# Patient Record
Sex: Male | Born: 1965
Health system: Southern US, Community
[De-identification: ages and names within clinical notes are randomized; demographics above are authoritative.]

## PROBLEM LIST (undated history)

## (undated) ENCOUNTER — Ambulatory Visit (HOSPITAL_COMMUNITY): Disposition: A | Discharge status: Home / Self Care | Payer: BC Managed Care – PPO

## (undated) DIAGNOSIS — I2510 Atherosclerotic heart disease of native coronary artery without angina pectoris: Secondary | ICD-10-CM

## (undated) DIAGNOSIS — F329 Major depressive disorder, single episode, unspecified: Secondary | ICD-10-CM

## (undated) DIAGNOSIS — F419 Anxiety disorder, unspecified: Secondary | ICD-10-CM

## (undated) DIAGNOSIS — F32A Depression, unspecified: Secondary | ICD-10-CM

## (undated) DIAGNOSIS — M1990 Unspecified osteoarthritis, unspecified site: Secondary | ICD-10-CM

## (undated) DIAGNOSIS — G44009 Cluster headache syndrome, unspecified, not intractable: Secondary | ICD-10-CM

## (undated) DIAGNOSIS — I2692 Saddle embolus of pulmonary artery without acute cor pulmonale: Secondary | ICD-10-CM

## (undated) DIAGNOSIS — I82409 Acute embolism and thrombosis of unspecified deep veins of unspecified lower extremity: Secondary | ICD-10-CM

## (undated) DIAGNOSIS — I10 Essential (primary) hypertension: Secondary | ICD-10-CM

## (undated) DIAGNOSIS — Z87442 Personal history of urinary calculi: Secondary | ICD-10-CM

## (undated) DIAGNOSIS — G4730 Sleep apnea, unspecified: Secondary | ICD-10-CM

## (undated) DIAGNOSIS — N200 Calculus of kidney: Secondary | ICD-10-CM

## (undated) DIAGNOSIS — R454 Irritability and anger: Secondary | ICD-10-CM

## (undated) DIAGNOSIS — M12819 Other specific arthropathies, not elsewhere classified, unspecified shoulder: Secondary | ICD-10-CM

## (undated) HISTORY — DX: Anxiety disorder, unspecified: F41.9

## (undated) HISTORY — DX: Saddle embolus of pulmonary artery without acute cor pulmonale: I26.92

## (undated) HISTORY — DX: Irritability and anger: R45.4

## (undated) HISTORY — DX: Calculus of kidney: N20.0

## (undated) HISTORY — DX: Cluster headache syndrome, unspecified, not intractable: G44.009

## (undated) HISTORY — PX: WISDOM TOOTH EXTRACTION: SHX21

## (undated) HISTORY — DX: Unspecified osteoarthritis, unspecified site: M19.90

## (undated) HISTORY — DX: Major depressive disorder, single episode, unspecified: F32.9

## (undated) HISTORY — DX: Essential (primary) hypertension: I10

## (undated) HISTORY — DX: Acute embolism and thrombosis of unspecified deep veins of unspecified lower extremity: I82.409

## (undated) HISTORY — PX: COLONOSCOPY: SHX174

## (undated) HISTORY — DX: Depression, unspecified: F32.A

---

## 1998-04-10 ENCOUNTER — Emergency Department (HOSPITAL_COMMUNITY): Admission: EM | Admit: 1998-04-10 | Discharge: 1998-04-10 | Payer: Self-pay | Admitting: Emergency Medicine

## 1999-11-24 ENCOUNTER — Emergency Department (HOSPITAL_COMMUNITY): Admission: EM | Admit: 1999-11-24 | Discharge: 1999-11-24 | Payer: Self-pay | Admitting: Emergency Medicine

## 2000-10-01 ENCOUNTER — Emergency Department (HOSPITAL_COMMUNITY): Admission: EM | Admit: 2000-10-01 | Discharge: 2000-10-01 | Payer: Self-pay | Admitting: Emergency Medicine

## 2000-10-01 ENCOUNTER — Encounter: Payer: Self-pay | Admitting: Emergency Medicine

## 2000-10-11 ENCOUNTER — Encounter: Payer: Self-pay | Admitting: Emergency Medicine

## 2000-10-11 ENCOUNTER — Emergency Department (HOSPITAL_COMMUNITY): Admission: EM | Admit: 2000-10-11 | Discharge: 2000-10-11 | Payer: Self-pay | Admitting: Emergency Medicine

## 2002-03-07 ENCOUNTER — Encounter: Payer: Self-pay | Admitting: Emergency Medicine

## 2002-03-07 ENCOUNTER — Observation Stay (HOSPITAL_COMMUNITY): Admission: EM | Admit: 2002-03-07 | Discharge: 2002-03-08 | Payer: Self-pay | Admitting: Emergency Medicine

## 2002-04-18 ENCOUNTER — Emergency Department (HOSPITAL_COMMUNITY): Admission: EM | Admit: 2002-04-18 | Discharge: 2002-04-18 | Payer: Self-pay | Admitting: Emergency Medicine

## 2002-08-01 ENCOUNTER — Emergency Department (HOSPITAL_COMMUNITY): Admission: EM | Admit: 2002-08-01 | Discharge: 2002-08-01 | Payer: Self-pay | Admitting: Emergency Medicine

## 2002-08-01 ENCOUNTER — Encounter: Payer: Self-pay | Admitting: *Deleted

## 2002-10-17 ENCOUNTER — Encounter: Payer: Self-pay | Admitting: Emergency Medicine

## 2002-10-17 ENCOUNTER — Emergency Department (HOSPITAL_COMMUNITY): Admission: EM | Admit: 2002-10-17 | Discharge: 2002-10-17 | Payer: Self-pay | Admitting: Emergency Medicine

## 2002-10-29 ENCOUNTER — Encounter: Payer: Self-pay | Admitting: Emergency Medicine

## 2002-10-29 ENCOUNTER — Ambulatory Visit (HOSPITAL_COMMUNITY): Admission: RE | Admit: 2002-10-29 | Discharge: 2002-10-29 | Payer: Self-pay | Admitting: Emergency Medicine

## 2004-09-08 ENCOUNTER — Emergency Department (HOSPITAL_COMMUNITY): Admission: EM | Admit: 2004-09-08 | Discharge: 2004-09-08 | Payer: Self-pay | Admitting: Emergency Medicine

## 2005-03-10 ENCOUNTER — Emergency Department (HOSPITAL_COMMUNITY): Admission: EM | Admit: 2005-03-10 | Discharge: 2005-03-10 | Payer: Self-pay | Admitting: Emergency Medicine

## 2005-04-26 ENCOUNTER — Emergency Department (HOSPITAL_COMMUNITY): Admission: EM | Admit: 2005-04-26 | Discharge: 2005-04-26 | Payer: Self-pay | Admitting: Emergency Medicine

## 2005-06-02 DIAGNOSIS — M12819 Other specific arthropathies, not elsewhere classified, unspecified shoulder: Secondary | ICD-10-CM

## 2005-06-02 HISTORY — PX: CARDIAC CATHETERIZATION: SHX172

## 2005-06-02 HISTORY — PX: ROTATOR CUFF REPAIR: SHX139

## 2005-06-02 HISTORY — DX: Other specific arthropathies, not elsewhere classified, unspecified shoulder: M12.819

## 2005-06-02 HISTORY — PX: UPPER GASTROINTESTINAL ENDOSCOPY: SHX188

## 2005-09-26 ENCOUNTER — Emergency Department (HOSPITAL_COMMUNITY): Admission: EM | Admit: 2005-09-26 | Discharge: 2005-09-26 | Payer: Self-pay | Admitting: Emergency Medicine

## 2005-09-30 ENCOUNTER — Emergency Department (HOSPITAL_COMMUNITY): Admission: EM | Admit: 2005-09-30 | Discharge: 2005-09-30 | Payer: Self-pay | Admitting: Emergency Medicine

## 2005-10-18 ENCOUNTER — Emergency Department (HOSPITAL_COMMUNITY): Admission: EM | Admit: 2005-10-18 | Discharge: 2005-10-19 | Payer: Self-pay | Admitting: Emergency Medicine

## 2005-11-04 ENCOUNTER — Ambulatory Visit: Payer: Self-pay | Admitting: Internal Medicine

## 2005-11-18 ENCOUNTER — Ambulatory Visit: Payer: Self-pay

## 2005-12-12 ENCOUNTER — Ambulatory Visit: Payer: Self-pay | Admitting: Internal Medicine

## 2005-12-25 ENCOUNTER — Observation Stay (HOSPITAL_COMMUNITY): Admission: EM | Admit: 2005-12-25 | Discharge: 2005-12-26 | Payer: Self-pay | Admitting: Emergency Medicine

## 2005-12-26 ENCOUNTER — Ambulatory Visit: Payer: Self-pay | Admitting: Cardiology

## 2005-12-31 ENCOUNTER — Ambulatory Visit: Payer: Self-pay | Admitting: Internal Medicine

## 2006-01-12 ENCOUNTER — Emergency Department (HOSPITAL_COMMUNITY): Admission: EM | Admit: 2006-01-12 | Discharge: 2006-01-12 | Payer: Self-pay | Admitting: Emergency Medicine

## 2006-01-14 ENCOUNTER — Ambulatory Visit: Payer: Self-pay | Admitting: Internal Medicine

## 2006-01-21 ENCOUNTER — Ambulatory Visit: Payer: Self-pay | Admitting: Gastroenterology

## 2006-01-26 ENCOUNTER — Ambulatory Visit: Payer: Self-pay | Admitting: Gastroenterology

## 2006-01-28 ENCOUNTER — Ambulatory Visit: Payer: Self-pay | Admitting: Cardiology

## 2006-01-29 ENCOUNTER — Ambulatory Visit: Payer: Self-pay | Admitting: Cardiology

## 2006-01-30 ENCOUNTER — Ambulatory Visit: Payer: Self-pay | Admitting: Internal Medicine

## 2006-01-30 ENCOUNTER — Inpatient Hospital Stay (HOSPITAL_BASED_OUTPATIENT_CLINIC_OR_DEPARTMENT_OTHER): Admission: RE | Admit: 2006-01-30 | Discharge: 2006-01-30 | Payer: Self-pay | Admitting: Internal Medicine

## 2006-02-03 ENCOUNTER — Ambulatory Visit: Payer: Self-pay | Admitting: Internal Medicine

## 2006-02-18 ENCOUNTER — Ambulatory Visit: Payer: Self-pay | Admitting: Cardiology

## 2006-02-27 ENCOUNTER — Emergency Department (HOSPITAL_COMMUNITY): Admission: EM | Admit: 2006-02-27 | Discharge: 2006-02-27 | Payer: Self-pay | Admitting: *Deleted

## 2006-03-03 ENCOUNTER — Ambulatory Visit: Payer: Self-pay | Admitting: Internal Medicine

## 2006-03-10 ENCOUNTER — Ambulatory Visit: Payer: Self-pay | Admitting: Gastroenterology

## 2006-03-16 ENCOUNTER — Ambulatory Visit: Payer: Self-pay | Admitting: Cardiology

## 2006-04-06 ENCOUNTER — Ambulatory Visit (HOSPITAL_COMMUNITY): Admission: RE | Admit: 2006-04-06 | Discharge: 2006-04-06 | Payer: Self-pay | Admitting: Gastroenterology

## 2006-06-28 ENCOUNTER — Emergency Department (HOSPITAL_COMMUNITY): Admission: EM | Admit: 2006-06-28 | Discharge: 2006-06-29 | Payer: Self-pay | Admitting: Emergency Medicine

## 2006-08-24 ENCOUNTER — Ambulatory Visit: Payer: Self-pay | Admitting: Internal Medicine

## 2007-01-12 ENCOUNTER — Ambulatory Visit: Payer: Self-pay | Admitting: Internal Medicine

## 2007-01-12 DIAGNOSIS — R079 Chest pain, unspecified: Secondary | ICD-10-CM | POA: Insufficient documentation

## 2007-01-21 ENCOUNTER — Encounter (INDEPENDENT_AMBULATORY_CARE_PROVIDER_SITE_OTHER): Payer: Self-pay | Admitting: *Deleted

## 2007-01-21 LAB — CONVERTED CEMR LAB
Chloride: 107 meq/L (ref 96–112)
Creatinine, Ser: 0.9 mg/dL (ref 0.4–1.5)
Potassium: 4.3 meq/L (ref 3.5–5.1)

## 2007-01-28 ENCOUNTER — Ambulatory Visit: Payer: Self-pay | Admitting: Internal Medicine

## 2007-01-28 DIAGNOSIS — N529 Male erectile dysfunction, unspecified: Secondary | ICD-10-CM | POA: Insufficient documentation

## 2007-01-28 DIAGNOSIS — F911 Conduct disorder, childhood-onset type: Secondary | ICD-10-CM | POA: Insufficient documentation

## 2007-02-01 ENCOUNTER — Encounter: Payer: Self-pay | Admitting: Internal Medicine

## 2007-02-01 LAB — CONVERTED CEMR LAB: Testosterone: 244.38 ng/dL — ABNORMAL LOW (ref 350.00–890)

## 2007-02-02 ENCOUNTER — Telehealth (INDEPENDENT_AMBULATORY_CARE_PROVIDER_SITE_OTHER): Payer: Self-pay | Admitting: *Deleted

## 2007-02-02 ENCOUNTER — Encounter (INDEPENDENT_AMBULATORY_CARE_PROVIDER_SITE_OTHER): Payer: Self-pay | Admitting: *Deleted

## 2007-02-03 ENCOUNTER — Ambulatory Visit: Payer: Self-pay | Admitting: Internal Medicine

## 2007-02-17 ENCOUNTER — Ambulatory Visit: Payer: Self-pay

## 2007-02-17 ENCOUNTER — Encounter: Payer: Self-pay | Admitting: Internal Medicine

## 2007-02-17 ENCOUNTER — Encounter: Payer: Self-pay | Admitting: Cardiology

## 2007-02-22 ENCOUNTER — Encounter: Payer: Self-pay | Admitting: Internal Medicine

## 2007-03-09 ENCOUNTER — Ambulatory Visit: Payer: Self-pay | Admitting: Internal Medicine

## 2007-03-09 DIAGNOSIS — D1779 Benign lipomatous neoplasm of other sites: Secondary | ICD-10-CM | POA: Insufficient documentation

## 2007-03-31 ENCOUNTER — Ambulatory Visit: Payer: Self-pay | Admitting: Cardiology

## 2007-07-16 ENCOUNTER — Ambulatory Visit: Payer: Self-pay | Admitting: Internal Medicine

## 2007-07-16 DIAGNOSIS — M545 Low back pain, unspecified: Secondary | ICD-10-CM | POA: Insufficient documentation

## 2007-07-29 ENCOUNTER — Telehealth: Payer: Self-pay | Admitting: Internal Medicine

## 2007-07-29 ENCOUNTER — Encounter: Payer: Self-pay | Admitting: Internal Medicine

## 2007-07-30 ENCOUNTER — Telehealth (INDEPENDENT_AMBULATORY_CARE_PROVIDER_SITE_OTHER): Payer: Self-pay | Admitting: *Deleted

## 2007-08-03 ENCOUNTER — Telehealth (INDEPENDENT_AMBULATORY_CARE_PROVIDER_SITE_OTHER): Payer: Self-pay | Admitting: *Deleted

## 2007-08-03 ENCOUNTER — Encounter (INDEPENDENT_AMBULATORY_CARE_PROVIDER_SITE_OTHER): Payer: Self-pay | Admitting: *Deleted

## 2007-08-05 ENCOUNTER — Encounter (INDEPENDENT_AMBULATORY_CARE_PROVIDER_SITE_OTHER): Payer: Self-pay | Admitting: *Deleted

## 2007-09-09 ENCOUNTER — Emergency Department (HOSPITAL_COMMUNITY): Admission: EM | Admit: 2007-09-09 | Discharge: 2007-09-09 | Payer: Self-pay | Admitting: Emergency Medicine

## 2007-09-19 ENCOUNTER — Emergency Department (HOSPITAL_COMMUNITY): Admission: EM | Admit: 2007-09-19 | Discharge: 2007-09-20 | Payer: Self-pay | Admitting: Emergency Medicine

## 2007-09-20 ENCOUNTER — Encounter: Payer: Self-pay | Admitting: Internal Medicine

## 2007-11-24 ENCOUNTER — Telehealth (INDEPENDENT_AMBULATORY_CARE_PROVIDER_SITE_OTHER): Payer: Self-pay | Admitting: *Deleted

## 2008-02-04 ENCOUNTER — Telehealth (INDEPENDENT_AMBULATORY_CARE_PROVIDER_SITE_OTHER): Payer: Self-pay | Admitting: *Deleted

## 2008-02-08 ENCOUNTER — Telehealth (INDEPENDENT_AMBULATORY_CARE_PROVIDER_SITE_OTHER): Payer: Self-pay | Admitting: *Deleted

## 2008-02-21 ENCOUNTER — Emergency Department (HOSPITAL_COMMUNITY): Admission: EM | Admit: 2008-02-21 | Discharge: 2008-02-21 | Payer: Self-pay | Admitting: Emergency Medicine

## 2008-06-29 ENCOUNTER — Telehealth (INDEPENDENT_AMBULATORY_CARE_PROVIDER_SITE_OTHER): Payer: Self-pay | Admitting: *Deleted

## 2008-07-06 ENCOUNTER — Ambulatory Visit: Payer: Self-pay | Admitting: Internal Medicine

## 2008-07-06 DIAGNOSIS — I10 Essential (primary) hypertension: Secondary | ICD-10-CM | POA: Insufficient documentation

## 2008-07-06 DIAGNOSIS — E782 Mixed hyperlipidemia: Secondary | ICD-10-CM | POA: Insufficient documentation

## 2008-07-11 ENCOUNTER — Telehealth (INDEPENDENT_AMBULATORY_CARE_PROVIDER_SITE_OTHER): Payer: Self-pay | Admitting: *Deleted

## 2008-07-14 ENCOUNTER — Telehealth (INDEPENDENT_AMBULATORY_CARE_PROVIDER_SITE_OTHER): Payer: Self-pay | Admitting: *Deleted

## 2008-07-17 ENCOUNTER — Telehealth (INDEPENDENT_AMBULATORY_CARE_PROVIDER_SITE_OTHER): Payer: Self-pay | Admitting: *Deleted

## 2008-07-26 ENCOUNTER — Telehealth (INDEPENDENT_AMBULATORY_CARE_PROVIDER_SITE_OTHER): Payer: Self-pay | Admitting: *Deleted

## 2008-10-12 ENCOUNTER — Telehealth (INDEPENDENT_AMBULATORY_CARE_PROVIDER_SITE_OTHER): Payer: Self-pay | Admitting: *Deleted

## 2008-11-03 ENCOUNTER — Ambulatory Visit: Payer: Self-pay | Admitting: Internal Medicine

## 2008-11-03 LAB — CONVERTED CEMR LAB
BUN: 16 mg/dL (ref 6–23)
Hgb A1c MFr Bld: 6.1 % (ref 4.6–6.5)

## 2008-11-06 ENCOUNTER — Encounter: Payer: Self-pay | Admitting: Internal Medicine

## 2008-11-10 ENCOUNTER — Ambulatory Visit: Payer: Self-pay | Admitting: Internal Medicine

## 2008-11-10 DIAGNOSIS — R739 Hyperglycemia, unspecified: Secondary | ICD-10-CM | POA: Insufficient documentation

## 2008-11-10 LAB — CONVERTED CEMR LAB
Cholesterol, target level: 200 mg/dL
LDL Goal: 120 mg/dL

## 2008-12-10 ENCOUNTER — Emergency Department (HOSPITAL_COMMUNITY): Admission: EM | Admit: 2008-12-10 | Discharge: 2008-12-10 | Payer: Self-pay | Admitting: Emergency Medicine

## 2009-01-19 ENCOUNTER — Encounter (INDEPENDENT_AMBULATORY_CARE_PROVIDER_SITE_OTHER): Payer: Self-pay | Admitting: *Deleted

## 2009-01-19 ENCOUNTER — Ambulatory Visit: Payer: Self-pay | Admitting: Internal Medicine

## 2009-01-19 DIAGNOSIS — T887XXA Unspecified adverse effect of drug or medicament, initial encounter: Secondary | ICD-10-CM | POA: Insufficient documentation

## 2009-01-23 ENCOUNTER — Telehealth: Payer: Self-pay | Admitting: Internal Medicine

## 2009-01-23 LAB — CONVERTED CEMR LAB
AST: 20 units/L (ref 0–37)
Cholesterol: 126 mg/dL (ref 0–200)
Hgb A1c MFr Bld: 6.1 % (ref 4.6–6.5)
LDL Cholesterol: 84 mg/dL (ref 0–99)
VLDL: 7.8 mg/dL (ref 0.0–40.0)

## 2009-01-24 ENCOUNTER — Encounter (INDEPENDENT_AMBULATORY_CARE_PROVIDER_SITE_OTHER): Payer: Self-pay | Admitting: *Deleted

## 2009-01-29 ENCOUNTER — Telehealth (INDEPENDENT_AMBULATORY_CARE_PROVIDER_SITE_OTHER): Payer: Self-pay | Admitting: *Deleted

## 2009-01-30 ENCOUNTER — Telehealth (INDEPENDENT_AMBULATORY_CARE_PROVIDER_SITE_OTHER): Payer: Self-pay | Admitting: *Deleted

## 2009-06-21 ENCOUNTER — Ambulatory Visit: Payer: Self-pay | Admitting: Internal Medicine

## 2009-06-21 LAB — CONVERTED CEMR LAB
Bilirubin Urine: NEGATIVE
Blood in Urine, dipstick: NEGATIVE
Glucose, Urine, Semiquant: NEGATIVE
Ketones, urine, test strip: NEGATIVE
Nitrite: NEGATIVE
Protein, U semiquant: NEGATIVE
Specific Gravity, Urine: 1.02
Urobilinogen, UA: NEGATIVE
WBC Urine, dipstick: NEGATIVE
pH: 5

## 2009-06-22 LAB — CONVERTED CEMR LAB
ALT: 43 units/L (ref 0–53)
AST: 23 units/L (ref 0–37)
Alkaline Phosphatase: 44 units/L (ref 39–117)
BUN: 10 mg/dL (ref 6–23)
Cholesterol: 139 mg/dL (ref 0–200)
HDL: 37.4 mg/dL — ABNORMAL LOW (ref >39.00)
LDL Cholesterol: 90 mg/dL (ref 0–99)
Total Bilirubin: 1 mg/dL (ref 0.3–1.2)
Total CHOL/HDL Ratio: 4
Triglycerides: 58 mg/dL (ref 0.0–149.0)
VLDL: 11.6 mg/dL (ref 0.0–40.0)

## 2009-07-09 ENCOUNTER — Emergency Department (HOSPITAL_COMMUNITY): Admission: EM | Admit: 2009-07-09 | Discharge: 2009-07-09 | Payer: Self-pay | Admitting: Emergency Medicine

## 2009-11-15 ENCOUNTER — Emergency Department (HOSPITAL_COMMUNITY): Admission: EM | Admit: 2009-11-15 | Discharge: 2009-11-15 | Payer: Self-pay | Admitting: Family Medicine

## 2009-11-19 ENCOUNTER — Emergency Department (HOSPITAL_COMMUNITY): Admission: EM | Admit: 2009-11-19 | Discharge: 2009-11-19 | Payer: Self-pay | Admitting: Emergency Medicine

## 2010-01-07 ENCOUNTER — Telehealth (INDEPENDENT_AMBULATORY_CARE_PROVIDER_SITE_OTHER): Payer: Self-pay | Admitting: *Deleted

## 2010-01-24 ENCOUNTER — Emergency Department (HOSPITAL_COMMUNITY): Admission: EM | Admit: 2010-01-24 | Discharge: 2010-01-24 | Payer: Self-pay | Admitting: Emergency Medicine

## 2010-06-04 ENCOUNTER — Ambulatory Visit: Admit: 2010-06-04 | Payer: Self-pay | Admitting: Internal Medicine

## 2010-06-23 ENCOUNTER — Encounter: Payer: Self-pay | Admitting: Cardiology

## 2010-07-02 NOTE — Assessment & Plan Note (Signed)
Summary: prostate exam   Vital Signs:  Patient profile:   45 year old male Weight:      234.8 pounds Temp:     97.8 degrees F oral Pulse rate:   64 / minute Resp:     15 per minute BP sitting:   114 / 80  (left arm) Cuff size:   large  Vitals Entered By: Shonna Chock (June 21, 2009 11:21 AM) CC: Patient would like a prostate exam, Hypertension Management Comments REVIEWED MED LIST, PATIENT AGREED DOSE AND INSTRUCTION CORRECT    CC:  Patient would like a prostate exam and Hypertension Management.  History of Present Illness: He thought the prostate should be examined regularly. He has no PMH of GU  disease. No FH of prostate CA. Some ED ; no decreased libido. BP not checked.  Hypertension History:      He complains of chest pain and peripheral edema, but denies headache, palpitations, dyspnea with exertion, orthopnea, PND, visual symptoms, neurologic problems, syncope, and side effects from treatment.  He notes no problems with any antihypertensive medication side effects.  Chest pain is unchanged ; it has been evaluated extensively including cath.        Positive major cardiovascular risk factors include hyperlipidemia and hypertension.  Negative major cardiovascular risk factors include male age less than 37 years old, no history of diabetes, negative family history for ischemic heart disease, and non-tobacco-user status.        Further assessment for target organ damage reveals no history of ASHD, stroke/TIA, or peripheral vascular disease.     Allergies (verified): No Known Drug Allergies  Review of Systems General:  Denies chills, fever, sweats, and weight loss. Eyes:  Denies blurring, double vision, and vision loss-both eyes. ENT:  [place] epistaxis. CV:  Complains of swelling of feet; denies leg cramps with exertion. GU:  Complains of erectile dysfunction and nocturia; denies decreased libido, discharge, dysuria, genital sores, hematuria, urinary frequency, and urinary  hesitancy; nOCTURIA 2-3x/ NIGHT. MS:  Complains of joint pain; denies joint redness and joint swelling; Knee pain; no Rx. S/P steroid njections. Derm:  Denies poor wound healing. Neuro:  Denies numbness and tingling. Endo:  Denies excessive hunger, excessive thirst, and excessive urination.  Physical Exam  General:  well-nourished,in no acute distress; alert,appropriate and cooperative throughout examination Neck:  No deformities, masses, or tenderness noted. Lungs:  Normal respiratory effort, chest expands symmetrically. Lungs are clear to auscultation, no crackles or wheezes. Heart:  Normal rate and regular rhythm. S1 and S2 normal without gallop, murmur, click, rub . S4 Abdomen:  Bowel sounds positive,abdomen soft and non-tender without masses, organomegaly or hernias noted. Rectal:  No external abnormalities noted. Normal sphincter tone. No rectal masses or tenderness. Genitalia:  Testes bilaterally descended without nodularity, tenderness or masses. No scrotal masses or lesions. No penis lesions or urethral discharge.L varicocele.   Prostate:  Prostate gland firm and smooth, no enlargement, nodularity, tenderness, mass, asymmetry or induration. Pulses:  R and L carotid,radial,dorsalis pedis and posterior tibial pulses are full and equal bilaterally Extremities:  No clubbing, cyanosis, edema. Pes planus Neurologic:  alert & oriented X3 and DTRs symmetrical and normal.   Skin:  Intact without suspicious lesions or rashes Psych:  flat affect and subdued.     Impression & Recommendations:  Problem # 1:  HYPERTENSION (ICD-401.9)  His updated medication list for this problem includes:    Verapamil Hcl Cr 180 Mg Cp24 (Verapamil hcl) .[name]... 1 bid    Lisinopril  20 Mg Tabs (Lisinopril) .[name] KitchenMarland KitchenMarland KitchenMarland Kitchen 1bid    Hydrochlorothiazide 12.5 Mg Caps (Hydrochlorothiazide) .[name]... 1 once daily  Orders: Venipuncture (16109) TLB-Creatinine, Blood (82565-CREA) TLB-Potassium (K+) (84132-K) TLB-BUN (Urea  Nitrogen) (84520-BUN)  Problem # 2:  HYPERLIPIDEMIA (ICD-272.4)  His updated medication list for this problem includes:    Pravastatin Sodium 20 Mg Tabs (Pravastatin sodium) .[name]... 1 at bedtime  Orders: Venipuncture (60454) TLB-Lipid Panel (80061-LIPID) TLB-Hepatic/Liver Function Pnl (80076-HEPATIC)  Problem # 3:  FASTING HYPERGLYCEMIA (ICD-790.29)  Orders: Venipuncture (09811) TLB-A1C / Hgb A1C (Glycohemoglobin) (83036-A1C)  Problem # 4:  ERECTILE DYSFUNCTION, ORGANIC (ICD-607.84)  His updated medication list for this problem includes:    Levitra 20 Mg Tabs (Vardenafil hcl)  Orders: UA Dipstick W/ Micro (manual) (91478) Venipuncture (29562) TLB-TSH (Thyroid Stimulating Hormone) (84443-TSH) TLB-A1C / Hgb A1C (Glycohemoglobin) (83036-A1C) TLB-Testosterone, Total (84403-TESTO)  Complete Medication List: 1)  Verapamil Hcl Cr 180 Mg Cp24 (Verapamil hcl) .[name].. 1 bid 2)  Lisinopril 20 Mg Tabs (Lisinopril) .[name].. 1bid 3)  Levitra 20 Mg Tabs (Vardenafil hcl) 4)  Hydrochlorothiazide 12.5 Mg Caps (Hydrochlorothiazide) .[name].. 1 once daily 5)  Pravastatin Sodium 20 Mg Tabs (Pravastatin sodium) .[name].. 1 at bedtime  Hypertension Assessment/Plan:      The patient's hypertensive risk group is category B: At least one risk factor (excluding diabetes) with no target organ damage.  His calculated 10 year risk of coronary heart disease is 4 %.  Today's blood pressure is 114/80.    Patient Instructions: 1)  Check your Blood Pressure regularly. If it is above:  135/85 ON AVERAGE you should make an appointment.  Laboratory Results   Urine Tests   Date/Time Reported: June 21, 2009 11:17 AM   Routine Urinalysis   Color: yellow Appearance: Clear Glucose: negative   (Normal Range: Negative) Bilirubin: negative   (Normal Range: Negative) Ketone: negative   (Normal Range: Negative) Spec. Gravity: 1.020   (Normal Range: 1.003-1.035) Blood: negative   (Normal Range: Negative) pH: 5.0   (Normal  Range: 5.0-8.0) Protein: negative   (Normal Range: Negative) Urobilinogen: negative   (Normal Range: 0-1) Nitrite: negative   (Normal Range: Negative) Leukocyte Esterace: negative   (Normal Range: Negative)    Comments: Floydene Flock  June 21, 2009 11:17 AM

## 2010-07-02 NOTE — Progress Notes (Signed)
Summary: refill  Phone Note Refill Request Call back at [phone] Message from:  Patient  Refills Requested: Medication #1:  VERAPAMIL HCL CR 180 MG  CP24 1 bid  Medication #2:  LISINOPRIL 20 MG  TABS 1bid patient using new pharmacy - [facility] [place]  Initial call taken by: Okey Regal [place],  January 07, 2010 3:33 PM    Prescriptions: LISINOPRIL 20 MG  TABS (LISINOPRIL) 1bid  #180 x 2   Entered by:   Shonna Chock CMA   Authorized by:   Marga Melnick MD   Signed by:   Shonna Chock CMA on 01/07/2010   Method used:   Electronically to        [facility] [phone]* (retail)       [address]       [place], [place]  [address]       Ph: [phone]       Fax: [phone]   RxID:   [id number] VERAPAMIL HCL CR 180 MG  CP24 (VERAPAMIL HCL) 1 bid  #180 x 2   Entered by:   Shonna Chock CMA   Authorized by:   Marga Melnick MD   Signed by:   Shonna Chock CMA on 01/07/2010   Method used:   Electronically to        [facility] [phone]* (retail)       [address]       [place], [place]  [address]       Ph: [phone]       Fax: [phone]   RxID:   [id number]

## 2010-08-18 LAB — HEMOCCULT GUIAC POC 1CARD (OFFICE): Fecal Occult Bld: NEGATIVE

## 2010-09-08 LAB — COMPREHENSIVE METABOLIC PANEL
ALT: 34 U/L (ref 0–53)
AST: 22 U/L (ref 0–37)
Albumin: 3.7 g/dL (ref 3.5–5.2)
BUN: 17 mg/dL (ref 6–23)
CO2: 25 mEq/L (ref 19–32)
Calcium: 9.2 mg/dL (ref 8.4–10.5)
Chloride: 103 mEq/L (ref 96–112)
Creatinine, Ser: 0.93 mg/dL (ref 0.4–1.5)
GFR calc Af Amer: >60 mL/min (ref >60)
GFR calc non Af Amer: >60 mL/min (ref >60)
Glucose, Bld: 115 mg/dL — ABNORMAL HIGH (ref 70–99)
Sodium: 137 mEq/L (ref 135–145)
Total Bilirubin: 0.5 mg/dL (ref 0.3–1.2)

## 2010-09-08 LAB — DIFFERENTIAL
Basophils Absolute: 0.1 10*3/uL (ref 0.0–0.1)
Basophils Relative: 1 % (ref 0–1)
Eosinophils Relative: 3 % (ref 0–5)
Neutrophils Relative %: 66 % (ref 43–77)

## 2010-09-08 LAB — LIPASE, BLOOD: Lipase: 34 U/L (ref 11–59)

## 2010-09-08 LAB — CBC
Hemoglobin: 14.5 g/dL (ref 13.0–17.0)
RDW: 13.1 % (ref 11.5–15.5)

## 2010-09-08 LAB — RAPID URINE DRUG SCREEN, HOSP PERFORMED
Opiates: NOT DETECTED
Tetrahydrocannabinol: NOT DETECTED

## 2010-10-15 NOTE — Assessment & Plan Note (Signed)
[facility]                                 ON-CALL NOTE   NAME:[name], [name]                   MRN:          [id number]  DATE:01/29/2007                            DOB:          06/11/1965    [phone]   Patient of Dr.  Frederik Pear.   Patient calling because Dr. Alwyn Ren wrote him a prescription for Levitra  but did not put his name on the prescription, the pharmacy will not fill  it.  Call Dr. Alwyn Ren on Tuesday.     Jeffrey A. Tawanna Cooler, MD  Electronically Signed    JAT/MedQ  DD: 01/30/2007  DT: 01/31/2007  Job #: [id number]

## 2010-10-15 NOTE — Assessment & Plan Note (Signed)
[facility]                            CARDIOLOGY OFFICE NOTE   NAME:[name], [name]                   MRN:          [id number]  DATE:02/03/2007                            DOB:          1965/12/08    This is a 45 year old African-American male patient of Dr. Jens Som who  has a history of atypical chest pain.  He underwent cardiac  catheterization by Dr. Gala Romney on January 30, 2006, which revealed no  obstructive coronary artery disease; however, there was TIMI II flow on  a PDA, consistent with possible microvascular disease.  He was asked to  continue on his verapamil.  His ejection fraction was 47% with mild  global hypokinesis that was felt related to alcohol use in the past.  He  was placed on lisinopril for this.   The patient presents with a 2-3 week history of recurrent, sharp,  shooting left-sided chest pain that goes up into his neck and is  proceeded by a tightening.  These episodes last off and on for 10  minutes and abate and return for a total of 30 minutes.  He denies any  associated shortness of breath, palpitations, dizziness, or presyncope.  They are not induced by exercise.  He usually has them while sitting  down, and yesterday he had one while he was driving.  He did not pull  over and stop the car, it was not severe enough, and proceeded home.  He  currently is on disability because of left shoulder surgery, and is out  for another month.  He denies any alcohol use or abuse and is a  nonsmoker.  He has no history of cholesterol problems.  He says his  brother has some heart problems and was asked about getting a  defibrillator but denied it.   CURRENT MEDICATIONS:  1. Verapamil 180 mg b.i.d.  2. Hydrocodone 7.5 p.r.n.  3. Buspirone/HCL 7.5 mg b.i.d.  4. Lisinopril 20 mg b.i.d.   PHYSICAL EXAMINATION:  This is a pleasant 45 year old African-American  male who is overweight.  Blood pressure is 148/80, pulse 87, weight  241.  NECK:  Without JVD, HJR, bruit, or thyroid enlargement.  LUNGS:  Clear, anterior, posterior, and lateral.  HEART:  Regular rate and rhythm at 87 beats per minute.  Normal S1 and  S2.  Positive S4 gallop.  No murmur, rub, bruit, thrill, or heave noted.  ABDOMEN:  Soft without organomegaly, masses, lesions, or abnormal  tenderness.  EXTREMITIES:  Without clubbing, cyanosis or edema.  There are good  distal pulses.   EKG:  Normal sinus rhythm.  No acute change.   IMPRESSION:  1. Ongoing atypical chest pain.  2. Hypertension.  3. Question of microvascular disease with TIMI II flow on posterior      descending artery on cardiac catheterization in August, 2007,      otherwise nonobstructive coronary artery disease.  4. Mild left ventricular dysfunction, felt related to alcohol use in      the past. Ejection fraction 47%.  5. History of cluster headaches.  6. History of mildly elevated liver functions with SGPT of  52.  7. Recent left shoulder surgery.   PLAN:  Dr. Ludwig Clarks note had recommended follow-up 2D echo within a  year, which is due now.  Because of his recurrent symptoms, I will also  order a stress Myoview.  He will see Dr. Jens Som back after these tests  are performed within the next month.      Jacolyn Reedy, PA-C  Electronically Signed      Bevelyn Buckles. Bensimhon, MD  Electronically Signed   ML/MedQ  DD: 02/03/2007  DT: 02/03/2007  Job #: [id number]   cc:   Titus Dubin. Alwyn Ren, MD,FACP,FCCP

## 2010-10-15 NOTE — Assessment & Plan Note (Signed)
[facility]                            CARDIOLOGY OFFICE NOTE   NAME:Loh, JUANJOSE MOJICA                   MRN:          [id number]  DATE:03/31/2007                            DOB:          Aug 29, 1965    Mr. Smelser is a 45 year old gentleman who I have seen in the past for  atypical chest pain.  At catheterization in August 2007 revealed no  obstructive coronary disease.  There was TIMI 2 flow in the PDA with  questioned possible microvascular disease.  His ejection fraction was  47%.  He was placed on Verapamil and an ACE inhibitor for his blood  pressure and for possible microvascular disease.  He has had persistent  atypical chest pain.  He does have GI evaluation that apparently has  been unremarkable.  He recently saw Wende Bushy in the clinic and was  complaining of chest pain.  Myoview was ordered which was performed On  February 17, 2007.  He did have chest pain with exercise but there were  no ST changes and his perfusion was normal.  He also had a followup  echocardiogram on February 17, 2007 that showed normal LV function.  There was mild mitral regurgitation.  There was also mild tricuspid  regurgitation.  Since that time he continues to have chest pain.  It is  diffuse across his chest.  There is also a sharp sensation that shoots  up to my shoulder on the left.  There is no associated shortness of  breath or nausea or vomiting.  Pain is not exertional nor is pleuritic.  Note he also complains of some cramping in his sides when he turns in  certain ways.   MEDICATIONS:  1. Verapamil 180 mg p.o. b.i.d.  2. Hydrocodone as needed.  3. Lisinopril 20 mg p.o. b.i.d.   PHYSICAL EXAMINATION:  Shows a blood pressure 138/90.  His pulse is 70.  HEENT:  Normal.  NECK:  Supple.  CHEST:  Clear.  CARDIOVASCULAR:  Reveals a regular rate and rhythm.  There are no  murmurs, rubs or gallops.  ABDOMINAL:  Shows no tenderness.  EXTREMITIES:   Show no edema.   His electrocardiogram shows a sinus rhythm at a rate of 70.  There were  no ST changes noted.   DIAGNOSES:  1. Continued atypical chest pain -- His previous catheterization      showed no coronary disease and his recent Myoview and      echocardiogram were normal.  I do not think this is cardiovascular      in etiology.  He will continue on his present medications and I      have asked him to follow up with Dr. Alwyn Ren for workup of non-      cardiac chest pain.  2. Hypertension -- His diastolic blood pressure is minimally elevated.      This will need to be tracked and his medications can be adjusted by      Dr. Alwyn Ren as needed.  3. History of cluster headaches.  4. History of mildly elevated liver function with SGPT of 52 -- Will  leave this to Dr. Alwyn Ren.   We will see him back on an as needed basis.     Madolyn Frieze Jens Som, MD, [facility]  Electronically Signed    BSC/MedQ  DD: 03/31/2007  DT: 03/31/2007  Job #: [id number]   cc:   Titus Dubin. Alwyn Ren, MD,FACP,FCCP

## 2010-10-18 NOTE — Discharge Summary (Signed)
NAME:  [name], [name]                      ACCOUNT NO.:  [id number]   MEDICAL RECORD NO.:  [id number]                   PATIENT TYPE:  INP   LOCATION:  4731                                 FACILITY:  [facility]   PHYSICIAN:  Madolyn Frieze. Jens Som, M.D. [facility]         DATE OF BIRTH:  01/24/1966   DATE OF ADMISSION:  03/07/2002  DATE OF DISCHARGE:  03/08/2002                           DISCHARGE SUMMARY - REFERRING   PROCEDURE:  None.   HOSPITAL COURSE:  The patient is a 45 year old male with no known history of  coronary artery disease.  He describes a five year history of chest pain  that starts on the left side and is described as a sharp feeling and as a  tightness.  It gets up to a 6/10 and radiates to his left arm.  It may or  may not be associated with shortness of breath but no nausea, vomiting, or  diaphoresis is noted.  He was having pain as a 3/10 in the emergency room.  His initial laboratories were negative and his EKG was within normal limits.  He was admitted to rule out MI and for further evaluation.   The next day he was evaluated by Dr. Jens Som who felt that it was most  likely not ischemic.  His EKG rhythm remained normal.  He was ruled out with  three sets of cardiac enzymes and then considered stable for discharge with  an outpatient stress test and echocardiogram arranged.   Chest x-ray:  The heart size is normal and there are no active findings  noted in the lung fields.   LABORATORIES:  ph7.42, pCO2 37.5, bicarbonate 24, no O2.  Hemoglobin 18,  hematocrit 52.  Sodium 138, potassium 4.5, chloride 107, BUN 15, glucose 94.  CK-MB and troponin I negative for MI x3.  Lipid profile pending at the time  of dictation.   CONDITION ON DISCHARGE:  Stable.   DISCHARGE DIAGNOSES:  1. Chest pain, resolved.  Follow-up echocardiogram and stress Cardiolite in     the office March 08, 2002.  2. Unknown lipid status, pending at the time of dictation.  3. History of a leg  fracture.  4. History of borderline hypertension.  5. History of a broken right hand.    DISCHARGE INSTRUCTIONS:  His activity level is to be as tolerated.  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  He is to follow up with Dr. Jens Som on October 23  at 2 p.m.  He is to get an exercise Cardiolite today at 12:30 and a 2-D  echocardiogram today at 3:30.   DISCHARGE MEDICATIONS:  Baby aspirin 81 mg q.d.      Lavella Hammock, P.A. [facility]                  Madolyn Frieze. Jens Som, M.D. [facility]    RG/MEDQ  D:  03/08/2002  T:  03/09/2002  Job:  [id number]   cc:   Madolyn Frieze.  Jens Som, M.D. [facility]

## 2010-10-18 NOTE — Assessment & Plan Note (Signed)
[facility]                          [place] OFFICE NOTE   NAME:[name], [name]                   MRN:          [id number]  DATE:12/12/2005                            DOB:          May 31, 1966    [name] was seen in follow up December 12, 2005, for chest pain.  It  lasts for hours to days and is on a scale of 7 to 10.  It occurred at work.  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  He states  that he will lift up to 10,000 tubes per day.  The pain is not aggravated by  posthion change such as squatting.  It is in the center of his chest with  radiation to the left shoulder.  He describes it as as if a wall was  closing in and has occasional numbness of the left hand.  He denies nausea  or sweating.  He was seen in the emergency room  this year for the chest  pain.  He also had a nuclear stress test on June 19.  He had dizziness  during and after the exercise.  He had chest tightness while the imaging was  being completed.  There was up-sloping ST depression of 1-2 mm in the  inferior leads and V4 through V6.  This was all within the first minute.  This study is felt to be similar to that completed March 08, 2002.   An EKG on July 13 revealed nonspecific ST wave changes.  There was no pain  to palpation over the chest.  An S4 versus a click was suggested.  Homan's  sign was negative.  Skin was warm and dry.   His CPK was 248; MB was not reported as requested.  Troponin level was 0.04.  His LDL was 118 with an HDL of 37.  Hemoglobin A1c was 6.   Cardiology consultation will be requested.  With his multiple risk factors  and lack of diagnosis, a catheterization may be necessary.  He will be seen  following the cardiology consultation to address the lipid and glucose  risks.                                   Titus Dubin. Alwyn Ren, MD, [facility]   WFH/MedQ  DD:  12/18/2005  DT:  12/18/2005  Job #:  [id number]

## 2010-10-18 NOTE — Letter (Signed)
January 19, 2006      RE:  [name], [name]  MRN:  [id number]  /  DOB:  [birth year]   To Whom It May Concern:   [name] has been seen on multiple occasions for chest pain;  etiology is unclear.  He has requested an FMLA form be completed.  I  recommend to Mr. Kunzler to give him a medical leave of absence for two  weeks to allow a detailed evaluation by both cardiology and gastroenterology  subspecialists.  This would be the most appropriate avenue to pursue as the  etiology of his recurrent chest pain is unclear.   If a diagnosis is made which is appropriate for FMLA application, then the  form would be completed.    Sincerely,      Titus Dubin. Alwyn Ren, MD, FACP, [facility]   WFH/MedQ  DD:  01/19/2006  DT:  01/19/2006  Job #:  [id number]

## 2010-10-18 NOTE — Cardiovascular Report (Signed)
[name], [name]            ACCOUNT NO.:  [id number]   MEDICAL RECORD NO.:  [id number]          PATIENT TYPE:  OIB   LOCATION:  1961                         FACILITY:  [facility]   PHYSICIAN:  Bevelyn Buckles. Bensimhon, MDDATE OF BIRTH:  Oct 01, 1965   DATE OF PROCEDURE:  01/30/2006  DATE OF DISCHARGE:  01/30/2006                              CARDIAC CATHETERIZATION   PRIMARY CARE PHYSICIAN:  Dr. Marga Melnick at [facility].   CARDIOLOGIST:  Dr. Olga Millers.   PATIENT IDENTIFICATION:  [name] is a 45 year old male with a history  of obesity, borderline diabetes, hypertension and previous alcohol abuse  which he quit 3 months ago.  He has had multiple evaluations for chest pain.  He had a functional study in June which was reportedly negative with a  normal LV function.  With his ongoing symptoms, he is referred for  diagnostic angiography in the outpatient catheterization lab.   PROCEDURES PERFORMED:  1. Selective coronary angiography.  2. Left heart catheterization.  3. Left ventriculogram.  4. Abdominal aortogram to evaluate renal arteries in the setting of severe      hypertension.   DESCRIPTION OF PROCEDURE:  The risks and benefits of the catheterization  were explained; consent was signed and placed on the chart.  A 4-French  arterial sheath was placed in the right femoral artery using a modified  Seldinger technique.  Standard catheters including JL-4, 3-RC and angled  pigtail were used for procedure.  All catheterization exchanges were made  over wire; there are no apparent complications.  Central aortic pressure was  126/88 with a mean of 106.  LV pressure is 121/8 with an EDP of 16.  There  is no aortic stenosis.   Left main was long with angiographically normal.   LAD was a long vessel wrapping the apex.  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  There is no apparent  stenosis.   Left circumflex is a very large vessel, it gave off a large ramus branch and  a very large branching OM-1, there was a small OM-2, it was angiographically  normal.   Right coronary artery was a large vessel, it gave off a large branching PDA  and a moderate-sized posterolateral.  There was TIMI II flow through the PDA  but no evidence of stenosis.  The RCA was angiographically normal.   Left ventriculogram done in the RAO position showed an EF of 47% with global  mild hypokinesis.   Abdominal aortogram showed patent renal arteries bilaterally with no  abdominal aortic iliac disease.   ASSESSMENT:  1. Normal coronary arteries with distal left anterior descending      intramyocardial segment with no evidence obstruction.  2. TIMI II flow in the posterior descending artery suggestive of      microvascular disease.  3. Mild left ventricular dysfunction possibly related to poorly controlled      hypertension or his previous history of alcohol.   PLAN/DISCUSSION:  1. Continue verapamil for his microvascular disease.  Given his previous  severe headaches with nitroglycerin would not add Imdur at this time.  2. Start low-dose ACE inhibitor, lisinopril 10 mg a day for his mild LV      dysfunction.  3. He will obviously need titration of his medications to obtain tight      blood pressure control.  4. Given the initiation of an ACE inhibitor would have him check a BMET      next week with Dr. Alwyn Ren make sure his electrolytes and renal function      are stable.      Bevelyn Buckles. Bensimhon, MD  Electronically Signed     DRB/MEDQ  D:  01/30/2006  T:  01/30/2006  Job:  [id number]   cc:   Titus Dubin. Alwyn Ren, MD,FACP,FCCP  Madolyn Frieze. Jens Som, MD, [facility]

## 2010-10-18 NOTE — Assessment & Plan Note (Signed)
[facility]                           GASTROENTEROLOGY OFFICE NOTE   NAME:PHILLIPSAniel, [name]                     MRN:          [id number]  DATE:01/21/2006                            DOB:          01-Feb-1966    REFERRING PHYSICIAN:  Dr. Alwyn Ren   His primary cardiologist is Dr. Jens Som.   REASON FOR REFERRAL:  Dr. Alwyn Ren asked me to evaluate [name] regarding  chest pain, question esophageal origin.   HISTORY OF PRESENT ILLNESS:  [name] is a very pleasant 45 year old man  who has had 15 years of intermittent chest discomfort.  He describes the  pain as sometimes lightning sharp, sometimes a pressure phenomenon, usually  in his left chest.  It can radiate to the right chest at times.  It  also  can radiate down his left arm causing numbness.  He sometimes has mild  shortness of breath at the same time but usually does not have nausea.  He  has not been able to figure out any specific trigger such as laying down,  exercise, or eating.  He has no real classic GERD symptoms such as pyrosis  and has no dysphagia.  He has been worked up for cardiac causes including  stress test, EKGs, and had been seen by a cardiologist.  That workup was  essentially negative.  Yesterday he began taking a proton pump inhibitor for  the first time.  He started on Nexium 40 mg once daily.  He noticed really  no change yesterday in his symptoms.  The pain was originally happening once  every 6-12 months, but for the past year or so, it has been happening on  nearly a daily basis.   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   PAST MEDICAL HISTORY:  Hypertension, intermittent cluster headaches.   CURRENT MEDICATIONS:  Verapamil, hydrocodone, bupropion, Nexium.   ALLERGIES:  No known drug allergies.   SOCIAL HISTORY:  Works as a Location manager.  Nonsmoker, nondrinker.  Single with six  children.  Lives with a fiancee.   FAMILY HISTORY:  Mother with melanoma.  Brother with liver disease.  Brother  and aunt with diabetes.   PHYSICAL EXAMINATION:  5 foot, 6 inches.  239 pounds.  Blood pressure:  124/92.  Pulse:  64.  CONSTITUTIONAL:  Heavy but muscular-appearing young man.  NEUROLOGIC:  Alert and oriented x3.  EYES:  Extraocular movements intact.  MOUTH:  Oropharynx moist.  No lesions.  NECK:  Supple.  No adenopathy.  LUNGS:  Clear to auscultation bilaterally.  HEART:  Regular rate and rhythm.  ABDOMEN:  Soft, nontender, nondistended.  Normal bowel sounds.  EXTREMITIES:  No lower extremity edema.  SKIN:  No rashes or lesions.   ASSESSMENT/PLAN:  A 45 year old man with intermittent chest pains.   Cardiac workup has been essentially negative.  These pains may indeed be  related to GERD.  He just started on a PPI yesterday and I would not expect  to notice any change in his symptoms for at least several days to a  few  weeks even.  I think it is reasonable to keep him on his Nexium once a day.  I have reiterated the importance of taking it 20 or 30 minutes before a good  meal.  I will also arrange for him to have an EGD at his soonest  convenience.  I see no reason for any further blood test or imaging studies  prior to them.                                   Rachael Fee, MD   DPJ/MedQ  DD:  01/21/2006  DT:  01/21/2006  Job #:  [id number]

## 2010-10-18 NOTE — Assessment & Plan Note (Signed)
[facility]                              CARDIOLOGY OFFICE NOTE   NAME:[name] [name] YARDLEY                   MRN:          [id number]  DATE:01/28/2006                            DOB:          1965-08-07    HISTORY OF PRESENT ILLNESS:  [name] is a 45 year old gentleman that I  recently saw at [facility] secondary to atypical chest pain. He  ruled out for myocardial infarction with serial enzymes. It was noted that  he had a previous nuclear study in June of this year that showed normal LV  size and function. It was felt that the perfusion was probably normal,  although an minor degree of inferior ischemia could not be excluded. The  patient continues to have chest pain and is seeing a gastroenterologist as  well. The pain is in the left upper chest area and radiates to the shoulder.  It is non-exertional, nor is it pleuritic. It can increase with lifting his  arm over his head. It is not related to food. Dr. Alwyn [name] has seen the  patient and thinks that further evaluation from a cardiac standpoint is  warranted. Note, the patient states that he has been having this pain  intermittently for 15 years.   MEDICATIONS:  Include verapamil 180 mg p.o. b.i.d., hydrocodone as needed,  __________ 7.5 mg p.o. b.i.d., and Nexium.   PHYSICAL EXAMINATION:  VITAL SIGNS:  Today shows a blood pressure of 120/80  and his pulse is 83. He weighs 236 pounds.  NECK:  Supple.  CHEST:  Clear.  CARDIOVASCULAR:  Regular rate and rhythm.  EXTREMITIES:  No edema.   LABORATORY DATA:  His electrocardiogram shows a sinus rhythm at a rate of  83. There are non-specific ST changes.   DIAGNOSES:  1. Continuing atypical chest pain.  2. History of mildly elevated liver functions with SGPT of 52.  3. History of hypertension.  4. History of cluster headaches.   PLAN:  [name] continues to complain of chest pain of uncertain  etiology. It is atypical for  ischemia. Note, his previous nuclear study  showed a question of minimal ischemia in the inferior wall, although this  was felt to be a low risk study. Given the continuing nature of his  symptoms, we will schedule him to have a cardiac CT. I think his pain could  be musculoskeletal. Note that these symptoms have been intermittent for  approximately 15 years. We will see him back in approximately 4 weeks to  review his cardiac CT. I think if this is unremarkable, then no further  cardiac workup is warranted and I will ask him to followup with Dr. Alwyn [name].  He also needs to followup with Dr. Alwyn [name] for his minimally elevated liver  functions in the past.                              Madolyn [name]. [name] Som, MD, [facility]    BSC/MedQ  DD:  01/28/2006  DT:  01/28/2006  Job #:  [id number]   cc:  [name] [name]. [name] Ren, MD, FACP, [facility]

## 2010-10-18 NOTE — Assessment & Plan Note (Signed)
[facility]                           GASTROENTEROLOGY OFFICE NOTE   NAME:Rahn, DORSEY AUTHEMENT                   MRN:          [id number]  DATE:03/10/2006                            DOB:          1965-08-22    PRIMARY CARDIOLOGIST:  Dr. Olga Millers.   PRIMARY CARE PHYSICIAN:  Dr. Marga Melnick.   PROBLEM LIST:  1. Atypical chest pain.  Cardiology workup doubts cardiac source.  EGD      performed by myself August 2007 showed no esophagized or Barrett's      changes.  He has had no change in his symptoms on daily PPI therapy      Nexium.   INTERVAL HISTORY:  I last saw Mr. Foti at the time of his EGD  approximately 6-7 week ago.  There was no sign of GERD damage to his  esophagus.  I kept him on Nexium that he has been taking very regularly 20-  30 minutes prior to his breakfast meal.  He has had no change in his  symptoms at all.  Nexium has not helped.  He continues to have pain in his  left chest that seems to radiate to his right shoulder.   CURRENT MEDICATIONS:  Verapamil, Nexium, Buspirone, Lisinopril,  multivitamin, Neurontin.   PHYSICAL EXAMINATION:  Weight 231 pounds which is down 8 pounds since his  last visit, blood pressure 122/80, pulse 88.  CONSTITUTIONAL:  Generally well appearing although somewhat obese.  CHEST:  No tender spots.  LUNGS:  Clear to auscultation bilaterally.  ABDOMEN:  Soft, nontender, nondistended, normal bowel sounds.   ASSESSMENT AND PLAN:  A 45 year old man with continued atypical chest pain,  unclear source.   He has no acid damage to his esophagus and his atypical chest pains have not  responded to PPI medicines.  This argues against GERD as a source of his  discomforts.  To be sure, I will arrange for him to have a 48-hour pH  testing.  This will be done off PPI medicines.  We will also get esophageal  manometry.  Perhaps this is esophageal spasms.   He will get an esophageal manometry first.   They will be able to identify  his GE junction manometrically and we use that information for place of the  Bravo probe so that he will not need a repeat EGD with sedation.            ______________________________  Rachael Fee, MD      DPJ/MedQ  DD:  03/10/2006  DT:  03/12/2006  Job #:  [id number]   cc:   Titus Dubin. Alwyn Ren, MD,FACP,FCCP

## 2010-10-18 NOTE — H&P (Signed)
[name], [name]            ACCOUNT NO.:  [id number]   MEDICAL RECORD NO.:  [id number]          PATIENT TYPE:  EMS   LOCATION:  [facility]                         FACILITY:  [facility]   PHYSICIAN:  [name], M.D. LHCDATE OF BIRTH:  1966/03/05   DATE OF ADMISSION:  12/25/2005  DATE OF DISCHARGE:                                HISTORY & PHYSICAL   HISTORY OF PRESENT ILLNESS:  [name] is a 45 year old male with a past  medical history of hypertension and cluster headaches we are asked to  evaluate for chest pain.  He apparently has an approximately 8-10 year  history of chest pain.  He had a stress nuclear study in October 2003.  His  ejection fraction at that time was 61% with normal wall motion and there was  no scar or ischemia.  He has continued to have intermittent chest pain.  It  is in different locations on his chest but predominantly in the left upper  chest area.  It radiates to his left shoulder and left upper extremity.  He  does complain of shortness of breath with the pain but there is no nausea,  vomiting, diaphoresis.  The patient is not exertional nor is it pleuritic  and is not related to food.  It can increase with certain movements.  It has  been continuous over the past three days and he presented to the emergency  room.  We were asked to further evaluation.  Note, the patient did have a  stress Myoview in June of this year at our office, but I do not have those  results available.  He states that it was borderline.   ALLERGIES:  No known drug allergies.   CURRENT MEDICATIONS:  Verapamil 180 mg tablets, 2 p.o. daily, Hydrocodone as  needed.   SOCIAL HISTORY:  He denies any tobacco or drug use.  He does have a history  of alcohol abuse but states he has not consumed alcohol in the past one  month.   FAMILY HISTORY:  Negative for coronary artery disease in his mother or  father.  His brother has an unknown type of disease that has caused  cardiomyopathy  and strokes.   PAST MEDICAL HISTORY:  Significant for hypertension but there is no diabetes  mellitus or hyperlipidemia.  He has a history of cluster headaches.  There  is no other medical history noted.  He has had prior wisdom teeth removed  but no other surgery is noted.   REVIEW OF SYMPTOMS:  He has had a headache today because of nitroglycerin he  received in the emergency room .  There are no fevers, chills, or productive  cough.  There is no hemoptysis.  There is no dysphagia, odynophagia, melena,  or hematochezia.  There is no dysuria or hematuria.  There is no rash or  seizure activity.  There is no orthopnea, PND, or pedal edema.  The  remaining systems are negative.   PHYSICAL EXAMINATION:  VITAL SIGNS:  Blood pressure 148/84 and his pulse is 75, he is afebrile.  GENERAL:  Well developed, well nourished in no acute  distress.  SKIN:  Warm and dry.  There is no peripheral clubbing.  HEENT:  Unremarkable with normal eyelids.  NECK:  Supple with a normal upstroke bilaterally and I cannot appreciate  bruits.  There is no jugular venous distention and no thyromegaly is noted.  CHEST:  Clear to auscultation with normal expansion.  CARDIOVASCULAR:  Regular rate and rhythm with a normal S1 and S2.  There are  no murmurs, gallops, or rubs noted.  ABDOMEN:  Nontender, positive bowel sounds, no hepatosplenomegaly, no masses  appreciated.  There is no abdominal bruit.  Note, he does have some  tenderness over the left chest area.  He has 2+ femoral pulses bilaterally  and no bruits.  EXTREMITIES:  No edema, I can palpate no cords.  He has 2+ dorsalis pedes  pulses bilaterally.  NEUROLOGICAL:  Grossly intact.   LABORATORY DATA:  Sodium 142, potassium 3.7.  Hemoglobin and hematocrit are  16 and 47 respectively.  His creatinine is 0.9.  His initial cardiac markers  are negative.  His electrocardiogram shows a normal sinus rhythm at a rate  of 86.  There are nonspecific ST changes.    DIAGNOSIS:  1.  Atypical chest pain.  2.  Hypertension.  3.  History of cluster headaches.   PLAN:  [name] has been admitted with atypical chest pain.  His  symptoms are extremely atypical and they have been almost continuous for the  past three days.  His electrocardiogram is unremarkable other than  nonspecific ST changes.  Based on his description, I wonder whether he may  have musculoskeletal pain.  We will admit and rule out myocardial infarction  with serial enzymes.  He has had a Myoview on June 17 of this year per Dr.  Frederik [name] notes, but there is no mention of the results.  We will obtain  those and if his enzymes are negative and the Myoview is normal, I do not  think we need to pursue further cardiac evaluation at this point.  He will  continue on his Verapamil for his hypertension and he will follow up with  his primary care physicians for this issue and other risk factor  modifications.  We will add aspirin while he is here.  We will also schedule  him to have an outpatient echocardiogram.           ______________________________  [name], M.D. [facility]     BC/MEDQ  D:  12/25/2005  T:  12/25/2005  Job:  [id number]

## 2010-10-18 NOTE — Assessment & Plan Note (Signed)
[facility]                              CARDIOLOGY OFFICE NOTE   NAME:[name], [name] CERAMI                   MRN:          [id number]  DATE:02/18/2006                            DOB:          1965/11/23    [name] is a 45 year old gentleman who continues to have atypical chest  pain.  Please refer to my previous notes for details.  He did undergo  cardiac catheterization by Dr. Gala [name] on January 30, 2006.  There was no  obstructive coronary disease.  However, Dr. Gala [name] felt that there was  TIMI II flow in the PDA consistent with possible microvascular disease and  asked that he continue his Verapamil.  He also noted that his ejection  fraction was 47% with mild global hypokinesis.  He placed him on Lisinopril  for his mild LV dysfunction and felt that it may be related to alcohol use  in the past.  Since that time, he continues to have chest pain.  It is in  the left chest area and has been continuous for the past three days.  He  denies any dyspnea, palpitations, or syncope.   His medications at present include Verapamil 180 mg p.o. b.i.d.,  hydrocodone, Nexium 40 mg p.o. daily, Lisinopril 10 mg daily, and multi-  vitamin.   PHYSICAL EXAMINATION:  VITAL SIGNS:  Blood pressure 122/78, pulse 78.  NECK:  Supple with no bruits.  CHEST:  Clear.  CARDIOVASCULAR:  Regular rate and rhythm.  EXTREMITIES:  No edema.   DIAGNOSIS:  1. Continuing atypical chest pain.  2. Hypertension.  3. Question microvascular disease.  4. History of cluster headaches.  5. History of mildly elevated liver functions with SGPT of 52.   PLAN:  [name] continues to have chest pain that does not sound  cardiac.  I do not think we need to pursue further cardiac evaluation.  He  has a mild cardiomyopathy possibly related to his history of alcohol abuse.  We will check a ferritin and TSH for completeness.  We will also check a  BMET given the recent initiation  of his Lisinopril.  He will continue on the  Verapamil for possible microvascular dysfunction.  He will follow up with  Dr.  Alwyn [name] for further workup of his  chest pain.  He will see [place] back in one year and we will, most likely,  repeat his echocardiogram at that time.                              [name] [name] Som, MD, [facility]    BSC/MedQ  DD:  02/18/2006  DT:  02/19/2006  Job #:  [id number]   cc:   [name]. [name] Ren, MD,FACP,FCCP

## 2010-10-18 NOTE — Assessment & Plan Note (Signed)
[facility]                          [place] OFFICE NOTE   NAME:[name], [name] WEATHERALL                   MRN:          [id number]  DATE:01/14/2006                            DOB:          08/13/1965    [name] was seen January 14, 2006, for followup of chest pain.  He  was seen in urgent care for chest pain and transported by ambulance to [facility].  Cardiac enzymes there were negative.  His EKG showed nonspecific ST-T  wave changes which were a chronic finding.  This has been evaluated with a  nuclear stress test on June 19 which showed essentially no change from a  study of March 08, 2002.   He continues to have paroxysmal chest pain with some elevation of CPK, but  no MB elevations.  Additionally, his aldolase and LDH are normal mitigating  against a primary muscular disorder.   He has no associated headaches, blushing, or diarrhea with the chest pain.  Nitroglycerin caused a headache which was almost as bad as the chest pain.   He does have a hemoglobin A1c which has been 6.0% and 6.1% indicating  borderline diabetes.  He is restricting hyperglycemic carbs and high-  fructose corn syrup in packaged foods and drinks.  I will initiate metformin  1000 mg, one half daily, and request he have a hemoglobin A1c rechecked in  three months.   The frequency of chest pain has caused him to miss work and this actually is  a threat to his employment.  It is essential that we clarify this.  He has  been evaluated by [facility] while hospitalized from July 26-27 by  Dr. Olga Millers.  At this point, cardiology does not feel catheterization  is necessary, but in view of the impact on his life and work, this may be  necessary.  At the time of discharge, he was on Protonix 40 mg daily.  This  was listed in the discharge summary of July 27, but he states he was not  given a prescription for this.  He will be started on Nexium 40  mg.   I will ask for a GI consultation to rule out an esophageal etiology of the  chest pain.   A copy of this will be sent to Dr. Olga Millers as well to facilitate  continuity of care.  A copy of this will be sent to Mr. Slabach at his home  address, [address] [place], [place]  [address].  He will be  asked to carry this and share this information with any physician he sees.   This will be dictated a priority in priority format to allow completion of  the evaluation in a timely fashion.                                  Titus Dubin. Alwyn Ren, MD, FACP, [facility]   [facility]/MedQ  DD:  01/14/2006  DT:  01/14/2006  Job #:  [id number]
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
## 2010-10-18 NOTE — Discharge Summary (Signed)
NAMEBHAVESH, VAZQUEZ            ACCOUNT NO.:  [id number]   MEDICAL RECORD NO.:  [id number]          PATIENT TYPE:  INP   LOCATION:  3731                         FACILITY:  [facility]   PHYSICIAN:  Olga Millers, M.D. LHCDATE OF BIRTH:  [birth year]   DATE OF ADMISSION:  12/25/2005  DATE OF DISCHARGE:  12/26/2005                                 DISCHARGE SUMMARY   PROCEDURES:  None.   PRIMARY DIAGNOSES:  Chest pain, status post stress test November 18, 2005 with  normal left ventricular size and function with somewhat variable  diaphragmatic attenuation.  Ejection fraction 61%.   SECONDARY DIAGNOSES:  1.  Cluster headaches.  2.  Hypertension.   FAMILY HISTORY:  Family history of CVA and coronary artery disease in a  brother.   ALLERGIES:  NO KNOWN DRUG ALLERGIES.   TIME OF DISCHARGE:  31 minutes.   HOSPITAL COURSE:  Mr. Deguia is a 45 year old male with no previous  history of coronary artery disease.  He was evaluated by Dr. Alwyn Ren for  chest pain and a stress test was performed in June.  It was a low-risk  study.  He had an outpatient appointment scheduled with cardiology but his  symptoms returned and he came to the emergency room where he was admitted  for further evaluation and treatment.   Mr. Blank describes the chest pain as sharp and it radiates to his back  and to his left arm.  He also has tightness at times.  He has been having  the symptoms for several years.  He was evaluated in 2003 for these symptoms  as well.  They reach a 10/10.  They occur at rest and with exertion and are  associated with shortness of breath.  He also has nausea and vomiting  yesterday but no diarrhea.  There is no diaphoresis.  Over the last two days  prior to admission the symptoms have been off and on all day long.  Nitroglycerin sublingual gave him one of his cluster headaches and some  blurred vision so this was discontinued.  He was admitted for further  evaluation and treatment.   On December 26, 2005 his symptoms had improved.  His SGPT was mildly elevated at  52.  Other CMET values were within normal limits, and a hemoglobin A1c was  borderline normal at 6.1.  The CK were slightly elevated at 244 and 284 but  MBs and troponins were all normal.   Because his stress test about a month ago was without ischemia and his EF  was normal, Dr. Jens Som felt that Mr. Miceli could be discharged home  with the addition of a proton pump inhibitor to his medication regimen and  an outpatient office visit arranged in follow up.   DISCHARGE INSTRUCTIONS:  1.  His activity level is to be increased gradually.  2.  He is to follow up with Dr. Jens Som on January 28, 2006 at 9:45.  3.  He is to call Dr. Alwyn Ren for an appointment as well.  He is encouraged      to continue the healthy lifestyle changes he has made  including stopping      alcohol and the fact that he does not drink.  Compliance with his      medications is encouraged.   DISCHARGE MEDICATIONS:  1.  Verapamil 180 mg b.i.d.  2.  Coated aspirin 81 mg daily.  3.  Protonix 40 mg daily.  4.  Hydrocodone prior to admission.      Theodore Demark, P.A. [facility]    ______________________________  Olga Millers, M.D. [facility]    RB/MEDQ  D:  12/26/2005  T:  12/26/2005  Job:  [id number]   cc:   Titus Dubin. Alwyn Ren, M.D. [facility]  [phone] [place]  [place] [address]

## 2011-02-10 ENCOUNTER — Other Ambulatory Visit: Payer: Self-pay | Admitting: Internal Medicine

## 2011-02-10 NOTE — Telephone Encounter (Signed)
Patient needs to schedule a CPX  

## 2011-03-07 ENCOUNTER — Other Ambulatory Visit: Payer: Self-pay | Admitting: Internal Medicine

## 2011-03-07 NOTE — Telephone Encounter (Signed)
Patient due for annual exam.

## 2011-03-10 ENCOUNTER — Telehealth: Payer: Self-pay

## 2011-03-10 NOTE — Telephone Encounter (Signed)
1.) Lisinopril makes patient feel drowsy, patient only taking once daily x 1 week. B/P running high 180/97, 175/80  2.) Patient taking ibuprofen 800 mg (RX'ed by Dr.[name]'s), patient states since starting this med patient with arm pain (alternating from left to right) , tingling pain.   3.) Patient also mentioned he is having Chest Pain   With all symptoms given I recommended ER or Urgent Care right away, patient mentioned he has to go to his daughters honor society program at 6. I advise patient that would be against my advice. Patient stated he will go as soon as he leaves her program .

## 2011-04-28 ENCOUNTER — Telehealth: Payer: Self-pay | Admitting: Internal Medicine

## 2011-04-28 NOTE — Telephone Encounter (Signed)
Left message to call office

## 2011-04-28 NOTE — Telephone Encounter (Signed)
Has a problem with med - didn't give any more info

## 2011-05-05 NOTE — Telephone Encounter (Signed)
Left message to call office

## 2011-05-07 NOTE — Telephone Encounter (Signed)
Left message to call office

## 2011-05-13 ENCOUNTER — Ambulatory Visit (INDEPENDENT_AMBULATORY_CARE_PROVIDER_SITE_OTHER): Payer: BC Managed Care – PPO | Admitting: Internal Medicine

## 2011-05-13 ENCOUNTER — Encounter: Payer: Self-pay | Admitting: Internal Medicine

## 2011-05-13 DIAGNOSIS — E785 Hyperlipidemia, unspecified: Secondary | ICD-10-CM

## 2011-05-13 DIAGNOSIS — I10 Essential (primary) hypertension: Secondary | ICD-10-CM

## 2011-05-13 DIAGNOSIS — R7309 Other abnormal glucose: Secondary | ICD-10-CM

## 2011-05-13 DIAGNOSIS — R6882 Decreased libido: Secondary | ICD-10-CM

## 2011-05-13 MED ORDER — VERAPAMIL HCL 360 MG PO CP24: 360.0000 mg | ORAL_CAPSULE | Freq: Every day | ORAL | Status: DC

## 2011-05-13 MED ORDER — SILDENAFIL CITRATE 100 MG PO TABS: 100.0000 mg | ORAL_TABLET | ORAL | Status: DC | PRN

## 2011-05-13 NOTE — Progress Notes (Signed)
Subjective:    Patient ID: [name], male    DOB: [birth year], 45 y.o.   MRN: [id number]  HPI HYPERTENSION; Disease Monitoring: Blood pressure range-180/92 @ [employer] 1.5 weeks ago  Chest pain, palpitations- ongoing chest pain; S/P extensive cardiology evaluation including cath       Dyspnea- intermittently even @ rest Medications: Compliance- ACE-I decreased to 1 daily due to drowsiness Lightheadedness,Syncope- rarely ; he was with elevated BP    Edema- no  FASTING HYPERGLYCEMIA, PMH of : Disease Monitoring: Blood Sugar ranges-no monitor  Polyuria/phagia/dipsia- polyuria       Visual problems- some blurring  HYPERLIPIDEMIA: Disease Monitoring:Lipids9/27/12 (work) : LDL 111, HDL 47,TG 88. See symptoms for Hypertension Medications: Compliance- not on statin currently FH : brother, 68 ,  has CAD & has had CVAs            Review of Systems Abd pain, bowel changes- no;Muscle aches- no but LBP & knee pain; some ED with decreased libido . Some muscle weakness.Testosterone 249.79 in 01/11.       Objective:   Physical Exam Gen.: Healthy and well-nourished in appearance. Alert, appropriate and cooperative throughout exam. Head: Normocephalic without obvious abnormalities;  Head shaven; beard & moustache Eyes: No corneal or conjunctival inflammation noted.  Mouth: Oral mucosa and oropharynx reveal no lesions or exudates. Teeth in good repair. Neck: No deformities, masses, or tenderness noted. Range of motion &. Thyroid normal. Lungs: Normal respiratory effort; chest expands symmetrically. Lungs are clear to auscultation without rales, wheezes, or increased work of breathing. Heart: Normal rate and rhythm. Normal S1 and S2. No gallop, click, or rub. No murmur. Abdomen: Bowel sounds normal; abdomen soft and nontender. No masses, organomegaly or hernias noted.                                                                              Musculoskeletal/extremities:  No  clubbing, cyanosis, edema, or deformity noted. Tone & strength  normal.Joints normal. Nail health  good. Vascular: Carotid, radial artery, dorsalis pedis and  posterior tibial pulses are full and equal. No bruits present. Neurologic: Alert and oriented x3. Deep tendon reflexes symmetrical and normal.          Skin: Intact without suspicious lesions or rashes. Lymph: No cervical, axillary lymphadenopathy present. Psych: Mood and affect are normal. Normally interactive                                                                                         Assessment & Plan:  #1 dyslipidemia; his LDL is at goal of less than 120. Ideally be less than 95.  #2 hypertension, controlled despite decrease in the ace inhibitor to once daily.  #3 past medical history hyperglycemia with A1c as high as 6.1%. Update indicated  #4 decreased libido and erectile dysfunction. Past history of low screening Testosterone  level. Trial of Viagra 100 mg 1/2-1 daily as needed. Fasting free testosterone to assess a low libido.

## 2011-05-13 NOTE — Patient Instructions (Addendum)
Please  schedule fasting Labs : BUN,creat,K+,FREE Testosterone,TSH,A1c. PLEASE BRING THESE INSTRUCTIONS TO FOLLOW UP  LAB APPOINTMENT.This will guarantee correct labs are drawn, eliminating need for repeat blood sampling ( needle sticks ! ). Diagnoses /Codes: 401.9, 272.4,790.29,799.81. Blood Pressure Goal  Ideally is an AVERAGE < 135/85. This AVERAGE should be calculated from @ least 5-7 BP readings taken @ different times of day on different days of week. You should not respond to isolated BP readings , but rather the AVERAGE for that week

## 2011-05-14 ENCOUNTER — Other Ambulatory Visit (INDEPENDENT_AMBULATORY_CARE_PROVIDER_SITE_OTHER): Payer: BC Managed Care – PPO

## 2011-05-14 DIAGNOSIS — R7309 Other abnormal glucose: Secondary | ICD-10-CM

## 2011-05-14 DIAGNOSIS — E785 Hyperlipidemia, unspecified: Secondary | ICD-10-CM

## 2011-05-14 DIAGNOSIS — I10 Essential (primary) hypertension: Secondary | ICD-10-CM

## 2011-05-14 DIAGNOSIS — R6882 Decreased libido: Secondary | ICD-10-CM

## 2011-05-14 LAB — TSH: TSH: 0.96 u[IU]/mL (ref 0.35–5.50)

## 2011-05-14 LAB — BUN: BUN: 16 mg/dL (ref 6–23)

## 2011-05-14 LAB — CREATININE, SERUM: Creatinine, Ser: 1 mg/dL (ref 0.4–1.5)

## 2011-06-18 ENCOUNTER — Encounter: Payer: Self-pay | Admitting: Internal Medicine

## 2011-06-18 ENCOUNTER — Ambulatory Visit (INDEPENDENT_AMBULATORY_CARE_PROVIDER_SITE_OTHER): Payer: BC Managed Care – PPO | Admitting: Internal Medicine

## 2011-06-18 VITALS — BP 126/82 | HR 81 | Temp 97.9°F | Resp 12 | Ht 67.75 in | Wt 249.2 lb

## 2011-06-18 DIAGNOSIS — N529 Male erectile dysfunction, unspecified: Secondary | ICD-10-CM

## 2011-06-18 DIAGNOSIS — Z23 Encounter for immunization: Secondary | ICD-10-CM

## 2011-06-18 DIAGNOSIS — Z0000 Encounter for general adult medical examination without abnormal findings: Secondary | ICD-10-CM

## 2011-06-18 DIAGNOSIS — M25569 Pain in unspecified knee: Secondary | ICD-10-CM

## 2011-06-18 DIAGNOSIS — I4510 Unspecified right bundle-branch block: Secondary | ICD-10-CM | POA: Insufficient documentation

## 2011-06-18 DIAGNOSIS — R9431 Abnormal electrocardiogram [ECG] [EKG]: Secondary | ICD-10-CM

## 2011-06-18 DIAGNOSIS — M25561 Pain in right knee: Secondary | ICD-10-CM

## 2011-06-18 MED ORDER — TRAMADOL HCL 50 MG PO TABS: 50.0000 mg | ORAL_TABLET | Freq: Four times a day (QID) | ORAL | Status: AC | PRN

## 2011-06-18 MED ORDER — TADALAFIL 20 MG PO TABS: ORAL_TABLET | ORAL | Status: DC

## 2011-06-18 NOTE — Assessment & Plan Note (Signed)
06/18/2011 EKG shows borderline incomplete right bundle branch block. The previously noted diffuse nonspecific ST-T changes have resolved. He has been evaluated on multiple occasions for chest pain. Catheterization 2007 revealed left ventricular dysfunction but normal coronary arteries. The dramatic improvement EKG raises the question of possible coronary artery spasm as etiology of the previously noted EKG changes & symptoms. Cardiology reassessment would be appropriate.

## 2011-06-18 NOTE — Progress Notes (Signed)
Subjective:    Patient ID: [name], male    DOB: Jun 04, 1965, 46 y.o.   MRN: [id number]  HPI [name] is here for a physical;acute issues include knee pain.      Review of Systems Extremity pain Location:both Onset:8 years but progressive Trigger/injury:no Pain quality:variable Pain severity:up to > 10 Duration:weeks  Radiation:no Exacerbating factors:no factors Treatment/response:NSAIDS w/o benefit Review of systems: Constitutional: no fever, chills, sweats, change in weight  Musculoskeletal:no  muscle cramps or pain; occasional  joint stiffness &  swelling Skin:occasional red color change of knees Neuro: occasional leg weakness ;no  incontinence (stool/urine);no   numbness and tingling Heme:no lymphadenopathy; abnormal bruising or bleeding   He describes erectile dysfunction with decreased libido. Viagra has been of beneficial. He denies generalized weakness. Testosterone level was normal at 394.23 on 05/28/11. Free testosterone level was also normal. TSH and A1c were also normal       Objective:   Physical Exam Gen.: Healthy and well-nourished in appearance. Alert, appropriate and cooperative throughout exam. Head: Normocephalic without obvious abnormalities; moustache & beard; head shaven  Eyes: No corneal or conjunctival inflammation noted. Pupils equal round reactive to light and accommodation. Fundal exam is benign without hemorrhages, exudate, papilledema. Extraocular motion intact. Vision grossly normal. Ears: External  ear exam reveals no significant lesions or deformities. Canals clear .TMs normal. Hearing is grossly normal bilaterally. Nose: External nasal exam reveals no deformity or inflammation. Nasal mucosa are pink and moist. No lesions or exudates noted.   Mouth: Oral mucosa and oropharynx reveal no lesions or exudates. Teeth in good repair. Neck: No deformities, masses, or tenderness noted. Range of motion & Thyroid normal. Lungs: Normal  respiratory effort; chest expands symmetrically. Lungs are clear to auscultation without rales, wheezes, or increased work of breathing. Heart: Normal rate and rhythm. Normal S1 and S2. No gallop, click, or rub. S 4 w/o murmur. Abdomen: Bowel sounds normal; abdomen soft and nontender. No masses, organomegaly or hernias noted. Genitalia/DRE: Varices are present on the left. Prostate is normal without asymmetry, enlargement, nodularity, or induration.                                                                                   Musculoskeletal/extremities: No deformity or scoliosis noted of  the thoracic or lumbar spine. No clubbing, cyanosis, edema, or deformity noted. Range of motion  normal; knees reveal crepitus bilaterally. No effusions present .Tone & strength  normal.Joints normal. Nail health  good. Vascular: Carotid, radial artery, dorsalis pedis and  posterior tibial pulses are full and equal. No bruits present. Neurologic: Alert and oriented x3. Deep tendon reflexes symmetrical and normal.          Skin: Intact without suspicious lesions or rashes. Lymph: No cervical, axillary, or inguinal lymphadenopathy present. Psych: Mood and affect are normal. Normally interactive  Assessment & Plan:  #1 comprehensive physical exam; no acute findings  #2 knee pain; degenerative joint disease suggested. Medications will be prescribed; orthopedic consult if no better.  #3 erectile dysfunction and decreased libido.  Cialis was offered as a trial. # 4 see Problem List with Assessments & Recommendations Plan: see Orders

## 2011-06-18 NOTE — Patient Instructions (Signed)
Preventive Health Care: Exercise at least 30-45 minutes a day,  3-4 days a week.  Eat a low-fat diet with lots of fruits and vegetables, up to 7-9 servings per day. Avoid obesity; your goal is waist measurement < 40 inches.Consume less than 40 grams of sugar per day from foods & drinks with High Fructose Corn Sugar as # 1,2,3 or # 4 on label. Use an anti-inflammatory cream such as Aspercreme or Zostrix cream twice a day to knees as needed. In lieu of this warm moist compresses or  hot water bottle can be used. Do not apply ice to the knees.

## 2011-06-20 ENCOUNTER — Other Ambulatory Visit: Payer: Self-pay

## 2011-06-20 DIAGNOSIS — I10 Essential (primary) hypertension: Secondary | ICD-10-CM

## 2011-06-20 MED ORDER — VERAPAMIL HCL 360 MG PO CP24: 360.0000 mg | ORAL_CAPSULE | Freq: Every day | ORAL | Status: DC

## 2011-06-20 NOTE — Telephone Encounter (Signed)
RX sent

## 2011-07-07 ENCOUNTER — Ambulatory Visit (INDEPENDENT_AMBULATORY_CARE_PROVIDER_SITE_OTHER): Payer: BC Managed Care – PPO | Admitting: Cardiovascular Disease

## 2011-07-07 ENCOUNTER — Encounter: Payer: Self-pay | Admitting: Cardiovascular Disease

## 2011-07-07 DIAGNOSIS — I4510 Unspecified right bundle-branch block: Secondary | ICD-10-CM

## 2011-07-07 DIAGNOSIS — R079 Chest pain, unspecified: Secondary | ICD-10-CM

## 2011-07-07 NOTE — Patient Instructions (Signed)
Your physician wants you to follow-up in: 1 year or sooner if need, You will receive a reminder letter in the mail two months in advance. If you don't receive a letter, please call our office to schedule the follow-up appointment.

## 2011-07-07 NOTE — Progress Notes (Signed)
[name] Date of Birth  [birth year] [facility]     [place] [facility]  [address]    [address]   [address] [place], [place]  [address]    [place], [place]  [address] [phone]  Fax  [phone]  [phone]  Fax [phone]  Problems: 1. Chest pain 2. Incomplete right bundle branch block 3. Hypertension .   History of Present Illness:  [name] is a 46 yo with a long hx of chest pain.  This pain occurs sporadically. It is not associated with eating, drinking, change of position, or exercise. The pains last for anywhere from 30 minutes to one hour.  He describes it as a tightness, pressure type sensation. It is it is occasionally sharp.  It is not worsened by exertion.  He's had a stress test in the past which was normal.  He had a heart catheterization approximately 5 or 6 years ago which was unremarkable.  He has chronic knee pain which limits his exercise. He's been trying to exercise and work on a good diet over the past month or so.   Current Outpatient Prescriptions on File Prior to Visit  Medication Sig Dispense Refill  . lisinopril (PRINIVIL,ZESTRIL) 20 MG tablet        . verapamil (VERELAN PM) 360 MG 24 hr capsule Take 1 capsule (360 mg total) by mouth at bedtime.  90 capsule  3    No Known Allergies  Past Medical History  Diagnosis Date  . Cluster headache   . HTN (hypertension)     Past Surgical History  Procedure Date  . Wisdom tooth extraction   . Cardiac catheterization 2007    Left ventricular dysfunction; normal coronaries  . Upper gastrointestinal endoscopy 2007    History  Smoking status  . Never Smoker   Smokeless tobacco  . Not on file    History  Alcohol Use No    Family History  Problem Relation Age of Onset  . Cancer Mother     Mesothelioma   . Diabetes Brother   . Hypertension Brother   . Stroke Brother   . Diabetes Maternal Aunt   . Diabetes Maternal Grandmother   . Heart disease Maternal  Grandmother   . Hypertension Maternal Grandmother   . Stroke Maternal Grandmother   . Heart disease Maternal Grandfather   . Migraines Brother     Reviw of Systems:  Reviewed in the HPI.  All other systems are negative.  Physical Exam: Blood pressure 147/95, pulse 73, height 5\' 7"  (1.702 m), weight 245 lb (111.131 kg). General: Well developed, well nourished, in no acute distress.  Head: Normocephalic, atraumatic, sclera non-icteric, mucus membranes are moist,   Neck: Supple. Negative for carotid bruits. JVD not elevated.  Lungs: Clear bilaterally to auscultation without wheezes, rales, or rhonchi. Breathing is unlabored.  Heart: RRR with S1 S2. No murmurs, rubs, or gallops appreciated.  Abdomen: Soft, non-tender, non-distended with normoactive bowel sounds. No hepatomegaly. No rebound/guarding. No obvious abdominal masses.  Msk:  Strength and tone appear normal for age.  Extremities: No clubbing or cyanosis. No edema.  Distal pedal pulses are 2+ and equal bilaterally.  Neuro: Alert and oriented X 3. Moves all extremities spontaneously.  Psych:  Responds to questions appropriately with a normal affect.  ECG: Normal sinus rhythm. He has an incomplete right bundle branch block. EKG is not significantly changed from previous EKG that he brought from 12/12/2005.  Assessment / Plan:

## 2011-07-07 NOTE — Assessment & Plan Note (Signed)
This is benign and does not  need any specific treatment. We'll continue to get EKGs on a yearly basis.

## 2011-07-07 NOTE — Assessment & Plan Note (Signed)
[name] has a history of chest pain in the past. He had a cardiac catheterization in 2007 which was normal. He did have mildly depressed left ventricular systolic function which was thought to be due to alcoholism.  At this point [name] is doing fairly well and does not need a stress test. His symptoms do not sound anginal in quality. I've asked him to call if he has any recurrent episodes of chest pain. I'll see him again in one year for office visit an EKG.

## 2011-07-18 ENCOUNTER — Other Ambulatory Visit: Payer: Self-pay | Admitting: Internal Medicine

## 2011-07-21 NOTE — Telephone Encounter (Signed)
?   If ok to fill x 1 year, last OV 06/18/2011

## 2011-07-21 NOTE — Telephone Encounter (Signed)
OK # 6

## 2011-07-21 NOTE — Telephone Encounter (Signed)
#  6 R X5.CAUTION: Viagra cannot be taken with nitroglycerin as there could be severe cardiovascular adverse side effects.

## 2011-07-21 NOTE — Telephone Encounter (Signed)
Please advise if ok to fill x 1 year

## 2011-08-27 ENCOUNTER — Other Ambulatory Visit: Payer: Self-pay | Admitting: Internal Medicine

## 2011-09-05 ENCOUNTER — Emergency Department (HOSPITAL_COMMUNITY): Payer: BC Managed Care – PPO

## 2011-09-05 ENCOUNTER — Emergency Department (HOSPITAL_COMMUNITY)
Admission: EM | Admit: 2011-09-05 | Discharge: 2011-09-05 | Disposition: A | Discharge status: Home / Self Care | Payer: BC Managed Care – PPO | Attending: Emergency Medicine | Admitting: Emergency Medicine

## 2011-09-05 DIAGNOSIS — Z79899 Other long term (current) drug therapy: Secondary | ICD-10-CM | POA: Insufficient documentation

## 2011-09-05 DIAGNOSIS — M25569 Pain in unspecified knee: Secondary | ICD-10-CM | POA: Insufficient documentation

## 2011-09-05 DIAGNOSIS — I10 Essential (primary) hypertension: Secondary | ICD-10-CM | POA: Insufficient documentation

## 2011-09-05 DIAGNOSIS — M25562 Pain in left knee: Secondary | ICD-10-CM

## 2011-09-05 DIAGNOSIS — L539 Erythematous condition, unspecified: Secondary | ICD-10-CM | POA: Insufficient documentation

## 2011-09-05 LAB — CBC
HCT: 43.1 % (ref 39.0–52.0)
Hemoglobin: 15 g/dL (ref 13.0–17.0)
MCHC: 34.8 g/dL (ref 30.0–36.0)
RBC: 5.03 MIL/uL (ref 4.22–5.81)

## 2011-09-05 LAB — DIFFERENTIAL
Basophils Absolute: 0 10*3/uL (ref 0.0–0.1)
Basophils Relative: 0 % (ref 0–1)
Eosinophils Absolute: 0.4 10*3/uL (ref 0.0–0.7)
Monocytes Absolute: 0.9 10*3/uL (ref 0.1–1.0)
Neutro Abs: 5.9 10*3/uL (ref 1.7–7.7)
Neutrophils Relative %: 52 % (ref 43–77)

## 2011-09-05 MED ORDER — HYDROCODONE-ACETAMINOPHEN 5-325 MG PO TABS
2.0000 | ORAL_TABLET | Freq: Once | ORAL | Status: AC
  Administered 2011-09-05: 2 via ORAL
  Filled 2011-09-05: qty 2

## 2011-09-05 MED ORDER — IBUPROFEN 600 MG PO TABS: 600.0000 mg | ORAL_TABLET | Freq: Four times a day (QID) | ORAL | Status: AC | PRN

## 2011-09-05 MED ORDER — OXYCODONE-ACETAMINOPHEN 5-325 MG PO TABS: 1.0000 | ORAL_TABLET | Freq: Four times a day (QID) | ORAL | Status: AC | PRN

## 2011-09-05 MED ORDER — IBUPROFEN 200 MG PO TABS
600.0000 mg | ORAL_TABLET | Freq: Once | ORAL | Status: AC
  Administered 2011-09-05: 600 mg via ORAL
  Filled 2011-09-05: qty 3

## 2011-09-05 NOTE — ED Notes (Signed)
Bilateral knee pain hx of arthritis, pt. Reports in the last 3 days pain has become worse.  Rt. Greater than lt.  No swelling or deformity noted.  Denies any injuries

## 2011-09-05 NOTE — ED Provider Notes (Signed)
History     CSN: [id number]  Arrival date & time 09/05/11  1746   First MD Initiated Contact with Patient 09/05/11 2049      Chief Complaint  Patient presents with  . Knee Pain    (Consider location/radiation/quality/duration/timing/severity/associated sxs/prior treatment) HPI Comments: Patient with history of bilateral arthritis in both knees comes in today with increasing pain, erythema, and warmth to the touch of his left knee. He reports that he has put icy hot on the knee, but it did not help.  He denies any fever/chills.  He reports that he is able to move the knee and is able to ambulate, but has increased pain when doing so.  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  He reports that he has had increased pain similar to this in the past.   The history is provided by the patient.    Past Medical History  Diagnosis Date  . Cluster headache   . HTN (hypertension)     Past Surgical History  Procedure Date  . Wisdom tooth extraction   . Cardiac catheterization 2007    Left ventricular dysfunction; normal coronaries  . Upper gastrointestinal endoscopy 2007    Family History  Problem Relation Age of Onset  . Cancer Mother     Mesothelioma   . Diabetes Brother   . Hypertension Brother   . Stroke Brother   . Diabetes Maternal Aunt   . Diabetes Maternal Grandmother   . Heart disease Maternal Grandmother   . Hypertension Maternal Grandmother   . Stroke Maternal Grandmother   . Heart disease Maternal Grandfather   . Migraines Brother     History  Substance Use Topics  . Smoking status: Never Smoker   . Smokeless tobacco: Not on file  . Alcohol Use: No      Review of Systems  Constitutional: Negative for fever and chills.  Musculoskeletal: Negative for joint swelling and gait problem.  Skin: Positive for color change.  Neurological: Negative for numbness.    Allergies  Review of patient's  allergies indicates no known allergies.  Home Medications   Current Outpatient Rx  Name Route Sig Dispense Refill  . LISINOPRIL 20 MG PO TABS Oral Take 20 mg by mouth at bedtime.     [name] SILDENAFIL CITRATE 100 MG PO TABS Oral Take 100 mg by mouth as needed. 1 by mouth as needed for ED, Viagra cannot be taken with nitroglycerin as there could be severe cardiovascular adverse side effects.    [name] VERAPAMIL HCL 360 MG PO CP24 Oral Take 360 mg by mouth at bedtime.      BP 144/91  Pulse 78  Temp(Src) 98 F (36.7 C) (Oral)  Resp 18  Ht 5\' 6"  (1.676 m)  Wt 249 lb (112.946 kg)  BMI 40.19 kg/m2  SpO2 100%  Physical Exam  Nursing note and vitals reviewed. Constitutional: He appears well-developed and well-nourished.  HENT:  Head: Normocephalic and atraumatic.  Cardiovascular: Normal rate, regular rhythm and normal heart sounds.   Pulses:      Dorsalis pedis pulses are 2+ on the left side.  Pulmonary/Chest: Effort normal and breath sounds normal. No respiratory distress.  Musculoskeletal:       Left knee: He exhibits erythema. He exhibits normal range of motion, no swelling, no effusion and no deformity.       Pain with ROM of left knee  Neurological: He is alert. No sensory deficit.  Skin: Skin is warm and dry. There is erythema.       Erythema of the skin overlying left patella.  No warmth to the touch.    ED Course  Procedures (including critical care time)  Labs Reviewed - No data to display No results found.   1. Knee pain, left      MDM  Patient comes in today with increased pain and redness of left knee. Pain with ROM.  No acute injury or trauma.  Negative xray.   Patient afebrile.  WBC only mildly elevated.  No effusion present.  Discussed with Dr. Radford Pax who also evaluated patient.  Feel that patient most likely has Bursitis.  Patient given Rx for ibuprofen 600mg  qid and instructed to rest his knee.  Per Dr. Corky Sing recommendation,  Patient instructed to follow up in one  day to have knee reevaluated to determine if it has worsened.        Pascal Lux [place], PA-C 09/06/11 [phone]

## 2011-09-05 NOTE — Discharge Instructions (Signed)
Take 600mg  ibuprofen four times a day.  Take percocet as needed for pain.  This medication contains tylenol.  This medication can also make you tired.  Do not drive or operate heavy machinery while on this medication.  Return to the Emergency Department tomorrow to have the knee rechecked.

## 2011-09-05 NOTE — ED Notes (Signed)
Patient given graham crackers & ginger-ale.

## 2011-09-05 NOTE — ED Notes (Signed)
PT back from X-ray  

## 2011-09-05 NOTE — ED Notes (Signed)
Pt with hx of chronic knee pain.  Last had cortisone tx to L knee 2 years prior.  Presently, both knees hurt, though L knee is excruciating.  L knee red and edematous and painful, even to touch.

## 2011-09-07 ENCOUNTER — Encounter (HOSPITAL_COMMUNITY): Payer: Self-pay | Admitting: Emergency Medicine

## 2011-09-07 ENCOUNTER — Emergency Department (HOSPITAL_COMMUNITY)
Admission: EM | Admit: 2011-09-07 | Discharge: 2011-09-07 | Disposition: A | Discharge status: Home / Self Care | Payer: BC Managed Care – PPO | Attending: Emergency Medicine | Admitting: Emergency Medicine

## 2011-09-07 DIAGNOSIS — I10 Essential (primary) hypertension: Secondary | ICD-10-CM | POA: Insufficient documentation

## 2011-09-07 DIAGNOSIS — M25569 Pain in unspecified knee: Secondary | ICD-10-CM | POA: Insufficient documentation

## 2011-09-07 DIAGNOSIS — M25469 Effusion, unspecified knee: Secondary | ICD-10-CM | POA: Insufficient documentation

## 2011-09-07 DIAGNOSIS — M25562 Pain in left knee: Secondary | ICD-10-CM

## 2011-09-07 NOTE — Progress Notes (Signed)
Orthopedic Tech Progress Note Patient Details:  [name] [birth year] [id number]  Other Ortho Devices Type of Ortho Device: Crutches Ortho Device Location: (L) knee Ortho Device Interventions: [name] 09/07/2011, 5:57 PM

## 2011-09-07 NOTE — Discharge Instructions (Signed)
Knee Pain The knee is the complex joint between your thigh and your lower leg. It is made up of bones, tendons, ligaments, and cartilage. The bones that make up the knee are:  The femur in the thigh.   The tibia and fibula in the lower leg.   The patella or kneecap riding in the groove on the lower femur.  CAUSES  Knee pain is a common complaint with many causes. A few of these causes are:  Injury, such as:   A ruptured ligament or tendon injury.   Torn cartilage.   Medical conditions, such as:   Gout   Arthritis   Infections   Overuse, over training or overdoing a physical activity.  Knee pain can be minor or severe. Knee pain can accompany debilitating injury. Minor knee problems often respond well to self-care measures or get well on their own. More serious injuries may need medical intervention or even surgery. SYMPTOMS The knee is complex. Symptoms of knee problems can vary widely. Some of the problems are:  Pain with movement and weight bearing.   Swelling and tenderness.   Buckling of the knee.   Inability to straighten or extend your knee.   Your knee locks and you cannot straighten it.   Warmth and redness with pain and fever.   Deformity or dislocation of the kneecap.  DIAGNOSIS  Determining what is wrong may be very straight forward such as when there is an injury. It can also be challenging because of the complexity of the knee. Tests to make a diagnosis may include:  Your caregiver taking a history and doing a physical exam.   Routine X-rays can be used to rule out other problems. X-rays will not reveal a cartilage tear. Some injuries of the knee can be diagnosed by:   Arthroscopy a surgical technique by which a small video camera is inserted through tiny incisions on the sides of the knee. This procedure is used to examine and repair internal knee joint problems. Tiny instruments can be used during arthroscopy to repair the torn knee cartilage  (meniscus).   Arthrography is a radiology technique. A contrast liquid is directly injected into the knee joint. Internal structures of the knee joint then become visible on X-ray film.   An MRI scan is a non x-ray radiology procedure in which magnetic fields and a computer produce two- or three-dimensional images of the inside of the knee. Cartilage tears are often visible using an MRI scanner. MRI scans have largely replaced arthrography in diagnosing cartilage tears of the knee.   Blood work.   Examination of the fluid that helps to lubricate the knee joint (synovial fluid). This is done by taking a sample out using a needle and a syringe.  TREATMENT The treatment of knee problems depends on the cause. Some of these treatments are:  Depending on the injury, proper casting, splinting, surgery or physical therapy care will be needed.   Give yourself adequate recovery time. Do not overuse your joints. If you begin to get sore during workout routines, back off. Slow down or do fewer repetitions.   For repetitive activities such as cycling or running, maintain your strength and nutrition.   Alternate muscle groups. For example if you are a weight lifter, work the upper body on one day and the lower body the next.   Either tight or weak muscles do not give the proper support for your knee. Tight or weak muscles do not absorb the stress placed   on the knee joint. Keep the muscles surrounding the knee strong.   Take care of mechanical problems.   If you have flat feet, orthotics or special shoes may help. See your caregiver if you need help.   Arch supports, sometimes with wedges on the inner or outer aspect of the heel, can help. These can shift pressure away from the side of the knee most bothered by osteoarthritis.   A brace called an "unloader" brace also may be used to help ease the pressure on the most arthritic side of the knee.   If your caregiver has prescribed crutches, braces,  wraps or ice, use as directed. The acronym for this is PRICE. This means protection, rest, ice, compression and elevation.   Nonsteroidal anti-inflammatory drugs (NSAID's), can help relieve pain. But if taken immediately after an injury, they may actually increase swelling. Take NSAID's with food in your stomach. Stop them if you develop stomach problems. Do not take these if you have a history of ulcers, stomach pain or bleeding from the bowel. Do not take without your caregiver's approval if you have problems with fluid retention, heart failure, or kidney problems.   For ongoing knee problems, physical therapy may be helpful.   Glucosamine and chondroitin are over-the-counter dietary supplements. Both may help relieve the pain of osteoarthritis in the knee. These medicines are different from the usual anti-inflammatory drugs. Glucosamine may decrease the rate of cartilage destruction.   Injections of a corticosteroid drug into your knee joint may help reduce the symptoms of an arthritis flare-up. They may provide pain relief that lasts a few months. You may have to wait a few months between injections. The injections do have a small increased risk of infection, water retention and elevated blood sugar levels.   Hyaluronic acid injected into damaged joints may ease pain and provide lubrication. These injections may work by reducing inflammation. A series of shots may give relief for as long as 6 months.   Topical painkillers. Applying certain ointments to your skin may help relieve the pain and stiffness of osteoarthritis. Ask your pharmacist for suggestions. Many over the-counter products are approved for temporary relief of arthritis pain.   In some countries, doctors often prescribe topical NSAID's for relief of chronic conditions such as arthritis and tendinitis. A review of treatment with NSAID creams found that they worked as well as oral medications but without the serious side effects.    PREVENTION  Maintain a healthy weight. Extra pounds put more strain on your joints.   Get strong, stay limber. Weak muscles are a common cause of knee injuries. Stretching is important. Include flexibility exercises in your workouts.   Be smart about exercise. If you have osteoarthritis, chronic knee pain or recurring injuries, you may need to change the way you exercise. This does not mean you have to stop being active. If your knees ache after jogging or playing basketball, consider switching to swimming, water aerobics or other low-impact activities, at least for a few days a week. Sometimes limiting high-impact activities will provide relief.   Make sure your shoes fit well. Choose footwear that is right for your sport.   Protect your knees. Use the proper gear for knee-sensitive activities. Use kneepads when playing volleyball or laying carpet. Buckle your seat belt every time you drive. Most shattered kneecaps occur in car accidents.   Rest when you are tired.  SEEK MEDICAL CARE IF:  You have knee pain that is continual and does not   seem to be getting better.  SEEK IMMEDIATE MEDICAL CARE IF:  Your knee joint feels hot to the touch and you have a high fever. MAKE SURE YOU:   Understand these instructions.   Will watch your condition.   Will get help right away if you are not doing well or get worse.  Document Released: 03/16/2007 Document Revised: 05/08/2011 Document Reviewed: 03/16/2007 [employer] Patient Information 2012 [employer]. 

## 2011-09-07 NOTE — ED Notes (Signed)
Minimal swelling noted to left knee; patient was seen on 09-05-11 and given a prescription for percocet and ibuprofen for which patient reports has not relieved the pain. Patient is to follow up with Dr. Quintella Reichert later this week.

## 2011-09-07 NOTE — ED Provider Notes (Signed)
History     CSN: [id number]  Arrival date & time 09/07/11  1530   First MD Initiated Contact with Patient 09/07/11 1724      Chief Complaint  Patient presents with  . Knee Pain    (Consider location/radiation/quality/duration/timing/severity/associated sxs/prior treatment) HPI Comments: Patient presents today per instructions from the last emergency physician 2 days ago for a recheck of his left knee.  Patient was seen here with left knee pain and mild swelling.  Patient was diagnosed with bursitis after having laboratory studies and an x-ray obtained.  He was placed on anti-inflammatories and Percocet for pain.  He was given followup with his primary care physician as well.  Patient returns today he notes that the redness is decreasing although he still has pain and difficulty with ambulation and mild swelling to the knee.  Patient is a 46 y.o. male presenting with knee pain. The history is provided by the patient. No language interpreter was used.  Knee Pain This is a new problem. The current episode started more than 2 days ago. The problem occurs constantly. The problem has been gradually improving. Pertinent negatives include no chest pain, no abdominal pain, no headaches and no shortness of breath. The symptoms are aggravated by walking. The symptoms are relieved by medications. He has tried a cold compress and rest for the symptoms.    Past Medical History  Diagnosis Date  . Cluster headache   . HTN (hypertension)     Past Surgical History  Procedure Date  . Wisdom tooth extraction   . Cardiac catheterization 2007    Left ventricular dysfunction; normal coronaries  . Upper gastrointestinal endoscopy 2007    Family History  Problem Relation Age of Onset  . Cancer Mother     Mesothelioma   . Diabetes Brother   . Hypertension Brother   . Stroke Brother   . Diabetes Maternal Aunt   . Diabetes Maternal Grandmother   . Heart disease Maternal Grandmother   . Hypertension  Maternal Grandmother   . Stroke Maternal Grandmother   . Heart disease Maternal Grandfather   . Migraines Brother     History  Substance Use Topics  . Smoking status: Never Smoker   . Smokeless tobacco: Not on file  . Alcohol Use: No      Review of Systems  Constitutional: Negative.  Negative for fever and chills.  HENT: Negative.   Eyes: Negative.  Negative for discharge and redness.  Respiratory: Negative.  Negative for cough and shortness of breath.   Cardiovascular: Negative.  Negative for chest pain.  Gastrointestinal: Negative.  Negative for nausea, vomiting and abdominal pain.  Genitourinary: Negative.  Negative for hematuria.  Musculoskeletal: Positive for joint swelling and arthralgias. Negative for back pain.  Skin: Negative.  Negative for color change and rash.  Neurological: Negative for syncope and headaches.  Hematological: Negative.  Negative for adenopathy.  Psychiatric/Behavioral: Negative.  Negative for confusion.  All other systems reviewed and are negative.    Allergies  Review of patient's allergies indicates no known allergies.  Home Medications   Current Outpatient Rx  Name Route Sig Dispense Refill  . IBUPROFEN 600 MG PO TABS Oral Take 1 tablet (600 mg total) by mouth every 6 (six) hours as needed for pain. 30 tablet 0  . LISINOPRIL 20 MG PO TABS Oral Take 20 mg by mouth at bedtime.     . OXYCODONE-ACETAMINOPHEN 5-325 MG PO TABS Oral Take 1-2 tablets by mouth every 6 (six) hours  as needed for pain. 20 tablet 0  . VERAPAMIL HCL 360 MG PO CP24 Oral Take 360 mg by mouth at bedtime.      BP 143/76  Pulse 73  Temp(Src) 97.7 F (36.5 C) (Oral)  Resp 17  SpO2 97%  Physical Exam  Nursing note and vitals reviewed. Constitutional: He is oriented to person, place, and time. He appears well-developed and well-nourished.  Non-toxic appearance. He does not have a sickly appearance.  HENT:  Head: Normocephalic and atraumatic.  Eyes: Conjunctivae, EOM  and lids are normal. Pupils are equal, round, and reactive to light.  Neck: Trachea normal, normal range of motion and full passive range of motion without pain. Neck supple.  Cardiovascular: Normal rate.   Pulmonary/Chest: Effort normal.  Abdominal: Normal appearance. There is no CVA tenderness.  Musculoskeletal: He exhibits edema and tenderness.       Patient can flex and extend his left knee but does have some pain upon doing so.  He has only minimal erythema present on his knee now.  There is generalized tenderness to palpation of the knee.  Mild swelling is noted as well.  No distal lower leg swelling is noted.  Neurological: He is alert and oriented to person, place, and time. He has normal strength.  Skin: Skin is warm, dry and intact. No rash noted.  Psychiatric: He has a normal mood and affect. His behavior is normal. Judgment and thought content normal.    ED Course  Procedures (including critical care time)  Labs Reviewed - No data to display Dg Knee Complete 4 Views Left  09/05/2011  *RADIOLOGY REPORT*  Clinical Data: Left knee pain for 2 days, no injury  LEFT KNEE - COMPLETE 4+ VIEW  Comparison: None.  Findings: The left knee joint spaces appear relatively normal.  No fracture is seen.  No effusion is noted.  Alignment is normal.  IMPRESSION: Negative.  Original Report Authenticated By: Juline Patch, M.D.     No diagnosis found.    MDM  Patient presented for a recheck of his left knee which appears to be slowly improving.  He does not appear to have a septic joint at this time.  Patient is having difficulty with ambulation so I will give him crutches today and followup with orthopedics.  Also given a work note for the next 2-3 days as he does a physical job as a Location manager.        Nat Christen, MD 09/07/11 [phone]

## 2011-09-07 NOTE — ED Notes (Signed)
C/o L knee pain x 4-5 days.  States he was seen for same on Friday and diagnosed with bursitis.    States he was suppose to come back yesterday for recheck.

## 2011-09-10 NOTE — ED Provider Notes (Signed)
Medical screening examination/treatment/procedure(s) were performed by non-physician practitioner and as supervising physician I was immediately available for consultation/collaboration.    Nelia Shi, MD 09/10/11 2202

## 2011-09-11 ENCOUNTER — Ambulatory Visit (HOSPITAL_COMMUNITY)
Admission: RE | Admit: 2011-09-11 | Discharge: 2011-09-11 | Disposition: A | Discharge status: Home / Self Care | Payer: BC Managed Care – PPO | Source: Ambulatory Visit | Attending: Orthopaedic Surgery | Admitting: Orthopaedic Surgery

## 2011-09-11 DIAGNOSIS — M79609 Pain in unspecified limb: Secondary | ICD-10-CM

## 2011-09-11 DIAGNOSIS — M7989 Other specified soft tissue disorders: Secondary | ICD-10-CM | POA: Insufficient documentation

## 2011-09-11 DIAGNOSIS — M79605 Pain in left leg: Secondary | ICD-10-CM

## 2011-09-11 NOTE — Progress Notes (Signed)
VASCULAR LAB PRELIMINARY  PRELIMINARY  PRELIMINARY  PRELIMINARY  Left lower extremity venous duplex has been completed.    Preliminary report: Left leg is negative for deep and superficial vein thrombosis.  Negative for Baker's cyst on left.   Cell number [phone] called (Dr. Magnus Ivan), left message that patient was negative and if I did not hear from him shortly I was going to let patient go .  Also left message that I was telling pt to either expect call from MD tonight and if not to call office in am and ask for instructions.  Explained to pt that office may need to check with [place] or in EPIC about results.  Pt verbalized understanding.   Vanna Scotland,  RVT 09/11/2011, 5:47 PM

## 2011-10-09 HISTORY — PX: OTHER SURGICAL HISTORY: SHX169

## 2011-11-25 ENCOUNTER — Ambulatory Visit (INDEPENDENT_AMBULATORY_CARE_PROVIDER_SITE_OTHER): Payer: BC Managed Care – PPO | Admitting: Internal Medicine

## 2011-11-25 ENCOUNTER — Encounter: Payer: Self-pay | Admitting: Internal Medicine

## 2011-11-25 ENCOUNTER — Ambulatory Visit (INDEPENDENT_AMBULATORY_CARE_PROVIDER_SITE_OTHER)
Admission: RE | Admit: 2011-11-25 | Discharge: 2011-11-25 | Disposition: A | Discharge status: Home / Self Care | Payer: BC Managed Care – PPO | Source: Ambulatory Visit | Attending: Internal Medicine | Admitting: Internal Medicine

## 2011-11-25 VITALS — BP 136/90 | HR 68 | Temp 97.9°F | Wt 250.0 lb

## 2011-11-25 DIAGNOSIS — R079 Chest pain, unspecified: Secondary | ICD-10-CM

## 2011-11-25 DIAGNOSIS — R0609 Other forms of dyspnea: Secondary | ICD-10-CM

## 2011-11-25 DIAGNOSIS — R791 Abnormal coagulation profile: Secondary | ICD-10-CM

## 2011-11-25 DIAGNOSIS — R0600 Dyspnea, unspecified: Secondary | ICD-10-CM

## 2011-11-25 DIAGNOSIS — R0989 Other specified symptoms and signs involving the circulatory and respiratory systems: Secondary | ICD-10-CM

## 2011-11-25 DIAGNOSIS — R7989 Other specified abnormal findings of blood chemistry: Secondary | ICD-10-CM

## 2011-11-25 DIAGNOSIS — I10 Essential (primary) hypertension: Secondary | ICD-10-CM

## 2011-11-25 DIAGNOSIS — R609 Edema, unspecified: Secondary | ICD-10-CM

## 2011-11-25 DIAGNOSIS — R6883 Chills (without fever): Secondary | ICD-10-CM

## 2011-11-25 LAB — CBC WITH DIFFERENTIAL/PLATELET
Basophils Relative: 0.5 % (ref 0.0–3.0)
Eosinophils Absolute: 0.3 10*3/uL (ref 0.0–0.7)
Hemoglobin: 14.1 g/dL (ref 13.0–17.0)
MCHC: 32.8 g/dL (ref 30.0–36.0)
MCV: 87.9 fl (ref 78.0–100.0)
Monocytes Absolute: 0.8 10*3/uL (ref 0.1–1.0)
Neutro Abs: 5.5 10*3/uL (ref 1.4–7.7)
RBC: 4.87 Mil/uL (ref 4.22–5.81)

## 2011-11-25 LAB — BASIC METABOLIC PANEL
CO2: 25 mEq/L (ref 19–32)
Creatinine, Ser: 0.8 mg/dL (ref 0.4–1.5)
Glucose, Bld: 98 mg/dL (ref 70–99)
Potassium: 4 mEq/L (ref 3.5–5.1)

## 2011-11-25 MED ORDER — LISINOPRIL 20 MG PO TABS: 20.0000 mg | ORAL_TABLET | Freq: Every day | ORAL | Status: DC

## 2011-11-25 NOTE — Progress Notes (Signed)
  Subjective:    Patient ID: [name], male    DOB: 1966-04-25, 46 y.o.   MRN: [id number]  HPI He had knee surgery by Dr. Magnus Ivan 10/09/11 for meniscal tears and bursitis. Prior to the surgery he began having swelling in the left anterior shin area & in L foot but it has been somewhat worse postop. He is in physical therapy; he's been told the swelling will resolve over time and is related to his knee condition .  He  has exertional dyspnea and exertional chest pain; he states that the chest pain is stable .Dr Elease Hashimoto has evaluated his chest pain; his 07/04/11  office visit was reviewed.He had a negative cardiac catheterization 2007.  On 09/11/11 a venous Doppler was done for similar symptoms; it revealed no deep venous thrombosis.  There is no personal or past medical history of clotting or bleeding disorders.    Review of Systems He notes being hot and chilled intermittently.He has intermittent paroxysmal nocturnal dyspnea by history.  His "inflammation" in his left knee he has not responded to nonsteroidals     Objective:   Physical Exam Gen.: Healthy and well-nourished in appearance. Alert, appropriate and cooperative throughout exam.  Eyes: No corneal or conjunctival inflammation noted. No icterus Neck: no NVD Lungs: Normal respiratory effort; chest expands symmetrically. Lungs are clear to auscultation without rales, wheezes, or increased work of breathing. Heart: Normal rate and rhythm. Accentuated S1;normal S2. No gallop, click, or rub. No murmur. Abdomen: Bowel sounds normal; abdomen soft and nontender. No masses, organomegaly or hernias noted.No HJR                                                                               Musculoskeletal/extremities: No clubbing or cyanosis. 1+ L ankle edema. Pain with range of motion of the left knee. Some patellar effusion present . Homans sign negative bilaterally .Tone & strength  normal. Nail health  good. Vascular: Carotid, radial  artery, dorsalis pedis and  posterior tibial pulses are full and equal. No bruits present. Neurologic: Alert and oriented x3.     Skin: Intact without suspicious lesions or rashes.Surgical wounds left knee healing well without signs of infection Lymph: No cervical, axillary lymphadenopathy present. Psych: Mood and affect are normal. Normally interactive                                                                                         Assessment & Plan:  #1 effusion left lower extremity  #2 history of recurrent chest pain, stable by report  #3 dyspnea on exertion & PND  #4 chills  Plan: CBC and differential, BNP,BMET, and d-dimer will be collected. Chest x-ray ordered. I'll him to discuss wearing compression hose on  the left lower extremity.

## 2011-11-25 NOTE — Patient Instructions (Addendum)
Order for x-rays entered into  the computer; these will be performed at [address] across from [facility]. No appointment is necessary. Please try to go on My Chart within the next 24 hours to allow me to release the results directly to you.  

## 2011-11-25 NOTE — Addendum Note (Signed)
Addended byPecola Lawless on: 11/25/2011 04:34 PM   Modules accepted: Orders

## 2011-11-26 ENCOUNTER — Encounter (HOSPITAL_COMMUNITY): Payer: Self-pay | Admitting: *Deleted

## 2011-11-26 ENCOUNTER — Ambulatory Visit (HOSPITAL_COMMUNITY)
Admission: RE | Admit: 2011-11-26 | Discharge: 2011-11-26 | Disposition: A | Discharge status: Home / Self Care | Payer: BC Managed Care – PPO | Source: Ambulatory Visit | Attending: Internal Medicine | Admitting: Internal Medicine

## 2011-11-26 ENCOUNTER — Other Ambulatory Visit: Payer: Self-pay

## 2011-11-26 ENCOUNTER — Emergency Department (HOSPITAL_COMMUNITY): Payer: BC Managed Care – PPO

## 2011-11-26 ENCOUNTER — Inpatient Hospital Stay (HOSPITAL_COMMUNITY)
Admission: EM | Admit: 2011-11-26 | Discharge: 2011-11-28 | DRG: 543 | Disposition: A | Discharge status: Home / Self Care | Payer: BC Managed Care – PPO | Source: Ambulatory Visit | Attending: Internal Medicine | Admitting: Internal Medicine

## 2011-11-26 DIAGNOSIS — R7989 Other specified abnormal findings of blood chemistry: Secondary | ICD-10-CM

## 2011-11-26 DIAGNOSIS — E782 Mixed hyperlipidemia: Secondary | ICD-10-CM | POA: Diagnosis present

## 2011-11-26 DIAGNOSIS — R791 Abnormal coagulation profile: Secondary | ICD-10-CM | POA: Insufficient documentation

## 2011-11-26 DIAGNOSIS — I824Z9 Acute embolism and thrombosis of unspecified deep veins of unspecified distal lower extremity: Principal | ICD-10-CM | POA: Diagnosis present

## 2011-11-26 DIAGNOSIS — R7309 Other abnormal glucose: Secondary | ICD-10-CM

## 2011-11-26 DIAGNOSIS — Z86711 Personal history of pulmonary embolism: Secondary | ICD-10-CM | POA: Diagnosis present

## 2011-11-26 DIAGNOSIS — I2699 Other pulmonary embolism without acute cor pulmonale: Secondary | ICD-10-CM

## 2011-11-26 DIAGNOSIS — I82409 Acute embolism and thrombosis of unspecified deep veins of unspecified lower extremity: Secondary | ICD-10-CM

## 2011-11-26 DIAGNOSIS — R079 Chest pain, unspecified: Secondary | ICD-10-CM

## 2011-11-26 DIAGNOSIS — I4510 Unspecified right bundle-branch block: Secondary | ICD-10-CM

## 2011-11-26 DIAGNOSIS — R0600 Dyspnea, unspecified: Secondary | ICD-10-CM

## 2011-11-26 DIAGNOSIS — I10 Essential (primary) hypertension: Secondary | ICD-10-CM | POA: Diagnosis present

## 2011-11-26 DIAGNOSIS — F911 Conduct disorder, childhood-onset type: Secondary | ICD-10-CM

## 2011-11-26 DIAGNOSIS — N529 Male erectile dysfunction, unspecified: Secondary | ICD-10-CM

## 2011-11-26 DIAGNOSIS — R609 Edema, unspecified: Secondary | ICD-10-CM

## 2011-11-26 DIAGNOSIS — M7989 Other specified soft tissue disorders: Secondary | ICD-10-CM | POA: Insufficient documentation

## 2011-11-26 DIAGNOSIS — I824Y9 Acute embolism and thrombosis of unspecified deep veins of unspecified proximal lower extremity: Secondary | ICD-10-CM | POA: Diagnosis present

## 2011-11-26 DIAGNOSIS — I2692 Saddle embolus of pulmonary artery without acute cor pulmonale: Secondary | ICD-10-CM

## 2011-11-26 DIAGNOSIS — E785 Hyperlipidemia, unspecified: Secondary | ICD-10-CM

## 2011-11-26 DIAGNOSIS — M545 Low back pain, unspecified: Secondary | ICD-10-CM

## 2011-11-26 HISTORY — DX: Other specific arthropathies, not elsewhere classified, unspecified shoulder: M12.819

## 2011-11-26 LAB — CBC WITH DIFFERENTIAL/PLATELET
Basophils Absolute: 0 10*3/uL (ref 0.0–0.1)
Basophils Relative: 0 % (ref 0–1)
Eosinophils Relative: 5 % (ref 0–5)
HCT: 41.1 % (ref 39.0–52.0)
Lymphocytes Relative: 33 % (ref 12–46)
MCH: 28.6 pg (ref 26.0–34.0)
MCHC: 33.6 g/dL (ref 30.0–36.0)
MCV: 85.1 fL (ref 78.0–100.0)
Monocytes Absolute: 0.8 10*3/uL (ref 0.1–1.0)
RDW: 13.1 % (ref 11.5–15.5)

## 2011-11-26 LAB — BASIC METABOLIC PANEL
CO2: 27 mEq/L (ref 19–32)
Calcium: 9.4 mg/dL (ref 8.4–10.5)
Creatinine, Ser: 0.91 mg/dL (ref 0.50–1.35)

## 2011-11-26 MED ORDER — ENOXAPARIN SODIUM 150 MG/ML ~~LOC~~ SOLN: 1.0000 mg/kg | Freq: Once | SUBCUTANEOUS | Status: DC

## 2011-11-26 MED ORDER — MORPHINE SULFATE 4 MG/ML IJ SOLN
4.0000 mg | Freq: Once | INTRAMUSCULAR | Status: AC
  Administered 2011-11-26: 4 mg via INTRAVENOUS
  Filled 2011-11-26: qty 1

## 2011-11-26 MED ORDER — ENOXAPARIN SODIUM 120 MG/0.8ML ~~LOC~~ SOLN
120.0000 mg | Freq: Once | SUBCUTANEOUS | Status: AC
  Administered 2011-11-26: 120 mg via SUBCUTANEOUS
  Filled 2011-11-26: qty 0.8

## 2011-11-26 MED ORDER — ONDANSETRON HCL 4 MG/2ML IJ SOLN
4.0000 mg | Freq: Once | INTRAMUSCULAR | Status: AC
  Administered 2011-11-26: 4 mg via INTRAVENOUS
  Filled 2011-11-26: qty 2

## 2011-11-26 MED ORDER — IOHEXOL 350 MG/ML SOLN
100.0000 mL | Freq: Once | INTRAVENOUS | Status: AC | PRN
  Administered 2011-11-26: 100 mL via INTRAVENOUS

## 2011-11-26 NOTE — ED Notes (Signed)
Pt had knee surgery in may, having left leg swelling, went to pcp and sent for vascular study today, +dvt and sent here for admission.

## 2011-11-26 NOTE — Progress Notes (Signed)
VASCULAR LAB PRELIMINARY  PRELIMINARY  PRELIMINARY  PRELIMINARY  Left lower extremity venous duplex completed.    Preliminary report: Left lower extremity is positive for extensive deep vein thrombus from the distal calf to the femoral vein.  The common femoral vein is partially thrombosed on left.  No DVT noted in the right common femoral vein.  Report called to Dr. Marga Melnick.  He requested we send the patient to the [facility] ED for evaluation and treatment.    [name],   RVT 11/26/2011, 3:34 PM

## 2011-11-26 NOTE — ED Notes (Signed)
Patient presents stating he had knee surgery May 9th and since then has noticed his left leg swelling.  States he has had some chest pain off and on.  When it is there, feels like he has someone stabbing him  States he also ha some SOB at times.

## 2011-11-26 NOTE — ED Provider Notes (Signed)
History     CSN: [id number]  Arrival date & time 11/26/11  1550   First MD Initiated Contact with Patient 11/26/11 1943      Chief Complaint  Patient presents with  . Leg Swelling    (Consider location/radiation/quality/duration/timing/severity/associated sxs/prior treatment) HPI History from patient. 46 year old male who presents with left leg swelling and pain. Patient reports that he had surgery on his left knee by Dr. Magnus Ivan in May to repair a torn meniscus. Patient states that since the surgery he has had persistent edema to the lower extremity. He also complains of intermittent shortness of breath and chest pain which has been present since about a week after the surgery. He saw his primary care physician, Dr. Alwyn Ren, who sent the patient for a Doppler study of the lower extremity today. This was positive for thrombus. Patient was told to present to the emergency department for further evaluation and treatment. He states he has accompanying chest pain, located to the substernal area. It is intermittent in nature with no known aggravating or alleviating factors. He denies any chills, fever, cough, congestion. He has not had any palpitations with this.  Past Medical History  Diagnosis Date  . Cluster headache   . HTN (hypertension)     Past Surgical History  Procedure Date  . Wisdom tooth extraction   . Cardiac catheterization 2007    Left ventricular dysfunction; normal coronaries  . Upper gastrointestinal endoscopy 2007  . Arthroscopic knee surgery 10/09/2011    Dr Magnus Ivan    Family History  Problem Relation Age of Onset  . Cancer Mother     Mesothelioma   . Diabetes Brother   . Hypertension Brother   . Stroke Brother   . Diabetes Maternal Aunt   . Diabetes Maternal Grandmother   . Heart disease Maternal Grandmother   . Hypertension Maternal Grandmother   . Stroke Maternal Grandmother   . Heart disease Maternal Grandfather   . Migraines Brother     History    Substance Use Topics  . Smoking status: Never Smoker   . Smokeless tobacco: Not on file  . Alcohol Use: No      Review of Systems  Constitutional: Negative for fever, chills, activity change and appetite change.  Respiratory: Positive for chest tightness and shortness of breath.   Cardiovascular: Positive for leg swelling. Negative for palpitations.  Gastrointestinal: Negative for nausea, vomiting and abdominal pain.  Musculoskeletal: Negative for myalgias.  Skin: Negative for color change and rash.  All other systems reviewed and are negative.    Allergies  Review of patient's allergies indicates no known allergies.  Home Medications   Current Outpatient Rx  Name Route Sig Dispense Refill  . LISINOPRIL 20 MG PO TABS Oral Take 1 tablet (20 mg total) by mouth at bedtime. 30 tablet 5  . VERAPAMIL HCL 360 MG PO CP24 Oral Take 360 mg by mouth at bedtime.      BP 127/85  Pulse 68  Temp 97.5 F (36.4 C) (Oral)  Resp 18  SpO2 97%  Physical Exam  Nursing note and vitals reviewed. Constitutional: He appears well-developed and well-nourished. No distress.       Vitals stable  HENT:  Head: Normocephalic and atraumatic.  Eyes:       Normal appearance  Neck: Normal range of motion.  Cardiovascular: Normal rate, regular rhythm and normal heart sounds.   Pulmonary/Chest: Effort normal and breath sounds normal. No respiratory distress. He exhibits no tenderness.  Abdominal: Soft.  Bowel sounds are normal. There is no tenderness. There is no rebound and no guarding.  Musculoskeletal: Normal range of motion.       Mild edema and tenderness to palpation to the L calf, DP/PT pulses intact  Neurological: He is alert.  Skin: Skin is warm and dry. He is not diaphoretic.  Psychiatric: He has a normal mood and affect.    ED Course  Procedures (including critical care time)   Labs Reviewed  CBC WITH DIFFERENTIAL  BASIC METABOLIC PANEL   Dg Chest 2 View  11/25/2011  *RADIOLOGY  REPORT*  Clinical Data: Cough, shortness of breath.  CHEST - 2 VIEW  Comparison: 01/24/2010  Findings: Heart and mediastinal contours are within normal limits. No focal opacities or effusions.  No acute bony abnormality.  IMPRESSION: No active cardiopulmonary disease.  Original Report Authenticated By: Cyndie Chime, M.D.   Ct Angio Chest W/cm &/or Wo Cm  11/26/2011  *RADIOLOGY REPORT*  Clinical Data: Left leg swelling.  Knee surgery and May 8.  Left lower extremity DVT.  Shortness of breath and chest pain.  CT ANGIOGRAPHY CHEST  Technique:  Multidetector CT imaging of the chest using the standard protocol during bolus administration of intravenous contrast. Multiplanar reconstructed images including MIPs were obtained and reviewed to evaluate the vascular anatomy.  Contrast: OMNIPAQUE IOHEXOL 350 MG/ML SOLN  Comparison: 03/16/2006  Findings: Technically adequate study with good opacification of the central and segmental pulmonary arteries.  Extensive filling defects demonstrated in the distal main pulmonary artery extending into both lobar pulmonary arteries and into multiple proximal segmental branches consistent with large saddle embolus.  No focal pulmonary consolidation or infarct is noted in the lungs.  Normal heart size.  Normal caliber thoracic aorta.  No significant lymphadenopathy in the chest.  Esophagus is decompressed.  No focal airspace consolidation or interstitial change in the lungs.  No pleural effusion.  No pneumothorax.  IMPRESSION: Large saddle type pulmonary embolus with emboli extending into multiple proximal segmental arteries bilaterally.  Critical Value/emergent results were called by telephone at the time of interpretation on 11/26/2011  at 20-815 hours  to  Dr. Mayford Knife, who verbally acknowledged these results.  Original Report Authenticated By: Marlon Pel, M.D.     1. Saddle embolus of pulmonary artery   2. DVT (deep venous thrombosis)       MDM  Patient who  is status post left knee arthroscopy about 6 weeks ago who presents with leg swelling and pain and positive Doppler study. As he was complaining of shortness of breath and chest pain, we elected to proceed with CT angiography chest. This shows a large saddle type pulmonary embolus without infarction. Discussed with pulm/crit care MD at 2235 - given that patient is hemodynamically stable, has stable sats, he would defer lytics at this time. Recs admission to medicine with echo in AM to eval R heart fn. Discussed with Dr. Toniann Fail with [facility] at 2249 who accepts pt for admit to telemetry.        Grant Fontana, PA-C 11/26/11 2331

## 2011-11-26 NOTE — ED Notes (Signed)
Pt. Given Happy Meal and Sprite. Family members given snacks and cold drinks. No other needs voiced.

## 2011-11-27 ENCOUNTER — Encounter (HOSPITAL_COMMUNITY): Payer: Self-pay | Admitting: *Deleted

## 2011-11-27 DIAGNOSIS — I2699 Other pulmonary embolism without acute cor pulmonale: Secondary | ICD-10-CM

## 2011-11-27 DIAGNOSIS — I10 Essential (primary) hypertension: Secondary | ICD-10-CM

## 2011-11-27 LAB — COMPREHENSIVE METABOLIC PANEL
ALT: 25 U/L (ref 0–53)
AST: 17 U/L (ref 0–37)
Albumin: 3.5 g/dL (ref 3.5–5.2)
Alkaline Phosphatase: 56 U/L (ref 39–117)
Calcium: 9.2 mg/dL (ref 8.4–10.5)
GFR calc Af Amer: >90 mL/min (ref >90)
Potassium: 3.6 mEq/L (ref 3.5–5.1)
Sodium: 139 mEq/L (ref 135–145)
Total Protein: 7.1 g/dL (ref 6.0–8.3)

## 2011-11-27 LAB — CBC
HCT: 40.1 % (ref 39.0–52.0)
Hemoglobin: 13.9 g/dL (ref 13.0–17.0)
MCHC: 34.7 g/dL (ref 30.0–36.0)
MCV: 84.6 fL (ref 78.0–100.0)
RDW: 13 % (ref 11.5–15.5)

## 2011-11-27 LAB — CARDIAC PANEL(CRET KIN+CKTOT+MB+TROPI)
CK, MB: 1.9 ng/mL (ref 0.3–4.0)
Relative Index: 1.4 (ref 0.0–2.5)
Total CK: 136 U/L (ref 7–232)
Troponin I: <0.3 ng/mL (ref <0.30)

## 2011-11-27 MED ORDER — RIVAROXABAN 20 MG PO TABS: 20.0000 mg | ORAL_TABLET | Freq: Every day | ORAL | Status: DC

## 2011-11-27 MED ORDER — ACETAMINOPHEN 325 MG PO TABS: 650.0000 mg | ORAL_TABLET | Freq: Four times a day (QID) | ORAL | Status: DC | PRN

## 2011-11-27 MED ORDER — RIVAROXABAN 15 MG PO TABS
15.0000 mg | ORAL_TABLET | Freq: Two times a day (BID) | ORAL | Status: DC
  Administered 2011-11-27 – 2011-11-28 (×2): 15 mg via ORAL
  Filled 2011-11-27 (×5): qty 1

## 2011-11-27 MED ORDER — VERAPAMIL HCL ER 180 MG PO TBCR
360.0000 mg | EXTENDED_RELEASE_TABLET | Freq: Every day | ORAL | Status: DC
Start: 2011-11-27 — End: 2011-11-28
  Administered 2011-11-27 – 2011-11-28 (×2): 360 mg via ORAL
  Filled 2011-11-27 (×2): qty 2

## 2011-11-27 MED ORDER — ONDANSETRON HCL 4 MG PO TABS: 4.0000 mg | ORAL_TABLET | Freq: Four times a day (QID) | ORAL | Status: DC | PRN

## 2011-11-27 MED ORDER — SODIUM CHLORIDE 0.9 % IV SOLN
INTRAVENOUS | Status: DC
  Administered 2011-11-27: 04:00:00 via INTRAVENOUS

## 2011-11-27 MED ORDER — ONDANSETRON HCL 4 MG/2ML IJ SOLN: 4.0000 mg | Freq: Four times a day (QID) | INTRAMUSCULAR | Status: DC | PRN

## 2011-11-27 MED ORDER — ONDANSETRON HCL 4 MG/2ML IJ SOLN: 4.0000 mg | Freq: Three times a day (TID) | INTRAMUSCULAR | Status: DC | PRN

## 2011-11-27 MED ORDER — SENNA 8.6 MG PO TABS
2.0000 | ORAL_TABLET | Freq: Every day | ORAL | Status: DC
  Administered 2011-11-27: 17.2 mg via ORAL
  Filled 2011-11-27 (×2): qty 2

## 2011-11-27 MED ORDER — OXYCODONE-ACETAMINOPHEN 5-325 MG PO TABS
1.0000 | ORAL_TABLET | Freq: Once | ORAL | Status: AC
  Administered 2011-11-27: 1 via ORAL
  Filled 2011-11-27: qty 1

## 2011-11-27 MED ORDER — OXYCODONE-ACETAMINOPHEN 5-325 MG PO TABS
1.0000 | ORAL_TABLET | ORAL | Status: DC | PRN
  Administered 2011-11-27 (×2): 1 via ORAL
  Filled 2011-11-27 (×2): qty 1

## 2011-11-27 MED ORDER — SODIUM CHLORIDE 0.9 % IV SOLN: INTRAVENOUS | Status: DC

## 2011-11-27 MED ORDER — LISINOPRIL 20 MG PO TABS
20.0000 mg | ORAL_TABLET | Freq: Every day | ORAL | Status: DC
  Administered 2011-11-27 (×2): 20 mg via ORAL
  Filled 2011-11-27 (×3): qty 1

## 2011-11-27 MED ORDER — OXYCODONE-ACETAMINOPHEN 5-325 MG PO TABS: 1.0000 | ORAL_TABLET | ORAL | Status: DC | PRN

## 2011-11-27 MED ORDER — ENOXAPARIN SODIUM 120 MG/0.8ML ~~LOC~~ SOLN
120.0000 mg | Freq: Two times a day (BID) | SUBCUTANEOUS | Status: DC
  Administered 2011-11-27: 120 mg via SUBCUTANEOUS
  Filled 2011-11-27 (×2): qty 0.8

## 2011-11-27 MED ORDER — HYDROMORPHONE HCL PF 1 MG/ML IJ SOLN: 0.5000 mg | INTRAMUSCULAR | Status: DC | PRN

## 2011-11-27 MED ORDER — SODIUM CHLORIDE 0.9 % IJ SOLN
3.0000 mL | Freq: Two times a day (BID) | INTRAMUSCULAR | Status: DC
  Administered 2011-11-27 – 2011-11-28 (×3): 3 mL via INTRAVENOUS

## 2011-11-27 MED ORDER — DOCUSATE SODIUM 100 MG PO CAPS
100.0000 mg | ORAL_CAPSULE | Freq: Two times a day (BID) | ORAL | Status: DC
  Administered 2011-11-27 – 2011-11-28 (×2): 100 mg via ORAL
  Filled 2011-11-27 (×3): qty 1

## 2011-11-27 MED ORDER — ACETAMINOPHEN 650 MG RE SUPP: 650.0000 mg | Freq: Four times a day (QID) | RECTAL | Status: DC | PRN

## 2011-11-27 MED ORDER — HYDROCODONE-ACETAMINOPHEN 5-325 MG PO TABS
1.0000 | ORAL_TABLET | ORAL | Status: DC | PRN
Start: 2011-11-27 — End: 2011-11-28
  Administered 2011-11-27: 2 via ORAL
  Administered 2011-11-27: 1 via ORAL
  Administered 2011-11-28: 2 via ORAL
  Filled 2011-11-27 (×3): qty 2

## 2011-11-27 NOTE — Progress Notes (Signed)
ANTICOAGULATION CONSULT NOTE - Initial Consult  Pharmacy Consult for Lovenox Indication: pulmonary embolus  No Known Allergies  Patient Measurements: Height: 5\' 6"  (167.6 cm) Weight: 253 lb 4.9 oz (114.9 kg) IBW/kg (Calculated) : 63.8   Vital Signs: Temp: 96.8 F (36 C) (06/27 0122) Temp src: Oral (06/27 0122) BP: 132/92 mmHg (06/27 0126) Pulse Rate: 70  (06/27 0122)  Labs:  [facility] 11/26/11 2011 11/25/11 0951  HGB 13.8 14.1  HCT 41.1 42.8  PLT 270 278.0  APTT -- --  LABPROT -- --  INR -- --  HEPARINUNFRC -- --  CREATININE 0.91 0.8  CKTOTAL -- --  CKMB -- --  TROPONINI -- --    Estimated Creatinine Clearance: 120.8 ml/min (by C-G formula based on Cr of 0.91).   Medical History: Past Medical History  Diagnosis Date  . Cluster headache   . HTN (hypertension)     Medications:  Prescriptions prior to admission  Medication Sig Dispense Refill  . lisinopril (PRINIVIL,ZESTRIL) 20 MG tablet Take 1 tablet (20 mg total) by mouth at bedtime.  30 tablet  5  . verapamil (VERELAN PM) 360 MG 24 hr capsule Take 360 mg by mouth at bedtime.        Assessment: 46 yo male with PE for Lovenox.  Lovenox 120 mg SQ given in ED at 11 pm 6/26  Goal of Therapy:  Anti-Xa level 0.6-1.2 units/ml 4hrs after LMWH dose given Monitor platelets by anticoagulation protocol: Yes   Plan:  Lovenox 120 mg SQ q12h F/U plan for anticoagulation  Mariaisabel Bodiford, Gary Fleet 11/27/2011,1:35 AM  1

## 2011-11-27 NOTE — Progress Notes (Signed)
Subjective: Patient short of breath but feeling better. Still having leg pain.  Objective: Vital signs in last 24 hours: Filed Vitals:   11/27/11 0126 11/27/11 0617 11/27/11 1040 11/27/11 1319  BP: 132/92 106/80 130/85 120/82  Pulse:  71  73  Temp:  97.4 F (36.3 C)  97.5 F (36.4 C)  TempSrc:  Oral    Resp:  18  18  Height:      Weight:      SpO2:  95%  95%   Weight change:   Intake/Output Summary (Last 24 hours) at 11/27/11 1449 Last data filed at 11/27/11 1300  Gross per 24 hour  Intake 746.67 ml  Output   2125 ml  Net -1378.33 ml    Physical Exam: General: Awake, Oriented, No acute distress. HEENT: EOMI. Neck: Supple CV: S1 and S2 Lungs: Clear to ascultation bilaterally Abdomen: Soft, Nontender, Nondistended, +bowel sounds. Ext: Good pulses. LLE slight more swollen than right.  Lab Results: Basic Metabolic Panel:  Lab 11/27/11 1610 11/26/11 2011 11/25/11 0951  NA 139 139 139  K 3.6 3.7 4.0  CL 103 102 107  CO2 26 27 25   GLUCOSE 93 88 98  BUN 10 11 14   CREATININE 0.84 0.91 0.8  CALCIUM 9.2 9.4 9.2  MG -- -- --  PHOS -- -- --   Liver Function Tests:  Lab 11/27/11 0143  AST 17  ALT 25  ALKPHOS 56  BILITOT 0.3  PROT 7.1  ALBUMIN 3.5   No results found for this basename: LIPASE:5,AMYLASE:5 in the last 168 hours No results found for this basename: AMMONIA:5 in the last 168 hours CBC:  Lab 11/27/11 0143 11/26/11 2011 11/25/11 0951  WBC 11.0* 10.0 8.9  NEUTROABS -- 5.5 5.5  HGB 13.9 13.8 14.1  HCT 40.1 41.1 42.8  MCV 84.6 85.1 87.9  PLT 270 270 278.0   Cardiac Enzymes:  Lab 11/27/11 0137  CKTOTAL 136  CKMB 1.9  CKMBINDEX --  TROPONINI <0.30   BNP (last 3 results)  Basename 11/25/11 0951  PROBNP 11.0   CBG: No results found for this basename: GLUCAP:5 in the last 168 hours No results found for this basename: HGBA1C:5 in the last 72 hours Other Labs: No components found with this basename: POCBNP:3  Lab 11/25/11 0951  DDIMER  6.53*   No results found for this basename: CHOL:2,HDL:2,LDLCALC:2,TRIG:2,CHOLHDL:2,LDLDIRECT:2 in the last 168 hours No results found for this basename: TSH,T4TOTAL,FREET3,T3FREE,FREET4,THYROIDAB in the last 168 hours No results found for this basename: VITAMINB12:2,FOLATE:2,FERRITIN:2,TIBC:2,IRON:2,RETICCTPCT:2 in the last 168 hours  Micro Results: No results found for this or any previous visit (from the past 240 hour(s)).  Studies/Results: Ct Angio Chest W/cm &/or Wo Cm  11/26/2011  *RADIOLOGY REPORT*  Clinical Data: Left leg swelling.  Knee surgery and May 8.  Left lower extremity DVT.  Shortness of breath and chest pain.  CT ANGIOGRAPHY CHEST  Technique:  Multidetector CT imaging of the chest using the standard protocol during bolus administration of intravenous contrast. Multiplanar reconstructed images including MIPs were obtained and reviewed to evaluate the vascular anatomy.  Contrast: OMNIPAQUE IOHEXOL 350 MG/ML SOLN  Comparison: 03/16/2006  Findings: Technically adequate study with good opacification of the central and segmental pulmonary arteries.  Extensive filling defects demonstrated in the distal main pulmonary artery extending into both lobar pulmonary arteries and into multiple proximal segmental branches consistent with large saddle embolus.  No focal pulmonary consolidation or infarct is noted in the lungs.  Normal heart size.  Normal caliber  thoracic aorta.  No significant lymphadenopathy in the chest.  Esophagus is decompressed.  No focal airspace consolidation or interstitial change in the lungs.  No pleural effusion.  No pneumothorax.  IMPRESSION: Large saddle type pulmonary embolus with emboli extending into multiple proximal segmental arteries bilaterally.  Critical Value/emergent results were called by telephone at the time of interpretation on 11/26/2011  at 20-815 hours  to  Dr. Mayford Knife, who verbally acknowledged these results.  Original Report Authenticated By:  Marlon Pel, M.D.    Medications: I have reviewed the patient's current medications. Scheduled Meds:   . enoxaparin (LOVENOX) injection  120 mg Subcutaneous Once  . lisinopril  20 mg Oral QHS  .  morphine injection  4 mg Intravenous Once  . ondansetron (ZOFRAN) IV  4 mg Intravenous Once  . oxyCODONE-acetaminophen  1 tablet Oral Once  . rivaroxaban  15 mg Oral BID   Followed by  . rivaroxaban  20 mg Oral Q breakfast  . sodium chloride  3 mL Intravenous Q12H  . verapamil  360 mg Oral Daily  . DISCONTD: sodium chloride   Intravenous STAT  . DISCONTD: enoxaparin  1 mg/kg Subcutaneous Once  . DISCONTD: enoxaparin (LOVENOX) injection  120 mg Subcutaneous Q12H   Continuous Infusions:   . DISCONTD: sodium chloride 100 mL/hr at 11/27/11 0420   PRN Meds:.acetaminophen, acetaminophen, iohexol, ondansetron (ZOFRAN) IV, ondansetron, oxyCODONE-acetaminophen, DISCONTD:  HYDROmorphone (DILAUDID) injection, DISCONTD: ondansetron (ZOFRAN) IV, DISCONTD: oxyCODONE-acetaminophen  Assessment/Plan: Large saddle type pulmonary embolus with emboli extending into multiple proximal segmental arteries bilaterally Started the patient on lovenox which was transitioned to Rivaroxaban today. 2D-ECHO pending. Wean off oxygen. No events on telemetry.  DVT from left distal calf to femoral vein, left common femoral vein is partially thrombosed On Rivaroxaban as indicated above.  Hypertension Stable. Continue lisinopril and verapamil.  Recent arthroscopic surgery for left knee meniscal tear Stable. Will request PT evaluation.  Disposition Pending, 2D-ECHO. Consider DC in 1-2 days pending Echo results.   LOS: 1 day  Keyundra Fant A, MD 11/27/2011, 2:49 PM

## 2011-11-27 NOTE — Care Management Note (Unsigned)
    Page 1 of 2   11/28/2011     10:54:32 AM   CARE MANAGEMENT NOTE 11/28/2011  Patient:  RAINN, BULLINGER   Account Number:  [id number]  Date Initiated:  11/27/2011  Documentation initiated by:  SIMMONS,Kadelyn Dimascio  Subjective/Objective Assessment:   ADMITTED WITH PE/ DVT; LIVES AT HOME WITH FIANCE- NICOLE; WAS IPTA BUT USES A CANE "EVERY SO OFTEN"; USES [facility] ON [address] FOR RX.     Action/Plan:   DISCHARGE PLANNING DISCUSSED AT BEDSIDE.   Anticipated DC Date:  11/29/2011   Anticipated DC Plan:  HOME/SELF CARE      DC Planning Services  CM consult      Choice offered to / List presented to:             Status of service:  In process, will continue to follow Medicare Important Message given?   (If response is "NO", the following Medicare IM given date fields will be blank) Date Medicare IM given:   Date Additional Medicare IM given:    Discharge Disposition:    Per UR Regulation:  Reviewed for med. necessity/level of care/duration of stay  If discussed at Long Length of Stay Meetings, dates discussed:    Comments:  11/28/11  0843  Twinkle Sockwell SIMMONS RN, BSN [phone] PER REP AT [facility] XARELTO IS A TIER 2 15MG  HAS QUANTITY LIMIT OF 60 FOR 30 DAYS, $50.00 CO-PAY20MG  HAS QUANTITY LIMIT OF 30 FOR 30 DAYS, ALSO HAS $50.00 CO-PAY  THESE WILL NEED TO BE WRITTEN AS 2 SEPARATE PRESCRIPTIONS DUE TO THE QUANTITY LIMITS  PROVIDED PT WITH XARELTO CO-PAY CARD.  PER PT- NO NEEDS IDENTIFIED ONLY TO RETRIEVE CRUTCHES FROM HIS SISTER AND TO RESUME OP PT    11/27/11 0903  Carter Kaman SIMMONS RN, BSN [phone] NCM WILL FOLLOW.

## 2011-11-27 NOTE — H&P (Signed)
[name] is an 46 y.o. male.  PCP - Dr.William Alwyn Ren.  Chief Complaint: Left lower extremity swelling. HPI: 46 year-old male with history of hypertension has been experiencing swelling of the left lower extremity for last few months. Patient had an arthroscopic surgery of his left knee 2 months ago for meniscal tear. Last few days patient noted that his swelling of his left lower extremity has worsened. He went to his primary care yesterday with concerns for his left lower extremity swelling. His PCP had ordered a Doppler which was positive for DVT and was referred to the ER for further management. Patient had complained of having exertional shortness of breath over the last few days. A CT angiogram of the chest was done which shows pulmonary embolus some. Pulmonary critical care was consulted by the ER physician given the large saddle embolus. At this time critical care fell that patient is hemodynamic stable and can be managed in telemetry. They have requested a 2-D echo. Patient at this time has received Lovenox injection. Patient otherwise denies any chest pain and is not in acute distress.  Past Medical History  Diagnosis Date  . Cluster headache   . HTN (hypertension)     Past Surgical History  Procedure Date  . Wisdom tooth extraction   . Cardiac catheterization 2007    Left ventricular dysfunction; normal coronaries  . Upper gastrointestinal endoscopy 2007  . Arthroscopic knee surgery 10/09/2011    Dr Magnus Ivan    Family History  Problem Relation Age of Onset  . Cancer Mother     Mesothelioma   . Diabetes Brother   . Hypertension Brother   . Stroke Brother   . Diabetes Maternal Aunt   . Diabetes Maternal Grandmother   . Heart disease Maternal Grandmother   . Hypertension Maternal Grandmother   . Stroke Maternal Grandmother   . Heart disease Maternal Grandfather   . Migraines Brother    Social History:  reports that he has never smoked. He does not have any  smokeless tobacco history on file. He reports that he does not drink alcohol or use illicit drugs.  Allergies: No Known Allergies   (Not in a hospital admission)  Results for orders placed during the hospital encounter of 11/26/11 (from the past 48 hour(s))  CBC WITH DIFFERENTIAL     Status: Normal   Collection Time   11/26/11  8:11 PM      Component Value Range Comment   WBC 10.0  4.0 - 10.5 K/uL    RBC 4.83  4.22 - 5.81 MIL/uL    Hemoglobin 13.8  13.0 - 17.0 g/dL    HCT 78.2  95.6 - 21.3 %    MCV 85.1  78.0 - 100.0 fL    MCH 28.6  26.0 - 34.0 pg    MCHC 33.6  30.0 - 36.0 g/dL    RDW 08.6  57.8 - 46.9 %    Platelets 270  150 - 400 K/uL    Neutrophils Relative 54  43 - 77 %    Neutro Abs 5.5  1.7 - 7.7 K/uL    Lymphocytes Relative 33  12 - 46 %    Lymphs Abs 3.3  0.7 - 4.0 K/uL    Monocytes Relative 8  3 - 12 %    Monocytes Absolute 0.8  0.1 - 1.0 K/uL    Eosinophils Relative 5  0 - 5 %    Eosinophils Absolute 0.5  0.0 - 0.7 K/uL  Basophils Relative 0  0 - 1 %    Basophils Absolute 0.0  0.0 - 0.1 K/uL   BASIC METABOLIC PANEL     Status: Normal   Collection Time   11/26/11  8:11 PM      Component Value Range Comment   Sodium 139  135 - 145 mEq/L    Potassium 3.7  3.5 - 5.1 mEq/L    Chloride 102  96 - 112 mEq/L    CO2 27  19 - 32 mEq/L    Glucose, Bld 88  70 - 99 mg/dL    BUN 11  6 - 23 mg/dL    Creatinine, Ser 1.61  0.50 - 1.35 mg/dL    Calcium 9.4  8.4 - 09.6 mg/dL    GFR calc non Af Amer >90  >90 mL/min    GFR calc Af Amer >90  >90 mL/min    Dg Chest 2 View  11/25/2011  *RADIOLOGY REPORT*  Clinical Data: Cough, shortness of breath.  CHEST - 2 VIEW  Comparison: 01/24/2010  Findings: Heart and mediastinal contours are within normal limits. No focal opacities or effusions.  No acute bony abnormality.  IMPRESSION: No active cardiopulmonary disease.  Original Report Authenticated By: Cyndie Chime, M.D.   Ct Angio Chest W/cm &/or Wo Cm  11/26/2011  *RADIOLOGY REPORT*   Clinical Data: Left leg swelling.  Knee surgery and May 8.  Left lower extremity DVT.  Shortness of breath and chest pain.  CT ANGIOGRAPHY CHEST  Technique:  Multidetector CT imaging of the chest using the standard protocol during bolus administration of intravenous contrast. Multiplanar reconstructed images including MIPs were obtained and reviewed to evaluate the vascular anatomy.  Contrast: OMNIPAQUE IOHEXOL 350 MG/ML SOLN  Comparison: 03/16/2006  Findings: Technically adequate study with good opacification of the central and segmental pulmonary arteries.  Extensive filling defects demonstrated in the distal main pulmonary artery extending into both lobar pulmonary arteries and into multiple proximal segmental branches consistent with large saddle embolus.  No focal pulmonary consolidation or infarct is noted in the lungs.  Normal heart size.  Normal caliber thoracic aorta.  No significant lymphadenopathy in the chest.  Esophagus is decompressed.  No focal airspace consolidation or interstitial change in the lungs.  No pleural effusion.  No pneumothorax.  IMPRESSION: Large saddle type pulmonary embolus with emboli extending into multiple proximal segmental arteries bilaterally.  Critical Value/emergent results were called by telephone at the time of interpretation on 11/26/2011  at 20-815 hours  to  Dr. Mayford Knife, who verbally acknowledged these results.  Original Report Authenticated By: Marlon Pel, M.D.    Review of Systems  Constitutional: Negative.   HENT: Negative.   Eyes: Negative.   Respiratory: Positive for shortness of breath.   Cardiovascular: Negative.   Gastrointestinal: Negative.   Genitourinary: Negative.   Musculoskeletal:       Left lower extremity swelling and pain.  Skin: Negative.   Neurological: Negative.   Psychiatric/Behavioral: Negative.     Blood pressure 116/77, pulse 75, temperature 97.5 F (36.4 C), temperature source Oral, resp. rate 25, SpO2  94.00%. Physical Exam  Constitutional: He is oriented to person, place, and time. He appears well-developed and well-nourished. No distress.  HENT:  Head: Normocephalic and atraumatic.  Right Ear: External ear normal.  Left Ear: External ear normal.  Nose: Nose normal.  Mouth/Throat: Oropharynx is clear and moist. No oropharyngeal exudate.  Eyes: Conjunctivae are normal. Pupils are equal, round, and reactive to light.  Right eye exhibits no discharge. Left eye exhibits no discharge.  Neck: Normal range of motion. Neck supple.  Cardiovascular: Normal rate and regular rhythm.   Respiratory: Effort normal and breath sounds normal. No respiratory distress. He has no wheezes. He has no rales.  GI: Soft. Bowel sounds are normal. He exhibits no distension. There is no tenderness. There is no rebound.  Musculoskeletal:       Swelling of the left lower extremity.  Neurological: He is alert and oriented to person, place, and time. No cranial nerve deficit. Coordination normal.  Skin: Skin is warm and dry. He is not diaphoretic.  Psychiatric: His behavior is normal.     Assessment/Plan #1. Pulmonary embolism with DVT of left lower extremity - patient states he has not been moving well after he had the knee problem. Probably this could be the predisposing factor for his DVT and PE. At this time patient is started on full dose Lovenox. May slowly change to a xarelto. At this time patient is hemodynamically stable. Check 2-D echo. #2. Hypertension - continue present medications.  CODE STATUS - full code.  Eduard Clos. 11/27/2011, 12:39 AM

## 2011-11-28 DIAGNOSIS — I10 Essential (primary) hypertension: Secondary | ICD-10-CM

## 2011-11-28 DIAGNOSIS — I369 Nonrheumatic tricuspid valve disorder, unspecified: Secondary | ICD-10-CM

## 2011-11-28 DIAGNOSIS — I2699 Other pulmonary embolism without acute cor pulmonale: Secondary | ICD-10-CM

## 2011-11-28 LAB — CBC
HCT: 40.2 % (ref 39.0–52.0)
Hemoglobin: 13.4 g/dL (ref 13.0–17.0)
MCV: 86.5 fL (ref 78.0–100.0)
Platelets: 277 10*3/uL (ref 150–400)
RBC: 4.65 MIL/uL (ref 4.22–5.81)
WBC: 7.3 10*3/uL (ref 4.0–10.5)

## 2011-11-28 MED ORDER — UNABLE TO FIND: Status: DC

## 2011-11-28 MED ORDER — SENNA 8.6 MG PO TABS: 2.0000 | ORAL_TABLET | Freq: Every day | ORAL | Status: DC

## 2011-11-28 MED ORDER — HYDROCODONE-ACETAMINOPHEN 5-325 MG PO TABS: 1.0000 | ORAL_TABLET | ORAL | Status: DC | PRN

## 2011-11-28 MED ORDER — RIVAROXABAN 15 MG PO TABS: ORAL_TABLET | ORAL | Status: DC

## 2011-11-28 MED ORDER — DSS 100 MG PO CAPS: 100.0000 mg | ORAL_CAPSULE | Freq: Two times a day (BID) | ORAL | Status: DC

## 2011-11-28 NOTE — Discharge Summary (Signed)
Discharge Summary  [name] MR#: [id number]  DOB:1965-08-16  Date of Admission: 11/26/2011 Date of Discharge: 11/28/2011  Patient's PCP: Marga Melnick, MD  Attending Physician:Deundra Bard A  Consults: None  Discharge Diagnoses: Principal Problem:  *PE (pulmonary embolism) Active Problems:  HYPERLIPIDEMIA  HYPERTENSION   Brief Admitting History and Physical On admission "46 year-old male with history of hypertension has been experiencing swelling of the left lower extremity for last few months presented on 11/27/2011."  Discharge Medications Medication List  As of 11/28/2011  3:13 PM   TAKE these medications         DSS 100 MG Caps   Take 100 mg by mouth 2 (two) times daily.      HYDROcodone-acetaminophen 5-325 MG per tablet   Commonly known as: NORCO   Take 1-2 tablets by mouth every 4 (four) hours as needed.      lisinopril 20 MG tablet   Commonly known as: PRINIVIL,ZESTRIL   Take 1 tablet (20 mg total) by mouth at bedtime.      Rivaroxaban 15 MG Tabs tablet   Commonly known as: XARELTO   15 mg twice daily until Fri 12/19/11 then 20 mg daily there after.      senna 8.6 MG Tabs   Commonly known as: SENOKOT   Take 2 tablets (17.2 mg total) by mouth at bedtime.      UNABLE TO FIND   Outpatient physical therapy for DVT.      verapamil 360 MG 24 hr capsule   Commonly known as: VERELAN PM   Take 360 mg by mouth at bedtime.            Hospital Course: Large saddle type pulmonary embolus with emboli extending into multiple proximal segmental arteries bilaterally  Started the patient on lovenox which was transitioned to Rivaroxaban on 11/28/2011. 2D-ECHO reviewed as indicated below. Weaned off oxygen. No events on telemetry.   DVT from left distal calf to femoral vein, left common femoral vein is partially thrombosed  On Rivaroxaban as indicated above.   Hypertension  Stable. Continue lisinopril and verapamil.   Recent arthroscopic surgery for left  knee meniscal tear  Evaluated by physical therapy, recommended continuing outpatient physical therapy.  Day of Discharge BP 123/82  Pulse 83  Temp 97.9 F (36.6 C) (Oral)  Resp 20  Ht 5\' 6"  (1.676 m)  Wt 114.9 kg (253 lb 4.9 oz)  BMI 40.89 kg/m2  SpO2 96%  Results for orders placed during the hospital encounter of 11/26/11 (from the past 48 hour(s))  CBC WITH DIFFERENTIAL     Status: Normal   Collection Time   11/26/11  8:11 PM      Component Value Range Comment   WBC 10.0  4.0 - 10.5 K/uL    RBC 4.83  4.22 - 5.81 MIL/uL    Hemoglobin 13.8  13.0 - 17.0 g/dL    HCT 13.0  86.5 - 78.4 %    MCV 85.1  78.0 - 100.0 fL    MCH 28.6  26.0 - 34.0 pg    MCHC 33.6  30.0 - 36.0 g/dL    RDW 69.6  29.5 - 28.4 %    Platelets 270  150 - 400 K/uL    Neutrophils Relative 54  43 - 77 %    Neutro Abs 5.5  1.7 - 7.7 K/uL    Lymphocytes Relative 33  12 - 46 %    Lymphs Abs 3.3  0.7 - 4.0 K/uL  Monocytes Relative 8  3 - 12 %    Monocytes Absolute 0.8  0.1 - 1.0 K/uL    Eosinophils Relative 5  0 - 5 %    Eosinophils Absolute 0.5  0.0 - 0.7 K/uL    Basophils Relative 0  0 - 1 %    Basophils Absolute 0.0  0.0 - 0.1 K/uL   BASIC METABOLIC PANEL     Status: Normal   Collection Time   11/26/11  8:11 PM      Component Value Range Comment   Sodium 139  135 - 145 mEq/L    Potassium 3.7  3.5 - 5.1 mEq/L    Chloride 102  96 - 112 mEq/L    CO2 27  19 - 32 mEq/L    Glucose, Bld 88  70 - 99 mg/dL    BUN 11  6 - 23 mg/dL    Creatinine, Ser 4.09  0.50 - 1.35 mg/dL    Calcium 9.4  8.4 - 81.1 mg/dL    GFR calc non Af Amer >90  >90 mL/min    GFR calc Af Amer >90  >90 mL/min   CARDIAC PANEL(CRET KIN+CKTOT+MB+TROPI)     Status: Normal   Collection Time   11/27/11  1:37 AM      Component Value Range Comment   Total CK 136  7 - 232 U/L    CK, MB 1.9  0.3 - 4.0 ng/mL    Troponin I <0.30  <0.30 ng/mL    Relative Index 1.4  0.0 - 2.5   COMPREHENSIVE METABOLIC PANEL     Status: Normal   Collection Time    11/27/11  1:43 AM      Component Value Range Comment   Sodium 139  135 - 145 mEq/L    Potassium 3.6  3.5 - 5.1 mEq/L    Chloride 103  96 - 112 mEq/L    CO2 26  19 - 32 mEq/L    Glucose, Bld 93  70 - 99 mg/dL    BUN 10  6 - 23 mg/dL    Creatinine, Ser 9.14  0.50 - 1.35 mg/dL    Calcium 9.2  8.4 - 78.2 mg/dL    Total Protein 7.1  6.0 - 8.3 g/dL    Albumin 3.5  3.5 - 5.2 g/dL    AST 17  0 - 37 U/L    ALT 25  0 - 53 U/L    Alkaline Phosphatase 56  39 - 117 U/L    Total Bilirubin 0.3  0.3 - 1.2 mg/dL    GFR calc non Af Amer >90  >90 mL/min    GFR calc Af Amer >90  >90 mL/min   CBC     Status: Abnormal   Collection Time   11/27/11  1:43 AM      Component Value Range Comment   WBC 11.0 (*) 4.0 - 10.5 K/uL    RBC 4.74  4.22 - 5.81 MIL/uL    Hemoglobin 13.9  13.0 - 17.0 g/dL    HCT 95.6  21.3 - 08.6 %    MCV 84.6  78.0 - 100.0 fL    MCH 29.3  26.0 - 34.0 pg    MCHC 34.7  30.0 - 36.0 g/dL    RDW 57.8  46.9 - 62.9 %    Platelets 270  150 - 400 K/uL   CBC     Status: Normal   Collection Time   11/28/11  5:00  AM      Component Value Range Comment   WBC 7.3  4.0 - 10.5 K/uL    RBC 4.65  4.22 - 5.81 MIL/uL    Hemoglobin 13.4  13.0 - 17.0 g/dL    HCT 16.1  09.6 - 04.5 %    MCV 86.5  78.0 - 100.0 fL    MCH 28.8  26.0 - 34.0 pg    MCHC 33.3  30.0 - 36.0 g/dL    RDW 40.9  81.1 - 91.4 %    Platelets 277  150 - 400 K/uL     Dg Chest 2 View  11/25/2011  *RADIOLOGY REPORT*  Clinical Data: Cough, shortness of breath.  CHEST - 2 VIEW  Comparison: 01/24/2010  Findings: Heart and mediastinal contours are within normal limits. No focal opacities or effusions.  No acute bony abnormality.  IMPRESSION: No active cardiopulmonary disease.  Original Report Authenticated By: Cyndie Chime, M.D.   Ct Angio Chest W/cm &/or Wo Cm  11/26/2011  *RADIOLOGY REPORT*  Clinical Data: Left leg swelling.  Knee surgery and May 8.  Left lower extremity DVT.  Shortness of breath and chest pain.  CT ANGIOGRAPHY CHEST   Technique:  Multidetector CT imaging of the chest using the standard protocol during bolus administration of intravenous contrast. Multiplanar reconstructed images including MIPs were obtained and reviewed to evaluate the vascular anatomy.  Contrast: OMNIPAQUE IOHEXOL 350 MG/ML SOLN  Comparison: 03/16/2006  Findings: Technically adequate study with good opacification of the central and segmental pulmonary arteries.  Extensive filling defects demonstrated in the distal main pulmonary artery extending into both lobar pulmonary arteries and into multiple proximal segmental branches consistent with large saddle embolus.  No focal pulmonary consolidation or infarct is noted in the lungs.  Normal heart size.  Normal caliber thoracic aorta.  No significant lymphadenopathy in the chest.  Esophagus is decompressed.  No focal airspace consolidation or interstitial change in the lungs.  No pleural effusion.  No pneumothorax.  IMPRESSION: Large saddle type pulmonary embolus with emboli extending into multiple proximal segmental arteries bilaterally.  Critical Value/emergent results were called by telephone at the time of interpretation on 11/26/2011  at 20-815 hours  to  Dr. Mayford Knife, who verbally acknowledged these results.  Original Report Authenticated By: Marlon Pel, M.D.   2-D echocardiogram on 11/27/2013\  Left ventricle: The cavity size was normal. Wall thickness was normal. Systolic function was normal. The estimated ejection fraction was in the range of 55% to 60%. Wall motion was normal; there were no regional wall motion abnormalities.   Disposition: Home with outpatient physical therapty  Diet: Heart healthy diet  Activity: Resume as tolerated   Follow-up Appts: Discharge Orders    Future Appointments: Provider: Department: Dept Phone: Center:   12/05/2011 10:00 AM Wanda Plump, MD [facility] [phone] [facility]     Future Orders Please Complete By Expires   Diet - low sodium heart  healthy      Increase activity slowly      Discharge instructions      Comments:   Followup with Marga Melnick, MD (PCP) in 1 week.      TESTS THAT NEED FOLLOW-UP None  Time spent on discharge, talking to the patient, and coordinating care: 35 mins.   Signed: Cristal Ford, MD 11/28/2011, 3:13 PM

## 2011-11-28 NOTE — Evaluation (Signed)
Physical Therapy Evaluation Patient Details Name: [name] CHECK MRN: [id number] DOB: November 26, 1965 Today's Date: 11/28/2011 Time: 7829-5621 PT Time Calculation (min): 33 min  PT Assessment / Plan / Recommendation Clinical Impression  Pt adm with PE and LLE DVT.  Pt with lt knee scope in early May.  Pt weakness in LLE after surgery.  Recommended to pt that he resume use of his crutches instead of cane until lt knee stops buckling.  Pt to resume his OPPT after DC.    PT Assessment  All further PT needs can be met in the next venue of care    Follow Up Recommendations  Outpatient PT (already being seen)    Barriers to Discharge        Equipment Recommendations  None recommended by PT    Recommendations for Other Services     Frequency      Precautions / Restrictions Restrictions Weight Bearing Restrictions: No   Pertinent Vitals/Pain SaO2 97% on RA after amb.  Left O2 off and nursing aware.      Mobility  Bed Mobility Bed Mobility: Supine to Sit;Sitting - Scoot to Edge of Bed Supine to Sit: 7: Independent Sitting - Scoot to [place] of Bed: 7: Independent Transfers Transfers: Sit to Stand;Stand to Sit Sit to Stand: 6: Modified independent (Device/Increase time) Stand to Sit: 6: Modified independent (Device/Increase time) Ambulation/Gait Ambulation/Gait Assistance: 5: Supervision;6: Modified independent (Device/Increase time) Ambulation Distance (Feet): 120 Feet Assistive device: Straight cane;Crutches Ambulation/Gait Assistance Details: Requires supervision with cane due to lt knee buckling.  With crutches Mod I. Gait Pattern: Left flexed knee in stance (lt knee buckling while using the cane.)    Exercises General Exercises - Lower Extremity Quad Sets: Left;10 reps;Supine Long Arc Quad: Left;10 reps;Seated   PT Diagnosis: Difficulty walking  PT Problem List: Decreased strength PT Treatment Interventions:     PT Goals    Visit Information  Last PT Received On:  11/28/11 Assistance Needed: +1    Subjective Data  Subjective: Pt states he gave his crutches to his sister but he can get them back. Patient Stated Goal: Return home   Prior Functioning  Home Living Lives With: Significant other Available Help at Discharge: Family;Available PRN/intermittently Type of Home: House Home Access: Stairs to enter [employer] of Steps: 3 Entrance Stairs-Rails: None Home Layout: One level Home Adaptive Equipment: Straight cane;Crutches Additional Comments: Had loaned crutches to sister but can get them back. Prior Function Level of Independence: Independent with assistive device(s) (used cane for long distances since lt knee surgery in May) Able to Take Stairs?: Yes Driving: Yes Vocation: Full time employment (Out of work since May knee surgery) Communication Communication: No difficulties    Cognition  Overall Cognitive Status: Appears within functional limits for tasks assessed/performed Arousal/Alertness: Awake/alert Orientation Level: Appears intact for tasks assessed Behavior During Session: [facility] for tasks performed    Extremity/Trunk Assessment Right Lower Extremity Assessment RLE ROM/Strength/Tone: Within functional levels Left Lower Extremity Assessment LLE ROM/Strength/Tone: Deficits LLE ROM/Strength/Tone Deficits: hip 4/5, knee 3/5   Balance    End of Session PT - End of Session Activity Tolerance: Patient tolerated treatment well Patient left: in bed;with call bell/phone within reach Nurse Communication: Mobility status  GP     [facility] 11/28/2011, 10:31 AM  [facility] PT [phone]

## 2011-11-28 NOTE — Progress Notes (Signed)
1650 - pt d/c home with instructions, r/x, and f/u appointments recommendation.  Pt and fiance verbalized understanding of instructions, pt home with family, escorted out by RN.  Ninetta Lights RN

## 2011-11-28 NOTE — Progress Notes (Signed)
Subjective: No specific complaint. Feeling better.  Objective: Vital signs in last 24 hours: Filed Vitals:   11/28/11 0943 11/28/11 0950 11/28/11 1047 11/28/11 1400  BP:   119/80 123/82  Pulse:    83  Temp:    97.9 F (36.6 C)  TempSrc:    Oral  Resp:    20  Height:      Weight:      SpO2: 94% 97%  96%   Weight change:   Intake/Output Summary (Last 24 hours) at 11/28/11 1507 Last data filed at 11/28/11 1300  Gross per 24 hour  Intake    843 ml  Output    600 ml  Net    243 ml    Physical Exam: General: Awake, Oriented, No acute distress. HEENT: EOMI. Neck: Supple CV: S1 and S2 Lungs: Clear to ascultation bilaterally Abdomen: Soft, Nontender, Nondistended, +bowel sounds. Ext: Good pulses. LLE slight more swollen than right.  Lab Results: Basic Metabolic Panel:  Lab 11/27/11 2130 11/26/11 2011 11/25/11 0951  NA 139 139 139  K 3.6 3.7 4.0  CL 103 102 107  CO2 26 27 25   GLUCOSE 93 88 98  BUN 10 11 14   CREATININE 0.84 0.91 0.8  CALCIUM 9.2 9.4 9.2  MG -- -- --  PHOS -- -- --   Liver Function Tests:  Lab 11/27/11 0143  AST 17  ALT 25  ALKPHOS 56  BILITOT 0.3  PROT 7.1  ALBUMIN 3.5   No results found for this basename: LIPASE:5,AMYLASE:5 in the last 168 hours No results found for this basename: AMMONIA:5 in the last 168 hours CBC:  Lab 11/28/11 0500 11/27/11 0143 11/26/11 2011 11/25/11 0951  WBC 7.3 11.0* 10.0 8.9  NEUTROABS -- -- 5.5 5.5  HGB 13.4 13.9 13.8 14.1  HCT 40.2 40.1 41.1 42.8  MCV 86.5 84.6 85.1 87.9  PLT 277 270 270 278.0   Cardiac Enzymes:  Lab 11/27/11 0137  CKTOTAL 136  CKMB 1.9  CKMBINDEX --  TROPONINI <0.30   BNP (last 3 results)  Basename 11/25/11 0951  PROBNP 11.0   CBG: No results found for this basename: GLUCAP:5 in the last 168 hours No results found for this basename: HGBA1C:5 in the last 72 hours Other Labs: No components found with this basename: POCBNP:3  Lab 11/25/11 0951  DDIMER 6.53*   No results  found for this basename: CHOL:2,HDL:2,LDLCALC:2,TRIG:2,CHOLHDL:2,LDLDIRECT:2 in the last 168 hours No results found for this basename: TSH,T4TOTAL,FREET3,T3FREE,FREET4,THYROIDAB in the last 168 hours No results found for this basename: VITAMINB12:2,FOLATE:2,FERRITIN:2,TIBC:2,IRON:2,RETICCTPCT:2 in the last 168 hours  Micro Results: No results found for this or any previous visit (from the past 240 hour(s)).  Studies/Results: Ct Angio Chest W/cm &/or Wo Cm  11/26/2011  *RADIOLOGY REPORT*  Clinical Data: Left leg swelling.  Knee surgery and May 8.  Left lower extremity DVT.  Shortness of breath and chest pain.  CT ANGIOGRAPHY CHEST  Technique:  Multidetector CT imaging of the chest using the standard protocol during bolus administration of intravenous contrast. Multiplanar reconstructed images including MIPs were obtained and reviewed to evaluate the vascular anatomy.  Contrast: OMNIPAQUE IOHEXOL 350 MG/ML SOLN  Comparison: 03/16/2006  Findings: Technically adequate study with good opacification of the central and segmental pulmonary arteries.  Extensive filling defects demonstrated in the distal main pulmonary artery extending into both lobar pulmonary arteries and into multiple proximal segmental branches consistent with large saddle embolus.  No focal pulmonary consolidation or infarct is noted in the lungs.  Normal heart size.  Normal caliber thoracic aorta.  No significant lymphadenopathy in the chest.  Esophagus is decompressed.  No focal airspace consolidation or interstitial change in the lungs.  No pleural effusion.  No pneumothorax.  IMPRESSION: Large saddle type pulmonary embolus with emboli extending into multiple proximal segmental arteries bilaterally.  Critical Value/emergent results were called by telephone at the time of interpretation on 11/26/2011  at 20-815 hours  to  Dr. Mayford Knife, who verbally acknowledged these results.  Original Report Authenticated By: Marlon Pel, M.D.     Medications: I have reviewed the patient's current medications. Scheduled Meds:    . docusate sodium  100 mg Oral BID  . lisinopril  20 mg Oral QHS  . rivaroxaban  15 mg Oral BID   Followed by  . rivaroxaban  20 mg Oral Q breakfast  . senna  2 tablet Oral QHS  . sodium chloride  3 mL Intravenous Q12H  . verapamil  360 mg Oral Daily   Continuous Infusions:  PRN Meds:.acetaminophen, acetaminophen, HYDROcodone-acetaminophen, ondansetron (ZOFRAN) IV, ondansetron 2-D echocardiogram on 11/27/2013\ Left ventricle: The cavity size was normal. Wall thickness was normal. Systolic function was normal. The estimated ejection fraction was in the range of 55% to 60%. Wall motion was normal; there were no regional wall motion abnormalities.   Assessment/Plan: Large saddle type pulmonary embolus with emboli extending into multiple proximal segmental arteries bilaterally Started the patient on lovenox which was transitioned to Rivaroxaban on 11/28/2011. 2D-ECHO reviewed as indicated above. Wean off oxygen. No events on telemetry.  DVT from left distal calf to femoral vein, left common femoral vein is partially thrombosed On Rivaroxaban as indicated above.  Hypertension Stable. Continue lisinopril and verapamil.  Recent arthroscopic surgery for left knee meniscal tear Evaluated by physical therapy, recommended continuing outpatient physical therapy.  Disposition Discharge patient today.   LOS: 2 days  Ileanna Gemmill A, MD 11/28/2011, 3:07 PM

## 2011-11-28 NOTE — Progress Notes (Signed)
  Echocardiogram 2D Echocardiogram has been performed.  [name] 11/28/2011, 9:23 AM

## 2011-11-29 NOTE — ED Provider Notes (Signed)
Medical screening examination/treatment/procedure(s) were performed by non-physician practitioner and as supervising physician I was immediately available for consultation/collaboration.   Gwyneth Sprout, MD 11/29/11 2038

## 2011-12-01 ENCOUNTER — Ambulatory Visit (INDEPENDENT_AMBULATORY_CARE_PROVIDER_SITE_OTHER): Payer: BC Managed Care – PPO | Admitting: Internal Medicine

## 2011-12-01 ENCOUNTER — Encounter: Payer: Self-pay | Admitting: Internal Medicine

## 2011-12-01 VITALS — BP 126/72 | HR 88 | Temp 97.6°F | Wt 248.0 lb

## 2011-12-01 DIAGNOSIS — I2699 Other pulmonary embolism without acute cor pulmonale: Secondary | ICD-10-CM

## 2011-12-01 DIAGNOSIS — I82409 Acute embolism and thrombosis of unspecified deep veins of unspecified lower extremity: Secondary | ICD-10-CM

## 2011-12-01 NOTE — Assessment & Plan Note (Signed)
The pathophysiology of DVT & PTE;therapeutic options ; & the  additional follow up studies were all discussed.  Repeat venous Dopplers should be performed prior to stopping the Xarelto @ 6 months. This therapy should be considered in the future or with any immobilizing process such as international travel.

## 2011-12-01 NOTE — Progress Notes (Signed)
  Subjective:    Patient ID: [name], male    DOB: 1966-03-11, 46 y.o.   MRN: [id number]  HPI Status post deep venous thrombosis and saddle embolism in the setting of orthopedic surgery for 2 torn menisci and bursitis.Xarelto therapy initiated; this will be continued for a total of 6 months.  He has no past medical history of clotting disorder; there is no family history of clotting or bleeding disorders.    Review of Systems He continues to have intermittent chest pain which is unchanged from that he has had remotely for years. He also has some intermittent exertional dyspnea. He denies any hemoptysis.     Objective:   Physical Exam   He appears healthy and well-nourished; in no acute distress  Has a regular rhythm with no significant murmurs  Chest is clear with no increased work of breathing. He he has no abnormal breath sounds. Specifically he has no rubs  Homans sign is negative bilaterally  He is wearing support hose on the left leg. There is minimal edema of the upper plastic border  Pulses are intact        Assessment & Plan:

## 2011-12-01 NOTE — Patient Instructions (Addendum)
Share report with all caregivers

## 2011-12-05 ENCOUNTER — Ambulatory Visit: Payer: BC Managed Care – PPO | Admitting: Internal Medicine

## 2011-12-08 ENCOUNTER — Encounter (HOSPITAL_COMMUNITY): Payer: Self-pay | Admitting: Emergency Medicine

## 2011-12-08 ENCOUNTER — Emergency Department (HOSPITAL_COMMUNITY)
Admission: EM | Admit: 2011-12-08 | Discharge: 2011-12-09 | Disposition: A | Discharge status: Home / Self Care | Payer: BC Managed Care – PPO | Attending: Emergency Medicine | Admitting: Emergency Medicine

## 2011-12-08 ENCOUNTER — Emergency Department (HOSPITAL_COMMUNITY): Payer: BC Managed Care – PPO

## 2011-12-08 ENCOUNTER — Telehealth: Payer: Self-pay | Admitting: Internal Medicine

## 2011-12-08 DIAGNOSIS — R0602 Shortness of breath: Secondary | ICD-10-CM | POA: Insufficient documentation

## 2011-12-08 DIAGNOSIS — I10 Essential (primary) hypertension: Secondary | ICD-10-CM | POA: Insufficient documentation

## 2011-12-08 DIAGNOSIS — Z86718 Personal history of other venous thrombosis and embolism: Secondary | ICD-10-CM | POA: Insufficient documentation

## 2011-12-08 DIAGNOSIS — I82409 Acute embolism and thrombosis of unspecified deep veins of unspecified lower extremity: Secondary | ICD-10-CM | POA: Insufficient documentation

## 2011-12-08 DIAGNOSIS — R079 Chest pain, unspecified: Secondary | ICD-10-CM | POA: Insufficient documentation

## 2011-12-08 DIAGNOSIS — I2699 Other pulmonary embolism without acute cor pulmonale: Secondary | ICD-10-CM | POA: Insufficient documentation

## 2011-12-08 LAB — URINALYSIS, ROUTINE W REFLEX MICROSCOPIC
Glucose, UA: NEGATIVE mg/dL
Leukocytes, UA: NEGATIVE
pH: 7.5 (ref 5.0–8.0)

## 2011-12-08 LAB — CBC WITH DIFFERENTIAL/PLATELET
Eosinophils Relative: 6 % — ABNORMAL HIGH (ref 0–5)
HCT: 39.1 % (ref 39.0–52.0)
Hemoglobin: 13.3 g/dL (ref 13.0–17.0)
Lymphocytes Relative: 37 % (ref 12–46)
Lymphs Abs: 3.5 10*3/uL (ref 0.7–4.0)
MCV: 83.7 fL (ref 78.0–100.0)
Platelets: 250 10*3/uL (ref 150–400)
RBC: 4.67 MIL/uL (ref 4.22–5.81)
WBC: 9.4 10*3/uL (ref 4.0–10.5)

## 2011-12-08 LAB — BASIC METABOLIC PANEL
CO2: 22 mEq/L (ref 19–32)
Calcium: 9 mg/dL (ref 8.4–10.5)
Glucose, Bld: 101 mg/dL — ABNORMAL HIGH (ref 70–99)
Potassium: 3.5 mEq/L (ref 3.5–5.1)
Sodium: 138 mEq/L (ref 135–145)

## 2011-12-08 LAB — POCT I-STAT TROPONIN I

## 2011-12-08 MED ORDER — OXYCODONE-ACETAMINOPHEN 5-325 MG PO TABS
2.0000 | ORAL_TABLET | Freq: Once | ORAL | Status: AC
  Administered 2011-12-08: 2 via ORAL
  Filled 2011-12-08: qty 2

## 2011-12-08 MED ORDER — HYDROCODONE-ACETAMINOPHEN 5-500 MG PO TABS: 1.0000 | ORAL_TABLET | Freq: Four times a day (QID) | ORAL | Status: AC | PRN

## 2011-12-08 MED ORDER — ASPIRIN 81 MG PO CHEW: 324.0000 mg | CHEWABLE_TABLET | Freq: Once | ORAL | Status: DC

## 2011-12-08 NOTE — ED Provider Notes (Addendum)
History     CSN: [id number]  Arrival date & time 12/08/11  2101   First MD Initiated Contact with Patient 12/08/11 2117      Chief Complaint  Patient presents with  . Chest Pain    (Consider location/radiation/quality/duration/timing/severity/associated sxs/prior treatment) Patient is a 46 y.o. male presenting with chest pain. The history is provided by the patient. No language interpreter was used.  Chest Pain The chest pain began more than 2 weeks ago. Chest pain occurs constantly. The chest pain is worsening. At its most intense, the pain is at 9/10. The pain is currently at 7/10. The severity of the pain is moderate. The quality of the pain is described as aching and sharp. The pain does not radiate. Chest pain is worsened by deep breathing. Primary symptoms include shortness of breath and nausea. Pertinent negatives for primary symptoms include no fever, no fatigue, no syncope, no cough, no wheezing, no palpitations, no abdominal pain, no vomiting and no dizziness.  Pertinent negatives for associated symptoms include no near-syncope, no numbness and no weakness. He tried nothing for the symptoms. Risk factors include male gender.    46 yo male coming in with increased shortness of breath. States his he has bilateral DVTs and pulmonary embolus. Records show that he does have a saddle pulmonary embolus when he came in on 6/26 with bilateral dvts.   States that since then the shortness of breath has gotten worse. Patient appears to be hyperventilating presently. Respiratory rate is 40. Patient is on a for the PEs. Patient has nothing at home for pain. States that he doesn't know what he can take with the zorelto medication.    Past Medical History  Diagnosis Date  . Cluster headache   . HTN (hypertension)   . Rotator cuff arthropathy 2007    right  . DVT of lower extremity (deep venous thrombosis) 6/26-28/2013    Xarelto   . Saddle pulmonary embolus 6/26-28/2013    Past Surgical  History  Procedure Date  . Wisdom tooth extraction   . Cardiac catheterization 2007    Left ventricular dysfunction; normal coronaries  . Upper gastrointestinal endoscopy 2007  . Arthroscopic knee surgery 10/09/2011    Dr Magnus Ivan  . Rotator cuff repair 2007    right    Family History  Problem Relation Age of Onset  . Cancer Mother     Mesothelioma   . Diabetes Brother   . Hypertension Brother   . Stroke Brother   . Diabetes Maternal Aunt   . Diabetes Maternal Grandmother   . Heart disease Maternal Grandmother   . Hypertension Maternal Grandmother   . Stroke Maternal Grandmother   . Heart disease Maternal Grandfather   . Migraines Brother     History  Substance Use Topics  . Smoking status: Never Smoker   . Smokeless tobacco: Not on file  . Alcohol Use: No      Review of Systems  Constitutional: Negative.  Negative for fever and fatigue.  HENT: Negative.   Eyes: Negative.   Respiratory: Positive for shortness of breath. Negative for cough and wheezing.   Cardiovascular: Positive for chest pain. Negative for palpitations, leg swelling, syncope and near-syncope.  Gastrointestinal: Positive for nausea. Negative for vomiting and abdominal pain.  Neurological: Negative.  Negative for dizziness, weakness, light-headedness, numbness and headaches.  Psychiatric/Behavioral: Negative.   All other systems reviewed and are negative.    Allergies  Review of patient's allergies indicates no known allergies.  Home Medications  Current Outpatient Rx  Name Route Sig Dispense Refill  . LISINOPRIL 20 MG PO TABS Oral Take 20 mg by mouth at bedtime.    [name] RIVAROXABAN 15 MG PO TABS Oral Take 15 mg by mouth 2 (two) times daily.    [name] VERAPAMIL HCL 360 MG PO CP24 Oral Take 360 mg by mouth at bedtime.      BP 123/71  Pulse 78  Temp 97.6 F (36.4 C) (Oral)  Resp 20  SpO2 100%  Physical Exam  Nursing note and vitals reviewed. Constitutional: He is oriented to person, place,  and time. He appears well-developed and well-nourished.  HENT:  Head: Normocephalic.  Eyes: Conjunctivae and EOM are normal. Pupils are equal, round, and reactive to light.  Neck: Normal range of motion. Neck supple.  Cardiovascular: Normal rate.   Pulmonary/Chest: Effort normal.  Abdominal: Soft.  Musculoskeletal: Normal range of motion.  Neurological: He is alert and oriented to person, place, and time.  Skin: Skin is warm and dry.  Psychiatric: He has a normal mood and affect.    ED Course  Procedures (including critical care time)  Labs Reviewed  CBC WITH DIFFERENTIAL - Abnormal; Notable for the following:    Eosinophils Relative 6 (*)     All other components within normal limits  BASIC METABOLIC PANEL - Abnormal; Notable for the following:    Glucose, Bld 101 (*)     GFR calc non Af Amer 83 (*)     All other components within normal limits  POCT I-STAT TROPONIN I  PROTIME-INR  D-DIMER, QUANTITATIVE  URINALYSIS, ROUTINE W REFLEX MICROSCOPIC   Dg Chest 2 View  12/08/2011  *RADIOLOGY REPORT*  Clinical Data: Chest pain, shortness of breath, recent pulmonary emboli  CHEST - 2 VIEW  Comparison: 11/26/2011  Findings: Low volume exam with vascular and interstitial prominence.  Stable heart size.  No focal collapse, consolidation, edema, effusion or pneumothorax.  Trachea midline.  IMPRESSION: Low volume exam with vascular and interstitial prominence.  Original Report Authenticated By: Judie Petit. Ruel Favors, M.D.     No diagnosis found.    MDM  Previously diagnosed with DVTs bilateral and central pulmonary emboli. Patient came in hyperventilating and complaining of pain. Has taken none of his pain medication at home because he is afraid he'll get addicted to it. Encouraged to take his medicine. No pneumonia on x-ray. Labs unremarkable. Better after 2 Percocet in the ER. Patient and family agreed is ready for discharge. Prescription for Vicodin. Followup with Dr. Alwyn Ren tomorrow. Labs  Reviewed  PROTIME-INR - Abnormal; Notable for the following:    Prothrombin Time 19.6 (*)     INR 1.63 (*)     All other components within normal limits  CBC WITH DIFFERENTIAL - Abnormal; Notable for the following:    Eosinophils Relative 6 (*)     All other components within normal limits  BASIC METABOLIC PANEL - Abnormal; Notable for the following:    Glucose, Bld 101 (*)     GFR calc non Af Amer 83 (*)     All other components within normal limits  D-DIMER, QUANTITATIVE - Abnormal; Notable for the following:    D-Dimer, Quant 2.36 (*)     All other components within normal limits  URINALYSIS, ROUTINE W REFLEX MICROSCOPIC  POCT I-STAT TROPONIN I    Date: 01/01/2012  Rate:82  Rhythm: normal sinus rhythm  QRS Axis: normal  Intervals: normal  ST/T Wave abnormalities: normal  Conduction Disutrbances:none  Narrative Interpretation:  Old EKG Reviewed: unchanged        Remi Haggard, NP 12/08/11 2356  Remi Haggard, NP 01/01/12 1407

## 2011-12-08 NOTE — Telephone Encounter (Signed)
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

## 2011-12-08 NOTE — Telephone Encounter (Signed)
Patient informed/SLS  

## 2011-12-08 NOTE — ED Notes (Addendum)
PT. ARRIVED WITH EMS FROM HOME REPORTS GENERALIZED CHEST PAIN ONSET TODAY WITH SOB AND OCCASIONAL DRY COUGH , RECEIVED 4 BABY ASA AND 1 NTG SL PRIOR TO ARRIVAL WITH NO RELIEF , IV AT LEFT HAND 20G. BY EMS.  DENIES NAUSEA OR DIAPHORESIS . PT. ADDED THAT HE HAD A LEFT KNEE SURGERY LAST MAY AND DEVELOPED PULMONARY EMBOLISM AND LEG CLOTS LAST June AND WAS HOSPITALIZED.

## 2011-12-08 NOTE — Telephone Encounter (Signed)
needs to speak with someone regarding his visit a couple of weeks ago. Ph [phone]

## 2011-12-08 NOTE — Telephone Encounter (Signed)
Any  job restrictions would be related to the orthopedic surgery itself or restrictions imposed by physical therapy. The oral blood thinner prevents new clots from forming while the body dissolves the acute clots.

## 2011-12-11 ENCOUNTER — Encounter: Payer: Self-pay | Admitting: Internal Medicine

## 2011-12-11 ENCOUNTER — Ambulatory Visit (INDEPENDENT_AMBULATORY_CARE_PROVIDER_SITE_OTHER): Payer: BC Managed Care – PPO | Admitting: Internal Medicine

## 2011-12-11 ENCOUNTER — Encounter (HOSPITAL_COMMUNITY): Payer: Self-pay | Admitting: *Deleted

## 2011-12-11 ENCOUNTER — Emergency Department (HOSPITAL_COMMUNITY)
Admission: EM | Admit: 2011-12-11 | Discharge: 2011-12-11 | Disposition: A | Discharge status: Home / Self Care | Payer: BC Managed Care – PPO | Attending: Emergency Medicine | Admitting: Emergency Medicine

## 2011-12-11 ENCOUNTER — Emergency Department (HOSPITAL_COMMUNITY): Payer: BC Managed Care – PPO

## 2011-12-11 VITALS — BP 128/80 | HR 71 | Wt 247.2 lb

## 2011-12-11 DIAGNOSIS — Z86718 Personal history of other venous thrombosis and embolism: Secondary | ICD-10-CM | POA: Insufficient documentation

## 2011-12-11 DIAGNOSIS — S62619A Displaced fracture of proximal phalanx of unspecified finger, initial encounter for closed fracture: Secondary | ICD-10-CM

## 2011-12-11 DIAGNOSIS — R0609 Other forms of dyspnea: Secondary | ICD-10-CM

## 2011-12-11 DIAGNOSIS — Z86711 Personal history of pulmonary embolism: Secondary | ICD-10-CM | POA: Insufficient documentation

## 2011-12-11 DIAGNOSIS — R635 Abnormal weight gain: Secondary | ICD-10-CM

## 2011-12-11 DIAGNOSIS — Y9389 Activity, other specified: Secondary | ICD-10-CM | POA: Insufficient documentation

## 2011-12-11 DIAGNOSIS — R079 Chest pain, unspecified: Secondary | ICD-10-CM

## 2011-12-11 DIAGNOSIS — R0989 Other specified symptoms and signs involving the circulatory and respiratory systems: Secondary | ICD-10-CM

## 2011-12-11 DIAGNOSIS — I10 Essential (primary) hypertension: Secondary | ICD-10-CM | POA: Insufficient documentation

## 2011-12-11 DIAGNOSIS — W2209XA Striking against other stationary object, initial encounter: Secondary | ICD-10-CM | POA: Insufficient documentation

## 2011-12-11 DIAGNOSIS — IMO0002 Reserved for concepts with insufficient information to code with codable children: Secondary | ICD-10-CM | POA: Insufficient documentation

## 2011-12-11 DIAGNOSIS — Y998 Other external cause status: Secondary | ICD-10-CM | POA: Insufficient documentation

## 2011-12-11 DIAGNOSIS — R0600 Dyspnea, unspecified: Secondary | ICD-10-CM

## 2011-12-11 MED ORDER — OXYCODONE-ACETAMINOPHEN 5-325 MG PO TABS
2.0000 | ORAL_TABLET | Freq: Once | ORAL | Status: AC
  Administered 2011-12-11: 2 via ORAL
  Filled 2011-12-11: qty 2

## 2011-12-11 MED ORDER — IBUPROFEN 400 MG PO TABS
400.0000 mg | ORAL_TABLET | Freq: Once | ORAL | Status: DC
  Filled 2011-12-11: qty 1

## 2011-12-11 MED ORDER — OXYCODONE-ACETAMINOPHEN 5-325 MG PO TABS: 1.0000 | ORAL_TABLET | ORAL | Status: AC | PRN
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
## 2011-12-11 NOTE — ED Notes (Signed)
Ortho tech notified for arm splint

## 2011-12-11 NOTE — Progress Notes (Signed)
  Subjective:    Patient ID: [name], male    DOB: [birth year], 46 y.o.   MRN: [id number]  HPI He was seen in the emergency room 6/27 and 12/08/11 for chest pain shortness of breath. The EKG, chest x-ray, and labs were reviewed. Cardiac enzymes were negative x2. His d-dimer has dropped from 6.53-2.36. EKG did not show any signs of right ventricular strain.    Review of Systems  He has had no hemoptysis; he has had some nocturnal dyspnea. Edema in the left lower extremity is improving but still present to some degreea     Objective:   Physical Exam He appears healthy and well-nourished; he is in no acute distress  No carotid bruits are present. No NVD  Heart rhythm and rate are normal with no significant murmurs or gallops.S4  Chest is clear with no increased work of breathing  There is no evidence of aortic aneurysm or renal artery bruits  He has no clubbing or edema.   Pedal pulses are intact   No ischemic skin changes are present         Assessment & Plan:  #1 chest pain with negative cardiac enzymes and EKG  #2 dyspnea in the context of recent saddle embolus. D-dimer is improving and O2 sats are good  Plan: I would emphasize a gradually cardiovascular exercise program as the most important intervention. He should be guided by physical therapy the orthopedist as to what musculoskeletal limitations he has

## 2011-12-11 NOTE — ED Notes (Signed)
Pt returned from xray, ABC's intact

## 2011-12-11 NOTE — Patient Instructions (Addendum)
Eat a low-fat diet with lots of fruits and vegetables, up to 7-9 servings per day. Consume less than 40 grams (preferably ZERO) of sugar per day from foods & drinks with High Fructose Corn Syrup (HFCS) sugar as #1,2,3 or # 4 on label.Whole Foods, [employer] & [employer] do not carry products with HFCS. Follow a  low carb nutrition program such as [place] or [employer] Sugar [employer]usters  to prevent Diabetes progression . White carbohydrates (potatoes, rice, bread, and pasta) have a high spike of sugar and a high load of sugar. For example a  baked potato has a cup of sugar and a  french fry  2 teaspoons of sugar. Yams, wild  rice, whole grained bread &  wheat pasta have been much lower spike and load of  sugar. Portions should be the size of a deck of cards or your palm.

## 2011-12-11 NOTE — Progress Notes (Signed)
Orthopedic Tech Progress Note Patient Details:  [name] 08-Oct-1965 [id number]  Ortho Devices Type of Ortho Device: Volar splint Ortho Device/Splint Location: right hand Ortho Device/Splint Interventions: Application   Nikki Dom 12/11/2011, 9:16 PM

## 2011-12-11 NOTE — ED Provider Notes (Signed)
Medical screening examination/treatment/procedure(s) were performed by non-physician practitioner and as supervising physician I was immediately available for consultation/collaboration.  Martha K Linker, MD 12/11/11 0706 

## 2011-12-11 NOTE — ED Notes (Signed)
Patient transported to X-ray 

## 2012-01-01 NOTE — ED Provider Notes (Signed)
Medical screening examination/treatment/procedure(s) were performed by non-physician practitioner and as supervising physician I was immediately available for consultation/collaboration.  Ethelda Chick, MD 01/01/12 1500

## 2012-01-20 ENCOUNTER — Telehealth: Payer: Self-pay | Admitting: Internal Medicine

## 2012-01-20 MED ORDER — RIVAROXABAN 15 MG PO TABS: 15.0000 mg | ORAL_TABLET | Freq: Every day | ORAL | Status: DC

## 2012-01-20 NOTE — Telephone Encounter (Signed)
Dr.Hopper please advise, this medication has always been rx'ed by Dr.Reddy,Srikar

## 2012-01-20 NOTE — Telephone Encounter (Signed)
Refill: Xarelto 20mg  tab. Take one tablet by mouth every day. Qty 45.

## 2012-01-20 NOTE — Telephone Encounter (Signed)
RX sent

## 2012-01-20 NOTE — Telephone Encounter (Signed)
#  30 ; 1 qd . Refill through Dec; OV in early-mid  12/13.

## 2012-01-21 MED ORDER — RIVAROXABAN 20 MG PO TABS: 20.0000 mg | ORAL_TABLET | Freq: Every day | ORAL | Status: DC

## 2012-01-21 NOTE — Telephone Encounter (Signed)
I called the pharmacy and had them void rx for 15 mg and called in 20 mg tab.

## 2012-01-21 NOTE — Addendum Note (Signed)
Addended by: Maurice Small on: 01/21/2012 01:48 PM   Modules accepted: Orders

## 2012-01-21 NOTE — Telephone Encounter (Signed)
It is supposed to be for 20 mg daily until mid December

## 2012-01-21 NOTE — Telephone Encounter (Signed)
Pt called back stating that Rx was sent in for wrong strength. Pt indicated that he is currently on 20 mg not 15 mg.Please advise of strength clarification.

## 2012-01-21 NOTE — Telephone Encounter (Signed)
Dr.Hopper please advise for 15 mg is what was listed on patient's medication list.  Side Note: message printed off for quicker review

## 2012-04-20 ENCOUNTER — Ambulatory Visit (INDEPENDENT_AMBULATORY_CARE_PROVIDER_SITE_OTHER): Payer: BC Managed Care – PPO | Admitting: Internal Medicine

## 2012-04-20 ENCOUNTER — Encounter: Payer: Self-pay | Admitting: Internal Medicine

## 2012-04-20 VITALS — BP 126/88 | HR 70 | Wt 244.8 lb

## 2012-04-20 DIAGNOSIS — E785 Hyperlipidemia, unspecified: Secondary | ICD-10-CM

## 2012-04-20 DIAGNOSIS — I749 Embolism and thrombosis of unspecified artery: Secondary | ICD-10-CM

## 2012-04-20 DIAGNOSIS — I8290 Acute embolism and thrombosis of unspecified vein: Secondary | ICD-10-CM

## 2012-04-20 DIAGNOSIS — N529 Male erectile dysfunction, unspecified: Secondary | ICD-10-CM

## 2012-04-20 MED ORDER — SILDENAFIL CITRATE 100 MG PO TABS: 50.0000 mg | ORAL_TABLET | Freq: Every day | ORAL | Status: DC | PRN
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
## 2012-04-20 NOTE — Patient Instructions (Addendum)
Use warm moist compresses to 2-3 times a day to the left neck. Please perform isometric exercises before going to bed. Sit on side of the bed and raise up on toes to a count of 5. Then put pressure on the heels to a count of 5. Repeat this process 10 times. This will improve blood flow to the calves & help prevent cramps.

## 2012-04-23 NOTE — Addendum Note (Signed)
Addended by: Maurice Small on: 04/23/2012 04:04 PM   Modules accepted: Orders

## 2012-04-30 ENCOUNTER — Encounter (INDEPENDENT_AMBULATORY_CARE_PROVIDER_SITE_OTHER): Payer: BC Managed Care – PPO

## 2012-04-30 DIAGNOSIS — R229 Localized swelling, mass and lump, unspecified: Secondary | ICD-10-CM

## 2012-04-30 DIAGNOSIS — Z86718 Personal history of other venous thrombosis and embolism: Secondary | ICD-10-CM

## 2012-04-30 DIAGNOSIS — I8290 Acute embolism and thrombosis of unspecified vein: Secondary | ICD-10-CM

## 2012-05-11 ENCOUNTER — Telehealth: Payer: Self-pay | Admitting: Internal Medicine

## 2012-05-11 NOTE — Telephone Encounter (Signed)
Left message on VM for patient to return call when available  

## 2012-05-11 NOTE — Telephone Encounter (Signed)
Patient states he thought he had an appointment to follow up with Dr. Alwyn Ren on 12/16. There was no appointment made until I spoke with the patient today and scheduled him. He would like Chrae to call him back.

## 2012-05-12 NOTE — Telephone Encounter (Signed)
Patient returned your call. CB# [phone]

## 2012-05-12 NOTE — Telephone Encounter (Signed)
Depending on exam @ appt; a CT of the neck may be reconsidered. The fact that there is some reversibility is a good sign

## 2012-05-12 NOTE — Telephone Encounter (Signed)
Left message message on voicemail for patient to return call when available

## 2012-05-12 NOTE — Telephone Encounter (Signed)
Spoke with patient, patient states he was told to come in on the 16 th by a automated recording. I reviewed the system and informed patient that I do not see where he had a pending appointment for the 16th, only pending appointment is on the 17th that [place] recently scheduled. Patient aware we will further discuss placing orders for U/S to f/u on clots. Patient is almost at 6 month mark for taking blood thinner. Patient verbalized understanding.   Patient then asked if there is anything further to be done about raised area in neck, he was told it was fine but this area changes in size and is painful at times, Dr.Hopper please advise FYI: pending appointment 05/18/12

## 2012-05-13 NOTE — Telephone Encounter (Signed)
Per patient request I left message on voicemail with Dr.Hopper's response

## 2012-05-18 ENCOUNTER — Encounter: Payer: Self-pay | Admitting: Internal Medicine

## 2012-05-18 ENCOUNTER — Ambulatory Visit (INDEPENDENT_AMBULATORY_CARE_PROVIDER_SITE_OTHER): Payer: BC Managed Care – PPO | Admitting: Internal Medicine

## 2012-05-18 VITALS — BP 118/80 | HR 91 | Wt 243.3 lb

## 2012-05-18 DIAGNOSIS — N529 Male erectile dysfunction, unspecified: Secondary | ICD-10-CM

## 2012-05-18 DIAGNOSIS — I2699 Other pulmonary embolism without acute cor pulmonale: Secondary | ICD-10-CM

## 2012-05-18 NOTE — Progress Notes (Signed)
  Subjective:    Patient ID: [name], male    DOB: 02-Jul-1965, 46 y.o.   MRN: [id number]  HPI Dr Rayburn Ma apparently has diagnosed another meniscal tear as well as a probable popliteal cyst in the left lower extremity on Doppler in October. There's a question of him needing additional surgery.  The original plan was to discontinue  Xarelto at the end of this month as that would've been 6 months since his deep venous thrombosis/saddle embolus.   The vascular study of the neck revealed no clots in this area following the blunt trauma    Review of Systems  He denies chest pain, palpitations, dyspnea, paroxysmal nocturnal dyspnea, or calf pain/swelling. He had does have some discomfort in the left anterior shin area.    Objective:   Physical Exam He appears healthy and well-nourished; he is in no acute distress  No carotid bruits are present.  Heart rhythm and rate are normal with no significant murmurs or gallops.  Chest is clear with no increased work of breathing  There is no evidence of aortic aneurysm or renal artery bruits  He has no clubbing or edema.   Pedal pulses are intact   Homan's negative bilaterally  No ischemic skin changes are present         Assessment & Plan:

## 2012-05-18 NOTE — Assessment & Plan Note (Signed)
If additional orthopedic surgery is required; he should continue on anticoagulation. If no surgery is planned; Doppler could be scheduled at the end of this month and a blood thinner discontinued if no clots are present. He may be at potentially increased risk if he does have a popliteal cyst for recurrent deep venous thrombosis

## 2012-05-18 NOTE — Patient Instructions (Signed)
Review and correct the record as indicated. Please share record with all medical staff seen.  

## 2012-05-18 NOTE — Assessment & Plan Note (Addendum)
Cheaper therapeutically equivalent prescription medication options may be available from  [facility] at [phone] or  [facility] [place] [phone] (toll-free).   I believe testosterone supplementation would be contraindicated in view of the deep venous thrombosis and pulmonary emboli

## 2012-05-19 ENCOUNTER — Telehealth: Payer: Self-pay | Admitting: Internal Medicine

## 2012-05-19 NOTE — Telephone Encounter (Signed)
Hopp please advise  

## 2012-05-19 NOTE — Telephone Encounter (Signed)
Patient states he has blood clots in his lungs as well and would like an appointment for that. It was his understanding that we were sending him to an appointment for his leg and his lungs, but the appointment scheduled is for his leg only. Please advise.

## 2012-05-20 NOTE — Telephone Encounter (Signed)
Discussed with pt

## 2012-05-20 NOTE — Telephone Encounter (Signed)
The clots in the lungs originate in  the legs. The body dissolves the lung clots; as long as there are not new clots in the legs there  will not be recurrence of clots in the lung.

## 2012-05-28 ENCOUNTER — Encounter (INDEPENDENT_AMBULATORY_CARE_PROVIDER_SITE_OTHER): Payer: BC Managed Care – PPO

## 2012-05-28 DIAGNOSIS — I2699 Other pulmonary embolism without acute cor pulmonale: Secondary | ICD-10-CM

## 2012-05-28 DIAGNOSIS — R0609 Other forms of dyspnea: Secondary | ICD-10-CM

## 2012-05-28 DIAGNOSIS — R0989 Other specified symptoms and signs involving the circulatory and respiratory systems: Secondary | ICD-10-CM

## 2012-05-28 DIAGNOSIS — Z86718 Personal history of other venous thrombosis and embolism: Secondary | ICD-10-CM

## 2012-06-01 ENCOUNTER — Telehealth: Payer: Self-pay | Admitting: Internal Medicine

## 2012-06-01 MED ORDER — RIVAROXABAN 20 MG PO TABS: 20.0000 mg | ORAL_TABLET | Freq: Every day | ORAL | Status: DC

## 2012-06-01 NOTE — Telephone Encounter (Signed)
Hopp please advise if patient is to continue Xarelto

## 2012-06-01 NOTE — Telephone Encounter (Signed)
I called patient and he indicated that he had doppler on Friday. I informed patient I will f/u with vascular lab and call him back.  I called the Vascular lab and was told office closed for the day. I will try to call on Thursday (office closed tomorrow-New Years Day).   I called patient with status update, per Dr.Hopper send in rx for #30 of Xarelto, will contact patient with status update once report received.

## 2012-06-01 NOTE — Telephone Encounter (Signed)
Patient called to find out if he should still be taking Xarelto and if so, he will a refill.

## 2012-06-01 NOTE — Telephone Encounter (Signed)
No order has been entered for a repeat doppler.

## 2012-06-01 NOTE — Telephone Encounter (Signed)
Samples until Doppler repeated; schedule Doppler as soon as possible

## 2012-06-03 NOTE — Telephone Encounter (Signed)
Left message on voicemail for patient with status update

## 2012-06-03 NOTE — Telephone Encounter (Signed)
I called the vascular lab to request results, left message on VM for Missy in the vascular lab ( patient info left, along with fax number)

## 2012-06-04 NOTE — Telephone Encounter (Signed)
Spoke with patient, patient verbalized instruction.

## 2012-06-04 NOTE — Telephone Encounter (Signed)
Left message on voicemail for patient to call and confirmed that he received message

## 2012-06-04 NOTE — Telephone Encounter (Signed)
Vascular Lab report was received (sent for scanning), per Dr.Hopper's handwritten note: Fantastic!! STOP Xarelto but keep pills on hand. Patient instructed to call if questions or concerns.  I will call patient again later to verify message received. Copy of report mailed to patient

## 2012-06-15 ENCOUNTER — Telehealth: Payer: Self-pay | Admitting: Internal Medicine

## 2012-06-15 NOTE — Telephone Encounter (Signed)
Patient states he needs PA for Viagra. He states the pharmacy sent [place] this request.

## 2012-06-16 NOTE — Telephone Encounter (Signed)
Called pharmacy a requested PA info be faxed, Awaiting fax info to initiate PA.

## 2012-06-18 ENCOUNTER — Other Ambulatory Visit: Payer: Self-pay

## 2012-06-18 NOTE — Telephone Encounter (Signed)
Manual fax received from [facility] indicating PA needed for Viagra, [phone]. After holding for 19 min, I had to end call. I will try again later to initiate PA

## 2012-06-21 NOTE — Telephone Encounter (Signed)
I called to initiate PA, after 10 min call was disconnected. I called again to initiate PA, after 30 min of holding phone call ended. I will try again later

## 2012-06-22 NOTE — Telephone Encounter (Signed)
Generic form printed off line and filled out, placed on ledge for completion by MD then to be faxed to [phone]

## 2012-06-23 NOTE — Telephone Encounter (Signed)
See other telephone encounter.

## 2012-06-29 NOTE — Telephone Encounter (Signed)
PA approved 06/02/2012-07/02/2013, faxed to [facility], left message on VM informing patient med approved. Sent PA approval for scanning

## 2012-08-04 ENCOUNTER — Other Ambulatory Visit: Payer: Self-pay | Admitting: *Deleted

## 2012-08-04 ENCOUNTER — Telehealth: Payer: Self-pay | Admitting: Internal Medicine

## 2012-08-04 DIAGNOSIS — N529 Male erectile dysfunction, unspecified: Secondary | ICD-10-CM

## 2012-08-04 MED ORDER — SILDENAFIL CITRATE 100 MG PO TABS: 50.0000 mg | ORAL_TABLET | Freq: Every day | ORAL | Status: DC | PRN

## 2012-08-04 NOTE — Telephone Encounter (Signed)
Refill for Viagra called to [facility] pharmacy [facility] with 2 rf.

## 2012-08-04 NOTE — Telephone Encounter (Signed)
Patient states we need to call [phone] to authorize viagra #8.

## 2012-08-10 NOTE — Telephone Encounter (Signed)
Rx last filled for 3 tabs 07-01-11 per insurance no PA need PA already on file. Spoke to pharmacy who states that med is not being denied med is processing for 8 tab at $100 that is with his insurance coverage. Left message to call office to advise Pt.

## 2012-08-11 NOTE — Telephone Encounter (Signed)
Discuss with patient  

## 2012-10-15 ENCOUNTER — Telehealth: Payer: Self-pay | Admitting: Internal Medicine

## 2012-10-15 NOTE — Telephone Encounter (Signed)
Pt advised that he has not gone back to see Dr. Rayburn Ma regarding issues with Left Knee. Pt advised that the right knee is now giving him problems. Please advise.

## 2012-10-15 NOTE — Telephone Encounter (Signed)
Patient called to request pain medication for his knee pain. He states he had knee surgery several years ago and has pain from time to time. Patient is requesting oxycodone or hydrocodone. Pt uses [facility] [place]. CB# [phone]

## 2012-10-15 NOTE — Telephone Encounter (Signed)
Received message, tried to call pt back to get more information. Per Dr. Frederik Pear Last OV note on 05/2012, Dr. Rayburn Ma had diagnosed pt with a torn Meniscus and some other injuries. Need to know if he has been treated for any of these injuries.

## 2012-10-15 NOTE — Telephone Encounter (Signed)
Left a detailed message for the pt informing him of Hopp's advice and the need to consult with Ortho for both knees. Cannot send in a prescription at this time.

## 2012-10-15 NOTE — Telephone Encounter (Signed)
If the pain is severe enough to employ narcotic medication; he should be reevaluated by the orthopedist. If the condition is not addressed it could lead to permanent impairment or injury.

## 2012-12-13 ENCOUNTER — Encounter: Payer: Self-pay | Admitting: Physical Medicine & Rehabilitation

## 2013-01-11 ENCOUNTER — Ambulatory Visit (INDEPENDENT_AMBULATORY_CARE_PROVIDER_SITE_OTHER): Payer: BC Managed Care – PPO | Admitting: Internal Medicine

## 2013-01-11 ENCOUNTER — Encounter: Payer: Self-pay | Admitting: Internal Medicine

## 2013-01-11 VITALS — BP 132/80 | HR 85 | Temp 98.0°F | Wt 248.0 lb

## 2013-01-11 DIAGNOSIS — R600 Localized edema: Secondary | ICD-10-CM

## 2013-01-11 DIAGNOSIS — R609 Edema, unspecified: Secondary | ICD-10-CM

## 2013-01-11 DIAGNOSIS — Z86718 Personal history of other venous thrombosis and embolism: Secondary | ICD-10-CM

## 2013-01-11 LAB — D-DIMER, QUANTITATIVE: D-Dimer, Quant: 0.33 ug/mL-FEU (ref 0.00–0.48)

## 2013-01-11 MED ORDER — RIVAROXABAN 15 MG PO TABS: 15.0000 mg | ORAL_TABLET | Freq: Two times a day (BID) | ORAL | Status: DC

## 2013-01-11 NOTE — Patient Instructions (Addendum)
Activate the  My Chart system; lab  results will be released directly  to you as soon as I review & address these through the computer. Additionally you can use this system to gain direct  access to your records  if  out of town or @ an office of a  physician who is not in  the My Chart network.  This improves continuity of care & places you in control of your medical record.

## 2013-01-11 NOTE — Progress Notes (Signed)
  Subjective:    Patient ID: [name], male    DOB: 1965-08-04, 47 y.o.   MRN: [id number]  HPI  He's had variable severity of painless swelling in the left lower extremity below the knee for the last 2 months. About that time he began to wear a neoprene knee wrap bilaterally because of joint pain.  He's had some sweats without fever or chills.  He has intermittent shortness of breath both at rest or with exertion during the day over past 6 weeks.  Significant history includes 6 month course of Xarelto  June-December 2013 for deep venous thrombosis and pulmonary thromboembolism.    Review of Systems  He denies chest pain or hemoptysis.  He is trying to avoid additional knee surgery on the left. He has been referred to a chronic pain Center.     Objective:   Physical Exam General appearance is one of good health and nourishment w/o distress.  Eyes: No conjunctival inflammation or scleral icterus is present.  Neck: no NVD @ 15 degrees   Heart:  Normal rate and regular rhythm. S1 and S2 normal without gallop, murmur, click, rub or other extra sounds     Lungs:Chest clear to auscultation; no wheezes, rhonchi,rales ,or rubs present.No increased work of breathing.   Abdomen: bowel sounds normal, soft and non-tender without masses, organomegaly or hernias noted.  No guarding or rebound . No hepatojugular reflux  Musculoskeletal: Minimal crepitus left greater than right knee. No patellar effusion present. He  Homans sign negative bilaterally  Vascular: Carotid, radial, and pedal pulses intact without bruits.  Skin:Warm & dry.  Intact without suspicious lesions or rashes  Lymphatic: No lymphadenopathy is noted about the head, neck, axilla.            Assessment & Plan:  #1 edema left lower extremity in the context of prior deep venous thrombosis and pulmonary embolus  #2 knee pain for which he wears a neoprene brace. This may be acting somewhat like a tourniquet  compromising venous return in the left lower extremity.  Plan: D-dimer. Samples of Xarelto will be provided pending that result. If d-dimer is elevated; venous Doppler will be for performed

## 2013-01-14 ENCOUNTER — Encounter: Payer: Self-pay | Admitting: Physical Medicine & Rehabilitation

## 2013-01-14 ENCOUNTER — Encounter
Payer: BC Managed Care – PPO | Attending: Physical Medicine & Rehabilitation | Admitting: Physical Medicine & Rehabilitation

## 2013-01-14 VITALS — BP 130/83 | HR 78 | Resp 14 | Ht 67.0 in | Wt 243.6 lb

## 2013-01-14 DIAGNOSIS — I10 Essential (primary) hypertension: Secondary | ICD-10-CM | POA: Insufficient documentation

## 2013-01-14 DIAGNOSIS — M25561 Pain in right knee: Secondary | ICD-10-CM

## 2013-01-14 DIAGNOSIS — Z86711 Personal history of pulmonary embolism: Secondary | ICD-10-CM | POA: Insufficient documentation

## 2013-01-14 DIAGNOSIS — IMO0002 Reserved for concepts with insufficient information to code with codable children: Secondary | ICD-10-CM

## 2013-01-14 DIAGNOSIS — I809 Phlebitis and thrombophlebitis of unspecified site: Secondary | ICD-10-CM

## 2013-01-14 DIAGNOSIS — Z79899 Other long term (current) drug therapy: Secondary | ICD-10-CM

## 2013-01-14 DIAGNOSIS — M1710 Unilateral primary osteoarthritis, unspecified knee: Secondary | ICD-10-CM | POA: Insufficient documentation

## 2013-01-14 DIAGNOSIS — Z86718 Personal history of other venous thrombosis and embolism: Secondary | ICD-10-CM | POA: Insufficient documentation

## 2013-01-14 DIAGNOSIS — Z5181 Encounter for therapeutic drug level monitoring: Secondary | ICD-10-CM

## 2013-01-14 DIAGNOSIS — M25569 Pain in unspecified knee: Secondary | ICD-10-CM | POA: Insufficient documentation

## 2013-01-14 DIAGNOSIS — M1711 Unilateral primary osteoarthritis, right knee: Secondary | ICD-10-CM | POA: Insufficient documentation

## 2013-01-14 DIAGNOSIS — M1712 Unilateral primary osteoarthritis, left knee: Secondary | ICD-10-CM

## 2013-01-14 MED ORDER — MELOXICAM 15 MG PO TABS: 15.0000 mg | ORAL_TABLET | Freq: Every day | ORAL | Status: DC

## 2013-01-14 NOTE — Progress Notes (Signed)
Subjective:    Patient ID: [name], male    DOB: [birth year], 47 y.o.   MRN: [id number]  HPI  This is a pleasant 47 male here for an initial evaluation referred to me by Dr. Doneen Poisson, who has had chronic knee pain, left more than right for the last 6 years. He has had substantial OA of both knees with meniscal disease specifically and pre-patellar bursitis. In May of last year he had a partial medial menisectomy and bursectomy on the left by Dr. Magnus Ivan. He suffered a LLE DVT and PE after the surgery. He was on Xarelto until January of this year. He has developed increased swelling over the last 2 months. He saw Dr. Alwyn Ren this week and D-Dimer was negative. Dr. Alwyn Ren asked to stop wearing the knee sleeve as it may be increasing the distal edema and asked him to purchase compression hose.   Dr. Magnus Ivan has discussed TKA on the left side but the patient is not anxious to pursue for various reasons. He has had knee injections on several occasions including after the surgery, which only tended to last a couple weeks.   He wears neoprene sleeves over both knees for support. Otherwise for pain control he uses hydrocodone two tabs, twice daily on average which Dr. Magnus Ivan has been prescribing. He doesn't routinely use ice. He has used some topical gels, OTC. He hasn't been on an NSAID because he's been on xarelto.     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  He is on his feet continuously, 10 hours per day usually 5-6 days per week.  He rarely sits, but tries to sit on a stool when he can. His pain limits him from leisure activities at home. He can walk 100-200 fit without having to stop.   Pain Inventory Average Pain 9 Pain Right Now 9 My pain is sharp, burning, stabbing and aching  In the last 24 hours, has pain interfered with the following? General activity 5 Relation with others 5 Enjoyment of life 5 What TIME of  day is your pain at its worst? morning daytime and evening Sleep (in general) Fair  Pain is worse with: walking, bending, standing and some activites Pain improves with: medication Relief from Meds: 5  Mobility walk without assistance ability to climb steps?  yes do you drive?  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  no  Physicians involved in your care Orthopedist blackmon   Family History  Problem Relation Age of Onset  . Cancer Mother     Mesothelioma   . Diabetes Brother   . Hypertension Brother   . Stroke Brother   . Diabetes Maternal Aunt   . Diabetes Maternal Grandmother   . Heart disease Maternal Grandmother   . Hypertension Maternal Grandmother   . Stroke Maternal Grandmother   . Heart disease Maternal Grandfather     > 55  . Migraines Brother   . Deep vein thrombosis Maternal Grandmother    History   Social History  . Marital Status: Single    Spouse Name: N/A    Number of Children: N/A  . Years of Education: N/A   Social History Main Topics  . Smoking status: Never Smoker   . Smokeless tobacco: Never Used  . Alcohol Use: No  .  Drug Use: No  . Sexual Activity: None   Other Topics Concern  . None   Social History Narrative  . None   Past Surgical History  Procedure Laterality Date  . Wisdom tooth extraction    . Cardiac catheterization  2007    Left ventricular dysfunction; normal coronaries  . Upper gastrointestinal endoscopy  2007  . Arthroscopic knee surgery  10/09/2011    Dr Magnus Ivan  . Rotator cuff repair  2007    right   Past Medical History  Diagnosis Date  . Cluster headache   . HTN (hypertension)   . Rotator cuff arthropathy 2007    right  . DVT of lower extremity (deep venous thrombosis) 6/26-28/2013    Xarelto   . Saddle pulmonary embolus 6/26-28/2013   BP 130/83  Pulse 78  Resp 14  Ht 5\' 7"   (1.702 m)  Wt 243 lb 9.6 oz (110.496 kg)  BMI 38.14 kg/m2  SpO2 92%     Review of Systems  Respiratory: Positive for shortness of breath.   All other systems reviewed and are negative.       Objective:   Physical Exam  General: Alert and oriented x 3, No apparent distress. Slightly overweight HEENT: Head is normocephalic, atraumatic, PERRLA, EOMI, sclera anicteric, oral mucosa pink and moist, dentition intact, ext ear canals clear,  Neck: Supple without JVD or lymphadenopathy Heart: Reg rate and rhythm. No murmurs rubs or gallops Chest: CTA bilaterally without wheezes, rales, or rhonchi; no distress Abdomen: Soft, non-tender, non-distended, bowel sounds positive. Extremities: No clubbing, cyanosis, or edema. Pulses are 2+ Skin: Clean and intact without signs of breakdown Neuro: Pt is cognitively appropriate with normal insight, memory, and awareness. Cranial nerves 2-12 are intact. Sensory exam is normal. Reflexes are 2+ in all 4's. Fine motor coordination is intact. No tremors. Motor function is grossly 5/5.  Musculoskeletal: mild medial left knee effusion. Pain with bilateral meniscal maneuvers on the left, lesser so on the right. Mild crepitus on the left. No instability. Walks with antalgia on the left more so than right, moreso than right.  Psych: Pt's affect is appropriate. Pt is cooperative         Assessment & Plan:  1. Bilateral OA of the knees,  Left more than right  -pt s/p arthroscopy May 2013 with medial meniscectomy and bursectomy 2. Hx of LLE DVT and PE last year, possibly with mild thrombophlebitis related to this hx   Plan: 1. Will send pt ot ADvanced orthotics for neoprene leg sleeve to extend from the ankle to over the knee to give both edema control and support to the knee 2. Trial of meloxicam 15mg  qd for OA pain. (he's no longer on xarelto). May help any phlebitic symptoms too. Look out for increased edema 3.Discusse weight control and strenghtening  exercises for quads, hamstrings, gastrocs 4. PRN hydrocodone for breakthrough pain. I can fill this for the patient once we receive a clean UDS 5. Follow up with me in about 6 weeks. 30 minutes of face to face patient care time were spent during this visit. All questions were encouraged and answered.   I would like to thank Dr. Magnus Ivan for allowing me to participate in the care of this pleasant man.

## 2013-01-14 NOTE — Patient Instructions (Signed)
CALL ME WITH ANY PROBLEMS OR QUESTIONS (#[phone]).  HAVE A GOOD DAY  

## 2013-04-07 ENCOUNTER — Other Ambulatory Visit: Payer: Self-pay

## 2013-06-09 ENCOUNTER — Telehealth: Payer: Self-pay

## 2013-06-09 NOTE — Telephone Encounter (Signed)
Left message for call back Non identifiable  Tdap--06/2011

## 2013-06-10 ENCOUNTER — Encounter: Payer: Self-pay | Admitting: Internal Medicine

## 2013-06-10 ENCOUNTER — Ambulatory Visit (INDEPENDENT_AMBULATORY_CARE_PROVIDER_SITE_OTHER): Payer: BC Managed Care – PPO | Admitting: Internal Medicine

## 2013-06-10 VITALS — BP 138/84 | HR 82 | Temp 98.5°F | Resp 12 | Ht 67.75 in | Wt 255.4 lb

## 2013-06-10 DIAGNOSIS — E785 Hyperlipidemia, unspecified: Secondary | ICD-10-CM

## 2013-06-10 DIAGNOSIS — I2699 Other pulmonary embolism without acute cor pulmonale: Secondary | ICD-10-CM

## 2013-06-10 DIAGNOSIS — I809 Phlebitis and thrombophlebitis of unspecified site: Secondary | ICD-10-CM

## 2013-06-10 MED ORDER — SILDENAFIL CITRATE 20 MG PO TABS: ORAL_TABLET | ORAL | Status: DC

## 2013-06-10 NOTE — Patient Instructions (Addendum)
Minimal Blood Pressure Goal= AVERAGE < 140/90;  Ideal is an AVERAGE < 135/85. This AVERAGE should be calculated from @ least 5-7 BP readings taken @ different times of day on different days of week. You should not respond to isolated BP readings , but rather the AVERAGE for that week .Please bring your  blood pressure cuff to office visits to verify that it is reliable.It  can also be checked against the blood pressure device at the pharmacy. Finger or wrist cuffs are not dependable; an arm cuff is.  Plain Mucinex (NOT D) for thick secretions ;force NON dairy fluids .   Nasal cleansing in the shower as discussed with lather of mild shampoo.After 10 seconds wash off lather while  exhaling through nostrils. Make sure that all residual soap is removed to prevent irritation.  Nasacort AQ OTC 1 spray in each nostril twice a day as needed. Use the "crossover" technique into opposite nostril spraying toward opposite ear @ 45 degree angle, not straight up into nostril.  Use a Neti pot daily only  as needed for significant sinus congestion; going from open side to congested side . Plain Allegra (NOT D )  160 daily , Loratidine 10 mg , OR Zyrtec 10 mg @ bedtime  as needed for itchy eyes & sneezing.    Alternatively cheaper therapeutically equivalent option for Viagra is available from  [facility] at [phone].

## 2013-06-10 NOTE — Progress Notes (Signed)
Pre visit review using our clinic review tool, if applicable. No additional management support is needed unless otherwise documented below in the visit note. 

## 2013-06-10 NOTE — Progress Notes (Signed)
Subjective:    Patient ID: [name], male    DOB: May 21, 1966, 48 y.o.   MRN: [id number]  HPI  He is here for a physical;acute issues denied     Review of Systems  A heart healthy diet is followed; exercise encompasses 60-90 minutes 3  times per week as treadmill, bike, & weights without symptoms.  Family history is positive for premature CVA. Advanced cholesterol testing reveals  LDL goal is less than 120 ; ideally < 90 . To date no statin.  Low dose ASA not  taken Specifically denied are  chest pain, palpitations, dyspnea, or claudication.  BP 130/85 on average.  Lab studies were performed his place of employment 03/10/13. His LDL was minimally elevated at 105; TG 75; HDL 43; and glucose 90. All at goal. Snoring described by significant other; no apnea in content of chronic  nasal congestion.      Objective:   Physical Exam  Gen.: Healthy and well-nourished in appearance. Alert, appropriate and cooperative throughout exam.Appears younger than stated age  Head: Normocephalic without obvious abnormalities; head shaven. Beard & moustache Eyes: No corneal or conjunctival inflammation noted. Pupils equal round reactive to light and accommodation. Extraocular motion intact. Fundal exam is benign without hemorrhages, exudate, papilledema.  Vision grossly normal with lenses Ears: External  ear exam reveals no significant lesions or deformities. Canals clear .TMs normal. Hearing is grossly normal bilaterally. Nose: External nasal exam reveals no deformity or inflammation. Nasal mucosa are pink and moist. No lesions or exudates noted.   Mouth: Oral mucosa and oropharynx reveal no lesions or exudates but crowded. Teeth in good repair. Neck: No deformities, masses, or tenderness noted. Range of motion & Thyroid normal. Lungs: Normal respiratory effort; chest expands symmetrically. Lungs are clear to auscultation without rales, wheezes, or increased work of breathing. Heart: Normal rate  and rhythm. Normal S1 and S2. No gallop, click, or rub. S4 w/o murmur. Abdomen: Bowel sounds normal; abdomen soft and nontender. No masses, organomegaly or hernias noted. Genitalia: Genitalia normal except for left varices. Prostate is normal without enlargement, asymmetry, nodularity, or induration                                Musculoskeletal/extremities: No deformity or scoliosis noted of  the thoracic or lumbar spine.  No clubbing, cyanosis, edema, or significant extremity  deformity noted. Range of motion normal .Tone & strength normal. Hand joints normal . Fingernail  health good. Able to lie down & sit up w/o help. Negative SLR bilaterally Vascular: Carotid, radial artery, dorsalis pedis and  posterior tibial pulses are full and equal. No bruits present. Neurologic: Alert and oriented x3. Deep tendon reflexes symmetrical and normal.         Skin: Intact without suspicious lesions or rashes. Lymph: No cervical, axillary, or inguinal lymphadenopathy present. Psych: Mood and affect are normal. Normally interactive                                                                                        Assessment & Plan:  #1 comprehensive physical  exam; no acute findings  Plan: see Orders  & Recommendations

## 2013-06-14 NOTE — Telephone Encounter (Signed)
Unable to reach pre visit.  

## 2013-06-15 ENCOUNTER — Telehealth: Payer: Self-pay | Admitting: *Deleted

## 2013-06-15 NOTE — Telephone Encounter (Signed)
Patient called and wanted to see if dr hopper could call his sildenafil (REVATIO) 20 MG tablet to [facility] drug pharmacy in [place].

## 2013-06-16 NOTE — Telephone Encounter (Signed)
Script was printed during [name] with Dr. Linna Darner on 06/10/13. Called and left message for patient to verify that he received script during his visit. JG//CMA

## 2013-06-17 ENCOUNTER — Other Ambulatory Visit: Payer: Self-pay | Admitting: *Deleted

## 2013-07-12 ENCOUNTER — Ambulatory Visit: Payer: BC Managed Care – PPO | Admitting: Internal Medicine

## 2013-08-12 ENCOUNTER — Encounter: Payer: Self-pay | Admitting: Internal Medicine

## 2013-08-12 ENCOUNTER — Other Ambulatory Visit (INDEPENDENT_AMBULATORY_CARE_PROVIDER_SITE_OTHER): Payer: BC Managed Care – PPO

## 2013-08-12 ENCOUNTER — Ambulatory Visit (INDEPENDENT_AMBULATORY_CARE_PROVIDER_SITE_OTHER): Payer: BC Managed Care – PPO | Admitting: Internal Medicine

## 2013-08-12 VITALS — BP 140/72 | HR 96 | Temp 97.3°F | Resp 15 | Wt 257.8 lb

## 2013-08-12 DIAGNOSIS — Z9189 Other specified personal risk factors, not elsewhere classified: Secondary | ICD-10-CM

## 2013-08-12 DIAGNOSIS — Z87898 Personal history of other specified conditions: Secondary | ICD-10-CM

## 2013-08-12 DIAGNOSIS — R635 Abnormal weight gain: Secondary | ICD-10-CM

## 2013-08-12 DIAGNOSIS — M79609 Pain in unspecified limb: Secondary | ICD-10-CM

## 2013-08-12 DIAGNOSIS — M79662 Pain in left lower leg: Secondary | ICD-10-CM

## 2013-08-12 DIAGNOSIS — Z86711 Personal history of pulmonary embolism: Secondary | ICD-10-CM

## 2013-08-12 LAB — TSH: TSH: 1.32 u[IU]/mL (ref 0.35–5.50)

## 2013-08-12 MED ORDER — VARDENAFIL HCL 20 MG PO TABS: 20.0000 mg | ORAL_TABLET | Freq: Every day | ORAL | Status: DC | PRN

## 2013-08-12 MED ORDER — RIVAROXABAN 15 MG PO TABS: 15.0000 mg | ORAL_TABLET | Freq: Two times a day (BID) | ORAL | Status: DC

## 2013-08-12 NOTE — Progress Notes (Signed)
Pre visit review using our clinic review tool, if applicable. No additional management support is needed unless otherwise documented below in the visit note. 

## 2013-08-12 NOTE — Progress Notes (Signed)
   Subjective:    Patient ID: [name], male    DOB: February 02, 1966, 48 y.o.   MRN: [id number]  HPI He describes swelling and discomfort in the left lower extremity over the last month which is progressive. It is associated with cramps and pain on exertion up to level VIII-9 on 10 scale. He did have arthroscopy on the knee last year. He has not tried ice or heat for the pain.  He's experienced shortness of breath on exertion intermittently. He describes fatigue and occasional night sweats, typically once a week over the last 6 weeks.  He denies chest pain. Hemoptysis is not present  His weight is up 15 pounds in the last 6 months  He had deep venous thrombosis and saddle embolus following orthopedic surgery for 2 torn menisci. He was hospitalized 6/26-28/13 and placed on all oral anticoagulant. This was taken for a total of 6 months through 12/13.  There is no personal or family history of coagulopathy.MGM may have had DVT      Review of Systems  He describes significant snoring and actually wakes himself up. He denies any gasping or paroxysmal nocturnal dyspnea. No apnea reported. He denies daytime somnolence. He's had no motor vehicle accidents related to falling asleep while driving.  He doesn't awaken 2-3 times per night to urinate.     Objective:   Physical Exam General appearance is one of good health and nourishment w/o distress.  Eyes: No conjunctival inflammation or scleral icterus is present.  Thyroid is normal to palpation without enlargement   Heart:  Normal rate and regular rhythm. S1 and S2 normal without gallop, murmur, click, rub or other extra sounds     Lungs:Chest clear to auscultation; no wheezes, rhonchi,rales ,or rubs present.No increased work of breathing.   Abdomen: bowel sounds normal, soft and non-tender without masses, organomegaly or hernias noted.  No guarding or rebound . No tenderness over the flanks to percussion  Musculoskeletal: Able to  lie flat and sit up without help. Negative Homans' bilaterally. Gastrocnemius circumference 42.5 cm bilaterally. Minor crepitus of the knees without associated effusion  Skin:Warm & dry.  Intact without suspicious lesions or rashes   Lymphatic: No lymphadenopathy is noted about the head, neck, axilla  Deep tendon reflexes are equal. Strength and tone are normal             Assessment & Plan:  #1 left lower extremity pain worse with walking. This is in the context of previous deep venous thrombosis/saddle embolus  #2 weight gain  #3 excessive snoring  Plan: D-dimer will be collected. This is elevated then venous Doppler would be pursued.  Thyroid functions will be checked.  Once deep venous thrombosis is ruled out; referral will be made for sleep apnea evaluation.

## 2013-08-12 NOTE — Patient Instructions (Signed)
Your next office appointment will be determined based upon review of your pending labs. Those instructions will be transmitted to you through My Chart . 

## 2013-08-12 NOTE — Addendum Note (Signed)
Addended by: Harl Bowie on: 08/12/2013 04:13 PM   Modules accepted: Orders

## 2013-08-13 ENCOUNTER — Other Ambulatory Visit: Payer: Self-pay | Admitting: Internal Medicine

## 2013-08-13 DIAGNOSIS — G4733 Obstructive sleep apnea (adult) (pediatric): Secondary | ICD-10-CM | POA: Insufficient documentation

## 2013-08-13 DIAGNOSIS — Z87898 Personal history of other specified conditions: Secondary | ICD-10-CM

## 2013-08-13 LAB — D-DIMER, QUANTITATIVE (NOT AT ARMC): D DIMER QUANT: 0.31 ug{FEU}/mL (ref 0.00–0.48)

## 2013-08-17 ENCOUNTER — Telehealth: Payer: Self-pay | Admitting: *Deleted

## 2013-08-17 NOTE — Telephone Encounter (Signed)
LMOM (9:58am) asking the pt to RTC regarding recent lab results.//AB/CMA

## 2013-08-17 NOTE — Telephone Encounter (Signed)
Spoke with the pt and informed him of Dr. Clayborn Heron note below.  Pt understood and agreed.  Pt stated that he has been having cramping feeling in his (R) leg.  It last 3-5 mins and he feels it when he stands or walking.  It don't happen everyday but every other day.  Please advise.//AB/CMA

## 2013-08-17 NOTE — Telephone Encounter (Signed)
Message copied by Harl Bowie on Wed Aug 17, 2013  9:56 AM ------      Message from: Hendricks Limes      Created: Wed Aug 17, 2013  8:50 AM       The clotting studies so far negative. Not all tests have been completed. For protection; please take 2 coated baby aspirin daily(total dose of 162 mg/81 mg each) after breakfast.            Again we would do the Doppler study of the legs if there is continued pain and swelling even though the screening test for clots (d-dimer) was normal. ------

## 2013-08-23 LAB — COAGULATION PANEL
Anticardiolipin IgA: <9 APL U/mL (ref 0–11)
Anticardiolipin IgG: <9 GPL U/mL (ref 0–14)
Anticardiolipin IgM: <9 MPL U/mL (ref 0–12)
HOMOCYSTEINE: 8.4 umol/L (ref 0.0–15.0)
PROTEIN S ACTIVITY: 90 % (ref 60–145)
PTT Lupus Anticoagulant: 30.8 s (ref 0.0–50.0)
Protein C Activity: 131 % (ref 74–151)
Protein S Ag, Free: 78 % (ref 56–124)
THROMBIN TIME: 17.2 s (ref 0.0–20.0)

## 2013-08-24 ENCOUNTER — Other Ambulatory Visit: Payer: Self-pay | Admitting: Internal Medicine

## 2013-08-24 DIAGNOSIS — Z86711 Personal history of pulmonary embolism: Secondary | ICD-10-CM

## 2013-08-24 DIAGNOSIS — D6851 Activated protein C resistance: Secondary | ICD-10-CM

## 2013-08-24 DIAGNOSIS — M79669 Pain in unspecified lower leg: Secondary | ICD-10-CM

## 2013-08-24 NOTE — Telephone Encounter (Signed)
Spoke with the pt and informed him per Dr. Linna Darner he would like for him to go for the doppler study on (B) leg,and he stated that he has already received a call with the appt time.  Also informed him that Dr. Linna Darner would like for him to restart the Xarelto 15mg  bid.  Pt understood and agreed.  He asked if this was bad and I informed him he wants to may sure he does not have a blood clots.  Pt was not happy about going back on the Xarelto but he said he would.  Asked the pt if he still had the samples and he stated he did.//AB/CMA

## 2013-08-25 ENCOUNTER — Ambulatory Visit (HOSPITAL_COMMUNITY): Payer: BC Managed Care – PPO | Attending: Internal Medicine | Admitting: Cardiology

## 2013-08-25 ENCOUNTER — Telehealth: Payer: Self-pay | Admitting: Hematology and Oncology

## 2013-08-25 DIAGNOSIS — M79609 Pain in unspecified limb: Secondary | ICD-10-CM

## 2013-08-25 DIAGNOSIS — D6859 Other primary thrombophilia: Secondary | ICD-10-CM | POA: Insufficient documentation

## 2013-08-25 DIAGNOSIS — M79669 Pain in unspecified lower leg: Secondary | ICD-10-CM

## 2013-08-25 DIAGNOSIS — D6851 Activated protein C resistance: Secondary | ICD-10-CM

## 2013-08-25 DIAGNOSIS — Z86711 Personal history of pulmonary embolism: Secondary | ICD-10-CM

## 2013-08-25 DIAGNOSIS — Z86718 Personal history of other venous thrombosis and embolism: Secondary | ICD-10-CM

## 2013-08-25 NOTE — Telephone Encounter (Signed)
left message for patient to return call to schedule new patient appt.

## 2013-08-25 NOTE — Progress Notes (Signed)
Lower venous duplex bilateral complete 

## 2013-08-26 ENCOUNTER — Telehealth: Payer: Self-pay | Admitting: Hematology and Oncology

## 2013-08-26 NOTE — Telephone Encounter (Signed)
CALLED PATIENT TO [facility] NP APPT WAS NOT ABLE TO LEAVE A MESSAGE.

## 2013-08-31 ENCOUNTER — Telehealth: Payer: Self-pay | Admitting: Hematology and Oncology

## 2013-08-31 NOTE — Telephone Encounter (Signed)
LEFT MESSAGE FOR PATIENT TO RETURN CALL TO SCHEDULE NEW PATIENT APPT.  °

## 2013-09-01 ENCOUNTER — Ambulatory Visit (INDEPENDENT_AMBULATORY_CARE_PROVIDER_SITE_OTHER): Payer: BC Managed Care – PPO | Admitting: Pulmonary Disease

## 2013-09-01 ENCOUNTER — Encounter: Payer: Self-pay | Admitting: Pulmonary Disease

## 2013-09-01 VITALS — BP 140/108 | HR 79 | Temp 97.6°F | Ht 67.0 in | Wt 262.0 lb

## 2013-09-01 DIAGNOSIS — R0609 Other forms of dyspnea: Secondary | ICD-10-CM

## 2013-09-01 DIAGNOSIS — R0683 Snoring: Secondary | ICD-10-CM

## 2013-09-01 DIAGNOSIS — R0989 Other specified symptoms and signs involving the circulatory and respiratory systems: Secondary | ICD-10-CM

## 2013-09-01 NOTE — Assessment & Plan Note (Signed)
The patient has a history of loud snoring, and there are many factors in his history that is suggestive of clinically significant sleep apnea. I have had a long discussion with him about sleep apnea, including its impact to his quality of life and cardiovascular health. I think he would benefit from a sleep study, and the patient is agreeable to this approach. We'll arrange followup once his sleep study results are available.

## 2013-09-01 NOTE — Progress Notes (Signed)
Subjective:    Patient ID: KARANVIR BALDERSTON, male    DOB: [birth year], 48 y.o.   MRN: [id number]  HPI The patient is a 48 year old male who I've been asked to see for possible obstructive sleep apnea. He has been noted to have very loud snoring by his bed partner, but she has not mentioned an abnormal breathing pattern during sleep. The patient has frequent awakenings at night, and is not rested in the mornings upon arising. He describes intermittent sleep pressure during the day, but stays extremely busy at his job. He will fall asleep easily in the evenings watching television or movies, and does note sleep pressure driving longer distances. The patient states that his weight is up 20 pounds over the last one year, and his Epworth score today is 12.   Sleep Questionnaire What time do you typically go to bed?( Between what hours) 11:00-12:00 11:00-12:00 at 1143 on 09/01/13 by Len Blalock, CMA How long does it take you to fall asleep? 30 minutes 30 minutes at 1143 on 09/01/13 by Len Blalock, CMA How many times during the night do you wake up? 3 3 at 1143 on 09/01/13 by Len Blalock, CMA What time do you get out of bed to start your day? 0430 0430 at 1143 on 09/01/13 by Len Blalock, CMA Do you drive or operate heavy machinery in your occupation? Yes Yes at 1143 on 09/01/13 by Len Blalock, CMA How much has your weight changed (up or down) over the past two years? (In pounds) 25 lb (11.34 kg)25 lb (11.34 kg) increase at 1143 on 09/01/13 by Len Blalock, CMA Have you ever had a sleep study before? No No at 1143 on 09/01/13 by Len Blalock, CMA Do you currently use CPAP? No No at 1143 on 09/01/13 by Len Blalock, CMA Do you wear oxygen at any time? No No at 1143 on 09/01/13 by Len Blalock, CMA   Review of Systems  Constitutional: Positive for unexpected weight change. Negative for fever.  HENT: Positive for sinus pressure  and sneezing. Negative for congestion, dental problem, ear pain, nosebleeds, postnasal drip, rhinorrhea, sore throat and trouble swallowing.   Eyes: Negative for redness and itching.  Respiratory: Positive for chest tightness and shortness of breath. Negative for cough and wheezing.   Cardiovascular: Negative for palpitations and leg swelling.  Gastrointestinal: Negative for nausea and vomiting.  Genitourinary: Negative for dysuria.  Musculoskeletal: Negative for joint swelling.  Skin: Negative for rash.  Neurological: Negative for headaches.  Hematological: Does not bruise/bleed easily.  Psychiatric/Behavioral: Negative for dysphoric mood. The patient is not nervous/anxious.        Objective:   Physical Exam Constitutional:  Overweight, no acute distress  HENT:  Nares patent without discharge, mild septal deviation to the left.  Oropharynx without exudate, palate and uvula are thickened and very elongated.   Eyes:  Perrla, eomi, no scleral icterus  Neck:  No JVD, no TMG  Cardiovascular:  Normal rate, regular rhythm, no rubs or gallops.  No murmurs        Intact distal pulses but decreased.  Pulmonary :  Normal breath sounds, no stridor or respiratory distress   No rales, rhonchi, or wheezing  Abdominal:  Soft, nondistended, bowel sounds present.  No tenderness noted.   Musculoskeletal:  No lower extremity edema noted.  Lymph Nodes:  No cervical lymphadenopathy noted  Skin:  No cyanosis noted  Neurologic:  Alert, appropriate, moves all  4 extremities without obvious deficit.         Assessment & Plan:

## 2013-09-01 NOTE — Patient Instructions (Signed)
Will schedule for home sleep testing, and will call you with results. Stay off back while sleeping, and work on weight loss.

## 2013-09-30 DIAGNOSIS — G4730 Sleep apnea, unspecified: Secondary | ICD-10-CM

## 2013-09-30 HISTORY — DX: Sleep apnea, unspecified: G47.30

## 2013-10-05 ENCOUNTER — Telehealth: Payer: Self-pay | Admitting: Pulmonary Disease

## 2013-10-05 NOTE — Telephone Encounter (Signed)
ATC - VM not set up yet - Will try back later - Spoke with [facility] and pt is next in line to be called in for home sleep test.  Pt should received call by end of this week or first of next week.

## 2013-10-05 NOTE — Telephone Encounter (Signed)
Pt called back- he is aware that he should get a call by the end of this week/first of next week to get "[facility]" home sleep study unit. Nothing further needed at this time.

## 2013-10-06 ENCOUNTER — Ambulatory Visit (INDEPENDENT_AMBULATORY_CARE_PROVIDER_SITE_OTHER): Payer: BC Managed Care – PPO | Admitting: Internal Medicine

## 2013-10-06 ENCOUNTER — Other Ambulatory Visit (INDEPENDENT_AMBULATORY_CARE_PROVIDER_SITE_OTHER): Payer: BC Managed Care – PPO

## 2013-10-06 ENCOUNTER — Encounter: Payer: Self-pay | Admitting: Internal Medicine

## 2013-10-06 VITALS — BP 134/84 | HR 92 | Temp 98.0°F | Resp 15 | Wt 263.0 lb

## 2013-10-06 DIAGNOSIS — R079 Chest pain, unspecified: Secondary | ICD-10-CM

## 2013-10-06 DIAGNOSIS — R0600 Dyspnea, unspecified: Secondary | ICD-10-CM

## 2013-10-06 DIAGNOSIS — R0989 Other specified symptoms and signs involving the circulatory and respiratory systems: Secondary | ICD-10-CM

## 2013-10-06 DIAGNOSIS — R0609 Other forms of dyspnea: Secondary | ICD-10-CM

## 2013-10-06 DIAGNOSIS — M25569 Pain in unspecified knee: Secondary | ICD-10-CM

## 2013-10-06 DIAGNOSIS — R635 Abnormal weight gain: Secondary | ICD-10-CM

## 2013-10-06 DIAGNOSIS — M25562 Pain in left knee: Secondary | ICD-10-CM

## 2013-10-06 LAB — TSH: TSH: 1.44 u[IU]/mL (ref 0.35–4.50)

## 2013-10-06 MED ORDER — MELOXICAM 7.5 MG PO TABS: 7.5000 mg | ORAL_TABLET | Freq: Every day | ORAL | Status: DC

## 2013-10-06 MED ORDER — SILDENAFIL CITRATE 20 MG PO TABS: 20.0000 mg | ORAL_TABLET | Freq: Three times a day (TID) | ORAL | Status: DC

## 2013-10-06 NOTE — Progress Notes (Signed)
Subjective:    Patient ID: [name], male    DOB: 1965/09/14, 48 y.o.   MRN: [id number]  HPI   He's had intermittent pain in the left knee and leg since 10/09/11 when he had his surgery. That was complicated by deep venous thrombosis and pulmonary thromboemboli  He now presents with exacerbation of the pain at the left knee and popliteal space associated with cramping and pain. He intermittently notes some discoloration of the skin over the shin on the left  The pain is associated with swelling and stiffness in the knee.  He also has cramping in the gastrocnemius area   Since his last office visit he states he's had chest pain & shortness of breath at rest on 5 occasions. He is described as substernal and lasting up to 30 minutes. It is not exertional. His last episode was yesterday. There is no associated nausea or diaphoresis with the chest pain     Review of Systems  He is concerned that he has gained 20 pounds over the last several months. He also questions the significance of a knot on his left hand  He describes significant congestion the left nose which he feels is causing snoring       Objective:   Physical Exam Positive findings include pain with range of motion of the left knee with suggestion of a slight effusion. There is tenderness to palpation in the left popliteal space.Homans sign clinically is negative. There is a small ganglion of the dorsum of the left hand. There is exfoliative dermatitis of the feet. There is significant associated  food odor. Gen.: Adequately nourished in appearance. Weight excess.Alert, appropriate and cooperative throughout exam. Appears younger than stated age  Head: Normocephalic without obvious abnormalities  Eyes: No corneal or conjunctival inflammation noted. Pupils equal round reactive to light and accommodation.  Nose: External nasal exam reveals no deformity or inflammation. Nasal mucosa are pink and moist. No lesions or  exudates noted.   Mouth: Oral mucosa and oropharynx reveal no lesions or exudates. Teeth in good repair. Neck: No deformities, masses, or tenderness noted.  Thyroid normal.. Lungs: Normal respiratory effort; chest expands symmetrically. Lungs are clear to auscultation without rales, wheezes, or increased work of breathing. Heart: Normal rate and rhythm. Normal S1 and S2. No gallop, click, or rub. No murmur. Abdomen: Bowel sounds normal; abdomen soft and nontender. No masses, organomegaly or hernias noted.                                Musculoskeletal/extremities: No deformity or scoliosis noted of  the thoracic or lumbar spine.  No clubbing, cyanosis, edema, or significant extremity  deformity noted. Tone & strength normal. Hand joints normal Fingernail / toenail health good. Able to lie down & sit up w/o help.  Vascular: Carotid, radial artery, dorsalis pedis and  posterior tibial pulses are full and equal. No bruits present. Neurologic: Alert and oriented x3.  Gait normal   Skin: Intact without suspicious lesions or rashes. Lymph: No cervical, axillary lymphadenopathy present. Psych: Mood and affect are normal. Normally interactive  Assessment & Plan:  #1 Knee pain suggestion of degenerative joint disease associated with popliteal cyst  #2 recurrent nonexertional chest pain and dyspnea in the context of prior history of pulmonary thromboemboli  #3 significant weight gain  See orders and recommendations.

## 2013-10-06 NOTE — Progress Notes (Signed)
Pre visit review using our clinic review tool, if applicable. No additional management support is needed unless otherwise documented below in the visit note. 

## 2013-10-06 NOTE — Patient Instructions (Addendum)
Use an anti-inflammatory cream such as Aspercreme or Zostrix cream twice a day to the affected area as needed. In lieu of this warm moist compresses or  hot water bottle can be used. Do not apply ice . Plain Mucinex (NOT D) for thick secretions ;force NON dairy fluids .   Nasal cleansing in the shower as discussed with lather of mild shampoo.After 10 seconds wash off lather while  exhaling through nostrils. Make sure that all residual soap is removed to prevent irritation.  Flonase OR Nasacort AQ 1 spray in each nostril twice a day as needed. Use the "crossover" technique into opposite nostril spraying toward opposite ear @ 45 degree angle, not straight up into nostril.  Use a Neti pot daily only  as needed for significant sinus congestion; going from open side to congested side . Plain Allegra (NOT D )  160 daily , Loratidine 10 mg , OR Zyrtec 10 mg @ bedtime  as needed for itchy eyes & sneezing.  If knee pain fails to respond to the medication and topical agent; I recommend consultation with Dr. Tamala Julian, sports medicine specialist.

## 2013-10-07 LAB — D-DIMER, QUANTITATIVE: D-Dimer, Quant: 0.52 ug/mL-FEU — ABNORMAL HIGH (ref 0.00–0.48)

## 2013-10-10 NOTE — Telephone Encounter (Signed)
Tried to contact pt to set up home sleep test. VM says # is not set up yet.?? But this is the # pt called our office on.  Left msg @ alt contact # to have pt call the office re; home sleep test. .Alison Stalling

## 2013-10-11 DIAGNOSIS — R0609 Other forms of dyspnea: Secondary | ICD-10-CM

## 2013-10-11 DIAGNOSIS — R0989 Other specified symptoms and signs involving the circulatory and respiratory systems: Secondary | ICD-10-CM

## 2013-10-11 DIAGNOSIS — G4733 Obstructive sleep apnea (adult) (pediatric): Secondary | ICD-10-CM

## 2013-10-18 ENCOUNTER — Encounter: Payer: Self-pay | Admitting: Family Medicine

## 2013-10-18 ENCOUNTER — Ambulatory Visit (INDEPENDENT_AMBULATORY_CARE_PROVIDER_SITE_OTHER): Payer: BC Managed Care – PPO | Admitting: Family Medicine

## 2013-10-18 ENCOUNTER — Encounter: Payer: Self-pay | Admitting: *Deleted

## 2013-10-18 VITALS — BP 138/88 | HR 93 | Ht 66.0 in | Wt 262.0 lb

## 2013-10-18 DIAGNOSIS — M1710 Unilateral primary osteoarthritis, unspecified knee: Secondary | ICD-10-CM

## 2013-10-18 DIAGNOSIS — M25561 Pain in right knee: Secondary | ICD-10-CM

## 2013-10-18 DIAGNOSIS — M25569 Pain in unspecified knee: Secondary | ICD-10-CM

## 2013-10-18 DIAGNOSIS — M25562 Pain in left knee: Principal | ICD-10-CM

## 2013-10-18 NOTE — Progress Notes (Signed)
[name] [address] [place], [place] [address] Phone: [phone] Subjective:    I'm seeing this patient by the request  of:  [name] Cobble, MD   CC: Bilateral knee pain  HWE:[id number] [name] is a 48 y.o. male coming in with complaint of bilateral knee pain. Patient has had this pain for multiple years. Patient does work multiple jobs and is on concrete floors for an extended amount of time. Patient is notice more pain on the inside of both knees. Patient does have a past medical history significant for a meniscectomy of the left knee greater than 2 years ago. Patient states at this to make any improvement and did have a pulmonary embolism after the surgery. Patient has tried some over-the-counter medications without any significant improvement has not taken regularly. Patient has tried changing shoes which has been helpful. Patient states that he does have a chronic dull aching pain at all times in a sharp pain with ambulation with the severity of 8/10. Sometimes does have nighttime awakening.     Past medical history, social, surgical and family history all reviewed in electronic medical record.   Review of Systems: No headache, visual changes, nausea, vomiting, diarrhea, constipation, dizziness, abdominal pain, skin rash, fevers, chills, night sweats, weight loss, swollen lymph nodes, body aches, joint swelling, muscle aches, chest pain, shortness of breath, mood changes.   Objective Blood pressure 138/88, pulse 93, height 5\' 6"  (1.676 m), weight 262 lb (118.842 kg), SpO2 96.00%.  General: No apparent distress alert and oriented x3 mood and affect normal, dressed appropriately.  HEENT: Pupils equal, extraocular movements intact  Respiratory: Patient's speak in full sentences and does not appear short of breath  Cardiovascular: No lower extremity edema, non tender, no erythema  Skin: Warm dry intact with no signs of infection or rash on  extremities or on axial skeleton.  Abdomen: Soft nontender  Neuro: Cranial nerves II through XII are intact, neurovascularly intact in all extremities with 2+ DTRs and 2+ pulses.  Lymph: No lymphadenopathy of posterior or anterior cervical chain or axillae bilaterally.  Gait normal with good balance and coordination.  MSK:  Non tender with full range of motion and good stability and symmetric strength and tone of shoulders, elbows, wrist, hip, and ankles bilaterally.  Knee: Bilateral Normal to inspection with no erythema or effusion or obvious bony abnormalities. Bilateral medial joint line tenderness. ROM full in flexion and extension and lower leg rotation. Ligaments with solid consistent endpoints including ACL, PCL, LCL, MCL. Negative Mcmurray's, Apley's, and Thessalonian tests. painful patellar compression. Patellar glide with moderate crepitus. Patellar and quadriceps tendons unremarkable. Hamstring and quadriceps strength is normal.   MSK US performed of: Bilateral This study was ordered, performed, and interpreted by Charlann Boxer D.O.  Knee: All structures visualized. Patient does have degenerative changes of the medial meniscus on the right side patient has had a meniscectomy on the left side of the medial meniscus. Osteophytic changes seen on the medial and lateral joint lines. Patellar Tendon unremarkable on long and transverse views without effusion. No abnormality of prepatellar bursa. LCL and MCL unremarkable on long and transverse views. No abnormality of origin of medial or lateral head of the gastrocnemius.  IMPRESSION:  Moderate osteoarthritis bilaterally mostly of the medial compartments with surgical changes of the left knee noted.  After informed written and verbal consent, patient was seated on exam table. Right knee was prepped with alcohol swab and utilizing anterolateral approach, patient's  right knee space was injected with 4:1  marcaine 0.5%: Kenalog 40mg /dL.  Patient tolerated the procedure well without immediate complications.  After informed written and verbal consent, patient was seated on exam table. Left knee was prepped with alcohol swab and utilizing anterolateral approach, patient's left knee space was injected with 4:1  marcaine 0.5%: Kenalog 40mg /dL. Patient tolerated the procedure well without immediate complications.    Impression and Recommendations:     This case required medical decision making of moderate complexity.

## 2013-10-18 NOTE — Assessment & Plan Note (Addendum)
Bilateral knee injections today. We discussed icing protocol and over-the-counter medications he could be beneficial. We discussed home exercise program was given a handout. Patient was also given a brace was fitted by me today. Patient will try these interventions and come back and see me in 3 weeks. If patient is not doing any better we will consider imaging such as x-rays and consider formal physical therapy. Patient has done physical therapy previously and does have mild instability. This would make patient a candidate for viscous supplementation we can discuss this at further followup also discussed the importance of weight loss.

## 2013-10-18 NOTE — Patient Instructions (Signed)
Good to meet you We can repeat the injeciton every 3 months.  Ice 20 minutes at end of day would help Wear brace with activity.  Take tylenol 650 mg three times a day is the best evidence based medicine we have for arthritis.  Glucosamine sulfate 750mg  twice a day is a supplement that has been shown to help moderate to severe arthritis. Vitamin D 2000 IU daily Fish oil 2 grams daily.  Tumeric 500mg  twice daily.  Capsaicin topically up to four times a day may also help with pain. Cortisone injections are an option if these interventions do not seem to make a difference or need more relief.  If cortisone injections do not help, there are different types of shots that may help but they take longer to take effect.  We can discuss this at follow up.  It's important that you continue to stay active. Controlling your weight is important.  Consider physical therapy to strengthen muscles around the joint that hurts to take pressure off of the joint itself. Shoe inserts with good arch support may be helpful.  [employer] orthotics at [employer] [facility] could help.  Water aerobics and cycling with low resistance are the best two types of exercise for arthritis. Come back and see me in 3 weeks.

## 2013-10-19 ENCOUNTER — Telehealth: Payer: Self-pay | Admitting: Pulmonary Disease

## 2013-10-19 ENCOUNTER — Other Ambulatory Visit: Payer: Self-pay | Admitting: Internal Medicine

## 2013-10-19 ENCOUNTER — Telehealth: Payer: Self-pay | Admitting: *Deleted

## 2013-10-19 DIAGNOSIS — G4730 Sleep apnea, unspecified: Secondary | ICD-10-CM

## 2013-10-19 DIAGNOSIS — G4710 Hypersomnia, unspecified: Secondary | ICD-10-CM

## 2013-10-19 NOTE — Telephone Encounter (Signed)
OK X1, R X1 

## 2013-10-19 NOTE — Telephone Encounter (Signed)
Possibly but insurance would likely have given me toruble if I would have given both at same time.  We can give him one at follow up if he wants.

## 2013-10-19 NOTE — Telephone Encounter (Signed)
Needs ov to review sleep study 

## 2013-10-19 NOTE — Telephone Encounter (Signed)
Left message on VM for pt to return call to [facility].

## 2013-10-19 NOTE — Telephone Encounter (Signed)
Pt called requesting whether he needs a right knee brace as well.  Pt was seen on 5.19.15.  Please advise

## 2013-10-20 ENCOUNTER — Telehealth: Payer: Self-pay

## 2013-10-20 ENCOUNTER — Telehealth: Payer: Self-pay | Admitting: Family Medicine

## 2013-10-20 ENCOUNTER — Encounter: Payer: Self-pay | Admitting: Pulmonary Disease

## 2013-10-20 NOTE — Telephone Encounter (Signed)
Patient states that the brace he actually received was a RT knee brace Size L.  He says that L fits him a little snug.  He is now wanting to try a LT knee brace size XL.

## 2013-10-20 NOTE — Telephone Encounter (Signed)
Scheduled appt on 6/17 per ptatients request. Nothing further is needed at this time.

## 2013-10-20 NOTE — Telephone Encounter (Signed)
Received a fax from [facility] in [place] asking if we had a recent phone number on the patient. I faxed them back letting them know the phone numbers they have that is what we have. [facility] asked if his chart can be noted to contact them with payment for the Revatio 20 mg that was sent over today.

## 2013-10-20 NOTE — Telephone Encounter (Signed)
[email or web address].Jennifer Castillo, CMA  

## 2013-10-21 NOTE — Telephone Encounter (Signed)
Left msg letting pt know to stop by the office to ft him for another brace.

## 2013-10-23 ENCOUNTER — Encounter (HOSPITAL_COMMUNITY): Payer: Self-pay | Admitting: Emergency Medicine

## 2013-10-23 ENCOUNTER — Emergency Department (HOSPITAL_COMMUNITY): Payer: BC Managed Care – PPO

## 2013-10-23 ENCOUNTER — Emergency Department (HOSPITAL_COMMUNITY)
Admission: EM | Admit: 2013-10-23 | Discharge: 2013-10-23 | Disposition: A | Discharge status: Home / Self Care | Payer: BC Managed Care – PPO | Attending: Emergency Medicine | Admitting: Emergency Medicine

## 2013-10-23 ENCOUNTER — Other Ambulatory Visit: Payer: Self-pay

## 2013-10-23 DIAGNOSIS — Z86718 Personal history of other venous thrombosis and embolism: Secondary | ICD-10-CM | POA: Insufficient documentation

## 2013-10-23 DIAGNOSIS — R0600 Dyspnea, unspecified: Secondary | ICD-10-CM

## 2013-10-23 DIAGNOSIS — IMO0002 Reserved for concepts with insufficient information to code with codable children: Secondary | ICD-10-CM | POA: Insufficient documentation

## 2013-10-23 DIAGNOSIS — R079 Chest pain, unspecified: Secondary | ICD-10-CM | POA: Insufficient documentation

## 2013-10-23 DIAGNOSIS — Z9889 Other specified postprocedural states: Secondary | ICD-10-CM | POA: Insufficient documentation

## 2013-10-23 DIAGNOSIS — Z8739 Personal history of other diseases of the musculoskeletal system and connective tissue: Secondary | ICD-10-CM | POA: Insufficient documentation

## 2013-10-23 DIAGNOSIS — R0989 Other specified symptoms and signs involving the circulatory and respiratory systems: Principal | ICD-10-CM | POA: Insufficient documentation

## 2013-10-23 DIAGNOSIS — Z86711 Personal history of pulmonary embolism: Secondary | ICD-10-CM | POA: Insufficient documentation

## 2013-10-23 DIAGNOSIS — R0609 Other forms of dyspnea: Secondary | ICD-10-CM | POA: Insufficient documentation

## 2013-10-23 DIAGNOSIS — I10 Essential (primary) hypertension: Secondary | ICD-10-CM | POA: Insufficient documentation

## 2013-10-23 LAB — CBC
HCT: 43.1 % (ref 39.0–52.0)
Hemoglobin: 15.2 g/dL (ref 13.0–17.0)
MCH: 29.6 pg (ref 26.0–34.0)
MCHC: 35.3 g/dL (ref 30.0–36.0)
MCV: 83.9 fL (ref 78.0–100.0)
PLATELETS: 258 10*3/uL (ref 150–400)
RBC: 5.14 MIL/uL (ref 4.22–5.81)
RDW: 13.3 % (ref 11.5–15.5)
WBC: 14.8 10*3/uL — AB (ref 4.0–10.5)

## 2013-10-23 LAB — BASIC METABOLIC PANEL
BUN: 19 mg/dL (ref 6–23)
CALCIUM: 9.1 mg/dL (ref 8.4–10.5)
CHLORIDE: 101 meq/L (ref 96–112)
CO2: 18 mEq/L — ABNORMAL LOW (ref 19–32)
Creatinine, Ser: 0.96 mg/dL (ref 0.50–1.35)
GFR calc Af Amer: >90 mL/min (ref >90)
GFR calc non Af Amer: >90 mL/min (ref >90)
Glucose, Bld: 112 mg/dL — ABNORMAL HIGH (ref 70–99)
Potassium: 3.6 mEq/L — ABNORMAL LOW (ref 3.7–5.3)
Sodium: 135 mEq/L — ABNORMAL LOW (ref 137–147)

## 2013-10-23 LAB — PRO B NATRIURETIC PEPTIDE: PRO B NATRI PEPTIDE: 23.8 pg/mL (ref 0–125)

## 2013-10-23 LAB — TROPONIN I: Troponin I: <0.3 ng/mL (ref <0.30)

## 2013-10-23 MED ORDER — MORPHINE SULFATE 4 MG/ML IJ SOLN
6.0000 mg | Freq: Once | INTRAMUSCULAR | Status: AC
  Administered 2013-10-23: 6 mg via INTRAVENOUS
  Filled 2013-10-23: qty 2

## 2013-10-23 MED ORDER — SODIUM CHLORIDE 0.9 % IV BOLUS (SEPSIS)
500.0000 mL | Freq: Once | INTRAVENOUS | Status: AC
  Administered 2013-10-23: 500 mL via INTRAVENOUS

## 2013-10-23 MED ORDER — IOHEXOL 350 MG/ML SOLN
80.0000 mL | Freq: Once | INTRAVENOUS | Status: AC | PRN
  Administered 2013-10-23: 100 mL via INTRAVENOUS

## 2013-10-23 NOTE — ED Provider Notes (Signed)
CSN: [id number]     Arrival date & time 10/23/13  0031 History   First MD Initiated Contact with Patient 10/23/13 0239     Chief Complaint  Patient presents with  . Shortness of Breath     (Consider location/radiation/quality/duration/timing/severity/associated sxs/prior Treatment) HPI Comments: 48 year old male with lipids, high blood pressure, pulmonary embolism, factor V Leiden mutation presents with worsening shortness of breath this morning. Patient said similar in the past with his blood clot history. No fevers chills or cough. Patient is nonspecific bilateral chest discomfort, nonradiating, nonexertional. No history of cardiac problems and no exertional symptoms. No diaphoresis. Patient nonsmoker.  Patient is a 48 y.o. male presenting with shortness of breath. The history is provided by the patient.  Shortness of Breath Associated symptoms: chest pain   Associated symptoms: no abdominal pain, no fever, no headaches, no neck pain, no rash and no vomiting     Past Medical History  Diagnosis Date  . Cluster headache   . HTN (hypertension)   . Rotator cuff arthropathy 2007    right  . DVT of lower extremity (deep venous thrombosis) 6/26-28/2013    Xarelto   . Saddle pulmonary embolus 6/26-28/2013    post orthopedic surgery  . Irritability and anger     PMH of   Past Surgical History  Procedure Laterality Date  . Wisdom tooth extraction    . Cardiac catheterization  2007    Left ventricular dysfunction; normal coronaries  . Upper gastrointestinal endoscopy  2007  . Arthroscopic knee surgery  10/09/2011    Dr Ninfa Linden  . Rotator cuff repair  2007    right   Family History  Problem Relation Age of Onset  . Cancer Mother     Mesothelioma   . Diabetes Brother   . Hypertension Brother   . Stroke Brother     83  . Diabetes Maternal Aunt   . Diabetes Maternal Grandmother   . Heart disease Maternal Grandmother   . Hypertension Maternal Grandmother   . Stroke Maternal  Grandmother     in 34s  . Stroke Maternal Grandfather     > 55  . Migraines Brother   . Deep vein thrombosis Maternal Grandmother    History  Substance Use Topics  . Smoking status: Never Smoker   . Smokeless tobacco: Never Used  . Alcohol Use: No    Review of Systems  Constitutional: Positive for unexpected weight change (mild weight gain recently ). Negative for fever and chills.  HENT: Negative for congestion.   Eyes: Negative for visual disturbance.  Respiratory: Positive for shortness of breath.   Cardiovascular: Positive for chest pain. Negative for leg swelling.  Gastrointestinal: Negative for vomiting and abdominal pain.  Genitourinary: Negative for dysuria and flank pain.  Musculoskeletal: Negative for back pain, neck pain and neck stiffness.  Skin: Negative for rash.  Neurological: Negative for light-headedness and headaches.      Allergies  Review of patient's allergies indicates no known allergies.  Home Medications   Prior to Admission medications   Medication Sig Start Date End Date Taking? Authorizing Provider  fluticasone (FLONASE) 50 MCG/ACT nasal spray Place 2 sprays into both nostrils daily as needed for allergies or rhinitis.   Yes Historical Provider, MD   BP 128/83  Pulse 70  Temp(Src) 98.5 F (36.9 C)  Resp 19  Ht 5\' 7"  (1.702 m)  Wt 263 lb (119.296 kg)  BMI 41.18 kg/m2  SpO2 95% Physical Exam  Nursing note and  vitals reviewed. Constitutional: He is oriented to person, place, and time. He appears well-developed and well-nourished.  HENT:  Head: Normocephalic and atraumatic.  Eyes: Conjunctivae are normal. Right eye exhibits no discharge. Left eye exhibits no discharge.  Neck: Normal range of motion. Neck supple. No tracheal deviation present.  Cardiovascular: Normal rate, regular rhythm and intact distal pulses.   Pulmonary/Chest: Effort normal and breath sounds normal.  Abdominal: Soft. He exhibits no distension. There is no tenderness.  There is no guarding.  Musculoskeletal: He exhibits no edema and no tenderness.  Neurological: He is alert and oriented to person, place, and time.  Skin: Skin is warm. No rash noted.  Psychiatric: He has a normal mood and affect.    ED Course  Procedures (including critical care time) Labs Review Labs Reviewed  CBC - Abnormal; Notable for the following:    WBC 14.8 (*)    All other components within normal limits  BASIC METABOLIC PANEL - Abnormal; Notable for the following:    Sodium 135 (*)    Potassium 3.6 (*)    CO2 18 (*)    Glucose, Bld 112 (*)    All other components within normal limits  PRO B NATRIURETIC PEPTIDE  TROPONIN I    Imaging Review Dg Chest 2 View  10/23/2013   CLINICAL DATA:  Chest pain and heavy breathing.  History of smoking.  EXAM: CHEST  2 VIEW  COMPARISON:  Chest radiograph from 12/08/2011  FINDINGS: The lungs are mildly hypoexpanded. Mild bilateral atelectasis is noted. There is no evidence of pleural effusion or pneumothorax.  The heart is normal in size; the mediastinal contour is within normal limits. No acute osseous abnormalities are seen.  IMPRESSION: Mild bilateral atelectasis noted; lungs otherwise clear.   Electronically Signed   By: Garald Balding M.D.   On: 10/23/2013 02:16   Ct Angio Chest Pe W/cm &/or Wo Cm  10/23/2013   CLINICAL DATA:  Shortness of breath, generalized chest pain.  EXAM: CT ANGIOGRAPHY CHEST WITH CONTRAST  TECHNIQUE: Multidetector CT imaging of the chest was performed using the standard protocol during bolus administration of intravenous contrast. Multiplanar CT image reconstructions and MIPs were obtained to evaluate the vascular anatomy.  CONTRAST:  175mL OMNIPAQUE IOHEXOL 350 MG/ML SOLN  COMPARISON:  DG CHEST 2 VIEW dated 10/23/2013; CT ANGIO CHEST W/CM &/OR WO/CM dated 11/26/2011  FINDINGS: The main pulmonary artery is not enlarged. Interval resolution of saddle pulmonary embolus seen on prior CT. No pulmonary arterial filling  defects to at least the segmental branches on this mild respiratory motion degraded examination.  Heart size is upper limits of normal, pericardium is unremarkable. Two vessel aortic arch is a normal variant, with normal appearance of thoracic aorta. No lymphadenopathy by CT size criteria.  Mild vasculature congestion, no pleural effusions or focal consolidations. Tracheobronchial tree is patent and midline, no pneumothorax.  Included view of the abdomen is unremarkable. Soft tissues are nonsuspicious. Mild degenerative change of the thoracic spine.  Review of the MIP images confirms the above findings.  IMPRESSION: No acute pulmonary embolism on this mildly respiratory motion degraded examination. Resolution of saddle pulmonary embolism seen on prior examination.  Borderline cardiomegaly with mild vasculature congestion.   Electronically Signed   By: Elon Alas   On: 10/23/2013 06:36     EKG Interpretation   Date/Time:  Sunday Oct 23 2013 00:41:11 EDT Ventricular Rate:  94 PR Interval:  128 QRS Duration: 96 QT Interval:  352 QTC Calculation: 440  R Axis:   32 Text Interpretation:  Normal sinus rhythm Incomplete right bundle branch  block Nonspecific T wave abnormality Abnormal ECG Confirmed by Eustacia Urbanek  MD,  Madyx Delfin ([id number]) on 10/23/2013 7:27:12 AM       MDM   Final diagnoses:  Dyspnea   Well-appearing male with normal vital signs and worsening dyspnea. Lungs clear in exam. No clinical concern for pneumonia at this time. With blood clot history and no current blood thinners PE on the differential. CT angina the chest was negative for pulmonary embolism. Patient improved in ED and no chest pain on recheck. Troponin negative and EKG no acute ischemia findings. Close followup outpatient discussed. I personally to walking pulse ox in the room with 97 -100%.  Results and differential diagnosis were discussed with the patient/parent/guardian. Close follow up outpatient was discussed,  comfortable with the plan.   Filed Vitals:   10/23/13 0445 10/23/13 0500 10/23/13 0634 10/23/13 0700  BP: 122/74 104/67 136/83 128/83  Pulse: 78 68 68 70  Temp:      Resp: 33 24 23 19   Height:      Weight:      SpO2: 98% 97% 94% 95%           Mariea Clonts, MD 10/23/13 [id number]

## 2013-10-23 NOTE — ED Notes (Signed)
Repeated walking O2 sat in room and pt maintained sat at 99%. Dr. Reather Converse back in with patient.

## 2013-10-23 NOTE — ED Notes (Signed)
Patient to radiology became lighted headed, increase respiratory rate. Vitals taken, WNLs. Patient encouraged to slow breathing. Patient remains in triage.

## 2013-10-23 NOTE — ED Notes (Signed)
The pt is c/o sob all day and tonight he has has generalized chest pain he is hyperventilating with both eyes closed.  The male withnhim reports that he has been breathing this way all day.   No bp med taken for one year

## 2013-10-23 NOTE — Discharge Instructions (Signed)
Followup closely with your physician for recheck. Return to the ER for worsening shortness of breath or chest pain.  If you were given medicines take as directed.  If you are on coumadin or contraceptives realize their levels and effectiveness is altered by many different medicines.  If you have any reaction (rash, tongues swelling, other) to the medicines stop taking and see a physician.   Please follow up as directed and return to the ER or see a physician for new or worsening symptoms.  Thank you. Filed Vitals:   10/23/13 0445 10/23/13 0500 10/23/13 0634 10/23/13 0700  BP: 122/74 104/67 136/83 128/83  Pulse: 78 68 68 70  Temp:      Resp: 33 24 23 19   Height:      Weight:      SpO2: 98% 97% 94% 95%

## 2013-10-23 NOTE — ED Notes (Signed)
Per MD order, pt ambulated on room air.  Pt O2 saturation noted to drop to 87% on room air and HR increased from 82 to 97.  MD made aware

## 2013-11-08 ENCOUNTER — Ambulatory Visit (INDEPENDENT_AMBULATORY_CARE_PROVIDER_SITE_OTHER): Payer: BC Managed Care – PPO | Admitting: Family Medicine

## 2013-11-08 ENCOUNTER — Encounter: Payer: Self-pay | Admitting: Family Medicine

## 2013-11-08 VITALS — BP 136/84 | HR 99 | Ht 67.0 in | Wt 261.0 lb

## 2013-11-08 DIAGNOSIS — M1710 Unilateral primary osteoarthritis, unspecified knee: Secondary | ICD-10-CM

## 2013-11-08 NOTE — Assessment & Plan Note (Signed)
Patient overall is doing fairly well. Patient has made improvement and was given a brace today for his contralateral knee. We discussed icing protocol and continuing home exercises. On this patient continues to do well we can see him every 3-4 months for further evaluation. And corticosteroid injections. Patient has any worsening pain over the course of the next several weeks and we can also consider Visco supplementation.  Spent greater than 25 minutes with patient face-to-face and had greater than 50% of counseling including as described above in assessment and plan.

## 2013-11-08 NOTE — Progress Notes (Signed)
  [name] [address] [place], [place] [address] Phone: [phone] Subjective:     CC: Bilateral knee pain follow up  GNO:[id number] [name] is a 48 y.o. male coming in with complaint of bilateral knee pain. ` Patient was seen previously and was diagnosed with more moderate arthritis. Patient did have steroid injections as well as home exercise program and knee brace. We discussed icing and was given home exercise. Patient states she is doing approximately 80% better. Patient states that he still has some mild discomfort at the end of the long day but overall he is doing much better. Patient is a do all activities of daily living. Patient is wearing a brace on his right knee and would like to have one for his left knee as well. Patient denies any giving on him at this time and states that he is resting comfortably at night..     Past medical history, social, surgical and family history all reviewed in electronic medical record.   Review of Systems: No headache, visual changes, nausea, vomiting, diarrhea, constipation, dizziness, abdominal pain, skin rash, fevers, chills, night sweats, weight loss, swollen lymph nodes, body aches, joint swelling, muscle aches, chest pain, shortness of breath, mood changes.   Objective Blood pressure 136/84, pulse 99, height 5\' 7"  (1.702 m), weight 261 lb (118.389 kg), SpO2 93.00%.  General: No apparent distress alert and oriented x3 mood and affect normal, dressed appropriately.  HEENT: Pupils equal, extraocular movements intact  Respiratory: Patient's speak in full sentences and does not appear short of breath  Cardiovascular: No lower extremity edema, non tender, no erythema  Skin: Warm dry intact with no signs of infection or rash on extremities or on axial skeleton.  Abdomen: Soft nontender  Neuro: Cranial nerves II through XII are intact, neurovascularly intact in all extremities with 2+ DTRs and 2+ pulses.    Lymph: No lymphadenopathy of posterior or anterior cervical chain or axillae bilaterally.  Gait normal with good balance and coordination.  MSK:  Non tender with full range of motion and good stability and symmetric strength and tone of shoulders, elbows, wrist, hip, and ankles bilaterally.  Knee: Bilateral Normal to inspection with no erythema or effusion or obvious bony abnormalities. Bilateral medial joint line tenderness is significantly less than previous exam.. ROM full in flexion and extension and lower leg rotation. Ligaments with solid consistent endpoints including ACL, PCL, LCL, MCL. Negative Mcmurray's, Apley's, and Thessalonian tests. painful patellar compression. Patellar glide with moderate crepitus. Patellar and quadriceps tendons unremarkable. Hamstring and quadriceps strength is normal.      Impression and Recommendations:     This case required medical decision making of moderate complexity.

## 2013-11-08 NOTE — Patient Instructions (Signed)
Good to see you  Please go get xrays downstairs today.  Ice 20 minutes 2 times a day Wear the braces  Vitamin D 3 2000 IU daily Turmeric 500mg  twice daily Continue with the exercises IF pain worsens can repeat steroid injections every 3-4 months or can do viscous supplementation. [name]

## 2013-11-16 ENCOUNTER — Ambulatory Visit (INDEPENDENT_AMBULATORY_CARE_PROVIDER_SITE_OTHER): Payer: BC Managed Care – PPO | Admitting: Pulmonary Disease

## 2013-11-16 ENCOUNTER — Encounter: Payer: Self-pay | Admitting: Pulmonary Disease

## 2013-11-16 VITALS — BP 130/92 | HR 92 | Temp 97.8°F | Ht 67.0 in | Wt 268.0 lb

## 2013-11-16 DIAGNOSIS — R0683 Snoring: Secondary | ICD-10-CM

## 2013-11-16 DIAGNOSIS — G4733 Obstructive sleep apnea (adult) (pediatric): Secondary | ICD-10-CM

## 2013-11-16 DIAGNOSIS — R0989 Other specified symptoms and signs involving the circulatory and respiratory systems: Secondary | ICD-10-CM

## 2013-11-16 DIAGNOSIS — R0609 Other forms of dyspnea: Secondary | ICD-10-CM

## 2013-11-16 NOTE — Assessment & Plan Note (Signed)
The patient has moderate obstructive sleep apnea by his recent home sleep test, and has significant sleepiness secondary to his sleep disordered breathing and also his poor sleep hygiene. I have reviewed the study with him, and answered all of his questions. Given the severity of his sleep apnea and his significant sleepiness, I have recommended aggressive treatment with a trial of CPAP while he is working on weight loss. The patient is agreeable to this approach.

## 2013-11-16 NOTE — Patient Instructions (Signed)
Will start on cpap with a moderate pressure level.  Please call me if having issues with tolerance. Work on weight loss followup with me again in 8 weeks.

## 2013-11-16 NOTE — Progress Notes (Signed)
   Subjective:    Patient ID: [name], male    DOB: [birth year], 48 y.o.   MRN: [id number]  HPI Patient comes in today for followup of his recent sleep study. He was found to have moderate OSA, with an AHI of 24 events per hour. I have reviewed the study with him in detail, and answered all of his questions.   Review of Systems  Constitutional: Negative for fever and unexpected weight change.  HENT: Negative for congestion, dental problem, ear pain, nosebleeds, postnasal drip, rhinorrhea, sinus pressure, sneezing, sore throat and trouble swallowing.   Eyes: Negative for redness and itching.  Respiratory: Negative for cough, chest tightness, shortness of breath and wheezing.   Cardiovascular: Negative for palpitations and leg swelling.  Gastrointestinal: Negative for nausea and vomiting.  Genitourinary: Negative for dysuria.  Musculoskeletal: Negative for joint swelling.  Skin: Negative for rash.  Neurological: Negative for headaches.  Hematological: Does not bruise/bleed easily.  Psychiatric/Behavioral: Negative for dysphoric mood. The patient is not nervous/anxious.        Objective:   Physical Exam Overweight male in no acute distress Nose without purulence or discharge noted Neck without lymphadenopathy or thyromegaly Lower extremities without edema, no cyanosis Alert and oriented, moves all 4 extremities.       Assessment & Plan:

## 2013-12-20 ENCOUNTER — Ambulatory Visit (HOSPITAL_COMMUNITY)
Admission: RE | Admit: 2013-12-20 | Discharge: 2013-12-20 | Disposition: A | Discharge status: Home / Self Care | Payer: BC Managed Care – PPO | Source: Ambulatory Visit | Attending: Family Medicine | Admitting: Family Medicine

## 2013-12-20 ENCOUNTER — Encounter: Payer: Self-pay | Admitting: Family Medicine

## 2013-12-20 ENCOUNTER — Ambulatory Visit (INDEPENDENT_AMBULATORY_CARE_PROVIDER_SITE_OTHER): Payer: BC Managed Care – PPO | Admitting: Family Medicine

## 2013-12-20 ENCOUNTER — Encounter: Payer: Self-pay | Admitting: Internal Medicine

## 2013-12-20 VITALS — BP 128/84 | HR 85 | Ht 66.0 in | Wt 265.0 lb

## 2013-12-20 DIAGNOSIS — M7989 Other specified soft tissue disorders: Secondary | ICD-10-CM

## 2013-12-20 DIAGNOSIS — Z86711 Personal history of pulmonary embolism: Secondary | ICD-10-CM

## 2013-12-20 DIAGNOSIS — M1710 Unilateral primary osteoarthritis, unspecified knee: Secondary | ICD-10-CM

## 2013-12-20 DIAGNOSIS — R0602 Shortness of breath: Secondary | ICD-10-CM

## 2013-12-20 NOTE — Assessment & Plan Note (Signed)
Patient was given bilateral injections again today. Patient did tolerate the procedure well with fairly good resolution of pain. Patient will continue with the bracing, home exercises, icing, and medications previously prescribed. Patient will come back in 3 weeks. At that time patient had failed all conservative therapy and may be a candidate for viscous supplementation.  Spent greater than 25 minutes with patient face-to-face and had greater than 50% of counseling including as described above in assessment and plan.

## 2013-12-20 NOTE — Assessment & Plan Note (Signed)
Patient does have a personal history. Patient does have some mild increased swelling from previous exam I do not see significant amount. Patient did have a very large cell embolism but this was after orthopedic surgery. Patient has been off blood thinner for quite some time. Patient states though it does feel fairly similar to this time I would like to get an ultrasound as well as labs to rule out any type of a clot formation. Patient does if he feels any worse she will immediately go to the emergency department. Otherwise they'll call with the lab results and make changes as necessary. Patient is attempting to make followup with primary care provider in the near future.

## 2013-12-20 NOTE — Patient Instructions (Signed)
Good to see you Start aspirin 81mg  daily as well.  We will get labs downstairs and rule out clot with ultrasound.  Depending on that I will call you and we will discuss what to do.  Injections today. COnitnue the brace, ice is your friend.  Come back in 3 weeks and if still in pain we can start the other injections.  Synvisc is the name.

## 2013-12-20 NOTE — Progress Notes (Signed)
  [name] [address] [place], [place] [address] Phone: [phone] Subjective:     CC: Bilateral knee pain follow up  [id number] [name] is a 48 y.o. male coming in with complaint of bilateral knee pain. ` Patient was seen previously and was diagnosed with more moderate arthritis. Patient is now 10 weeks out from [place] an injection states that over the course last week the pain is significant significantly worse. Patient has been wearing the braces but is having pain at all times which seems to be constant. Patient is a severity pain is 9/10. Patient is also noticed some lower extremity swelling a little bit more especially of the left leg. Patient does have a past medical history significant for pulmonary embolism is not on any blood at this time. Denies any shortness of breath but has had some chest discomfort. Denies any cough and denies any significant chest pain. Patient was starting to have pain where he does not go up and down up and down stairs secondary to his knee pain.     Past medical history, social, surgical and family history all reviewed in electronic medical record.   Review of Systems: No headache, visual changes, nausea, vomiting, diarrhea, constipation, dizziness, abdominal pain, skin rash, fevers, chills, night sweats, weight loss, swollen lymph nodes, body aches, joint swelling, muscle aches, chest pain, shortness of breath, mood changes.   Objective Blood pressure 128/84, pulse 85, height 5\' 6"  (1.676 m), weight 265 lb (120.203 kg), SpO2 97.00%.  General: No apparent distress alert and oriented x3 mood and affect normal, dressed appropriately.  HEENT: Pupils equal, extraocular movements intact  Respiratory: Patient's speak in full sentences and does not appear short of breath  Cardiovascular: No lower extremity edema, non tender, no erythema  Skin: Warm dry intact with no signs of infection or rash on extremities or on  axial skeleton.  Abdomen: Soft nontender  Neuro: Cranial nerves II through XII are intact, neurovascularly intact in all extremities with 2+ DTRs and 2+ pulses.  Lymph: No lymphadenopathy of posterior or anterior cervical chain or axillae bilaterally.  Gait normal with good balance and coordination.  MSK:  Non tender with full range of motion and good stability and symmetric strength and tone of shoulders, elbows, wrist, hip, and ankles bilaterally.  Knee: Bilateral Normal to inspection with no erythema or effusion or obvious bony abnormalities. Bilateral medial joint line tenderness severe ROM full in flexion and extension and lower leg rotation. Ligaments with solid consistent endpoints including ACL, PCL, LCL, MCL. Negative Mcmurray's, Apley's, and Thessalonian tests. painful patellar compression. Patellar glide with moderate crepitus. Patellar and quadriceps tendons unremarkable. Hamstring and quadriceps strength is normal.   After informed written and verbal consent, patient was seated on exam table. Right knee was prepped with alcohol swab and utilizing anterolateral approach, patient's right knee space was injected with 4:1  marcaine 0.5%: Kenalog 40mg /dL. Patient tolerated the procedure well without immediate complications.  After informed written and verbal consent, patient was seated on exam table. Left knee was prepped with alcohol swab and utilizing anterolateral approach, patient's left knee space was injected with 4:1  marcaine 0.5%: Kenalog 40mg /dL. Patient tolerated the procedure well without immediate complications.    Impression and Recommendations:     This case required medical decision making of moderate complexity.

## 2013-12-20 NOTE — Progress Notes (Signed)
*  PRELIMINARY RESULTS* Vascular Ultrasound Left lower extremity venous duplex has been completed.  Preliminary findings: no evidence of DVT or baker's cyst.  Called results to Dr. Tamala Julian.  Landry Mellow, RDMS, RVT  12/20/2013, 5:56 PM

## 2014-01-10 ENCOUNTER — Ambulatory Visit (INDEPENDENT_AMBULATORY_CARE_PROVIDER_SITE_OTHER)
Admission: RE | Admit: 2014-01-10 | Discharge: 2014-01-10 | Disposition: A | Discharge status: Home / Self Care | Payer: BC Managed Care – PPO | Source: Ambulatory Visit | Attending: Family Medicine | Admitting: Family Medicine

## 2014-01-10 ENCOUNTER — Encounter: Payer: Self-pay | Admitting: Family Medicine

## 2014-01-10 ENCOUNTER — Ambulatory Visit (INDEPENDENT_AMBULATORY_CARE_PROVIDER_SITE_OTHER): Payer: BC Managed Care – PPO | Admitting: Family Medicine

## 2014-01-10 VITALS — BP 134/84 | HR 87 | Ht 67.0 in

## 2014-01-10 DIAGNOSIS — M25561 Pain in right knee: Secondary | ICD-10-CM

## 2014-01-10 DIAGNOSIS — M25562 Pain in left knee: Principal | ICD-10-CM

## 2014-01-10 DIAGNOSIS — M25569 Pain in unspecified knee: Secondary | ICD-10-CM

## 2014-01-10 DIAGNOSIS — M1710 Unilateral primary osteoarthritis, unspecified knee: Secondary | ICD-10-CM

## 2014-01-10 MED ORDER — TRAMADOL HCL 50 MG PO TABS: 50.0000 mg | ORAL_TABLET | Freq: Every evening | ORAL | Status: DC | PRN

## 2014-01-10 NOTE — Assessment & Plan Note (Signed)
Started Synvisc series today. Discussed continuing icing and bracing. We discussed other medication and prescription for tramadol was given. Patient is going to try these interventions and come back again in one week for the second in the series of Synvisc injections.

## 2014-01-10 NOTE — Patient Instructions (Signed)
Good to see you.  Synvisc started Continue the bracing Ice is good Tramadol at night.

## 2014-01-10 NOTE — Progress Notes (Signed)
  [name] [place], [place] [address] Phone: [phone] Subjective:     CC: Bilateral knee pain follow up  [id number] [name] is a 48 y.o. male coming in with complaint of bilateral knee pain. ` Patient was seen previously and was diagnosed with more moderate arthritis. Patient was given steroid injections at last followup. Patient also had a history significant for pulmonary embolism in because of what he felt was swelling and pain we did get Dopplers. These were negative. Patient states regarding his knee pain he continues to have difficulty. Patient states during injection only helped for a limited time. Patient continues to have the pain bilaterally left greater than right. States that the bracing does help overall. Patient does have a past medical history of the left knee having a posterior medial meniscal tear that did have an arthroscopic procedure. Patient states that the severity of pain is causing him to wake up at night and is making it difficult to do his job. Patient cannot be out of his job to to financial constraints.    Past medical history, social, surgical and family history all reviewed in electronic medical record.   Review of Systems: No headache, visual changes, nausea, vomiting, diarrhea, constipation, dizziness, abdominal pain, skin rash, fevers, chills, night sweats, weight loss, swollen lymph nodes, body aches, joint swelling, muscle aches, chest pain, shortness of breath, mood changes.   Objective Blood pressure 134/84, pulse 87, height 5\' 7"  (1.702 m).  General: No apparent distress alert and oriented x3 mood and affect normal, dressed appropriately.  HEENT: Pupils equal, extraocular movements intact  Respiratory: Patient's speak in full sentences and does not appear short of breath  Cardiovascular: No lower extremity edema, non tender, no erythema  Skin: Warm dry intact with no signs of infection or rash  on extremities or on axial skeleton.  Abdomen: Soft nontender  Neuro: Cranial nerves II through XII are intact, neurovascularly intact in all extremities with 2+ DTRs and 2+ pulses.  Lymph: No lymphadenopathy of posterior or anterior cervical chain or axillae bilaterally.  Gait normal with good balance and coordination.  MSK:  Non tender with full range of motion and good stability and symmetric strength and tone of shoulders, elbows, wrist, hip, and ankles bilaterally.  Knee: Bilateral Normal to inspection with no erythema or effusion or obvious bony abnormalities. Bilateral medial joint line tenderness [name] ROM full in flexion and extension and lower leg rotation. Ligaments with solid consistent endpoints including ACL, PCL, LCL, MCL. Mildly positive Mcmurray's, Apley's, and Thessalonian tests. painful patellar compression. Patellar glide with moderate crepitus. Patellar and quadriceps tendons unremarkable. Hamstring and quadriceps strength is normal.   After informed written and verbal consent, patient was seated on exam table. Right knee was prepped with alcohol swab and utilizing anterolateral approach, patient's right knee space was injected with 16 mg/2.5 mL of Synvisc (sodium hyaluronate) in a prefilled syringe was injected easily into the knee through a 22-gauge needle.  After informed written and verbal consent, patient was seated on exam table. Left knee was prepped with alcohol swab and utilizing anterolateral approach, patient's left knee space was injected with 16 mg/2.5 mL of Synvisc (sodium hyaluronate) in a prefilled syringe was injected easily into the knee through a 22-gauge needle.    Impression and Recommendations:     This case required medical decision making of moderate complexity.

## 2014-01-16 ENCOUNTER — Ambulatory Visit: Payer: BC Managed Care – PPO | Admitting: Pulmonary Disease

## 2014-01-17 ENCOUNTER — Encounter: Payer: Self-pay | Admitting: Family Medicine

## 2014-01-17 ENCOUNTER — Ambulatory Visit (INDEPENDENT_AMBULATORY_CARE_PROVIDER_SITE_OTHER): Payer: BC Managed Care – PPO | Admitting: Family Medicine

## 2014-01-17 VITALS — BP 120/72 | HR 81 | Ht 67.0 in | Wt 258.0 lb

## 2014-01-17 DIAGNOSIS — M1710 Unilateral primary osteoarthritis, unspecified knee: Secondary | ICD-10-CM

## 2014-01-17 NOTE — Assessment & Plan Note (Signed)
Synvisc #2 injections were given today. Patient tolerated the procedure very well. Patient will continue with the conservative therapy including home exercises, icing as well as bracing. Patient will come back in one week for her third and final injection in the knees bilaterally.

## 2014-01-17 NOTE — Patient Instructions (Signed)
Good to see you Take either 2 81mg  aspirin daily or a full 325mg  if you want.  Turmeric 500mg  twice daily still will be helpful See you next week.

## 2014-01-17 NOTE — Progress Notes (Signed)
  [name], [name] Subjective:     CC: Bilateral knee pain Synvisc #2 injections  [name] is a 48 y.o. male coming in with complaint of bilateral knee pain. ` Patient does have moderate osteoarthritic changes of the medial compartment of the knees bilaterally. Patient had failed all conservative therapy including corticosteroid injections, icing, home exercises, as well as bracing. Patient was started on Synvisc one week ago. Patient states that he has not made any significant improvement. No new symptoms no.    Past medical history, social, surgical and family history all reviewed in electronic medical record.   Review of Systems: No headache, visual changes, nausea, vomiting, diarrhea, constipation, dizziness, abdominal pain, skin rash, fevers, chills, night sweats, weight loss, swollen lymph nodes, body aches, joint swelling, muscle aches, chest pain, shortness of breath, mood changes.   Objective Blood pressure 120/72, pulse 81, height 5\' 7"  (1.702 m), weight 258 lb (117.028 kg), SpO2 96.00%.  General: No apparent distress alert and oriented x3 mood and affect normal, dressed appropriately.  HEENT: Pupils equal, extraocular movements intact  Respiratory: Patient's speak in full sentences and does not appear short of breath  Cardiovascular: No lower extremity edema, non tender, no erythema  Skin: Warm dry intact with no signs of infection or rash on extremities or on axial skeleton.  Abdomen: Soft nontender  Neuro: Cranial nerves II through XII are intact, neurovascularly intact in all extremities with 2+ DTRs and 2+ pulses.  Lymph: No lymphadenopathy of posterior or anterior cervical chain or axillae bilaterally.  Gait normal with good balance and coordination.  MSK:  Non tender with full range of motion and good stability and symmetric strength and tone of shoulders, elbows,  wrist, hip, and ankles bilaterally.  Knee: Bilateral Normal to inspection with no erythema or effusion or obvious bony abnormalities. Bilateral medial joint line tenderness severe ROM full in flexion and extension and lower leg rotation. Ligaments with solid consistent endpoints including ACL, PCL, LCL, MCL. Mildly positive Mcmurray's, Apley's, and Thessalonian tests. painful patellar compression. Patellar glide with moderate crepitus. Patellar and quadriceps tendons unremarkable. Hamstring and quadriceps strength is normal.   After informed written and verbal consent, patient was seated on exam table. Right knee was prepped with alcohol swab and utilizing anterolateral approach, patient's right knee space was injected with 16 mg/2.5 mL of Synvisc (sodium hyaluronate) in a prefilled syringe was injected easily into the knee through a 22-gauge needle.  After informed written and verbal consent, patient was seated on exam table. Left knee was prepped with alcohol swab and utilizing anterolateral approach, patient's left knee space was injected with 16 mg/2.5 mL of Synvisc (sodium hyaluronate) in a prefilled syringe was injected easily into the knee through a 22-gauge needle.    Impression and Recommendations:     This case required medical decision making of moderate complexity.

## 2014-01-24 ENCOUNTER — Encounter: Payer: Self-pay | Admitting: Family Medicine

## 2014-01-24 ENCOUNTER — Ambulatory Visit (INDEPENDENT_AMBULATORY_CARE_PROVIDER_SITE_OTHER): Payer: BC Managed Care – PPO | Admitting: Family Medicine

## 2014-01-24 VITALS — BP 110/76 | HR 83 | Ht 67.0 in | Wt 258.0 lb

## 2014-01-24 DIAGNOSIS — M1710 Unilateral primary osteoarthritis, unspecified knee: Secondary | ICD-10-CM

## 2014-01-24 NOTE — Assessment & Plan Note (Signed)
Patient has finished his series at this point. Patient will come back in one month. We'll continue with the home exercises, bracing, as well as icing.

## 2014-01-24 NOTE — Progress Notes (Signed)
  [name] [address] [place], [place] [address] Phone: [phone] Subjective:     CC: Bilateral knee pain Synvisc #3 injections  [id number] [name] is a 48 y.o. male coming in with complaint of bilateral knee pain. ` Patient does have moderate osteoarthritic changes of the medial compartment of the knees bilaterally. Patient had failed all conservative therapy including corticosteroid injections, icing, home exercises, as well as bracing. Patient was started on Synvisc one week ago. Patient states after last injection    Past medical history, social, surgical and family history all reviewed in electronic medical record.   Review of Systems: No headache, visual changes, nausea, vomiting, diarrhea, constipation, dizziness, abdominal pain, skin rash, fevers, chills, night sweats, weight loss, swollen lymph nodes, body aches, joint swelling, muscle aches, chest pain, shortness of breath, mood changes.   Objective There were no vitals taken for this visit.  General: No apparent distress alert and oriented x3 mood and affect normal, dressed appropriately.  HEENT: Pupils equal, extraocular movements intact  Respiratory: Patient's speak in full sentences and does not appear short of breath  Cardiovascular: No lower extremity edema, non tender, no erythema  Skin: Warm dry intact with no signs of infection or rash on extremities or on axial skeleton.  Abdomen: Soft nontender  Neuro: Cranial nerves II through XII are intact, neurovascularly intact in all extremities with 2+ DTRs and 2+ pulses.  Lymph: No lymphadenopathy of posterior or anterior cervical chain or axillae bilaterally.  Gait normal with good balance and coordination.  MSK:  Non tender with full range of motion and good stability and symmetric strength and tone of shoulders, elbows, wrist, hip, and ankles bilaterally.  Knee: Bilateral Normal to inspection with no erythema or effusion  or obvious bony abnormalities. Bilateral medial joint line tenderness severe ROM full in flexion and extension and lower leg rotation. Ligaments with solid consistent endpoints including ACL, PCL, LCL, MCL. Mildly positive Mcmurray's, Apley's, and Thessalonian tests. painful patellar compression. Patellar glide with moderate crepitus. Patellar and quadriceps tendons unremarkable. Hamstring and quadriceps strength is normal.   After informed written and verbal consent, patient was seated on exam table. Right knee was prepped with alcohol swab and utilizing anterolateral approach, patient's right knee space was injected with 16 mg/2.5 mL of Synvisc (sodium hyaluronate) in a prefilled syringe was injected easily into the knee through a 22-gauge needle.  After informed written and verbal consent, patient was seated on exam table. Left knee was prepped with alcohol swab and utilizing anterolateral approach, patient's left knee space was injected with 16 mg/2.5 mL of Synvisc (sodium hyaluronate) in a prefilled syringe was injected easily into the knee through a 22-gauge needle.    Impression and Recommendations:     This case required medical decision making of moderate complexity.

## 2014-01-24 NOTE — Patient Instructions (Signed)
Good to see you Continue ice Continue the brace.  See you in 1 month.  Happy Wedding!!  And Honeymoon!

## 2014-02-15 ENCOUNTER — Encounter: Payer: Self-pay | Admitting: Pulmonary Disease

## 2014-02-15 ENCOUNTER — Ambulatory Visit (INDEPENDENT_AMBULATORY_CARE_PROVIDER_SITE_OTHER): Payer: BC Managed Care – PPO | Admitting: Pulmonary Disease

## 2014-02-15 VITALS — BP 110/74 | HR 91 | Temp 97.7°F | Ht 66.0 in | Wt 258.4 lb

## 2014-02-15 DIAGNOSIS — G4733 Obstructive sleep apnea (adult) (pediatric): Secondary | ICD-10-CM

## 2014-02-15 NOTE — Assessment & Plan Note (Signed)
The patient is wearing CPAP compliantly by his download, but needs to increase his total sleep time. He is having some breakthrough apnea because of inadequate pressure, and we will increase his upper range on the automatic setting. He is also having a lot of mask leak, and will arrange for a fitting session at the sleep Center. Finally, I have encouraged him to work aggressively on weight loss.

## 2014-02-15 NOTE — Patient Instructions (Signed)
Continue with cpap Will send an order to [facility] to change your pressure Will setup a mask fitting session at the sleep center. Keep working on weight loss followup with me again in 31mos, but call if you are having issues with your mask

## 2014-02-15 NOTE — Progress Notes (Signed)
   Subjective:    Patient ID: [name], male    DOB: 1965/08/13, 48 y.o.   MRN: [id number]  HPI The patient comes in today for followup of his obstructive sleep apnea.  He was started on CPAP at the last visit, and overall has done fairly well. He he is wearing the device compliantly, but is having some breakthrough apnea related to inadequate pressure and also mask leak. He tells me that nasal pillows were not sealing well, and may have to consider a different mask type. When he is able to wear the CPAP all night, he feels that he is much more rested.   Review of Systems  Constitutional: Negative for fever and unexpected weight change.  HENT: Negative for congestion, dental problem, ear pain, nosebleeds, postnasal drip, rhinorrhea, sinus pressure, sneezing, sore throat and trouble swallowing.   Eyes: Negative for redness and itching.  Respiratory: Negative for cough, chest tightness, shortness of breath and wheezing.   Cardiovascular: Negative for palpitations and leg swelling.  Gastrointestinal: Negative for nausea and vomiting.  Genitourinary: Negative for dysuria.  Musculoskeletal: Negative for joint swelling.  Skin: Negative for rash.  Neurological: Negative for headaches.  Hematological: Does not bruise/bleed easily.  Psychiatric/Behavioral: Negative for dysphoric mood. The patient is not nervous/anxious.        Objective:   Physical Exam Obese male in no acute distress Nose without purulence or discharge noted No skin breakdown or pressure necrosis from the CPAP mask Neck without lymphadenopathy or thyromegaly Lower extremities with no edema, no cyanosis Alert and oriented, moves all 4 extremities.       Assessment & Plan:

## 2014-02-20 ENCOUNTER — Ambulatory Visit (HOSPITAL_BASED_OUTPATIENT_CLINIC_OR_DEPARTMENT_OTHER): Payer: BC Managed Care – PPO | Attending: Pulmonary Disease | Admitting: Radiology

## 2014-02-20 DIAGNOSIS — G4733 Obstructive sleep apnea (adult) (pediatric): Secondary | ICD-10-CM

## 2014-02-21 ENCOUNTER — Ambulatory Visit (INDEPENDENT_AMBULATORY_CARE_PROVIDER_SITE_OTHER): Payer: BC Managed Care – PPO | Admitting: Internal Medicine

## 2014-02-21 ENCOUNTER — Encounter: Payer: Self-pay | Admitting: Internal Medicine

## 2014-02-21 VITALS — BP 130/90 | HR 88 | Temp 97.8°F | Wt 259.5 lb

## 2014-02-21 DIAGNOSIS — R635 Abnormal weight gain: Secondary | ICD-10-CM

## 2014-02-21 DIAGNOSIS — M170 Bilateral primary osteoarthritis of knee: Secondary | ICD-10-CM

## 2014-02-21 DIAGNOSIS — R0789 Other chest pain: Secondary | ICD-10-CM

## 2014-02-21 DIAGNOSIS — M1710 Unilateral primary osteoarthritis, unspecified knee: Secondary | ICD-10-CM

## 2014-02-21 DIAGNOSIS — N529 Male erectile dysfunction, unspecified: Secondary | ICD-10-CM

## 2014-02-21 MED ORDER — SILDENAFIL CITRATE 20 MG PO TABS: ORAL_TABLET | ORAL | Status: DC

## 2014-02-21 NOTE — Progress Notes (Signed)
   Subjective:    Patient ID: [name], male    DOB: 1965/08/13, 48 y.o.   MRN: [id number]  HPI He continues to have atypical chest pain which is nonexertional. He does want to be reevaluated by Cardiologist.  He has degenerative joint disease of the knees and is followed by Dr. Tamala Julian. He's received steroid injections as well as probable collagen injections.The DJD has curbed his exercise.  He states that intermittently he will have the appearance of a knot behind the right knee.  He continues to be an issue both in getting ad keeping erection . Sildenafil had been of benefit in the past.   Review of Systems With decreased CVE ; weight has climbed. TSH therapeutic X 2 in 2015.     Objective:   Physical Exam   Pertinent or positive findings include: Head shaven; he has a mustache and beard. Abdomen is protuberant without organomegaly or tenderness. Pes planus present.  General appearance :adequately nourished; in no distress.  As per CDC Guidelines ,Epic documents severe obesity as being present .  Eyes: No conjunctival inflammation or scleral icterus is present.  Heart:  Normal rate and regular rhythm. S1 and S2 normal without gallop, murmur, click, rub or other extra sounds     Lungs:Chest clear to auscultation; no wheezes, rhonchi,rales ,or rubs present.No increased work of breathing.   Abdomen: bowel sounds normal, soft and non-tender without masses, organomegaly or hernias noted.  No guarding or rebound.  Skin:Warm & dry.  Intact without suspicious lesions or rashes ; no jaundice or tenting  Lymphatic: No lymphadenopathy is noted about the head, neck, axilla.   Strength , tone WNL            Assessment & Plan:  #1 ED  #2 DJD knees #3 atypical chest pain #4 weight gain See orders & AVS

## 2014-02-21 NOTE — Progress Notes (Signed)
Pre visit review using our clinic review tool, if applicable. No additional management support is needed unless otherwise documented below in the visit note. 

## 2014-02-21 NOTE — Patient Instructions (Addendum)
  Alternatively cheaper therapeutically equivalent option for Viagra is available from  [facility] at [id number].  Follow a low carb nutrition program such as  The New Sugar Busters as closely as possible to prevent Diabetes progression & address weight gain.  White carbohydrates (potatoes, rice, bread, and pasta) cause a high spike of the sugar level which stays elevated for a significant period of time (called sugar"load").  For example a  baked potato has a cup of sugar and a  french fry  2 teaspoons of sugar.  More complex carbs such as yams, wild  rice, whole grained bread &  wheat pasta have been much lower spike and persistent load of sugar than the white carbs.

## 2014-02-22 ENCOUNTER — Other Ambulatory Visit: Payer: Self-pay | Admitting: Internal Medicine

## 2014-02-22 ENCOUNTER — Telehealth: Payer: Self-pay

## 2014-02-22 DIAGNOSIS — R7309 Other abnormal glucose: Secondary | ICD-10-CM

## 2014-02-22 NOTE — Telephone Encounter (Signed)
Message copied by Shelly Coss on Wed Feb 22, 2014  8:20 AM ------      Message from: Hendricks Limes      Created: Wed Feb 22, 2014  5:38 AM       I reviewed his chart after his visit; his last sugar was mildly elevated. Please ask him  to come in for  A1c (790.29). Ordered entered       ------

## 2014-02-22 NOTE — Telephone Encounter (Signed)
I left a message on patient's home voicemail to come in for labs and to call our office if he has concerns.

## 2014-02-24 ENCOUNTER — Telehealth: Payer: Self-pay | Admitting: Pulmonary Disease

## 2014-02-24 ENCOUNTER — Encounter: Payer: Self-pay | Admitting: Family Medicine

## 2014-02-24 ENCOUNTER — Ambulatory Visit (INDEPENDENT_AMBULATORY_CARE_PROVIDER_SITE_OTHER): Payer: BC Managed Care – PPO | Admitting: Family Medicine

## 2014-02-24 VITALS — BP 122/82 | HR 83 | Ht 66.0 in | Wt 259.0 lb

## 2014-02-24 DIAGNOSIS — M1710 Unilateral primary osteoarthritis, unspecified knee: Secondary | ICD-10-CM

## 2014-02-24 NOTE — Progress Notes (Signed)
  [name] [place], [place] [address] Phone: [phone] Subjective:     CC: Bilateral knee pain after Synvisc injections  [id number] [name] is a 48 y.o. male coming in with complaint of bilateral knee pain. ` Patient does have underlying moderate osteophytic changes the knees as well as and meniscal tears. Patient states though that after the injection she is approximately 70% better. Patient has been able to start increasing her activity as well as running and has noticed some improvement with him losing weight. Patient still has some mild discomfort with walking or if he twists. Denies any radiation down the leg or any numbness or weakness.  Patient denies any swelling.    Past medical history, social, surgical and family history all reviewed in electronic medical record.   Review of Systems: No headache, visual changes, nausea, vomiting, diarrhea, constipation, dizziness, abdominal pain, skin rash, fevers, chills, night sweats, weight loss, swollen lymph nodes, body aches, joint swelling, muscle aches, chest pain, shortness of breath, mood changes.   Objective Blood pressure 122/82, pulse 83, height 5\' 6"  (1.676 m), weight 259 lb (117.482 kg), SpO2 95.00%.  General: No apparent distress alert and oriented x3 mood and affect normal, dressed appropriately.  HEENT: Pupils equal, extraocular movements intact  Respiratory: Patient's speak in full sentences and does not appear short of breath  Cardiovascular: No lower extremity edema, non tender, no erythema  Skin: Warm dry intact with no signs of infection or rash on extremities or on axial skeleton.  Abdomen: Soft nontender  Neuro: Cranial nerves II through XII are intact, neurovascularly intact in all extremities with 2+ DTRs and 2+ pulses.  Lymph: No lymphadenopathy of posterior or anterior cervical chain or axillae bilaterally.  Gait normal with good balance and coordination.    MSK:  Non tender with full range of motion and good stability and symmetric strength and tone of shoulders, elbows, wrist, hip, and ankles bilaterally.  Knee: Bilateral Normal to inspection with no erythema or effusion or obvious bony abnormalities. Bilateral medial joint line tenderness severe ROM full in flexion and extension and lower leg rotation. Ligaments with solid consistent endpoints including ACL, PCL, LCL, MCL. Negative Mcmurray's, Apley's, and Thessalonian tests. painful patellar compression. Patellar glide with moderate crepitus. Patellar and quadriceps tendons unremarkable. Hamstring and quadriceps strength is normal.    Impression and Recommendations:     This case required medical decision making of moderate complexity.

## 2014-02-24 NOTE — Telephone Encounter (Signed)
Error.[name] ° °

## 2014-02-24 NOTE — Telephone Encounter (Signed)
Called [facility], spoke Daneil Dan was advised they did change pt pressure setting via autoview.  Called pt and made aware. Nothing further needed

## 2014-02-24 NOTE — Assessment & Plan Note (Signed)
Discussed with patient at great length. Discussed an icing protocol as well. Discuss continuing to proceeding. Discuss weight loss can be beneficial. We discussed the patient can follow up on an as-needed basis. We can always restart corticosteroid injections and we can repeat Synvisc injection in 6 months if necessary.

## 2014-02-24 NOTE — Patient Instructions (Signed)
Good to see you Ice is your friend Continue the brace.  See you when you need me.

## 2014-03-10 ENCOUNTER — Other Ambulatory Visit: Payer: BC Managed Care – PPO

## 2014-03-10 ENCOUNTER — Other Ambulatory Visit (INDEPENDENT_AMBULATORY_CARE_PROVIDER_SITE_OTHER): Payer: BC Managed Care – PPO

## 2014-03-10 DIAGNOSIS — R7309 Other abnormal glucose: Secondary | ICD-10-CM

## 2014-03-10 LAB — HEMOGLOBIN A1C: Hgb A1c MFr Bld: 6.1 % (ref 4.6–6.5)

## 2014-03-17 ENCOUNTER — Ambulatory Visit: Payer: BC Managed Care – PPO | Admitting: Family Medicine

## 2014-04-04 ENCOUNTER — Ambulatory Visit: Payer: BC Managed Care – PPO | Admitting: Internal Medicine

## 2014-05-10 ENCOUNTER — Ambulatory Visit: Payer: BC Managed Care – PPO | Admitting: Family Medicine

## 2014-05-10 ENCOUNTER — Ambulatory Visit (INDEPENDENT_AMBULATORY_CARE_PROVIDER_SITE_OTHER): Payer: BC Managed Care – PPO | Admitting: Family Medicine

## 2014-05-10 ENCOUNTER — Encounter: Payer: Self-pay | Admitting: Family Medicine

## 2014-05-10 VITALS — BP 128/86 | HR 85 | Wt 267.0 lb

## 2014-05-10 DIAGNOSIS — M1710 Unilateral primary osteoarthritis, unspecified knee: Secondary | ICD-10-CM

## 2014-05-10 NOTE — Progress Notes (Signed)
  [name] [place], [place] [address] Phone: [phone] Subjective:     CC: Bilateral knee pain after Synvisc injections  [id number] [name] is a 48 y.o. male coming in with complaint of bilateral knee pain. ` Patient does have underlying moderate osteophytic changes the knees as well as and meniscal tears.  Patient was seen previously and did respond very well to Synvisc injections. Patient had also started running and being much more active and was losing weight. Patient was seen in 10 weeks ago. Patient states unfortunately he is having bilateral knee pain. States that it is worse with any type of activity at this time. Patient continues to wear the braces but unfortunately they do not seem to be helping as much. Patient feels that the pain is near as bad as it was his initial visit. Once again patient has had a history of a meniscectomy of the left knee. Patient still included do daily activities but finds it difficult. Patient has had x-rays showing mild osteophytic changes.    Past medical history, social, surgical and family history all reviewed in electronic medical record.   Review of Systems: No headache, visual changes, nausea, vomiting, diarrhea, constipation, dizziness, abdominal pain, skin rash, fevers, chills, night sweats, weight loss, swollen lymph nodes, body aches, joint swelling, muscle aches, chest pain, shortness of breath, mood changes.   Objective Blood pressure 128/86, pulse 85, weight 267 lb (121.11 kg), SpO2 98 %.  General: No apparent distress alert and oriented x3 mood and affect normal, dressed appropriately.  HEENT: Pupils equal, extraocular movements intact  Respiratory: Patient's speak in full sentences and does not appear short of breath  Cardiovascular: No lower extremity edema, non tender, no erythema  Skin: Warm dry intact with no signs of infection or rash on extremities or on axial skeleton.    Abdomen: Soft nontender  Neuro: Cranial nerves II through XII are intact, neurovascularly intact in all extremities with 2+ DTRs and 2+ pulses.  Lymph: No lymphadenopathy of posterior or anterior cervical chain or axillae bilaterally.  Gait normal with good balance and coordination.  MSK:  Non tender with full range of motion and good stability and symmetric strength and tone of shoulders, elbows, wrist, hip, and ankles bilaterally.  Knee: Bilateral Normal to inspection with no erythema or effusion or obvious bony abnormalities. Bilateral medial joint line tenderness severe and wose then previous.  ROM full in flexion and extension and lower leg rotation. Ligaments with solid consistent endpoints including ACL, PCL, LCL, MCL. Negative Mcmurray's, Apley's, and Thessalonian tests. painful patellar compression. Patellar glide with moderate crepitus. Patellar and quadriceps tendons unremarkable. Hamstring and quadriceps strength is normal.   After informed written and verbal consent, patient was seated on exam table. Right knee was prepped with alcohol swab and utilizing anterolateral approach, patient's right knee space was injected with 4:1  marcaine 0.5%: Kenalog 40mg /dL. Patient tolerated the procedure well without immediate complications.  After informed written and verbal consent, patient was seated on exam table. Left knee was prepped with alcohol swab and utilizing anterolateral approach, patient's left knee space was injected with 4:1  marcaine 0.5%: Kenalog 40mg /dL. Patient tolerated the procedure well without immediate complications.   Impression and Recommendations:     This case required medical decision making of moderate complexity.

## 2014-05-10 NOTE — Assessment & Plan Note (Signed)
Patient was given bilateral injections today. Patient had mild improvement immediately. I do think the patient continues to have this difficulty. Patient will continue with conservative therapy. Patient would like to avoid any type of surgery at this point. Due to patient's flares of the knee joint as well as patient's past medical history significant for a deep venous thrombosis to think that further workup may be necessary. Then follow-up we'll talk about possible laboratory workup to rule out any autoimmune diseases or any other metabolic pathology that could be contributing. I would consider an ANA, ESR, thyroid function, parathyroid hormone as well as ruling out such things as rheumatoid arthritis, gout sarcoidosis, lupus, and potentially other autoimmune diseases. Further imaging may be necessary as well.  Spent greater than 25 minutes with patient face-to-face and had greater than 50% of counseling including as described above in assessment and plan.

## 2014-05-10 NOTE — Patient Instructions (Addendum)
Good to see you.  Continue the ice.  Continue the braces when you need it.  Try new medicine. Up to 3 times daily. If you like we can send in prescription Turmeric 500mg  twice daily can help Call me in 2 weeks if not better we will consider MRI. If MRI I want to see you 1-2 days after.

## 2014-06-23 ENCOUNTER — Ambulatory Visit: Payer: Self-pay | Admitting: Family Medicine

## 2014-06-26 ENCOUNTER — Other Ambulatory Visit: Payer: Self-pay | Admitting: Internal Medicine

## 2014-06-26 NOTE — Telephone Encounter (Signed)
OK , R X1

## 2014-06-27 ENCOUNTER — Ambulatory Visit: Payer: Self-pay | Admitting: Family Medicine

## 2014-06-27 ENCOUNTER — Telehealth: Payer: Self-pay | Admitting: Family Medicine

## 2014-06-27 NOTE — Telephone Encounter (Signed)
No showed for knee pain.  Please advise.

## 2014-06-28 NOTE — Telephone Encounter (Signed)
Noted  

## 2014-07-04 ENCOUNTER — Ambulatory Visit: Payer: Self-pay | Admitting: Family Medicine

## 2014-07-17 ENCOUNTER — Encounter: Payer: Self-pay | Admitting: Family Medicine

## 2014-07-17 ENCOUNTER — Ambulatory Visit (INDEPENDENT_AMBULATORY_CARE_PROVIDER_SITE_OTHER): Payer: BLUE CROSS/BLUE SHIELD | Admitting: Family Medicine

## 2014-07-17 ENCOUNTER — Other Ambulatory Visit: Payer: Self-pay | Admitting: *Deleted

## 2014-07-17 VITALS — BP 128/88 | HR 74 | Ht 66.0 in

## 2014-07-17 DIAGNOSIS — M1712 Unilateral primary osteoarthritis, left knee: Secondary | ICD-10-CM

## 2014-07-17 DIAGNOSIS — M25562 Pain in left knee: Secondary | ICD-10-CM

## 2014-07-17 NOTE — Patient Instructions (Addendum)
We need to consider an MRI, this has been a long time. Ice still and continue the braces if they help.  Try the pennsaid twice daily See me again in 10-14 days and we can start synvisc again if needed and we will discuss the MRI.

## 2014-07-17 NOTE — Progress Notes (Signed)
  [name] [place], [place] [address] Phone: [phone] Subjective:     CC: Bilateral knee pain left side worsening  IHK:[id number] [name] is a 49 y.o. male coming in with complaint of bilateral knee pain. ` Patient does have underlying moderate osteophytic changes the knees as well as and meniscal tears.  Patient was seen previously and did respond very well to Synvisc injections. Patient on 2 months ago was having increasing pain again. Patient was given repeat steroid injections. Patient states that his left knee has gotten significantly worse. Patient states from time to time he is unable to even walk on the knee. She feels like the right side is doing better. Patient was wearing the brace but unfortunately the Velcro is no longer working. Patient continues to try to do the icing.    Past medical history, social, surgical and family history all reviewed in electronic medical record.   Review of Systems: No headache, visual changes, nausea, vomiting, diarrhea, constipation, dizziness, abdominal pain, skin rash, fevers, chills, night sweats, weight loss, swollen lymph nodes, body aches, joint swelling, muscle aches, chest pain, shortness of breath, mood changes.   Objective Blood pressure 128/88, pulse 74, height 5\' 6"  (1.676 m), SpO2 98 %.  General: No apparent distress alert and oriented x3 mood and affect normal, dressed appropriately.  HEENT: Pupils equal, extraocular movements intact  Respiratory: Patient's speak in full sentences and does not appear short of breath  Cardiovascular: No lower extremity edema, non tender, no erythema  Skin: Warm dry intact with no signs of infection or rash on extremities or on axial skeleton.  Abdomen: Soft nontender  Neuro: Cranial nerves II through XII are intact, neurovascularly intact in all extremities with 2+ DTRs and 2+ pulses.  Lymph: No lymphadenopathy of posterior or anterior cervical  chain or axillae bilaterally.  Gait normal with good balance and coordination.  MSK:  Non tender with full range of motion and good stability and symmetric strength and tone of shoulders, elbows, wrist, hip, and ankles bilaterally.  Knee: Left Normal to inspection with no erythema or effusion or obvious bony abnormalities. Bilateral medial joint line tenderness severe and wose then previous. Patient is having significantly worse pain on the left knee today. ROM full in flexion and extension and lower leg rotation. Ligaments with solid consistent endpoints including ACL, PCL, LCL, MCL. Positive Mcmurray's, Apley's, and Thessalonian tests left side. painful patellar compression. Patellar glide with moderate crepitus. Patellar and quadriceps tendons unremarkable. Hamstring and quadriceps strength is normal.   MSK US performed of: Left knee This study was ordered, performed, and interpreted by Charlann Boxer D.O.  Knee: All structures visualized. Anterior medial meniscus has what appears to be a chronic tear but significant displacement. Mild hypoechoic changes surrounding it making it possibly an acute on chronic tear. Patellar Tendon unremarkable on long and transverse views without effusion. No abnormality of prepatellar bursa. LCL and MCL unremarkable on long and transverse views. No abnormality of origin of medial or lateral head of the gastrocnemius.  IMPRESSION:  Left medial meniscal degenerative changes pressure more acute tear     Impression and Recommendations:     This case required medical decision making of moderate complexity.

## 2014-07-17 NOTE — Assessment & Plan Note (Signed)
Patient has mild osteophytic changes and seems to be more moderate on ultrasound today. I do think that there is chronic changes of the meniscus as well a could be contribute to his discomfort. Patient has had this pain for quite some time and I do think repeat imaging with an MRI could be very beneficial. We'll see how this does. Patient was given a different brace today. Patient follow-up with me in 10-14 days. If continuing have pain we'll start Synvisc supplementation. Hopefully will have the MRI results and we will go over this to. Patient does have a large enough tear we'll consider possible referral for surgical intervention.

## 2014-07-17 NOTE — Progress Notes (Signed)
Pre visit review using our clinic review tool, if applicable. No additional management support is needed unless otherwise documented below in the visit note. 

## 2014-07-18 ENCOUNTER — Encounter: Payer: Self-pay | Admitting: Internal Medicine

## 2014-07-18 ENCOUNTER — Ambulatory Visit: Payer: Self-pay | Admitting: Family Medicine

## 2014-07-18 ENCOUNTER — Ambulatory Visit (INDEPENDENT_AMBULATORY_CARE_PROVIDER_SITE_OTHER): Payer: BLUE CROSS/BLUE SHIELD | Admitting: Internal Medicine

## 2014-07-18 VITALS — BP 138/90 | HR 83 | Temp 97.9°F | Ht 66.0 in | Wt 264.8 lb

## 2014-07-18 DIAGNOSIS — R739 Hyperglycemia, unspecified: Secondary | ICD-10-CM

## 2014-07-18 DIAGNOSIS — E785 Hyperlipidemia, unspecified: Secondary | ICD-10-CM

## 2014-07-18 DIAGNOSIS — R0789 Other chest pain: Secondary | ICD-10-CM

## 2014-07-18 DIAGNOSIS — Z0000 Encounter for general adult medical examination without abnormal findings: Secondary | ICD-10-CM

## 2014-07-18 DIAGNOSIS — Z0189 Encounter for other specified special examinations: Secondary | ICD-10-CM

## 2014-07-18 MED ORDER — RANITIDINE HCL 150 MG PO TABS: 150.0000 mg | ORAL_TABLET | Freq: Two times a day (BID) | ORAL | Status: DC

## 2014-07-18 NOTE — Progress Notes (Signed)
Pre visit review using our clinic review tool, if applicable. No additional management support is needed unless otherwise documented below in the visit note. 

## 2014-07-18 NOTE — Progress Notes (Signed)
Subjective:    Patient ID: [name], male    DOB: 1965/12/06, 49 y.o.   MRN: [id number]  HPI  He is here for a physical;acute issues described.   He has been monitoring his blood pressure at home; it averages 125-130/80. He does restrict salt. He does not restrict red meat or fried foods.  He has started an exercise program of biking for 30 minutes 3 times a week as well as weights. He does not have exertional chest pain with the exercise. He does have swelling in the left knee.  Has 2 brothers who have had heart attacks at or before the age of 61. Advanced cholesterol testing suggests that his LDL goal is less than 120. Cath negative 2007.  Review of Systems Exertional chest pain, palpitations, tachycardia, exertional dyspnea, paroxysmal nocturnal dyspnea, claudication or edema are absent. He has atypical chest pain as sharp , substernal pain only @ rest. Unexplained weight loss,  melena, rectal bleeding, or persistently small caliber stools are denied. Occasional dyspepsia ;dysphagia described on average  twice every 2 weeks. He has occasional RUQ sharp pain @ rest lasting 3-5 minutes twice a month. ED evaluated by Dr Nesi;pump recommended.     Objective:   Physical Exam Gen.: Adequately nourished in appearance. Alert, appropriate and cooperative throughout exam. As per CDC Guidelines ,Epic documents obesity as being present . Appears younger than stated age  Head: Normocephalic without obvious abnormalities;  Head shaven  Eyes: No corneal or conjunctival inflammation noted. Pupils equal round reactive to light and accommodation. Extraocular motion intact.  Ears: External  ear exam reveals no significant lesions or deformities. Canals clear .TMs normal. Hearing is grossly normal bilaterally. Nose: External nasal exam reveals no deformity or inflammation. Nasal mucosa are pink and moist. No lesions or exudates noted.   Mouth: Oral mucosa and oropharynx reveal no lesions or  exudates. Teeth in good repair. Neck: No deformities, masses, or tenderness noted. Range of motion & Thyroid normal. Lungs: Normal respiratory effort; chest expands symmetrically. Lungs are clear to auscultation without rales, wheezes, or increased work of breathing. Heart: Normal rate and rhythm. Normal S1 and S2. No gallop, click, or rub. S4 w/o murmur. Abdomen: Protuberant.Bowel sounds normal; abdomen soft and nontender. No masses, organomegaly or hernias noted. Genitalia: has seen Dr Janice Norrie, Urologist                            Musculoskeletal/extremities: No deformity or scoliosis noted of  the thoracic or lumbar spine.  No clubbing, cyanosis, edema, or significant extremity  deformity noted.  Range of motion normal . Tone & strength normal. Hand joints normal Fingernail  health good. Crepitus of knees ;valgus knee changessuggested Able to lie down & sit up w/o help.  Negative SLR bilaterally Vascular: Carotid, radial artery, dorsalis pedis and  posterior tibial pulses are full and equal. No bruits present. Neurologic: Alert and oriented x3. Deep tendon reflexes symmetrical and normal.  Gait :slight limp on L Skin: Intact without suspicious lesions or rashes. Lymph: No cervical, axillary lymphadenopathy present. Psych: Mood and affect are normal. Normally interactive  Assessment & Plan:  #1 comprehensive physical exam; no acute findings #2 probable symptomatic GERD #3 atypical chest pain ; EKG :improved T voltage generally vs 2015  Plan: see Orders & AVS  Recommendations

## 2014-07-18 NOTE — Patient Instructions (Signed)
  Your next office appointment will be determined based upon review of your pending labs . Those instructions will be transmitted to you through My Chart Critical values will be called. Followup as needed for any active or acute issue. Please report any significant change in your symptoms.  Reflux of gastric acid may be asymptomatic as this may occur mainly during sleep.The triggers for reflux  include stress; the "aspirin family" ; alcohol; peppermint; and caffeine (coffee, tea, cola, and chocolate). The aspirin family would include aspirin and the nonsteroidal agents such as ibuprofen &  Naproxen. Tylenol would not cause reflux. If having symptoms ; food & drink should be avoided for @ least 2 hours before going to bed.

## 2014-07-29 ENCOUNTER — Ambulatory Visit
Admission: RE | Admit: 2014-07-29 | Discharge: 2014-07-29 | Disposition: A | Discharge status: Home / Self Care | Payer: BLUE CROSS/BLUE SHIELD | Source: Ambulatory Visit | Attending: Family Medicine | Admitting: Family Medicine

## 2014-07-29 DIAGNOSIS — M25562 Pain in left knee: Secondary | ICD-10-CM

## 2014-07-31 ENCOUNTER — Ambulatory Visit (INDEPENDENT_AMBULATORY_CARE_PROVIDER_SITE_OTHER)
Admission: RE | Admit: 2014-07-31 | Discharge: 2014-07-31 | Disposition: A | Discharge status: Home / Self Care | Payer: BLUE CROSS/BLUE SHIELD | Source: Ambulatory Visit | Attending: Internal Medicine | Admitting: Internal Medicine

## 2014-07-31 ENCOUNTER — Ambulatory Visit (INDEPENDENT_AMBULATORY_CARE_PROVIDER_SITE_OTHER): Payer: BLUE CROSS/BLUE SHIELD | Admitting: Internal Medicine

## 2014-07-31 ENCOUNTER — Encounter: Payer: Self-pay | Admitting: Internal Medicine

## 2014-07-31 ENCOUNTER — Ambulatory Visit (INDEPENDENT_AMBULATORY_CARE_PROVIDER_SITE_OTHER): Payer: BLUE CROSS/BLUE SHIELD | Admitting: Family Medicine

## 2014-07-31 ENCOUNTER — Other Ambulatory Visit: Payer: Self-pay | Admitting: Internal Medicine

## 2014-07-31 ENCOUNTER — Other Ambulatory Visit (INDEPENDENT_AMBULATORY_CARE_PROVIDER_SITE_OTHER): Payer: BLUE CROSS/BLUE SHIELD

## 2014-07-31 ENCOUNTER — Encounter: Payer: Self-pay | Admitting: Family Medicine

## 2014-07-31 VITALS — BP 142/96 | HR 82 | Ht 66.0 in | Wt 263.0 lb

## 2014-07-31 VITALS — BP 142/96 | HR 82 | Temp 97.9°F | Ht 66.0 in | Wt 263.0 lb

## 2014-07-31 DIAGNOSIS — Z0189 Encounter for other specified special examinations: Secondary | ICD-10-CM

## 2014-07-31 DIAGNOSIS — S83249A Other tear of medial meniscus, current injury, unspecified knee, initial encounter: Secondary | ICD-10-CM | POA: Insufficient documentation

## 2014-07-31 DIAGNOSIS — S83242D Other tear of medial meniscus, current injury, left knee, subsequent encounter: Secondary | ICD-10-CM

## 2014-07-31 DIAGNOSIS — R0789 Other chest pain: Secondary | ICD-10-CM

## 2014-07-31 DIAGNOSIS — Z0000 Encounter for general adult medical examination without abnormal findings: Secondary | ICD-10-CM

## 2014-07-31 LAB — TSH: TSH: 1 u[IU]/mL (ref 0.35–4.50)

## 2014-07-31 LAB — BASIC METABOLIC PANEL
BUN: 14 mg/dL (ref 6–23)
CHLORIDE: 106 meq/L (ref 96–112)
CO2: 28 meq/L (ref 19–32)
CREATININE: 0.96 mg/dL (ref 0.40–1.50)
Calcium: 9.5 mg/dL (ref 8.4–10.5)
GFR: 107.14 mL/min (ref >60.00)
GLUCOSE: 88 mg/dL (ref 70–99)
Potassium: 4.3 mEq/L (ref 3.5–5.1)
Sodium: 141 mEq/L (ref 135–145)

## 2014-07-31 LAB — CBC WITH DIFFERENTIAL/PLATELET
Basophils Absolute: 0.1 10*3/uL (ref 0.0–0.1)
Basophils Relative: 0.7 % (ref 0.0–3.0)
EOS ABS: 0.3 10*3/uL (ref 0.0–0.7)
Eosinophils Relative: 2.5 % (ref 0.0–5.0)
HCT: 46 % (ref 39.0–52.0)
HEMOGLOBIN: 15.4 g/dL (ref 13.0–17.0)
LYMPHS PCT: 27.1 % (ref 12.0–46.0)
Lymphs Abs: 2.8 10*3/uL (ref 0.7–4.0)
MCHC: 33.5 g/dL (ref 30.0–36.0)
MCV: 86 fl (ref 78.0–100.0)
MONO ABS: 0.8 10*3/uL (ref 0.1–1.0)
MONOS PCT: 8.1 % (ref 3.0–12.0)
NEUTROS ABS: 6.4 10*3/uL (ref 1.4–7.7)
Neutrophils Relative %: 61.6 % (ref 43.0–77.0)
Platelets: 248 10*3/uL (ref 150.0–400.0)
RBC: 5.34 Mil/uL (ref 4.22–5.81)
RDW: 13.8 % (ref 11.5–15.5)
WBC: 10.3 10*3/uL (ref 4.0–10.5)

## 2014-07-31 LAB — HEPATIC FUNCTION PANEL
ALK PHOS: 49 U/L (ref 39–117)
ALT: 26 U/L (ref 0–53)
AST: 17 U/L (ref 0–37)
Albumin: 4.1 g/dL (ref 3.5–5.2)
Bilirubin, Direct: 0.1 mg/dL (ref 0.0–0.3)
TOTAL PROTEIN: 7.8 g/dL (ref 6.0–8.3)
Total Bilirubin: 0.5 mg/dL (ref 0.2–1.2)

## 2014-07-31 LAB — HEMOGLOBIN A1C: Hgb A1c MFr Bld: 6.3 % (ref 4.6–6.5)

## 2014-07-31 NOTE — Progress Notes (Signed)
   Subjective:    Patient ID: [name], male    DOB: [birth year], 49 y.o.   MRN: [id number]  HPI  He awoke 2 weeks ago with pain in the left lateral inferior thoracic area. No associated injury prior to symptoms. He noted redness over this area without ecchymosis, vesicle formation, or pustules.  Also he had an intermittent lower back pain as well which was intermittent & not persistent. He has not taken anything for the pain  Blood pressure at the pharmacy recently was 160/80. He does not monitor his blood pressure at home.  Review of Systems He denies fever, chills, sweats. Epistaxis, hemoptysis, hematuria, melena, or rectal bleeding denied. No unexplained weight loss, significant dyspepsia,dysphagia, or abdominal pain.  There is no abnormal bruising , bleeding, or difficulty stopping bleeding with injury.    Objective:   Physical Exam Pertinent or positive findings include: General appearance :adequately nourished; in no distress.  Eyes: No conjunctival inflammation or scleral icterus is present.  Oral exam:  Lips and gums are healthy appearing.There is no oropharyngeal erythema or exudate noted. Dental hygiene is good.  Heart:  Normal rate and regular rhythm. S1 and S2 normal without gallop, murmur, click, rub or other extra sounds    Lungs:Chest clear to auscultation; no wheezes, rhonchi,rales ,or rubs present.No increased work of breathing.  He exhibits marked tenderness to palpation over the left lateral thoracic rib cage.  Abdomen: bowel sounds normal, soft and non-tender without masses, organomegaly or hernias noted.  No guarding or rebound. No flank tenderness to percussion.  Vascular : all pulses equal ; no bruits present.  Skin:Warm & dry.  Intact without suspicious lesions or rashes ; no tenting or jaundice   Lymphatic: No lymphadenopathy is noted about the head, neck, axilla, or inguinal areas.   Neuro: Strength, tone  normal.    The left knee is in a  soft brace.      Assessment & Plan:  #1 chest wall pain  Plan: See orders and after visit summary

## 2014-07-31 NOTE — Progress Notes (Signed)
Pre visit review using our clinic review tool, if applicable. No additional management support is needed unless otherwise documented below in the visit note. 

## 2014-07-31 NOTE — Patient Instructions (Addendum)
Good to see you Ice is your friend Try new brace We will try the injections again.  Physical therapy will be calling you.  See me again next week.

## 2014-07-31 NOTE — Assessment & Plan Note (Addendum)
Discussed with patient at great length. Because the patient's findings on MRI do think the patient will only be pain-free with surgical repair. Patient unfortunately though did have a deep venous thrombosis and pulmonary embolism after last surgery so he would like to avoid it as much as possible. Patient has elected to try the viscous supplementation to see if he can have some improvement with it. Discuss with him that there is a possibility due to the underlying arthritis that this would be helpful but not overly encouraged by the medical literature. Patient though has failed all other conservative therapy but we will readdress patient's pain with actually repeat formal physical therapy. Patient and will come back and see me again in 1 week for second injection.

## 2014-07-31 NOTE — Progress Notes (Signed)
  [name] [place], [place] [address] Phone: [phone] Subjective:     CC: Bilateral knee pain left side worsening  [id number] [name] is a 49 y.o. male coming in with complaint of bilateral knee pain. ` Patient does have underlying moderate osteophytic changes the knees as well as and meniscal tears.  Patient was seen previously and did respond very well to Synvisc injections.  Patient over the course of time was having worsening pain especially his left knee. Patient did have an MRI because it was given in the locking sensation. Patient's MRI results did show that patient did have a new posterior medial meniscal tear. There is significant degenerative changes in this area as well. Patient is here to discuss different treatment options. Past medical history, social, surgical and family history all reviewed in electronic medical record.   Review of Systems: No headache, visual changes, nausea, vomiting, diarrhea, constipation, dizziness, abdominal pain, skin rash, fevers, chills, night sweats, weight loss, swollen lymph nodes, body aches, joint swelling, muscle aches, chest pain, shortness of breath, mood changes.   Objective Blood pressure 142/96, pulse 82, height 5\' 6"  (1.676 m), weight 263 lb (119.296 kg), SpO2 94 %.  General: No apparent distress alert and oriented x3 mood and affect normal, dressed appropriately.  HEENT: Pupils equal, extraocular movements intact  Respiratory: Patient's speak in full sentences and does not appear short of breath  Cardiovascular: No lower extremity edema, non tender, no erythema  Skin: Warm dry intact with no signs of infection or rash on extremities or on axial skeleton.  Abdomen: Soft nontender  Neuro: Cranial nerves II through XII are intact, neurovascularly intact in all extremities with 2+ DTRs and 2+ pulses.  Lymph: No lymphadenopathy of posterior or anterior cervical chain or axillae  bilaterally.  Gait normal with good balance and coordination.  MSK:  Non tender with full range of motion and good stability and symmetric strength and tone of shoulders, elbows, wrist, hip, and ankles bilaterally.  Knee: Left Normal to inspection with no erythema or effusion or obvious bony abnormalities. Bilateral medial joint line tenderness severe and wose then previous. Patient is having significantly worse pain on the left knee today. ROM full in flexion and extension and lower leg rotation. Ligaments with solid consistent endpoints including ACL, PCL, LCL, MCL. Positive Mcmurray's, Apley's, and Thessalonian tests left side. painful patellar compression. Patellar glide with moderate crepitus. Patellar and quadriceps tendons unremarkable. Hamstring and quadriceps strength is normal.    After informed written and verbal consent, patient was seated on exam table. Left knee was prepped with alcohol swab and utilizing anterolateral approach, patient's left knee space was injected with 16 mg/2.5 mL of Synvisc (sodium hyaluronate) in a prefilled syringe was injected easily into the knee through a 22-gauge needle. Patient tolerated the procedure well without immediate complications.      Impression and Recommendations:     This case required medical decision making of moderate complexity.

## 2014-07-31 NOTE — Patient Instructions (Addendum)
Use an anti-inflammatory cream such as Aspercreme or Zostrix cream twice a day to the affected area as needed. In lieu of this warm moist compresses or  hot water bottle can be used. Do not apply ice .   Your next office appointment will be determined based upon review of your pending labs & x-rays. Those instructions will be transmitted to you through My Chart   Critical values will be called. Followup as needed for any active or acute issue. Please report any significant change in your symptoms.  Minimal Blood Pressure Goal= AVERAGE < 140/90;  Ideal is an AVERAGE < 135/85. This AVERAGE should be calculated from @ least 5-7 BP readings taken @ different times of day on different days of week. You should not respond to isolated BP readings , but rather the AVERAGE for that week .Please bring your  blood pressure cuff to office visits to verify that it is reliable.It  can also be checked against the blood pressure device at the pharmacy. Finger or wrist cuffs are not dependable; an arm cuff is.

## 2014-08-02 LAB — NMR LIPOPROFILE WITH LIPIDS
CHOLESTEROL, TOTAL: 181 mg/dL (ref 100–199)
HDL Particle Number: 28.8 umol/L — ABNORMAL LOW (ref >=30.5)
HDL SIZE: 8.4 nm — AB (ref >=9.2)
HDL-C: 45 mg/dL (ref >39)
LDL CALC: 123 mg/dL — AB (ref 0–99)
LDL Particle Number: 1374 nmol/L — ABNORMAL HIGH (ref <1000)
LDL Size: 21.4 nm (ref >=20.8)
LP-IR SCORE: 42 (ref <=45)
Large HDL-P: 2.2 umol/L — ABNORMAL LOW (ref >=4.8)
Large VLDL-P: 1.3 nmol/L (ref <=2.7)
SMALL LDL PARTICLE NUMBER: 343 nmol/L (ref <=527)
TRIGLYCERIDES: 67 mg/dL (ref 0–149)
VLDL SIZE: 39.9 nm (ref <=46.6)

## 2014-08-07 ENCOUNTER — Ambulatory Visit (INDEPENDENT_AMBULATORY_CARE_PROVIDER_SITE_OTHER): Payer: BLUE CROSS/BLUE SHIELD | Admitting: Family Medicine

## 2014-08-07 ENCOUNTER — Encounter: Payer: Self-pay | Admitting: Family Medicine

## 2014-08-07 VITALS — BP 130/86 | HR 83 | Ht 66.0 in | Wt 263.0 lb

## 2014-08-07 DIAGNOSIS — M1711 Unilateral primary osteoarthritis, right knee: Secondary | ICD-10-CM

## 2014-08-07 DIAGNOSIS — M1712 Unilateral primary osteoarthritis, left knee: Secondary | ICD-10-CM

## 2014-08-07 NOTE — Assessment & Plan Note (Signed)
Patient was given an injection today. Patient tolerated the procedure very well. We will have patient come back for the second of the series of 3 injections.

## 2014-08-07 NOTE — Assessment & Plan Note (Signed)
Patient started the Synvisc injections had 2 out of the 3 injections. Patient come back next week for further treatment.

## 2014-08-07 NOTE — Patient Instructions (Addendum)
Good to see you man as always See  You next week We will keep trucking along. Keep doing what you are doing. Will try to have the other needles next week :)

## 2014-08-07 NOTE — Progress Notes (Signed)
[name] [place], [place] [address] Phone: [phone] Subjective:     CC: Bilateral knee pain  [name] is a 49 y.o. male coming in with complaint of bilateral knee pain. ` Patient does have underlying moderate osteophytic changes the knees as well as and meniscal tears.  Patient was seen previously and did respond very well to Synvisc injections.  Patient over the course of time was having worsening pain especially his left knee. Patient did have an MRI because it was given in the locking sensation. Patient's MRI results did show that patient did have a new posterior medial meniscal tear. There is significant degenerative changes in this area as well.  Patient elected to start Synvisc. Patient did have the first one and unfortunately had a flare for the first 24 hours. Patient states overall though the knee has improved somewhat. Patient is actually having more pain on the right side and would like to start the Synvisc series on this side.  Past medical history, social, surgical and family history all reviewed in electronic medical record.   Review of Systems: No headache, visual changes, nausea, vomiting, diarrhea, constipation, dizziness, abdominal pain, skin rash, fevers, chills, night sweats, weight loss, swollen lymph nodes, body aches, joint swelling, muscle aches, chest pain, shortness of breath, mood changes.   Objective Blood pressure 130/86, pulse 83, height 5\' 6"  (1.676 m), weight 263 lb (119.296 kg), SpO2 94 %.  General: No apparent distress alert and oriented x3 mood and affect normal, dressed appropriately.  HEENT: Pupils equal, extraocular movements intact  Respiratory: Patient's speak in full sentences and does not appear short of breath  Cardiovascular: No lower extremity edema, non tender, no erythema  Skin: Warm dry intact with no signs of infection or rash on extremities or on axial skeleton.    Abdomen: Soft nontender  Neuro: Cranial nerves II through XII are intact, neurovascularly intact in all extremities with 2+ DTRs and 2+ pulses.  Lymph: No lymphadenopathy of posterior or anterior cervical chain or axillae bilaterally.  Gait normal with good balance and coordination.  MSK:  Non tender with full range of motion and good stability and symmetric strength and tone of shoulders, elbows, wrist, hip, and ankles bilaterally.  Knee: Left Normal to inspection with no erythema or effusion or obvious bony abnormalities. Bilateral medial joint line tenderness severe and wose then previous. Patient is having significantly worse pain on the left knee today. ROM full in flexion and extension and lower leg rotation. Ligaments with solid consistent endpoints including ACL, PCL, LCL, MCL. Positive Mcmurray's, Apley's, and Thessalonian tests left side. painful patellar compression. Patellar glide with moderate crepitus. Patellar and quadriceps tendons unremarkable. Hamstring and quadriceps strength is normal.  Right knee has pain on the medial joint line  After informed written and verbal consent, patient was seated on exam table. Left knee was prepped with alcohol swab and utilizing anterolateral approach, patient's left knee space was injected with 16 mg/2.5 mL of Synvisc (sodium hyaluronate) in a prefilled syringe was injected easily into the knee through a 22-gauge needle. Patient tolerated the procedure well without immediate complications.  After informed written and verbal consent, patient was seated on exam table. Right knee was prepped with alcohol swab and utilizing anterolateral approach, patient's right knee space was injected with 16 mg/2.5 mL of Synvisc (sodium hyaluronate) in a prefilled syringe was injected easily into the knee through a 22-gauge needle.. Patient tolerated the procedure well  without immediate complications.    Impression and Recommendations:     This case  required medical decision making of moderate complexity.

## 2014-08-07 NOTE — Progress Notes (Signed)
Pre visit review using our clinic review tool, if applicable. No additional management support is needed unless otherwise documented below in the visit note. 

## 2014-08-14 ENCOUNTER — Encounter: Payer: Self-pay | Admitting: Family Medicine

## 2014-08-14 ENCOUNTER — Ambulatory Visit (INDEPENDENT_AMBULATORY_CARE_PROVIDER_SITE_OTHER): Payer: BLUE CROSS/BLUE SHIELD | Admitting: Family Medicine

## 2014-08-14 VITALS — BP 130/80 | HR 93 | Ht 66.0 in | Wt 263.0 lb

## 2014-08-14 DIAGNOSIS — S83242D Other tear of medial meniscus, current injury, left knee, subsequent encounter: Secondary | ICD-10-CM

## 2014-08-14 NOTE — Progress Notes (Signed)
Pre visit review using our clinic review tool, if applicable. No additional management support is needed unless otherwise documented below in the visit note. 

## 2014-08-14 NOTE — Assessment & Plan Note (Signed)
Discussed with patient at great length. Due to patient's not make any considerable difference we will try another injection today. Patient's no if he does not respond I do think surgical intervention is necessary. Patient would be referred for hopefully an arthroscopic procedure even though patient does have arthritis. Patient is fairly young to have a true total knee replacement. Patient is more willing to discuss surgical intervention at this time and will call when he feels that it is appropriate.

## 2014-08-14 NOTE — Patient Instructions (Signed)
Good to see you  Call me when you want me to send you to Dr. [facility] [address] [phone] Otherwise see me when you need me.

## 2014-08-14 NOTE — Progress Notes (Signed)
  [name], [name] Phone: [phone] Subjective:     CC: Bilateral knee pain  TKW:[id number] [name] is a 49 y.o. male coming in with complaint of bilateral knee pain. ` Patient does have underlying moderate osteophytic changes the knees as well as and meniscal tears.  Patient was seen previously and did respond very well to Synvisc injections.  Patient over the course of time was having worsening pain especially his left knee. Patient did have an MRI because it was given in the locking sensation. Patient's MRI results did show that patient did have a new posterior medial meniscal tear. There is significant degenerative changes in this area as well.  Patient elected to start Synvisc. Patient is here for third injection into his left knee. Patient was given an injection in his right knee. States that the right knee seems to be almost pain-free. Patient knows unfortunate continuing to have significant discomfort on the left knee.  Past medical history, social, surgical and family history all reviewed in electronic medical record.   Review of Systems: No headache, visual changes, nausea, vomiting, diarrhea, constipation, dizziness, abdominal pain, skin rash, fevers, chills, night sweats, weight loss, swollen lymph nodes, body aches, joint swelling, muscle aches, chest pain, shortness of breath, mood changes.   Objective Blood pressure 130/80, pulse 93, height 5\' 6"  (1.676 m), weight 263 lb (119.296 kg), SpO2 97 %.  General: No apparent distress alert and oriented x3 mood and affect normal, dressed appropriately.  HEENT: Pupils equal, extraocular movements intact  Respiratory: Patient's speak in full sentences and does not appear short of breath  Cardiovascular: No lower extremity edema, non tender, no erythema  Skin: Warm dry intact with no signs of infection or rash on extremities or on axial skeleton.  Abdomen: Soft  nontender  Neuro: Cranial nerves II through XII are intact, neurovascularly intact in all extremities with 2+ DTRs and 2+ pulses.  Lymph: No lymphadenopathy of posterior or anterior cervical chain or axillae bilaterally.  Gait normal with good balance and coordination.  MSK:  Non tender with full range of motion and good stability and symmetric strength and tone of shoulders, elbows, wrist, hip, and ankles bilaterally.  Knee: Left Normal to inspection with no erythema or effusion or obvious bony abnormalities. Bilateral medial joint line tenderness severe and wose then previous. Patient is having significantly worse pain on the left knee today. ROM full in flexion and extension and lower leg rotation. Ligaments with solid consistent endpoints including ACL, PCL, LCL, MCL. Positive Mcmurray's, Apley's, and Thessalonian tests left side. painful patellar compression. Patellar glide with moderate crepitus. Patellar and quadriceps tendons unremarkable. Hamstring and quadriceps strength is normal.  Right knee has pain on the medial joint line  After informed written and verbal consent, patient was seated on exam table. Left knee was prepped with alcohol swab and utilizing anterolateral approach, patient's left knee space was injected with 16 mg/2.5 mL of Synvisc (sodium hyaluronate) in a prefilled syringe was injected easily into the knee through a 22-gauge needle. Patient tolerated the procedure well without immediate complications.     Impression and Recommendations:     This case required medical decision making of moderate complexity.

## 2014-08-16 ENCOUNTER — Ambulatory Visit: Payer: BC Managed Care – PPO | Admitting: Pulmonary Disease

## 2014-08-17 ENCOUNTER — Telehealth: Payer: Self-pay | Admitting: Family Medicine

## 2014-08-17 DIAGNOSIS — S83242D Other tear of medial meniscus, current injury, left knee, subsequent encounter: Secondary | ICD-10-CM

## 2014-08-17 NOTE — Telephone Encounter (Signed)
Pt want to speak to the assistant concern about the surgery that he is having, pt was told to call when he make his decision.

## 2014-08-18 NOTE — Telephone Encounter (Signed)
Spoke to pt, he would like to go ahead with the referral to see Dr. Berenice Primas. Referral entered.

## 2014-08-18 NOTE — Telephone Encounter (Signed)
lmovm for pt to return call.  

## 2014-08-21 ENCOUNTER — Ambulatory Visit: Payer: BLUE CROSS/BLUE SHIELD | Admitting: Family Medicine

## 2014-08-29 ENCOUNTER — Other Ambulatory Visit: Payer: Self-pay | Admitting: Internal Medicine

## 2014-08-29 NOTE — Telephone Encounter (Signed)
OK X1;R X 1

## 2014-09-18 ENCOUNTER — Telehealth: Payer: Self-pay | Admitting: General Practice

## 2014-09-18 ENCOUNTER — Other Ambulatory Visit: Payer: Self-pay | Admitting: Orthopedic Surgery

## 2014-09-18 ENCOUNTER — Telehealth: Payer: Self-pay

## 2014-09-18 ENCOUNTER — Encounter (HOSPITAL_BASED_OUTPATIENT_CLINIC_OR_DEPARTMENT_OTHER): Payer: Self-pay | Admitting: *Deleted

## 2014-09-18 NOTE — Telephone Encounter (Signed)
Sounds good unless Ortho plans post op Xarelto

## 2014-09-18 NOTE — Telephone Encounter (Signed)
Received your response on correspondence that was faxed stating perioperative coverage is recommended due to heterozygous factor V luden mutation

## 2014-09-18 NOTE — Telephone Encounter (Signed)
No ; PTE was post Orthopedic procedure with sedentary status post op

## 2014-09-18 NOTE — Telephone Encounter (Signed)
Take Xarelto, 15 mg twice a day post op knee surgery, per Dr. Linna Darner.  Begin therapy 1 day post op.    Patient can pick up samples of Xarelto at the [facility] office.  Follow up with Dr. Linna Darner before coming off of therapy.

## 2014-09-18 NOTE — Telephone Encounter (Signed)
Instructions given to [facility] @ [employer].

## 2014-09-18 NOTE — Telephone Encounter (Addendum)
Per Dr Linna Darner patient to be given Xarelto 15 mg Bid.  I let Villa Herb know these were placed at the front desk. Lot # [id number] Exp 09-17 2 bottles #14 in each

## 2014-09-18 NOTE — Telephone Encounter (Signed)
LMOVM for patient to call Villa Herb, RN @ [phone].

## 2014-09-18 NOTE — Telephone Encounter (Signed)
Phone call from Manuela Schwartz from Dr Dorna Leitz office at [facility]. Patient has a left knee scope coming up and has a history of dvt/pe. Any anticoagulant you would like patient to be on pre or post op? Please advise

## 2014-09-18 NOTE — Telephone Encounter (Signed)
Perioperative anticoagulation indicated because of the past history of saddle pulmonary embolus. I'll see if this can be arranged through Villa Herb ,RN

## 2014-09-19 ENCOUNTER — Encounter (HOSPITAL_BASED_OUTPATIENT_CLINIC_OR_DEPARTMENT_OTHER): Payer: Self-pay | Admitting: *Deleted

## 2014-09-19 NOTE — Progress Notes (Signed)
No htn meds-hx clots dr hopper put him on zaralto post op To bring cpap-and told he has to use it post op

## 2014-09-20 ENCOUNTER — Ambulatory Visit (HOSPITAL_BASED_OUTPATIENT_CLINIC_OR_DEPARTMENT_OTHER): Payer: BLUE CROSS/BLUE SHIELD | Admitting: Anesthesiology

## 2014-09-20 ENCOUNTER — Encounter (HOSPITAL_BASED_OUTPATIENT_CLINIC_OR_DEPARTMENT_OTHER)
Admission: RE | Disposition: A | Discharge status: Home / Self Care | Payer: Self-pay | Source: Ambulatory Visit | Attending: Orthopedic Surgery

## 2014-09-20 ENCOUNTER — Encounter (HOSPITAL_BASED_OUTPATIENT_CLINIC_OR_DEPARTMENT_OTHER): Payer: Self-pay

## 2014-09-20 ENCOUNTER — Ambulatory Visit (HOSPITAL_BASED_OUTPATIENT_CLINIC_OR_DEPARTMENT_OTHER)
Admission: RE | Admit: 2014-09-20 | Discharge: 2014-09-20 | Disposition: A | Discharge status: Home / Self Care | Payer: BLUE CROSS/BLUE SHIELD | Source: Ambulatory Visit | Attending: Orthopedic Surgery | Admitting: Orthopedic Surgery

## 2014-09-20 DIAGNOSIS — M2242 Chondromalacia patellae, left knee: Secondary | ICD-10-CM | POA: Diagnosis not present

## 2014-09-20 DIAGNOSIS — Z6841 Body Mass Index (BMI) 40.0 and over, adult: Secondary | ICD-10-CM | POA: Insufficient documentation

## 2014-09-20 DIAGNOSIS — I739 Peripheral vascular disease, unspecified: Secondary | ICD-10-CM | POA: Diagnosis not present

## 2014-09-20 DIAGNOSIS — Z9861 Coronary angioplasty status: Secondary | ICD-10-CM | POA: Insufficient documentation

## 2014-09-20 DIAGNOSIS — Z86718 Personal history of other venous thrombosis and embolism: Secondary | ICD-10-CM | POA: Insufficient documentation

## 2014-09-20 DIAGNOSIS — I10 Essential (primary) hypertension: Secondary | ICD-10-CM | POA: Diagnosis not present

## 2014-09-20 DIAGNOSIS — G4733 Obstructive sleep apnea (adult) (pediatric): Secondary | ICD-10-CM | POA: Insufficient documentation

## 2014-09-20 DIAGNOSIS — M23232 Derangement of other medial meniscus due to old tear or injury, left knee: Secondary | ICD-10-CM | POA: Diagnosis present

## 2014-09-20 DIAGNOSIS — Z7982 Long term (current) use of aspirin: Secondary | ICD-10-CM | POA: Diagnosis not present

## 2014-09-20 DIAGNOSIS — Z86711 Personal history of pulmonary embolism: Secondary | ICD-10-CM | POA: Diagnosis not present

## 2014-09-20 DIAGNOSIS — M6752 Plica syndrome, left knee: Secondary | ICD-10-CM | POA: Insufficient documentation

## 2014-09-20 HISTORY — DX: Sleep apnea, unspecified: G47.30

## 2014-09-20 HISTORY — PX: KNEE ARTHROSCOPY WITH MEDIAL MENISECTOMY: SHX5651

## 2014-09-20 LAB — POCT HEMOGLOBIN-HEMACUE: Hemoglobin: 14.5 g/dL (ref 13.0–17.0)

## 2014-09-20 SURGERY — ARTHROSCOPY, KNEE, WITH MEDIAL MENISCECTOMY
Anesthesia: General | Site: Knee | Laterality: Left

## 2014-09-20 MED ORDER — CEFAZOLIN SODIUM-DEXTROSE 2-3 GM-% IV SOLR
2.0000 g | INTRAVENOUS | Status: AC
  Administered 2014-09-20: 2 g via INTRAVENOUS

## 2014-09-20 MED ORDER — DEXAMETHASONE SODIUM PHOSPHATE 4 MG/ML IJ SOLN
INTRAMUSCULAR | Status: DC | PRN
  Administered 2014-09-20: 10 mg via INTRAVENOUS

## 2014-09-20 MED ORDER — PROPOFOL 10 MG/ML IV BOLUS
INTRAVENOUS | Status: DC | PRN
  Administered 2014-09-20: 200 mg via INTRAVENOUS

## 2014-09-20 MED ORDER — MIDAZOLAM HCL 2 MG/2ML IJ SOLN
INTRAMUSCULAR | Status: AC
  Filled 2014-09-20: qty 2

## 2014-09-20 MED ORDER — OXYCODONE HCL 5 MG PO TABS
5.0000 mg | ORAL_TABLET | Freq: Once | ORAL | Status: AC | PRN
  Administered 2014-09-20: 5 mg via ORAL

## 2014-09-20 MED ORDER — RIVAROXABAN 10 MG PO TABS: 15.0000 mg | ORAL_TABLET | Freq: Every day | ORAL | Status: DC

## 2014-09-20 MED ORDER — CHLORHEXIDINE GLUCONATE 4 % EX LIQD: 60.0000 mL | Freq: Once | CUTANEOUS | Status: DC

## 2014-09-20 MED ORDER — OXYCODONE-ACETAMINOPHEN 5-325 MG PO TABS: 1.0000 | ORAL_TABLET | Freq: Four times a day (QID) | ORAL | Status: DC | PRN

## 2014-09-20 MED ORDER — OXYCODONE HCL 5 MG PO TABS
ORAL_TABLET | ORAL | Status: AC
  Filled 2014-09-20: qty 1

## 2014-09-20 MED ORDER — BUPIVACAINE HCL (PF) 0.5 % IJ SOLN
INTRAMUSCULAR | Status: DC | PRN
  Administered 2014-09-20: 20 mL

## 2014-09-20 MED ORDER — MIDAZOLAM HCL 2 MG/2ML IJ SOLN
1.0000 mg | INTRAMUSCULAR | Status: DC | PRN
  Administered 2014-09-20: 2 mg via INTRAVENOUS

## 2014-09-20 MED ORDER — HYDROMORPHONE HCL 1 MG/ML IJ SOLN
0.2500 mg | INTRAMUSCULAR | Status: DC | PRN
  Administered 2014-09-20 (×2): 0.5 mg via INTRAVENOUS

## 2014-09-20 MED ORDER — HYDROMORPHONE HCL 1 MG/ML IJ SOLN
INTRAMUSCULAR | Status: AC
Start: 2014-09-20 — End: 2014-09-20
  Filled 2014-09-20: qty 1

## 2014-09-20 MED ORDER — PROMETHAZINE HCL 25 MG/ML IJ SOLN: 6.2500 mg | INTRAMUSCULAR | Status: DC | PRN

## 2014-09-20 MED ORDER — OXYCODONE HCL 5 MG/5ML PO SOLN: 5.0000 mg | Freq: Once | ORAL | Status: AC | PRN

## 2014-09-20 MED ORDER — LIDOCAINE HCL (CARDIAC) 20 MG/ML IV SOLN
INTRAVENOUS | Status: DC | PRN
  Administered 2014-09-20: 60 mg via INTRAVENOUS

## 2014-09-20 MED ORDER — LACTATED RINGERS IV SOLN
INTRAVENOUS | Status: DC
  Administered 2014-09-20 (×2): via INTRAVENOUS

## 2014-09-20 MED ORDER — FENTANYL CITRATE (PF) 100 MCG/2ML IJ SOLN
INTRAMUSCULAR | Status: DC | PRN
  Administered 2014-09-20 (×2): 50 ug via INTRAVENOUS
  Administered 2014-09-20: 100 ug via INTRAVENOUS

## 2014-09-20 MED ORDER — SODIUM CHLORIDE 0.9 % IR SOLN
Status: DC | PRN
  Administered 2014-09-20: 3500 mL

## 2014-09-20 MED ORDER — EPINEPHRINE HCL 1 MG/ML IJ SOLN
INTRAMUSCULAR | Status: AC
  Filled 2014-09-20: qty 1

## 2014-09-20 MED ORDER — BUPIVACAINE HCL (PF) 0.5 % IJ SOLN
INTRAMUSCULAR | Status: AC
  Filled 2014-09-20: qty 90

## 2014-09-20 MED ORDER — FENTANYL CITRATE (PF) 100 MCG/2ML IJ SOLN
INTRAMUSCULAR | Status: AC
  Filled 2014-09-20: qty 4

## 2014-09-20 MED ORDER — EPINEPHRINE HCL 1 MG/ML IJ SOLN
INTRAMUSCULAR | Status: DC | PRN
  Administered 2014-09-20: 1 mg

## 2014-09-20 MED ORDER — BUPIVACAINE-EPINEPHRINE (PF) 0.5% -1:200000 IJ SOLN
INTRAMUSCULAR | Status: AC
  Filled 2014-09-20: qty 90

## 2014-09-20 MED ORDER — ONDANSETRON HCL 4 MG/2ML IJ SOLN
INTRAMUSCULAR | Status: DC | PRN
  Administered 2014-09-20: 4 mg via INTRAVENOUS

## 2014-09-20 MED ORDER — CEFAZOLIN SODIUM-DEXTROSE 2-3 GM-% IV SOLR
INTRAVENOUS | Status: AC
  Filled 2014-09-20: qty 50

## 2014-09-20 SURGICAL SUPPLY — 38 items
BANDAGE ELASTIC 6 VELCRO ST LF (GAUZE/BANDAGES/DRESSINGS) ×3 IMPLANT
BLADE 4.2CUDA (BLADE) IMPLANT
BLADE GREAT WHITE 4.2 (BLADE) ×3 IMPLANT
CUTTER MENISCUS  4.2MM (BLADE)
CUTTER MENISCUS 4.2MM (BLADE) IMPLANT
DRAPE ARTHROSCOPY W/POUCH 114 (DRAPES) ×3 IMPLANT
DRSG EMULSION OIL 3X3 NADH (GAUZE/BANDAGES/DRESSINGS) ×3 IMPLANT
DURAPREP 26ML APPLICATOR (WOUND CARE) ×3 IMPLANT
ELECT MENISCUS 165MM 90D (ELECTRODE) IMPLANT
ELECT REM PT RETURN 9FT ADLT (ELECTROSURGICAL)
ELECTRODE REM PT RTRN 9FT ADLT (ELECTROSURGICAL) IMPLANT
GAUZE SPONGE 4X4 12PLY STRL (GAUZE/BANDAGES/DRESSINGS) ×3 IMPLANT
GLOVE BIO SURGEON STRL SZ 6.5 (GLOVE) ×2 IMPLANT
GLOVE BIO SURGEON STRL SZ7 (GLOVE) ×2 IMPLANT
GLOVE BIOGEL PI IND STRL 8 (GLOVE) ×4 IMPLANT
GLOVE BIOGEL PI INDICATOR 8 (GLOVE) ×2
GLOVE ECLIPSE 7.5 STRL STRAW (GLOVE) ×6 IMPLANT
GOWN STRL REUS W/ TWL LRG LVL3 (GOWN DISPOSABLE) ×2 IMPLANT
GOWN STRL REUS W/ TWL XL LVL3 (GOWN DISPOSABLE) ×2 IMPLANT
GOWN STRL REUS W/TWL LRG LVL3 (GOWN DISPOSABLE) ×3
GOWN STRL REUS W/TWL XL LVL3 (GOWN DISPOSABLE) ×6 IMPLANT
HOLDER KNEE FOAM BLUE (MISCELLANEOUS) ×3 IMPLANT
KNEE WRAP E Z 3 GEL PACK (MISCELLANEOUS) ×3 IMPLANT
MANIFOLD NEPTUNE II (INSTRUMENTS) ×2 IMPLANT
NDL SAFETY ECLIPSE 18X1.5 (NEEDLE) IMPLANT
NEEDLE HYPO 18GX1.5 SHARP (NEEDLE)
PACK ARTHROSCOPY DSU (CUSTOM PROCEDURE TRAY) ×3 IMPLANT
PACK BASIN DAY SURGERY FS (CUSTOM PROCEDURE TRAY) ×3 IMPLANT
PAD CAST 4YDX4 CTTN HI CHSV (CAST SUPPLIES) ×2 IMPLANT
PADDING CAST COTTON 4X4 STRL (CAST SUPPLIES) ×3
PENCIL BUTTON HOLSTER BLD 10FT (ELECTRODE) IMPLANT
SET ARTHROSCOPY TUBING (MISCELLANEOUS) ×3
SET ARTHROSCOPY TUBING LN (MISCELLANEOUS) ×2 IMPLANT
SUT ETHILON 4 0 PS 2 18 (SUTURE) IMPLANT
SYR 5ML LL (SYRINGE) ×3 IMPLANT
TOWEL OR 17X24 6PK STRL BLUE (TOWEL DISPOSABLE) ×3 IMPLANT
TOWEL OR NON WOVEN STRL DISP B (DISPOSABLE) ×3 IMPLANT
WATER STERILE IRR 1000ML POUR (IV SOLUTION) ×3 IMPLANT

## 2014-09-20 NOTE — Discharge Instructions (Signed)
POST-OP KNEE ARTHROSCOPY INSTRUCTIONS  °Dr. John Graves/Jim Bethune PA-C ° °Pain °You will be expected to have a moderate amount of pain in the affected knee for approximately two weeks. However, the first two days will be the most severe pain. A prescription has been provided to take as needed for the pain. The pain can be reduced by applying ice packs to the knee for the first 1-2 weeks post surgery. Also, keeping the leg elevated on pillows will help alleviate the pain. If you develop any acute pain or swelling in your calf muscle, please call the doctor. ° °Activity °It is preferred that you stay at bed rest for approximately 24 hours. However, you may go to the bathroom with help. Weight bearing as tolerated. You may begin the knee exercises the day of surgery. Discontinue crutches as the knee pain resolves. ° °Dressing °Keep the dressing dry. If the ace bandage should wrinkle or roll up, this can be rewrapped to prevent ridges in the bandage. You may remove all dressings in 48 hours,  apply bandaids to each wound. You may shower on the 4th day after surgery but no tub bath. ° °Symptoms to report to your doctor °Extreme pain °Extreme swelling °Temperature above 101 degrees °Change in the feeling, color, or movement of your toes °Redness, heat, or swelling at your incision ° °Exercise °If is preferred that as soon as possible you try to do a straight leg raise without bending the knee and concentrate on bringing the heel of your foot off the bed up to approximately 45 degrees and hold for the count of 10 seconds. Repeat this at least 10 times three or four times per day. Additional exercises are provided below. ° °You are encouraged to bend the knee as tolerated. ° °Follow-Up °Call to schedule a follow-up appointment in 5-7 days. Our office # is [phone]. ° °POST-OP EXERCISES ° °Short Arc Quads ° °1. Lie on back with legs straight. Place towel roll under thigh, just above knee. °2. Tighten thigh muscles to  straighten knee and lift heel off bed. °3. Hold for slow count of five, then lower. °4. Do three sets of ten ° ° ° °Straight Leg Raises ° °1. Lie on back with operative leg straight and other leg bent. °2. Keeping operative leg completely straight, slowly lift operative leg so foot is 5 inches off bed. °3. Hold for slow count of five, then lower. °4. Do three sets of ten. ° ° ° °DO BOTH EXERCISES 2 TIMES A DAY ° °Ankle Pumps ° °Work/move the operative ankle and foot up and down 10 times every hour while awake. ° ° °Post Anesthesia Home Care Instructions ° °Activity: °Get plenty of rest for the remainder of the day. A responsible adult should stay with you for 24 hours following the procedure.  °For the next 24 hours, DO NOT: °-Drive a car °-Operate machinery °-Drink alcoholic beverages °-Take any medication unless instructed by your physician °-Make any legal decisions or sign important papers. ° °Meals: °Start with liquid foods such as gelatin or soup. Progress to regular foods as tolerated. Avoid greasy, spicy, heavy foods. If nausea and/or vomiting occur, drink only clear liquids until the nausea and/or vomiting subsides. Call your physician if vomiting continues. ° °Special Instructions/Symptoms: °Your throat may feel dry or sore from the anesthesia or the breathing tube placed in your throat during surgery. If this causes discomfort, gargle with warm salt water. The discomfort should disappear within 24 hours. ° °If you   had a scopolamine patch placed behind your ear for the management of post- operative nausea and/or vomiting:  1. The medication in the patch is effective for 72 hours, after which it should be removed.  Wrap patch in a tissue and discard in the trash. Wash hands thoroughly with soap and water. 2. You may remove the patch earlier than 72 hours if you experience unpleasant side effects which may include dry mouth, dizziness or visual disturbances. 3. Avoid touching the patch. Wash your hands  with soap and water after contact with the patch.    Regional Anesthesia Blocks  1. Numbness or the inability to move the "blocked" extremity may last from 3-48 hours after placement. The length of time depends on the medication injected and your individual response to the medication. If the numbness is not going away after 48 hours, call your surgeon.  2. The extremity that is blocked will need to be protected until the numbness is gone and the  Strength has returned. Because you cannot feel it, you will need to take extra care to avoid injury. Because it may be weak, you may have difficulty moving it or using it. You may not know what position it is in without looking at it while the block is in effect.  3. For blocks in the legs and feet, returning to weight bearing and walking needs to be done carefully. You will need to wait until the numbness is entirely gone and the strength has returned. You should be able to move your leg and foot normally before you try and bear weight or walk. You will need someone to be with you when you first try to ensure you do not fall and possibly risk injury.  4. Bruising and tenderness at the needle site are common side effects and will resolve in a few days.  5. Persistent numbness or new problems with movement should be communicated to the surgeon or the [place] [phone] [place] [phone]).

## 2014-09-20 NOTE — Anesthesia Preprocedure Evaluation (Addendum)
Anesthesia Evaluation  Patient identified by MRN, date of birth, ID band Patient awake    Reviewed: Allergy & Precautions, NPO status , Patient's Chart, lab work & pertinent test results  Airway Mallampati: III  TM Distance: >3 FB Neck ROM: Full    Dental  (+) Teeth Intact, Dental Advisory Given   Pulmonary sleep apnea and Continuous Positive Airway Pressure Ventilation ,  breath sounds clear to auscultation        Cardiovascular hypertension, + Peripheral Vascular Disease (Hx PE) + dysrhythmias Rhythm:Regular Rate:Normal     Neuro/Psych  Headaches,    GI/Hepatic negative GI ROS, Neg liver ROS,   Endo/Other  Morbid obesity  Renal/GU negative Renal ROS     Musculoskeletal  (+) Arthritis -,   Abdominal   Peds  Hematology Factor V leiden   Anesthesia Other Findings   Reproductive/Obstetrics                            Anesthesia Physical Anesthesia Plan  ASA: III  Anesthesia Plan: General   Post-op Pain Management:    Induction: Intravenous  Airway Management Planned: LMA  Additional Equipment:   Intra-op Plan:   Post-operative Plan: Extubation in OR  Informed Consent: I have reviewed the patients History and Physical, chart, labs and discussed the procedure including the risks, benefits and alternatives for the proposed anesthesia with the patient or authorized representative who has indicated his/her understanding and acceptance.     Plan Discussed with: CRNA  Anesthesia Plan Comments:        Anesthesia Quick Evaluation

## 2014-09-20 NOTE — H&P (Signed)
PREOPERATIVE H&P  Chief Complaint: l knee pain  HPI: [name] is a 49 y.o. male who presents for evaluation of l knee pain. It has been present for greater than 3 months and has been worsening. He has failed conservative measures. Pain is rated as moderate.  Past Medical History  Diagnosis Date  . Cluster headache   . Rotator cuff arthropathy 2007    right  . DVT of lower extremity (deep venous thrombosis) 6/26-28/2013    Xarelto   . Saddle pulmonary embolus 6/26-28/2013    post orthopedic surgery  . Irritability and anger     PMH of  . Sleep apnea 5/15    moderate OSA-has cpap  . HTN (hypertension)     no meds   Past Surgical History  Procedure Laterality Date  . Wisdom tooth extraction    . Cardiac catheterization  2007    Left ventricular dysfunction; normal coronaries  . Upper gastrointestinal endoscopy  2007  . Arthroscopic knee surgery  10/09/2011    Dr Ninfa Linden  . Rotator cuff repair  2007    right   History   Social History  . Marital Status: Single    Spouse Name: N/A  . Number of Children: N/A  . Years of Education: N/A   Social History Main Topics  . Smoking status: Never Smoker   . Smokeless tobacco: Never Used  . Alcohol Use: Yes     Comment: occ  . Drug Use: No  . Sexual Activity: Not on file   Other Topics Concern  . None   Social History Narrative   Family History  Problem Relation Age of Onset  . Cancer Mother     Mesothelioma   . Diabetes Brother   . Hypertension Brother   . Stroke Brother 5  . Diabetes Maternal Aunt   . Diabetes Maternal Grandmother   . Heart disease Maternal Grandmother   . Hypertension Maternal Grandmother   . Stroke Maternal Grandmother     in 77s  . Stroke Maternal Grandfather     > 55  . Migraines Brother   . Deep vein thrombosis Maternal Grandmother   . Heart attack Brother 79  . Heart attack Brother 75   No Known Allergies Prior to Admission medications   Medication Sig Start Date End Date  Taking? Authorizing Provider  aspirin EC 81 MG tablet Take 81 mg by mouth daily.   Yes Historical Provider, MD  Multiple Vitamins-Minerals (MULTIVITAMIN WITH MINERALS) tablet Take 1 tablet by mouth daily.   Yes Historical Provider, MD  fluticasone (FLONASE) 50 MCG/ACT nasal spray Place 2 sprays into both nostrils daily as needed for allergies or rhinitis.    Historical Provider, MD  rivaroxaban (XARELTO) 10 MG TABS tablet Take 15 mg by mouth daily. Start day of surgery    Historical Provider, MD     Positive ROS: none  All other systems have been reviewed and were otherwise negative with the exception of those mentioned in the HPI and as above.  Physical Exam: Filed Vitals:   09/20/14 0902  BP: 138/91  Pulse: 80  Temp: 97.9 F (36.6 C)  Resp: 22    General: Alert, no acute distress Cardiovascular: No pedal edema Respiratory: No cyanosis, no use of accessory musculature GI: No organomegaly, abdomen is soft and non-tender Skin: No lesions in the area of chief complaint Neurologic: Sensation intact distally Psychiatric: Patient is competent for consent with normal mood and affect Lymphatic: No axillary or cervical  lymphadenopathy  MUSCULOSKELETAL: l knee +ttp over med aspect + Mcmurray -instability MRI +mmt and djd  Assessment/Plan: left knee medial meniscus tear Plan for Procedure(s): ARTHROSCOPY KNEE  The risks benefits and alternatives were discussed with the patient including but not limited to the risks of nonoperative treatment, versus surgical intervention including infection, bleeding, nerve injury, malunion, nonunion, hardware prominence, hardware failure, need for hardware removal, blood clots, cardiopulmonary complications, morbidity, mortality, among others, and they were willing to proceed.  Predicted outcome is good, although there will be at least a six to nine month expected recovery. Pt has history of DVT and will take extra precaution intra-op and treat with  blood thinners per his medical doctor post op.  He understands well increased risk of bleeding post-op.  Desmond Szabo L, MD 09/20/2014 9:21 AM

## 2014-09-20 NOTE — Transfer of Care (Signed)
Immediate Anesthesia Transfer of Care Note  Patient: [name]  Procedure(s) Performed: Procedure(s) with comments: ARTHROSCOPY KNEE (Left) - Medial menisectomy chondrpplasty patella  medial plica excision  Patient Location: PACU  Anesthesia Type:General  Level of Consciousness: sedated  Airway & Oxygen Therapy: Patient Spontanous Breathing and Patient connected to face mask oxygen  Post-op Assessment: Report given to RN and Post -op Vital signs reviewed and stable  Post vital signs: Reviewed and stable  Last Vitals:  Filed Vitals:   09/20/14 0902  BP: 138/91  Pulse: 80  Temp: 36.6 C  Resp: 22    Complications: No apparent anesthesia complications

## 2014-09-20 NOTE — Anesthesia Postprocedure Evaluation (Signed)
  Anesthesia Post-op Note  Patient: [name]  Procedure(s) Performed: Procedure(s) with comments: ARTHROSCOPY KNEE (Left) - Medial menisectomy chondrpplasty patella  medial plica excision  Patient Location: PACU  Anesthesia Type: General   Level of Consciousness: awake, alert  and oriented  Airway and Oxygen Therapy: Patient Spontanous Breathing  Post-op Pain: mild  Post-op Assessment: Post-op Vital signs reviewed  Post-op Vital Signs: Reviewed  Last Vitals:  Filed Vitals:   09/20/14 1145  BP: 148/97  Pulse: 74  Temp: 36.6 C  Resp: 20    Complications: No apparent anesthesia complications

## 2014-09-20 NOTE — Brief Op Note (Signed)
09/20/2014  10:22 AM  PATIENT:  [name]  49 y.o. male  PRE-OPERATIVE DIAGNOSIS:  left knee medial meniscus tear  POST-OPERATIVE DIAGNOSIS:  left knee medial meniscus tear  PROCEDURE:  Procedure(s) with comments: ARTHROSCOPY KNEE (Left) - Medial menisectomy condomelatia patella   SURGEON:  Surgeon(s) and Role:    * Dorna Leitz, MD - Primary  PHYSICIAN ASSISTANT:   ASSISTANTS: bethune   ANESTHESIA:   general  EBL:  Total I/O In: 700 [I.V.:700] Out: -   BLOOD ADMINISTERED:none  DRAINS: none   LOCAL MEDICATIONS USED:  MARCAINE     SPECIMEN:  No Specimen  DISPOSITION OF SPECIMEN:  N/A  COUNTS:  YES  TOURNIQUET:  * No tourniquets in log *  DICTATION: .Other Dictation: Dictation Number [id number]  PLAN OF CARE: Discharge to home after PACU  PATIENT DISPOSITION:  PACU - hemodynamically stable.   Delay start of Pharmacological VTE agent (>24hrs) due to surgical blood loss or risk of bleeding: no

## 2014-09-20 NOTE — Anesthesia Procedure Notes (Signed)
Procedure Name: LMA Insertion Date/Time: 09/20/2014 9:55 AM Performed by: Maryella Shivers Pre-anesthesia Checklist: Patient identified, Emergency Drugs available, Suction available and Patient being monitored Patient Re-evaluated:Patient Re-evaluated prior to inductionOxygen Delivery Method: Circle System Utilized Preoxygenation: Pre-oxygenation with 100% oxygen Intubation Type: IV induction Ventilation: Mask ventilation without difficulty LMA: LMA inserted LMA Size: 5.0 Number of attempts: 1 Airway Equipment and Method: Bite block Placement Confirmation: positive ETCO2 Tube secured with: Tape Dental Injury: Teeth and Oropharynx as per pre-operative assessment

## 2014-09-21 ENCOUNTER — Telehealth: Payer: Self-pay

## 2014-09-21 NOTE — Telephone Encounter (Signed)
Please see me pre op

## 2014-09-21 NOTE — Telephone Encounter (Signed)
Patient has already had his surgery . He has been instructed to take the xarelto twice a day after his meals. He is scheduled next week with Dr hopper

## 2014-09-21 NOTE — Op Note (Signed)
NAMEVIRGINIA, [name]            ACCOUNT NO.:  [id number]  MEDICAL RECORD NO.:  [id number]  LOCATION:                                FACILITY:  [facility]  PHYSICIAN:  Alta Corning, M.D.   DATE OF BIRTH:  [birth year]  DATE OF PROCEDURE:  09/20/2014 DATE OF DISCHARGE:  09/20/2014                              OPERATIVE REPORT   PREOPERATIVE DIAGNOSIS:  Medial meniscal tear.  POSTOPERATIVE DIAGNOSES: 1. Medial meniscal tear. 2. Chondromalacia of patellofemoral joint. 3. Medial shelf plica.  PRINCIPAL PROCEDURES: 1. Partial medial meniscectomy with corresponding debridement in the     medial compartment. 2. Chondroplasty of patellofemoral joint down to bleeding bone. 3. Debridement of medial shelf plica.  SURGEON:  Alta Corning, M.D.  ASSISTANT:  [name], P.A.  ANESTHESIA:  General.  BRIEF HISTORY:  [name] is a 49 year old male with long history of significant complaints of left knee pain.  He had been treated conservatively for prolonged period of time.  After failure of all conservative care, he was taken to the operating room for left knee arthroscopy.  He had a previous history of DVT after knee surgery and so we took precautions and put him in TEDs stockings and SCDs and we are going to treat him with Xarelto preoperatively.  He was brought to the operating room for this procedure.  DESCRIPTION OF PROCEDURE:  The patient was brought to the operating room.  After adequate anesthesia was obtained with general anesthetic, the patient was placed supine on the operating table.  Left leg was prepped and draped in usual sterile fashion.  Following this, routine arthroscopic examination of the knee revealed there was an obvious chondromalacia of the patellofemoral joint.  This was debrided initially and this went directly right down to bleeding bone.  There was an area about 4 x 4 mm in the inferior pole.  Attention was turned to the medial compartment.  There was a  large draping and dense medial shelf plica, which was debrided back to the wall of the knee joint.  We went into the medial compartment.  There was a large complex medial meniscal tear, which tracked in the midbody, back almost to the rim.  This was debrided with a combination of straight biting forceps and upbiting forceps.  The remaining meniscal rim was contoured down with the suction shaver. There was some grade 2 and maybe some early grade 3 change on lateral tibial plateau, which was debrided.  Attention was turned to the ACL, lateral side normal.  Remaining of the knee was examined once again for any loose fragment pieces, seeing none.  The knee was copiously and thoroughly lavaged and suctioned dry. The orthoscopic portals were closed with bandage.  Sterile compressive dressing was applied.  The patient was taken to the recovery room, he was noted to be in satisfactory condition.  Estimated blood loss for the procedure was minimal.     Alta Corning, M.D.     [name]  D:  09/20/2014  T:  09/20/2014  Job:  [id number]

## 2014-09-21 NOTE — Telephone Encounter (Addendum)
Phone call from patient. He is not sure how many xarelto he is to take. I explained he should be taking it twice a day. Should it be taken with a meal?   Dr Linna Darner when is patient to follow up with you?

## 2014-09-22 ENCOUNTER — Encounter (HOSPITAL_BASED_OUTPATIENT_CLINIC_OR_DEPARTMENT_OTHER): Payer: Self-pay | Admitting: Orthopedic Surgery

## 2014-09-25 ENCOUNTER — Encounter (HOSPITAL_BASED_OUTPATIENT_CLINIC_OR_DEPARTMENT_OTHER): Payer: Self-pay | Admitting: Orthopedic Surgery

## 2014-09-27 ENCOUNTER — Ambulatory Visit: Payer: BLUE CROSS/BLUE SHIELD | Admitting: Internal Medicine

## 2014-09-28 ENCOUNTER — Encounter: Payer: Self-pay | Admitting: Internal Medicine

## 2014-09-28 ENCOUNTER — Ambulatory Visit (INDEPENDENT_AMBULATORY_CARE_PROVIDER_SITE_OTHER): Payer: BLUE CROSS/BLUE SHIELD | Admitting: Internal Medicine

## 2014-09-28 VITALS — BP 150/100 | HR 82 | Temp 97.7°F | Resp 15 | Ht 66.0 in | Wt 258.0 lb

## 2014-09-28 DIAGNOSIS — Z86711 Personal history of pulmonary embolism: Secondary | ICD-10-CM

## 2014-09-28 DIAGNOSIS — Z9889 Other specified postprocedural states: Secondary | ICD-10-CM | POA: Diagnosis not present

## 2014-09-28 MED ORDER — RIVAROXABAN 20 MG PO TABS
20.0000 mg | ORAL_TABLET | Freq: Every day | ORAL | Status: DC
Start: 2014-09-28 — End: 2014-10-12

## 2014-09-28 NOTE — Progress Notes (Signed)
Pre visit review using our clinic review tool, if applicable. No additional management support is needed unless otherwise documented below in the visit note. 

## 2014-09-28 NOTE — Progress Notes (Signed)
   Subjective:    Patient ID: [name], male    DOB: Oct 26, 1965, 49 y.o.   MRN: [id number]  HPI He is improving in reference to pain in the left knee following the orthopedic surgery,arthroscopy with medial menisectomy & chondroplasty They did drain "grapefruit juice" colored fluid from the left knee 4/26. He is on Xarelto 15 mg twice a day because of past history of saddle embolus post orthopedic surgery. At that time he states he had been almost totally sedentary. He is ambulating with a crutch at this time.   He has some sore throat he relates to having had the endotracheal tube placed.  He has some minor chest pain which has been a chronic complaint. He has no other cardiopulmonary symptoms. He has no bleeding dyscrasias.  Review of Systems Denied are significant chest pain, palpitations, claudication, edema, paroxysmal nocturnal dyspnea, exertional dyspnea, significant cough, or hemoptysis.    Objective:   Physical Exam Pertinent or positive findings include: Slight fluctuance left knee. Op scar well-healed. He is wearing a soft brace on that knee. Homans sign negative bilaterally. Trace ankle edema. Ambulating with crutch.  General appearance :adequately nourished; in no distress. Eyes: No conjunctival inflammation or scleral icterus is present. Heart:  Normal rate and regular rhythm. S1 and S2 normal without gallop, murmur, click, rub or other extra sounds   Lungs:Chest clear to auscultation; no wheezes, rhonchi,rales ,or rubs present.No increased work of breathing.  Abdomen: bowel sounds normal, soft and non-tender without masses, organomegaly or hernias noted.  No guarding or rebound.  Vascular : all pulses equal ; no bruits present. Skin:Warm & dry.  Intact without suspicious lesions or rashes ; no tenting  Lymphatic: No lymphadenopathy is noted about the head, neck, axilla Neuro: Strength, tone  normal.         Assessment & Plan:  #1 PMH of PTE #2 post Ortho  knee surgery

## 2014-09-28 NOTE — Patient Instructions (Addendum)
Please report any abnormal bruising or bleeding while on Xarelto.Switch to 20 mg daily until seen in 2& 1/2  weeks. No aspirin , Advil,Aleve or other anti inflammatory medications while on Xarelto. Zicam Melts or Zinc lozenges as per package label for sore throat .

## 2014-10-03 ENCOUNTER — Ambulatory Visit: Payer: BLUE CROSS/BLUE SHIELD | Admitting: Internal Medicine

## 2014-10-04 ENCOUNTER — Telehealth: Payer: Self-pay | Admitting: Internal Medicine

## 2014-10-04 MED ORDER — SUCRALFATE 1 G PO TABS: ORAL_TABLET | ORAL | Status: DC

## 2014-10-04 NOTE — Telephone Encounter (Signed)
Dr. Linna Darner told him on his last visit that he would give him something for his throat. Its still hurting him pretty bad, because of the tube that was used during his surgery. [facility] [address]

## 2014-10-04 NOTE — Telephone Encounter (Signed)
Dissolve 1 tablet of generic Carafate  in 5 cc (1 teaspoon) of water. Take before meals and at bedtime #60

## 2014-10-10 ENCOUNTER — Encounter (HOSPITAL_COMMUNITY): Payer: Self-pay

## 2014-10-10 ENCOUNTER — Ambulatory Visit (HOSPITAL_COMMUNITY)
Admission: RE | Admit: 2014-10-10 | Discharge: 2014-10-10 | Disposition: A | Discharge status: Home / Self Care | Payer: BLUE CROSS/BLUE SHIELD | Source: Ambulatory Visit | Attending: Orthopedic Surgery | Admitting: Orthopedic Surgery

## 2014-10-10 ENCOUNTER — Other Ambulatory Visit (HOSPITAL_COMMUNITY): Payer: Self-pay | Admitting: Orthopedic Surgery

## 2014-10-10 DIAGNOSIS — M7989 Other specified soft tissue disorders: Principal | ICD-10-CM

## 2014-10-10 DIAGNOSIS — M79662 Pain in left lower leg: Secondary | ICD-10-CM

## 2014-10-10 DIAGNOSIS — I2699 Other pulmonary embolism without acute cor pulmonale: Secondary | ICD-10-CM

## 2014-10-10 DIAGNOSIS — R079 Chest pain, unspecified: Secondary | ICD-10-CM

## 2014-10-10 DIAGNOSIS — R0602 Shortness of breath: Secondary | ICD-10-CM | POA: Diagnosis not present

## 2014-10-10 MED ORDER — IOHEXOL 350 MG/ML SOLN
100.0000 mL | Freq: Once | INTRAVENOUS | Status: AC | PRN
  Administered 2014-10-10: 100 mL via INTRAVENOUS

## 2014-10-11 ENCOUNTER — Ambulatory Visit (HOSPITAL_COMMUNITY)
Admission: RE | Admit: 2014-10-11 | Discharge: 2014-10-11 | Disposition: A | Discharge status: Home / Self Care | Payer: BLUE CROSS/BLUE SHIELD | Source: Ambulatory Visit | Attending: Internal Medicine | Admitting: Internal Medicine

## 2014-10-11 DIAGNOSIS — M7989 Other specified soft tissue disorders: Secondary | ICD-10-CM | POA: Insufficient documentation

## 2014-10-11 DIAGNOSIS — M79662 Pain in left lower leg: Secondary | ICD-10-CM

## 2014-10-11 DIAGNOSIS — M79605 Pain in left leg: Secondary | ICD-10-CM | POA: Diagnosis not present

## 2014-10-11 NOTE — Progress Notes (Signed)
VASCULAR LAB PRELIMINARY  PRELIMINARY  PRELIMINARY  PRELIMINARY  Left lower extremity venous duplex completed.    Preliminary report:  Left:  No evidence of DVT, superficial thrombosis, or Baker's cyst.  Calyb Mcquarrie, RVT 10/11/2014, 2:00 PM

## 2014-10-12 ENCOUNTER — Other Ambulatory Visit (INDEPENDENT_AMBULATORY_CARE_PROVIDER_SITE_OTHER): Payer: BLUE CROSS/BLUE SHIELD

## 2014-10-12 ENCOUNTER — Encounter: Payer: Self-pay | Admitting: Internal Medicine

## 2014-10-12 ENCOUNTER — Ambulatory Visit (INDEPENDENT_AMBULATORY_CARE_PROVIDER_SITE_OTHER): Payer: BLUE CROSS/BLUE SHIELD | Admitting: Internal Medicine

## 2014-10-12 VITALS — BP 130/92 | HR 79 | Temp 98.0°F | Ht 66.0 in | Wt 266.2 lb

## 2014-10-12 DIAGNOSIS — R0789 Other chest pain: Secondary | ICD-10-CM | POA: Diagnosis not present

## 2014-10-12 DIAGNOSIS — Z86711 Personal history of pulmonary embolism: Secondary | ICD-10-CM | POA: Diagnosis not present

## 2014-10-12 DIAGNOSIS — R0600 Dyspnea, unspecified: Secondary | ICD-10-CM | POA: Diagnosis not present

## 2014-10-12 LAB — TROPONIN I: TNIDX: 0 ug/l (ref 0.00–0.06)

## 2014-10-12 MED ORDER — RIVAROXABAN 20 MG PO TABS: 20.0000 mg | ORAL_TABLET | Freq: Every day | ORAL | Status: DC

## 2014-10-12 NOTE — Progress Notes (Signed)
   Subjective:    Patient ID: [name], male    DOB: 1966/01/11, 49 y.o.   MRN: [id number]  HPI He completed his Xarelto samples 10/09/14; he did not call for additional medication. He had been in instructed to be seen prior to running out of the medication.  When seen by his Orthopedist 5/10 ;he described chest pain. CT revealed no pulmonary emboli. CT did suggest coronary artery calcifications. Coronary arteries were normal at catheterization 2007;but he did have left ventricular dysfunction. Ultrasound of lower extremities is pending.  He's had shortness of breath for 2 weeks which lasts 15-30 minutes and is occurring approximately twice a day. Intermittently he's had a non productive cough. On 2 occasions he did have some yellow-green sputum.No infiltrates on CT scan.  The chest pain is described up to level VIII-X and is crushing. One episode was associated with nausea and vomiting. He also had associated fatigue.  His last episode of chest pain or shortness of breath was at 6 AM this morning lasting 10 minutes.  He has a history of heterozygote factor V Leiden mutation. In 2013 he was on the novel anticoagulant Xarelto for a total of 6 months. There is no family history of coagulopathy; but his MGM had "a clot".   Review of Systems Epistaxis, hemoptysis, hematuria, melena, or rectal bleeding denied on Xarelto. No unexplained weight loss, dysphagia, or abdominal pain reported. No persistently small caliber stools  There has been  no abnormal bruising or bleeding. There is no difficulty stopping bleeding with injury.    Objective:   Physical Exam  Appears healthy and well-nourished & in no acute distress  No carotid bruits are present.No neck vein distention present at 10 - 15 degrees. Thyroid normal to palpation  Heart rhythm and rate are normal with no gallop or murmur  Chest is clear with no increased work of breathing  There is no evidence of aortic aneurysm or renal  artery bruits  Abdomen protuberant but soft with no organomegaly or masses. No HJR  No clubbing, cyanosis or edema present.  Pedal pulses are intact . Homans sign negative bilaterally  No ischemic skin changes are present . Multiple scattered tattoos. Fingernails healthy   Crepitus right knee; left knee in brace  Alert and oriented. Strength, tone, DTRs reflexes normal        Assessment & Plan:  #1 chest pain and dyspnea in the context of being off anticoagulation post orthopedic procedure   #2 past history of pulmonary thromboemboli   #3 coronary artery calcifications on CT; negative catheterization 2007 except for LV dysfunction  Plan: See orders  Hematology consultation will be pursued to assess the need for lifelong anticoagulation.  Because he had episode of chest pain this morning troponin will be collected.

## 2014-10-12 NOTE — Assessment & Plan Note (Signed)
Hematology referral possible lifelong anticoagulation prophylactically

## 2014-10-12 NOTE — Progress Notes (Signed)
Pre visit review using our clinic review tool, if applicable. No additional management support is needed unless otherwise documented below in the visit note. 

## 2014-10-12 NOTE — Patient Instructions (Addendum)
The Hematology referral will be scheduled and you'll be notified of the time.Please call the Referral Co-Ordinator @ [phone] if you have not been notified of appointment time within 7-10 days.  Your next office appointment will be determined based upon review of your pending labs . Those instructions will be transmitted to you by My Chart  Critical results will be called.   Followup as needed for any active or acute issue. Please report any significant change in your symptoms.

## 2014-11-23 ENCOUNTER — Ambulatory Visit (HOSPITAL_COMMUNITY)
Admission: RE | Admit: 2014-11-23 | Discharge: 2014-11-23 | Disposition: A | Discharge status: Home / Self Care | Payer: BLUE CROSS/BLUE SHIELD | Source: Ambulatory Visit | Attending: Internal Medicine | Admitting: Internal Medicine

## 2014-11-23 ENCOUNTER — Other Ambulatory Visit (HOSPITAL_COMMUNITY): Payer: Self-pay | Admitting: Orthopedic Surgery

## 2014-11-23 DIAGNOSIS — M7989 Other specified soft tissue disorders: Secondary | ICD-10-CM

## 2014-11-23 DIAGNOSIS — M79605 Pain in left leg: Secondary | ICD-10-CM | POA: Diagnosis not present

## 2014-11-23 DIAGNOSIS — M79662 Pain in left lower leg: Secondary | ICD-10-CM

## 2014-11-23 NOTE — Progress Notes (Signed)
VASCULAR LAB PRELIMINARY  PRELIMINARY  PRELIMINARY  PRELIMINARY  Left lower extremity venous Doppler completed.    Preliminary report:  There is no DVT or SVT noted in the left lower extremity.   Gabrien Mentink, RVT 11/23/2014, 6:15 PM

## 2014-11-24 ENCOUNTER — Emergency Department (HOSPITAL_COMMUNITY): Payer: BLUE CROSS/BLUE SHIELD

## 2014-11-24 ENCOUNTER — Encounter (HOSPITAL_COMMUNITY): Payer: Self-pay

## 2014-11-24 ENCOUNTER — Emergency Department (HOSPITAL_COMMUNITY)
Admission: EM | Admit: 2014-11-24 | Discharge: 2014-11-24 | Disposition: A | Discharge status: Home / Self Care | Payer: BLUE CROSS/BLUE SHIELD | Attending: Emergency Medicine | Admitting: Emergency Medicine

## 2014-11-24 ENCOUNTER — Other Ambulatory Visit: Payer: Self-pay

## 2014-11-24 DIAGNOSIS — I10 Essential (primary) hypertension: Secondary | ICD-10-CM | POA: Insufficient documentation

## 2014-11-24 DIAGNOSIS — Z86711 Personal history of pulmonary embolism: Secondary | ICD-10-CM | POA: Insufficient documentation

## 2014-11-24 DIAGNOSIS — Z9981 Dependence on supplemental oxygen: Secondary | ICD-10-CM | POA: Diagnosis not present

## 2014-11-24 DIAGNOSIS — Z79899 Other long term (current) drug therapy: Secondary | ICD-10-CM | POA: Diagnosis not present

## 2014-11-24 DIAGNOSIS — G4733 Obstructive sleep apnea (adult) (pediatric): Secondary | ICD-10-CM | POA: Insufficient documentation

## 2014-11-24 DIAGNOSIS — R0602 Shortness of breath: Secondary | ICD-10-CM | POA: Diagnosis present

## 2014-11-24 DIAGNOSIS — R221 Localized swelling, mass and lump, neck: Secondary | ICD-10-CM | POA: Insufficient documentation

## 2014-11-24 DIAGNOSIS — Z86718 Personal history of other venous thrombosis and embolism: Secondary | ICD-10-CM | POA: Insufficient documentation

## 2014-11-24 DIAGNOSIS — R0789 Other chest pain: Secondary | ICD-10-CM

## 2014-11-24 DIAGNOSIS — R0600 Dyspnea, unspecified: Secondary | ICD-10-CM | POA: Diagnosis not present

## 2014-11-24 LAB — CBC
HCT: 42.5 % (ref 39.0–52.0)
HEMOGLOBIN: 14.5 g/dL (ref 13.0–17.0)
MCH: 28.7 pg (ref 26.0–34.0)
MCHC: 34.1 g/dL (ref 30.0–36.0)
MCV: 84.2 fL (ref 78.0–100.0)
Platelets: 280 10*3/uL (ref 150–400)
RBC: 5.05 MIL/uL (ref 4.22–5.81)
RDW: 13.1 % (ref 11.5–15.5)
WBC: 15.8 10*3/uL — ABNORMAL HIGH (ref 4.0–10.5)

## 2014-11-24 LAB — BASIC METABOLIC PANEL
Anion gap: 9 (ref 5–15)
BUN: 12 mg/dL (ref 6–20)
CHLORIDE: 107 mmol/L (ref 101–111)
CO2: 22 mmol/L (ref 22–32)
Calcium: 9.4 mg/dL (ref 8.9–10.3)
Creatinine, Ser: 0.88 mg/dL (ref 0.61–1.24)
Glucose, Bld: 125 mg/dL — ABNORMAL HIGH (ref 65–99)
Potassium: 3.9 mmol/L (ref 3.5–5.1)
SODIUM: 138 mmol/L (ref 135–145)

## 2014-11-24 LAB — I-STAT TROPONIN, ED
Troponin i, poc: 0.01 ng/mL (ref 0.00–0.08)
Troponin i, poc: 0 ng/mL (ref 0.00–0.08)

## 2014-11-24 LAB — BRAIN NATRIURETIC PEPTIDE: B NATRIURETIC PEPTIDE 5: 24.3 pg/mL (ref 0.0–100.0)

## 2014-11-24 MED ORDER — MORPHINE SULFATE 4 MG/ML IJ SOLN
4.0000 mg | Freq: Once | INTRAMUSCULAR | Status: AC
Start: 2014-11-24 — End: 2014-11-24
  Administered 2014-11-24: 4 mg via INTRAVENOUS
  Filled 2014-11-24: qty 1

## 2014-11-24 MED ORDER — DOXYCYCLINE HYCLATE 100 MG PO CAPS: 100.0000 mg | ORAL_CAPSULE | Freq: Two times a day (BID) | ORAL | Status: DC

## 2014-11-24 MED ORDER — ALBUTEROL SULFATE HFA 108 (90 BASE) MCG/ACT IN AERS
1.0000 | INHALATION_SPRAY | Freq: Once | RESPIRATORY_TRACT | Status: AC
Start: 2014-11-24 — End: 2014-11-24
  Administered 2014-11-24: 1 via RESPIRATORY_TRACT
  Filled 2014-11-24: qty 6.7

## 2014-11-24 MED ORDER — IBUPROFEN 600 MG PO TABS: 600.0000 mg | ORAL_TABLET | Freq: Four times a day (QID) | ORAL | Status: DC | PRN

## 2014-11-24 NOTE — Discharge Instructions (Signed)
We saw you in the ER for the chest pain and shortness of breath. All the results in the ER are normal, labs and imaging. We are not sure what is causing your symptoms. Chest Xrays show possible infection, so we will send you home with antibiotics. We dont think you have a blood clot currently - you had a normal CT scan just a month ago. If your symptoms get worse - return to the ER.   Chest Pain (Nonspecific) It is often hard to give a specific diagnosis for the cause of chest pain. There is always a chance that your pain could be related to something serious, such as a heart attack or a blood clot in the lungs. You need to follow up with your health care provider for further evaluation. CAUSES   Heartburn.  Pneumonia or bronchitis.  Anxiety or stress.  Inflammation around your heart (pericarditis) or lung (pleuritis or pleurisy).  A blood clot in the lung.  A collapsed lung (pneumothorax). It can develop suddenly on its own (spontaneous pneumothorax) or from trauma to the chest.  Shingles infection (herpes zoster virus). The chest wall is composed of bones, muscles, and cartilage. Any of these can be the source of the pain.  The bones can be bruised by injury.  The muscles or cartilage can be strained by coughing or overwork.  The cartilage can be affected by inflammation and become sore (costochondritis). DIAGNOSIS  Lab tests or other studies may be needed to find the cause of your pain. Your health care provider may have you take a test called an ambulatory electrocardiogram (ECG). An ECG records your heartbeat patterns over a 24-hour period. You may also have other tests, such as:  Transthoracic echocardiogram (TTE). During echocardiography, sound waves are used to evaluate how blood flows through your heart.  Transesophageal echocardiogram (TEE).  Cardiac monitoring. This allows your health care provider to monitor your heart rate and rhythm in real time.  Holter  monitor. This is a portable device that records your heartbeat and can help diagnose heart arrhythmias. It allows your health care provider to track your heart activity for several days, if needed.  Stress tests by exercise or by giving medicine that makes the heart beat faster. TREATMENT   Treatment depends on what may be causing your chest pain. Treatment may include:  Acid blockers for heartburn.  Anti-inflammatory medicine.  Pain medicine for inflammatory conditions.  Antibiotics if an infection is present.  You may be advised to change lifestyle habits. This includes stopping smoking and avoiding alcohol, caffeine, and chocolate.  You may be advised to keep your head raised (elevated) when sleeping. This reduces the chance of acid going backward from your stomach into your esophagus. Most of the time, nonspecific chest pain will improve within 2-3 days with rest and mild pain medicine.  HOME CARE INSTRUCTIONS   If antibiotics were prescribed, take them as directed. Finish them even if you start to feel better.  For the next few days, avoid physical activities that bring on chest pain. Continue physical activities as directed.  Do not use any tobacco products, including cigarettes, chewing tobacco, or electronic cigarettes.  Avoid drinking alcohol.  Only take medicine as directed by your health care provider.  Follow your health care provider's suggestions for further testing if your chest pain does not go away.  Keep any follow-up appointments you made. If you do not go to an appointment, you could develop lasting (chronic) problems with pain. If there  is any problem keeping an appointment, call to reschedule. SEEK MEDICAL CARE IF:   Your chest pain does not go away, even after treatment.  You have a rash with blisters on your chest.  You have a fever. SEEK IMMEDIATE MEDICAL CARE IF:   You have increased chest pain or pain that spreads to your arm, neck, jaw, back, or  abdomen.  You have shortness of breath.  You have an increasing cough, or you cough up blood.  You have severe back or abdominal pain.  You feel nauseous or vomit.  You have severe weakness.  You faint.  You have chills. This is an emergency. Do not wait to see if the pain will go away. Get medical help at once. Call your local emergency services (911 in [place] Do not drive yourself to the hospital. MAKE SURE YOU:   Understand these instructions.  Will watch your condition.  Will get help right away if you are not doing well or get worse. Document Released: 02/26/2005 Document Revised: 05/24/2013 Document Reviewed: 12/23/2007 [facility] Patient Information 2015 [place], [place]. This information is not intended to replace advice given to you by your health care provider. Make sure you discuss any questions you have with your health care provider. Acute Bronchitis Bronchitis is inflammation of the airways that extend from the windpipe into the lungs (bronchi). The inflammation often causes mucus to develop. This leads to a cough, which is the most common symptom of bronchitis.  In acute bronchitis, the condition usually develops suddenly and goes away over time, usually in a couple weeks. Smoking, allergies, and asthma can make bronchitis worse. Repeated episodes of bronchitis may cause further lung problems.  CAUSES Acute bronchitis is most often caused by the same virus that causes a cold. The virus can spread from person to person (contagious) through coughing, sneezing, and touching contaminated objects. SIGNS AND SYMPTOMS   Cough.   Fever.   Coughing up mucus.   Body aches.   Chest congestion.   Chills.   Shortness of breath.   Sore throat.  DIAGNOSIS  Acute bronchitis is usually diagnosed through a physical exam. Your health care provider will also ask you questions about your medical history. Tests, such as chest X-rays, are sometimes done to rule out other  conditions.  TREATMENT  Acute bronchitis usually goes away in a couple weeks. Oftentimes, no medical treatment is necessary. Medicines are sometimes given for relief of fever or cough. Antibiotic medicines are usually not needed but may be prescribed in certain situations. In some cases, an inhaler may be recommended to help reduce shortness of breath and control the cough. A cool mist vaporizer may also be used to help thin bronchial secretions and make it easier to clear the chest.  HOME CARE INSTRUCTIONS  Get plenty of rest.   Drink enough fluids to keep your urine clear or pale yellow (unless you have a medical condition that requires fluid restriction). Increasing fluids may help thin your respiratory secretions (sputum) and reduce chest congestion, and it will prevent dehydration.   Take medicines only as directed by your health care provider.  If you were prescribed an antibiotic medicine, finish it all even if you start to feel better.  Avoid smoking and secondhand smoke. Exposure to cigarette smoke or irritating chemicals will make bronchitis worse. If you are a smoker, consider using nicotine gum or skin patches to help control withdrawal symptoms. Quitting smoking will help your lungs heal faster.   Reduce the chances  of another bout of acute bronchitis by washing your hands frequently, avoiding people with cold symptoms, and trying not to touch your hands to your mouth, nose, or eyes.   Keep all follow-up visits as directed by your health care provider.  SEEK MEDICAL CARE IF: Your symptoms do not improve after 1 week of treatment.  SEEK IMMEDIATE MEDICAL CARE IF:  You develop an increased fever or chills.   You have chest pain.   You have severe shortness of breath.  You have bloody sputum.   You develop dehydration.  You faint or repeatedly feel like you are going to pass out.  You develop repeated vomiting.  You develop a severe headache. MAKE SURE YOU:    Understand these instructions.  Will watch your condition.  Will get help right away if you are not doing well or get worse. Document Released: 06/26/2004 Document Revised: 10/03/2013 Document Reviewed: 11/09/2012 [facility] Patient Information 2015 [place], [place]. This information is not intended to replace advice given to you by your health care provider. Make sure you discuss any questions you have with your health care provider.

## 2014-11-24 NOTE — ED Notes (Signed)
Pt states he has a hx of multiple PE's and DVT's.

## 2014-11-24 NOTE — ED Notes (Addendum)
Pt states he started having SOB and chest pain this morning at 1000. No other symptoms. States the pain is sharp. Hx of saddle PE

## 2014-11-24 NOTE — ED Notes (Signed)
MD at bedside. 

## 2014-11-24 NOTE — ED Notes (Signed)
Pt ambulated in the hallway. Heart rate 94, o2 95.

## 2014-11-24 NOTE — ED Provider Notes (Signed)
CSN: [id number]     Arrival date & time 11/24/14  1427 History   First MD Initiated Contact with Patient 11/24/14 1604     Chief Complaint  Patient presents with  . Chest Pain  . Shortness of Breath     (Consider location/radiation/quality/duration/timing/severity/associated sxs/prior Treatment) HPI Comments: [name] is a 49 y/o male with a history of DVT, PE, factor V leiden and 6 year history of chronic chest pain who presents to the ED with chest pain.  Patient experiences chest pain 2-3 times a month, but today at 10am felt significant increase in intensity and severity of pain which prompted him to come to ED.  Pain stabbing in nature in the middle of chest, waxing and waning, with intense pain lasting for 3-5 min intervals.  Also experienced shortness of breath w/o pain on inhalation and mild chest wall tenderness with palpation.  States pain feels the same as his usual chest pain, just "more intense and more trouble breathing."  Has chronic intermittent swelling of bilateral lower extremities which today have been normal.  Finished course of Xarelto and had CT scan at end of May which showed no evidence of clots.  Patient states has been having chest pain "for years" and last had a cardiac work-up "6 or 7 years ago" with no acute findings.  Denies radiation of pain.  Denies nausea, vomiting, fever, sweats, chills.   Patient also complains of a "lump in my throat" that he first noticed 4 days ago.  Lump in middle of neck slightly to right side of trachea.  States lump was significantly swollen and tender to palpation 4 days ago and has slowly decreased in size and pain since then.     ROS 10 Systems reviewed and are negative for acute change except as noted in the HPI.     The history is provided by the patient.    Past Medical History  Diagnosis Date  . Cluster headache   . Rotator cuff arthropathy 2007    right  . DVT of lower extremity (deep venous thrombosis) 6/26-28/2013     Xarelto   . Saddle pulmonary embolus 6/26-28/2013    post orthopedic surgery  . Irritability and anger     PMH of  . Sleep apnea 5/15    moderate OSA-has cpap  . HTN (hypertension)     no meds   Past Surgical History  Procedure Laterality Date  . Wisdom tooth extraction    . Cardiac catheterization  2007    LVdysfunction; normal coronaries  . Upper gastrointestinal endoscopy  2007  . Arthroscopic knee surgery  10/09/2011    Dr Ninfa Linden  . Rotator cuff repair  2007    right  . Knee arthroscopy with medial menisectomy Left 09/20/2014    medial menisectomy chondroplastytella medial plica excision  ;  Surgeon: Dorna Leitz, MD;  Location: [facility];  Service: Orthopedics;  Laterality: Left;   Family History  Problem Relation Age of Onset  . Cancer Mother     Mesothelioma   . Diabetes Brother   . Hypertension Brother   . Stroke Brother 82  . Diabetes Maternal Aunt   . Diabetes Maternal Grandmother   . Heart disease Maternal Grandmother   . Hypertension Maternal Grandmother   . Stroke Maternal Grandmother     in 41s  . Stroke Maternal Grandfather     > 55  . Migraines Brother   . Deep vein thrombosis Maternal Grandmother   .  Heart attack Brother 68  . Heart attack Brother 39   History  Substance Use Topics  . Smoking status: Never Smoker   . Smokeless tobacco: Never Used  . Alcohol Use: Yes     Comment: occ    Review of Systems  Respiratory: Positive for shortness of breath.   Cardiovascular: Positive for chest pain.  All other systems reviewed and are negative.     Allergies  Review of patient's allergies indicates no known allergies.  Home Medications   Prior to Admission medications   Medication Sig Start Date End Date Taking? Authorizing Provider  Multiple Vitamins-Minerals (MULTIVITAMIN WITH MINERALS) tablet Take 1 tablet by mouth daily.   Yes Historical Provider, MD  oxyCODONE-acetaminophen (PERCOCET/ROXICET) 5-325 MG per tablet Take  1-2 tablets by mouth every 6 (six) hours as needed for severe pain. 09/20/14  Yes Gary Fleet, PA-C  doxycycline (VIBRAMYCIN) 100 MG capsule Take 1 capsule (100 mg total) by mouth 2 (two) times daily. 11/24/14   Varney Biles, MD  ibuprofen (ADVIL,MOTRIN) 600 MG tablet Take 1 tablet (600 mg total) by mouth every 6 (six) hours as needed. 11/24/14   Varney Biles, MD  rivaroxaban (XARELTO) 20 MG TABS tablet Take 1 tablet (20 mg total) by mouth daily with supper. Patient not taking: Reported on 11/24/2014 10/12/14   Hendricks Limes, MD  sucralfate (CARAFATE) 1 G tablet Dissolve 1 tablet of generic Carafate in 5 cc of water Take before meals and at bedtime Patient not taking: Reported on 11/24/2014 10/04/14   Hendricks Limes, MD   BP 118/97 mmHg  Pulse 88  Temp(Src) 97.8 F (36.6 C) (Oral)  Resp 22  Ht 5\' 7"  (1.702 m)  Wt 257 lb (116.574 kg)  BMI 40.24 kg/m2  SpO2 92% Physical Exam  Constitutional: He is oriented to person, place, and time. He appears well-developed.  HENT:  Head: Normocephalic and atraumatic.  Eyes: Conjunctivae and EOM are normal. Pupils are equal, round, and reactive to light.  Neck: Normal range of motion. Neck supple.  0.25 cm mobile nodule to the anterior neck just inferior to the thyroid gland.  Cardiovascular: Normal rate, regular rhythm and intact distal pulses.   No murmur heard. Pulmonary/Chest: Effort normal and breath sounds normal. No respiratory distress. He has no wheezes. He has no rales.  Abdominal: Soft. Bowel sounds are normal. He exhibits no distension. There is no tenderness. There is no rebound and no guarding.  Neurological: He is alert and oriented to person, place, and time.  Skin: Skin is warm.  Nursing note and vitals reviewed.   ED Course  Procedures (including critical care time) Labs Review Labs Reviewed  BASIC METABOLIC PANEL - Abnormal; Notable for the following:    Glucose, Bld 125 (*)    All other components within normal limits   CBC - Abnormal; Notable for the following:    WBC 15.8 (*)    All other components within normal limits  BRAIN NATRIURETIC PEPTIDE  I-STAT TROPOININ, ED  Randolm Idol, ED    Imaging Review Dg Chest 2 View  11/24/2014   CLINICAL DATA:  Chest pain and shortness of breath began this morning. History of DVT and blood clots in the lungs. Sleep apnea. History of hypertension, smoking.  EXAM: CHEST  2 VIEW  COMPARISON:  07/31/2014  FINDINGS: Shallow lung inflation. Heart size is normal. There are prominent interstitial markings throughout the lungs, slightly asymmetric left greater than right. No overt airspace filling. No pleural effusions.  IMPRESSION:  1. Interval development of interstitial process. 2. Considerations include inflammatory and infectious processes. The appearance is atypical for edema given the normal heart size.   Electronically Signed   By: Nolon Nations M.D.   On: 11/24/2014 15:29     EKG Interpretation   Date/Time:  Friday November 24 2014 14:54:05 EDT Ventricular Rate:  88 PR Interval:  140 QRS Duration: 90 QT Interval:  358 QTC Calculation: 433 R Axis:   38 Text Interpretation:  Normal sinus rhythm Nonspecific T wave abnormality  Abnormal ECG No acute changes Confirmed by Kathrynn Humble, MD, Farrell Broerman ([id number]) on  11/24/2014 5:00:33 PM        10/2014 CT-PE  IMPRESSION: 1. Suboptimal contrast bolus timing in the pulmonary arterial system but no evidence of acute pulmonary embolus. 2. No acute pulmonary process, minor atelectasis. 3. Suspect calcified Coronary artery atherosclerosis.  DVT study 11/2014: Neg for acute DVT  MDM   Final diagnoses:  Atypical chest pain  Dyspnea    Pt comes in with chest pain. PMHx of DVT/PE, factor V leiden, recent knee meniscal surgery in April - already ambulating. Chest pain is similar to previous chest pain and is intermittent, atypical - but there is L sided radiation. + DIB. Pain has been present for years now, with couple of  episodes a month. No infection like symptoms currently, but there is a white count elevation. Neg CT PE last month. Normal lung exam currently. No cardiac dz hx.  HEART SCORE: 2 Not suspicious 0   ECG  Normal 0   Age 53 - 13 years 1  Risk Factors  1-2 risk factor: 1   Troponin  ? normal limit 0  PE considered in the ddx- but with nwg L sided DVT studies x 2 in the last 1 month, neg CT PE in the last month, and normal hemodynamics, lack of pleuritic component to PE, no hypoxia or resp distress - the benefit of CTPE doesn't outweigh the risk in this case of low pretest probability for PE. Will get ambulatory pulsox. Will get CE x 2.   9:17 PM Ambulatory pulsox was normal. CXR results discussed. There is no clinical concerns for CAP - the CP is not pleuritic, and there is no new productive cough or fevers or focal lung abnormality.  Strict return precautions discussed.   Varney Biles, MD 11/24/14 2119

## 2014-11-27 ENCOUNTER — Ambulatory Visit (INDEPENDENT_AMBULATORY_CARE_PROVIDER_SITE_OTHER): Payer: BLUE CROSS/BLUE SHIELD | Admitting: Internal Medicine

## 2014-11-27 ENCOUNTER — Encounter: Payer: Self-pay | Admitting: Emergency Medicine

## 2014-11-27 ENCOUNTER — Encounter: Payer: Self-pay | Admitting: Internal Medicine

## 2014-11-27 VITALS — BP 126/80 | HR 90 | Temp 98.0°F | Resp 20 | Wt 267.0 lb

## 2014-11-27 DIAGNOSIS — R05 Cough: Secondary | ICD-10-CM

## 2014-11-27 DIAGNOSIS — R0600 Dyspnea, unspecified: Secondary | ICD-10-CM

## 2014-11-27 DIAGNOSIS — R059 Cough, unspecified: Secondary | ICD-10-CM

## 2014-11-27 DIAGNOSIS — F458 Other somatoform disorders: Secondary | ICD-10-CM | POA: Diagnosis not present

## 2014-11-27 DIAGNOSIS — R0989 Other specified symptoms and signs involving the circulatory and respiratory systems: Secondary | ICD-10-CM

## 2014-11-27 DIAGNOSIS — R198 Other specified symptoms and signs involving the digestive system and abdomen: Secondary | ICD-10-CM

## 2014-11-27 MED ORDER — MONTELUKAST SODIUM 10 MG PO TABS: 10.0000 mg | ORAL_TABLET | Freq: Every day | ORAL | Status: DC

## 2014-11-27 NOTE — Progress Notes (Signed)
   Subjective:    Patient ID: [name], male    DOB: [birth year], 49 y.o.   MRN: [id number]  HPI He describes a knot in his throat for the last 2 weeks. There was no trigger. He felt that it was of significant size but has decreased in size but not disappeared. He denies significant reflux or extrinsic symptoms.  He's also has had shortness of breath for 2 months. He was seen in the emergency room 11/24/14. The ER note states CC was chest pain.Recurrent PTE felt unlikely. He was administered an inhaler which has not been of benefit.  He now has a cough which is mainly dry. There is scant sputum. He has associated hoarseness. He does have some sneezing. He has no symptoms or signs of upper respiratory tract infection otherwise.   Review of Systems Frontal headache, facial pain , nasal purulence, dental pain, sore throat , otic pain or otic discharge denied. No fever , chills or sweats. Extrinsic symptoms of itchy, watery eyes,  or angioedema are denied. There is no wheezing or  paroxysmal nocturnal dyspnea.Unexplained weight loss, abdominal pain, significant dyspepsia, dysphagia, melena, rectal bleeding, or persistently small caliber stools are denied.     Objective:   Physical Exam Pertinent or positive findings include: Head is shaven. He has a beard and mustache. There is nasal erythema. No cervical area LA or masses noted.Abdomen is protuberant and massive.  General appearance :adequately nourished; in no distress.  Eyes: No conjunctival inflammation or scleral icterus is present.  Oral exam:  Lips and gums are healthy appearing.There is no oropharyngeal erythema or exudate noted. Dental hygiene is good.  Heart:  Normal rate and regular rhythm. S1 and S2 normal without gallop, murmur, click, rub or other extra sounds    Lungs:Chest clear to auscultation; no wheezes, rhonchi,rales ,or rubs present.No increased work of breathing.   Abdomen: bowel sounds normal, soft and  non-tender without masses, organomegaly or hernias noted.  No guarding or rebound.   Vascular : all pulses equal ; no bruits present.  Skin:Warm & dry.  Intact without suspicious lesions or rashes ; no tenting  Lymphatic: No lymphadenopathy is noted about the head, neck, axilla   Neuro: Strength, tone & DTRs normal.        Assessment & Plan:  #1 subjective "knot" in the throat; improved by history. Probable globus sensation. [place] of neck if recurrent,  #2 dyspnea  Plan: See orders and recommendations

## 2014-11-27 NOTE — Patient Instructions (Signed)
Reflux of gastric acid may be asymptomatic as this may occur mainly during sleep.The triggers for reflux  include stress; the "aspirin family" ; alcohol; peppermint; and caffeine (coffee, tea, cola, and chocolate). The aspirin family would include aspirin and the nonsteroidal agents such as ibuprofen &  Naproxen. Tylenol would not cause reflux. If having symptoms ; food & drink should be avoided for @ least 2 hours before going to bed.  

## 2014-11-27 NOTE — Progress Notes (Signed)
Pre visit review using our clinic review tool, if applicable. No additional management support is needed unless otherwise documented below in the visit note. 

## 2015-01-31 ENCOUNTER — Ambulatory Visit (INDEPENDENT_AMBULATORY_CARE_PROVIDER_SITE_OTHER): Payer: BLUE CROSS/BLUE SHIELD | Admitting: Family Medicine

## 2015-01-31 ENCOUNTER — Encounter: Payer: Self-pay | Admitting: Family Medicine

## 2015-01-31 ENCOUNTER — Other Ambulatory Visit (INDEPENDENT_AMBULATORY_CARE_PROVIDER_SITE_OTHER): Payer: BLUE CROSS/BLUE SHIELD

## 2015-01-31 VITALS — BP 142/84 | HR 94 | Ht 66.0 in | Wt 270.0 lb

## 2015-01-31 DIAGNOSIS — M2550 Pain in unspecified joint: Secondary | ICD-10-CM | POA: Diagnosis not present

## 2015-01-31 DIAGNOSIS — M1712 Unilateral primary osteoarthritis, left knee: Secondary | ICD-10-CM

## 2015-01-31 LAB — SEDIMENTATION RATE: Sed Rate: 12 mm/hr (ref 0–22)

## 2015-01-31 LAB — C-REACTIVE PROTEIN: CRP: 0.6 mg/dL (ref 0.5–20.0)

## 2015-01-31 MED ORDER — MELOXICAM 15 MG PO TABS: 15.0000 mg | ORAL_TABLET | Freq: Every day | ORAL | Status: DC

## 2015-01-31 MED ORDER — FUROSEMIDE 20 MG PO TABS: 20.0000 mg | ORAL_TABLET | Freq: Every day | ORAL | Status: DC

## 2015-01-31 NOTE — Progress Notes (Signed)
Pre visit review using our clinic review tool, if applicable. No additional management support is needed unless otherwise documented below in the visit note. 

## 2015-01-31 NOTE — Progress Notes (Signed)
  [name] [address] [place], [place] [address] Phone: [phone] Subjective:     CC: Bilateral knee pain  RFX:[id number] TRINDON DORTON is a 49 y.o. male coming in with complaint of bilateral knee pain. ` Patient was last seen over 5 months ago for a left meniscal tear. Patient did have arthroscopic procedure done on this knee 5 months ago. Patient states she was doing better for approximately 6-8 weeks. States though that now the knees are starting to hurt more with him doing the exercises on a regular basis and working on hard surfaces. Patient is concerned because he is done everything else. Continues to wear braces on a regular basis. Noticed swelling of both legs at the end of the day. Past medical history, social, surgical and family history all reviewed in electronic medical record.   Review of Systems: No headache, visual changes, nausea, vomiting, diarrhea, constipation, dizziness, abdominal pain, skin rash, fevers, chills, night sweats, weight loss, swollen lymph nodes, body aches, joint swelling, muscle aches, chest pain, shortness of breath, mood changes.   Objective Blood pressure 142/84, pulse 94, height 5\' 6"  (1.676 m), weight 270 lb (122.471 kg), SpO2 96 %.  General: No apparent distress alert and oriented x3 mood and affect normal, dressed appropriately.  HEENT: Pupils equal, extraocular movements intact  Respiratory: Patient's speak in full sentences and does not appear short of breath  Cardiovascular: +1 lower extremity edema, non tender, no erythema  Skin: Warm dry intact with no signs of infection or rash on extremities or on axial skeleton.  Abdomen: Soft nontender  Neuro: Cranial nerves II through XII are intact, neurovascularly intact in all extremities with 2+ DTRs and 2+ pulses.  Lymph: +1 effusion Gait normal with good balance and coordination.  MSK:  Non tender with full range of motion and good stability and symmetric  strength and tone of shoulders, elbows, wrist, hip, and ankles bilaterally.  Knee: Left Mild warm to touch +1 pitting edema of the lower extremities Bilateral medial joint line tenderness severe and wose then previous. Patient is having significantly worse pain on the left knee today. ROM full in flexion and extension and lower leg rotation. Mild instability of the knee Positive Mcmurray's, Apley's, and Thessalonian tests left side. painful patellar compression. Patellar glide with moderate crepitus. Patellar and quadriceps tendons unremarkable. Hamstring and quadriceps strength is normal.  Right knee has pain on the medial joint line as well       Impression and Recommendations:     This case required medical decision making of moderate complexity.

## 2015-01-31 NOTE — Patient Instructions (Addendum)
Good to see you as always.  Lasix 20 mg daily until fluid off then as needed Meloxicam daily for next 10 days and see how you are doing.  Ice when you need it We will gte you in a brace as well and they will call you [facility]! See me again in 2-3 weeks.

## 2015-02-01 ENCOUNTER — Encounter: Payer: Self-pay | Admitting: Family Medicine

## 2015-02-01 LAB — ANTI-DNA ANTIBODY, DOUBLE-STRANDED: DS DNA AB: 1 [IU]/mL

## 2015-02-01 LAB — CYCLIC CITRUL PEPTIDE ANTIBODY, IGG

## 2015-02-01 LAB — ANA: Anti Nuclear Antibody(ANA): NEGATIVE

## 2015-02-01 NOTE — Assessment & Plan Note (Signed)
Patient did have surgery and did respond well initially but unfortunately is having the same pain again. We do know that there is underlying arthritis that is contribute in. I do feel that lambs are necessary to rule out any autoimmune diseases that could be contributing. I believe the patient's pain today though is more secondary to the lower extremity edema hopefully. Patient given a water pill to do daily until the fluid is off. We discussed icing regimen and home exercises. No signs of a clot at this time. Seems to be more like dependent edema. Patient will try to make these changes and see me again in 2-3 weeks. Continuing to have difficulty or worsening symptoms patient will call earlier.

## 2015-02-28 ENCOUNTER — Encounter: Payer: Self-pay | Admitting: Family Medicine

## 2015-02-28 ENCOUNTER — Ambulatory Visit (INDEPENDENT_AMBULATORY_CARE_PROVIDER_SITE_OTHER): Payer: BLUE CROSS/BLUE SHIELD | Admitting: Family Medicine

## 2015-02-28 VITALS — BP 124/84 | HR 98 | Ht 66.0 in | Wt 268.0 lb

## 2015-02-28 DIAGNOSIS — M1712 Unilateral primary osteoarthritis, left knee: Secondary | ICD-10-CM | POA: Diagnosis not present

## 2015-02-28 NOTE — Patient Instructions (Signed)
Good to see you Colchicine 2 times daily for 3 days then daily for the rest of the bottle. Call me or email and tell me if it works.  Continue what you are doing  See me again when you need me. I am here for questions.

## 2015-02-28 NOTE — Progress Notes (Signed)
Pre visit review using our clinic review tool, if applicable. No additional management support is needed unless otherwise documented below in the visit note. 

## 2015-02-28 NOTE — Progress Notes (Signed)
  [name], [name] Phone: [phone] Subjective:     CC: Bilateral knee painfollow-up  [name] is a 49 y.o. male coming in with complaint of bilateral knee pain. ` Patient was last seen over 5 months ago for a left meniscal tear. Patient did have arthroscopic procedure done on this knee.  Was doing better with started having increasing pain again. Patient was also having swelling. Patient was put on the diaphoretic. Patient held on having steroid injections at last follow-up because he had one by his other provider. Patient states continues to have pain at this time and they are discussing the possibility of knee replacements. Patient would like to avoid this as long as possible. Patient is wearing braces on a regular basis. 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 Patient is continued try to do the icing. Continues to try to monitor his diet but is difficult. Not taking anything for pain medication really at this time.  Review of Systems: No headache, visual changes, nausea, vomiting, diarrhea, constipation, dizziness, abdominal pain, skin rash, fevers, chills, night sweats, weight loss, swollen lymph nodes, body aches, joint swelling, muscle aches, chest pain, shortness of breath, mood changes.   Objective Blood pressure 124/84, pulse 98, height 5\' 6"  (1.676 m), weight 268 lb (121.564 kg), SpO2 95 %.  General: No apparent distress alert and oriented x3 mood and affect normal, dressed appropriately.  HEENT: Pupils equal, extraocular movements intact  Respiratory: Patient's speak in full sentences and does not appear short of breath  Cardiovascular: +1 lower extremity edema, non tender, no erythema  Skin: Warm dry intact with no signs of infection or rash on extremities or on axial skeleton.  Abdomen: Soft nontender  Neuro: Cranial nerves II through XII are intact,  neurovascularly intact in all extremities with 2+ DTRs and 2+ pulses.  Lymph: +1 effusion Gait normal with good balance and coordination.  MSK:  Non tender with full range of motion and good stability and symmetric strength and tone of shoulders, elbows, wrist, hip, and ankles bilaterally.  Knee: Left Mild warm to touch +1 pitting edema of the lower extremities Bilateral medial joint line tenderness severe and wose then previous. Patient is having significantly worse pain on the left knee today. ROM full in flexion and extension and lower leg rotation. Mild instability of the knee Positive Mcmurray's, Apley's, and Thessalonian tests left side. painful patellar compression. Patellar glide with moderate crepitus. Patellar and quadriceps tendons unremarkable. Hamstring and quadriceps strength is normal.  Right knee has pain on the medial joint line as well       Impression and Recommendations:     This case required medical decision making of moderate complexity.

## 2015-02-28 NOTE — Assessment & Plan Note (Signed)
Discussed with patient again at great length. Patient continues to have significant difficulty. Patient does have known moderate to severe osteophytic changes. We discussed with patient about different treatment options which patient has exhausted all other conservative therapy at this time. Patient is young but I do think that surgical intervention could be necessary in the near future. We will do one trial of a gout medication to see if this will be beneficial. This could be some secondary to a crystal arthropathy. Patient does not make any significant improvement with this medication and then his options have been exhausted. Discussed with them though he can come back for any other injections to give him temporizing measures.  Spent  25 minutes with patient face-to-face and had greater than 50% of counseling including as described above in assessment and plan.

## 2015-03-27 LAB — HEPATIC FUNCTION PANEL
ALT: 38 U/L (ref 10–40)
AST: 26 U/L (ref 14–40)
Alkaline Phosphatase: 53 U/L (ref 25–125)
Bilirubin, Direct: 0.11 mg/dL (ref 0.01–0.4)
Bilirubin, Total: 0.4 mg/dL

## 2015-03-27 LAB — LIPID PANEL
CHOLESTEROL: 166 mg/dL (ref 0–200)
HDL: 39 mg/dL (ref 35–70)
LDL Cholesterol: 113 mg/dL
Triglycerides: 68 mg/dL (ref 40–160)

## 2015-03-27 LAB — CBC AND DIFFERENTIAL
HCT: 46 % (ref 41–53)
Hemoglobin: 15.3 g/dL (ref 13.5–17.5)
Platelets: 303 10*3/uL (ref 150–399)
WBC: 8.7 10*3/mL

## 2015-03-27 LAB — BASIC METABOLIC PANEL
BUN: 9 mg/dL (ref 4–21)
CREATININE: 0.9 mg/dL (ref 0.6–1.3)
Glucose: 86 mg/dL
Potassium: 4.2 mmol/L (ref 3.4–5.3)
Sodium: 141 mmol/L (ref 137–147)

## 2015-04-05 ENCOUNTER — Ambulatory Visit (INDEPENDENT_AMBULATORY_CARE_PROVIDER_SITE_OTHER): Payer: BLUE CROSS/BLUE SHIELD | Admitting: Internal Medicine

## 2015-04-05 VITALS — BP 134/82 | HR 86 | Temp 97.5°F | Ht 66.0 in | Wt 267.2 lb

## 2015-04-05 DIAGNOSIS — N529 Male erectile dysfunction, unspecified: Secondary | ICD-10-CM

## 2015-04-05 DIAGNOSIS — E785 Hyperlipidemia, unspecified: Secondary | ICD-10-CM | POA: Diagnosis not present

## 2015-04-05 DIAGNOSIS — R7303 Prediabetes: Secondary | ICD-10-CM | POA: Diagnosis not present

## 2015-04-05 NOTE — Progress Notes (Signed)
   Subjective:    Patient ID: CAELEN REIERSON, male    DOB: May 07, 1966, 49 y.o.   MRN: [id number]  HPI   Wellness labs were done at his place of employment. His LDL was 113; HDL 39; and A1c 6.2%. He was concerned about long-term risks.  His NMR LipoProfile has revealed his LDL goal is less than 120; ideally less than 90.  He is not on a heart healthy diet. He is unable to exercise due to chronic knee issues.  He continues to have intermittent atypical chest pain. This has been evaluated in detail by Cardiology. He also has intermittent edema of the lower extremities.  Review of Systems  Palpitations, tachycardia, exertional dyspnea, paroxysmal nocturnal dyspnea, or claudication are absent.  ED persists; he requests Urology evaluation.      Objective:   Physical Exam  Pertinent or positive findings include:BMI 43.16. Both knees in soft braces. Multiple tatooes.  General appearance :adequately nourished; in no distress.  Eyes: No conjunctival inflammation or scleral icterus is present.  Oral exam:  Lips and gums are healthy appearing.There is no oropharyngeal erythema or exudate noted. Dental hygiene is good.  Heart:  Normal rate and regular rhythm. S1 and S2 normal without gallop, murmur, click, rub or other extra sounds    Lungs:Chest clear to auscultation; no wheezes, rhonchi,rales ,or rubs present.No increased work of breathing.   Abdomen: bowel sounds normal, soft and non-tender without masses, organomegaly or hernias noted.  No guarding or rebound.   Vascular : all pulses equal ; no bruits present.  Skin:Warm & dry.  Intact without suspicious lesions or rashes ; no tenting   Lymphatic: No lymphadenopathy is noted about the head, neck, axilla.   Neuro: Strength, tone  normal.     Assessment & Plan:  #1 dyslipidemia; LDL is at goal  #2 prediabetes; nutritional interventions discussed  #3 ED  Plan: See orders and after visit summary

## 2015-04-05 NOTE — Patient Instructions (Signed)
Risk of premature heart attack or stroke increases as LDL or BAD cholesterol rises, especially if there is family history of heart attack in males before 33 or women before 4. Based on your advanced testing, your LDL goal is < 120 , ideally < 90. Your present LDLof 113 does not increase long term heart attack or stroke risk .  Please follow a Mediaterranean type diet  (many good cook books readily available) or review Dr Nunzio Cory book Eat, [place] for best  dietary cholesterol information & options. Cardiovascular exercise, this can be as simple a program as water aerobics, is recommended 30-45 minutes 3-4 times per week. If you're not exercising you should take 6-8 weeks to build up to this level.   The following nutritional changes may help prevent Diabetes progression & complications.  White carbohydrates (potatoes, rice, bread, and pasta) cause a high spike of the sugar level which stays elevated for a significant period of time (called sugar"load").  For example a  baked potato has a cup of sugar and a  french fry  2 teaspoons of sugar.  More complex carbs such as yams, wild  rice, whole grained bread &  wheat pasta have been much lower spike and persistent load of sugar than the white carbs. The pancreas excretes excess insulin in response to the high spike & load of sugar . Over time the pancreas can actually run out of insulin necessitating insulin shots.

## 2015-04-12 ENCOUNTER — Encounter: Payer: Self-pay | Admitting: Internal Medicine

## 2015-05-25 ENCOUNTER — Other Ambulatory Visit: Payer: Self-pay | Admitting: Internal Medicine

## 2015-05-28 ENCOUNTER — Other Ambulatory Visit: Payer: Self-pay | Admitting: Internal Medicine

## 2015-06-11 ENCOUNTER — Ambulatory Visit: Payer: BLUE CROSS/BLUE SHIELD | Admitting: Family

## 2015-06-15 ENCOUNTER — Ambulatory Visit (INDEPENDENT_AMBULATORY_CARE_PROVIDER_SITE_OTHER): Payer: BLUE CROSS/BLUE SHIELD | Admitting: Family

## 2015-06-15 ENCOUNTER — Encounter: Payer: Self-pay | Admitting: Family

## 2015-06-15 VITALS — BP 128/88 | HR 89 | Temp 97.8°F | Resp 16 | Ht 66.0 in | Wt 260.4 lb

## 2015-06-15 DIAGNOSIS — M1712 Unilateral primary osteoarthritis, left knee: Secondary | ICD-10-CM | POA: Diagnosis not present

## 2015-06-15 MED ORDER — DICLOFENAC SODIUM 2 % TD SOLN: 1.0000 "application " | Freq: Two times a day (BID) | TRANSDERMAL | Status: DC

## 2015-06-15 NOTE — Assessment & Plan Note (Signed)
Symptoms and exam consistent with osteoarthritis. Has had multiple surgeries including a meniscectomy and an arthroscopy which have not helped greatly. He has failed several conservative treatments including physical therapy, cortisone injections, and anti-inflammatories. Symptoms have continued to worsen. Discussed next treatment possibly for joint replacement and encourage follow-up with Dr. Berenice Primas. Start vitamin D. Start Pennsaid. Encourage ice multiple times per day and at night. Follow up for additional refills of medication. Unlikely additional conservative treatment will help.

## 2015-06-15 NOTE — Progress Notes (Signed)
Pre visit review using our clinic review tool, if applicable. No additional management support is needed unless otherwise documented below in the visit note. 

## 2015-06-15 NOTE — Patient Instructions (Addendum)
Thank you for choosing [facility].  Summary/Instructions:  Please start vitamin D 2000 IU daily.  Start Pennsaid 2x daily with about pinky sized dose and rub into knee.  Follow up with Dr. Berenice Primas  Follow up for a physical starting in about 1 month.   Your prescription(s) have been submitted to your pharmacy or been printed and provided for you. Please take as directed and contact our office if you believe you are having problem(s) with the medication(s) or have any questions.  If your symptoms worsen or fail to improve, please contact our office for further instruction, or in case of emergency go directly to the emergency room at the closest medical facility.    Osteoarthritis Osteoarthritis is a disease that causes soreness and inflammation of a joint. It occurs when the cartilage at the affected joint wears down. Cartilage acts as a cushion, covering the ends of bones where they meet to form a joint. Osteoarthritis is the most common form of arthritis. It often occurs in older people. The joints affected most often by this condition include those in the:  Ends of the fingers.  Thumbs.  Neck.  Lower back.  Knees.  Hips. CAUSES  Over time, the cartilage that covers the ends of bones begins to wear away. This causes bone to rub on bone, producing pain and stiffness in the affected joints.  RISK FACTORS Certain factors can increase your chances of having osteoarthritis, including:  Older age.  Excessive body weight.  Overuse of joints.  Previous joint injury. SIGNS AND SYMPTOMS   Pain, swelling, and stiffness in the joint.  Over time, the joint may lose its normal shape.  Small deposits of bone (osteophytes) may grow on the edges of the joint.  Bits of bone or cartilage can break off and float inside the joint space. This may cause more pain and damage. DIAGNOSIS  Your health care provider will do a physical exam and ask about your symptoms. Various tests  may be ordered, such as:  X-rays of the affected joint.  Blood tests to rule out other types of arthritis. Additional tests may be used to diagnose your condition. TREATMENT  Goals of treatment are to control pain and improve joint function. Treatment plans may include:  A prescribed exercise program that allows for rest and joint relief.  A weight control plan.  Pain relief techniques, such as:  Properly applied heat and cold.  Electric pulses delivered to nerve endings under the skin (transcutaneous electrical nerve stimulation [TENS]).  Massage.  Certain nutritional supplements.  Medicines to control pain, such as:  Acetaminophen.  Nonsteroidal anti-inflammatory drugs (NSAIDs), such as naproxen.  Narcotic or central-acting agents, such as tramadol.  Corticosteroids. These can be given orally or as an injection.  Surgery to reposition the bones and relieve pain (osteotomy) or to remove loose pieces of bone and cartilage. Joint replacement may be needed in advanced states of osteoarthritis. HOME CARE INSTRUCTIONS   Take medicines only as directed by your health care provider.  Maintain a healthy weight. Follow your health care provider's instructions for weight control. This may include dietary instructions.  Exercise as directed. Your health care provider can recommend specific types of exercise. These may include:  Strengthening exercises. These are done to strengthen the muscles that support joints affected by arthritis. They can be performed with weights or with exercise bands to add resistance.  Aerobic activities. These are exercises, such as brisk walking or low-impact aerobics, that get your heart pumping.  Range-of-motion activities. These keep your joints limber.  Balance and agility exercises. These help you maintain daily living skills.  Rest your affected joints as directed by your health care provider.  Keep all follow-up visits as directed by your  health care provider. SEEK MEDICAL CARE IF:   Your skin turns red.  You develop a rash in addition to your joint pain.  You have worsening joint pain.  You have a fever along with joint or muscle aches. SEEK IMMEDIATE MEDICAL CARE IF:  You have a significant loss of weight or appetite.  You have night sweats. [place] of Arthritis and Musculoskeletal and Skin Diseases: www.niams.SouthExposed.es  [employer] on Aging: http://kim-miller.com/  American College of Rheumatology: www.rheumatology.org   This information is not intended to replace advice given to you by your health care provider. Make sure you discuss any questions you have with your health care provider.   Document Released: 05/19/2005 Document Revised: 06/09/2014 Document Reviewed: 01/24/2013 Elsevier Interactive Patient Education [employer].

## 2015-06-15 NOTE — Progress Notes (Signed)
Subjective:    Patient ID: [name], male    DOB: [birth year], 50 y.o.   MRN: [id number]  Chief Complaint  Patient presents with  . Establish Care    states that he is having issues with his left leg and sometimes his right    HPI:  [name] is a 50 y.o. male who  has a past medical history of Cluster headache; Rotator cuff arthropathy (2007); DVT of lower extremity (deep venous thrombosis) ([place]) (6/26-28/2013); Saddle pulmonary embolus ([place]) (6/26-28/2013); Irritability and anger; Sleep apnea (5/15); and HTN (hypertension). and presents today for an office visit.   Associated symptom of pain and swelling located in his left knee has been going on for about a year. Pain is described as sharp and achy that is constant. Severity of the pain is about a 5/10 with an average of about 8/10. Modifying factors include sitting which does not help with pain and also OTC tylenol and ibuprofen which also have not helped. He has a knee arthroscopy with medial menisectomy and chonrdoplasty patella and medial plica excision performed. He has also been seen by Sports Medicine and diagnosed with Osteoarthritis with potential for no conservative interventions remaining. Does have the occasional popping sensation.  Has been on Advil or meloxicam which has not helped.    No Known Allergies   Current Outpatient Prescriptions on File Prior to Visit  Medication Sig Dispense Refill  . Multiple Vitamins-Minerals (MULTIVITAMIN WITH MINERALS) tablet Take 1 tablet by mouth daily.    [name] oxyCODONE-acetaminophen (PERCOCET/ROXICET) 5-325 MG per tablet Take 1-2 tablets by mouth every 6 (six) hours as needed for severe pain. 40 tablet 0  . sildenafil (REVATIO) 20 MG tablet TAKE 3 TABLETS BY MOUTH DAILY AS NEEDED 50 tablet 1  . sucralfate (CARAFATE) 1 G tablet Dissolve 1 tablet of generic Carafate in 5 cc of water Take before meals and at bedtime 60 tablet 0   No current facility-administered medications  on file prior to visit.    Past Medical History  Diagnosis Date  . Cluster headache   . Rotator cuff arthropathy 2007    right  . DVT of lower extremity (deep venous thrombosis) ([place]) 6/26-28/2013    Xarelto   . Saddle pulmonary embolus ([place]) 6/26-28/2013    post orthopedic surgery  . Irritability and anger     PMH of  . Sleep apnea 5/15    moderate OSA-has cpap  . HTN (hypertension)     no meds    Review of Systems  Constitutional: Negative for fever and chills.  Musculoskeletal:       Positive for knee pain.   Neurological: Negative for weakness and numbness.      Objective:    BP 128/88 mmHg  Pulse 89  Temp(Src) 97.8 F (36.6 C) (Oral)  Resp 16  Ht 5\' 6"  (1.676 m)  Wt 260 lb 6.4 oz (118.117 kg)  BMI 42.05 kg/m2  SpO2 91% Nursing note and vital signs reviewed.  Physical Exam  Constitutional: He is oriented to person, place, and time. He appears well-developed and well-nourished. No distress.  Cardiovascular: Normal rate, regular rhythm, normal heart sounds and intact distal pulses.   Pulmonary/Chest: Effort normal and breath sounds normal.  Musculoskeletal:  Left knee - mild/moderate edema with no deformity or discoloration. Displays full range of motion. There is generalized tenderness along both joint lines and anterior knee. Strength is 3-4+. Distal pulses, sensation, and reflexes are intact and appropriate. Ligamentous testing is  negative.  Neurological: He is alert and oriented to person, place, and time.  Skin: Skin is warm and dry.  Psychiatric: He has a normal mood and affect. His behavior is normal. Judgment and thought content normal.       Assessment & Plan:   Problem List Items Addressed This Visit      Musculoskeletal and Integument   Osteoarthritis of left knee - Primary    Symptoms and exam consistent with osteoarthritis. Has had multiple surgeries including a meniscectomy and an arthroscopy which have not helped greatly. He has failed  several conservative treatments including physical therapy, cortisone injections, and anti-inflammatories. Symptoms have continued to worsen. Discussed next treatment possibly for joint replacement and encourage follow-up with Dr. Berenice Primas. Start vitamin D. Start Pennsaid. Encourage ice multiple times per day and at night. Follow up for additional refills of medication. Unlikely additional conservative treatment will help.       Relevant Medications   Diclofenac Sodium (PENNSAID) 2 % SOLN

## 2015-07-25 ENCOUNTER — Ambulatory Visit: Payer: BLUE CROSS/BLUE SHIELD | Admitting: Family

## 2015-08-02 ENCOUNTER — Ambulatory Visit: Payer: BLUE CROSS/BLUE SHIELD | Admitting: Family

## 2015-08-16 ENCOUNTER — Other Ambulatory Visit: Payer: Self-pay | Admitting: Internal Medicine

## 2015-08-21 ENCOUNTER — Ambulatory Visit (INDEPENDENT_AMBULATORY_CARE_PROVIDER_SITE_OTHER): Payer: BLUE CROSS/BLUE SHIELD | Admitting: Family

## 2015-08-21 ENCOUNTER — Encounter: Payer: Self-pay | Admitting: Family

## 2015-08-21 ENCOUNTER — Other Ambulatory Visit (INDEPENDENT_AMBULATORY_CARE_PROVIDER_SITE_OTHER): Payer: BLUE CROSS/BLUE SHIELD

## 2015-08-21 VITALS — BP 118/88 | HR 67 | Temp 97.8°F | Resp 16 | Ht 66.0 in | Wt 249.0 lb

## 2015-08-21 DIAGNOSIS — Z791 Long term (current) use of non-steroidal anti-inflammatories (NSAID): Secondary | ICD-10-CM | POA: Insufficient documentation

## 2015-08-21 DIAGNOSIS — Z0000 Encounter for general adult medical examination without abnormal findings: Secondary | ICD-10-CM

## 2015-08-21 LAB — COMPREHENSIVE METABOLIC PANEL
ALK PHOS: 51 U/L (ref 39–117)
ALT: 24 U/L (ref 0–53)
AST: 17 U/L (ref 0–37)
Albumin: 4.2 g/dL (ref 3.5–5.2)
BILIRUBIN TOTAL: 0.5 mg/dL (ref 0.2–1.2)
BUN: 11 mg/dL (ref 6–23)
CALCIUM: 9.5 mg/dL (ref 8.4–10.5)
CO2: 28 mEq/L (ref 19–32)
CREATININE: 0.91 mg/dL (ref 0.40–1.50)
Chloride: 106 mEq/L (ref 96–112)
GFR: 113.47 mL/min (ref >60.00)
Glucose, Bld: 92 mg/dL (ref 70–99)
Potassium: 4.3 mEq/L (ref 3.5–5.1)
Sodium: 141 mEq/L (ref 135–145)
TOTAL PROTEIN: 7.1 g/dL (ref 6.0–8.3)

## 2015-08-21 LAB — CBC
HCT: 46.3 % (ref 39.0–52.0)
HEMOGLOBIN: 15.5 g/dL (ref 13.0–17.0)
MCHC: 33.5 g/dL (ref 30.0–36.0)
MCV: 86 fl (ref 78.0–100.0)
PLATELETS: 262 10*3/uL (ref 150.0–400.0)
RBC: 5.38 Mil/uL (ref 4.22–5.81)
RDW: 13.7 % (ref 11.5–15.5)
WBC: 9.3 10*3/uL (ref 4.0–10.5)

## 2015-08-21 LAB — LIPID PANEL
CHOLESTEROL: 153 mg/dL (ref 0–200)
HDL: 41.2 mg/dL (ref >39.00)
LDL Cholesterol: 99 mg/dL (ref 0–99)
NonHDL: 112.27
TRIGLYCERIDES: 64 mg/dL (ref 0.0–149.0)
Total CHOL/HDL Ratio: 4
VLDL: 12.8 mg/dL (ref 0.0–40.0)

## 2015-08-21 LAB — HEMOGLOBIN A1C: HEMOGLOBIN A1C: 6.2 % (ref 4.6–6.5)

## 2015-08-21 LAB — PSA: PSA: 0.38 ng/mL (ref 0.10–4.00)

## 2015-08-21 LAB — TSH: TSH: 1.1 u[IU]/mL (ref 0.35–4.50)

## 2015-08-21 NOTE — Progress Notes (Signed)
Pre visit review using our clinic review tool, if applicable. No additional management support is needed unless otherwise documented below in the visit note. 

## 2015-08-21 NOTE — Progress Notes (Signed)
Subjective:    Patient ID: [name], male    DOB: November 22, 1965, 50 y.o.   MRN: [id number]  Chief Complaint  Patient presents with  . CPE    Fasting, A1c    HPI:  [name] is a 50 y.o. male who presents today for an annual wellness visit.   1) Health Maintenance -   Diet - Averages about 1-2 meals per day consisting of minimal fruits and vegetables; mainly chicken and pork; Caffeine intake is minimal   Exercise - Working to get back into exercise - no structured currently   2) Preventative Exams / Immunizations:  Dental -- Due for exam  Vision -- Up to date   Health Maintenance  Topic Date Due  . HIV Screening  10/03/1980  . INFLUENZA VACCINE  04/09/2016 (Originally 01/01/2015)  . TETANUS/TDAP  06/17/2021    Immunization History  Administered Date(s) Administered  . Influenza Whole 03/02/2012  . Influenza-Unspecified 03/10/2013  . Tdap 06/18/2011   No Known Allergies   Outpatient Prescriptions Prior to Visit  Medication Sig Dispense Refill  . Diclofenac Sodium (PENNSAID) 2 % SOLN Place 1 application onto the skin 2 (two) times daily. 112 g 0  . Multiple Vitamins-Minerals (MULTIVITAMIN WITH MINERALS) tablet Take 1 tablet by mouth daily.    [name] oxyCODONE-acetaminophen (PERCOCET/ROXICET) 5-325 MG per tablet Take 1-2 tablets by mouth every 6 (six) hours as needed for severe pain. 40 tablet 0  . sildenafil (REVATIO) 20 MG tablet TAKE 3 TABLETS BY MOUTH DAILY AS NEEDED 50 tablet 1  . sucralfate (CARAFATE) 1 G tablet Dissolve 1 tablet of generic Carafate in 5 cc of water Take before meals and at bedtime 60 tablet 0   No facility-administered medications prior to visit.     Past Medical History  Diagnosis Date  . Cluster headache   . Rotator cuff arthropathy 2007    right  . DVT of lower extremity (deep venous thrombosis) ([facility]) 6/26-28/2013    Xarelto   . Saddle pulmonary embolus ([facility]) 6/26-28/2013    post orthopedic surgery  . Irritability and  anger     PMH of  . Sleep apnea 5/15    moderate OSA-has cpap  . HTN (hypertension)     no meds     Past Surgical History  Procedure Laterality Date  . Wisdom tooth extraction    . Cardiac catheterization  2007    LVdysfunction; normal coronaries  . Upper gastrointestinal endoscopy  2007  . Arthroscopic knee surgery  10/09/2011    Dr Ninfa Linden  . Rotator cuff repair  2007    right  . Knee arthroscopy with medial menisectomy Left 09/20/2014    medial menisectomy chondroplastytella medial plica excision  ;  Surgeon: Dorna Leitz, MD;  Location: [facility];  Service: Orthopedics;  Laterality: Left;     Family History  Problem Relation Age of Onset  . Cancer Mother     Mesothelioma   . Diabetes Brother   . Hypertension Brother   . Stroke Brother 79  . Diabetes Maternal Aunt   . Diabetes Maternal Grandmother   . Heart disease Maternal Grandmother   . Hypertension Maternal Grandmother   . Stroke Maternal Grandmother     in 32s  . Stroke Maternal Grandfather     > 55  . Migraines Brother   . Deep vein thrombosis Maternal Grandmother   . Heart attack Brother 85  . Heart attack Brother 52     Social  History   Social History  . Marital Status: Single    Spouse Name: N/A  . Number of Children: 6  . Years of Education: 11   Occupational History  . Glass blower/designer    . [employer]    Social History Main Topics  . Smoking status: Never Smoker   . Smokeless tobacco: Never Used  . Alcohol Use: Yes     Comment: occ  . Drug Use: No  . Sexual Activity: Not on file   Other Topics Concern  . Not on file   Social History Narrative   Fun: Exercise      Review of Systems  Constitutional: Denies fever, chills, fatigue, or significant weight gain/loss. HENT: Head: Denies headache or neck pain Ears: Denies changes in hearing, ringing in ears, earache, drainage Nose: Denies discharge, stuffiness, itching, nosebleed, sinus pain Throat: Denies sore  throat, hoarseness, dry mouth, sores, thrush Eyes: Denies loss/changes in vision, pain, redness, blurry/double vision, flashing lights Cardiovascular: Denies chest pain/discomfort, tightness, palpitations, shortness of breath with activity, difficulty lying down, swelling, sudden awakening with shortness of breath Respiratory: Denies shortness of breath, cough, sputum production, wheezing Gastrointestinal: Denies dysphasia, heartburn, change in appetite, nausea, change in bowel habits, rectal bleeding, constipation, diarrhea, yellow skin or eyes Genitourinary: Denies frequency, urgency, burning/pain, blood in urine, incontinence, change in urinary strength. Musculoskeletal: Denies muscle/joint pain, stiffness, back pain, redness or swelling of joints, trauma Skin: Denies rashes, lumps, itching, dryness, color changes, or hair/nail changes Neurological: Denies dizziness, fainting, seizures, weakness, numbness, tingling, tremor Psychiatric - Denies nervousness, stress, depression or memory loss Endocrine: Denies heat or cold intolerance, sweating, frequent urination, excessive thirst, changes in appetite Hematologic: Denies ease of bruising or bleeding     Objective:     BP 118/88 mmHg  Pulse 67  Temp(Src) 97.8 F (36.6 C) (Oral)  Resp 16  Ht 5\' 6"  (1.676 m)  Wt 249 lb (112.946 kg)  BMI 40.21 kg/m2  SpO2 99% Nursing note and vital signs reviewed.  Physical Exam  Constitutional: He is oriented to person, place, and time. He appears well-developed and well-nourished.  HENT:  Head: Normocephalic.  Right Ear: Hearing, tympanic membrane, external ear and ear canal normal.  Left Ear: Hearing, tympanic membrane, external ear and ear canal normal.  Nose: Nose normal.  Mouth/Throat: Uvula is midline, oropharynx is clear and moist and mucous membranes are normal.  Eyes: Conjunctivae and EOM are normal. Pupils are equal, round, and reactive to light.  Neck: Neck supple. No JVD present. No  tracheal deviation present. No thyromegaly present.  Cardiovascular: Normal rate, regular rhythm, normal heart sounds and intact distal pulses.   Pulmonary/Chest: Effort normal and breath sounds normal.  Abdominal: Soft. Bowel sounds are normal. He exhibits no distension and no mass. There is no tenderness. There is no rebound and no guarding.  Musculoskeletal: Normal range of motion. He exhibits no edema or tenderness.  Lymphadenopathy:    He has no cervical adenopathy.  Neurological: He is alert and oriented to person, place, and time. He has normal reflexes. No cranial nerve deficit. He exhibits normal muscle tone. Coordination normal.  Skin: Skin is warm and dry.  Psychiatric: He has a normal mood and affect. His behavior is normal. Judgment and thought content normal.       Assessment & Plan:   Problem List Items Addressed This Visit      Other   Routine general medical examination at a health care facility - Primary  1) Anticipatory Guidance: Discussed importance of wearing a seatbelt while driving and not texting while driving; changing batteries in smoke detector at least once annually; wearing suntan lotion when outside; eating a balanced and moderate diet; getting physical activity at least 30 minutes per day.  2) Immunizations / Screenings / Labs:  Declines influenza. All other immunizations are up-to-date per recommendations. Due for a dental screen encouraged to be completed independently. Obtain PSA for prostate cancer screening. Obtain A1c for diabetic screening. All other screenings are up-to-date per recommendations. Note and Will be due for colonoscopy in 2 months as he turns 50 years of age. Obtain CBC, BMET, Lipid profile and TSH.   Overall well exam with risk factors for cardiovascular disease including hyperlipidemia, hypertension, obstructive sleep apnea, and obesity. Blood pressure is currently managed with lifestyle. Hyperlipidemia status will be determined lipid  profile with blood work. Encouraged increasing physical activity with goal of 30 minutes of moderate level activity daily. Nutritional intake recommended that is moderate, balance, and varied and focused on nutrient dense foods and is low in saturated fats and processed/sugary foods. Continue other healthy lifestyle behaviors and choices. Recommended weight loss goal approximate 5-10% of current body weight. Follow-up prevention exam in 1 year. Follow-up office visit pending blood work and for chronic conditions.      Relevant Orders   CBC (Completed)   Comprehensive metabolic panel (Completed)   Lipid panel (Completed)   TSH (Completed)   Hemoglobin A1c (Completed)   PSA (Completed)

## 2015-08-21 NOTE — Assessment & Plan Note (Signed)
1) Anticipatory Guidance: Discussed importance of wearing a seatbelt while driving and not texting while driving; changing batteries in smoke detector at least once annually; wearing suntan lotion when outside; eating a balanced and moderate diet; getting physical activity at least 30 minutes per day.  2) Immunizations / Screenings / Labs:  Declines influenza. All other immunizations are up-to-date per recommendations. Due for a dental screen encouraged to be completed independently. Obtain PSA for prostate cancer screening. Obtain A1c for diabetic screening. All other screenings are up-to-date per recommendations. Note and Will be due for colonoscopy in 2 months as he turns 50 years of age. Obtain CBC, BMET, Lipid profile and TSH.   Overall well exam with risk factors for cardiovascular disease including hyperlipidemia, hypertension, obstructive sleep apnea, and obesity. Blood pressure is currently managed with lifestyle. Hyperlipidemia status will be determined lipid profile with blood work. Encouraged increasing physical activity with goal of 30 minutes of moderate level activity daily. Nutritional intake recommended that is moderate, balance, and varied and focused on nutrient dense foods and is low in saturated fats and processed/sugary foods. Continue other healthy lifestyle behaviors and choices. Recommended weight loss goal approximate 5-10% of current body weight. Follow-up prevention exam in 1 year. Follow-up office visit pending blood work and for chronic conditions.

## 2015-08-21 NOTE — Patient Instructions (Addendum)
Thank you for choosing [facility].  Summary/Instructions:  Health Maintenance, Male A healthy lifestyle and preventative care can promote health and wellness.  Maintain regular health, dental, and eye exams.  Eat a healthy diet. Foods like vegetables, fruits, whole grains, low-fat dairy products, and lean protein foods contain the nutrients you need and are low in calories. Decrease your intake of foods high in solid fats, added sugars, and salt. Get information about a proper diet from your health care provider, if necessary.  Regular physical exercise is one of the most important things you can do for your health. Most adults should get at least 150 minutes of moderate-intensity exercise (any activity that increases your heart rate and causes you to sweat) each week. In addition, most adults need muscle-strengthening exercises on 2 or more days a week.   Maintain a healthy weight. The body mass index (BMI) is a screening tool to identify possible weight problems. It provides an estimate of body fat based on height and weight. Your health care provider can find your BMI and can help you achieve or maintain a healthy weight. For males 20 years and older:  A BMI below 18.5 is considered underweight.  A BMI of 18.5 to 24.9 is normal.  A BMI of 25 to 29.9 is considered overweight.  A BMI of 30 and above is considered obese.  Maintain normal blood lipids and cholesterol by exercising and minimizing your intake of saturated fat. Eat a balanced diet with plenty of fruits and vegetables. Blood tests for lipids and cholesterol should begin at age 3 and be repeated every 5 years. If your lipid or cholesterol levels are high, you are over age 29, or you are at high risk for heart disease, you may need your cholesterol levels checked more frequently.Ongoing high lipid and cholesterol levels should be treated with medicines if diet and exercise are not working.  If you smoke, find out from  your health care provider how to quit. If you do not use tobacco, do not start.  Lung cancer screening is recommended for adults aged 83-80 years who are at high risk for developing lung cancer because of a history of smoking. A yearly low-dose CT scan of the lungs is recommended for people who have at least a 30-pack-year history of smoking and are current smokers or have quit within the past 15 years. A pack year of smoking is smoking an average of 1 pack of cigarettes a day for 1 year (for example, a 30-pack-year history of smoking could mean smoking 1 pack a day for 30 years or 2 packs a day for 15 years). Yearly screening should continue until the smoker has stopped smoking for at least 15 years. Yearly screening should be stopped for people who develop a health problem that would prevent them from having lung cancer treatment.  If you choose to drink alcohol, do not have more than 2 drinks per day. One drink is considered to be 12 oz (360 mL) of beer, 5 oz (150 mL) of wine, or 1.5 oz (45 mL) of liquor.  Avoid the use of street drugs. Do not share needles with anyone. Ask for help if you need support or instructions about stopping the use of drugs.  High blood pressure causes heart disease and increases the risk of stroke. High blood pressure is more likely to develop in:  People who have blood pressure in the end of the normal range (100-139/85-89 mm Hg).  People who are overweight  or obese.  People who are African American.  If you are 69-68 years of age, have your blood pressure checked every 3-5 years. If you are 72 years of age or older, have your blood pressure checked every year. You should have your blood pressure measured twice--once when you are at a hospital or clinic, and once when you are not at a hospital or clinic. Record the average of the two measurements. To check your blood pressure when you are not at a hospital or clinic, you can use:  An automated blood pressure machine  at a pharmacy.  A home blood pressure monitor.  If you are 74-71 years old, ask your health care provider if you should take aspirin to prevent heart disease.  Diabetes screening involves taking a blood sample to check your fasting blood sugar level. This should be done once every 3 years after age 44 if you are at a normal weight and without risk factors for diabetes. Testing should be considered at a younger age or be carried out more frequently if you are overweight and have at least 1 risk factor for diabetes.  Colorectal cancer can be detected and often prevented. Most routine colorectal cancer screening begins at the age of 45 and continues through age 59. However, your health care provider may recommend screening at an earlier age if you have risk factors for colon cancer. On a yearly basis, your health care provider may provide home test kits to check for hidden blood in the stool. A small camera at the end of a tube may be used to directly examine the colon (sigmoidoscopy or colonoscopy) to detect the earliest forms of colorectal cancer. Talk to your health care provider about this at age 81 when routine screening begins. A direct exam of the colon should be repeated every 5-10 years through age 77, unless early forms of precancerous polyps or small growths are found.  People who are at an increased risk for hepatitis B should be screened for this virus. You are considered at high risk for hepatitis B if:  You were born in a country where hepatitis B occurs often. Talk with your health care provider about which countries are considered high risk.  Your parents were born in a high-risk country and you have not received a shot to protect against hepatitis B (hepatitis B vaccine).  You have HIV or AIDS.  You use needles to inject street drugs.  You live with, or have sex with, someone who has hepatitis B.  You are a man who has sex with other men (MSM).  You get hemodialysis  treatment.  You take certain medicines for conditions like cancer, organ transplantation, and autoimmune conditions.  Hepatitis C blood testing is recommended for all people born from [birth year] through 1965 and any individual with known risk factors for hepatitis C.  Healthy men should no longer receive prostate-specific antigen (PSA) blood tests as part of routine cancer screening. Talk to your health care provider about prostate cancer screening.  Testicular cancer screening is not recommended for adolescents or adult males who have no symptoms. Screening includes self-exam, a health care provider exam, and other screening tests. Consult with your health care provider about any symptoms you have or any concerns you have about testicular cancer.  Practice safe sex. Use condoms and avoid high-risk sexual practices to reduce the spread of sexually transmitted infections (STIs).  You should be screened for STIs, including gonorrhea and chlamydia if:  You are sexually  active and are younger than 24 years.  You are older than 24 years, and your health care provider tells you that you are at risk for this type of infection.  Your sexual activity has changed since you were last screened, and you are at an increased risk for chlamydia or gonorrhea. Ask your health care provider if you are at risk.  If you are at risk of being infected with HIV, it is recommended that you take a prescription medicine daily to prevent HIV infection. This is called pre-exposure prophylaxis (PrEP). You are considered at risk if:  You are a man who has sex with other men (MSM).  You are a heterosexual man who is sexually active with multiple partners.  You take drugs by injection.  You are sexually active with a partner who has HIV.  Talk with your health care provider about whether you are at high risk of being infected with HIV. If you choose to begin PrEP, you should first be tested for HIV. You should then be tested  every 3 months for as long as you are taking PrEP.  Use sunscreen. Apply sunscreen liberally and repeatedly throughout the day. You should seek shade when your shadow is shorter than you. Protect yourself by wearing long sleeves, pants, a wide-brimmed hat, and sunglasses year round whenever you are outdoors.  Tell your health care provider of new moles or changes in moles, especially if there is a change in shape or color. Also, tell your health care provider if a mole is larger than the size of a pencil eraser.  A one-time screening for abdominal aortic aneurysm (AAA) and surgical repair of large AAAs by ultrasound is recommended for men aged 76-75 years who are current or former smokers.  Stay current with your vaccines (immunizations).   This information is not intended to replace advice given to you by your health care provider. Make sure you discuss any questions you have with your health care provider.   Document Released: 11/15/2007 Document Revised: 06/09/2014 Document Reviewed: 10/14/2010 Elsevier Interactive Patient Education [employer].

## 2015-09-04 ENCOUNTER — Telehealth: Payer: Self-pay | Admitting: Family

## 2015-09-04 NOTE — Telephone Encounter (Signed)
Patient would like the results of his recent blood work.

## 2015-09-04 NOTE — Telephone Encounter (Signed)
Information below was sent to MyChart:   Your blood work shows that your prostate function, thyroid function, liver function, kidney function, electrolytes, and white/red blood cells are within the normal ranges. Your A1c is 6.2 indicating pre-diabetes. No medication is recommended at this time, would recommend focusing on a nutrient intake that is nutrient dense like fruits and vegetables and low in saturated fats and processed/sugary foods. Please let me know if you have any questions. We'll plan to follow-up in one year.

## 2015-09-04 NOTE — Telephone Encounter (Signed)
Please advise 

## 2015-09-05 NOTE — Telephone Encounter (Signed)
Called pt letting him know results and that they were sent to my chart as well.

## 2015-09-21 ENCOUNTER — Telehealth: Payer: Self-pay

## 2015-09-21 NOTE — Telephone Encounter (Signed)
Patient does not feel like him and Dr.Calone have chemistry  He was would like to switch to Dr. Jenny Reichmann  Dr Jenny Reichmann ok with you? Dr. Elna Breslow ok with you?

## 2015-09-24 NOTE — Telephone Encounter (Signed)
Call patient to let them know it was ok to transfer doctors. No answer No VM set up.

## 2015-09-24 NOTE — Telephone Encounter (Signed)
Ok with me,

## 2015-09-24 NOTE — Telephone Encounter (Signed)
Ok with me 

## 2015-09-24 NOTE — Telephone Encounter (Signed)
Please advise, are you able to take on another patient?

## 2015-09-25 NOTE — Telephone Encounter (Signed)
Tried to contact patient. No answer.

## 2015-09-28 ENCOUNTER — Other Ambulatory Visit: Payer: Self-pay | Admitting: Family

## 2015-10-05 ENCOUNTER — Encounter: Payer: Self-pay | Admitting: Internal Medicine

## 2015-10-05 ENCOUNTER — Encounter: Payer: Self-pay | Admitting: Gastroenterology

## 2015-10-05 ENCOUNTER — Ambulatory Visit (INDEPENDENT_AMBULATORY_CARE_PROVIDER_SITE_OTHER): Payer: BLUE CROSS/BLUE SHIELD | Admitting: Internal Medicine

## 2015-10-05 VITALS — BP 136/82 | HR 81 | Temp 97.7°F | Resp 20 | Wt 250.0 lb

## 2015-10-05 DIAGNOSIS — E785 Hyperlipidemia, unspecified: Secondary | ICD-10-CM | POA: Diagnosis not present

## 2015-10-05 DIAGNOSIS — M7989 Other specified soft tissue disorders: Secondary | ICD-10-CM

## 2015-10-05 DIAGNOSIS — I10 Essential (primary) hypertension: Secondary | ICD-10-CM

## 2015-10-05 DIAGNOSIS — R739 Hyperglycemia, unspecified: Secondary | ICD-10-CM

## 2015-10-05 DIAGNOSIS — I872 Venous insufficiency (chronic) (peripheral): Secondary | ICD-10-CM | POA: Insufficient documentation

## 2015-10-05 DIAGNOSIS — Z0189 Encounter for other specified special examinations: Secondary | ICD-10-CM

## 2015-10-05 DIAGNOSIS — Z0000 Encounter for general adult medical examination without abnormal findings: Secondary | ICD-10-CM

## 2015-10-05 DIAGNOSIS — N529 Male erectile dysfunction, unspecified: Secondary | ICD-10-CM

## 2015-10-05 DIAGNOSIS — M25561 Pain in right knee: Secondary | ICD-10-CM

## 2015-10-05 DIAGNOSIS — M25562 Pain in left knee: Secondary | ICD-10-CM

## 2015-10-05 MED ORDER — CELECOXIB 200 MG PO CAPS: 200.0000 mg | ORAL_CAPSULE | Freq: Two times a day (BID) | ORAL | Status: DC | PRN

## 2015-10-05 MED ORDER — ASPIRIN EC 81 MG PO TBEC: 81.0000 mg | DELAYED_RELEASE_TABLET | Freq: Every day | ORAL | Status: DC

## 2015-10-05 MED ORDER — SILDENAFIL CITRATE 20 MG PO TABS: ORAL_TABLET | ORAL | Status: DC

## 2015-10-05 NOTE — Assessment & Plan Note (Signed)
Persistent, for revatio prn,  to f/u any worsening symptoms or concerns

## 2015-10-05 NOTE — Progress Notes (Signed)
Subjective:    Patient ID: [name], male    DOB: [birth year], 50 y.o.   MRN: [id number]  HPI  Here to f/u , overall doing ok,  Pt denies chest pain, increasing sob or doe, wheezing, orthopnea, PND, increased LE swelling, palpitations, dizziness or syncope.  Pt denies new neurological symptoms such as new headache, or facial or extremity weakness or numbness.  Pt denies polydipsia, polyuria, or low sugar episode.   Pt denies new neurological symptoms such as new headache, or facial or extremity weakness or numbness.   Pt states overall good compliance with meds, mostly trying to follow appropriate diet, with wt overall stable,  but little exercise however. Has bilat knee pain, chronic and most days5/10, but sometimes with standing at work for 8 hrs ends up at 10/10.  Has ongoing ED symptoms, no worse but no better, asks for revatio refill as this has helped  Does have persistent trace swelling to the LLE since the DVT.  Not taking ASA 81 mg, and due now for colonoscopy Past Medical History  Diagnosis Date  . Cluster headache   . Rotator cuff arthropathy 2007    right  . DVT of lower extremity (deep venous thrombosis) ([place]) 6/26-28/2013    Xarelto   . Saddle pulmonary embolus ([place]) 6/26-28/2013    post orthopedic surgery  . Irritability and anger     PMH of  . Sleep apnea 5/15    moderate OSA-has cpap  . HTN (hypertension)     no meds   Past Surgical History  Procedure Laterality Date  . Wisdom tooth extraction    . Cardiac catheterization  2007    LVdysfunction; normal coronaries  . Upper gastrointestinal endoscopy  2007  . Arthroscopic knee surgery  10/09/2011    Dr Ninfa Linden  . Rotator cuff repair  2007    right  . Knee arthroscopy with medial menisectomy Left 09/20/2014    medial menisectomy chondroplastytella medial plica excision  ;  Surgeon: Dorna Leitz, MD;  Location: [facility];  Service: Orthopedics;  Laterality: Left;    reports that he has never  smoked. He has never used smokeless tobacco. He reports that he drinks alcohol. He reports that he does not use illicit drugs. family history includes Cancer in his mother; Deep vein thrombosis in his maternal grandmother; Diabetes in his brother, maternal aunt, and maternal grandmother; Heart attack (age of onset: 47) in his brother; Heart attack (age of onset: 52) in his brother; Heart disease in his maternal grandmother; Hypertension in his brother and maternal grandmother; Migraines in his brother; Stroke in his maternal grandfather and maternal grandmother; Stroke (age of onset: 20) in his brother. Allergies  Allergen Reactions  . Tramadol Other (See Comments)    insomnia   Current Outpatient Prescriptions on File Prior to Visit  Medication Sig Dispense Refill  . Diclofenac Sodium (PENNSAID) 2 % SOLN Place 1 application onto the skin 2 (two) times daily. 112 g 0  . Multiple Vitamins-Minerals (MULTIVITAMIN WITH MINERALS) tablet Take 1 tablet by mouth daily.     No current facility-administered medications on file prior to visit.   Review of Systems  Constitutional: Negative for unusual diaphoresis or night sweats HENT: Negative for ear swelling or discharge Eyes: Negative for worsening visual haziness  Respiratory: Negative for choking and stridor.   Gastrointestinal: Negative for distension or worsening eructation Genitourinary: Negative for retention or change in urine volume.  Musculoskeletal: Negative for other MSK pain or  swelling Skin: Negative for color change and worsening wound Neurological: Negative for tremors and numbness other than noted  Psychiatric/Behavioral: Negative for decreased concentration or agitation other than above       Objective:   Physical Exam BP 136/82 mmHg  Pulse 81  Temp(Src) 97.7 F (36.5 C) (Oral)  Resp 20  Wt 250 lb (113.399 kg)  SpO2 94% VS noted, not ill appearing Constitutional: Pt appears in no apparent distress HENT: Head: NCAT.    Right Ear: External ear normal.  Left Ear: External ear normal.  Eyes: . Pupils are equal, round, and reactive to light. Conjunctivae and EOM are normal Neck: Normal range of motion. Neck supple.  Cardiovascular: Normal rate and regular rhythm.   Pulmonary/Chest: Effort normal and breath sounds without rales or wheezing.  Abd:  Soft, NT, ND, + BS Neurological: Pt is alert. Not confused , motor grossly intact Skin: Skin is warm. No rash, no LE edema except trace LLE swelling to knee, no ulcer or erythema Psychiatric: Pt behavior is normal. No agitation.  Bilat knee bony changes without effusion, NT but has crepitus    Assessment & Plan:

## 2015-10-05 NOTE — Progress Notes (Signed)
Pre visit review using our clinic review tool, if applicable. No additional management support is needed unless otherwise documented below in the visit note. 

## 2015-10-05 NOTE — Assessment & Plan Note (Signed)
stable overall by history and exam, recent data reviewed with pt, and pt to continue medical treatment as before,  to f/u any worsening symptoms or concerns Lab Results  Component Value Date   LDLCALC 99 08/21/2015

## 2015-10-05 NOTE — Assessment & Plan Note (Signed)
stable overall by history and exam, recent data reviewed with pt, and pt to continue medical treatment as before,  to f/u any worsening symptoms or concerns / Lab Results  Component Value Date   HGBA1C 6.2 08/21/2015

## 2015-10-05 NOTE — Assessment & Plan Note (Signed)
C/w bilat DJD - for celebrex 200 bid prn, f/u ortho as planned

## 2015-10-05 NOTE — Assessment & Plan Note (Signed)
C/w post phlebitic swelling, To start asa 81 mg post DVT , compression stockings, leg elevation, wt loss, low sodium diet

## 2015-10-05 NOTE — Patient Instructions (Signed)
Please start the "baby aspirin" Aspirin 81 mg - 1 per day (coated) - OTC  Please take all new medication as prescribed - the celebrex as needed for pain  Please consider travel to [facility] for the compression stockings - such as at [facility]   Please continue all other medications as before, and refills have been done if requested - the generic viagra  Please have the pharmacy call with any other refills you may need.  Please continue your efforts at being more active, low cholesterol diet, and weight control.  Please keep your appointments with your specialists as you may have planned  You will be contacted regarding the referral for: colonoscopy  Please call the number on the Website to fix your Password on Mychart

## 2015-10-05 NOTE — Assessment & Plan Note (Addendum)
stable overall by history and exam, recent data reviewed with pt, and pt to continue medical treatment as before,  to f/u any worsening symptoms or concerns BP Readings from Last 3 Encounters:  10/05/15 136/82  08/21/15 118/88  06/15/15 128/88   Note:  Total time for pt hx, exam, review of record with pt in the room, determination of diagnoses and plan for further eval and tx is > 40 min, with over 50% spent in coordination and counseling of patient

## 2015-10-12 ENCOUNTER — Ambulatory Visit (INDEPENDENT_AMBULATORY_CARE_PROVIDER_SITE_OTHER): Payer: BLUE CROSS/BLUE SHIELD

## 2015-10-12 ENCOUNTER — Ambulatory Visit (INDEPENDENT_AMBULATORY_CARE_PROVIDER_SITE_OTHER): Payer: BLUE CROSS/BLUE SHIELD | Admitting: Emergency Medicine

## 2015-10-12 VITALS — BP 124/80 | HR 86 | Temp 98.4°F | Resp 17 | Ht 67.5 in | Wt 252.0 lb

## 2015-10-12 DIAGNOSIS — D72829 Elevated white blood cell count, unspecified: Secondary | ICD-10-CM | POA: Diagnosis not present

## 2015-10-12 DIAGNOSIS — R509 Fever, unspecified: Secondary | ICD-10-CM

## 2015-10-12 DIAGNOSIS — J029 Acute pharyngitis, unspecified: Secondary | ICD-10-CM | POA: Diagnosis not present

## 2015-10-12 DIAGNOSIS — J209 Acute bronchitis, unspecified: Secondary | ICD-10-CM | POA: Diagnosis not present

## 2015-10-12 LAB — POCT CBC
Granulocyte percent: 69.7 %G (ref 37–80)
HEMATOCRIT: 40.6 % — AB (ref 43.5–53.7)
HEMOGLOBIN: 14.5 g/dL (ref 14.1–18.1)
LYMPH, POC: 2.8 (ref 0.6–3.4)
MCH: 30.3 pg (ref 27–31.2)
MCHC: 35.6 g/dL — AB (ref 31.8–35.4)
MCV: 85.1 fL (ref 80–97)
MID (cbc): 0.9 (ref 0–0.9)
MPV: 7.3 fL (ref 0–99.8)
POC GRANULOCYTE: 8.5 — AB (ref 2–6.9)
POC LYMPH PERCENT: 22.9 %L (ref 10–50)
POC MID %: 7.4 % (ref 0–12)
Platelet Count, POC: 204 10*3/uL (ref 142–424)
RBC: 4.77 M/uL (ref 4.69–6.13)
RDW, POC: 13.8 %
WBC: 12.2 10*3/uL — AB (ref 4.6–10.2)

## 2015-10-12 LAB — POCT RAPID STREP A (OFFICE): Rapid Strep A Screen: NEGATIVE

## 2015-10-12 LAB — POCT INFLUENZA A/B
Influenza A, POC: NEGATIVE
Influenza B, POC: NEGATIVE

## 2015-10-12 LAB — GLUCOSE, POCT (MANUAL RESULT ENTRY): POC Glucose: 127 mg/dl — AB (ref 70–99)

## 2015-10-12 MED ORDER — DOXYCYCLINE HYCLATE 100 MG PO TABS
100.0000 mg | ORAL_TABLET | Freq: Two times a day (BID) | ORAL | Status: DC
Start: 2015-10-12 — End: 2015-12-07

## 2015-10-12 MED ORDER — BENZONATATE 100 MG PO CAPS: 100.0000 mg | ORAL_CAPSULE | Freq: Three times a day (TID) | ORAL | Status: DC | PRN

## 2015-10-12 NOTE — Patient Instructions (Addendum)
   IF you received an x-ray today, you will receive an invoice from [facility]. Please contact [facility] at [phone] with questions or concerns regarding your invoice.   IF you received labwork today, you will receive an invoice from [facility]. Please contact [facility] at [phone] with questions or concerns regarding your invoice.   Our billing staff will not be able to assist you with questions regarding bills from these companies.  You will be contacted with the lab results as soon as they are available. The fastest way to get your results is to activate your My Chart account. Instructions are located on the last page of this paperwork. If you have not heard from us regarding the results in 2 weeks, please contact this office.    Acute Bronchitis Bronchitis is inflammation of the airways that extend from the windpipe into the lungs (bronchi). The inflammation often causes mucus to develop. This leads to a cough, which is the most common symptom of bronchitis.  In acute bronchitis, the condition usually develops suddenly and goes away over time, usually in a couple weeks. Smoking, allergies, and asthma can make bronchitis worse. Repeated episodes of bronchitis may cause further lung problems.  CAUSES Acute bronchitis is most often caused by the same virus that causes a cold. The virus can spread from person to person (contagious) through coughing, sneezing, and touching contaminated objects. SIGNS AND SYMPTOMS   Cough.   Fever.   Coughing up mucus.   Body aches.   Chest congestion.   Chills.   Shortness of breath.   Sore throat.  DIAGNOSIS  Acute bronchitis is usually diagnosed through a physical exam. Your health care provider will also ask you questions about your medical history. Tests, such as chest X-rays, are sometimes done to rule out other conditions.  TREATMENT  Acute bronchitis usually goes away in a  couple weeks. Oftentimes, no medical treatment is necessary. Medicines are sometimes given for relief of fever or cough. Antibiotic medicines are usually not needed but may be prescribed in certain situations. In some cases, an inhaler may be recommended to help reduce shortness of breath and control the cough. A cool mist vaporizer may also be used to help thin bronchial secretions and make it easier to clear the chest.  HOME CARE INSTRUCTIONS  Get plenty of rest.   Drink enough fluids to keep your urine clear or pale yellow (unless you have a medical condition that requires fluid restriction). Increasing fluids may help thin your respiratory secretions (sputum) and reduce chest congestion, and it will prevent dehydration.   Take medicines only as directed by your health care provider.  If you were prescribed an antibiotic medicine, finish it all even if you start to feel better.  Avoid smoking and secondhand smoke. Exposure to cigarette smoke or irritating chemicals will make bronchitis worse. If you are a smoker, consider using nicotine gum or skin patches to help control withdrawal symptoms. Quitting smoking will help your lungs heal faster.   Reduce the chances of another bout of acute bronchitis by washing your hands frequently, avoiding people with cold symptoms, and trying not to touch your hands to your mouth, nose, or eyes.   Keep all follow-up visits as directed by your health care provider.  SEEK MEDICAL CARE IF: Your symptoms do not improve after 1 week of treatment.  SEEK IMMEDIATE MEDICAL CARE IF:  You develop an increased fever or chills.   You have chest pain.     You have severe shortness of breath.  You have bloody sputum.   You develop dehydration.  You faint or repeatedly feel like you are going to pass out.  You develop repeated vomiting.  You develop a severe headache. MAKE SURE YOU:   Understand these instructions.  Will watch your  condition.  Will get help right away if you are not doing well or get worse.   This information is not intended to replace advice given to you by your health care provider. Make sure you discuss any questions you have with your health care provider.   Document Released: 06/26/2004 Document Revised: 06/09/2014 Document Reviewed: 11/09/2012 Elsevier Interactive Patient Education 2016 Elsevier Inc.  

## 2015-10-12 NOTE — Progress Notes (Addendum)
Subjective:  This chart was scribed for Arlyss Queen MD, by Tamsen Roers, at [facility].  This patient was seen in room  3  and the patient's care was started at 11:58 AM.   Chief Complaint  Patient presents with  . Cough    body aches, sore throat, productive cough x 3 days      Patient ID: [name], male    DOB: [birth year], 50 y.o.   MRN: [id number]  HPI HPI Comments: [name] is a 50 y.o. male who presents to the [facility] complaining of a sore throat, productive cough (yellow green mucous /"red &slimey"), subjective fever (three days ago) and body aches.  Patient does not have any co-workers who are ill and states that he just went back to work from taking time off for vacation. He has taken Theraflu for releif and did not have a flu shot this year.   Patient has a history of DVT and arthritis.    Patient Active Problem List   Diagnosis Date Noted  . Left leg swelling 10/05/2015  . Bilateral knee pain 10/05/2015  . Routine general medical examination at a health care facility 08/21/2015  . Acute medial meniscal tear 07/31/2014  . Heterozygous factor V Leiden mutation ([place]) 08/24/2013  . OSA (obstructive sleep apnea) 08/13/2013  . Osteoarthritis of left knee 01/14/2013  . Osteoarthritis of right knee 01/14/2013  . Thrombophlebitis 01/14/2013  . Personal history of pulmonary embolism 11/26/2011  . Incomplete right bundle branch block 06/18/2011  . Hyperglycemia 11/10/2008  . Hyperlipidemia 07/06/2008  . Essential hypertension 07/06/2008  . ERECTILE DYSFUNCTION, ORGANIC 01/28/2007   Past Medical History  Diagnosis Date  . Cluster headache   . Rotator cuff arthropathy 2007    right  . DVT of lower extremity (deep venous thrombosis) ([place]) 6/26-28/2013    Xarelto   . Saddle pulmonary embolus ([place]) 6/26-28/2013    post orthopedic surgery  . Irritability and anger     PMH of  . Sleep apnea 5/15   moderate OSA-has cpap  . HTN (hypertension)     no meds  . Arthritis    Past Surgical History  Procedure Laterality Date  . Wisdom tooth extraction    . Cardiac catheterization  2007    LVdysfunction; normal coronaries  . Upper gastrointestinal endoscopy  2007  . Arthroscopic knee surgery  10/09/2011    Dr Ninfa Linden  . Rotator cuff repair  2007    right  . Knee arthroscopy with medial menisectomy Left 09/20/2014    medial menisectomy chondroplastytella medial plica excision  ;  Surgeon: Dorna Leitz, MD;  Location: [facility];  Service: Orthopedics;  Laterality: Left;   Allergies  Allergen Reactions  . Tramadol Other (See Comments)    insomnia   Prior to Admission medications   Not on File   Social History   Social History  . Marital Status: Single    Spouse Name: N/A  . Number of Children: 6  . Years of Education: 11   Occupational History  . Glass blower/designer    . [employer]    Social History Main Topics  . Smoking status: Never Smoker   . Smokeless tobacco: Never Used  . Alcohol Use: Yes     Comment: occ  . Drug Use: No  . Sexual Activity: Not on file   Other Topics Concern  . Not on file   Social History Narrative  Fun: Exercise       Review of Systems  Constitutional: Positive for fever and chills.  Eyes: Negative for pain, discharge, redness and itching.  Respiratory: Positive for cough. Negative for choking and shortness of breath.   Gastrointestinal: Negative for nausea and vomiting.  Musculoskeletal: Positive for myalgias. Negative for back pain, gait problem, neck pain and neck stiffness.  Skin: Negative for color change.  Neurological: Negative for seizures, syncope and speech difficulty.       Objective:   Physical Exam  Filed Vitals:   10/12/15 1102  BP: 124/80  Pulse: 88  Temp: 98.4 F (36.9 C)  TempSrc: Oral  Resp: 17  Height: 5' 7.5" (1.715 m)  Weight: 252 lb (114.306 kg)  SpO2: 97%    CONSTITUTIONAL:  Patient looks sleepy but cooperative.  HEAD: Normocephalic/atraumatic EYES: EOMI/PERRL ENMT: Throat slightly red, ears are clear.  NECK: supple no meningeal signs SPINE/BACK:entire spine nontender CV: S1/S2 noted, no murmurs/rubs/gallops noted LUNGS: Lungs are clear to auscultation bilaterally, no apparent distress EXTREMITIES: pulses normal/equal, full ROM SKIN: warm, color normal PSYCH: no abnormalities of mood noted, alert and oriented to situation  Results for orders placed or performed in visit on 10/12/15  POCT CBC  Result Value Ref Range   WBC 12.2 (A) 4.6 - 10.2 K/uL   Lymph, poc 2.8 0.6 - 3.4   POC LYMPH PERCENT 22.9 10 - 50 %L   MID (cbc) 0.9 0 - 0.9   POC MID % 7.4 0 - 12 %M   POC Granulocyte 8.5 (A) 2 - 6.9   Granulocyte percent 69.7 37 - 80 %G   RBC 4.77 4.69 - 6.13 M/uL   Hemoglobin 14.5 14.1 - 18.1 g/dL   HCT, POC 40.6 (A) 43.5 - 53.7 %   MCV 85.1 80 - 97 fL   MCH, POC 30.3 27 - 31.2 pg   MCHC 35.6 (A) 31.8 - 35.4 g/dL   RDW, POC 13.8 %   Platelet Count, POC 204 142 - 424 K/uL   MPV 7.3 0 - 99.8 fL  POCT glucose (manual entry)  Result Value Ref Range   POC Glucose 127 (A) 70 - 99 mg/dl  POCT Influenza A/B  Result Value Ref Range   Influenza A, POC Negative Negative   Influenza B, POC Negative Negative  POCT rapid strep A  Result Value Ref Range   Rapid Strep A Screen Negative Negative  Dg Chest 2 View  10/12/2015  CLINICAL DATA:  Leukocytosis EXAM: CHEST  2 VIEW COMPARISON:  November 24, 2014 FINDINGS: There is no edema or consolidation. The heart size and pulmonary vascularity are normal. No adenopathy. No bone lesions. IMPRESSION: No edema or consolidation. Electronically Signed   By: Lowella Grip III M.D.   On: 10/12/2015 13:12       Assessment & Plan:  Patient has a cough productive of yellow green mucous.  Will treat with doxycycline and tessalon pearls.He does have a sleep apnea machine but has not been using it because it is broken. I encouraged  him to talk with his doctor so he can get the proper equipment to use.  Chest x ray did not reveal pneumonia and white count was elevated. I personally performed the services described in this documentation, which was scribed in my presence. The recorded information has been reviewed and is accurate. Darlyne Russian, MD.scribe

## 2015-11-09 ENCOUNTER — Other Ambulatory Visit: Payer: Self-pay | Admitting: Family

## 2015-11-29 ENCOUNTER — Telehealth: Payer: Self-pay | Admitting: *Deleted

## 2015-11-29 NOTE — Telephone Encounter (Signed)
No show previsit 11/29/15. Phone call to patient and messages left on both home and mobile numbers that previsit appt missed. Need to call and reschedule PV by 11/30/15 at 5pm or colonoscopy 12/14/15 would be cancelled and both PV and new colonoscopy appointments would need to be scheduled.

## 2015-11-29 NOTE — Telephone Encounter (Signed)
Attempted patient again and reached him. Rescheduled appointment for 12/07/15. Informed him that if he did miss this scheduled PV appt then colonoscopy on 12/14/15 would be cancelled and both appts would have to be rescheduled for future. He understands.

## 2015-12-07 ENCOUNTER — Ambulatory Visit (AMBULATORY_SURGERY_CENTER): Payer: Self-pay | Admitting: *Deleted

## 2015-12-07 VITALS — Ht 67.0 in | Wt 248.0 lb

## 2015-12-07 DIAGNOSIS — Z1211 Encounter for screening for malignant neoplasm of colon: Secondary | ICD-10-CM

## 2015-12-07 MED ORDER — SUPREP BOWEL PREP KIT 17.5-3.13-1.6 GM/177ML PO SOLN: 1.0000 | Freq: Once | ORAL | Status: DC

## 2015-12-07 NOTE — Progress Notes (Signed)
Patient denies any allergies to egg or soy products. Patient denies complications with anesthesia/sedation.  Patient denies oxygen use at home and denies diet medications. Emmi instructions for colonoscopy  explained but patient refused.  Pamphlet given.

## 2015-12-14 ENCOUNTER — Ambulatory Visit (AMBULATORY_SURGERY_CENTER): Payer: BLUE CROSS/BLUE SHIELD | Admitting: Gastroenterology

## 2015-12-14 ENCOUNTER — Encounter: Payer: Self-pay | Admitting: Gastroenterology

## 2015-12-14 VITALS — BP 111/69 | HR 76 | Temp 96.8°F | Resp 16 | Ht 67.0 in | Wt 248.0 lb

## 2015-12-14 DIAGNOSIS — D120 Benign neoplasm of cecum: Secondary | ICD-10-CM | POA: Diagnosis not present

## 2015-12-14 DIAGNOSIS — Z1211 Encounter for screening for malignant neoplasm of colon: Secondary | ICD-10-CM | POA: Diagnosis present

## 2015-12-14 MED ORDER — SODIUM CHLORIDE 0.9 % IV SOLN: 500.0000 mL | INTRAVENOUS | Status: DC

## 2015-12-14 NOTE — Op Note (Signed)
[name] Procedure Date: 12/14/2015 9:40 AM MRN: [id number] Endoscopist: Milus Banister , MD Age: 50 Referring MD:  Date of Birth: 31-Jul-1965 Gender: Male Account #: [id number] Procedure:                Colonoscopy Indications:              Screening for colorectal malignant neoplasm, This                            is the patient's first colonoscopy Medicines:                Monitored Anesthesia Care Procedure:                Pre-Anesthesia Assessment:                           - Prior to the procedure, a History and Physical                            was performed, and patient medications and                            allergies were reviewed. The patient's tolerance of                            previous anesthesia was also reviewed. The risks                            and benefits of the procedure and the sedation                            options and risks were discussed with the patient.                            All questions were answered, and informed consent                            was obtained. Prior Anticoagulants: The patient has                            taken no previous anticoagulant or antiplatelet                            agents. ASA Grade Assessment: II - A patient with                            mild systemic disease. After reviewing the risks                            and benefits, the patient was deemed in                            satisfactory condition to undergo the procedure.  After obtaining informed consent, the colonoscope                            was passed under direct vision. Throughout the                            procedure, the patient's blood pressure, pulse, and                            oxygen saturations were monitored continuously. The                            Model CF-HQ190L [id number]) scope was introduced                            through the anus and  advanced to the the cecum,                            identified by appendiceal orifice and ileocecal                            valve. The colonoscopy was performed without                            difficulty. The patient tolerated the procedure                            well. The quality of the bowel preparation was                            excellent. The ileocecal valve, appendiceal                            orifice, and rectum were photographed. Scope In: 9:50:51 AM Scope Out: 10:01:43 AM Scope Withdrawal Time: 0 hours 8 minutes 51 seconds  Total Procedure Duration: 0 hours 10 minutes 52 seconds  Findings:                 A 3 mm polyp was found in the cecum. The polyp was                            sessile. The polyp was removed with a cold snare.                            Resection and retrieval were complete.                           Multiple small and large-mouthed diverticula were                            found in the left colon.                           The exam was otherwise without abnormality on  direct and retroflexion views. Complications:            No immediate complications. Estimated blood loss:                            None. Estimated Blood Loss:     Estimated blood loss: none. Impression:               - One 3 mm polyp in the cecum, removed with a cold                            snare. Resected and retrieved.                           - Diverticulosis in the left colon.                           - The examination was otherwise normal on direct                            and retroflexion views. Recommendation:           - Patient has a contact number available for                            emergencies. The signs and symptoms of potential                            delayed complications were discussed with the                            patient. Return to normal activities tomorrow.                            Written discharge  instructions were provided to the                            patient.                           - Resume previous diet.                           - Continue present medications.                           You will receive a letter within 2-3 weeks with the                            pathology results and my final recommendations.                           If the polyp(s) is proven to be 'pre-cancerous' on                            pathology, you will need repeat colonoscopy in 5  years. If the polyp(s) is NOT 'precancerous' on                            pathology then you should repeat colon cancer                            screening in 10 years with colonoscopy without need                            for colon cancer screening by any method prior to                            then (including stool testing). Milus Banister, MD 12/14/2015 10:04:11 AM This report has been signed electronically.

## 2015-12-14 NOTE — Progress Notes (Signed)
A and O x3. Report to RN. Tolerated MAC anesthesia well. 

## 2015-12-14 NOTE — Patient Instructions (Signed)
YOU HAD AN ENDOSCOPIC PROCEDURE TODAY AT THE  ENDOSCOPY CENTER:   Refer to the procedure report that was given to you for any specific questions about what was found during the examination.  If the procedure report does not answer your questions, please call your gastroenterologist to clarify.  If you requested that your care partner not be given the details of your procedure findings, then the procedure report has been included in a sealed envelope for you to review at your convenience later.  YOU SHOULD EXPECT: Some feelings of bloating in the abdomen. Passage of more gas than usual.  Walking can help get rid of the air that was put into your GI tract during the procedure and reduce the bloating. If you had a lower endoscopy (such as a colonoscopy or flexible sigmoidoscopy) you may notice spotting of blood in your stool or on the toilet paper. If you underwent a bowel prep for your procedure, you may not have a normal bowel movement for a few days.  Please Note:  You might notice some irritation and congestion in your nose or some drainage.  This is from the oxygen used during your procedure.  There is no need for concern and it should clear up in a day or so.  SYMPTOMS TO REPORT IMMEDIATELY:   Following lower endoscopy (colonoscopy or flexible sigmoidoscopy):  Excessive amounts of blood in the stool  Significant tenderness or worsening of abdominal pains  Swelling of the abdomen that is new, acute  Fever of 100F or higher  For urgent or emergent issues, a gastroenterologist can be reached at any hour by calling [phone].   DIET: Your first meal following the procedure should be a small meal and then it is ok to progress to your normal diet. Heavy or fried foods are harder to digest and may make you feel nauseous or bloated.  Likewise, meals heavy in dairy and vegetables can increase bloating.  Drink plenty of fluids but you should avoid alcoholic beverages for 24  hours.  ACTIVITY:  You should plan to take it easy for the rest of today and you should NOT DRIVE or use heavy machinery until tomorrow (because of the sedation medicines used during the test).    FOLLOW UP: Our staff will call the number listed on your records the next business day following your procedure to check on you and address any questions or concerns that you may have regarding the information given to you following your procedure. If we do not reach you, we will leave a message.  However, if you are feeling well and you are not experiencing any problems, there is no need to return our call.  We will assume that you have returned to your regular daily activities without incident.  If any biopsies were taken you will be contacted by phone or by letter within the next 1-3 weeks.  Please call us at [phone] if you have not heard about the biopsies in 3 weeks.    SIGNATURES/CONFIDENTIALITY: You and/or your care partner have signed paperwork which will be entered into your electronic medical record.  These signatures attest to the fact that that the information above on your After Visit Summary has been reviewed and is understood.  Full responsibility of the confidentiality of this discharge information lies with you and/or your care-partner.  Please review polyp, diverticulosis, and high fiber diet handouts provided. 

## 2015-12-14 NOTE — Progress Notes (Signed)
Called to room to assist during endoscopic procedure.  Patient ID and intended procedure confirmed with present staff. Received instructions for my participation in the procedure from the performing physician.  

## 2015-12-17 ENCOUNTER — Telehealth: Payer: Self-pay

## 2015-12-17 NOTE — Telephone Encounter (Signed)
  Follow up Call-  Call back number 12/14/2015  Post procedure Call Back phone  # [phone]  Permission to leave phone message Yes    Patient was called for follow up after his procedure on 12/14/2015. NO answer at the number given for follow up phone call. A message was left on the answering machine.

## 2015-12-20 ENCOUNTER — Ambulatory Visit (INDEPENDENT_AMBULATORY_CARE_PROVIDER_SITE_OTHER): Payer: BLUE CROSS/BLUE SHIELD | Admitting: Internal Medicine

## 2015-12-20 ENCOUNTER — Other Ambulatory Visit (INDEPENDENT_AMBULATORY_CARE_PROVIDER_SITE_OTHER): Payer: BLUE CROSS/BLUE SHIELD

## 2015-12-20 ENCOUNTER — Encounter: Payer: Self-pay | Admitting: Internal Medicine

## 2015-12-20 VITALS — BP 120/76 | HR 76 | Temp 98.8°F | Resp 20 | Wt 241.0 lb

## 2015-12-20 DIAGNOSIS — R202 Paresthesia of skin: Secondary | ICD-10-CM | POA: Diagnosis not present

## 2015-12-20 DIAGNOSIS — E162 Hypoglycemia, unspecified: Secondary | ICD-10-CM | POA: Diagnosis not present

## 2015-12-20 DIAGNOSIS — R2243 Localized swelling, mass and lump, lower limb, bilateral: Secondary | ICD-10-CM | POA: Diagnosis not present

## 2015-12-20 LAB — VITAMIN B12: VITAMIN B 12: 1411 pg/mL — AB (ref 211–911)

## 2015-12-20 LAB — POCT GLYCOSYLATED HEMOGLOBIN (HGB A1C): HEMOGLOBIN A1C: 6.4

## 2015-12-20 NOTE — Patient Instructions (Signed)
Please continue all other medications as before, and refills have been done if requested.  Please have the pharmacy call with any other refills you may need.  Please keep your appointments with your specialists as you may have planned  You will be contacted regarding the referral for: Nerve test for the arms and legs  Please wear the wrist splints at night until the hands are better  Please go to the LAB in the Basement (turn left off the elevator) for the tests to be done today - just one test - the B12 today  You will be contacted by phone if any changes need to be made immediately.  Otherwise, you will receive a letter about your results with an explanation, but please check with MyChart first.  Please remember to sign up for MyChart if you have not done so, as this will be important to you in the future with finding out test results, communicating by private email, and scheduling acute appointments online when needed.

## 2015-12-20 NOTE — Progress Notes (Signed)
Pre visit review using our clinic review tool, if applicable. No additional management support is needed unless otherwise documented below in the visit note. 

## 2015-12-20 NOTE — Progress Notes (Signed)
Subjective:    Patient ID: [name], male    DOB: 1966-02-20, 50 y.o.   MRN: [id number]  HPI  Here with c/o bilat feet tingling and pins and needles intermittent for 2 wks, concerned about possible DM since so many persons in his family and he is thinking its just a matter of time.  Pt denies polydipsia, polyuria,.  Pt states overall good compliance with meds, trying to be more active.  Has lost wt with better diet.   Wt Readings from Last 3 Encounters:  12/20/15 241 lb (109.317 kg)  12/14/15 248 lb (112.492 kg)  12/07/15 248 lb (112.492 kg)  Pt denies chest pain, increased sob or doe, wheezing, orthopnea, PND, increased LE swelling, palpitations, dizziness or syncope.  Also states has bilat hand numbness for 1 mo, milder than the feet, only happens with waking up in the am, takes a few minutues to shake the hands and go away.  Also c/o small edema to legs for many months he can remember, with mult varicosities; swelling goes down at night, back by the next evening after up all day.   Past Medical History:  Diagnosis Date  . Arthritis    knees - no med  . Cluster headache    Hx - resolved per patient  . DVT of lower extremity (deep venous thrombosis) ([place]) 6/26-28/2013   Xarelto - resolved - history  . HTN (hypertension)    currently no meds  . Irritability and anger    PMH of  . Kidney stone    passed stone, no surgery required.  . Rotator cuff arthropathy 2007   right  . Saddle pulmonary embolus ([place]) 6/26-28/2013   post orthopedic surgery - resolved  . Sleep apnea 5/15   moderate OSA-has cpap but does not use it   Past Surgical History:  Procedure Laterality Date  . arthroscopic knee surgery  10/09/2011   Dr Ninfa Linden  . CARDIAC CATHETERIZATION  2007   LVdysfunction; normal coronaries  . KNEE ARTHROSCOPY WITH MEDIAL MENISECTOMY Left 09/20/2014   medial menisectomy chondroplastytella medial plica excision  ;  Surgeon: Dorna Leitz, MD;  Location: [facility];  Service: Orthopedics;  Laterality: Left;  . ROTATOR CUFF REPAIR  2007   right  . UPPER GASTROINTESTINAL ENDOSCOPY  2007   normal  . WISDOM TOOTH EXTRACTION      reports that he has never smoked. He has never used smokeless tobacco. He reports that he drinks alcohol. He reports that he does not use drugs. family history includes Cancer in his mother; Deep vein thrombosis in his maternal grandmother; Diabetes in his brother, maternal aunt, and maternal grandmother; Heart attack (age of onset: 69) in his brother; Heart attack (age of onset: 44) in his brother; Heart disease in his maternal grandmother; Hypertension in his brother and maternal grandmother; Migraines in his brother; Stroke in his maternal grandfather and maternal grandmother; Stroke (age of onset: 48) in his brother. Allergies  Allergen Reactions  . Tramadol Other (See Comments)    insomnia   Current Outpatient Prescriptions on File Prior to Visit  Medication Sig Dispense Refill  . sildenafil (REVATIO) 20 MG tablet TAKE 3 TABLETS BY MOUTH DAILY AS NEEDED 50 tablet 1   No current facility-administered medications on file prior to visit.    Review of Systems  Constitutional: Negative for unusual diaphoresis or night sweats HENT: Negative for ear swelling or discharge Eyes: Negative for worsening visual haziness  Respiratory: Negative for choking  and stridor.   Gastrointestinal: Negative for distension or worsening eructation Genitourinary: Negative for retention or change in urine volume.  Musculoskeletal: Negative for other MSK pain or swelling Skin: Negative for color change and worsening wound Neurological: Negative for tremors and numbness other than noted  Psychiatric/Behavioral: Negative for decreased concentration or agitation other than above       Objective:   Physical Exam BP 120/76 mmHg  Pulse 76  Temp(Src) 98.8 F (37.1 C) (Oral)  Resp 20  Wt 241 lb (109.317 kg)  SpO2 98% VS noted,    Constitutional: Pt appears in no apparent distress HENT: Head: NCAT.  Right Ear: External ear normal.  Left Ear: External ear normal.  Eyes: . Pupils are equal, round, and reactive to light. Conjunctivae and EOM are normal Neck: Normal range of motion. Neck supple.  Cardiovascular: Normal rate and regular rhythm.   Pulmonary/Chest: Effort normal and breath sounds without rales or wheezing.  Abd:  Soft, NT, ND, + BS Neurological: Pt is alert. Not confused , motor 5/5 intact, sens intact, cn 2-12 intact, dtr intact Skin: Skin is warm. No rash, no LE edema Psychiatric: Pt behavior is normal. No agitation.  mild nervous  POCT HgB A1C  Status: Finalresult Visible to patient:  Not Released Nextappt: 01/03/2016 at 11:30 AM in Pulmonology Rigoberto Noel., MD) Dx:  Low blood sugar reading    Normal         4:20 PM    Hemoglobin A1C 6.4      Specimen Collected: 12/20/15 4:20 PM   Last Resulted: 12/20/15 4:20 PM             Assessment & Plan:

## 2015-12-23 DIAGNOSIS — R202 Paresthesia of skin: Secondary | ICD-10-CM | POA: Insufficient documentation

## 2015-12-23 NOTE — Assessment & Plan Note (Signed)
Most likely c/w cts, mild, Exam benign, etiology unclear, for b12 check, also NCS/EMG upper and lower extremities, also for bilat wrist splints at night

## 2015-12-23 NOTE — Assessment & Plan Note (Signed)
Most likely c/w venous insufficiency, very mild, d/w pt, ok to folllow without specific tx for now

## 2015-12-23 NOTE — Assessment & Plan Note (Addendum)
Exam benign, etiology unclear, for b12 check, also NCS/EMG upper and lower extremities, note hgba1c at [facility] today is normal

## 2015-12-25 ENCOUNTER — Encounter: Payer: Self-pay | Admitting: Gastroenterology

## 2015-12-26 ENCOUNTER — Telehealth: Payer: Self-pay | Admitting: Gastroenterology

## 2015-12-27 NOTE — Telephone Encounter (Signed)
Left message on machine to call back  

## 2015-12-31 NOTE — Telephone Encounter (Signed)
Left message on machine to call back  

## 2016-01-03 ENCOUNTER — Institutional Professional Consult (permissible substitution): Payer: BLUE CROSS/BLUE SHIELD | Admitting: Pulmonary Disease

## 2016-01-03 NOTE — Telephone Encounter (Signed)
Left message on machine to call back will wait for further communication from the pt  

## 2016-01-04 ENCOUNTER — Other Ambulatory Visit: Payer: Self-pay | Admitting: Family

## 2016-02-27 ENCOUNTER — Encounter: Payer: Self-pay | Admitting: Pulmonary Disease

## 2016-02-27 ENCOUNTER — Ambulatory Visit (INDEPENDENT_AMBULATORY_CARE_PROVIDER_SITE_OTHER): Payer: BLUE CROSS/BLUE SHIELD | Admitting: Pulmonary Disease

## 2016-02-27 DIAGNOSIS — G4733 Obstructive sleep apnea (adult) (pediatric): Secondary | ICD-10-CM

## 2016-02-27 DIAGNOSIS — I10 Essential (primary) hypertension: Secondary | ICD-10-CM

## 2016-02-27 NOTE — Addendum Note (Signed)
Addended by: Mathis Dad on: 02/27/2016 05:32 PM   Modules accepted: Orders

## 2016-02-27 NOTE — Assessment & Plan Note (Signed)
CPAP supplies will be renewed and download checked in 4 weeks  Weight loss encouraged, compliance with goal of at least 4-6 hrs every night is the expectation. Advised against medications with sedative side effects Cautioned against driving when sleepy - understanding that sleepiness will vary on a day to day basis

## 2016-02-27 NOTE — Progress Notes (Signed)
   Subjective:    Patient ID: [name], male    DOB: 1966-04-11, 50 y.o.   MRN: [id number]  HPI  Chief Complaint  Patient presents with  . Follow-up    Patient has CPAP machine but has not worn in over 1 year, no download available.  wants to discuss getting back on CPAP machine today.    He was set up with auto CPAP with nasal pillows and 2015 after sleep study showed moderate OSA. He is significant improvement in his daytime tiredness and snoring. He however stopped using this after a year and 2016. He states that this was because he had a breakup and moved and did not get CPAP supplies. His current girlfriend tells him that he snores a lot and he is experiencing increased tiredness and sleep pressure. He has difficulty keeping up with his 2 jobs He would like to get back on his CPAP . He still has the old machine but would like new supplies. He used nasal pillows back then and denies problems with that interface or with CPAP pressure He has faded as high as 270 and is close to 30 his current weight is 244 pounds  He has a history of sinopulmonary embolism in 2013 after knee surgery. HST 09/2013: 24/hr   Past Medical History:  Diagnosis Date  . Arthritis    knees - no med  . Cluster headache    Hx - resolved per patient  . DVT of lower extremity (deep venous thrombosis) ([place]) 6/26-28/2013   Xarelto - resolved - history  . HTN (hypertension)    currently no meds  . Irritability and anger    PMH of  . Kidney stone    passed stone, no surgery required.  . Rotator cuff arthropathy 2007   right  . Saddle pulmonary embolus ([place]) 6/26-28/2013   post orthopedic surgery - resolved  . Sleep apnea 5/15   moderate OSA-has cpap but does not use it     Review of Systems neg for any significant sore throat, dysphagia, itching, sneezing, nasal congestion or excess/ purulent secretions, fever, chills, sweats, unintended wt loss, pleuritic or exertional cp, hempoptysis, orthopnea  pnd or change in chronic leg swelling. Also denies presyncope, palpitations, heartburn, abdominal pain, nausea, vomiting, diarrhea or change in bowel or urinary habits, dysuria,hematuria, rash, arthralgias, visual complaints, headache, numbness weakness or ataxia.     Objective:   Physical Exam  Gen. Pleasant, obese, in no distress ENT - no lesions, no post nasal drip Neck: No JVD, no thyromegaly, no carotid bruits Lungs: no use of accessory muscles, no dullness to percussion, decreased without rales or rhonchi  Cardiovascular: Rhythm regular, heart sounds  normal, no murmurs or gallops, no peripheral edema Musculoskeletal: No deformities, no cyanosis or clubbing , no tremors       Assessment & Plan:

## 2016-02-27 NOTE — Assessment & Plan Note (Signed)
Controlled off medications 

## 2016-02-27 NOTE — Patient Instructions (Signed)
CPAP supplies will be renewed and download checked in 4 weeks

## 2016-03-14 ENCOUNTER — Telehealth: Payer: Self-pay | Admitting: Pulmonary Disease

## 2016-03-14 NOTE — Telephone Encounter (Signed)
Spoke with the pt  He states that [facility] told him that his ins will not cover CPAP machine  He states that he was unable to use the machine for a while due to stress at home, and now can not use b/c he has no supplies  I called [employer] and was onb hold for 5 min  Will call back Monday 03/17/16

## 2016-03-17 NOTE — Telephone Encounter (Signed)
Called and spoke with [employer] and she stated that they have left a message for the pt to call them back. They have tried to go through the pts insurance to get the cpap covered and the insurance keeps denying the charges due to the pt being non compliant with the cpap.  Pt will have to pay cash price for supplies, but the pt never returned the call to [employer] to let them know what he wanted to do.  Will forward to RA as an FYI.

## 2016-03-20 NOTE — Telephone Encounter (Signed)
Pl let pt know & ask him t o get in touch with [employer] He may have to pay for initial supplies or get online Once he shows compliance x 76month , I can document & write a note to insurance to see if they will pay for further supplies

## 2016-03-21 NOTE — Telephone Encounter (Signed)
lmomtcb x 1 for the pt 

## 2016-03-21 NOTE — Telephone Encounter (Signed)
I spoke with the pt and notified of recs per RA  He verbalized understanding and nothing further needed

## 2016-05-23 ENCOUNTER — Ambulatory Visit: Payer: BLUE CROSS/BLUE SHIELD | Admitting: Adult Health

## 2016-05-29 ENCOUNTER — Ambulatory Visit: Payer: BLUE CROSS/BLUE SHIELD | Admitting: Adult Health

## 2016-06-05 ENCOUNTER — Other Ambulatory Visit: Payer: Self-pay | Admitting: Internal Medicine

## 2016-07-10 ENCOUNTER — Other Ambulatory Visit: Payer: Self-pay | Admitting: Internal Medicine

## 2016-08-29 ENCOUNTER — Other Ambulatory Visit: Payer: Self-pay | Admitting: Internal Medicine

## 2016-11-06 ENCOUNTER — Encounter (HOSPITAL_COMMUNITY): Payer: Self-pay

## 2016-11-06 ENCOUNTER — Emergency Department (HOSPITAL_COMMUNITY): Payer: BLUE CROSS/BLUE SHIELD

## 2016-11-06 ENCOUNTER — Other Ambulatory Visit: Payer: Self-pay

## 2016-11-06 ENCOUNTER — Emergency Department (HOSPITAL_COMMUNITY)
Admission: EM | Admit: 2016-11-06 | Discharge: 2016-11-07 | Disposition: A | Discharge status: Home / Self Care | Payer: BLUE CROSS/BLUE SHIELD | Attending: Emergency Medicine | Admitting: Emergency Medicine

## 2016-11-06 DIAGNOSIS — R0602 Shortness of breath: Secondary | ICD-10-CM

## 2016-11-06 DIAGNOSIS — M7989 Other specified soft tissue disorders: Secondary | ICD-10-CM | POA: Insufficient documentation

## 2016-11-06 DIAGNOSIS — I10 Essential (primary) hypertension: Secondary | ICD-10-CM | POA: Diagnosis not present

## 2016-11-06 DIAGNOSIS — R079 Chest pain, unspecified: Secondary | ICD-10-CM | POA: Insufficient documentation

## 2016-11-06 LAB — I-STAT TROPONIN, ED
TROPONIN I, POC: 0 ng/mL (ref 0.00–0.08)
Troponin i, poc: 0 ng/mL (ref 0.00–0.08)

## 2016-11-06 LAB — BASIC METABOLIC PANEL
ANION GAP: 7 (ref 5–15)
BUN: 10 mg/dL (ref 6–20)
CALCIUM: 9 mg/dL (ref 8.9–10.3)
CO2: 23 mmol/L (ref 22–32)
Chloride: 109 mmol/L (ref 101–111)
Creatinine, Ser: 0.91 mg/dL (ref 0.61–1.24)
GLUCOSE: 96 mg/dL (ref 65–99)
Potassium: 4 mmol/L (ref 3.5–5.1)
SODIUM: 139 mmol/L (ref 135–145)

## 2016-11-06 LAB — CBC
HCT: 41.2 % (ref 39.0–52.0)
HEMOGLOBIN: 13.7 g/dL (ref 13.0–17.0)
MCH: 28.8 pg (ref 26.0–34.0)
MCHC: 33.3 g/dL (ref 30.0–36.0)
MCV: 86.7 fL (ref 78.0–100.0)
Platelets: 224 10*3/uL (ref 150–400)
RBC: 4.75 MIL/uL (ref 4.22–5.81)
RDW: 13.4 % (ref 11.5–15.5)
WBC: 9.8 10*3/uL (ref 4.0–10.5)

## 2016-11-06 LAB — BRAIN NATRIURETIC PEPTIDE: B NATRIURETIC PEPTIDE 5: 12.4 pg/mL (ref 0.0–100.0)

## 2016-11-06 MED ORDER — IOPAMIDOL (ISOVUE-370) INJECTION 76%
INTRAVENOUS | Status: AC
  Administered 2016-11-06: 100 mL
  Filled 2016-11-06: qty 100

## 2016-11-06 MED ORDER — ENOXAPARIN SODIUM 150 MG/ML ~~LOC~~ SOLN
1.5000 mg/kg | Freq: Once | SUBCUTANEOUS | Status: AC
  Administered 2016-11-07: 170 mg via SUBCUTANEOUS
  Filled 2016-11-06: qty 1.13

## 2016-11-06 MED ORDER — HYDROCODONE-ACETAMINOPHEN 5-325 MG PO TABS: 2.0000 | ORAL_TABLET | Freq: Once | ORAL | Status: DC

## 2016-11-06 MED ORDER — HYDROCODONE-ACETAMINOPHEN 5-325 MG PO TABS: 1.0000 | ORAL_TABLET | ORAL | 0 refills | Status: DC | PRN

## 2016-11-06 NOTE — ED Triage Notes (Signed)
Per Pt, Pt is coming from home with complaints of SOB and dizziness x 1.5 months. Pt reports some intermittent CP. Hx of DVT and PE. Reports some swelling in his left leg. Denies being on a blood thinner at this time. Reports dry cough.

## 2016-11-06 NOTE — Discharge Instructions (Signed)
IMPORTANT PATIENT INSTRUCTIONS:  You have been scheduled for an Outpatient Vascular Study at [facility].   ° °If tomorrow is a Saturday, Sunday or holiday, please go to the [facility] Registration Desk at 8 am tomorrow morning and tell them you are there for a vascular study.  If tomorrow is a weekday (Monday-Friday), please go to  Admitting Department at 8 am and tell them  °you are  °there for a vascular study. ° °

## 2016-11-06 NOTE — ED Provider Notes (Signed)
[facility] DEPT Provider Note   CSN: [id number] Arrival date & time: 11/06/16  1702     History   Chief Complaint Chief Complaint  Patient presents with  . Shortness of Breath    HPI [name] is a 51 y.o. male.  HPI   51 year old male with history of pulmonary embolism status post orthopedic surgery on his left knee who presents with chest pain and leg swelling. The patient states that he has had intermittent left leg swelling since his surgery. However, over the last several weeks, he has had progressively worsening swelling with associated aching, throbbing pain. Over the same time period, he is also noticed intermittent sharp, positional chest pain that is worse with movement and palpation. He became concerned about having a possible blood clot so he presents for evaluation. He states he is no longer on blood thinners. Denies any alleviating factors. No fevers or chills. He has occasional cough but no sputum production. No hemoptysis.  Past Medical History:  Diagnosis Date  . Arthritis    knees - no med  . Cluster headache    Hx - resolved per patient  . DVT of lower extremity (deep venous thrombosis) ([place]) 6/26-28/2013   Xarelto - resolved - history  . HTN (hypertension)    currently no meds  . Irritability and anger    PMH of  . Kidney stone    passed stone, no surgery required.  . Rotator cuff arthropathy 2007   right  . Saddle pulmonary embolus ([place]) 6/26-28/2013   post orthopedic surgery - resolved  . Sleep apnea 5/15   moderate OSA-has cpap but does not use it    Patient Active Problem List   Diagnosis Date Noted  . Paresthesia of hand, bilateral 12/23/2015  . Paresthesia of foot, bilateral 12/20/2015  . Localized swelling of both lower legs 10/05/2015  . Bilateral knee pain 10/05/2015  . Routine general medical examination at a health care facility 08/21/2015  . Acute medial meniscal tear 07/31/2014  . Heterozygous factor V Leiden mutation  ([place]) 08/24/2013  . OSA (obstructive sleep apnea) 08/13/2013  . Osteoarthritis of left knee 01/14/2013  . Osteoarthritis of right knee 01/14/2013  . Thrombophlebitis 01/14/2013  . Personal history of pulmonary embolism 11/26/2011  . Incomplete right bundle branch block 06/18/2011  . Hyperglycemia 11/10/2008  . Hyperlipidemia 07/06/2008  . Essential hypertension 07/06/2008  . ERECTILE DYSFUNCTION, ORGANIC 01/28/2007    Past Surgical History:  Procedure Laterality Date  . arthroscopic knee surgery  10/09/2011   Dr Ninfa Linden  . CARDIAC CATHETERIZATION  2007   LVdysfunction; normal coronaries  . KNEE ARTHROSCOPY WITH MEDIAL MENISECTOMY Left 09/20/2014   medial menisectomy chondroplastytella medial plica excision  ;  Surgeon: Dorna Leitz, MD;  Location: [facility];  Service: Orthopedics;  Laterality: Left;  . ROTATOR CUFF REPAIR  2007   right  . UPPER GASTROINTESTINAL ENDOSCOPY  2007   normal  . WISDOM TOOTH EXTRACTION         Home Medications    Prior to Admission medications   Medication Sig Start Date End Date Taking? Authorizing Provider  ibuprofen (ADVIL,MOTRIN) 200 MG tablet Take 800 mg by mouth every 6 (six) hours as needed for headache.   Yes [provider]  Multiple Vitamins-Minerals (ONE-A-DAY ENERGY PO) Take 1 tablet by mouth daily.   Yes [provider]  HYDROcodone-acetaminophen (NORCO/VICODIN) 5-325 MG tablet Take 1-2 tablets by mouth every 4 (four) hours as needed for severe pain. 11/06/16  Duffy Bruce, MD  sildenafil (REVATIO) 20 MG tablet TAKE 3 TABLETS BY MOUTH DAILY AS NEEDED. Patient not taking: Reported on 11/06/2016 09/01/16   Biagio Borg, MD    Family History Family History  Problem Relation Age of Onset  . Cancer Mother        Mesothelioma   . Diabetes Brother   . Hypertension Brother   . Stroke Brother 85  . Heart attack Brother 63  . Diabetes Maternal Aunt   . Diabetes Maternal Grandmother   . Heart disease  Maternal Grandmother   . Hypertension Maternal Grandmother   . Stroke Maternal Grandmother        in 78s  . Deep vein thrombosis Maternal Grandmother   . Stroke Maternal Grandfather        > 55  . Migraines Brother   . Heart attack Brother 32  . Colon cancer Neg Hx   . Esophageal cancer Neg Hx   . Rectal cancer Neg Hx   . Stomach cancer Neg Hx     Social History Social History  Substance Use Topics  . Smoking status: Never Smoker  . Smokeless tobacco: Never Used  . Alcohol use 0.0 oz/week     Comment: Stopped drinking 10/2015     Allergies   Tramadol   Review of Systems Review of Systems  Constitutional: Positive for fatigue. Negative for fever.  Respiratory: Positive for shortness of breath.   Cardiovascular: Positive for chest pain and leg swelling.  Musculoskeletal: Positive for arthralgias.  All other systems reviewed and are negative.    Physical Exam Updated Vital Signs BP (!) 154/94   Pulse 63   Temp 97.9 F (36.6 C) (Oral)   Resp 14   Ht 5\' 7"  (1.702 m)   Wt 112.5 kg (248 lb)   SpO2 99%   BMI 38.84 kg/m   Physical Exam  Constitutional: He is oriented to person, place, and time. He appears well-developed and well-nourished. No distress.  HENT:  Head: Normocephalic and atraumatic.  Eyes: Conjunctivae are normal.  Neck: Neck supple.  Cardiovascular: Normal rate, regular rhythm and normal heart sounds.  Exam reveals no friction rub.   No murmur heard. Pulmonary/Chest: Effort normal and breath sounds normal. No respiratory distress. He has no wheezes. He has no rales. He exhibits tenderness (Moderate tenderness to palpation over left parasternal area. No bruising.).  Abdominal: He exhibits no distension.  Musculoskeletal: He exhibits edema (Mild edema throughout left lower extremity greater than right lower extremity. Patient has no significant tenderness to palpation. No palpable cords. No popliteal tenderness. 2+ radial and DP pulses bilaterally.).    Neurological: He is alert and oriented to person, place, and time. He exhibits normal muscle tone.  Skin: Skin is warm. Capillary refill takes less than 2 seconds.  Psychiatric: He has a normal mood and affect.  Nursing note and vitals reviewed.    ED Treatments / Results  Labs (all labs ordered are listed, but only abnormal results are displayed) Labs Reviewed  [facility], ED  I-STAT [place], ED    EKG  EKG Interpretation  Date/Time:  Thursday November 06 2016 17:06:41 EDT Ventricular Rate:  78 PR Interval:  140 QRS Duration: 92 QT Interval:  384 QTC Calculation: 437 R Axis:   38 Text Interpretation:  Normal sinus rhythm Incomplete right bundle branch block Nonspecific T wave abnormality Abnormal ECG No significant change since last tracing Confirmed by Ellender Hose,  Lysbeth Galas [phone]) on 11/06/2016 11:56:49 PM       Radiology Dg Chest 2 View  Result Date: 11/06/2016 CLINICAL DATA:  Left leg swelling with shortness of breath EXAM: CHEST  2 VIEW COMPARISON:  10/12/2015 FINDINGS: The heart size and mediastinal contours are within normal limits. Both lungs are clear. The visualized skeletal structures are unremarkable. IMPRESSION: No active cardiopulmonary disease. Electronically Signed   By: Donavan Foil M.D.   On: 11/06/2016 18:09   Ct Angio Chest Pe W Or Wo Contrast  Result Date: 11/06/2016 CLINICAL DATA:  Ongoing chest pain for 1-1/2 months EXAM: CT ANGIOGRAPHY CHEST WITH CONTRAST TECHNIQUE: Multidetector CT imaging of the chest was performed using the standard protocol during bolus administration of intravenous contrast. Multiplanar CT image reconstructions and MIPs were obtained to evaluate the vascular anatomy. CONTRAST:  100 mL Isovue 370 intravenous COMPARISON:  11/06/2016 radiograph, CT chest 10/10/2014 FINDINGS: Cardiovascular: Slightly suboptimal contrast bolus. No definite filling defects within the central or  segmental pulmonary arterial branches to suggest acute embolus. Non aneurysmal aorta. Minimal coronary artery calcification. Normal heart size. No pericardial effusion Mediastinum/Nodes: No enlarged mediastinal, hilar, or axillary lymph nodes. Thyroid gland, trachea, and esophagus demonstrate no significant findings. Lungs/Pleura: Lungs are clear. No pleural effusion or pneumothorax. Upper Abdomen: Partially visualized 2.1 cm hypodensity in the upper pole of the right kidney. Musculoskeletal: Degenerative changes. No acute or suspicious bone lesion. Review of the MIP images confirms the above findings. IMPRESSION: 1. Slightly suboptimal contrast bolus. No definite acute pulmonary embolus is visualized 2. Clear lung fields 3. Partially visualized 2.1 cm hypodensity in the upper pole of the right kidney possibly a cyst Electronically Signed   By: Donavan Foil M.D.   On: 11/06/2016 23:29    Procedures Procedures (including critical care time)  Medications Ordered in ED Medications  enoxaparin (LOVENOX) injection 170 mg (not administered)  HYDROcodone-acetaminophen (NORCO/VICODIN) 5-325 MG per tablet 2 tablet (not administered)  iopamidol (ISOVUE-370) 76 % injection (100 mLs  Contrast Given 11/06/16 2247)     Initial Impression / Assessment and Plan / ED Course  I have reviewed the triage vital signs and the nursing notes.  Pertinent labs & imaging results that were available during my care of the patient were reviewed by me and considered in my medical decision making (see chart for details).     51 year old male with history of DVT and PE here with left leg swelling and chest pain. I suspect his leg swelling is secondary to post-thrombotic syndrome with mild venous insufficiency. However, given his history, CT angiogram obtained and shows no evidence of pulmonary embolism. His chest pain is reproducible and I suspect is secondary to musculoskeletal pain. His heart score is less than 3 and EKG is  nonischemic with negative troponin 2. I do not suspect ACS. Pain is not consistent with dissection. Given his significant leg swelling, however, will give him a shot of Lovenox and arrange for outpatient ultrasound tomorrow. Return precautions were given.  Final Clinical Impressions(s) / ED Diagnoses   Final diagnoses:  Chest pain  Leg swelling  Shortness of breath    New Prescriptions New Prescriptions   HYDROCODONE-ACETAMINOPHEN (NORCO/VICODIN) 5-325 MG TABLET    Take 1-2 tablets by mouth every 4 (four) hours as needed for severe pain.     Duffy Bruce, MD 11/06/16 [phone]

## 2016-11-07 ENCOUNTER — Ambulatory Visit (HOSPITAL_BASED_OUTPATIENT_CLINIC_OR_DEPARTMENT_OTHER)
Admission: RE | Admit: 2016-11-07 | Discharge: 2016-11-07 | Disposition: A | Discharge status: Home / Self Care | Payer: BLUE CROSS/BLUE SHIELD | Source: Ambulatory Visit | Attending: Emergency Medicine | Admitting: Emergency Medicine

## 2016-11-07 DIAGNOSIS — M79609 Pain in unspecified limb: Secondary | ICD-10-CM | POA: Diagnosis not present

## 2016-11-07 DIAGNOSIS — M7989 Other specified soft tissue disorders: Secondary | ICD-10-CM | POA: Insufficient documentation

## 2016-11-07 NOTE — Progress Notes (Signed)
**  Preliminary report by tech**  Left lower extremity venous duplex complete. There is no evidence of deep or superficial vein thrombosis involving the left lower extremity. All visualized vessels appear patent and compressible. There is no evidence of a Baker's cyst on the left.  11/07/16 2:02 PM [name] RVT

## 2016-11-07 NOTE — ED Notes (Signed)
Pt does not want pain medication, will fill prescription for tomorrow

## 2016-11-08 NOTE — Progress Notes (Signed)
[name] [address] [place], [place] [address] Phone: [phone] Subjective:     CC: Bilateral knee pain f/u  NOB:[id number]  [name] is a 52 y.o. male coming in with complaint of bilateral knee pain. Past medical history significant for deep venous thrombosis. Patient has been seen by me and has had a degenerative tear of the meniscus as well as mild to moderate osteophytic changes. Patient was last seen greater than 2 years ago. Patient states That this more annoying the left knee seems to be given him trouble. States that as a throbbing aching pain with some instability. Patient will states that the brace is unfortunately not working anymore. Patient's symptoms of the pain is severe and is having increasing swelling. Affecting daily activities and waking him up at night.    Past Medical History:  Diagnosis Date  . Arthritis    knees - no med  . Cluster headache    Hx - resolved per patient  . DVT of lower extremity (deep venous thrombosis) ([facility]) 6/26-28/2013   Xarelto - resolved - history  . HTN (hypertension)    currently no meds  . Irritability and anger    PMH of  . Kidney stone    passed stone, no surgery required.  . Rotator cuff arthropathy 2007   right  . Saddle pulmonary embolus ([facility]) 6/26-28/2013   post orthopedic surgery - resolved  . Sleep apnea 5/15   moderate OSA-has cpap but does not use it   Past Surgical History:  Procedure Laterality Date  . arthroscopic knee surgery  10/09/2011   Dr Ninfa Linden  . CARDIAC CATHETERIZATION  2007   LVdysfunction; normal coronaries  . KNEE ARTHROSCOPY WITH MEDIAL MENISECTOMY Left 09/20/2014   medial menisectomy chondroplastytella medial plica excision  ;  Surgeon: Dorna Leitz, MD;  Location: [facility];  Service: Orthopedics;  Laterality: Left;  . ROTATOR CUFF REPAIR  2007   right  . UPPER GASTROINTESTINAL ENDOSCOPY  2007   normal  . WISDOM TOOTH EXTRACTION      Social History   Social History  . Marital status: Single    Spouse name: N/A  . Number of children: 6  . Years of education: 66   Occupational History  . Glass blower/designer    . [employer]    Social History Main Topics  . Smoking status: Never Smoker  . Smokeless tobacco: Never Used  . Alcohol use 0.0 oz/week     Comment: Stopped drinking 10/2015  . Drug use: No  . Sexual activity: Not Asked   Other Topics Concern  . None   Social History Narrative   Fun: Exercise   Allergies  Allergen Reactions  . Tramadol Other (See Comments)    insomnia   Family History  Problem Relation Age of Onset  . Cancer Mother        Mesothelioma   . Diabetes Brother   . Hypertension Brother   . Stroke Brother 18  . Heart attack Brother 58  . Diabetes Maternal Aunt   . Diabetes Maternal Grandmother   . Heart disease Maternal Grandmother   . Hypertension Maternal Grandmother   . Stroke Maternal Grandmother        in 56s  . Deep vein thrombosis Maternal Grandmother   . Stroke Maternal Grandfather        > 55  . Migraines Brother   . Heart attack Brother 45  . Colon cancer Neg  Hx   . Esophageal cancer Neg Hx   . Rectal cancer Neg Hx   . Stomach cancer Neg Hx     Past medical history, social, surgical and family history all reviewed in electronic medical record.  No pertanent information unless stated regarding to the chief complaint.   Review of Systems:Review of systems updated and as accurate as of 11/10/16  No headache, visual changes, nausea, vomiting, diarrhea, constipation, dizziness, abdominal pain, skin rash, fevers, chills, night sweats, weight loss, swollen lymph nodes, body aches, chest pain, shortness of breath, mood changes.  Positive muscle aches and joint swelling  Objective  Blood pressure 140/90, pulse 78, height 5\' 6"  (1.676 m), weight 255 lb (115.7 kg), SpO2 96 %. Systems examined below as of 11/10/16   General: No apparent distress alert and oriented  x3 mood and affect normal, dressed appropriately.  HEENT: Pupils equal, extraocular movements intact  Respiratory: Patient's speak in full sentences and does not appear short of breath  Cardiovascular: No lower extremity edema, non tender, no erythema  Skin: Warm dry intact with no signs of infection or rash on extremities or on axial skeleton.  Abdomen: Soft nontender  Neuro: Cranial nerves II through XII are intact, neurovascularly intact in all extremities with 2+ DTRs and 2+ pulses.  Lymph: No lymphadenopathy of posterior or anterior cervical chain or axillae bilaterally.  Gait normal with good balance and coordination.  MSK:  Non tender with full range of motion and good stability and symmetric strength and tone of shoulders, elbows, wrist, hip and ankles bilaterally.  Knee: left Mild instability in patient does have small effusion noted Tenderness diffusely. Negative Mcmurray's, Apley's, and Thessalonian tests. Mild painful patellar compression. Patellar glide with moderate crepitus. Patellar and quadriceps tendons unremarkable. Hamstring and quadriceps strength is normal.  Contralateral knee unremarkable  After informed written and verbal consent, patient was seated on exam table. Left knee was prepped with alcohol swab and utilizing anterolateral approach, patient's left knee space was injected with 4:1  marcaine 0.5%: Kenalog 40mg /dL. Patient tolerated the procedure well without immediate complications.    Impression and Recommendations:     This case required medical decision making of moderate complexity.      Note: This dictation was prepared with Dragon dictation along with smaller phrase technology. Any transcriptional errors that result from this process are unintentional.

## 2016-11-10 ENCOUNTER — Encounter: Payer: Self-pay | Admitting: Family Medicine

## 2016-11-10 ENCOUNTER — Ambulatory Visit (INDEPENDENT_AMBULATORY_CARE_PROVIDER_SITE_OTHER): Payer: BLUE CROSS/BLUE SHIELD | Admitting: Family Medicine

## 2016-11-10 DIAGNOSIS — M1712 Unilateral primary osteoarthritis, left knee: Secondary | ICD-10-CM | POA: Diagnosis not present

## 2016-11-10 MED ORDER — VENLAFAXINE HCL ER 37.5 MG PO CP24: 37.5000 mg | ORAL_CAPSULE | Freq: Every day | ORAL | 1 refills | Status: DC

## 2016-11-10 MED ORDER — DICLOFENAC SODIUM 2 % TD SOLN: 2.0000 "application " | Freq: Two times a day (BID) | TRANSDERMAL | 3 refills | Status: DC

## 2016-11-10 NOTE — Assessment & Plan Note (Signed)
Patient given injection today. Tolerated the procedure well. Discussed icing regimen. We may want to consider repeating viscous supplementation. Brace given again today secured to worsening symptoms. Started Effexor for any of the neurologic component the can be contribute in. Follow-up again in 4 weeks

## 2016-11-10 NOTE — Patient Instructions (Addendum)
Good to see you  I would consider 1-2 81 mg aspirin if you want.  pennsaid pinkie amount topically 2 times daily as needed.   Effexor 37.5 mg daily but take right away in the morning.  New brace given today  See me again in 4 weeks and if not better we have other injections.

## 2016-11-11 ENCOUNTER — Other Ambulatory Visit (INDEPENDENT_AMBULATORY_CARE_PROVIDER_SITE_OTHER): Payer: BLUE CROSS/BLUE SHIELD

## 2016-11-11 ENCOUNTER — Ambulatory Visit (INDEPENDENT_AMBULATORY_CARE_PROVIDER_SITE_OTHER): Payer: BLUE CROSS/BLUE SHIELD | Admitting: Internal Medicine

## 2016-11-11 ENCOUNTER — Encounter: Payer: Self-pay | Admitting: Internal Medicine

## 2016-11-11 VITALS — BP 128/88 | HR 76 | Ht 67.0 in | Wt 255.0 lb

## 2016-11-11 DIAGNOSIS — R202 Paresthesia of skin: Secondary | ICD-10-CM

## 2016-11-11 DIAGNOSIS — R739 Hyperglycemia, unspecified: Secondary | ICD-10-CM | POA: Diagnosis not present

## 2016-11-11 DIAGNOSIS — R0600 Dyspnea, unspecified: Secondary | ICD-10-CM

## 2016-11-11 DIAGNOSIS — Z114 Encounter for screening for human immunodeficiency virus [HIV]: Secondary | ICD-10-CM | POA: Diagnosis not present

## 2016-11-11 DIAGNOSIS — Z0000 Encounter for general adult medical examination without abnormal findings: Secondary | ICD-10-CM

## 2016-11-11 DIAGNOSIS — I10 Essential (primary) hypertension: Secondary | ICD-10-CM | POA: Diagnosis not present

## 2016-11-11 DIAGNOSIS — I872 Venous insufficiency (chronic) (peripheral): Secondary | ICD-10-CM

## 2016-11-11 LAB — LIPID PANEL
CHOLESTEROL: 172 mg/dL (ref 0–200)
HDL: 40.3 mg/dL (ref >39.00)
LDL Cholesterol: 117 mg/dL — ABNORMAL HIGH (ref 0–99)
NonHDL: 131.45
TRIGLYCERIDES: 71 mg/dL (ref 0.0–149.0)
Total CHOL/HDL Ratio: 4
VLDL: 14.2 mg/dL (ref 0.0–40.0)

## 2016-11-11 LAB — URINALYSIS, ROUTINE W REFLEX MICROSCOPIC
BILIRUBIN URINE: NEGATIVE
HGB URINE DIPSTICK: NEGATIVE
KETONES UR: NEGATIVE
Leukocytes, UA: NEGATIVE
NITRITE: NEGATIVE
RBC / HPF: NONE SEEN (ref 0–?)
Specific Gravity, Urine: 1.02 (ref 1.000–1.030)
Total Protein, Urine: NEGATIVE
UROBILINOGEN UA: 0.2 (ref 0.0–1.0)
Urine Glucose: NEGATIVE
pH: 6 (ref 5.0–8.0)

## 2016-11-11 LAB — HEPATIC FUNCTION PANEL
ALK PHOS: 47 U/L (ref 39–117)
ALT: 61 U/L — AB (ref 0–53)
AST: 31 U/L (ref 0–37)
Albumin: 4.6 g/dL (ref 3.5–5.2)
BILIRUBIN DIRECT: 0.1 mg/dL (ref 0.0–0.3)
BILIRUBIN TOTAL: 0.5 mg/dL (ref 0.2–1.2)
TOTAL PROTEIN: 7.7 g/dL (ref 6.0–8.3)

## 2016-11-11 LAB — VITAMIN B12: VITAMIN B 12: 920 pg/mL — AB (ref 211–911)

## 2016-11-11 LAB — TSH: TSH: 0.94 u[IU]/mL (ref 0.35–4.50)

## 2016-11-11 LAB — PSA: PSA: 0.37 ng/mL (ref 0.10–4.00)

## 2016-11-11 LAB — HEMOGLOBIN A1C: Hgb A1c MFr Bld: 6.1 % (ref 4.6–6.5)

## 2016-11-11 MED ORDER — ALBUTEROL SULFATE HFA 108 (90 BASE) MCG/ACT IN AERS: 2.0000 | INHALATION_SPRAY | Freq: Four times a day (QID) | RESPIRATORY_TRACT | 2 refills | Status: DC | PRN

## 2016-11-11 MED ORDER — HYDROCHLOROTHIAZIDE 12.5 MG PO CAPS: 12.5000 mg | ORAL_CAPSULE | Freq: Every day | ORAL | 3 refills | Status: DC

## 2016-11-11 NOTE — Progress Notes (Signed)
Subjective:    Patient ID: [name], male    DOB: 1965/11/27, 51 y.o.   MRN: [id number]  HPI  Here for wellness and f/u;  Overall doing ok;  Pt denies Chest pain, worsening wheezing, orthopnea, PND, worsening LE edema, palpitations, dizziness or syncope though has had mild intermittent sob, recent seen at ED with venous doppler neg for DVT, and BNP normal.   Pt denies neurological change such as new headache, facial or extremity weakness, but has had tingling to hands and feet.  Pt denies polydipsia, polyuria, or low sugar symptoms. Pt states overall good compliance with treatment and medications, good tolerability, and has been trying to follow appropriate diet.  Pt denies worsening depressive symptoms, suicidal ideation or panic. No fever, night sweats, wt loss, loss of appetite, or other constitutional symptoms.  Pt states good ability with ADL's, has low fall risk, home safety reviewed and adequate, no other significant changes in hearing or vision, and only occasionally active with exercise.   Got frustrated with insurance problem with CPAP so not using, not wanting f/u at this time. Past Medical History:  Diagnosis Date  . Arthritis    knees - no med  . Cluster headache    Hx - resolved per patient  . DVT of lower extremity (deep venous thrombosis) ([facility]) 6/26-28/2013   Xarelto - resolved - history  . HTN (hypertension)    currently no meds  . Irritability and anger    PMH of  . Kidney stone    passed stone, no surgery required.  . Rotator cuff arthropathy 2007   right  . Saddle pulmonary embolus ([facility]) 6/26-28/2013   post orthopedic surgery - resolved  . Sleep apnea 5/15   moderate OSA-has cpap but does not use it   Past Surgical History:  Procedure Laterality Date  . arthroscopic knee surgery  10/09/2011   Dr Ninfa Linden  . CARDIAC CATHETERIZATION  2007   LVdysfunction; normal coronaries  . KNEE ARTHROSCOPY WITH MEDIAL MENISECTOMY Left 09/20/2014   medial menisectomy  chondroplastytella medial plica excision  ;  Surgeon: Dorna Leitz, MD;  Location: [facility];  Service: Orthopedics;  Laterality: Left;  . ROTATOR CUFF REPAIR  2007   right  . UPPER GASTROINTESTINAL ENDOSCOPY  2007   normal  . WISDOM TOOTH EXTRACTION      reports that he has never smoked. He has never used smokeless tobacco. He reports that he drinks alcohol. He reports that he does not use drugs. family history includes Cancer in his mother; Deep vein thrombosis in his maternal grandmother; Diabetes in his brother, maternal aunt, and maternal grandmother; Heart attack (age of onset: 70) in his brother; Heart attack (age of onset: 37) in his brother; Heart disease in his maternal grandmother; Hypertension in his brother and maternal grandmother; Migraines in his brother; Stroke in his maternal grandfather and maternal grandmother; Stroke (age of onset: 82) in his brother. Allergies  Allergen Reactions  . Tramadol Other (See Comments)    insomnia   Current Outpatient Prescriptions on File Prior to Visit  Medication Sig Dispense Refill  . Diclofenac Sodium (PENNSAID) 2 % SOLN Place 2 application onto the skin 2 (two) times daily. 112 g 3  . HYDROcodone-acetaminophen (NORCO/VICODIN) 5-325 MG tablet Take 1-2 tablets by mouth every 4 (four) hours as needed for severe pain. 10 tablet 0  . ibuprofen (ADVIL,MOTRIN) 200 MG tablet Take 800 mg by mouth every 6 (six) hours as needed for headache.    [name]  Multiple Vitamins-Minerals (ONE-A-DAY ENERGY PO) Take 1 tablet by mouth daily.    . sildenafil (REVATIO) 20 MG tablet TAKE 3 TABLETS BY MOUTH DAILY AS NEEDED. 50 tablet 0  . venlafaxine XR (EFFEXOR XR) 37.5 MG 24 hr capsule Take 1 capsule (37.5 mg total) by mouth daily with breakfast. 30 capsule 1   No current facility-administered medications on file prior to visit.    Review of Systems Constitutional: Negative for other unusual diaphoresis, sweats, appetite or weight changes HENT:  Negative for other worsening hearing loss, ear pain, facial swelling, mouth sores or neck stiffness.   Eyes: Negative for other worsening pain, redness or other visual disturbance.  Respiratory: Negative for other stridor or swelling Cardiovascular: Negative for other palpitations or other chest pain  Gastrointestinal: Negative for worsening diarrhea or loose stools, blood in stool, distention or other pain Genitourinary: Negative for hematuria, flank pain or other change in urine volume.  Musculoskeletal: Negative for myalgias or other joint swelling.  Skin: Negative for other color change, or other wound or worsening drainage.  Neurological: Negative for other syncope or numbness. Hematological: Negative for other adenopathy or swelling Psychiatric/Behavioral: Negative for hallucinations, other worsening agitation, SI, self-injury, or new decreased concentration All other system neg per pt    Objective:   Physical Exam BP 128/88   Pulse 76   Ht 5\' 7"  (1.702 m)   Wt 255 lb (115.7 kg)   SpO2 97%   BMI 39.94 kg/m  VS noted, morbid obese Constitutional: Pt is oriented to person, place, and time. Appears well-developed and well-nourished, in no significant distress and comfortable Head: Normocephalic and atraumatic  Eyes: Conjunctivae and EOM are normal. Pupils are equal, round, and reactive to light Right Ear: External ear normal without discharge Left Ear: External ear normal without discharge Nose: Nose without discharge or deformity Mouth/Throat: Oropharynx is without other ulcerations and moist  Neck: Normal range of motion. Neck supple. No JVD present. No tracheal deviation present or significant neck LA or mass Cardiovascular: Normal rate, regular rhythm, normal heart sounds and intact distal pulses.   Pulmonary/Chest: WOB normal and breath sounds decreased without rales or wheezing  Abdominal: Soft. Bowel sounds are normal. NT. No HSM  Musculoskeletal: Normal range of motion.  Exhibits chronic 2+  LE edema to knees Lymphadenopathy: Has no other cervical adenopathy.  Neurological: Pt is alert and oriented to person, place, and time. Pt has normal reflexes. No cranial nerve deficit. Motor grossly intact, Gait intact Skin: Skin is warm and dry. No rash noted or new ulcerations Psychiatric:  Has normal mood and affect. Behavior is normal without agitation No other exam findings       Assessment & Plan:

## 2016-11-11 NOTE — Patient Instructions (Signed)
Please take all new medication as prescribed - the mild fluid pill, and the inhaler to try if needed  You are given the prescription for the compression stockings to go to [place] if you like  Please continue all other medications as before, and refills have been done if requested.  Please have the pharmacy call with any other refills you may need.  Please continue your efforts at being more active, low cholesterol diet, and weight control.  You are otherwise up to date with prevention measures today.  Please keep your appointments with your specialists as you may have planned  Please go to the LAB in the Basement (turn left off the elevator) for the tests to be done today  You will be contacted by phone if any changes need to be made immediately.  Otherwise, you will receive a letter about your results with an explanation, but please check with MyChart first.  Please remember to sign up for MyChart if you have not done so, as this will be important to you in the future with finding out test results, communicating by private email, and scheduling acute appointments online when needed.  Please return in 1 year for your yearly visit, or sooner if needed, with Lab testing done 3-5 days before

## 2016-11-12 LAB — HIV ANTIBODY (ROUTINE TESTING W REFLEX): HIV: NONREACTIVE

## 2016-11-14 NOTE — Assessment & Plan Note (Signed)

## 2016-11-14 NOTE — Assessment & Plan Note (Signed)
Differentia, including obesity hypovent, deconditioning, or even asthma, for trial albuterol mdi prn

## 2016-11-14 NOTE — Assessment & Plan Note (Signed)
For b12 check with labs

## 2016-11-14 NOTE — Assessment & Plan Note (Signed)
stable overall by history and exam, recent data reviewed with pt, and pt to continue medical treatment as before,  to f/u any worsening symptoms or concerns BP Readings from Last 3 Encounters:  11/11/16 128/88  11/10/16 140/90  11/07/16 (!) 136/92

## 2016-11-14 NOTE — Assessment & Plan Note (Signed)
For hct 12.5 qd, compression stocking, leg elevation, low salt, wt loss

## 2016-11-14 NOTE — Assessment & Plan Note (Signed)
stable overall by history and exam, recent data reviewed with pt, and pt to continue medical treatment as before,  to f/u any worsening symptoms or concerns Lab Results  Component Value Date   HGBA1C 6.1 11/11/2016

## 2016-11-26 ENCOUNTER — Other Ambulatory Visit: Payer: Self-pay | Admitting: Internal Medicine

## 2016-12-09 ENCOUNTER — Ambulatory Visit: Payer: BLUE CROSS/BLUE SHIELD | Admitting: Family Medicine

## 2016-12-12 ENCOUNTER — Telehealth: Payer: Self-pay | Admitting: Family Medicine

## 2016-12-12 NOTE — Telephone Encounter (Signed)
Pt called stating Dr. Tamala Julian told him he would refill his HYDROcodone-acetaminophen (NORCO/VICODIN) 5-325 MG tablet  For him. I told him I thought Dr. Tamala Julian did not prescribe Narcotics and he insisted Dr. Tamala Julian had done it before for him . I called Sports medicine and Dr. Tamala Julian answered and he said he did not say that.  I talked to the Pt again and said Dr. Tamala Julian does not prescribe narcotics and he got frustrated and said Dr. Tamala Julian told me he would do it for me. I told him I would send a message to his assistant.

## 2016-12-15 NOTE — Telephone Encounter (Signed)
Dr. Tamala Julian recommends that patient follow-up as indicated in his last visit. Left message for patient in regards to scheduling a 4 week follow-up appointment with Dr. Tamala Julian.

## 2016-12-16 ENCOUNTER — Encounter: Payer: BLUE CROSS/BLUE SHIELD | Admitting: Internal Medicine

## 2017-01-13 ENCOUNTER — Encounter: Payer: Self-pay | Admitting: Family Medicine

## 2017-01-13 ENCOUNTER — Ambulatory Visit (INDEPENDENT_AMBULATORY_CARE_PROVIDER_SITE_OTHER): Payer: BLUE CROSS/BLUE SHIELD | Admitting: Family Medicine

## 2017-01-13 DIAGNOSIS — M1712 Unilateral primary osteoarthritis, left knee: Secondary | ICD-10-CM

## 2017-01-13 MED ORDER — VENLAFAXINE HCL ER 75 MG PO CP24: 75.0000 mg | ORAL_CAPSULE | Freq: Every day | ORAL | 1 refills | Status: DC

## 2017-01-13 NOTE — Assessment & Plan Note (Signed)
Worsening pain, failed all conservative therapy and patient will start on viscous supplementation today. Patient did have some relief with it previously. Patient was to go back to the custom brace. Patient will follow-up in one week for second in a series of 4 injections.

## 2017-01-13 NOTE — Patient Instructions (Addendum)
Good to see you  Ice 20 minutes 2 times daily. Usually after activity and before bed. Increase effexor to 75mg  daily  Wear the bigger brace See me again in 1 week for 2nd injection

## 2017-01-13 NOTE — Progress Notes (Signed)
[name] [address], [place] [address] Phone: [phone] Subjective:     CC: Bilateral knee pain f/u  [id number]  [name] is a 51 y.o. male coming in with complaint of bilateral knee pain. Past medical history significant for deep venous thrombosis. Worsening left knee pain. Describes pain as a dull, throbbing aching sensation. Patient states worsening symptoms at this time. Patient states some increasing instability. Not using the custom brace on a regular time. Increasing swelling. Affecting daily activities including his job.    Past Medical History:  Diagnosis Date  . Arthritis    knees - no med  . Cluster headache    Hx - resolved per patient  . DVT of lower extremity (deep venous thrombosis) ([facility]) 6/26-28/2013   Xarelto - resolved - history  . HTN (hypertension)    currently no meds  . Irritability and anger    PMH of  . Kidney stone    passed stone, no surgery required.  . Rotator cuff arthropathy 2007   right  . Saddle pulmonary embolus ([facility]) 6/26-28/2013   post orthopedic surgery - resolved  . Sleep apnea 5/15   moderate OSA-has cpap but does not use it   Past Surgical History:  Procedure Laterality Date  . arthroscopic knee surgery  10/09/2011   Dr Ninfa Linden  . CARDIAC CATHETERIZATION  2007   LVdysfunction; normal coronaries  . KNEE ARTHROSCOPY WITH MEDIAL MENISECTOMY Left 09/20/2014   medial menisectomy chondroplastytella medial plica excision  ;  Surgeon: Dorna Leitz, MD;  Location: [facility];  Service: Orthopedics;  Laterality: Left;  . ROTATOR CUFF REPAIR  2007   right  . UPPER GASTROINTESTINAL ENDOSCOPY  2007   normal  . WISDOM TOOTH EXTRACTION     Social History   Social History  . Marital status: Single    Spouse name: N/A  . Number of children: 6  . Years of education: 52   Occupational History  . Glass blower/designer    . [employer]    Social History Main Topics  .  Smoking status: Never Smoker  . Smokeless tobacco: Never Used  . Alcohol use 0.0 oz/week     Comment: Stopped drinking 10/2015  . Drug use: No  . Sexual activity: Not Asked   Other Topics Concern  . None   Social History Narrative   Fun: Exercise   Allergies  Allergen Reactions  . Tramadol Other (See Comments)    insomnia   Family History  Problem Relation Age of Onset  . Cancer Mother        Mesothelioma   . Diabetes Brother   . Hypertension Brother   . Stroke Brother 9  . Heart attack Brother 60  . Diabetes Maternal Aunt   . Diabetes Maternal Grandmother   . Heart disease Maternal Grandmother   . Hypertension Maternal Grandmother   . Stroke Maternal Grandmother        in 62s  . Deep vein thrombosis Maternal Grandmother   . Stroke Maternal Grandfather        > 55  . Migraines Brother   . Heart attack Brother 43  . Colon cancer Neg Hx   . Esophageal cancer Neg Hx   . Rectal cancer Neg Hx   . Stomach cancer Neg Hx     Past medical history, social, surgical and family history all reviewed in electronic medical record.  No pertanent information unless stated regarding to  the chief complaint.   Review of Systems:Review of systems updated and as accurate as of 01/13/17  No headache, visual changes, nausea, vomiting, diarrhea, constipation, dizziness, abdominal pain, skin rash, fevers, chills, night sweats, weight loss, swollen lymph nodes, body aches, chest pain, shortness of breath, mood changes.  Positive muscle aches and joint swelling  Objective  Blood pressure 122/84, pulse 88, height 5\' 7"  (1.702 m), weight 258 lb (117 kg), SpO2 94 %. Systems examined below as of 01/13/17   General: No apparent distress alert and oriented x3 mood and affect normal, dressed appropriately.  HEENT: Pupils equal, extraocular movements intact  Respiratory: Patient's speak in full sentences and does not appear short of breath  Cardiovascular: No lower extremity edema, non tender, no  erythema  Skin: Warm dry intact with no signs of infection or rash on extremities or on axial skeleton.  Abdomen: Soft nontender  Neuro: Cranial nerves II through XII are intact, neurovascularly intact in all extremities with 2+ DTRs and 2+ pulses.  Lymph: No lymphadenopathy of posterior or anterior cervical chain or axillae bilaterally.  Gait normal with good balance and coordination.  MSK:  Non tender with full range of motion and good stability and symmetric strength and tone of shoulders, elbows, wrist, hip and ankles bilaterally.  Knee: left Mild instability in patient does have small effusion noted Tenderness diffusely. Negative Mcmurray's, Apley's, and Thessalonian tests. Mild painful patellar compression. Patellar glide with moderate crepitus. Patellar and quadriceps tendons unremarkable. Hamstring and quadriceps strength is normal.  Contralateral knee unremarkable  After informed written and verbal consent, patient was seated on exam table. Left knee was prepped with alcohol swab and utilizing anterolateral approach, patient's left knee space was injected with 16 mg/2.5 mL of Synvisc (sodium hyaluronate) in a prefilled syringe was injected easily into the knee through a 22-gauge needle.. Patient tolerated the procedure well without immediate complications.    Impression and Recommendations:     This case required medical decision making of moderate complexity.      Note: This dictation was prepared with Dragon dictation along with smaller phrase technology. Any transcriptional errors that result from this process are unintentional.

## 2017-01-20 ENCOUNTER — Encounter: Payer: Self-pay | Admitting: Internal Medicine

## 2017-01-20 ENCOUNTER — Encounter: Payer: Self-pay | Admitting: Family Medicine

## 2017-01-20 ENCOUNTER — Ambulatory Visit (INDEPENDENT_AMBULATORY_CARE_PROVIDER_SITE_OTHER): Payer: BLUE CROSS/BLUE SHIELD | Admitting: Internal Medicine

## 2017-01-20 ENCOUNTER — Ambulatory Visit (INDEPENDENT_AMBULATORY_CARE_PROVIDER_SITE_OTHER): Payer: BLUE CROSS/BLUE SHIELD | Admitting: Family Medicine

## 2017-01-20 VITALS — BP 122/76 | HR 97 | Temp 98.3°F | Ht 67.0 in | Wt 257.0 lb

## 2017-01-20 DIAGNOSIS — I10 Essential (primary) hypertension: Secondary | ICD-10-CM | POA: Diagnosis not present

## 2017-01-20 DIAGNOSIS — N2889 Other specified disorders of kidney and ureter: Secondary | ICD-10-CM | POA: Diagnosis not present

## 2017-01-20 DIAGNOSIS — M1712 Unilateral primary osteoarthritis, left knee: Secondary | ICD-10-CM

## 2017-01-20 DIAGNOSIS — L989 Disorder of the skin and subcutaneous tissue, unspecified: Secondary | ICD-10-CM

## 2017-01-20 DIAGNOSIS — R0600 Dyspnea, unspecified: Secondary | ICD-10-CM | POA: Diagnosis not present

## 2017-01-20 NOTE — Assessment & Plan Note (Signed)
stable overall by history and exam, recent data reviewed with pt, and pt to continue medical treatment as before,  to f/u any worsening symptoms or concerns BP Readings from Last 3 Encounters:  01/20/17 122/76  01/20/17 116/82  01/13/17 122/84

## 2017-01-20 NOTE — Assessment & Plan Note (Signed)
Likely cyst, for renal u/s r/o mass vs cyst

## 2017-01-20 NOTE — Assessment & Plan Note (Signed)
I suspect possible obesity related restrictive type issue, but cant r/o other; for albut MDI prn trial, for PFT's, wt control efforts

## 2017-01-20 NOTE — Assessment & Plan Note (Signed)
Most likely c/w benign mass such as dermatofibroma or similar, ok to follow for any worsening size

## 2017-01-20 NOTE — Progress Notes (Signed)
Subjective:    Patient ID: [name], male    DOB: Oct 17, 1965, 51 y.o.   MRN: [id number]  HPI  Here to f/u; overall doing ok,  Pt denies chest pain, orthopnea, PND, increased LE swelling, palpitations, dizziness or syncope, but still with intermittent dyspnea without fever, ST, cough or wheezing. Did not try the albuterol MDI  Recent CT chest June 2018 neg for acute or other significant abnormality.  Pt denies new neurological symptoms such as new headache, or facial or extremity weakness or numbness.  Pt denies polydipsia, polyuria.  Pt is very concerned about a possible renal cyst seen on most recent CT chest.  Denies urinary symptoms such as dysuria, frequency, urgency, flank pain, hematuria or n/v, fever, chills.  Has a right flank subq nodule without change he noticed in the last 2 months. Past Medical History:  Diagnosis Date  . Arthritis    knees - no med  . Cluster headache    Hx - resolved per patient  . DVT of lower extremity (deep venous thrombosis) ([facility]) 6/26-28/2013   Xarelto - resolved - history  . HTN (hypertension)    currently no meds  . Irritability and anger    PMH of  . Kidney stone    passed stone, no surgery required.  . Rotator cuff arthropathy 2007   right  . Saddle pulmonary embolus ([facility]) 6/26-28/2013   post orthopedic surgery - resolved  . Sleep apnea 5/15   moderate OSA-has cpap but does not use it   Past Surgical History:  Procedure Laterality Date  . arthroscopic knee surgery  10/09/2011   Dr Ninfa Linden  . CARDIAC CATHETERIZATION  2007   LVdysfunction; normal coronaries  . KNEE ARTHROSCOPY WITH MEDIAL MENISECTOMY Left 09/20/2014   medial menisectomy chondroplastytella medial plica excision  ;  Surgeon: Dorna Leitz, MD;  Location: [facility];  Service: Orthopedics;  Laterality: Left;  . ROTATOR CUFF REPAIR  2007   right  . UPPER GASTROINTESTINAL ENDOSCOPY  2007   normal  . WISDOM TOOTH EXTRACTION      reports that he has never  smoked. He has never used smokeless tobacco. He reports that he drinks alcohol. He reports that he does not use drugs. family history includes Cancer in his mother; Deep vein thrombosis in his maternal grandmother; Diabetes in his brother, maternal aunt, and maternal grandmother; Heart attack (age of onset: 5) in his brother; Heart attack (age of onset: 65) in his brother; Heart disease in his maternal grandmother; Hypertension in his brother and maternal grandmother; Migraines in his brother; Stroke in his maternal grandfather and maternal grandmother; Stroke (age of onset: 64) in his brother. Allergies  Allergen Reactions  . Tramadol Other (See Comments)    insomnia   Current Outpatient Prescriptions on File Prior to Visit  Medication Sig Dispense Refill  . albuterol (PROVENTIL HFA;VENTOLIN HFA) 108 (90 Base) MCG/ACT inhaler Inhale 2 puffs into the lungs every 6 (six) hours as needed for wheezing or shortness of breath. 1 Inhaler 2  . Diclofenac Sodium (PENNSAID) 2 % SOLN Place 2 application onto the skin 2 (two) times daily. 112 g 3  . hydrochlorothiazide (MICROZIDE) 12.5 MG capsule Take 1 capsule (12.5 mg total) by mouth daily. 90 capsule 3  . HYDROcodone-acetaminophen (NORCO/VICODIN) 5-325 MG tablet Take 1-2 tablets by mouth every 4 (four) hours as needed for severe pain. 10 tablet 0  . ibuprofen (ADVIL,MOTRIN) 200 MG tablet Take 800 mg by mouth every 6 (six) hours  as needed for headache.    . Multiple Vitamins-Minerals (ONE-A-DAY ENERGY PO) Take 1 tablet by mouth daily.    . sildenafil (REVATIO) 20 MG tablet TAKE THREE TABLETS BY MOUTH DAILY AS NEEDED  50 tablet 5  . venlafaxine XR (EFFEXOR-XR) 75 MG 24 hr capsule Take 1 capsule (75 mg total) by mouth daily with breakfast. 30 capsule 1   No current facility-administered medications on file prior to visit.    Review of Systems  Constitutional: Negative for other unusual diaphoresis or sweats HENT: Negative for ear discharge or  swelling Eyes: Negative for other worsening visual disturbances Respiratory: Negative for stridor or other swelling  Gastrointestinal: Negative for worsening distension or other blood Genitourinary: Negative for retention or other urinary change Musculoskeletal: Negative for other MSK pain or swelling Skin: Negative for color change or other new lesions Neurological: Negative for worsening tremors and other numbness  Psychiatric/Behavioral: Negative for worsening agitation or other fatigue All other system neg per pt    Objective:   Physical Exam BP 122/76   Pulse 97   Temp 98.3 F (36.8 C)   Ht 5\' 7"  (1.702 m)   Wt 257 lb (116.6 kg)   SpO2 98%   BMI 40.25 kg/m  VS noted, not ill appearing, morbid obese Constitutional: Pt appears in NAD HENT: Head: NCAT.  Right Ear: External ear normal.  Left Ear: External ear normal.  Eyes: . Pupils are equal, round, and reactive to light. Conjunctivae and EOM are normal Nose: without d/c or deformity Neck: Neck supple. Gross normal ROM Cardiovascular: Normal rate and regular rhythm.   Pulmonary/Chest: Effort normal and breath sounds without rales or wheezing.  Neurological: Pt is alert. At baseline orientation, motor grossly intact Skin: Skin is warm. No rashes, other new lesions, no LE edema, has 1/2 cm firm oval subq nodule right flank without overlying skin change Psychiatric: Pt behavior is normal without agitation  No other exam findings Lab Results  Component Value Date   WBC 9.8 11/06/2016   HGB 13.7 11/06/2016   HCT 41.2 11/06/2016   PLT 224 11/06/2016   GLUCOSE 96 11/06/2016   CHOL 172 11/11/2016   TRIG 71.0 11/11/2016   HDL 40.30 11/11/2016   LDLCALC 117 (H) 11/11/2016   ALT 61 (H) 11/11/2016   AST 31 11/11/2016   NA 139 11/06/2016   K 4.0 11/06/2016   CL 109 11/06/2016   CREATININE 0.91 11/06/2016   BUN 10 11/06/2016   CO2 23 11/06/2016   TSH 0.94 11/11/2016   PSA 0.37 11/11/2016   INR CANCELED 08/12/2013    HGBA1C 6.1 11/11/2016       Assessment & Plan:

## 2017-01-20 NOTE — Patient Instructions (Signed)
Please continue all other medications as before, and refills have been done if requested.  Please have the pharmacy call with any other refills you may need.  Please continue your efforts at being more active, low cholesterol diet, and weight control.  Please keep your appointments with your specialists as you may have planned  You will be contacted regarding the referral for: Kidney ultrasound to make sure if the spot on the right kidney is a cyst  Please call for a general surgury referral iif you feel the right lower back skin nodule is getting larger  You will be contacted regarding the referral for: Lung testing (PFT's)

## 2017-01-20 NOTE — Assessment & Plan Note (Signed)
Failed all conservative therapy at this time. Tendons second in a series of 3 injections for viscous supplementation. Follow-up in one week for third and final injection

## 2017-01-20 NOTE — Progress Notes (Signed)
[name] [address], [place] [address] Phone: [phone] Subjective:     CC: Bilateral knee pain f/u  OIN:[id number]  [name] is a 51 y.o. male coming in with complaint of bilateral knee pain. Past medical history significant for deep venous thrombosis. Worsening left knee pain. Patient was seen by me and was given another started on viscous supplementation. Has not notice any significant improvement. No improvement with the medication either at this time. Patient denies any worsening symptoms just no improvement at this time.    Past Medical History:  Diagnosis Date  . Arthritis    knees - no med  . Cluster headache    Hx - resolved per patient  . DVT of lower extremity (deep venous thrombosis) ([facility]) 6/26-28/2013   Xarelto - resolved - history  . HTN (hypertension)    currently no meds  . Irritability and anger    PMH of  . Kidney stone    passed stone, no surgery required.  . Rotator cuff arthropathy 2007   right  . Saddle pulmonary embolus ([facility]) 6/26-28/2013   post orthopedic surgery - resolved  . Sleep apnea 5/15   moderate OSA-has cpap but does not use it   Past Surgical History:  Procedure Laterality Date  . arthroscopic knee surgery  10/09/2011   Dr Ninfa Linden  . CARDIAC CATHETERIZATION  2007   LVdysfunction; normal coronaries  . KNEE ARTHROSCOPY WITH MEDIAL MENISECTOMY Left 09/20/2014   medial menisectomy chondroplastytella medial plica excision  ;  Surgeon: Dorna Leitz, MD;  Location: [facility];  Service: Orthopedics;  Laterality: Left;  . ROTATOR CUFF REPAIR  2007   right  . UPPER GASTROINTESTINAL ENDOSCOPY  2007   normal  . WISDOM TOOTH EXTRACTION     Social History   Social History  . Marital status: Single    Spouse name: N/A  . Number of children: 6  . Years of education: 89   Occupational History  . Glass blower/designer    . [employer]    Social History Main Topics  .  Smoking status: Never Smoker  . Smokeless tobacco: Never Used  . Alcohol use 0.0 oz/week     Comment: Stopped drinking 10/2015  . Drug use: No  . Sexual activity: Not Asked   Other Topics Concern  . None   Social History Narrative   Fun: Exercise   Allergies  Allergen Reactions  . Tramadol Other (See Comments)    insomnia   Family History  Problem Relation Age of Onset  . Cancer Mother        Mesothelioma   . Diabetes Brother   . Hypertension Brother   . Stroke Brother 17  . Heart attack Brother 7  . Diabetes Maternal Aunt   . Diabetes Maternal Grandmother   . Heart disease Maternal Grandmother   . Hypertension Maternal Grandmother   . Stroke Maternal Grandmother        in 66s  . Deep vein thrombosis Maternal Grandmother   . Stroke Maternal Grandfather        > 55  . Migraines Brother   . Heart attack Brother 45  . Colon cancer Neg Hx   . Esophageal cancer Neg Hx   . Rectal cancer Neg Hx   . Stomach cancer Neg Hx     Past medical history, social, surgical and family history all reviewed in electronic medical record.  No pertanent information unless stated  regarding to the chief complaint.   Review of Systems:Review of systems updated and as accurate as of 01/20/17  No headache, visual changes, nausea, vomiting, diarrhea, constipation, dizziness, abdominal pain, skin rash, fevers, chills, night sweats, weight loss, swollen lymph nodes, body aches, chest pain, shortness of breath, mood changes.  Positive muscle aches and joint swelling  Objective  Blood pressure 116/82, pulse 78, SpO2 95 %. Systems examined below as of 01/20/17   Systems examined below as of 01/20/17 General: NAD A&O x3 mood, affect normal  HEENT: Pupils equal, extraocular movements intact no nystagmus Respiratory: not short of breath at rest or with speaking Cardiovascular: No lower extremity edema, non tender Skin: Warm dry intact with no signs of infection or rash on extremities or on axial  skeleton. Abdomen: Soft nontender, no masses Neuro: Cranial nerves  intact, neurovascularly intact in all extremities with 2+ DTRs and 2+ pulses. Lymph: No lymphadenopathy appreciated today  Gait normal with good balance and coordination.  MSK:  Non tender with full range of motion and good stability and symmetric strength and tone of shoulders, elbows, wrist, hip and ankles bilaterally.  Knee: Left Mild lateral patellar tilt. Diffuse pain ROM full in flexion and extension and lower leg rotation. Ligaments with solid consistent endpoints including ACL, PCL, LCL, MCL. Positive Mcmurray's, Apley's, and Thessalonian tests. painful patellar compression. Patellar glide with moderate crepitus. Patellar and quadriceps tendons unremarkable. Hamstring and quadriceps strength is normal.   After informed written and verbal consent, patient was seated on exam table. Left knee was prepped with alcohol swab and utilizing anterolateral approach, patient's left knee space was injected with16 mg/2.5 mL of Synvisc (sodium hyaluronate) in a prefilled syringe was injected easily into the knee through a 22-gauge needle.. Patient tolerated the procedure well without immediate complications.   Impression and Recommendations:     This case required medical decision making of moderate complexity.      Note: This dictation was prepared with Dragon dictation along with smaller phrase technology. Any transcriptional errors that result from this process are unintentional.

## 2017-01-21 ENCOUNTER — Other Ambulatory Visit: Payer: Self-pay | Admitting: Internal Medicine

## 2017-01-21 DIAGNOSIS — N2889 Other specified disorders of kidney and ureter: Secondary | ICD-10-CM

## 2017-01-23 ENCOUNTER — Ambulatory Visit
Admission: RE | Admit: 2017-01-23 | Discharge: 2017-01-23 | Disposition: A | Discharge status: Home / Self Care | Payer: BLUE CROSS/BLUE SHIELD | Source: Ambulatory Visit | Attending: Internal Medicine | Admitting: Internal Medicine

## 2017-01-23 DIAGNOSIS — N281 Cyst of kidney, acquired: Secondary | ICD-10-CM | POA: Diagnosis not present

## 2017-01-23 DIAGNOSIS — N2889 Other specified disorders of kidney and ureter: Secondary | ICD-10-CM

## 2017-01-27 ENCOUNTER — Other Ambulatory Visit: Payer: Self-pay | Admitting: Internal Medicine

## 2017-01-27 ENCOUNTER — Telehealth: Payer: Self-pay | Admitting: Internal Medicine

## 2017-01-27 ENCOUNTER — Encounter: Payer: Self-pay | Admitting: Family Medicine

## 2017-01-27 ENCOUNTER — Ambulatory Visit (INDEPENDENT_AMBULATORY_CARE_PROVIDER_SITE_OTHER): Payer: BLUE CROSS/BLUE SHIELD | Admitting: Family Medicine

## 2017-01-27 DIAGNOSIS — N2889 Other specified disorders of kidney and ureter: Secondary | ICD-10-CM

## 2017-01-27 DIAGNOSIS — M1712 Unilateral primary osteoarthritis, left knee: Secondary | ICD-10-CM | POA: Diagnosis not present

## 2017-01-27 NOTE — Telephone Encounter (Signed)
Pt requesting to switch PCP from Dr Jenny Reichmann to Dr Ronnald Ramp.  Please advise.

## 2017-01-27 NOTE — Patient Instructions (Signed)
Done so soon! Can repeat every 6 months.  Can do another steroid if it worsens You know where I am if you need me.  Can continue to help over the course of the next month  See me again when you need me

## 2017-01-27 NOTE — Telephone Encounter (Signed)
Ok with me 

## 2017-01-27 NOTE — Progress Notes (Signed)
[name] [address] [place], [place] [address] Phone: [phone] Subjective:     CC: Bilateral knee pain f/u  [name]  ISACK [name] is a 51 y.o. male coming in with complaint of bilateral knee pain. Past medical history significant for deep venous thrombosis. Worsening left knee pain. Patient started viscous supplementation. Very minimal response to 4. No side effects.    Past Medical History:  Diagnosis Date  . Arthritis    knees - no med  . Cluster headache    Hx - resolved per patient  . DVT of lower extremity (deep venous thrombosis) ([place]) 6/26-28/2013   Xarelto - resolved - history  . HTN (hypertension)    currently no meds  . Irritability and anger    PMH of  . Kidney stone    passed stone, no surgery required.  . Rotator cuff arthropathy 2007   right  . Saddle pulmonary embolus ([place]) 6/26-28/2013   post orthopedic surgery - resolved  . Sleep apnea 5/15   moderate OSA-has cpap but does not use it   Past Surgical History:  Procedure Laterality Date  . arthroscopic knee surgery  10/09/2011   Dr Ninfa Linden  . CARDIAC CATHETERIZATION  2007   LVdysfunction; normal coronaries  . KNEE ARTHROSCOPY WITH MEDIAL MENISECTOMY Left 09/20/2014   medial menisectomy chondroplastytella medial plica excision  ;  Surgeon: Dorna Leitz, MD;  Location: [facility];  Service: Orthopedics;  Laterality: Left;  . ROTATOR CUFF REPAIR  2007   right  . UPPER GASTROINTESTINAL ENDOSCOPY  2007   normal  . WISDOM TOOTH EXTRACTION     Social History   Social History  . Marital status: Single    Spouse name: N/A  . Number of children: 6  . Years of education: 64   Occupational History  . Glass blower/designer    . [employer]    Social History Main Topics  . Smoking status: Never Smoker  . Smokeless tobacco: Never Used  . Alcohol use 0.0 oz/week     Comment: Stopped drinking 10/2015  . Drug use: No  . Sexual activity: Not  on file   Other Topics Concern  . Not on file   Social History Narrative   Fun: Exercise   Allergies  Allergen Reactions  . Tramadol Other (See Comments)    insomnia   Family History  Problem Relation Age of Onset  . Cancer Mother        Mesothelioma   . Diabetes Brother   . Hypertension Brother   . Stroke Brother 82  . Heart attack Brother 27  . Diabetes Maternal Aunt   . Diabetes Maternal Grandmother   . Heart disease Maternal Grandmother   . Hypertension Maternal Grandmother   . Stroke Maternal Grandmother        in 8s  . Deep vein thrombosis Maternal Grandmother   . Stroke Maternal Grandfather        > 55  . Migraines Brother   . Heart attack Brother 83  . Colon cancer Neg Hx   . Esophageal cancer Neg Hx   . Rectal cancer Neg Hx   . Stomach cancer Neg Hx     Past medical history, social, surgical and family history all reviewed in electronic medical record.  No pertanent information unless stated regarding to the chief complaint.   Review of Systems:Review of systems updated and as accurate as of 01/27/17  No headache, visual changes,  nausea, vomiting, diarrhea, constipation, dizziness, abdominal pain, skin rash, fevers, chills, night sweats, weight loss, swollen lymph nodes, body aches, chest pain, shortness of breath, mood changes.  Positive muscle aches and joint swelling  Objective  Weight 262 lb (118.8 kg).   Systems examined below as of 01/27/17 General: NAD A&O x3 mood, affect normal  HEENT: Pupils equal, extraocular movements intact no nystagmus Respiratory: not short of breath at rest or with speaking Cardiovascular: No lower extremity edema, non tender Skin: Warm dry intact with no signs of infection or rash on extremities or on axial skeleton. Abdomen: Soft nontender, no masses Neuro: Cranial nerves  intact, neurovascularly intact in all extremities with 2+ DTRs and 2+ pulses. Lymph: No lymphadenopathy appreciated today  GaitMild antalgic  gait MSK:  Non tender with full range of motion and good stability and symmetric strength and tone of shoulders, elbows, wrist, hip and ankles bilaterally.  Knee: Left Mild lateral patellar tilt. Mild decrease in pain from previous exam ROM full in flexion and extension and lower leg rotation. Ligaments with solid consistent endpoints including ACL, PCL, LCL, MCL. Positive Mcmurray's, Apley's, and Thessalonian tests. painful patellar compression. Patellar glide with moderate crepitus. Patellar and quadriceps tendons unremarkable. Hamstring and quadriceps strength is normal.  No change from previous exam  After informed written and verbal consent, patient was seated on exam table. Left knee was prepped with alcohol swab and utilizing anterolateral approach, patient's left knee space was injected with16 mg/2.5 mL of Synvisc (sodium hyaluronate) in a prefilled syringe was injected easily into the knee through a 22-gauge needle.. Patient tolerated the procedure well without immediate complications.   Impression and Recommendations:     This case required medical decision making of moderate complexity.      Note: This dictation was prepared with Dragon dictation along with smaller phrase technology. Any transcriptional errors that result from this process are unintentional.

## 2017-01-27 NOTE — Assessment & Plan Note (Signed)
Viscous supplementation done again. Patient has had minimal response to this previously. Hopefully patient does relatively well. Discussed icing regimen. Discussed continuing the brace which patient is going to have adjusted today. Follow-up with me again in 4 weeks

## 2017-01-28 ENCOUNTER — Encounter: Payer: Self-pay | Admitting: Internal Medicine

## 2017-01-28 ENCOUNTER — Telehealth: Payer: Self-pay | Admitting: Internal Medicine

## 2017-01-28 ENCOUNTER — Telehealth: Payer: Self-pay

## 2017-01-28 NOTE — Telephone Encounter (Signed)
-----   Message from Biagio Borg, MD sent at 01/27/2017  8:27 PM EDT ----- Left message on MyChart, pt to cont same tx except  The test results show that your current treatment is OK, except there is a spot to the mid left kidney that is uncertain.  To make sure about this, we should refer you to Urology for further evaluation.  You should be called soon.  [name] to please inform pt, I will do referral

## 2017-01-28 NOTE — Telephone Encounter (Signed)
Called pt, LVM.   

## 2017-01-28 NOTE — Telephone Encounter (Signed)
Pt called stating [facility] called stating he has a balance there so he cannot make an appointment until the balance is paid, he states he cannot afford the balance. He would like to know if his urology referral can be sent somewhere else.

## 2017-01-28 NOTE — Telephone Encounter (Signed)
Patient has called back.  I have given him Dr. Judi Cong response. Patient understands and does not have questions at this time.

## 2017-02-04 NOTE — Telephone Encounter (Signed)
Yes, okay with me.

## 2017-02-13 NOTE — Telephone Encounter (Signed)
I have tried several times over the past few days to reach pt to get him in for a transfer visit with Dr. Ronnald Ramp.  Pt's cell is not accepting messages.  Will try back next week.

## 2017-02-19 ENCOUNTER — Ambulatory Visit (INDEPENDENT_AMBULATORY_CARE_PROVIDER_SITE_OTHER): Payer: BLUE CROSS/BLUE SHIELD | Admitting: Internal Medicine

## 2017-02-19 DIAGNOSIS — R0600 Dyspnea, unspecified: Secondary | ICD-10-CM

## 2017-02-19 LAB — PULMONARY FUNCTION TEST
DL/VA % PRED: 103 %
DL/VA: 4.61 ml/min/mmHg/L
DLCO COR: 24.74 ml/min/mmHg
DLCO cor % pred: 87 %
DLCO unc % pred: 92 %
DLCO unc: 26.27 ml/min/mmHg
FEF 25-75 Post: 6.19 L/sec
FEF 25-75 Pre: 6.15 L/sec
FEF2575-%CHANGE-POST: 0 %
FEF2575-%Pred-Post: 206 %
FEF2575-%Pred-Pre: 205 %
FEV1-%Change-Post: 0 %
FEV1-%PRED-PRE: 125 %
FEV1-%Pred-Post: 124 %
FEV1-PRE: 3.74 L
FEV1-Post: 3.72 L
FEV1FVC-%Change-Post: 0 %
FEV1FVC-%Pred-Pre: 113 %
FEV6-%Change-Post: 0 %
FEV6-%PRED-PRE: 113 %
FEV6-%Pred-Post: 112 %
FEV6-PRE: 4.12 L
FEV6-Post: 4.09 L
FEV6FVC-%Change-Post: 0 %
FEV6FVC-%PRED-PRE: 102 %
FEV6FVC-%Pred-Post: 102 %
FVC-%Change-Post: 0 %
FVC-%PRED-POST: 109 %
FVC-%PRED-PRE: 110 %
FVC-POST: 4.1 L
FVC-PRE: 4.14 L
POST FEV6/FVC RATIO: 100 %
PRE FEV6/FVC RATIO: 100 %
Post FEV1/FVC ratio: 91 %
Pre FEV1/FVC ratio: 90 %
RV % PRED: 67 %
RV: 1.29 L
TLC % pred: 84 %
TLC: 5.37 L

## 2017-02-19 NOTE — Progress Notes (Signed)
PFT done today. 

## 2017-02-20 ENCOUNTER — Encounter: Payer: BLUE CROSS/BLUE SHIELD | Admitting: Pharmacist

## 2017-02-20 DIAGNOSIS — N2889 Other specified disorders of kidney and ureter: Secondary | ICD-10-CM | POA: Diagnosis not present

## 2017-02-24 ENCOUNTER — Ambulatory Visit: Payer: BLUE CROSS/BLUE SHIELD | Admitting: Internal Medicine

## 2017-03-09 DIAGNOSIS — N2889 Other specified disorders of kidney and ureter: Secondary | ICD-10-CM | POA: Diagnosis not present

## 2017-03-09 DIAGNOSIS — N281 Cyst of kidney, acquired: Secondary | ICD-10-CM | POA: Diagnosis not present

## 2017-03-18 ENCOUNTER — Other Ambulatory Visit: Payer: Self-pay | Admitting: Geriatric Medicine

## 2017-03-18 DIAGNOSIS — N2889 Other specified disorders of kidney and ureter: Secondary | ICD-10-CM

## 2017-03-25 ENCOUNTER — Encounter: Payer: Self-pay | Admitting: Internal Medicine

## 2017-03-25 ENCOUNTER — Ambulatory Visit (INDEPENDENT_AMBULATORY_CARE_PROVIDER_SITE_OTHER): Payer: BLUE CROSS/BLUE SHIELD | Admitting: Internal Medicine

## 2017-03-25 VITALS — BP 138/78 | HR 76 | Temp 97.9°F | Resp 16 | Ht 67.0 in | Wt 267.0 lb

## 2017-03-25 DIAGNOSIS — D6851 Activated protein C resistance: Secondary | ICD-10-CM | POA: Diagnosis not present

## 2017-03-25 DIAGNOSIS — I10 Essential (primary) hypertension: Secondary | ICD-10-CM | POA: Diagnosis not present

## 2017-03-25 DIAGNOSIS — R0600 Dyspnea, unspecified: Secondary | ICD-10-CM | POA: Diagnosis not present

## 2017-03-25 NOTE — Patient Instructions (Signed)

## 2017-03-25 NOTE — Progress Notes (Signed)
Subjective:  Patient ID: [name], male    DOB: 19-Sep-1965  Age: 51 y.o. MRN: [id number]  CC: Hypertension   HPI [name] presents for a BP check - he has been suffering with intermittent episodes of shortness of breath and DOE for several years.  Sometimes the symptoms are so severe he has to be transported to the ED.  He does admit that during these episodes he feels nervous and panicky.  He has had an extensive cardiovascular and pulmonary workup.  He most recently underwent PFTs which were within normal limits.  His previous provider had prescribed albuterol but he has not tried it yet.  He is currently being evaluated for a cyst in the right kidney and is scheduled for an MRI within the next few weeks.  He is heterozygous for factor V Leiden mutation but has been told that he does not need to be on anticoagulation he does not want to take an anticoagulant.  Outpatient Medications Prior to Visit  Medication Sig Dispense Refill  . Diclofenac Sodium (PENNSAID) 2 % SOLN Place 2 application onto the skin 2 (two) times daily. 112 g 3  . ibuprofen (ADVIL,MOTRIN) 200 MG tablet Take 800 mg by mouth every 6 (six) hours as needed for headache.    . Multiple Vitamins-Minerals (ONE-A-DAY ENERGY PO) Take 1 tablet by mouth daily.    . sildenafil (REVATIO) 20 MG tablet TAKE THREE TABLETS BY MOUTH DAILY AS NEEDED  50 tablet 5  . albuterol (PROVENTIL HFA;VENTOLIN HFA) 108 (90 Base) MCG/ACT inhaler Inhale 2 puffs into the lungs every 6 (six) hours as needed for wheezing or shortness of breath. 1 Inhaler 2  . hydrochlorothiazide (MICROZIDE) 12.5 MG capsule Take 1 capsule (12.5 mg total) by mouth daily. 90 capsule 3  . HYDROcodone-acetaminophen (NORCO/VICODIN) 5-325 MG tablet Take 1-2 tablets by mouth every 4 (four) hours as needed for severe pain. 10 tablet 0  . venlafaxine XR (EFFEXOR-XR) 75 MG 24 hr capsule Take 1 capsule (75 mg total) by mouth daily with breakfast. 30 capsule 1   No  facility-administered medications prior to visit.     ROS Review of Systems  Constitutional: Negative.  Negative for chills, diaphoresis, fatigue and fever.  HENT: Negative.  Negative for trouble swallowing.   Eyes: Negative.   Respiratory: Positive for shortness of breath. Negative for cough, choking, chest tightness, wheezing and stridor.   Cardiovascular: Negative.  Negative for chest pain, palpitations and leg swelling.  Gastrointestinal: Negative for abdominal pain, constipation, diarrhea, nausea and vomiting.  Endocrine: Negative.   Genitourinary: Negative.  Negative for difficulty urinating.  Musculoskeletal: Negative.   Skin: Negative.   Allergic/Immunologic: Negative.   Neurological: Negative.  Negative for dizziness, weakness and light-headedness.  Hematological: Negative for adenopathy. Does not bruise/bleed easily.  Psychiatric/Behavioral: Negative.     Objective:  BP 138/78 (BP Location: Left Arm, Patient Position: Sitting, Cuff Size: Large)   Pulse 76   Temp 97.9 F (36.6 C) (Oral)   Resp 16   Ht 5\' 7"  (1.702 m)   Wt 267 lb (121.1 kg)   SpO2 98%   BMI 41.82 kg/m   BP Readings from Last 3 Encounters:  03/25/17 138/78  01/27/17 136/88  01/20/17 122/76    Wt Readings from Last 3 Encounters:  03/25/17 267 lb (121.1 kg)  01/27/17 262 lb (118.8 kg)  01/20/17 257 lb (116.6 kg)    Physical Exam  Constitutional: He is oriented to person, place, and time. No distress.  HENT:  Mouth/Throat: Oropharynx is clear and moist. No oropharyngeal exudate.  Eyes: Conjunctivae are normal. Right eye exhibits no discharge. Left eye exhibits no discharge. No scleral icterus.  Neck: Normal range of motion. Neck supple. No JVD present. No thyromegaly present.  Cardiovascular: Normal rate, regular rhythm and intact distal pulses.  Exam reveals no gallop and no friction rub.   No murmur heard. Pulmonary/Chest: Effort normal and breath sounds normal. No respiratory distress. He  has no wheezes. He has no rales. He exhibits no tenderness.  Abdominal: Soft. Bowel sounds are normal. He exhibits no distension and no mass. There is no tenderness. There is no rebound and no guarding.  Musculoskeletal: Normal range of motion. He exhibits no edema, tenderness or deformity.  Lymphadenopathy:    He has no cervical adenopathy.  Neurological: He is alert and oriented to person, place, and time.  Skin: Skin is warm and dry. No rash noted. He is not diaphoretic. No erythema. No pallor.  Psychiatric: He has a normal mood and affect. His behavior is normal. Judgment and thought content normal.  Vitals reviewed.   Lab Results  Component Value Date   WBC 9.8 11/06/2016   HGB 13.7 11/06/2016   HCT 41.2 11/06/2016   PLT 224 11/06/2016   GLUCOSE 96 11/06/2016   CHOL 172 11/11/2016   TRIG 71.0 11/11/2016   HDL 40.30 11/11/2016   LDLCALC 117 (H) 11/11/2016   ALT 61 (H) 11/11/2016   AST 31 11/11/2016   NA 139 11/06/2016   K 4.0 11/06/2016   CL 109 11/06/2016   CREATININE 0.91 11/06/2016   BUN 10 11/06/2016   CO2 23 11/06/2016   TSH 0.94 11/11/2016   PSA 0.37 11/11/2016   INR CANCELED 08/12/2013   HGBA1C 6.1 11/11/2016    US Renal  Result Date: 01/24/2017 CLINICAL DATA:  Possible cyst on chest CT. EXAM: RENAL / URINARY TRACT ULTRASOUND COMPLETE COMPARISON:  None. FINDINGS: Right Kidney: Length: 11.3 cm. The right kidney contains a 2 x 2.3 x 2.4 cm cyst in the upper pole correlating with recent CT imaging. Left Kidney: Length: 10.6 cm. The left kidney contains a 2.3 x 2.9 x 1.8 cm rounded echogenic region on image 31 with mild posterior shadowing. Bladder: Appears normal for degree of bladder distention. IMPRESSION: 1. There is an ill defined 2.3 x 2.9 x 1.8 cm echogenic solid region in the midpole of the left kidney with mild posterior shadowing. This is nonspecific. This could be better evaluated with a CT scan or MRI. 2. There is a cyst in the upper pole of the right kidney  which correlates with CT imaging. These results will be called to the ordering clinician or representative by the Radiologist Assistant, and communication documented in the PACS or zVision Dashboard. Electronically Signed   By: Dorise Bullion III M.D   On: 01/24/2017 07:21    Assessment & Plan:   Terreon was seen today for hypertension.  Diagnoses and all orders for this visit:  Essential hypertension- his blood pressure is adequately well controlled.  Recent electrolytes and renal function were normal.  Will continue the diuretic.  Dyspnea, unspecified type- he has had a thorough workup with respect to this.  His symptoms sound like they are related to anxiety/panic.  He was offered reassurance.  He does not want to treat the anxiety.  Heterozygous factor V Leiden mutation (HCC)-no anticoagulation at his request.   I have discontinued Mr. Flannagan HYDROcodone-acetaminophen, hydrochlorothiazide, albuterol, and venlafaxine XR. I am  also having him maintain his ibuprofen, Multiple Vitamins-Minerals (ONE-A-DAY ENERGY PO), Diclofenac Sodium, and sildenafil.  No orders of the defined types were placed in this encounter.    Follow-up: Return in about 6 months (around 09/23/2017).  Scarlette Calico, MD

## 2017-03-29 ENCOUNTER — Ambulatory Visit
Admission: RE | Admit: 2017-03-29 | Discharge: 2017-03-29 | Disposition: A | Discharge status: Home / Self Care | Payer: BLUE CROSS/BLUE SHIELD | Source: Ambulatory Visit | Attending: Geriatric Medicine | Admitting: Geriatric Medicine

## 2017-03-29 DIAGNOSIS — N281 Cyst of kidney, acquired: Secondary | ICD-10-CM | POA: Diagnosis not present

## 2017-03-29 DIAGNOSIS — D3501 Benign neoplasm of right adrenal gland: Secondary | ICD-10-CM | POA: Diagnosis not present

## 2017-03-29 DIAGNOSIS — N2889 Other specified disorders of kidney and ureter: Secondary | ICD-10-CM

## 2017-03-29 MED ORDER — GADOBENATE DIMEGLUMINE 529 MG/ML IV SOLN
20.0000 mL | Freq: Once | INTRAVENOUS | Status: AC | PRN
  Administered 2017-03-29: 20 mL via INTRAVENOUS

## 2017-06-06 NOTE — Progress Notes (Signed)
[name] [address] [place], [place] [address] Phone: [phone] Subjective:    CC: knee pain follow up   WLN:[id number]  [name] is a 52 y.o. male coming in with complaint of bilateral knee pain.  Visco supplementation in August 2018.  Now starting to have some instability and affecting work.  More instability on the left than the right.  The patient has had to wear the custom brace on a more regular doses.     Past Medical History:  Diagnosis Date  . Arthritis    knees - no med  . Cluster headache    Hx - resolved per patient  . DVT of lower extremity (deep venous thrombosis) ([facility] [facility]) 6/26-28/2013   Xarelto - resolved - history  . HTN (hypertension)    currently no meds  . Irritability and anger    PMH of  . Kidney stone    passed stone, no surgery required.  . Rotator cuff arthropathy 2007   right  . Saddle pulmonary embolus ([facility]) 6/26-28/2013   post orthopedic surgery - resolved  . Sleep apnea 5/15   moderate OSA-has cpap but does not use it   Past Surgical History:  Procedure Laterality Date  . arthroscopic knee surgery  10/09/2011   Dr Ninfa Linden  . CARDIAC CATHETERIZATION  2007   LVdysfunction; normal coronaries  . KNEE ARTHROSCOPY WITH MEDIAL MENISECTOMY Left 09/20/2014   medial menisectomy chondroplastytella medial plica excision  ;  Surgeon: Dorna Leitz, MD;  Location: [facility];  Service: Orthopedics;  Laterality: Left;  . ROTATOR CUFF REPAIR  2007   right  . UPPER GASTROINTESTINAL ENDOSCOPY  2007   normal  . WISDOM TOOTH EXTRACTION     Social History   Socioeconomic History  . Marital status: Single    Spouse name: None  . Number of children: 6  . Years of education: 70  . Highest education level: None  Social Needs  . Financial resource strain: None  . Food insecurity - worry: None  . Food insecurity - inability: None  . Transportation needs - medical: None  . Transportation needs -  non-medical: None  Occupational History  . Occupation: Glass blower/designer   . Occupation: Programmer, systems  Tobacco Use  . Smoking status: Never Smoker  . Smokeless tobacco: Never Used  Substance and Sexual Activity  . Alcohol use: Yes    Alcohol/week: 0.0 oz    Comment: Stopped drinking 10/2015  . Drug use: No  . Sexual activity: None  Other Topics Concern  . None  Social History Narrative   Fun: Exercise   Allergies  Allergen Reactions  . Tramadol Other (See Comments)    insomnia   Family History  Problem Relation Age of Onset  . Cancer Mother        Mesothelioma   . Diabetes Brother   . Hypertension Brother   . Stroke Brother 67  . Heart attack Brother 60  . Diabetes Maternal Aunt   . Diabetes Maternal Grandmother   . Heart disease Maternal Grandmother   . Hypertension Maternal Grandmother   . Stroke Maternal Grandmother        in 21s  . Deep vein thrombosis Maternal Grandmother   . Stroke Maternal Grandfather        > 55  . Migraines Brother   . Heart attack Brother 46  . Colon cancer Neg Hx   . Esophageal cancer Neg Hx   .  Rectal cancer Neg Hx   . Stomach cancer Neg Hx      Past medical history, social, surgical and family history all reviewed in electronic medical record.  No pertanent information unless stated regarding to the chief complaint.   Review of Systems:Review of systems updated and as accurate as of 06/08/17  No headache, visual changes, nausea, vomiting, diarrhea, constipation, dizziness, abdominal pain, skin rash, fevers, chills, night sweats, weight loss, swollen lymph nodes, body aches,chest pain, shortness of breath, mood changes.  Muscle aches and joint swelling  Objective  Blood pressure (!) 152/98, pulse 85, height 5\' 7"  (1.702 m), weight 261 lb (118.4 kg), SpO2 94 %. Systems examined below as of 06/08/17   General: No apparent distress alert and oriented x3 mood and affect normal, dressed appropriately.  HEENT: Pupils equal,  extraocular movements intact  Respiratory: Patient's speak in full sentences and does not appear short of breath  Cardiovascular: No lower extremity edema, non tender, no erythema  Skin: Warm dry intact with no signs of infection or rash on extremities or on axial skeleton.  Abdomen: Soft nontender  Neuro: Cranial nerves II through XII are intact, neurovascularly intact in all extremities with 2+ DTRs and 2+ pulses.  Lymph: No lymphadenopathy of posterior or anterior cervical chain or axillae bilaterally.  Gait abnormality of gait MSK:  Non tender with full range of motion and good stability and symmetric strength and tone of shoulders, elbows, wrist, hip and ankles bilaterally.  Knee: Bilateral valgus deformity noted. Large thigh to calf ratio.  Tender to palpation over medial and PF joint line.  ROM full in flexion and extension and lower leg rotation. instability with valgus force.  painful patellar compression. Patellar glide with moderate crepitus. Patellar and quadriceps tendons unremarkable. Hamstring and quadriceps strength is normal.  After informed written and verbal consent, patient was seated on exam table. Right knee was prepped with alcohol swab and utilizing anterolateral approach, patient's right knee space was injected with 4:1  marcaine 0.5%: Kenalog 40mg /dL. Patient tolerated the procedure well without immediate complications.  After informed written and verbal consent, patient was seated on exam table. Left knee was prepped with alcohol swab and utilizing anterolateral approach, patient's left knee space was injected with 4:1  marcaine 0.5%: Kenalog 40mg /dL. Patient tolerated the procedure well without immediate complications.   Impression and Recommendations:     This case required medical decision making of moderate complexity.      Note: This dictation was prepared with Dragon dictation along with smaller phrase technology. Any transcriptional errors that result  from this process are unintentional.

## 2017-06-08 ENCOUNTER — Ambulatory Visit (INDEPENDENT_AMBULATORY_CARE_PROVIDER_SITE_OTHER): Payer: BLUE CROSS/BLUE SHIELD | Admitting: Family Medicine

## 2017-06-08 ENCOUNTER — Encounter: Payer: Self-pay | Admitting: Family Medicine

## 2017-06-08 DIAGNOSIS — M170 Bilateral primary osteoarthritis of knee: Secondary | ICD-10-CM | POA: Diagnosis not present

## 2017-06-08 NOTE — Assessment & Plan Note (Signed)
Bilateral injections given today.  Tolerated the procedure well.  We discussed icing and home exercises.  Patient is to increase activity slowly over the course of next several days.  Patient could have another repeat of Visco supplementation again in 3 months.  Patient will follow up with me again at that time.

## 2017-06-08 NOTE — Patient Instructions (Signed)
Good to see you  [name] is your friend.  Stay active New shoes and lose the weight Can repeat the other injections February 28th  See me again then or so

## 2017-07-20 ENCOUNTER — Telehealth: Payer: Self-pay | Admitting: Internal Medicine

## 2017-07-20 NOTE — Telephone Encounter (Signed)
Copied from [facility] [id number]. Topic: Quick Communication - Rx Refill/Question >> Jul 20, 2017  4:52 PM Percell Belt A wrote: Medication: sildenafil (REVATIO) 20 MG tablet [[id number]]    Has the patient contacted their pharmacy? {yes    (Agent: If no, request that the patient contact the pharmacy for the refill.)   Preferred Pharmacy (with phone number or street name): [facility]: Please be advised that RX refills may take up to 3 business days. We ask that you follow-up with your pharmacy.

## 2017-07-21 ENCOUNTER — Other Ambulatory Visit: Payer: Self-pay | Admitting: *Deleted

## 2017-07-21 NOTE — Telephone Encounter (Signed)
Attempted to contact pt regarding prescription refill request/pharmacy ; left message at [phone].

## 2017-07-28 MED ORDER — SILDENAFIL CITRATE 20 MG PO TABS: ORAL_TABLET | ORAL | 5 refills | Status: DC

## 2017-07-28 NOTE — Telephone Encounter (Signed)
Patient returned call, verified the pharmacy to send prescription to, updated pharmacy list in chart, prescription refilled per protocol.

## 2017-07-28 NOTE — Addendum Note (Signed)
Addended by: Matilde Sprang on: 07/28/2017 12:29 PM   Modules accepted: Orders

## 2017-08-19 ENCOUNTER — Encounter (HOSPITAL_COMMUNITY): Payer: Self-pay | Admitting: Emergency Medicine

## 2017-08-19 ENCOUNTER — Emergency Department (HOSPITAL_COMMUNITY)
Admission: EM | Admit: 2017-08-19 | Discharge: 2017-08-19 | Disposition: A | Discharge status: Home / Self Care | Payer: BLUE CROSS/BLUE SHIELD | Attending: Emergency Medicine | Admitting: Emergency Medicine

## 2017-08-19 ENCOUNTER — Emergency Department (HOSPITAL_COMMUNITY): Payer: BLUE CROSS/BLUE SHIELD

## 2017-08-19 DIAGNOSIS — Z87891 Personal history of nicotine dependence: Secondary | ICD-10-CM | POA: Diagnosis not present

## 2017-08-19 DIAGNOSIS — R079 Chest pain, unspecified: Secondary | ICD-10-CM | POA: Insufficient documentation

## 2017-08-19 DIAGNOSIS — I10 Essential (primary) hypertension: Secondary | ICD-10-CM | POA: Diagnosis not present

## 2017-08-19 DIAGNOSIS — Z79899 Other long term (current) drug therapy: Secondary | ICD-10-CM | POA: Diagnosis not present

## 2017-08-19 DIAGNOSIS — R0602 Shortness of breath: Secondary | ICD-10-CM | POA: Diagnosis not present

## 2017-08-19 DIAGNOSIS — R0789 Other chest pain: Secondary | ICD-10-CM | POA: Diagnosis not present

## 2017-08-19 LAB — CBC WITH DIFFERENTIAL/PLATELET
Basophils Absolute: 0 10*3/uL (ref 0.0–0.1)
Basophils Relative: 0 %
EOS ABS: 0.3 10*3/uL (ref 0.0–0.7)
EOS PCT: 3 %
HCT: 42.9 % (ref 39.0–52.0)
HEMOGLOBIN: 14.2 g/dL (ref 13.0–17.0)
LYMPHS ABS: 2.4 10*3/uL (ref 0.7–4.0)
Lymphocytes Relative: 25 %
MCH: 29.5 pg (ref 26.0–34.0)
MCHC: 33.1 g/dL (ref 30.0–36.0)
MCV: 89.2 fL (ref 78.0–100.0)
MONOS PCT: 8 %
Monocytes Absolute: 0.7 10*3/uL (ref 0.1–1.0)
Neutro Abs: 6.2 10*3/uL (ref 1.7–7.7)
Neutrophils Relative %: 64 %
PLATELETS: 247 10*3/uL (ref 150–400)
RBC: 4.81 MIL/uL (ref 4.22–5.81)
RDW: 14.2 % (ref 11.5–15.5)
WBC: 9.6 10*3/uL (ref 4.0–10.5)

## 2017-08-19 LAB — I-STAT TROPONIN, ED
TROPONIN I, POC: 0 ng/mL (ref 0.00–0.08)
TROPONIN I, POC: 0 ng/mL (ref 0.00–0.08)

## 2017-08-19 LAB — HEPATIC FUNCTION PANEL
ALT: 53 — AB (ref 10–40)
AST: 34 (ref 14–40)

## 2017-08-19 LAB — LIPID PANEL
Cholesterol: 175 (ref 0–200)
HDL: 61 (ref 35–70)
LDL Cholesterol: 97
TRIGLYCERIDES: 87 (ref 40–160)

## 2017-08-19 LAB — BASIC METABOLIC PANEL
Anion gap: 7 (ref 5–15)
BUN: 13 mg/dL (ref 6–20)
CO2: 24 mmol/L (ref 22–32)
CREATININE: 0.84 mg/dL (ref 0.61–1.24)
Calcium: 9 mg/dL (ref 8.9–10.3)
Chloride: 106 mmol/L (ref 101–111)
GFR calc Af Amer: >60 mL/min (ref >60)
GFR calc non Af Amer: >60 mL/min (ref >60)
GLUCOSE: 98 mg/dL (ref 65–99)
GLUCOSE: 94
Potassium: 4 mmol/L (ref 3.5–5.1)
SODIUM: 137 mmol/L (ref 135–145)

## 2017-08-19 LAB — D-DIMER, QUANTITATIVE: D-Dimer, Quant: 0.84 ug/mL-FEU — ABNORMAL HIGH (ref 0.00–0.50)

## 2017-08-19 LAB — BRAIN NATRIURETIC PEPTIDE: B Natriuretic Peptide: 26.4 pg/mL (ref 0.0–100.0)

## 2017-08-19 MED ORDER — IOPAMIDOL (ISOVUE-370) INJECTION 76%
INTRAVENOUS | Status: AC
  Administered 2017-08-19: 100 mL
  Filled 2017-08-19: qty 100

## 2017-08-19 NOTE — ED Notes (Signed)
Patient transported to X-ray 

## 2017-08-19 NOTE — Discharge Instructions (Signed)
Please follow up with cardiology. Return if worsening symptoms.

## 2017-08-19 NOTE — ED Triage Notes (Signed)
Pt here from work with c/o chest pain , pt states that he has had chest pain for years off and on had a clean cath about 6 years ago , the pain radiated to left arm today which is new

## 2017-08-19 NOTE — ED Provider Notes (Signed)
[facility] EMERGENCY DEPARTMENT Provider Note   CSN: [id number] Arrival date & time: 08/19/17  0901     History   Chief Complaint Chief Complaint  Patient presents with  . Chest Pain    HPI [name] is a 52 y.o. male.  HPI  [name] is a 52 y.o. male with history of hypertension, pulmonary embolism, sleep apnea, presents to emergency department complaining of chest pain.  Patient states he has had chest pains on and off for 15 years.  He states that he has had increased chest pain for the last week.  He states this pain comes and goes lasting anywhere from 30 minutes to 3 hours at a time.  He reports pain is in the left side, reports it feels like pressure, radiates into the left arm and has associated shortness of breath.  He states that today he had a health evaluation at work and he admitted to having this pain to the nurse and he was sent here for further evaluation.  Patient denies any recent travel or surgeries.  He does not smoke.  He does admit to intermittent swelling in the left calf and ankle but states that is not unusual for him because he had a prior knee surgery.  States it is also leg where he had a DVT in the past.  He is currently not anticoagulated.  He was given aspirin and 2 sublingual nitroglycerin for pain on the way here which did not help.  He states he had a clean cath 6 years ago.  He states he has been to his family doctor multiple times for the same with no diagnosis.   Past Medical History:  Diagnosis Date  . Arthritis    knees - no med  . Cluster headache    Hx - resolved per patient  . DVT of lower extremity (deep venous thrombosis) ([place]) 6/26-28/2013   Xarelto - resolved - history  . HTN (hypertension)    currently no meds  . Irritability and anger    PMH of  . Kidney stone    passed stone, no surgery required.  . Rotator cuff arthropathy 2007   right  . Saddle pulmonary embolus ([place]) 6/26-28/2013   post  orthopedic surgery - resolved  . Sleep apnea 5/15   moderate OSA-has cpap but does not use it    Patient Active Problem List   Diagnosis Date Noted  . Degenerative arthritis of knee, bilateral 06/08/2017  . Mass of right kidney 01/20/2017  . Dyspnea 11/11/2016  . Paresthesia of hand, bilateral 12/23/2015  . Paresthesia of foot, bilateral 12/20/2015  . Venous insufficiency of both lower extremities 10/05/2015  . Preventative health care 08/21/2015  . Heterozygous factor V Leiden mutation ([place]) 08/24/2013  . OSA (obstructive sleep apnea) 08/13/2013  . Degenerative joint disease of knee, left 01/14/2013  . Osteoarthritis of right knee 01/14/2013  . Thrombophlebitis 01/14/2013  . Personal history of pulmonary embolism 11/26/2011  . Incomplete right bundle branch block 06/18/2011  . Hyperglycemia 11/10/2008  . Hyperlipidemia 07/06/2008  . Essential hypertension 07/06/2008  . ERECTILE DYSFUNCTION, ORGANIC 01/28/2007    Past Surgical History:  Procedure Laterality Date  . arthroscopic knee surgery  10/09/2011   Dr Ninfa Linden  . CARDIAC CATHETERIZATION  2007   LVdysfunction; normal coronaries  . KNEE ARTHROSCOPY WITH MEDIAL MENISECTOMY Left 09/20/2014   medial menisectomy chondroplastytella medial plica excision  ;  Surgeon: Dorna Leitz, MD;  Location: [facility];  Service: Orthopedics;  Laterality: Left;  . ROTATOR CUFF REPAIR  2007   right  . UPPER GASTROINTESTINAL ENDOSCOPY  2007   normal  . WISDOM TOOTH EXTRACTION         Home Medications    Prior to Admission medications   Medication Sig Start Date End Date Taking? Authorizing Provider  ibuprofen (ADVIL,MOTRIN) 200 MG tablet Take 800 mg by mouth every 6 (six) hours as needed for headache.   Yes [provider]  Multiple Vitamins-Minerals (ONE-A-DAY ENERGY PO) Take 1 tablet by mouth daily.   Yes [provider]  OVER THE COUNTER MEDICATION Take 1 capsule by mouth 2 (two) times daily. T-MALE.  Testosterone   Yes [provider]  sildenafil (REVATIO) 20 MG tablet TAKE THREE TABLETS BY MOUTH DAILY AS NEEDED 07/28/17  Yes Biagio Borg, MD  Diclofenac Sodium (PENNSAID) 2 % SOLN Place 2 application onto the skin 2 (two) times daily. Patient not taking: Reported on 08/19/2017 11/10/16   Lyndal Pulley, DO    Family History Family History  Problem Relation Age of Onset  . Cancer Mother        Mesothelioma   . Diabetes Brother   . Hypertension Brother   . Stroke Brother 49  . Heart attack Brother 18  . Diabetes Maternal Aunt   . Diabetes Maternal Grandmother   . Heart disease Maternal Grandmother   . Hypertension Maternal Grandmother   . Stroke Maternal Grandmother        in 50s  . Deep vein thrombosis Maternal Grandmother   . Stroke Maternal Grandfather        > 55  . Migraines Brother   . Heart attack Brother 71  . Colon cancer Neg Hx   . Esophageal cancer Neg Hx   . Rectal cancer Neg Hx   . Stomach cancer Neg Hx     Social History Social History   Tobacco Use  . Smoking status: Never Smoker  . Smokeless tobacco: Never Used  Substance Use Topics  . Alcohol use: Yes    Alcohol/week: 0.0 oz    Comment: Stopped drinking 10/2015  . Drug use: No     Allergies   Tramadol   Review of Systems Review of Systems  Constitutional: Negative for chills and fever.  Respiratory: Positive for chest tightness and shortness of breath. Negative for cough.   Cardiovascular: Positive for chest pain. Negative for palpitations and leg swelling.  Gastrointestinal: Negative for abdominal distention, abdominal pain, diarrhea, nausea and vomiting.  Genitourinary: Negative for dysuria, frequency, hematuria and urgency.  Musculoskeletal: Negative for arthralgias, myalgias, neck pain and neck stiffness.  Skin: Negative for rash.  Allergic/Immunologic: Negative for immunocompromised state.  Neurological: Negative for dizziness, weakness, light-headedness, numbness and  headaches.  All other systems reviewed and are negative.    Physical Exam Updated Vital Signs BP 119/84   Pulse 69   Temp 97.8 F (36.6 C) (Oral)   Resp (!) 25   SpO2 95%   Physical Exam  Constitutional: He is oriented to person, place, and time. He appears well-developed and well-nourished. No distress.  HENT:  Head: Normocephalic and atraumatic.  Eyes: Conjunctivae are normal.  Neck: Neck supple.  Cardiovascular: Normal rate, regular rhythm and normal heart sounds.  Pulmonary/Chest: Effort normal. No respiratory distress. He has no wheezes. He has no rales.  Abdominal: Soft. Bowel sounds are normal. He exhibits no distension. There is no tenderness. There is no rebound.  Musculoskeletal: He exhibits no  edema.  Neurological: He is alert and oriented to person, place, and time.  Skin: Skin is warm and dry.  Nursing note and vitals reviewed.    ED Treatments / Results  Labs (all labs ordered are listed, but only abnormal results are displayed) Labs Reviewed  D-DIMER, QUANTITATIVE (NOT AT [facility]) - Abnormal; Notable for the following components:      Result Value   D-Dimer, Quant 0.84 (*)    All other components within normal limits  CBC WITH DIFFERENTIAL/PLATELET  BASIC METABOLIC PANEL  BRAIN NATRIURETIC PEPTIDE  I-STAT TROPONIN, ED  I-STAT TROPONIN, ED    EKG  EKG Interpretation  Date/Time:  Wednesday August 19 2017 09:07:45 EDT Ventricular Rate:  67 PR Interval:    QRS Duration: 95 QT Interval:  372 QTC Calculation: 393 R Axis:   32 Text Interpretation:  Sinus rhythm No significant change was found Confirmed by Jola Schmidt [phone]) on 08/19/2017 12:21:28 PM       Radiology Dg Chest 2 View  Result Date: 08/19/2017 CLINICAL DATA:  Shortness of breath and chest pain EXAM: CHEST - 2 VIEW COMPARISON:  11/06/2016 FINDINGS: Cardiac shadow is stable. The lungs are well aerated bilaterally. No focal infiltrate or sizable effusion is seen. No acute bony abnormality  is noted. IMPRESSION: No active cardiopulmonary disease. Electronically Signed   By: Inez Catalina M.D.   On: 08/19/2017 10:12   Ct Angio Chest Pe W And/or Wo Contrast  Result Date: 08/19/2017 CLINICAL DATA:  Chest pain and shortness of Breath EXAM: CT ANGIOGRAPHY CHEST WITH CONTRAST TECHNIQUE: Multidetector CT imaging of the chest was performed using the standard protocol during bolus administration of intravenous contrast. Multiplanar CT image reconstructions and MIPs were obtained to evaluate the vascular anatomy. CONTRAST:  158mL ISOVUE-370 IOPAMIDOL (ISOVUE-370) INJECTION 76% COMPARISON:  Plain film from earlier in the same day. FINDINGS: Cardiovascular: Thoracic aorta is partially opacified. No aneurysmal dilatation or dissection is noted. The pulmonary artery shows a normal branching pattern. No filling defects to suggest pulmonary emboli are noted. No cardiac enlargement is seen. No coronary calcifications are noted. Mediastinum/Nodes: Thoracic inlet is within normal limits. No hilar or mediastinal adenopathy is noted. The esophagus is within normal limits. Lungs/Pleura: Lungs are well aerated bilaterally with mild dependent atelectatic changes. No focal confluent infiltrate or sizable effusion is seen. Upper Abdomen: Visualized upper abdomen shows no acute abnormality. Musculoskeletal: Mild degenerative changes of the thoracic spine are noted. Review of the MIP images confirms the above findings. IMPRESSION: No evidence of pulmonary emboli. No acute abnormality is noted. Electronically Signed   By: Inez Catalina M.D.   On: 08/19/2017 12:09    Procedures Procedures (including critical care time)  Medications Ordered in ED Medications - No data to display   Initial Impression / Assessment and Plan / ED Course  I have reviewed the triage vital signs and the nursing notes.  Pertinent labs & imaging results that were available during my care of the patient were reviewed by me and considered in my  medical decision making (see chart for details).     Patient with chest pain, shortness of breath, pain going down left arm.  He according to the epic notes, has had extensive workup for same symptoms in the past.  Most recently, his symptoms are thought to be due to panic attacks and anxiety.  Given his history of blood clots, factor V Leyden mutation, and worsening symptoms, will get d-dimer, will get a troponin and basic labs.  Chest  x-ray ordered as well.  Will monitor.   D dimer positive. CT angio chest ordered. Initial trop negative. Labs unremarkable.    CT angio negative. Will monitor and repeat 3hr trop. Pt's heart score is 2. Low risk   Repeat trop is 0. Instructed to follow up with carediology for stress test. At this time, VS normal, pt in NAD, stable for dc home.   Vitals:   08/19/17 1215 08/19/17 1230 08/19/17 1315 08/19/17 1330  BP: 114/83 (!) 130/91 (!) 113/94 134/89  Pulse: (!) 59 66 66 68  Resp: 18 (!) 21 (!) 22 (!) 26  Temp:      TempSrc:      SpO2: 97% 100% 99% 96%     Final Clinical Impressions(s) / ED Diagnoses   Final diagnoses:  Nonspecific chest pain    ED Discharge Orders    None       Janee Morn 08/19/17 1608    Jola Schmidt, MD 08/19/17 1620

## 2017-08-19 NOTE — ED Notes (Signed)
Pt has 324 asa and 2 nitro from ems

## 2017-08-24 ENCOUNTER — Encounter: Payer: Self-pay | Admitting: Internal Medicine

## 2017-08-24 ENCOUNTER — Ambulatory Visit (INDEPENDENT_AMBULATORY_CARE_PROVIDER_SITE_OTHER)
Admission: RE | Admit: 2017-08-24 | Discharge: 2017-08-24 | Disposition: A | Discharge status: Home / Self Care | Payer: BLUE CROSS/BLUE SHIELD | Source: Ambulatory Visit | Attending: Internal Medicine | Admitting: Internal Medicine

## 2017-08-24 ENCOUNTER — Other Ambulatory Visit (INDEPENDENT_AMBULATORY_CARE_PROVIDER_SITE_OTHER): Payer: BLUE CROSS/BLUE SHIELD

## 2017-08-24 ENCOUNTER — Ambulatory Visit (INDEPENDENT_AMBULATORY_CARE_PROVIDER_SITE_OTHER): Payer: BLUE CROSS/BLUE SHIELD | Admitting: Internal Medicine

## 2017-08-24 VITALS — BP 128/80 | HR 87 | Temp 97.6°F | Resp 16 | Ht 67.0 in | Wt 264.8 lb

## 2017-08-24 DIAGNOSIS — R945 Abnormal results of liver function studies: Secondary | ICD-10-CM

## 2017-08-24 DIAGNOSIS — R1013 Epigastric pain: Secondary | ICD-10-CM

## 2017-08-24 DIAGNOSIS — K2100 Gastro-esophageal reflux disease with esophagitis, without bleeding: Secondary | ICD-10-CM

## 2017-08-24 DIAGNOSIS — R7989 Other specified abnormal findings of blood chemistry: Secondary | ICD-10-CM

## 2017-08-24 LAB — GAMMA GT: GGT: 24 U/L (ref 7–51)

## 2017-08-24 LAB — CBC WITH DIFFERENTIAL/PLATELET
Basophils Absolute: 0.1 10*3/uL (ref 0.0–0.1)
Basophils Relative: 0.7 % (ref 0.0–3.0)
EOS ABS: 0.2 10*3/uL (ref 0.0–0.7)
EOS PCT: 2.6 % (ref 0.0–5.0)
HCT: 44.4 % (ref 39.0–52.0)
HEMOGLOBIN: 14.8 g/dL (ref 13.0–17.0)
LYMPHS PCT: 25 % (ref 12.0–46.0)
Lymphs Abs: 2.4 10*3/uL (ref 0.7–4.0)
MCHC: 33.4 g/dL (ref 30.0–36.0)
MCV: 88 fl (ref 78.0–100.0)
MONO ABS: 0.9 10*3/uL (ref 0.1–1.0)
Monocytes Relative: 9.2 % (ref 3.0–12.0)
Neutro Abs: 5.9 10*3/uL (ref 1.4–7.7)
Neutrophils Relative %: 62.5 % (ref 43.0–77.0)
Platelets: 261 10*3/uL (ref 150.0–400.0)
RBC: 5.04 Mil/uL (ref 4.22–5.81)
RDW: 14.1 % (ref 11.5–15.5)
WBC: 9.5 10*3/uL (ref 4.0–10.5)

## 2017-08-24 LAB — HEPATIC FUNCTION PANEL
ALT: 27 U/L (ref 0–53)
AST: 14 U/L (ref 0–37)
Albumin: 3.8 g/dL (ref 3.5–5.2)
Alkaline Phosphatase: 48 U/L (ref 39–117)
BILIRUBIN DIRECT: 0.1 mg/dL (ref 0.0–0.3)
BILIRUBIN TOTAL: 0.4 mg/dL (ref 0.2–1.2)
Total Protein: 7.7 g/dL (ref 6.0–8.3)

## 2017-08-24 LAB — LIPASE: Lipase: 28 U/L (ref 11.0–59.0)

## 2017-08-24 LAB — AMYLASE: AMYLASE: 63 U/L (ref 27–131)

## 2017-08-24 MED ORDER — PANTOPRAZOLE SODIUM 40 MG PO TBEC: 40.0000 mg | DELAYED_RELEASE_TABLET | Freq: Every day | ORAL | 1 refills | Status: DC

## 2017-08-24 NOTE — Progress Notes (Signed)
Subjective:  Patient ID: [name], male    DOB: 08-Apr-1966  Age: 52 y.o. MRN: [id number]  CC: Abdominal Pain and Gastroesophageal Reflux   HPI ARDELL MAKAREWICZ presents for f/up - He was recently seen in the ED for atypical chest pain and had an unremarkable workup.  He tells me the chest pain has resolved but now for 3 days he has had abdominal pain that he says is localized to the area from the epigastrium down to his umbilicus.  He describes it as an intermittently stabbing pain, burning pain, and throbbing pain.  He has had mild heartburn and a  slight decrease in his appetite.  He denies nausea or vomiting but he thought vomiting would make him feel better so he stuck his finger down his throat and what came up was just particle foods.  He denies any hematemesis or coffee-ground emesis.  He occasionally takes Motrin for knee pain.  He rarely drinks alcohol.  He said no recent episodes of odynophagia, dysphagia, or weight loss.  Outpatient Medications Prior to Visit  Medication Sig Dispense Refill  . Diclofenac Sodium (PENNSAID) 2 % SOLN Place 2 application onto the skin 2 (two) times daily. 112 g 3  . sildenafil (REVATIO) 20 MG tablet TAKE THREE TABLETS BY MOUTH DAILY AS NEEDED 50 tablet 5  . ibuprofen (ADVIL,MOTRIN) 200 MG tablet Take 800 mg by mouth every 6 (six) hours as needed for headache.    . ibuprofen (ADVIL,MOTRIN) 800 MG tablet TK 1 T PO  Q 6 H PRN P  0  . Multiple Vitamins-Minerals (ONE-A-DAY ENERGY PO) Take 1 tablet by mouth daily.    [name] OVER THE COUNTER MEDICATION Take 1 capsule by mouth 2 (two) times daily. T-MALE. Testosterone     No facility-administered medications prior to visit.     ROS Review of Systems  Constitutional: Positive for appetite change. Negative for chills, diaphoresis, fatigue and fever.  HENT: Negative.  Negative for sinus pressure, sore throat and trouble swallowing.   Eyes: Negative for visual disturbance.  Respiratory: Negative for  cough, chest tightness, shortness of breath and wheezing.   Cardiovascular: Negative for chest pain, palpitations and leg swelling.  Gastrointestinal: Positive for abdominal pain. Negative for abdominal distention, blood in stool, constipation, diarrhea, nausea and vomiting.  Endocrine: Negative.   Genitourinary: Negative.  Negative for decreased urine volume, difficulty urinating, dysuria, flank pain, frequency, testicular pain and urgency.  Musculoskeletal: Negative.  Negative for arthralgias, back pain, myalgias and neck pain.  Skin: Negative.  Negative for color change and pallor.  Allergic/Immunologic: Negative.   Neurological: Negative.  Negative for dizziness, weakness and light-headedness.  Hematological: Negative for adenopathy. Does not bruise/bleed easily.  Psychiatric/Behavioral: Negative.     Objective:  BP 128/80 (BP Location: Left Arm, Patient Position: Sitting, Cuff Size: Large)   Pulse 87   Temp 97.6 F (36.4 C) (Oral)   Resp 16   Ht 5\' 7"  (1.702 m)   Wt 264 lb 12 oz (120.1 kg)   SpO2 97%   BMI 41.47 kg/m   BP Readings from Last 3 Encounters:  08/24/17 128/80  08/19/17 134/89  06/08/17 (!) 152/98    Wt Readings from Last 3 Encounters:  08/24/17 264 lb 12 oz (120.1 kg)  06/08/17 261 lb (118.4 kg)  03/25/17 267 lb (121.1 kg)    Physical Exam  Constitutional: He is oriented to person, place, and time.  Non-toxic appearance. He does not have a sickly appearance. He does  not appear ill. No distress.  HENT:  Mouth/Throat: Oropharynx is clear and moist. No oropharyngeal exudate.  Eyes: Conjunctivae are normal. Left eye exhibits no discharge. No scleral icterus.  Neck: Normal range of motion. Neck supple. No JVD present. No thyromegaly present.  Cardiovascular: Normal rate, regular rhythm and normal heart sounds. Exam reveals no gallop.  No murmur heard. Pulmonary/Chest: Effort normal and breath sounds normal. No respiratory distress. He has no wheezes. He has no  rales.  Abdominal: Soft. Bowel sounds are normal. He exhibits no distension and no mass. There is no tenderness. There is no guarding.  Genitourinary: Rectum normal and prostate normal. Rectal exam shows no external hemorrhoid, no internal hemorrhoid, no fissure, no mass, no tenderness, anal tone normal and guaiac negative stool. Prostate is not enlarged and not tender.  Musculoskeletal: Normal range of motion. He exhibits no edema, tenderness or deformity.  Lymphadenopathy:    He has no cervical adenopathy.  Neurological: He is alert and oriented to person, place, and time.  Skin: Skin is warm and dry. No rash noted. He is not diaphoretic. No erythema. No pallor.  Vitals reviewed.   Lab Results  Component Value Date   WBC 9.5 08/24/2017   HGB 14.8 08/24/2017   HCT 44.4 08/24/2017   PLT 261.0 08/24/2017   GLUCOSE 98 08/19/2017   CHOL 175 08/19/2017   TRIG 87 08/19/2017   HDL 61 08/19/2017   LDLCALC 97 08/19/2017   ALT 27 08/24/2017   AST 14 08/24/2017   NA 137 08/19/2017   K 4.0 08/19/2017   CL 106 08/19/2017   CREATININE 0.84 08/19/2017   BUN 13 08/19/2017   CO2 24 08/19/2017   TSH 0.94 11/11/2016   PSA 0.37 11/11/2016   INR CANCELED 08/12/2013   HGBA1C 6.1 11/11/2016    Dg Chest 2 View  Result Date: 08/19/2017 CLINICAL DATA:  Shortness of breath and chest pain EXAM: CHEST - 2 VIEW COMPARISON:  11/06/2016 FINDINGS: Cardiac shadow is stable. The lungs are well aerated bilaterally. No focal infiltrate or sizable effusion is seen. No acute bony abnormality is noted. IMPRESSION: No active cardiopulmonary disease. Electronically Signed   By: Inez Catalina M.D.   On: 08/19/2017 10:12   Ct Angio Chest Pe W And/or Wo Contrast  Result Date: 08/19/2017 CLINICAL DATA:  Chest pain and shortness of Breath EXAM: CT ANGIOGRAPHY CHEST WITH CONTRAST TECHNIQUE: Multidetector CT imaging of the chest was performed using the standard protocol during bolus administration of intravenous contrast.  Multiplanar CT image reconstructions and MIPs were obtained to evaluate the vascular anatomy. CONTRAST:  153mL ISOVUE-370 IOPAMIDOL (ISOVUE-370) INJECTION 76% COMPARISON:  Plain film from earlier in the same day. FINDINGS: Cardiovascular: Thoracic aorta is partially opacified. No aneurysmal dilatation or dissection is noted. The pulmonary artery shows a normal branching pattern. No filling defects to suggest pulmonary emboli are noted. No cardiac enlargement is seen. No coronary calcifications are noted. Mediastinum/Nodes: Thoracic inlet is within normal limits. No hilar or mediastinal adenopathy is noted. The esophagus is within normal limits. Lungs/Pleura: Lungs are well aerated bilaterally with mild dependent atelectatic changes. No focal confluent infiltrate or sizable effusion is seen. Upper Abdomen: Visualized upper abdomen shows no acute abnormality. Musculoskeletal: Mild degenerative changes of the thoracic spine are noted. Review of the MIP images confirms the above findings. IMPRESSION: No evidence of pulmonary emboli. No acute abnormality is noted. Electronically Signed   By: Inez Catalina M.D.   On: 08/19/2017 12:09    Assessment & Plan:  Odas was seen today for abdominal pain and gastroesophageal reflux.  Diagnoses and all orders for this visit:  LFT elevation- His LFTs are normal now.  He has an old prior infection with hepatitis B.  Screening for hepatitis C and A are negative.  I have asked him to come in to be vaccinated against hepatitis A. -     Gamma GT; Future -     Hepatic function panel; Future -     Hepatitis A antibody, total; Future -     Hepatitis B core antibody, total; Future -     Hepatitis B surface antibody; Future -     Hepatitis C antibody; Future  Acute epigastric pain- His plain films are remarkable only for a stone in the left kidney which I do not think is migrating or causing any of his symptoms.  His LFTs, pancreatic enzymes, and CBC are all normal.  I am  concerned that he is experiencing dyspepsia or GERD.  I have asked him to start taking a PPI. -     CBC with Differential/Platelet; Future -     Amylase; Future -     Lipase; Future -     DG Abd Acute W/Chest; Future  GERD with esophagitis -     pantoprazole (PROTONIX) 40 MG tablet; Take 1 tablet (40 mg total) by mouth daily.   I have discontinued Daksh Coates. Helderman's ibuprofen, Multiple Vitamins-Minerals (ONE-A-DAY ENERGY PO), OVER THE COUNTER MEDICATION, and ibuprofen. I am also having him start on pantoprazole. Additionally, I am having him maintain his Diclofenac Sodium and sildenafil.  Meds ordered this encounter  Medications  . pantoprazole (PROTONIX) 40 MG tablet    Sig: Take 1 tablet (40 mg total) by mouth daily.    Dispense:  90 tablet    Refill:  1     Follow-up: Return in about 1 week (around 08/31/2017).  Scarlette Calico, MD

## 2017-08-24 NOTE — Patient Instructions (Signed)

## 2017-08-25 ENCOUNTER — Encounter: Payer: Self-pay | Admitting: Internal Medicine

## 2017-08-25 ENCOUNTER — Ambulatory Visit (INDEPENDENT_AMBULATORY_CARE_PROVIDER_SITE_OTHER): Payer: BLUE CROSS/BLUE SHIELD | Admitting: Adult Health

## 2017-08-25 ENCOUNTER — Ambulatory Visit: Payer: BLUE CROSS/BLUE SHIELD | Admitting: Pulmonary Disease

## 2017-08-25 ENCOUNTER — Encounter: Payer: Self-pay | Admitting: Adult Health

## 2017-08-25 DIAGNOSIS — G4733 Obstructive sleep apnea (adult) (pediatric): Secondary | ICD-10-CM

## 2017-08-25 DIAGNOSIS — E668 Other obesity: Secondary | ICD-10-CM | POA: Diagnosis not present

## 2017-08-25 LAB — HEPATITIS A ANTIBODY, TOTAL: HEPATITIS A AB,TOTAL: NONREACTIVE

## 2017-08-25 LAB — HEPATITIS B SURFACE ANTIBODY,QUALITATIVE: Hep B S Ab: REACTIVE — AB

## 2017-08-25 LAB — HEPATITIS B CORE ANTIBODY, TOTAL: HEP B C TOTAL AB: REACTIVE — AB

## 2017-08-25 LAB — HEPATITIS C ANTIBODY
Hepatitis C Ab: NONREACTIVE
SIGNAL TO CUT-OFF: 0.11 (ref <1.00)

## 2017-08-25 NOTE — Assessment & Plan Note (Signed)
Wt loss  

## 2017-08-25 NOTE — Progress Notes (Signed)
@Patient  ID: [name], male    DOB: 01-28-1966, 52 y.o.   MRN: [id number]  Chief Complaint  Patient presents with  . Follow-up    OSA    Referring provider: Biagio Borg, MD  HPI: 52 year old male that is morbidly obese followed for moderate sleep apnea   TEST  HST 09/2013: 24/hr  08/25/2017 Follow up : OSA  She returns for follow-up for sleep apnea.  He was last seen in the office September 2017.  Patient previously been on CPAP for sleep apnea at bedtime.  Patient was having trouble wearing his machine consistently.  Unfortunately his machine was taken back by the homecare company\insurance Patient says that he is becoming more sleepy during the daytime.  Continues to snore and has restless sleep.  Would like to restart on his CPAP.  Epworth score is 21 .  Patient's weight is currently 267lbs     Allergies  Allergen Reactions  . Tramadol Other (See Comments)    insomnia    Immunization History  Administered Date(s) Administered  . Influenza Whole 03/02/2012  . Influenza, High Dose Seasonal PF 06/02/2017  . Influenza-Unspecified 03/10/2013  . Tdap 06/18/2011    Past Medical History:  Diagnosis Date  . Arthritis    knees - no med  . Cluster headache    Hx - resolved per patient  . DVT of lower extremity (deep venous thrombosis) ([place]) 6/26-28/2013   Xarelto - resolved - history  . HTN (hypertension)    currently no meds  . Irritability and anger    PMH of  . Kidney stone    passed stone, no surgery required.  . Rotator cuff arthropathy 2007   right  . Saddle pulmonary embolus ([place]) 6/26-28/2013   post orthopedic surgery - resolved  . Sleep apnea 5/15   moderate OSA-has cpap but does not use it    Tobacco History: Social History   Tobacco Use  Smoking Status Never Smoker  Smokeless Tobacco Never Used   Counseling given: Not Answered   Outpatient Encounter Medications as of 08/25/2017  Medication Sig  . Diclofenac Sodium (PENNSAID) 2 %  SOLN Place 2 application onto the skin 2 (two) times daily.  . pantoprazole (PROTONIX) 40 MG tablet Take 1 tablet (40 mg total) by mouth daily.  . sildenafil (REVATIO) 20 MG tablet TAKE THREE TABLETS BY MOUTH DAILY AS NEEDED   No facility-administered encounter medications on file as of 08/25/2017.      Review of Systems  Constitutional:   No  weight loss, night sweats,  Fevers, chills, fatigue, or  lassitude.  HEENT:   No headaches,  Difficulty swallowing,  Tooth/dental problems, or  Sore throat,                No sneezing, itching, ear ache, nasal congestion, post nasal drip,   CV:  No chest pain,  Orthopnea, PND, swelling in lower extremities, anasarca, dizziness, palpitations, syncope.   GI  No heartburn, indigestion, abdominal pain, nausea, vomiting, diarrhea, change in bowel habits, loss of appetite, bloody stools.   Resp: No shortness of breath with exertion or at rest.  No excess mucus, no productive cough,  No non-productive cough,  No coughing up of blood.  No change in color of mucus.  No wheezing.  No chest wall deformity  Skin: no rash or lesions.  GU: no dysuria, change in color of urine, no urgency or frequency.  No flank pain, no hematuria   MS:  No  joint pain or swelling.  No decreased range of motion.  No back pain.    Physical Exam  BP 132/80 (BP Location: Left Arm, Cuff Size: Normal)   Pulse 65   SpO2 96%   GEN: A/Ox3; pleasant , NAD,  Morbidly obese    HEENT:  [place]/AT,  EACs-clear, TMs-wnl, NOSE-clear, THROAT-clear, no lesions, no postnasal drip or exudate noted. Class 2-3 MP airway   NECK:  Supple w/ fair ROM; no JVD; normal carotid impulses w/o bruits; no thyromegaly or nodules palpated; no lymphadenopathy.    RESP  Clear  P & A; w/o, wheezes/ rales/ or rhonchi. no accessory muscle use, no dullness to percussion  CARD:  RRR, no m/r/g, no peripheral edema, pulses intact, no cyanosis or clubbing.  GI:   Soft & nt; nml bowel sounds; no organomegaly or  masses detected.   Musco: Warm bil, no deformities or joint swelling noted.   Neuro: alert, no focal deficits noted.    Skin: Warm, no lesions or rashes    Lab Results:  CBC   BNP  Imaging:   Assessment & Plan:   No problem-specific Assessment & Plan notes found for this encounter.     Rexene Edison, NP 08/25/2017

## 2017-08-25 NOTE — Addendum Note (Signed)
Addended by: Parke Poisson E on: 08/25/2017 04:01 PM   Modules accepted: Orders

## 2017-08-25 NOTE — Patient Instructions (Signed)
Order for new CPAP machine . May have to have a new sleep study if insurance will not approve  Restart CPAP At bedtime  .  Try to wear each night for at least 4-6 hr  Work on weight loss.  Follow up with Dr. Elsworth Soho  In 3 months and As needed

## 2017-08-25 NOTE — Assessment & Plan Note (Signed)
OSA with comorbidities of morbid obesity and hypertension Patient with significant daytime symptoms.  Will begin CPAP back.  Patient encouraged on compliance and usage.  Plan  . Patient Instructions  Order for new CPAP machine . May have to have a new sleep study if insurance will not approve  Restart CPAP At bedtime  .  Try to wear each night for at least 4-6 hr  Work on weight loss.  Follow up with Dr. Elsworth Soho  In 3 months and As needed

## 2017-08-26 ENCOUNTER — Telehealth: Payer: Self-pay | Admitting: Internal Medicine

## 2017-08-26 ENCOUNTER — Other Ambulatory Visit: Payer: Self-pay | Admitting: Internal Medicine

## 2017-08-26 DIAGNOSIS — N200 Calculus of kidney: Secondary | ICD-10-CM | POA: Insufficient documentation

## 2017-08-26 DIAGNOSIS — R1013 Epigastric pain: Secondary | ICD-10-CM

## 2017-08-26 NOTE — Telephone Encounter (Signed)
Pt contacted and explained lab results and scheduled the appt for his Hep A vaccine.

## 2017-08-26 NOTE — Telephone Encounter (Signed)
Copied from [place]. Topic: Quick Communication - Lab Results >> Aug 26, 2017  9:19 AM Darl Householder, RMA wrote: Patient is requesting lab results, please return pt call

## 2017-08-28 ENCOUNTER — Ambulatory Visit (INDEPENDENT_AMBULATORY_CARE_PROVIDER_SITE_OTHER): Payer: BLUE CROSS/BLUE SHIELD

## 2017-08-28 ENCOUNTER — Encounter: Payer: Self-pay | Admitting: Internal Medicine

## 2017-08-28 ENCOUNTER — Ambulatory Visit (INDEPENDENT_AMBULATORY_CARE_PROVIDER_SITE_OTHER): Payer: BLUE CROSS/BLUE SHIELD | Admitting: Internal Medicine

## 2017-08-28 VITALS — BP 132/86 | HR 70 | Ht 67.0 in | Wt 264.0 lb

## 2017-08-28 DIAGNOSIS — R079 Chest pain, unspecified: Secondary | ICD-10-CM

## 2017-08-28 DIAGNOSIS — Z23 Encounter for immunization: Secondary | ICD-10-CM

## 2017-08-28 DIAGNOSIS — E782 Mixed hyperlipidemia: Secondary | ICD-10-CM

## 2017-08-28 DIAGNOSIS — I10 Essential (primary) hypertension: Secondary | ICD-10-CM | POA: Diagnosis not present

## 2017-08-28 NOTE — Patient Instructions (Signed)
Your physician has requested that you have an exercise tolerance test. For further information please visit [email or web address]. Please also follow instruction sheet, as given.  Your physician recommends that you schedule a follow-up appointment after your stress test

## 2017-08-28 NOTE — Progress Notes (Signed)
OFFICE NOTE  Chief Complaint:  Chest pain  Primary Care Physician: [name], MD  HPI:  [name] is a 52 y.o. male with a past medial history significant for recurrent chest pain on and off for years.  He is a former patient of Dr. Stanford Breed, but is not been seen in many years.  He had also seen Dr. Acie Fredrickson in 2013 for chest pain and incomplete right bundle branch block.  Symptoms were very similar at that time.  He underwent cardiac catheterization in August 2007 by Dr. Haroldine Laws, this indicated normal coronary arteries with distal LAD intramyocardial bridge without obstruction.  There was TIMI II flow in the posterior descending artery which suggested microvascular disease.  Mild LV dysfunction was noted, possibly related to hypertension or history of alcohol.  [name] relates that he recently had gone back to some alcohol and tobacco use but then his quit.  He has some seasonal depression around this time the year which is related to the death of his family members.  He has had a hard time dealing with that.  He was recently seen in the emergency department and cardiac workup including troponin was negative.  A d-dimer was performed and was positive however a CT angiogram showed no evidence of dissection or pulmonary embolus.  No coronary artery calcification was appreciated.  [name] says these episodes are very sporadic, they include a sharp pain that occurs in the left chest and spreads across the chest sometimes to the right arm or left arm.  The episodes last less than 10 minutes and may be associate with exertion but also occur at rest.  He works as a Glass blower/designer.  PMHx:  Past Medical History:  Diagnosis Date  . Arthritis    knees - no med  . Cluster headache    Hx - resolved per patient  . DVT of lower extremity (deep venous thrombosis) ([place]) 6/26-28/2013   Xarelto - resolved - history  . HTN (hypertension)    currently no meds  . Irritability and  anger    PMH of  . Kidney stone    passed stone, no surgery required.  . Rotator cuff arthropathy 2007   right  . Saddle pulmonary embolus ([facility]) 6/26-28/2013   post orthopedic surgery - resolved  . Sleep apnea 5/15   moderate OSA-has cpap but does not use it    Past Surgical History:  Procedure Laterality Date  . arthroscopic knee surgery  10/09/2011   Dr Ninfa Linden  . CARDIAC CATHETERIZATION  2007   LVdysfunction; normal coronaries  . KNEE ARTHROSCOPY WITH MEDIAL MENISECTOMY Left 09/20/2014   medial menisectomy chondroplastytella medial plica excision  ;  Surgeon: Dorna Leitz, MD;  Location: [facility];  Service: Orthopedics;  Laterality: Left;  . ROTATOR CUFF REPAIR  2007   right  . UPPER GASTROINTESTINAL ENDOSCOPY  2007   normal  . WISDOM TOOTH EXTRACTION      FAMHx:  Family History  Problem Relation Age of Onset  . Cancer Mother        Mesothelioma   . Diabetes Brother   . Hypertension Brother   . Stroke Brother 49  . Heart attack Brother 57  . Diabetes Maternal Aunt   . Diabetes Maternal Grandmother   . Heart disease Maternal Grandmother   . Hypertension Maternal Grandmother   . Stroke Maternal Grandmother        in 65s  . Deep vein thrombosis Maternal Grandmother   .  Stroke Maternal Grandfather        > 55  . Migraines Brother   . Heart attack Brother 31  . Colon cancer Neg Hx   . Esophageal cancer Neg Hx   . Rectal cancer Neg Hx   . Stomach cancer Neg Hx     SOCHx:   reports that he has quit smoking. His smoking use included cigars. He has never used smokeless tobacco. He reports that he drinks alcohol. He reports that he does not use drugs.  ALLERGIES:  Allergies  Allergen Reactions  . Tramadol Other (See Comments)    insomnia    ROS: Pertinent items noted in HPI and remainder of comprehensive ROS otherwise negative.  HOME MEDS: Current Outpatient Medications on File Prior to Visit  Medication Sig Dispense Refill  . Multiple  Vitamin (MULTIVITAMIN WITH MINERALS) TABS tablet Take 1 tablet by mouth daily.    . Naproxen Sodium (ALEVE PO) Take 800 mg by mouth 2 (two) times daily as needed.    . pantoprazole (PROTONIX) 40 MG tablet Take 1 tablet (40 mg total) by mouth daily. 90 tablet 1  . sildenafil (REVATIO) 20 MG tablet TAKE THREE TABLETS BY MOUTH DAILY AS NEEDED 50 tablet 5   No current facility-administered medications on file prior to visit.     LABS/IMAGING: No results found for this or any previous visit (from the past 48 hour(s)). No results found.  LIPID PANEL:    Component Value Date/Time   CHOL 175 08/19/2017   CHOL 181 07/31/2014 0954   TRIG 87 08/19/2017   TRIG 67 07/31/2014 0954   HDL 61 08/19/2017   HDL 45 07/31/2014 0954   CHOLHDL 4 11/11/2016 1137   VLDL 14.2 11/11/2016 1137   LDLCALC 97 08/19/2017   LDLCALC 123 (H) 07/31/2014 0954     WEIGHTS: Wt Readings from Last 3 Encounters:  08/28/17 264 lb (119.7 kg)  08/25/17 267 lb 3.2 oz (121.2 kg)  08/24/17 264 lb 12 oz (120.1 kg)    VITALS: BP 132/86 (BP Location: Right Arm)   Pulse 70   Ht 5\' 7"  (1.702 m)   Wt 264 lb (119.7 kg)   BMI 41.35 kg/m   EXAM: General appearance: alert, no distress and morbidly obese Neck: no carotid bruit, no JVD and thyroid not enlarged, symmetric, no tenderness/mass/nodules Lungs: clear to auscultation bilaterally Heart: regular rate and rhythm, S1, S2 normal, no murmur, click, rub or gallop Abdomen: soft, non-tender; bowel sounds normal; no masses,  no organomegaly Extremities: extremities normal, atraumatic, no cyanosis or edema Pulses: 2+ and symmetric Skin: Skin color, texture, turgor normal. No rashes or lesions Neurologic: Grossly normal Psych: Pleasant, but somewhat flat  EKG: Sinus rhythm at 67 (ER EKG from 08/11/2017)- personally reviewed  ASSESSMENT: 1. Atypical chest pain 2. History of normal coronaries with intramyocardial bridge and distal small vessel disease 3. Labile blood  pressures without a diagnosis of hypertension 4. Depressive and anxiety symptoms  PLAN: 1.   [name] has a long history of somewhat atypical chest pain.  He does have a very strong history of heart disease and stroke with multiple family members who have died at a younger age.  That being said recent CT angiography showed no evidence of coronary calcification, and his heart catheterization in the late 2000's showed no coronary disease.  He was found to have an intramyocardial bridge and small vessel disease and I wonder under the right conditions of this could be causing him his symptoms.  He is interested in some weight loss and more activity, therefore we will go ahead and arrange for an exercise treadmill stress test.  If this is low risk he should feel confident proceeding with exercise.  He may benefit from a low-dose beta-blocker or calcium channel blocker for his myocardial bridge to see if this improves his symptoms.  Follow-up with me afterwards.  Pixie Casino, MD, [facility], [place] Director of the Advanced Lipid Disorders &  Cardiovascular Risk Reduction Clinic Diplomate of the American Board of Clinical Lipidology Attending Cardiologist  Direct Dial: [phone]  Fax: [phone]  Website:  [email or web address] 08/28/2017, 11:08 AM

## 2017-09-02 ENCOUNTER — Telehealth (HOSPITAL_COMMUNITY): Payer: Self-pay

## 2017-09-02 NOTE — Telephone Encounter (Signed)
Encounter complete. 

## 2017-09-04 ENCOUNTER — Ambulatory Visit (HOSPITAL_COMMUNITY)
Admission: RE | Admit: 2017-09-04 | Discharge: 2017-09-04 | Disposition: A | Discharge status: Home / Self Care | Payer: BLUE CROSS/BLUE SHIELD | Source: Ambulatory Visit | Attending: Cardiovascular Disease | Admitting: Cardiovascular Disease

## 2017-09-04 DIAGNOSIS — R079 Chest pain, unspecified: Secondary | ICD-10-CM

## 2017-09-04 LAB — EXERCISE TOLERANCE TEST
CSEPED: 9 min
CSEPEDS: 9 s
CSEPEW: 10.4 METS
CSEPHR: 98 %
CSEPPHR: 166 {beats}/min
MPHR: 169 {beats}/min
RPE: 17
Rest HR: 70 {beats}/min

## 2017-09-07 ENCOUNTER — Encounter: Payer: Self-pay | Admitting: Internal Medicine

## 2017-09-07 ENCOUNTER — Ambulatory Visit (INDEPENDENT_AMBULATORY_CARE_PROVIDER_SITE_OTHER)
Admission: RE | Admit: 2017-09-07 | Discharge: 2017-09-07 | Disposition: A | Discharge status: Home / Self Care | Payer: BLUE CROSS/BLUE SHIELD | Source: Ambulatory Visit | Attending: Internal Medicine | Admitting: Internal Medicine

## 2017-09-07 ENCOUNTER — Other Ambulatory Visit: Payer: Self-pay | Admitting: Internal Medicine

## 2017-09-07 DIAGNOSIS — K3189 Other diseases of stomach and duodenum: Secondary | ICD-10-CM | POA: Insufficient documentation

## 2017-09-07 DIAGNOSIS — N200 Calculus of kidney: Secondary | ICD-10-CM | POA: Diagnosis not present

## 2017-09-07 DIAGNOSIS — R1013 Epigastric pain: Secondary | ICD-10-CM | POA: Diagnosis not present

## 2017-09-07 DIAGNOSIS — R109 Unspecified abdominal pain: Secondary | ICD-10-CM | POA: Diagnosis not present

## 2017-09-07 MED ORDER — IOPAMIDOL (ISOVUE-300) INJECTION 61%
100.0000 mL | Freq: Once | INTRAVENOUS | Status: AC | PRN
  Administered 2017-09-07: 100 mL via INTRAVENOUS

## 2017-09-08 NOTE — Telephone Encounter (Signed)
Contacted pt and informed of the CT impression. Informed pt that we are referring him to GI because that are the experts. Pt wanted to know if he needed to worry about it being a cancerous mass, I informed that pt "not at this time". Let GI decide if they need to biopsy and if so, wait for the results. I informed pt that masses can be made of a lot of different cells".   Pt stated that he felt better about the referral and would wait for GI to call with an appt.

## 2017-09-14 ENCOUNTER — Telehealth: Payer: Self-pay | Admitting: Gastroenterology

## 2017-09-14 NOTE — Telephone Encounter (Signed)
Dr Jacobs please advise  

## 2017-09-15 ENCOUNTER — Encounter: Payer: Self-pay | Admitting: Gastroenterology

## 2017-09-15 NOTE — Telephone Encounter (Signed)
Pre Visit and Direct EGD scheduled.

## 2017-09-15 NOTE — Telephone Encounter (Signed)
Called and spoke to pt. He is aware of the reason for the referral and stated understanding.

## 2017-09-15 NOTE — Telephone Encounter (Signed)
Stefannie,  I'm going to call patient to schedule Direct EGD but just wanted to make sure that patient is aware of what he is being referred for before I call to schedule.   Thanks [employer]

## 2017-09-15 NOTE — Telephone Encounter (Signed)
I think it will be best to just set him up as a direct EGD in [place] as long as that is within the next 2-3 weeks.  Can you let him know if that is possible.

## 2017-09-21 DIAGNOSIS — G4733 Obstructive sleep apnea (adult) (pediatric): Secondary | ICD-10-CM | POA: Diagnosis not present

## 2017-09-22 ENCOUNTER — Other Ambulatory Visit: Payer: Self-pay | Admitting: *Deleted

## 2017-09-22 DIAGNOSIS — G4733 Obstructive sleep apnea (adult) (pediatric): Secondary | ICD-10-CM

## 2017-09-25 ENCOUNTER — Other Ambulatory Visit: Payer: Self-pay

## 2017-09-25 ENCOUNTER — Encounter: Payer: Self-pay | Admitting: Gastroenterology

## 2017-09-25 ENCOUNTER — Ambulatory Visit (AMBULATORY_SURGERY_CENTER): Payer: Self-pay

## 2017-09-25 VITALS — Ht 67.0 in | Wt 260.6 lb

## 2017-09-25 DIAGNOSIS — K3189 Other diseases of stomach and duodenum: Secondary | ICD-10-CM

## 2017-09-25 DIAGNOSIS — K319 Disease of stomach and duodenum, unspecified: Secondary | ICD-10-CM

## 2017-09-25 NOTE — Progress Notes (Signed)
Denies allergies to eggs or soy products. Denies complication of anesthesia or sedation. Denies use of weight loss medication. Denies use of O2.   Emmi instructions declined.  

## 2017-09-28 ENCOUNTER — Telehealth: Payer: Self-pay | Admitting: Pulmonary Disease

## 2017-09-28 DIAGNOSIS — G4733 Obstructive sleep apnea (adult) (pediatric): Secondary | ICD-10-CM

## 2017-09-28 NOTE — Telephone Encounter (Signed)
Per RA, HST showed severe OSA with 40 events per hour.   Order for CPAP has been already been placed.

## 2017-10-06 ENCOUNTER — Ambulatory Visit (INDEPENDENT_AMBULATORY_CARE_PROVIDER_SITE_OTHER): Payer: BLUE CROSS/BLUE SHIELD | Admitting: Internal Medicine

## 2017-10-06 ENCOUNTER — Encounter: Payer: Self-pay | Admitting: Internal Medicine

## 2017-10-06 VITALS — BP 152/91 | HR 84 | Ht 67.0 in | Wt 266.0 lb

## 2017-10-06 DIAGNOSIS — R079 Chest pain, unspecified: Secondary | ICD-10-CM | POA: Diagnosis not present

## 2017-10-06 DIAGNOSIS — I10 Essential (primary) hypertension: Secondary | ICD-10-CM

## 2017-10-06 DIAGNOSIS — E782 Mixed hyperlipidemia: Secondary | ICD-10-CM | POA: Diagnosis not present

## 2017-10-06 MED ORDER — CARVEDILOL 3.125 MG PO TABS: 3.1250 mg | ORAL_TABLET | Freq: Two times a day (BID) | ORAL | 3 refills | Status: DC

## 2017-10-06 NOTE — Patient Instructions (Signed)
Your physician has recommended you make the following change in your medication: START carvedilol 3.125mg  twice daily  Your physician recommends that you schedule a follow-up appointment in [place] with Dr. Debara Pickett (ok to double book)

## 2017-10-07 ENCOUNTER — Encounter: Payer: Self-pay | Admitting: Internal Medicine

## 2017-10-07 NOTE — Progress Notes (Signed)
OFFICE NOTE  Chief Complaint:  Follow-up stress test.  Primary Care Physician: [name] Lima, MD  HPI:  [name] is a 52 y.o. male with a past medial history significant for recurrent chest pain on and off for years.  He is a former patient of Dr. Stanford [name], but is not been seen in many years.  He had also seen Dr. Acie [name] in 2013 for chest pain and incomplete right bundle branch block.  Symptoms were very similar at that time.  He underwent cardiac catheterization in August 2007 by Dr. Haroldine [name], this indicated normal coronary arteries with distal LAD intramyocardial bridge without obstruction.  There was TIMI II flow in the posterior descending artery which suggested microvascular disease.  Mild LV dysfunction was noted, possibly related to hypertension or history of alcohol.  [name] relates that he recently had gone back to some alcohol and tobacco use but then his quit.  He has some seasonal depression around this time the year which is related to the death of his family members.  He has had a hard time dealing with that.  He was recently seen in the emergency department and cardiac workup including troponin was negative.  A d-dimer was performed and was positive however a CT angiogram showed no evidence of dissection or pulmonary embolus.  No coronary artery calcification was appreciated.  [name] says these episodes are very sporadic, they include a sharp pain that occurs in the left chest and spreads across the chest sometimes to the right arm or left arm.  The episodes last less than 10 minutes and may be associate with exertion but also occur at rest.  He works as a Glass blower/designer.  10/06/2017  [name] returns today for follow-up.  He underwent a treadmill exercise stress test.  He actually did fairly well despite a knee problem.  He said he did get some discomfort in his chest although his EKG was negative for ischemia.  He is also scheduled to have an EGD with  [name] coming up in a few days.  He says that he had a pre-existing "mass" in his stomach which is being evaluated.  Some of the symptoms could be related to that.  In addition blood pressure is elevated and he may simply benefit from blood pressure medication.  We discussed use of a beta-blocker today.  I think this would be helpful for him even at a low dose.  PMHx:  Past Medical History:  Diagnosis Date  . Anxiety   . Arthritis    knees - no med  . Cluster headache    Hx - resolved per patient  . Depression   . DVT of lower extremity (deep venous thrombosis) ([place]) 6/26-28/2013   Xarelto - resolved - history  . HTN (hypertension)    currently no meds  . Irritability and anger    PMH of  . Kidney stone    passed stone, no surgery required.  . Rotator cuff arthropathy 2007   right  . Saddle pulmonary embolus ([place]) 6/26-28/2013   post orthopedic surgery - resolved  . Sleep apnea 5/15   moderate OSA-has cpap but does not use it    Past Surgical History:  Procedure Laterality Date  . arthroscopic knee surgery  10/09/2011   Dr Ninfa Linden  . CARDIAC CATHETERIZATION  2007   LVdysfunction; normal coronaries  . KNEE ARTHROSCOPY WITH MEDIAL MENISECTOMY Left 09/20/2014   medial menisectomy chondroplastytella medial plica excision  ;  Surgeon:  [name] Leitz, MD;  Location: [facility];  Service: Orthopedics;  Laterality: Left;  . ROTATOR CUFF REPAIR  2007   right  . UPPER GASTROINTESTINAL ENDOSCOPY  2007   normal  . WISDOM TOOTH EXTRACTION      FAMHx:  Family History  Problem Relation Age of Onset  . Cancer Mother        Mesothelioma   . Diabetes Brother   . Hypertension Brother   . Stroke Brother 45  . Heart attack Brother 11  . Diabetes Maternal Aunt   . Diabetes Maternal Grandmother   . Heart disease Maternal Grandmother   . Hypertension Maternal Grandmother   . Stroke Maternal Grandmother        in 31s  . Deep vein thrombosis Maternal Grandmother   .  Stroke Maternal Grandfather        > 55  . Migraines Brother   . Heart attack Brother 83  . Colon cancer Neg Hx   . Esophageal cancer Neg Hx   . Rectal cancer Neg Hx   . Stomach cancer Neg Hx   . Liver cancer Neg Hx   . Pancreatic cancer Neg Hx     SOCHx:   reports that he has quit smoking. His smoking use included cigars. He has never used smokeless tobacco. He reports that he drinks alcohol. He reports that he does not use drugs.  ALLERGIES:  Allergies  Allergen Reactions  . Tramadol Other (See Comments)    insomnia    ROS: Pertinent items noted in HPI and remainder of comprehensive ROS otherwise negative.  HOME MEDS: Current Outpatient Medications on File Prior to Visit  Medication Sig Dispense Refill  . Multiple Vitamin (MULTIVITAMIN WITH MINERALS) TABS tablet Take 1 tablet by mouth daily.    . Naproxen Sodium (ALEVE PO) Take 800 mg by mouth 2 (two) times daily as needed.    . pantoprazole (PROTONIX) 40 MG tablet Take 1 tablet (40 mg total) by mouth daily. 90 tablet 1  . sildenafil (REVATIO) 20 MG tablet TAKE THREE TABLETS BY MOUTH DAILY AS NEEDED 50 tablet 5   No current facility-administered medications on file prior to visit.     LABS/IMAGING: No results found for this or any previous visit (from the past 48 hour(s)). No results found.  LIPID PANEL:    Component Value Date/Time   CHOL 175 08/19/2017   CHOL 181 07/31/2014 0954   TRIG 87 08/19/2017   TRIG 67 07/31/2014 0954   HDL 61 08/19/2017   HDL 45 07/31/2014 0954   CHOLHDL 4 11/11/2016 1137   VLDL 14.2 11/11/2016 1137   LDLCALC 97 08/19/2017   LDLCALC 123 (H) 07/31/2014 0954     WEIGHTS: Wt Readings from Last 3 Encounters:  10/06/17 266 lb (120.7 kg)  09/25/17 260 lb 9.6 oz (118.2 kg)  08/28/17 264 lb (119.7 kg)    VITALS: BP (!) 152/91   Pulse 84   Ht 5\' 7"  (1.702 m)   Wt 266 lb (120.7 kg)   BMI 41.66 kg/m   EXAM: Deferred  EKG: Deferred  ASSESSMENT: 1. Atypical chest pain -low  risk exercise stress test 2. History of normal coronaries with intramyocardial bridge and distal small vessel disease 3. Labile blood pressures without a diagnosis of hypertension 4. Depressive and anxiety symptoms  PLAN: 1.   Mr. Diskin continues to have some atypical chest discomfort and had a low risk exercise stress test.  Blood pressure remains elevated.  I recommend starting  low-dose carvedilol for better blood pressure control and antianginal therapy.  He is agreeable to this today and will continue to monitor blood pressures.  He has an upcoming EGD with [name] which hopefully may shed some more light on his symptoms.  Follow-up with me in 1 month.  Pixie Casino, MD, [facility], [place] Director of the Advanced Lipid Disorders &  Cardiovascular Risk Reduction Clinic Diplomate of the American Board of Clinical Lipidology Attending Cardiologist  Direct Dial: [phone]  Fax: [phone]  Website:  [email or web address] 10/07/2017, 5:21 PM

## 2017-10-09 ENCOUNTER — Other Ambulatory Visit: Payer: Self-pay

## 2017-10-09 ENCOUNTER — Ambulatory Visit (AMBULATORY_SURGERY_CENTER): Payer: BLUE CROSS/BLUE SHIELD | Admitting: Gastroenterology

## 2017-10-09 ENCOUNTER — Encounter: Payer: Self-pay | Admitting: Gastroenterology

## 2017-10-09 VITALS — BP 117/70 | HR 71 | Temp 98.7°F | Resp 19 | Ht 67.0 in | Wt 260.0 lb

## 2017-10-09 DIAGNOSIS — K319 Disease of stomach and duodenum, unspecified: Secondary | ICD-10-CM | POA: Diagnosis not present

## 2017-10-09 DIAGNOSIS — K259 Gastric ulcer, unspecified as acute or chronic, without hemorrhage or perforation: Secondary | ICD-10-CM | POA: Diagnosis not present

## 2017-10-09 DIAGNOSIS — B9681 Helicobacter pylori [H. pylori] as the cause of diseases classified elsewhere: Secondary | ICD-10-CM | POA: Diagnosis not present

## 2017-10-09 DIAGNOSIS — K3189 Other diseases of stomach and duodenum: Secondary | ICD-10-CM

## 2017-10-09 DIAGNOSIS — R1013 Epigastric pain: Secondary | ICD-10-CM | POA: Diagnosis not present

## 2017-10-09 DIAGNOSIS — K2950 Unspecified chronic gastritis without bleeding: Secondary | ICD-10-CM | POA: Diagnosis not present

## 2017-10-09 MED ORDER — SODIUM CHLORIDE 0.9 % IV SOLN
4.0000 mg | Freq: Once | INTRAVENOUS | Status: AC
  Administered 2017-10-09: 4 mg via INTRAVENOUS

## 2017-10-09 MED ORDER — IPRATROPIUM-ALBUTEROL 0.5-2.5 (3) MG/3ML IN SOLN
3.0000 mL | Freq: Once | RESPIRATORY_TRACT | Status: AC
  Administered 2017-10-09: 3 mL via RESPIRATORY_TRACT

## 2017-10-09 MED ORDER — SODIUM CHLORIDE 0.9 % IV SOLN: 500.0000 mL | Freq: Once | INTRAVENOUS | Status: DC

## 2017-10-09 NOTE — Progress Notes (Signed)
Report given to PACU, vss 

## 2017-10-09 NOTE — Progress Notes (Signed)
Called to room to assist during endoscopic procedure.  Patient ID and intended procedure confirmed with present staff. Received instructions for my participation in the procedure from the performing physician.  

## 2017-10-09 NOTE — Progress Notes (Addendum)
Patient coughing, complains of "hard to breath" pain in belly and chest. Lafe Garin CRNA and Dr Ardis Hughs in to see the patient. Sa02 93-94 %. Sinus tach, mucous membranes pink. Albuteral 2.5 mg in 2 ml NSNebulizer ordered. Given at 1417 1423 12 Lead EKG 1423 Zofran 4 mg IV dilated in 10 ml  NS- Halina Andreas. Lungs clear to auscultation per C. Wall CRNA 1430sinus rhythm, sa02 96%. Denies nausea at this time. 1431resting, no shortness of breath or complaints of dyspnea.

## 2017-10-09 NOTE — Progress Notes (Signed)
1455 patient in Fowlers position, resting comfortably, no dyspnea. when questioned, patient stating he feels better.

## 2017-10-09 NOTE — Op Note (Addendum)
[employer] Patient Name: [name] Procedure Date: 10/09/2017 1:36 PM MRN: [id number] Endoscopist: Milus Banister , MD Age: 52 Referring MD:  Date of Birth: [birth year] Gender: Male Account #: [id number] Procedure:                Upper GI endoscopy Indications:              Periumbilical abdominal pain, abnormal appearing                            stomach on recent CT scan Medicines:                Monitored Anesthesia Care Procedure:                Pre-Anesthesia Assessment:                           - Prior to the procedure, a History and Physical                            was performed, and patient medications and                            allergies were reviewed. The patient's tolerance of                            previous anesthesia was also reviewed. The risks                            and benefits of the procedure and the sedation                            options and risks were discussed with the patient.                            All questions were answered, and informed consent                            was obtained. Prior Anticoagulants: The patient has                            taken no previous anticoagulant or antiplatelet                            agents. ASA Grade Assessment: II - A patient with                            mild systemic disease. After reviewing the risks                            and benefits, the patient was deemed in                            satisfactory condition to undergo the procedure.  After obtaining informed consent, the endoscope was                            passed under direct vision. Throughout the                            procedure, the patient's blood pressure, pulse, and                            oxygen saturations were monitored continuously. The                            Endoscope was introduced through the mouth, and                            advanced to the second part of  duodenum. The upper                            GI endoscopy was accomplished without difficulty.                            The patient tolerated the procedure well. Scope In: Scope Out: Findings:                 The esophagus was normal.                           There were several small, shallow, clean based                            ulcers in the distal stomach with surrounding                            moderate gastritis. No overtly neoplastic mucosa.                            Biopsies were taken from around the ulcers.                           Mild non-specific duodenitis.                           The exam was otherwise without abnormality. Complications:            No immediate complications. Estimated blood loss:                            None. Estimated Blood Loss:     Estimated blood loss: none. Impression:               - Several small ulcers in the stomach, biopsied to                            check for H. pylori, neoplams.                           -  Non-specific duodenitis. Recommendation:           - Patient has a contact number available for                            emergencies. The signs and symptoms of potential                            delayed complications were discussed with the                            patient. Return to normal activities tomorrow.                            Written discharge instructions were provided to the                            patient.                           - Resume previous diet.                           - Continue present medications. Please start taking                            your protonix (previously prescribed). Stop taking                            the ibuprofen and instead take tylenol for your                            pains.                           - Await pathology results.                           - My office will arrange pulmonary referral for                            atypical chest pains,  shortness of breath. Milus Banister, MD 10/09/2017 1:59:54 PM This report has been signed electronically.

## 2017-10-09 NOTE — Progress Notes (Signed)
Dr. Ardis Hughs in to see patient again.patient continues to rest comfortably on room air.
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
## 2017-10-09 NOTE — Patient Instructions (Addendum)
Continue taking your medication.  Start your Protonix . Please avoid NSAID type of medication.( Ibuprofen, Aleve, Motrin etc.)   YOU HAD AN ENDOSCOPIC PROCEDURE TODAY AT [place]:   Refer to the procedure report that was given to you for any specific questions about what was found during the examination.  If the procedure report does not answer your questions, please call your gastroenterologist to clarify.  If you requested that your care partner not be given the details of your procedure findings, then the procedure report has been included in a sealed envelope for you to review at your convenience later.  YOU SHOULD EXPECT: Some feelings of bloating in the abdomen. Passage of more gas than usual.  Walking can help get rid of the air that was put into your GI tract during the procedure and reduce the bloating. If you had a lower endoscopy (such as a colonoscopy or flexible sigmoidoscopy) you may notice spotting of blood in your stool or on the toilet paper. If you underwent a bowel prep for your procedure, you may not have a normal bowel movement for a few days.  Please Note:  You might notice some irritation and congestion in your nose or some drainage.  This is from the oxygen used during your procedure.  There is no need for concern and it should clear up in a day or so.  SYMPTOMS TO REPORT IMMEDIATELY:    Following upper endoscopy (EGD)  Vomiting of blood or coffee ground material  New chest pain or pain under the shoulder blades  Painful or persistently difficult swallowing  New shortness of breath  Fever of 100F or higher  Black, tarry-looking stools  For urgent or emergent issues, a gastroenterologist can be reached at any hour by calling [phone].   DIET:  We do recommend a small meal at first, but then you may proceed to your regular diet.  Drink plenty of fluids but you should avoid alcoholic beverages for 24 hours.  ACTIVITY:  You should plan to take  it easy for the rest of today and you should NOT DRIVE or use heavy machinery until tomorrow (because of the sedation medicines used during the test).    FOLLOW UP: Our staff will call the number listed on your records the next business day following your procedure to check on you and address any questions or concerns that you may have regarding the information given to you following your procedure. If we do not reach you, we will leave a message.  However, if you are feeling well and you are not experiencing any problems, there is no need to return our call.  We will assume that you have returned to your regular daily activities without incident.  If any biopsies were taken you will be contacted by phone or by letter within the next 1-3 weeks.  Please call us at [phone] if you have not heard about the biopsies in 3 weeks.    SIGNATURES/CONFIDENTIALITY: You and/or your care partner have signed paperwork which will be entered into your electronic medical record.  These signatures attest to the fact that that the information above on your After Visit Summary has been reviewed and is understood.  Full responsibility of the confidentiality of this discharge information lies with you and/or your care-partner.

## 2017-10-09 NOTE — Progress Notes (Signed)
He had some chest and abdomen discomfort with spasms and breathing difficulty following his EGD.  EKG was essentially normal.  Sats normal. BP normal.  His breathing seemed to improved with neb treatment. Was given zofran for some nausea.  In past two months PE protocol CT was neg.  Cardiac stress test without ischemia.  Currently he is resting in recovery.

## 2017-10-12 ENCOUNTER — Telehealth: Payer: Self-pay | Admitting: *Deleted

## 2017-10-12 NOTE — Telephone Encounter (Signed)
No answer for post procedure call back. Left message and will attempt to call back this afternoon. SM

## 2017-10-12 NOTE — Telephone Encounter (Signed)
No answer for second post procedure call back. Left message for patient to call with questions or concerns. SM 

## 2017-10-13 ENCOUNTER — Other Ambulatory Visit: Payer: Self-pay | Admitting: Adult Health

## 2017-10-13 ENCOUNTER — Telehealth: Payer: Self-pay | Admitting: Pulmonary Disease

## 2017-10-13 DIAGNOSIS — G4733 Obstructive sleep apnea (adult) (pediatric): Secondary | ICD-10-CM

## 2017-10-13 NOTE — Addendum Note (Signed)
Addended by: Jannette Spanner on: 10/13/2017 11:47 AM   Modules accepted: Orders

## 2017-10-13 NOTE — Telephone Encounter (Signed)
cpap order placed. No follow up currently scheduled, recall for late 10/2017 in chart. lmtcb X1 for pt to schedule rov.

## 2017-10-13 NOTE — Telephone Encounter (Signed)
CPAP 5-15 cm, mask of choice Pl ensure FU in 6 wks

## 2017-10-13 NOTE — Telephone Encounter (Signed)
PCC's can we make sure this order was sent?

## 2017-10-13 NOTE — Telephone Encounter (Signed)
Dr. Elsworth Soho can you specify settings before I place the order. He had a new sleep study.

## 2017-10-13 NOTE — Telephone Encounter (Signed)
Routing back to [place] as this message was not triage-generated and has already been closed.

## 2017-10-13 NOTE — Telephone Encounter (Signed)
Left message for patient to call back.       2:25 PM  Note    Per RA, HST showed severe OSA with 40 events per hour.   Order for CPAP has been already been placed.

## 2017-10-13 NOTE — Telephone Encounter (Signed)
I do not see and order for this patient

## 2017-10-13 NOTE — Addendum Note (Signed)
Addended by: Len Blalock on: 10/13/2017 03:25 PM   Modules accepted: Orders

## 2017-10-14 NOTE — Telephone Encounter (Signed)
Pt is calling back [phone]

## 2017-10-14 NOTE — Telephone Encounter (Signed)
Spoke with pt. He is aware of his sleep study results. I have verified that the CPAP order was in fact placed. The order was sent to [facility] on 10/16/17. Nothing further was needed.

## 2017-10-14 NOTE — Telephone Encounter (Signed)
lmtcb X2 for pt to relay sleep study results.

## 2017-10-14 NOTE — Telephone Encounter (Signed)
Pt want's a detaiiled message left on hs VM

## 2017-10-15 ENCOUNTER — Telehealth: Payer: Self-pay | Admitting: Gastroenterology

## 2017-10-15 NOTE — Telephone Encounter (Signed)
Santiago Glad, no result note is needed.   See path results note.  thanks

## 2017-10-22 ENCOUNTER — Telehealth: Payer: Self-pay | Admitting: Pulmonary Disease

## 2017-10-22 DIAGNOSIS — G4733 Obstructive sleep apnea (adult) (pediatric): Secondary | ICD-10-CM

## 2017-10-22 NOTE — Telephone Encounter (Signed)
Called and spoke with Patient.  Patient does not want to use [facility] as DME.  I explained that I would have to place a new order.  He said that he would prefer that.  He has had a bad experience in the past with [facility].  New DME order placed for CPAP.  Nothing further needed at this time.

## 2017-11-12 ENCOUNTER — Ambulatory Visit: Payer: BLUE CROSS/BLUE SHIELD | Admitting: Internal Medicine

## 2017-11-12 ENCOUNTER — Encounter: Payer: BLUE CROSS/BLUE SHIELD | Admitting: Internal Medicine

## 2017-11-13 DIAGNOSIS — G4733 Obstructive sleep apnea (adult) (pediatric): Secondary | ICD-10-CM | POA: Diagnosis not present

## 2017-11-16 ENCOUNTER — Telehealth: Payer: Self-pay | Admitting: Pulmonary Disease

## 2017-11-16 NOTE — Telephone Encounter (Signed)
Received a fax from [facility] stating that the patient was setup for his CPAP machine on 6/14. Will need a f/u 12/14/17-02/11/18.   Left message for patient to call back to get him scheduled.

## 2017-11-17 ENCOUNTER — Ambulatory Visit: Payer: BLUE CROSS/BLUE SHIELD | Admitting: Internal Medicine

## 2017-11-17 DIAGNOSIS — R0989 Other specified symptoms and signs involving the circulatory and respiratory systems: Secondary | ICD-10-CM

## 2017-11-18 ENCOUNTER — Encounter: Payer: Self-pay | Admitting: *Deleted

## 2017-12-07 ENCOUNTER — Ambulatory Visit: Payer: BLUE CROSS/BLUE SHIELD | Admitting: Internal Medicine

## 2017-12-10 ENCOUNTER — Telehealth: Payer: Self-pay

## 2017-12-10 NOTE — Telephone Encounter (Signed)
Left message for patient to call back for an appointment with Dr. Tamala Julian.

## 2017-12-10 NOTE — Telephone Encounter (Signed)
Left message for patient regarding visco supplementation injections. Since patient has not received injections for a while we will need to run his insurance again to make sure that the injection is covered before proceeding with injection. Will contact insurance and get back with patient next week.

## 2017-12-10 NOTE — Telephone Encounter (Signed)
Copied from [place] [id number]. Topic: Inquiry >> Dec 09, 2017  4:30 PM Oliver Pila B wrote: Reason for CRM: pt called to speak Dr. Charlann Boxer or his nurse about getting a shot; pt did not specify but wanted a call back asap

## 2017-12-13 DIAGNOSIS — G4733 Obstructive sleep apnea (adult) (pediatric): Secondary | ICD-10-CM | POA: Diagnosis not present

## 2017-12-16 NOTE — Progress Notes (Signed)
[name] [address], [place] [address] Phone: [phone] Subjective:     CC: Bilateral knee pain  CBS:[id number]  [name] is a 52 y.o. male coming in with complaint of knee pain. Left knee is worse than the right. Pain is felt throughout entire joint. Has had visco supplementation that provided him with some relief. Is here today for cortisone injections to be followed by subsequent visco supplementation. Would like to return to walking in order to get some weight off for improved knee pain.        Past Medical History:  Diagnosis Date  . Anxiety   . Arthritis    knees - no med  . Cluster headache    Hx - resolved per patient  . Depression   . DVT of lower extremity (deep venous thrombosis) ([place]) 6/26-28/2013   Xarelto - resolved - history  . HTN (hypertension)    currently no meds  . Irritability and anger    PMH of  . Kidney stone    passed stone, no surgery required.  . Rotator cuff arthropathy 2007   right  . Saddle pulmonary embolus ([place]) 6/26-28/2013   post orthopedic surgery - resolved  . Sleep apnea 5/15   moderate OSA-has cpap but does not use it   Past Surgical History:  Procedure Laterality Date  . arthroscopic knee surgery  10/09/2011   Dr Ninfa Linden  . CARDIAC CATHETERIZATION  2007   LVdysfunction; normal coronaries  . KNEE ARTHROSCOPY WITH MEDIAL MENISECTOMY Left 09/20/2014   medial menisectomy chondroplastytella medial plica excision  ;  Surgeon: Dorna Leitz, MD;  Location: [facility];  Service: Orthopedics;  Laterality: Left;  . ROTATOR CUFF REPAIR  2007   right  . UPPER GASTROINTESTINAL ENDOSCOPY  2007   normal  . WISDOM TOOTH EXTRACTION     Social History   Socioeconomic History  . Marital status: Single    Spouse name: Not on file  . Number of children: 6  . Years of education: 33  . Highest education level: Not on file  Occupational History  . Occupation: Glass blower/designer    . Occupation: Programmer, systems  Social Needs  . Financial resource strain: Not on file  . Food insecurity:    Worry: Not on file    Inability: Not on file  . Transportation needs:    Medical: Not on file    Non-medical: Not on file  Tobacco Use  . Smoking status: Former Smoker    Types: Cigars  . Smokeless tobacco: Never Used  . Tobacco comment: every 3-4 months  Substance and Sexual Activity  . Alcohol use: Yes    Alcohol/week: 0.0 oz    Comment: drinks occasionally  . Drug use: No  . Sexual activity: Not on file  Lifestyle  . Physical activity:    Days per week: Not on file    Minutes per session: Not on file  . Stress: Not on file  Relationships  . Social connections:    Talks on phone: Not on file    Gets together: Not on file    Attends religious service: Not on file    Active member of club or organization: Not on file    Attends meetings of clubs or organizations: Not on file    Relationship status: Not on file  Other Topics Concern  . Not on file  Social History Narrative   Fun: Exercise   Allergies  Allergen Reactions  . Tramadol Other (See Comments)    insomnia   Family History  Problem Relation Age of Onset  . Cancer Mother        Mesothelioma   . Diabetes Brother   . Hypertension Brother   . Stroke Brother 35  . Heart attack Brother 47  . Diabetes Maternal Aunt   . Diabetes Maternal Grandmother   . Heart disease Maternal Grandmother   . Hypertension Maternal Grandmother   . Stroke Maternal Grandmother        in 13s  . Deep vein thrombosis Maternal Grandmother   . Stroke Maternal Grandfather        > 55  . Migraines Brother   . Heart attack Brother 31  . Colon cancer Neg Hx   . Esophageal cancer Neg Hx   . Rectal cancer Neg Hx   . Stomach cancer Neg Hx   . Liver cancer Neg Hx   . Pancreatic cancer Neg Hx      Past medical history, social, surgical and family history all reviewed in electronic medical record.  No pertanent  information unless stated regarding to the chief complaint.   Review of Systems:Review of systems updated and as accurate as of 12/17/17  No headache, visual changes, nausea, vomiting, diarrhea, constipation, dizziness, abdominal pain, skin rash, fevers, chills, night sweats, weight loss, swollen lymph nodes, body aches, joint swelling,  chest pain, shortness of breath, mood changes.   Objective  Blood pressure 110/80, pulse 75, height 5\' 7"  (1.702 m), weight 269 lb (122 kg), SpO2 98 %. Systems examined below as of 12/17/17   General: No apparent distress alert and oriented x3 mood and affect normal, dressed appropriately.  HEENT: Pupils equal, extraocular movements intact  Respiratory: Patient's speak in full sentences and does not appear short of breath  Cardiovascular: No lower extremity edema, non tender, no erythema  Skin: Warm dry intact with no signs of infection or rash on extremities or on axial skeleton.  Abdomen: Soft nontender  Neuro: Cranial nerves II through XII are intact, neurovascularly intact in all extremities with 2+ DTRs and 2+ pulses.  Lymph: No lymphadenopathy of posterior or anterior cervical chain or axillae bilaterally.  Gait mild abnormality of gait MSK:  Non tender with full range of motion and good stability and symmetric strength and tone of shoulders, elbows, wrist, hip, and ankles bilaterally.  Knee: Bilateral valgus deformity noted. Large thigh to calf ratio.  Tender to palpation over medial and PF joint line.  ROM full in flexion and extension and lower leg rotation. instability with valgus force.  painful patellar compression. Patellar glide with moderate crepitus. Patellar and quadriceps tendons unremarkable. Hamstring and quadriceps strength is normal.   After informed written and verbal consent, patient was seated on exam table. Right knee was prepped with alcohol swab and utilizing anterolateral approach, patient's right knee space was injected  with 4:1  marcaine 0.5%: Kenalog 40mg /dL. Patient tolerated the procedure well without immediate complications.  After informed written and verbal consent, patient was seated on exam table. Left knee was prepped with alcohol swab and utilizing anterolateral approach, patient's left knee space was injected with 4:1  marcaine 0.5%: Kenalog 40mg /dL. Patient tolerated the procedure well without immediate complications.   Impression and Recommendations:     This case required medical decision making of moderate complexity.      Note: This dictation was prepared with Dragon dictation along with smaller phrase technology. Any transcriptional errors that result from  this process are unintentional.

## 2017-12-17 ENCOUNTER — Encounter: Payer: Self-pay | Admitting: Family Medicine

## 2017-12-17 ENCOUNTER — Ambulatory Visit (INDEPENDENT_AMBULATORY_CARE_PROVIDER_SITE_OTHER): Payer: BLUE CROSS/BLUE SHIELD | Admitting: Family Medicine

## 2017-12-17 DIAGNOSIS — M170 Bilateral primary osteoarthritis of knee: Secondary | ICD-10-CM

## 2017-12-17 NOTE — Assessment & Plan Note (Signed)
Bilateral injections given.  Discussed icing regimen and home exercise.  Discussed which activities to do which wants to avoid.  Increase activity slowly.  Patient has been showing some improvement with Visco supplementation previously.  Like to consider this again prior authorization.  Follow-up again in 2 to 3 weeks

## 2017-12-17 NOTE — Patient Instructions (Addendum)
Good to see you  Ice 20 minutes 2 times daily. Usually after activity and before bed. Dhea 50 mg daily for 4 weeks Bromelain 2400IU with each meal can help with weight loss See you soon

## 2017-12-22 ENCOUNTER — Encounter: Payer: BLUE CROSS/BLUE SHIELD | Admitting: Internal Medicine

## 2017-12-22 DIAGNOSIS — Z0289 Encounter for other administrative examinations: Secondary | ICD-10-CM

## 2017-12-23 NOTE — Progress Notes (Deleted)
[name] [address] [place], [place] [address] Phone: [phone] Subjective:    I'm seeing this patient by the request  of:    CC:   [id number]  [name] is a 52 y.o. male coming in with complaint of ***  Onset-  Location Duration-  Character- Aggravating factors- Reliving factors-  Therapies tried-  Severity-     Past Medical History:  Diagnosis Date  . Anxiety   . Arthritis    knees - no med  . Cluster headache    Hx - resolved per patient  . Depression   . DVT of lower extremity (deep venous thrombosis) ([place]) 6/26-28/2013   Xarelto - resolved - history  . HTN (hypertension)    currently no meds  . Irritability and anger    PMH of  . Kidney stone    passed stone, no surgery required.  . Rotator cuff arthropathy 2007   right  . Saddle pulmonary embolus ([place]) 6/26-28/2013   post orthopedic surgery - resolved  . Sleep apnea 5/15   moderate OSA-has cpap but does not use it   Past Surgical History:  Procedure Laterality Date  . arthroscopic knee surgery  10/09/2011   Dr Ninfa Linden  . CARDIAC CATHETERIZATION  2007   LVdysfunction; normal coronaries  . KNEE ARTHROSCOPY WITH MEDIAL MENISECTOMY Left 09/20/2014   medial menisectomy chondroplastytella medial plica excision  ;  Surgeon: Dorna Leitz, MD;  Location: [facility];  Service: Orthopedics;  Laterality: Left;  . ROTATOR CUFF REPAIR  2007   right  . UPPER GASTROINTESTINAL ENDOSCOPY  2007   normal  . WISDOM TOOTH EXTRACTION     Social History   Socioeconomic History  . Marital status: Single    Spouse name: Not on file  . Number of children: 6  . Years of education: 58  . Highest education level: Not on file  Occupational History  . Occupation: Glass blower/designer   . Occupation: Programmer, systems  Social Needs  . Financial resource strain: Not on file  . Food insecurity:    Worry: Not on file    Inability: Not on file  . Transportation  needs:    Medical: Not on file    Non-medical: Not on file  Tobacco Use  . Smoking status: Former Smoker    Types: Cigars  . Smokeless tobacco: Never Used  . Tobacco comment: every 3-4 months  Substance and Sexual Activity  . Alcohol use: Yes    Alcohol/week: 0.0 oz    Comment: drinks occasionally  . Drug use: No  . Sexual activity: Not on file  Lifestyle  . Physical activity:    Days per week: Not on file    Minutes per session: Not on file  . Stress: Not on file  Relationships  . Social connections:    Talks on phone: Not on file    Gets together: Not on file    Attends religious service: Not on file    Active member of club or organization: Not on file    Attends meetings of clubs or organizations: Not on file    Relationship status: Not on file  Other Topics Concern  . Not on file  Social History Narrative   Fun: Exercise   Allergies  Allergen Reactions  . Tramadol Other (See Comments)    insomnia   Family History  Problem Relation Age of Onset  . Cancer Mother  Mesothelioma   . Diabetes Brother   . Hypertension Brother   . Stroke Brother 24  . Heart attack Brother 36  . Diabetes Maternal Aunt   . Diabetes Maternal Grandmother   . Heart disease Maternal Grandmother   . Hypertension Maternal Grandmother   . Stroke Maternal Grandmother        in 33s  . Deep vein thrombosis Maternal Grandmother   . Stroke Maternal Grandfather        > 55  . Migraines Brother   . Heart attack Brother 30  . Colon cancer Neg Hx   . Esophageal cancer Neg Hx   . Rectal cancer Neg Hx   . Stomach cancer Neg Hx   . Liver cancer Neg Hx   . Pancreatic cancer Neg Hx      Past medical history, social, surgical and family history all reviewed in electronic medical record.  No pertanent information unless stated regarding to the chief complaint.   Review of Systems:Review of systems updated and as accurate as of 12/23/17  No headache, visual changes, nausea, vomiting,  diarrhea, constipation, dizziness, abdominal pain, skin rash, fevers, chills, night sweats, weight loss, swollen lymph nodes, body aches, joint swelling, muscle aches, chest pain, shortness of breath, mood changes.   Objective  There were no vitals taken for this visit. Systems examined below as of 12/23/17   General: No apparent distress alert and oriented x3 mood and affect normal, dressed appropriately.  HEENT: Pupils equal, extraocular movements intact  Respiratory: Patient's speak in full sentences and does not appear short of breath  Cardiovascular: No lower extremity edema, non tender, no erythema  Skin: Warm dry intact with no signs of infection or rash on extremities or on axial skeleton.  Abdomen: Soft nontender  Neuro: Cranial nerves II through XII are intact, neurovascularly intact in all extremities with 2+ DTRs and 2+ pulses.  Lymph: No lymphadenopathy of posterior or anterior cervical chain or axillae bilaterally.  Gait normal with good balance and coordination.  MSK:  Non tender with full range of motion and good stability and symmetric strength and tone of shoulders, elbows, wrist, hip, knee and ankles bilaterally.     Impression and Recommendations:     This case required medical decision making of moderate complexity.      Note: This dictation was prepared with Dragon dictation along with smaller phrase technology. Any transcriptional errors that result from this process are unintentional.

## 2017-12-24 ENCOUNTER — Ambulatory Visit: Payer: BLUE CROSS/BLUE SHIELD | Admitting: Family Medicine

## 2017-12-25 ENCOUNTER — Telehealth: Payer: Self-pay | Admitting: Pulmonary Disease

## 2017-12-25 ENCOUNTER — Ambulatory Visit (INDEPENDENT_AMBULATORY_CARE_PROVIDER_SITE_OTHER): Payer: BLUE CROSS/BLUE SHIELD | Admitting: Pulmonary Disease

## 2017-12-25 ENCOUNTER — Encounter: Payer: Self-pay | Admitting: Pulmonary Disease

## 2017-12-25 DIAGNOSIS — G4733 Obstructive sleep apnea (adult) (pediatric): Secondary | ICD-10-CM | POA: Diagnosis not present

## 2017-12-25 DIAGNOSIS — R053 Chronic cough: Secondary | ICD-10-CM

## 2017-12-25 DIAGNOSIS — R05 Cough: Secondary | ICD-10-CM | POA: Diagnosis not present

## 2017-12-25 MED ORDER — ALBUTEROL SULFATE HFA 108 (90 BASE) MCG/ACT IN AERS: 2.0000 | INHALATION_SPRAY | Freq: Four times a day (QID) | RESPIRATORY_TRACT | 6 refills | Status: DC | PRN

## 2017-12-25 NOTE — Patient Instructions (Signed)
Rx for albuterol MDI 2 puffs as needed for bouts of coughing.  CPAP supplies will be renewed for a year.  Try to increase your sleep time to 6 hours every night

## 2017-12-25 NOTE — Telephone Encounter (Signed)
Pt aware supplies should be sent to his home address and that Rx has been sent to pharmacy.  Rx was sent to pharmacy as listed below:  albuterol (PROVENTIL HFA;VENTOLIN HFA) 108 (90 Base) MCG/ACT inhaler 1 Inhaler 6 12/25/2017    Sig - Route: Inhale 2 puffs into the lungs every 6 (six) hours as needed for wheezing or shortness of breath. - Inhalation   Sent to pharmacy as: albuterol (PROVENTIL HFA;VENTOLIN HFA) 108 (90 Base) MCG/ACT inhaler   E-Prescribing Status: Receipt confirmed by pharmacy (12/25/2017 5:01 PM EDT)     Nothing more needed at this time.

## 2017-12-25 NOTE — Progress Notes (Signed)
   Subjective:    Patient ID: [name], male    DOB: [birth year], 52 y.o.   MRN: [id number]  HPI  Chief Complaint  Patient presents with  . follow up    Follow up for CPAP and SOB. SOB occurs throughout the day. Non productive cough.    52 year old man for follow-up of OSA and cough. He is settled down with nasal pillows but seems to have improved his daytime somnolence and fatigue.  He denies any problems with mask or pressure. He does report some sleepiness over the past few months.  He has been going on earlier than his first shift so wakes up around 3 AM and goes to bed around 10.  He tries to catch up from sleep on the weekends.  CPAP download was reviewed which shows excellent compliance but 5.5 hours on average, with average pressure of 11.5 cm with no residual events and minimal leak.  He also reports a cough that does not seem to have a seasonal variation or there are no.  He has bouts of dry coughing that seemed to spontaneously subside.  He does not report obvious reflux or sinus drip    Significant tests/ events reviewed  HST 09/2013: 24/hr PFTs 01/2017 normal 07/2017 CT angiogram normal appearance of lungs  Review of Systems neg for any significant sore throat, dysphagia, itching, sneezing, nasal congestion or excess/ purulent secretions, fever, chills, sweats, unintended wt loss, pleuritic or exertional cp, hempoptysis, orthopnea pnd or change in chronic leg swelling. Also denies presyncope, palpitations, heartburn, abdominal pain, nausea, vomiting, diarrhea or change in bowel or urinary habits, dysuria,hematuria, rash, arthralgias, visual complaints, headache, numbness weakness or ataxia.     Objective:   Physical Exam  Gen. Pleasant, obese, in no distress ENT - class 2 airway, no post nasal drip Neck: No JVD, no thyromegaly, no carotid bruits Lungs: no use of accessory muscles, no dullness to percussion, decreased without rales or rhonchi  Cardiovascular:  Rhythm regular, heart sounds  normal, no murmurs or gallops, no peripheral edema Musculoskeletal: No deformities, no cyanosis or clubbing , no tremors       Assessment & Plan:

## 2017-12-25 NOTE — Assessment & Plan Note (Signed)
Probably related to reflux rather than sinus drip. Continue on Protonix  Rx for albuterol MDI 2 puffs as needed for bouts of coughing.

## 2017-12-25 NOTE — Assessment & Plan Note (Signed)
CPAP supplies will be renewed for a year.  Try to increase your sleep time to 6 hours every night  Weight loss encouraged, compliance with goal of at least 4-6 hrs every night is the expectation. Advised against medications with sedative side effects Cautioned against driving when sleepy - understanding that sleepiness will vary on a day to day basis

## 2017-12-25 NOTE — Addendum Note (Signed)
Addended by: Valerie Salts on: 12/25/2017 05:01 PM   Modules accepted: Orders

## 2017-12-30 NOTE — Progress Notes (Deleted)
[name] [place], [place] [address] Phone: [phone] Subjective:    I'm seeing this patient by the request  of:    CC:   [id number]  [name] is a 52 y.o. male coming in with complaint of ***  Onset-  Location Duration-  Character- Aggravating factors- Reliving factors-  Therapies tried-  Severity-     Past Medical History:  Diagnosis Date  . Anxiety   . Arthritis    knees - no med  . Cluster headache    Hx - resolved per patient  . Depression   . DVT of lower extremity (deep venous thrombosis) ([facility]) 6/26-28/2013   Xarelto - resolved - history  . HTN (hypertension)    currently no meds  . Irritability and anger    PMH of  . Kidney stone    passed stone, no surgery required.  . Rotator cuff arthropathy 2007   right  . Saddle pulmonary embolus ([facility]) 6/26-28/2013   post orthopedic surgery - resolved  . Sleep apnea 5/15   moderate OSA-has cpap but does not use it   Past Surgical History:  Procedure Laterality Date  . arthroscopic knee surgery  10/09/2011   Dr Ninfa Linden  . CARDIAC CATHETERIZATION  2007   LVdysfunction; normal coronaries  . KNEE ARTHROSCOPY WITH MEDIAL MENISECTOMY Left 09/20/2014   medial menisectomy chondroplastytella medial plica excision  ;  Surgeon: Dorna Leitz, MD;  Location: [facility];  Service: Orthopedics;  Laterality: Left;  . ROTATOR CUFF REPAIR  2007   right  . UPPER GASTROINTESTINAL ENDOSCOPY  2007   normal  . WISDOM TOOTH EXTRACTION     Social History   Socioeconomic History  . Marital status: Single    Spouse name: Not on file  . Number of children: 6  . Years of education: 66  . Highest education level: Not on file  Occupational History  . Occupation: Glass blower/designer   . Occupation: Programmer, systems  Social Needs  . Financial resource strain: Not on file  . Food insecurity:    Worry: Not on file    Inability: Not on file  . Transportation  needs:    Medical: Not on file    Non-medical: Not on file  Tobacco Use  . Smoking status: Former Smoker    Types: Cigars  . Smokeless tobacco: Never Used  . Tobacco comment: every 3-4 months  Substance and Sexual Activity  . Alcohol use: Yes    Alcohol/week: 0.0 oz    Comment: drinks occasionally  . Drug use: No  . Sexual activity: Not on file  Lifestyle  . Physical activity:    Days per week: Not on file    Minutes per session: Not on file  . Stress: Not on file  Relationships  . Social connections:    Talks on phone: Not on file    Gets together: Not on file    Attends religious service: Not on file    Active member of club or organization: Not on file    Attends meetings of clubs or organizations: Not on file    Relationship status: Not on file  Other Topics Concern  . Not on file  Social History Narrative   Fun: Exercise   Allergies  Allergen Reactions  . Tramadol Other (See Comments)    insomnia   Family History  Problem Relation Age of Onset  . Cancer Mother  Mesothelioma   . Diabetes Brother   . Hypertension Brother   . Stroke Brother 3  . Heart attack Brother 91  . Diabetes Maternal Aunt   . Diabetes Maternal Grandmother   . Heart disease Maternal Grandmother   . Hypertension Maternal Grandmother   . Stroke Maternal Grandmother        in 61s  . Deep vein thrombosis Maternal Grandmother   . Stroke Maternal Grandfather        > 55  . Migraines Brother   . Heart attack Brother 74  . Colon cancer Neg Hx   . Esophageal cancer Neg Hx   . Rectal cancer Neg Hx   . Stomach cancer Neg Hx   . Liver cancer Neg Hx   . Pancreatic cancer Neg Hx      Past medical history, social, surgical and family history all reviewed in electronic medical record.  No pertanent information unless stated regarding to the chief complaint.   Review of Systems:Review of systems updated and as accurate as of 12/30/17  No headache, visual changes, nausea, vomiting,  diarrhea, constipation, dizziness, abdominal pain, skin rash, fevers, chills, night sweats, weight loss, swollen lymph nodes, body aches, joint swelling, muscle aches, chest pain, shortness of breath, mood changes.   Objective  There were no vitals taken for this visit. Systems examined below as of 12/30/17   General: No apparent distress alert and oriented x3 mood and affect normal, dressed appropriately.  HEENT: Pupils equal, extraocular movements intact  Respiratory: Patient's speak in full sentences and does not appear short of breath  Cardiovascular: No lower extremity edema, non tender, no erythema  Skin: Warm dry intact with no signs of infection or rash on extremities or on axial skeleton.  Abdomen: Soft nontender  Neuro: Cranial nerves II through XII are intact, neurovascularly intact in all extremities with 2+ DTRs and 2+ pulses.  Lymph: No lymphadenopathy of posterior or anterior cervical chain or axillae bilaterally.  Gait normal with good balance and coordination.  MSK:  Non tender with full range of motion and good stability and symmetric strength and tone of shoulders, elbows, wrist, hip, knee and ankles bilaterally.     Impression and Recommendations:     This case required medical decision making of moderate complexity.      Note: This dictation was prepared with Dragon dictation along with smaller phrase technology. Any transcriptional errors that result from this process are unintentional.

## 2017-12-31 ENCOUNTER — Ambulatory Visit: Payer: BLUE CROSS/BLUE SHIELD | Admitting: Family Medicine

## 2018-01-04 ENCOUNTER — Ambulatory Visit: Payer: BLUE CROSS/BLUE SHIELD | Admitting: Family Medicine

## 2018-01-04 NOTE — Progress Notes (Deleted)
[name] [address] [place], [place] [address] Phone: [phone] Subjective:    I'm seeing this patient by the request  of:    CC:   [id number]  [name] is a 52 y.o. male coming in with complaint of ***  Onset-  Location Duration-  Character- Aggravating factors- Reliving factors-  Therapies tried-  Severity-     Past Medical History:  Diagnosis Date  . Anxiety   . Arthritis    knees - no med  . Cluster headache    Hx - resolved per patient  . Depression   . DVT of lower extremity (deep venous thrombosis) ([facility]) 6/26-28/2013   Xarelto - resolved - history  . HTN (hypertension)    currently no meds  . Irritability and anger    PMH of  . Kidney stone    passed stone, no surgery required.  . Rotator cuff arthropathy 2007   right  . Saddle pulmonary embolus ([facility]) 6/26-28/2013   post orthopedic surgery - resolved  . Sleep apnea 5/15   moderate OSA-has cpap but does not use it   Past Surgical History:  Procedure Laterality Date  . arthroscopic knee surgery  10/09/2011   Dr Ninfa Linden  . CARDIAC CATHETERIZATION  2007   LVdysfunction; normal coronaries  . KNEE ARTHROSCOPY WITH MEDIAL MENISECTOMY Left 09/20/2014   medial menisectomy chondroplastytella medial plica excision  ;  Surgeon: Dorna Leitz, MD;  Location: [facility];  Service: Orthopedics;  Laterality: Left;  . ROTATOR CUFF REPAIR  2007   right  . UPPER GASTROINTESTINAL ENDOSCOPY  2007   normal  . WISDOM TOOTH EXTRACTION     Social History   Socioeconomic History  . Marital status: Single    Spouse name: Not on file  . Number of children: 6  . Years of education: 34  . Highest education level: Not on file  Occupational History  . Occupation: Glass blower/designer   . Occupation: Programmer, systems  Social Needs  . Financial resource strain: Not on file  . Food insecurity:    Worry: Not on file    Inability: Not on file  . Transportation  needs:    Medical: Not on file    Non-medical: Not on file  Tobacco Use  . Smoking status: Former Smoker    Types: Cigars  . Smokeless tobacco: Never Used  . Tobacco comment: every 3-4 months  Substance and Sexual Activity  . Alcohol use: Yes    Alcohol/week: 0.0 oz    Comment: drinks occasionally  . Drug use: No  . Sexual activity: Not on file  Lifestyle  . Physical activity:    Days per week: Not on file    Minutes per session: Not on file  . Stress: Not on file  Relationships  . Social connections:    Talks on phone: Not on file    Gets together: Not on file    Attends religious service: Not on file    Active member of club or organization: Not on file    Attends meetings of clubs or organizations: Not on file    Relationship status: Not on file  Other Topics Concern  . Not on file  Social History Narrative   Fun: Exercise   Allergies  Allergen Reactions  . Tramadol Other (See Comments)    insomnia   Family History  Problem Relation Age of Onset  . Cancer Mother  Mesothelioma   . Diabetes Brother   . Hypertension Brother   . Stroke Brother 46  . Heart attack Brother 86  . Diabetes Maternal Aunt   . Diabetes Maternal Grandmother   . Heart disease Maternal Grandmother   . Hypertension Maternal Grandmother   . Stroke Maternal Grandmother        in 64s  . Deep vein thrombosis Maternal Grandmother   . Stroke Maternal Grandfather        > 55  . Migraines Brother   . Heart attack Brother 53  . Colon cancer Neg Hx   . Esophageal cancer Neg Hx   . Rectal cancer Neg Hx   . Stomach cancer Neg Hx   . Liver cancer Neg Hx   . Pancreatic cancer Neg Hx      Past medical history, social, surgical and family history all reviewed in electronic medical record.  No pertanent information unless stated regarding to the chief complaint.   Review of Systems:Review of systems updated and as accurate as of 01/04/18  No headache, visual changes, nausea, vomiting,  diarrhea, constipation, dizziness, abdominal pain, skin rash, fevers, chills, night sweats, weight loss, swollen lymph nodes, body aches, joint swelling, muscle aches, chest pain, shortness of breath, mood changes.   Objective  There were no vitals taken for this visit. Systems examined below as of 01/04/18   General: No apparent distress alert and oriented x3 mood and affect normal, dressed appropriately.  HEENT: Pupils equal, extraocular movements intact  Respiratory: Patient's speak in full sentences and does not appear short of breath  Cardiovascular: No lower extremity edema, non tender, no erythema  Skin: Warm dry intact with no signs of infection or rash on extremities or on axial skeleton.  Abdomen: Soft nontender  Neuro: Cranial nerves II through XII are intact, neurovascularly intact in all extremities with 2+ DTRs and 2+ pulses.  Lymph: No lymphadenopathy of posterior or anterior cervical chain or axillae bilaterally.  Gait normal with good balance and coordination.  MSK:  Non tender with full range of motion and good stability and symmetric strength and tone of shoulders, elbows, wrist, hip, knee and ankles bilaterally.     Impression and Recommendations:     This case required medical decision making of moderate complexity.      Note: This dictation was prepared with Dragon dictation along with smaller phrase technology. Any transcriptional errors that result from this process are unintentional.

## 2018-01-05 ENCOUNTER — Ambulatory Visit: Payer: BLUE CROSS/BLUE SHIELD | Admitting: Family Medicine

## 2018-01-12 ENCOUNTER — Ambulatory Visit: Payer: BLUE CROSS/BLUE SHIELD | Admitting: Family Medicine

## 2018-01-13 DIAGNOSIS — G4733 Obstructive sleep apnea (adult) (pediatric): Secondary | ICD-10-CM | POA: Diagnosis not present

## 2018-01-20 ENCOUNTER — Encounter: Payer: Self-pay | Admitting: Internal Medicine

## 2018-01-20 ENCOUNTER — Ambulatory Visit (INDEPENDENT_AMBULATORY_CARE_PROVIDER_SITE_OTHER): Payer: BLUE CROSS/BLUE SHIELD | Admitting: Internal Medicine

## 2018-01-20 ENCOUNTER — Other Ambulatory Visit (INDEPENDENT_AMBULATORY_CARE_PROVIDER_SITE_OTHER): Payer: BLUE CROSS/BLUE SHIELD

## 2018-01-20 VITALS — BP 130/88 | HR 80 | Temp 98.8°F | Resp 16 | Ht 67.0 in | Wt 261.2 lb

## 2018-01-20 DIAGNOSIS — K279 Peptic ulcer, site unspecified, unspecified as acute or chronic, without hemorrhage or perforation: Secondary | ICD-10-CM

## 2018-01-20 DIAGNOSIS — I10 Essential (primary) hypertension: Secondary | ICD-10-CM | POA: Diagnosis not present

## 2018-01-20 DIAGNOSIS — E782 Mixed hyperlipidemia: Secondary | ICD-10-CM | POA: Diagnosis not present

## 2018-01-20 DIAGNOSIS — Z23 Encounter for immunization: Secondary | ICD-10-CM | POA: Diagnosis not present

## 2018-01-20 DIAGNOSIS — Z0000 Encounter for general adult medical examination without abnormal findings: Secondary | ICD-10-CM | POA: Diagnosis not present

## 2018-01-20 DIAGNOSIS — R739 Hyperglycemia, unspecified: Secondary | ICD-10-CM | POA: Diagnosis not present

## 2018-01-20 LAB — BASIC METABOLIC PANEL
BUN: 17 mg/dL (ref 6–23)
CHLORIDE: 108 meq/L (ref 96–112)
CO2: 25 meq/L (ref 19–32)
CREATININE: 0.95 mg/dL (ref 0.40–1.50)
Calcium: 9.4 mg/dL (ref 8.4–10.5)
GFR: 106.94 mL/min (ref >60.00)
Glucose, Bld: 85 mg/dL (ref 70–99)
Potassium: 4 mEq/L (ref 3.5–5.1)
Sodium: 142 mEq/L (ref 135–145)

## 2018-01-20 LAB — HEMOGLOBIN A1C: HEMOGLOBIN A1C: 6.2 % (ref 4.6–6.5)

## 2018-01-20 LAB — PSA: PSA: 0.36 ng/mL (ref 0.10–4.00)

## 2018-01-20 NOTE — Patient Instructions (Signed)

## 2018-01-20 NOTE — Progress Notes (Signed)
Subjective:  Patient ID: [name], male    DOB: 16-Feb-1966  Age: 52 y.o. MRN: [id number]  CC: Annual Exam and Gastroesophageal Reflux   HPI [name] presents for a CPX.  Since I last saw him he has undergone an EGD and was found to have peptic ulcer disease with Helicobacter pylori infection.  He tells me he completed the antibiotic regimen and is no longer taking anti-inflammatories.  He also tells me he is compliant with the PPI.  He denies any recent episodes of odynophagia, dysphagia, abdominal pain, melena, or weight loss.  He feels well today and offers no complaints.  Outpatient Medications Prior to Visit  Medication Sig Dispense Refill  . albuterol (PROVENTIL HFA;VENTOLIN HFA) 108 (90 Base) MCG/ACT inhaler Inhale 2 puffs into the lungs every 6 (six) hours as needed for wheezing or shortness of breath. 1 Inhaler 6  . carvedilol (COREG) 3.125 MG tablet Take 1 tablet (3.125 mg total) by mouth 2 (two) times daily. 180 tablet 3  . Multiple Vitamin (MULTIVITAMIN WITH MINERALS) TABS tablet Take 1 tablet by mouth daily.    . pantoprazole (PROTONIX) 40 MG tablet Take 1 tablet (40 mg total) by mouth daily. 90 tablet 1  . sildenafil (REVATIO) 20 MG tablet TAKE THREE TABLETS BY MOUTH DAILY AS NEEDED 50 tablet 5  . Naproxen Sodium (ALEVE PO) Take 800 mg by mouth 2 (two) times daily as needed.     No facility-administered medications prior to visit.     ROS Review of Systems  Constitutional: Negative.  Negative for appetite change, diaphoresis, fatigue and unexpected weight change.  HENT: Negative.  Negative for trouble swallowing.   Eyes: Negative for visual disturbance.  Respiratory: Negative for cough, chest tightness, shortness of breath and wheezing.   Cardiovascular: Negative.  Negative for chest pain, palpitations and leg swelling.  Gastrointestinal: Negative for abdominal pain, blood in stool, constipation, diarrhea, nausea and vomiting.  Endocrine: Negative.     Genitourinary: Negative.  Negative for difficulty urinating, penile swelling, scrotal swelling, testicular pain and urgency.  Musculoskeletal: Negative.  Negative for arthralgias and myalgias.  Skin: Negative.  Negative for color change and pallor.  Neurological: Negative.   Hematological: Negative for adenopathy. Does not bruise/bleed easily.  Psychiatric/Behavioral: Negative.     Objective:  BP 130/88 (BP Location: Left Arm, Patient Position: Sitting, Cuff Size: Large)   Pulse 80   Temp 98.8 F (37.1 C) (Oral)   Resp 16   Ht 5\' 7"  (1.702 m)   Wt 261 lb 4 oz (118.5 kg)   SpO2 94%   BMI 40.92 kg/m   BP Readings from Last 3 Encounters:  01/20/18 130/88  12/25/17 128/82  12/17/17 110/80    Wt Readings from Last 3 Encounters:  01/20/18 261 lb 4 oz (118.5 kg)  12/25/17 263 lb 12.8 oz (119.7 kg)  12/17/17 269 lb (122 kg)    Physical Exam  Constitutional: He is oriented to person, place, and time. No distress.  HENT:  Mouth/Throat: Oropharynx is clear and moist. No oropharyngeal exudate.  Eyes: Conjunctivae are normal. No scleral icterus.  Neck: Normal range of motion. Neck supple. No JVD present. No thyromegaly present.  Cardiovascular: Normal rate, regular rhythm and normal heart sounds. Exam reveals no gallop and no friction rub.  No murmur heard. Pulmonary/Chest: Effort normal and breath sounds normal. No respiratory distress. He has no wheezes. He has no rales.  Abdominal: Soft. Normal appearance and bowel sounds are normal. He exhibits no  mass. There is no hepatosplenomegaly. There is no tenderness. Hernia confirmed negative in the right inguinal area and confirmed negative in the left inguinal area.  Genitourinary: Rectum normal, prostate normal, testes normal and penis normal. Rectal exam shows no external hemorrhoid, no internal hemorrhoid, no fissure, no mass, no tenderness, anal tone normal and guaiac negative stool. Prostate is not enlarged and not tender. Right  testis shows no mass, no swelling and no tenderness. Left testis shows no mass, no swelling and no tenderness. Circumcised. No penile erythema or penile tenderness. No discharge found.  Musculoskeletal: Normal range of motion. He exhibits no edema, tenderness or deformity.  Lymphadenopathy:    He has no cervical adenopathy. No inguinal adenopathy noted on the right or left side.  Neurological: He is alert and oriented to person, place, and time.  Skin: He is not diaphoretic.  Vitals reviewed.   Lab Results  Component Value Date   WBC 9.5 08/24/2017   HGB 14.8 08/24/2017   HCT 44.4 08/24/2017   PLT 261.0 08/24/2017   GLUCOSE 85 01/20/2018   CHOL 175 08/19/2017   TRIG 87 08/19/2017   HDL 61 08/19/2017   LDLCALC 97 08/19/2017   ALT 27 08/24/2017   AST 14 08/24/2017   NA 142 01/20/2018   K 4.0 01/20/2018   CL 108 01/20/2018   CREATININE 0.95 01/20/2018   BUN 17 01/20/2018   CO2 25 01/20/2018   TSH 0.94 11/11/2016   PSA 0.36 01/20/2018   INR CANCELED 08/12/2013   HGBA1C 6.2 01/20/2018    Ct Abdomen Pelvis W Contrast  Result Date: 09/07/2017 CLINICAL DATA:  Mid abdominal pain EXAM: CT ABDOMEN AND PELVIS WITH CONTRAST TECHNIQUE: Multidetector CT imaging of the abdomen and pelvis was performed using the standard protocol following bolus administration of intravenous contrast. CONTRAST:  17mL ISOVUE-300 IOPAMIDOL (ISOVUE-300) INJECTION 61% COMPARISON:  08/24/2017, MRI 03/29/2017, ultrasound 01/23/2017 FINDINGS: Lower chest: No acute abnormality. Hepatobiliary: Subcentimeter hypodensity posterior right hepatic lobe too small to further characterize. No calcified gallstones or biliary dilatation Pancreas: Unremarkable. No pancreatic ductal dilatation or surrounding inflammatory changes. Spleen: Normal in size without focal abnormality. Adrenals/Urinary Tract: 1 cm right adrenal gland adenoma. Left adrenal gland normal. Cysts in the right kidney. 5 mm nonobstructing stone lower pole left  kidney. Bladder within normal limits. Stomach/Bowel: Possible focal wall thickening at the greater curvature of the stomach. High-riding cecum in the right upper quadrant. Negative appendix. Descending and sigmoid colon diverticula without active inflammation. Vascular/Lymphatic: Mild aortic atherosclerosis. No aneurysmal dilatation. No significantly enlarged lymph nodes. Reproductive: Prostate is unremarkable. Other: Negative for free air or free fluid. Small fat in the umbilical region Musculoskeletal: No acute or significant osseous findings. IMPRESSION: 1. Possible focal wall thickening/mass at the greater curvature of the stomach, suggest correlation with direct visualization/endoscopy given history of epigastric pain 2. 1 cm right adrenal gland adenoma 3. Nonobstructing stone in the left kidney Electronically Signed   By: Donavan Foil M.D.   On: 09/07/2017 16:22    Assessment & Plan:   Tymel was seen today for annual exam and gastroesophageal reflux.  Diagnoses and all orders for this visit:  Essential hypertension- His blood pressure is well controlled.  Hyperglycemia- His A1c is at 6.2%.  He is prediabetic.  Medical therapy is not indicated.  He was encouraged to improve his lifestyle modifications. -     Basic metabolic panel; Future -     Hemoglobin A1c; Future  PUD (peptic ulcer disease)- Improvement noted.  Need for  hepatitis A vaccination -     Hepatitis A vaccine adult IM  Mixed hyperlipidemia- He does not have an elevated ASCVD risk score so at this time I do not recommend a statin for CV risk reduction.  Routine general medical examination at a health care facility- Exam completed, labs reviewed, vaccines reviewed and updated, screening for colon cancer is up-to-date, patient education material was given. -     PSA; Future   I have discontinued Griffen Frayne. Todaro's Naproxen Sodium (ALEVE PO). I am also having him maintain his sildenafil, pantoprazole, multivitamin with  minerals, carvedilol, and albuterol.  No orders of the defined types were placed in this encounter.    Follow-up: Return in about 6 months (around 07/23/2018).  Scarlette Calico, MD

## 2018-01-21 ENCOUNTER — Encounter: Payer: Self-pay | Admitting: Internal Medicine

## 2018-01-26 DIAGNOSIS — G4733 Obstructive sleep apnea (adult) (pediatric): Secondary | ICD-10-CM | POA: Diagnosis not present

## 2018-03-08 ENCOUNTER — Emergency Department (HOSPITAL_COMMUNITY): Payer: BLUE CROSS/BLUE SHIELD

## 2018-03-08 ENCOUNTER — Emergency Department (HOSPITAL_COMMUNITY)
Admission: EM | Admit: 2018-03-08 | Discharge: 2018-03-08 | Disposition: A | Discharge status: Home / Self Care | Payer: BLUE CROSS/BLUE SHIELD | Attending: Emergency Medicine | Admitting: Emergency Medicine

## 2018-03-08 ENCOUNTER — Encounter (HOSPITAL_COMMUNITY): Payer: Self-pay | Admitting: Emergency Medicine

## 2018-03-08 ENCOUNTER — Other Ambulatory Visit: Payer: Self-pay

## 2018-03-08 DIAGNOSIS — R079 Chest pain, unspecified: Secondary | ICD-10-CM | POA: Diagnosis not present

## 2018-03-08 DIAGNOSIS — Z79899 Other long term (current) drug therapy: Secondary | ICD-10-CM | POA: Diagnosis not present

## 2018-03-08 DIAGNOSIS — Z87891 Personal history of nicotine dependence: Secondary | ICD-10-CM | POA: Insufficient documentation

## 2018-03-08 DIAGNOSIS — R0789 Other chest pain: Secondary | ICD-10-CM | POA: Insufficient documentation

## 2018-03-08 DIAGNOSIS — I10 Essential (primary) hypertension: Secondary | ICD-10-CM | POA: Insufficient documentation

## 2018-03-08 DIAGNOSIS — R9431 Abnormal electrocardiogram [ECG] [EKG]: Secondary | ICD-10-CM | POA: Diagnosis not present

## 2018-03-08 LAB — CBC
HCT: 45.2 % (ref 39.0–52.0)
Hemoglobin: 14.5 g/dL (ref 13.0–17.0)
MCH: 29.1 pg (ref 26.0–34.0)
MCHC: 32.1 g/dL (ref 30.0–36.0)
MCV: 90.6 fL (ref 78.0–100.0)
Platelets: 252 10*3/uL (ref 150–400)
RBC: 4.99 MIL/uL (ref 4.22–5.81)
RDW: 13.4 % (ref 11.5–15.5)
WBC: 12.6 10*3/uL — AB (ref 4.0–10.5)

## 2018-03-08 LAB — BASIC METABOLIC PANEL
Anion gap: 8 (ref 5–15)
BUN: 13 mg/dL (ref 6–20)
CHLORIDE: 105 mmol/L (ref 98–111)
CO2: 25 mmol/L (ref 22–32)
Calcium: 9.1 mg/dL (ref 8.9–10.3)
Creatinine, Ser: 1.01 mg/dL (ref 0.61–1.24)
GFR calc Af Amer: >60 mL/min (ref >60)
GFR calc non Af Amer: >60 mL/min (ref >60)
Glucose, Bld: 125 mg/dL — ABNORMAL HIGH (ref 70–99)
Potassium: 3.8 mmol/L (ref 3.5–5.1)
SODIUM: 138 mmol/L (ref 135–145)

## 2018-03-08 LAB — D-DIMER, QUANTITATIVE: D-Dimer, Quant: 0.9 ug/mL-FEU — ABNORMAL HIGH (ref 0.00–0.50)

## 2018-03-08 LAB — BRAIN NATRIURETIC PEPTIDE: B NATRIURETIC PEPTIDE 5: 15.2 pg/mL (ref 0.0–100.0)

## 2018-03-08 LAB — I-STAT TROPONIN, ED: TROPONIN I, POC: 0 ng/mL (ref 0.00–0.08)

## 2018-03-08 MED ORDER — IOPAMIDOL (ISOVUE-370) INJECTION 76%
100.0000 mL | Freq: Once | INTRAVENOUS | Status: AC | PRN
  Administered 2018-03-08: 100 mL via INTRAVENOUS

## 2018-03-08 MED ORDER — KETOROLAC TROMETHAMINE 30 MG/ML IJ SOLN
15.0000 mg | Freq: Once | INTRAMUSCULAR | Status: AC
  Administered 2018-03-08: 15 mg via INTRAVENOUS
  Filled 2018-03-08: qty 1

## 2018-03-08 MED ORDER — SODIUM CHLORIDE 0.9 % IV BOLUS
500.0000 mL | Freq: Once | INTRAVENOUS | Status: AC
  Administered 2018-03-08: 500 mL via INTRAVENOUS

## 2018-03-08 MED ORDER — MORPHINE SULFATE (PF) 4 MG/ML IV SOLN
4.0000 mg | Freq: Once | INTRAVENOUS | Status: AC
  Administered 2018-03-08: 4 mg via INTRAVENOUS
  Filled 2018-03-08: qty 1

## 2018-03-08 MED ORDER — CELECOXIB 50 MG PO CAPS: 50.0000 mg | ORAL_CAPSULE | Freq: Two times a day (BID) | ORAL | 0 refills | Status: AC

## 2018-03-08 MED ORDER — IOPAMIDOL (ISOVUE-370) INJECTION 76%
INTRAVENOUS | Status: AC
  Filled 2018-03-08: qty 100

## 2018-03-08 NOTE — ED Provider Notes (Signed)
[facility] Provider Note   CSN: [id number] Arrival date & time: 03/08/18  1922     History   Chief Complaint Chief Complaint  Patient presents with  . Chest Pain    HPI [name] is a 52 y.o. male.  HPI Presents with concern of chest pain, dyspnea. Over the last 3 days he has had multiple episodes of chest pain, and his most recent episode began about 5 hours prior to my evaluation peer Pain is on the left side, with radiation in the left arm. There is associated mild dyspnea, no nausea, no vomiting, no syncope. Pain is sharp, not clearly worse with exertion or inspiration. Patient has a notable history of prior PE, no known history of CAD.  Past Medical History:  Diagnosis Date  . Anxiety   . Arthritis    knees - no med  . Cluster headache    Hx - resolved per patient  . Depression   . DVT of lower extremity (deep venous thrombosis) ([place]) 6/26-28/2013   Xarelto - resolved - history  . HTN (hypertension)    currently no meds  . Irritability and anger    PMH of  . Kidney stone    passed stone, no surgery required.  . Rotator cuff arthropathy 2007   right  . Saddle pulmonary embolus ([place]) 6/26-28/2013   post orthopedic surgery - resolved  . Sleep apnea 5/15   moderate OSA-has cpap but does not use it    Patient Active Problem List   Diagnosis Date Noted  . PUD (peptic ulcer disease) 01/20/2018  . Renal stone 08/26/2017  . Moderate obesity 08/25/2017  . GERD with esophagitis 08/24/2017  . Degenerative arthritis of knee, bilateral 06/08/2017  . Venous insufficiency of both lower extremities 10/05/2015  . Routine general medical examination at a health care facility 08/21/2015  . Heterozygous factor V Leiden mutation ([place]) 08/24/2013  . OSA (obstructive sleep apnea) 08/13/2013  . Hyperglycemia 11/10/2008  . Mixed hyperlipidemia 07/06/2008  . Essential hypertension 07/06/2008  . ERECTILE DYSFUNCTION, ORGANIC  01/28/2007    Past Surgical History:  Procedure Laterality Date  . arthroscopic knee surgery  10/09/2011   Dr Ninfa Linden  . CARDIAC CATHETERIZATION  2007   LVdysfunction; normal coronaries  . KNEE ARTHROSCOPY WITH MEDIAL MENISECTOMY Left 09/20/2014   medial menisectomy chondroplastytella medial plica excision  ;  Surgeon: Dorna Leitz, MD;  Location: [facility];  Service: Orthopedics;  Laterality: Left;  . ROTATOR CUFF REPAIR  2007   right  . UPPER GASTROINTESTINAL ENDOSCOPY  2007   normal  . WISDOM TOOTH EXTRACTION          Home Medications    Prior to Admission medications   Medication Sig Start Date End Date Taking? Authorizing Provider  albuterol (PROVENTIL HFA;VENTOLIN HFA) 108 (90 Base) MCG/ACT inhaler Inhale 2 puffs into the lungs every 6 (six) hours as needed for wheezing or shortness of breath. 12/25/17   Rigoberto Noel, MD  carvedilol (COREG) 3.125 MG tablet Take 1 tablet (3.125 mg total) by mouth 2 (two) times daily. 10/06/17 01/04/18  Pixie Casino, MD  Multiple Vitamin (MULTIVITAMIN WITH MINERALS) TABS tablet Take 1 tablet by mouth daily.    [provider]  pantoprazole (PROTONIX) 40 MG tablet Take 1 tablet (40 mg total) by mouth daily. 08/24/17   [name], MD  sildenafil (REVATIO) 20 MG tablet TAKE THREE TABLETS BY MOUTH DAILY AS NEEDED 07/28/17   Jenny Reichmann,  Hunt Oris, MD    Family History Family History  Problem Relation Age of Onset  . Cancer Mother        Mesothelioma   . Diabetes Brother   . Hypertension Brother   . Stroke Brother 73  . Heart attack Brother 37  . Diabetes Maternal Aunt   . Diabetes Maternal Grandmother   . Heart disease Maternal Grandmother   . Hypertension Maternal Grandmother   . Stroke Maternal Grandmother        in 73s  . Deep vein thrombosis Maternal Grandmother   . Stroke Maternal Grandfather        > 55  . Migraines Brother   . Heart attack Brother 11  . Colon cancer Neg Hx   . Esophageal cancer Neg Hx   .  Rectal cancer Neg Hx   . Stomach cancer Neg Hx   . Liver cancer Neg Hx   . Pancreatic cancer Neg Hx     Social History Social History   Tobacco Use  . Smoking status: Former Smoker    Types: Cigars  . Smokeless tobacco: Never Used  . Tobacco comment: every 3-4 months  Substance Use Topics  . Alcohol use: Yes    Alcohol/week: 0.0 standard drinks    Comment: drinks occasionally  . Drug use: No     Allergies   Tramadol   Review of Systems Review of Systems  Constitutional:       Per HPI, otherwise negative  HENT:       Per HPI, otherwise negative  Respiratory:       Per HPI, otherwise negative  Cardiovascular:       Per HPI, otherwise negative  Gastrointestinal: Negative for vomiting.  Endocrine:       Negative aside from HPI  Genitourinary:       Neg aside from HPI   Musculoskeletal:       Per HPI, otherwise negative  Skin: Negative.   Neurological: Negative for syncope.     Physical Exam Updated Vital Signs BP 120/82   Pulse 80   Temp 97.9 F (36.6 C) (Oral)   Resp (!) 33   Ht 5\' 7"  (1.702 m)   Wt 118.4 kg   SpO2 97%   BMI 40.88 kg/m   Physical Exam  Constitutional: He is oriented to person, place, and time. He appears well-developed. No distress.  HENT:  Head: Normocephalic and atraumatic.  Eyes: Conjunctivae and EOM are normal.  Cardiovascular: Normal rate and regular rhythm.  Pulmonary/Chest: Effort normal. No stridor. No respiratory distress.  Abdominal: He exhibits no distension.  Musculoskeletal: He exhibits no edema.  Neurological: He is alert and oriented to person, place, and time.  Skin: Skin is warm and dry.  Psychiatric: He has a normal mood and affect.  Nursing note and vitals reviewed.    ED Treatments / Results  Labs (all labs ordered are listed, but only abnormal results are displayed) Labs Reviewed  BASIC METABOLIC PANEL - Abnormal; Notable for the following components:      Result Value   Glucose, Bld 125 (*)    All  other components within normal limits  CBC - Abnormal; Notable for the following components:   WBC 12.6 (*)    All other components within normal limits  D-DIMER, QUANTITATIVE (NOT AT [facility]) - Abnormal; Notable for the following components:   D-Dimer, Quant 0.90 (*)    All other components within normal limits  BRAIN NATRIURETIC PEPTIDE  I-STAT TROPONIN,  ED    EKG EKG Interpretation  Date/Time:  Monday March 08 2018 19:24:00 EDT Ventricular Rate:  86 PR Interval:  138 QRS Duration: 94 QT Interval:  358 QTC Calculation: 428 R Axis:   33 Text Interpretation:  Normal sinus rhythm Septal infarct , age undetermined No significant change since last tracing Abnormal ekg Confirmed by Carmin Muskrat [phone]) on 03/08/2018 7:56:12 PM   Radiology Dg Chest 2 View  Result Date: 03/08/2018 CLINICAL DATA:  Chest pain since this morning at 11 a.m. Left arm tingling sensations since 10 a.m. Ex-smoker. EXAM: CHEST - 2 VIEW COMPARISON:  08/24/2017 and chest CTA dated 08/19/2017. FINDINGS: Poor inspiration. Normal sized heart. Clear lungs with normal vascularity. Stable mild diffuse peribronchial thickening and accentuation of the interstitial markings. Lower thoracic spine degenerative changes. IMPRESSION: No acute abnormality.  Stable mild chronic bronchitic changes. Electronically Signed   By: Claudie Revering M.D.   On: 03/08/2018 20:44   Ct Angio Chest Pe W/cm &/or Wo Cm  Result Date: 03/08/2018 CLINICAL DATA:  Chest pain for several hours EXAM: CT ANGIOGRAPHY CHEST WITH CONTRAST TECHNIQUE: Multidetector CT imaging of the chest was performed using the standard protocol during bolus administration of intravenous contrast. Multiplanar CT image reconstructions and MIPs were obtained to evaluate the vascular anatomy. CONTRAST:  100 mL ISOVUE-370 IOPAMIDOL (ISOVUE-370) INJECTION 76% COMPARISON:  Chest x-ray from earlier in the same day, ultrasound from 01/23/2017 and MRI from 03/29/2017. FINDINGS:  Cardiovascular: Thoracic aorta demonstrates no significant atherosclerotic calcifications. Mild coronary calcifications are seen. No cardiac enlargement is noted. The pulmonary artery demonstrates a normal branching pattern. No filling defects to suggest pulmonary emboli are identified. Mediastinum/Nodes: Thoracic inlet is within normal limits. No sizable hilar or mediastinal adenopathy is noted. The esophagus is within normal limits as visualized. Lungs/Pleura: Mild dependent atelectatic changes are noted. No focal confluent infiltrate or sizable effusion is seen. Upper Abdomen: Hypodense lesion is noted in the upper pole of the right kidney medially. Musculoskeletal: Degenerative changes of thoracic spine are noted. Review of the MIP images confirms the above findings. IMPRESSION: No evidence of pulmonary emboli. Hypodense lesion in the right kidney stable over multiple previous exams consistent with a benign cyst. Electronically Signed   By: Inez Catalina M.D.   On: 03/08/2018 22:11    Procedures Procedures (including critical care time)  Medications Ordered in ED Medications  ketorolac (TORADOL) 30 MG/ML injection 15 mg (15 mg Intravenous Given 03/08/18 2115)  morphine 4 MG/ML injection 4 mg (4 mg Intravenous Given 03/08/18 2116)  sodium chloride 0.9 % bolus 500 mL (0 mLs Intravenous Stopped 03/08/18 2205)  iopamidol (ISOVUE-370) 76 % injection 100 mL (100 mLs Intravenous Contrast Given 03/08/18 2153)     Initial Impression / Assessment and Plan / ED Course  I have reviewed the triage vital signs and the nursing notes.  Pertinent labs & imaging results that were available during my care of the patient were reviewed by me and considered in my medical decision making (see chart for details).    9:38 PM Patient improved following morphine, Toradol. D-dimer elevated.  10:49 PM On repeat exam the patient is awake and alert, hemodynamically unremarkable, feels better, looks better. We discussed  all findings including reassuring CT scan, reassuring labs, no evidence for ACS, PE, no evidence for pneumonia or other acute new pathology.  Along with his improvement here following anti-inflammatories, patient will start a course of similar medication, follow-up with primary care.   Final Clinical Impressions(s) / ED Diagnoses  Atypical chest pain   Carmin Muskrat, MD 03/08/18 2249

## 2018-03-08 NOTE — ED Triage Notes (Signed)
Patient here with chest pain that started this morning around 11a.  He states that he has had some tingling in the left arm that started around 10a.  He denies any shortness of breath, no nausea or vomiting.  Patient describes the pain as sharp in nature.

## 2018-03-08 NOTE — ED Notes (Signed)
Patient transported to CT 

## 2018-03-08 NOTE — ED Notes (Signed)
ED Provider at bedside. 

## 2018-03-08 NOTE — Discharge Instructions (Signed)
As discussed, your evaluation today has been largely reassuring.  But, it is important that you monitor your condition carefully, and do not hesitate to return to the ED if you develop new, or concerning changes in your condition. ? ?Otherwise, please follow-up with your physician for appropriate ongoing care. ? ?

## 2018-04-19 NOTE — Progress Notes (Signed)
[name] [address] [place], [place] [address] Phone: [phone] Subjective:    I [name] am serving as a Education administrator for Dr. Hulan Saas.    CC: Bilateral knee pain  SHF:[id number]  [name] is a 52 y.o. male coming in with complaint of knee pain. Knees are painful. Injected last visit.  Been nearly 6 months.  Worsening pain again.  Feels that the cold weather has started to cause increasing discomfort and pain.  Rates the severity of pain is 9 out of 10.  Patient has had viscosupplementation before.     Past Medical History:  Diagnosis Date  . Anxiety   . Arthritis    knees - no med  . Cluster headache    Hx - resolved per patient  . Depression   . DVT of lower extremity (deep venous thrombosis) ([facility]) 6/26-28/2013   Xarelto - resolved - history  . HTN (hypertension)    currently no meds  . Irritability and anger    PMH of  . Kidney stone    passed stone, no surgery required.  . Rotator cuff arthropathy 2007   right  . Saddle pulmonary embolus ([place]) 6/26-28/2013   post orthopedic surgery - resolved  . Sleep apnea 5/15   moderate OSA-has cpap but does not use it   Past Surgical History:  Procedure Laterality Date  . arthroscopic knee surgery  10/09/2011   Dr Ninfa Linden  . CARDIAC CATHETERIZATION  2007   LVdysfunction; normal coronaries  . KNEE ARTHROSCOPY WITH MEDIAL MENISECTOMY Left 09/20/2014   medial menisectomy chondroplastytella medial plica excision  ;  Surgeon: Dorna Leitz, MD;  Location: [facility];  Service: Orthopedics;  Laterality: Left;  . ROTATOR CUFF REPAIR  2007   right  . UPPER GASTROINTESTINAL ENDOSCOPY  2007   normal  . WISDOM TOOTH EXTRACTION     Social History   Socioeconomic History  . Marital status: Single    Spouse name: Not on file  . Number of children: 6  . Years of education: 67  . Highest education level: Not on file  Occupational History  . Occupation: Environmental education officer   . Occupation: Programmer, systems  Social Needs  . Financial resource strain: Not on file  . Food insecurity:    Worry: Not on file    Inability: Not on file  . Transportation needs:    Medical: Not on file    Non-medical: Not on file  Tobacco Use  . Smoking status: Former Smoker    Types: Cigars  . Smokeless tobacco: Never Used  . Tobacco comment: every 3-4 months  Substance and Sexual Activity  . Alcohol use: Yes    Alcohol/week: 0.0 standard drinks    Comment: drinks occasionally  . Drug use: No  . Sexual activity: Not on file  Lifestyle  . Physical activity:    Days per week: Not on file    Minutes per session: Not on file  . Stress: Not on file  Relationships  . Social connections:    Talks on phone: Not on file    Gets together: Not on file    Attends religious service: Not on file    Active member of club or organization: Not on file    Attends meetings of clubs or organizations: Not on file    Relationship status: Not on file  Other Topics Concern  . Not on file  Social History Narrative  Fun: Exercise   Allergies  Allergen Reactions  . Tramadol Other (See Comments)    insomnia   Family History  Problem Relation Age of Onset  . Cancer Mother        Mesothelioma   . Diabetes Brother   . Hypertension Brother   . Stroke Brother 48  . Heart attack Brother 32  . Diabetes Maternal Aunt   . Diabetes Maternal Grandmother   . Heart disease Maternal Grandmother   . Hypertension Maternal Grandmother   . Stroke Maternal Grandmother        in 46s  . Deep vein thrombosis Maternal Grandmother   . Stroke Maternal Grandfather        > 55  . Migraines Brother   . Heart attack Brother 71  . Colon cancer Neg Hx   . Esophageal cancer Neg Hx   . Rectal cancer Neg Hx   . Stomach cancer Neg Hx   . Liver cancer Neg Hx   . Pancreatic cancer Neg Hx      Current Outpatient Medications (Cardiovascular):  .  sildenafil (REVATIO) 20 MG tablet, TAKE THREE  TABLETS BY MOUTH DAILY AS NEEDED .  carvedilol (COREG) 3.125 MG tablet, Take 1 tablet (3.125 mg total) by mouth 2 (two) times daily.  Current Outpatient Medications (Respiratory):  .  albuterol (PROVENTIL HFA;VENTOLIN HFA) 108 (90 Base) MCG/ACT inhaler, Inhale 2 puffs into the lungs every 6 (six) hours as needed for wheezing or shortness of breath.    Current Outpatient Medications (Other):  [name]  Multiple Vitamin (MULTIVITAMIN WITH MINERALS) TABS tablet, Take 1 tablet by mouth daily. .  pantoprazole (PROTONIX) 40 MG tablet, Take 1 tablet (40 mg total) by mouth daily.    Past medical history, social, surgical and family history all reviewed in electronic medical record.  No pertanent information unless stated regarding to the chief complaint.   Review of Systems:  No headache, visual changes, nausea, vomiting, diarrhea, constipation, dizziness, abdominal pain, skin rash, fevers, chills, night sweats, weight loss, swollen lymph nodes, body aches, joint swelling, muscle aches, chest pain, shortness of breath, mood changes.   Objective  Blood pressure (!) 150/88, pulse 84, height 5\' 7"  (1.702 m), weight 263 lb (119.3 kg), SpO2 93 %.    General: No apparent distress alert and oriented x3 mood and affect normal, dressed appropriately.  HEENT: Pupils equal, extraocular movements intact  Respiratory: Patient's speak in full sentences and does not appear short of breath  Cardiovascular: No lower extremity edema, non tender, no erythema  Skin: Warm dry intact with no signs of infection or rash on extremities or on axial skeleton.  Abdomen: Soft nontender  Neuro: Cranial nerves II through XII are intact, neurovascularly intact in all extremities with 2+ DTRs and 2+ pulses.  Lymph: No lymphadenopathy of posterior or anterior cervical chain or axillae bilaterally.  Gait normal with good balance and coordination.  MSK:  Non tender with full range of motion and good stability and symmetric strength  and tone of shoulders, elbows, wrist, hip, and ankles bilaterally.  Knee: Bilateral valgus deformity noted. Large thigh to calf ratio.  Tender to palpation over medial and PF joint line.  ROM full in flexion and extension and lower leg rotation. instability with valgus force.  painful patellar compression. Patellar glide with moderate crepitus. Patellar and quadriceps tendons unremarkable. Hamstring and quadriceps strength is normal.  After informed written and verbal consent, patient was seated on exam table. Right knee was prepped with alcohol swab  and utilizing anterolateral approach, patient's right knee space was injected with 4:1  marcaine 0.5%: Kenalog 40mg /dL. Patient tolerated the procedure well without immediate complications.  Knee injection    Impression and Recommendations:     This case required medical decision making of moderate complexity. The above documentation has been reviewed and is accurate and complete Lyndal Pulley, DO       Note: This dictation was prepared with Dragon dictation along with smaller phrase technology. Any transcriptional errors that result from this process are unintentional.

## 2018-04-20 ENCOUNTER — Ambulatory Visit (INDEPENDENT_AMBULATORY_CARE_PROVIDER_SITE_OTHER): Payer: BLUE CROSS/BLUE SHIELD | Admitting: Family Medicine

## 2018-04-20 ENCOUNTER — Encounter: Payer: Self-pay | Admitting: Family Medicine

## 2018-04-20 DIAGNOSIS — M170 Bilateral primary osteoarthritis of knee: Secondary | ICD-10-CM | POA: Diagnosis not present

## 2018-04-20 NOTE — Patient Instructions (Addendum)
Good to see you  You know the drill  Ice is your friend Ice 20 minutes 2 times daily. Usually after activity and before bed. pennsaid pinkie amount topically 2 times daily as needed.  Can repeat injections every 3 months

## 2018-04-20 NOTE — Assessment & Plan Note (Signed)
Bilateral injections given today.  Discussed icing regimen and home exercise.  Discussed topical anti-inflammatories.  Patient wants to hold on any Visco supplementation.  Any repeating the injections we can repeat again in 3 months

## 2018-05-13 DIAGNOSIS — G4733 Obstructive sleep apnea (adult) (pediatric): Secondary | ICD-10-CM | POA: Diagnosis not present

## 2018-06-15 DIAGNOSIS — G4733 Obstructive sleep apnea (adult) (pediatric): Secondary | ICD-10-CM | POA: Diagnosis not present

## 2018-06-24 IMAGING — DX DG CHEST 2V
2 series · 2 of 2 positions shown · non-contrast
Comparison: 10/12/2015

CLINICAL DATA: Left leg swelling with shortness of breath

EXAM:
CHEST  2 VIEW

[w chest pa]
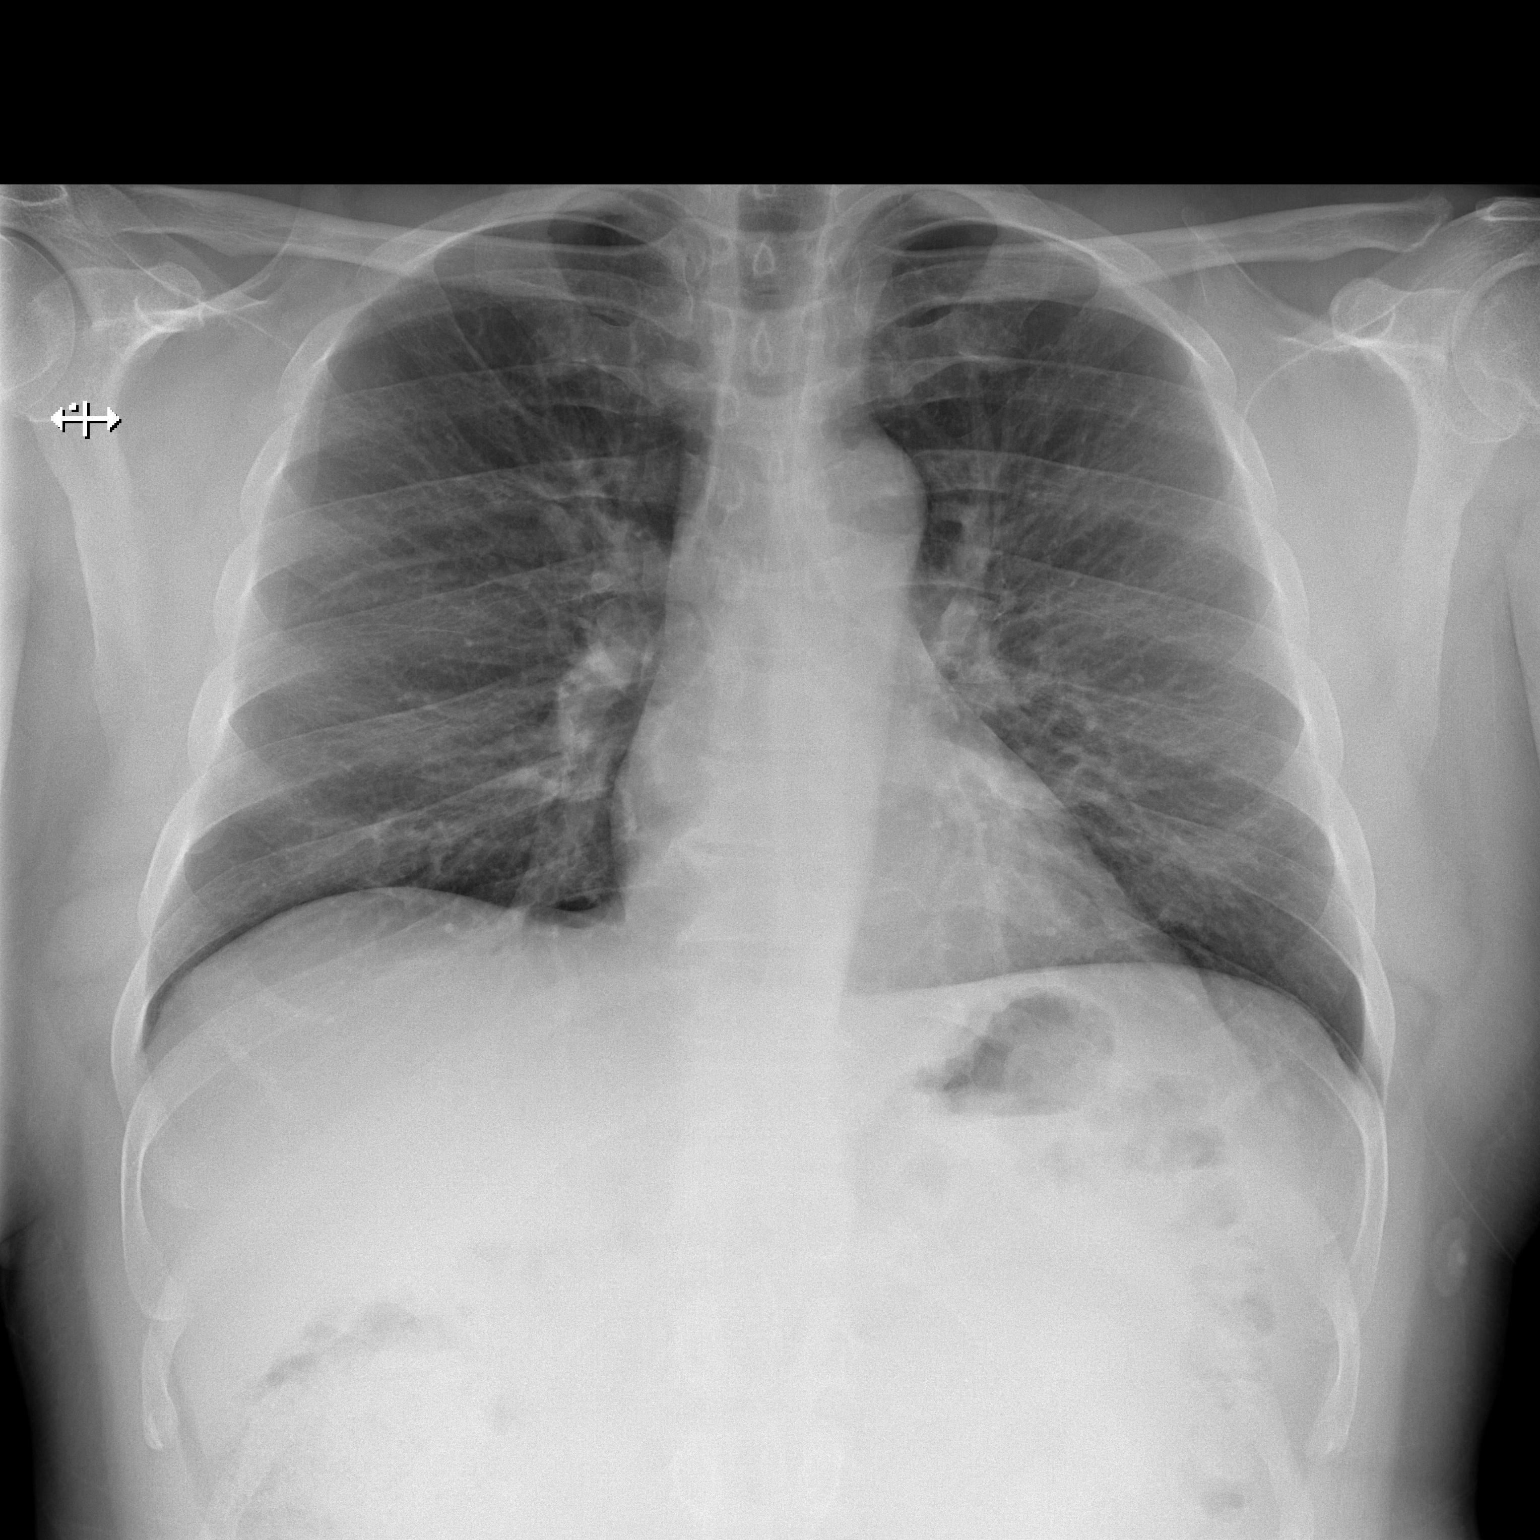

[w chest lat]
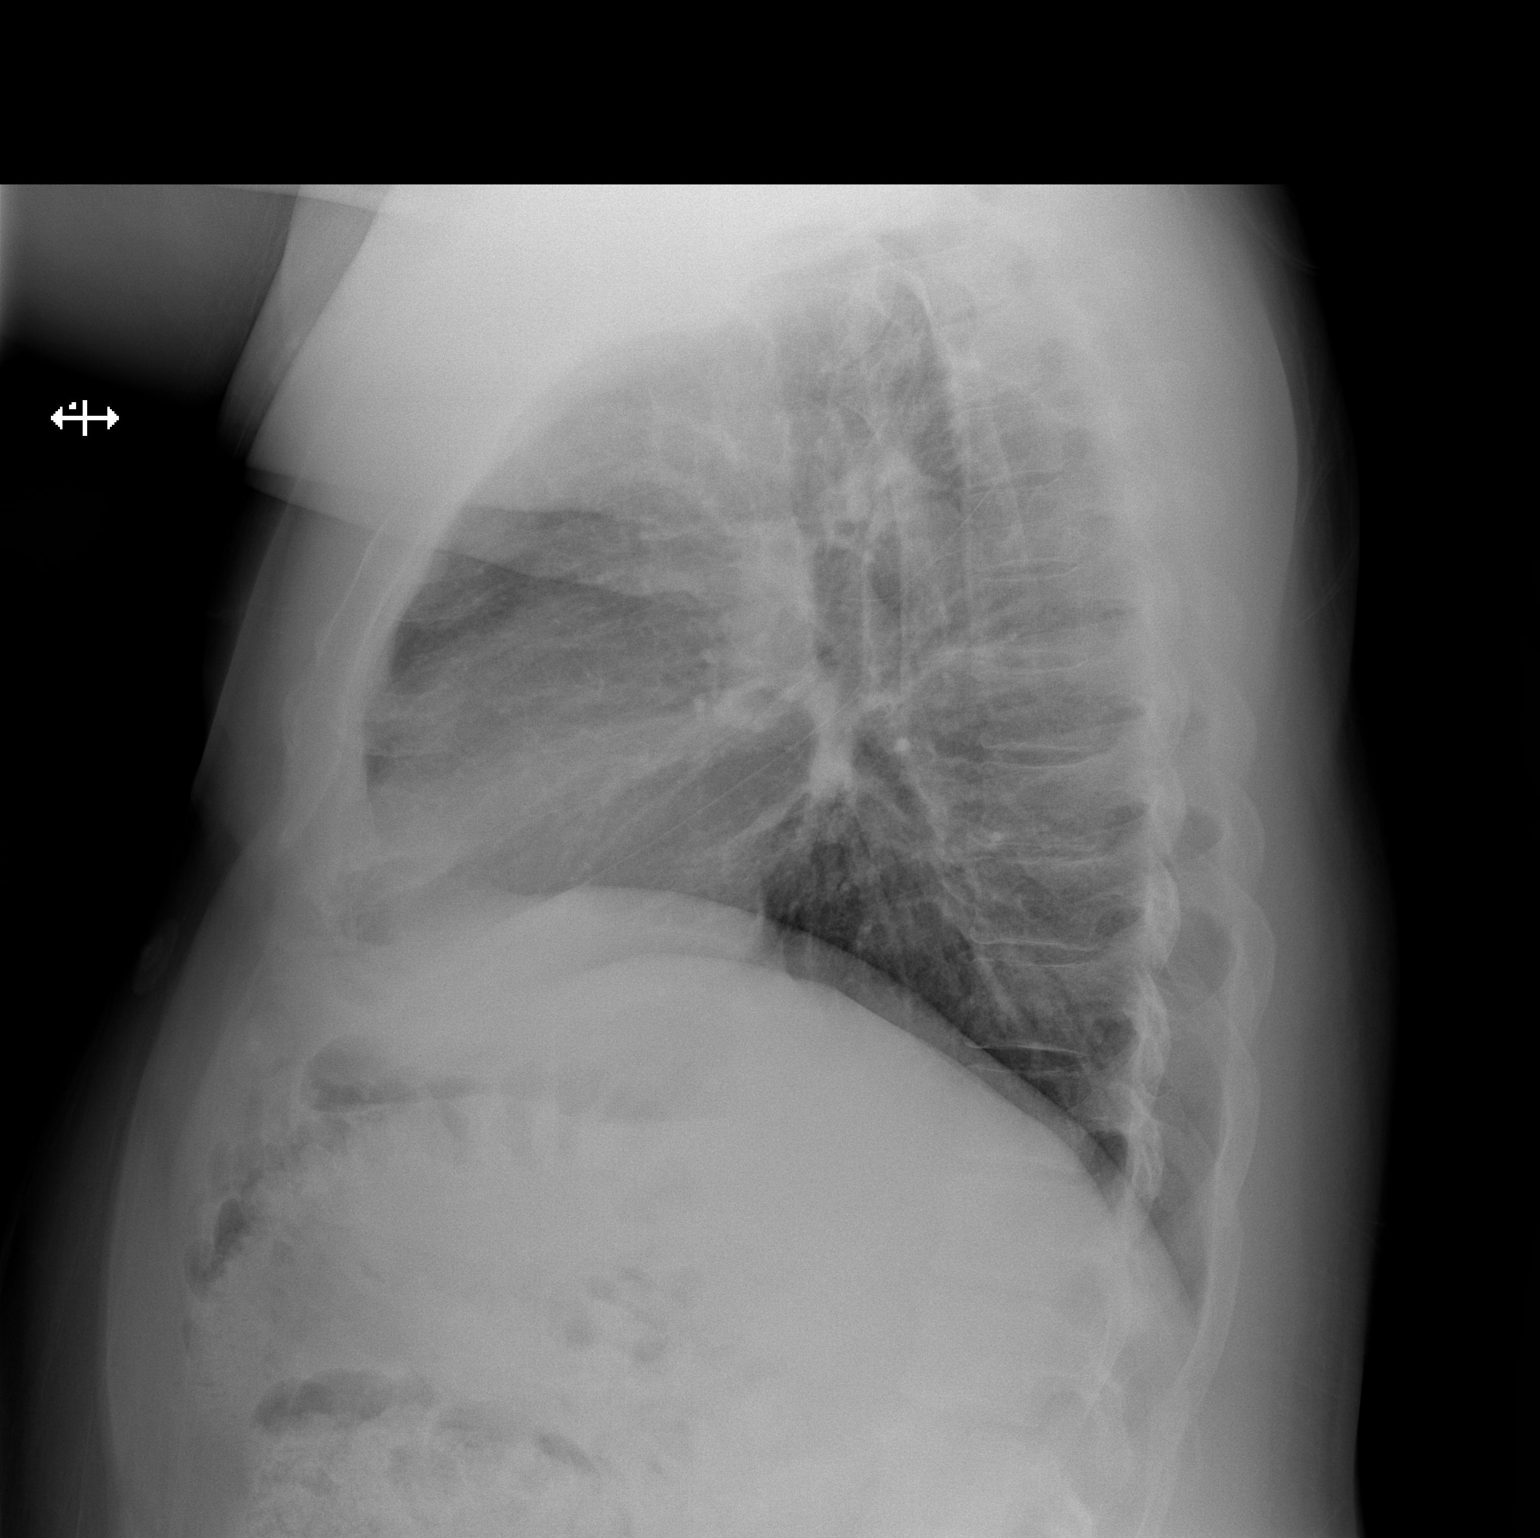

[2 of 2 positions shown; findings below may reference images not displayed]

FINDINGS: The heart size and mediastinal contours are within normal limits.
Both lungs are clear. The visualized skeletal structures are
unremarkable.
IMPRESSION: No active cardiopulmonary disease.

## 2018-07-16 DIAGNOSIS — G4733 Obstructive sleep apnea (adult) (pediatric): Secondary | ICD-10-CM | POA: Diagnosis not present

## 2018-07-29 ENCOUNTER — Other Ambulatory Visit: Payer: Self-pay | Admitting: Internal Medicine

## 2018-07-29 NOTE — Telephone Encounter (Signed)
Pt is due for follow up. Can you have him schedule?

## 2018-07-30 NOTE — Telephone Encounter (Signed)
Left message for patient to call back to schedule an appointment with Dr Ronnald Ramp (first available).

## 2018-08-14 DIAGNOSIS — G4733 Obstructive sleep apnea (adult) (pediatric): Secondary | ICD-10-CM | POA: Diagnosis not present

## 2018-08-14 NOTE — Progress Notes (Signed)
[name] [address] [place], [place] [address] Phone: [phone] Subjective:   I [name] am serving as a Education administrator for Dr. Hulan Saas.  I'm seeing this patient by the request  of:    CC: Bilateral knee pain  [id number]   04/20/2018 Bilateral injections given today.  Discussed icing regimen and home exercise.  Discussed topical anti-inflammatories.  Patient wants to hold on any Visco supplementation.  Any repeating the injections we can repeat again in 3 months  Updated 08/16/2018 [name] is a 53 y.o. male coming in with complaint of bilateral knee pain. Bilateral injections.  Last time patient was seen was in November.  Tolerated the procedure well.  Unable to get the Visco supplementation secondary to patient's insurance.  Increasing swelling, pain at night, as well as instability     Past Medical History:  Diagnosis Date  . Anxiety   . Arthritis    knees - no med  . Cluster headache    Hx - resolved per patient  . Depression   . DVT of lower extremity (deep venous thrombosis) ([facility]) 6/26-28/2013   Xarelto - resolved - history  . HTN (hypertension)    currently no meds  . Irritability and anger    PMH of  . Kidney stone    passed stone, no surgery required.  . Rotator cuff arthropathy 2007   right  . Saddle pulmonary embolus ([place] [facility]) 6/26-28/2013   post orthopedic surgery - resolved  . Sleep apnea 5/15   moderate OSA-has cpap but does not use it   Past Surgical History:  Procedure Laterality Date  . arthroscopic knee surgery  10/09/2011   Dr Ninfa Linden  . CARDIAC CATHETERIZATION  2007   LVdysfunction; normal coronaries  . KNEE ARTHROSCOPY WITH MEDIAL MENISECTOMY Left 09/20/2014   medial menisectomy chondroplastytella medial plica excision  ;  Surgeon: Dorna Leitz, MD;  Location: [facility];  Service: Orthopedics;  Laterality: Left;  . ROTATOR CUFF REPAIR  2007   right  . UPPER GASTROINTESTINAL  ENDOSCOPY  2007   normal  . WISDOM TOOTH EXTRACTION     Social History   Socioeconomic History  . Marital status: Single    Spouse name: Not on file  . Number of children: 6  . Years of education: 67  . Highest education level: Not on file  Occupational History  . Occupation: Glass blower/designer   . Occupation: Programmer, systems  Social Needs  . Financial resource strain: Not on file  . Food insecurity:    Worry: Not on file    Inability: Not on file  . Transportation needs:    Medical: Not on file    Non-medical: Not on file  Tobacco Use  . Smoking status: Former Smoker    Types: Cigars  . Smokeless tobacco: Never Used  . Tobacco comment: every 3-4 months  Substance and Sexual Activity  . Alcohol use: Yes    Alcohol/week: 0.0 standard drinks    Comment: drinks occasionally  . Drug use: No  . Sexual activity: Not on file  Lifestyle  . Physical activity:    Days per week: Not on file    Minutes per session: Not on file  . Stress: Not on file  Relationships  . Social connections:    Talks on phone: Not on file    Gets together: Not on file    Attends religious service: Not on file    Active member  of club or organization: Not on file    Attends meetings of clubs or organizations: Not on file    Relationship status: Not on file  Other Topics Concern  . Not on file  Social History Narrative   Fun: Exercise   Allergies  Allergen Reactions  . Tramadol Other (See Comments)    insomnia   Family History  Problem Relation Age of Onset  . Cancer Mother        Mesothelioma   . Diabetes Brother   . Hypertension Brother   . Stroke Brother 21  . Heart attack Brother 52  . Diabetes Maternal Aunt   . Diabetes Maternal Grandmother   . Heart disease Maternal Grandmother   . Hypertension Maternal Grandmother   . Stroke Maternal Grandmother        in 36s  . Deep vein thrombosis Maternal Grandmother   . Stroke Maternal Grandfather        > 55  . Migraines Brother   .  Heart attack Brother 50  . Colon cancer Neg Hx   . Esophageal cancer Neg Hx   . Rectal cancer Neg Hx   . Stomach cancer Neg Hx   . Liver cancer Neg Hx   . Pancreatic cancer Neg Hx      Current Outpatient Medications (Cardiovascular):  .  sildenafil (REVATIO) 20 MG tablet, TAKE THREE TABLETS BY MOUTH EVERY DAY AS NEEDED .  carvedilol (COREG) 3.125 MG tablet, Take 1 tablet (3.125 mg total) by mouth 2 (two) times daily.  Current Outpatient Medications (Respiratory):  .  albuterol (PROVENTIL HFA;VENTOLIN HFA) 108 (90 Base) MCG/ACT inhaler, Inhale 2 puffs into the lungs every 6 (six) hours as needed for wheezing or shortness of breath.    Current Outpatient Medications (Other):  [name]  Multiple Vitamin (MULTIVITAMIN WITH MINERALS) TABS tablet, Take 1 tablet by mouth daily. .  pantoprazole (PROTONIX) 40 MG tablet, Take 1 tablet (40 mg total) by mouth daily.    Past medical history, social, surgical and family history all reviewed in electronic medical record.  No pertanent information unless stated regarding to the chief complaint.   Review of Systems:  No headache, visual changes, nausea, vomiting, diarrhea, constipation, dizziness, abdominal pain, skin rash, fevers, chills, night sweats, weight loss, swollen lymph nodes, body aches, joint swelling,chest pain, shortness of breath, mood changes.  Positive muscle aches  Objective  Blood pressure (!) 144/80, pulse 88, height 5\' 7"  (1.702 m), weight 271 lb (122.9 kg), SpO2 97 %.    General: No apparent distress alert and oriented x3 mood and affect normal, dressed appropriately.  HEENT: Pupils equal, extraocular movements intact  Respiratory: Patient's speak in full sentences and does not appear short of breath  Cardiovascular: No lower extremity edema, non tender, no erythema  Skin: Warm dry intact with no signs of infection or rash on extremities or on axial skeleton.  Abdomen: Soft nontender  Neuro: Cranial nerves II through XII are  intact, neurovascularly intact in all extremities with 2+ DTRs and 2+ pulses.  Lymph: No lymphadenopathy of posterior or anterior cervical chain or axillae bilaterally.  Gait normal with good balance and coordination.  MSK:  Non tender with full range of motion and good stability and symmetric strength and tone of shoulders, elbows, wrist, hip, and ankles bilaterally.  Knee: Bilateral valgus deformity noted. Large thigh to calf ratio.  Tender to palpation over medial and PF joint line.  ROM full in flexion and extension and lower leg rotation. instability  with valgus force.  painful patellar compression. Patellar glide with moderate crepitus. Patellar and quadriceps tendons unremarkable. Hamstring and quadriceps strength is normal.     After informed written and verbal consent, patient was seated on exam table. Right knee was prepped with alcohol swab and utilizing anterolateral approach, patient's right knee space was injected with 4:1  marcaine 0.5%: Kenalog 40mg /dL. Patient tolerated the procedure well without immediate complications.   After informed written and verbal consent, patient was seated on exam table. Left knee was prepped with alcohol swab and utilizing anterolateral approach, patient's left knee space was injected with 4:1  marcaine 0.5%: Kenalog 40mg /dL. Patient tolerated the procedure well without immediate complications.   Impression and Recommendations:     This case required medical decision making of moderate complexity. The above documentation has been reviewed and is accurate and complete Lyndal Pulley, DO       Note: This dictation was prepared with Dragon dictation along with smaller phrase technology. Any transcriptional errors that result from this process are unintentional.

## 2018-08-16 ENCOUNTER — Encounter: Payer: Self-pay | Admitting: Family Medicine

## 2018-08-16 ENCOUNTER — Other Ambulatory Visit: Payer: Self-pay

## 2018-08-16 ENCOUNTER — Ambulatory Visit (INDEPENDENT_AMBULATORY_CARE_PROVIDER_SITE_OTHER): Payer: BLUE CROSS/BLUE SHIELD | Admitting: Family Medicine

## 2018-08-16 DIAGNOSIS — Q72893 Other reduction defects of lower limb, bilateral: Secondary | ICD-10-CM | POA: Diagnosis not present

## 2018-08-16 DIAGNOSIS — M170 Bilateral primary osteoarthritis of knee: Secondary | ICD-10-CM | POA: Diagnosis not present

## 2018-08-16 NOTE — Patient Instructions (Signed)
Good to see you  Ice 20 minutes 2 times daily. Usually after activity and before bed. pennsaid pinkie amount topically 2 times daily as needed.  Injected the knees again can do every 10 weeks See me again in 10 weeks

## 2018-08-16 NOTE — Assessment & Plan Note (Signed)
Injection given today.  Tolerated the procedure well.  Patient's covered viscosupplementation in the.  Encouraged him to do more the bracing.  With patient's breakdown of the longitudinal arch will be fitted phonics that could help with some alignment.  Discussed icing regimen.  Follow-up again in 10 weeks otherwise

## 2018-08-16 NOTE — Assessment & Plan Note (Signed)
Bilateral longitudinal breakdown.  This can causing more increased discomfort of the knee secondary to alignment.  Patient will be fitted for custom orthotics to try to support this and hopefully help with some alignment.  Discussed icing regimen and home exercises.  Follow-up again 10 weeks after getting these.

## 2018-08-22 NOTE — Progress Notes (Signed)
Procedure Note   Patient was fitted for a : standard, cushioned, semi-rigid orthotic. The orthotic was heated and afterward the patient patient seated position and molded The patient was positioned in subtalar neutral position and 10 degrees of ankle dorsiflexion in a weight bearing stance. After completion of molding, patient did have orthotic management The blank was ground to a stable position for weight bearing. Size: 10.5 Base: Carbon fiber Additional Posting and Padding: left and right medial 300/110 270/90, lateral 250/35, transverse 200/40 lateral right 200/40 The patient ambulated these, and they were very comfortable.

## 2018-08-23 ENCOUNTER — Ambulatory Visit (INDEPENDENT_AMBULATORY_CARE_PROVIDER_SITE_OTHER): Payer: BLUE CROSS/BLUE SHIELD | Admitting: Family Medicine

## 2018-08-23 ENCOUNTER — Other Ambulatory Visit: Payer: Self-pay

## 2018-08-23 DIAGNOSIS — Q72893 Other reduction defects of lower limb, bilateral: Secondary | ICD-10-CM | POA: Diagnosis not present

## 2018-08-23 NOTE — Assessment & Plan Note (Signed)
Orthotics made today.  Discussed increasing wear slowly over the course the next several weeks.  Discussed icing regimen as well.  Discussed the importance of weight loss.  Follow-up again in 4 to 8 weeks

## 2018-08-30 ENCOUNTER — Ambulatory Visit: Payer: BLUE CROSS/BLUE SHIELD | Admitting: Internal Medicine

## 2018-09-14 DIAGNOSIS — G4733 Obstructive sleep apnea (adult) (pediatric): Secondary | ICD-10-CM | POA: Diagnosis not present

## 2018-09-24 ENCOUNTER — Telehealth: Payer: Self-pay | Admitting: Pulmonary Disease

## 2018-09-24 NOTE — Telephone Encounter (Signed)
Pt returned triage's call.

## 2018-09-24 NOTE — Telephone Encounter (Signed)
Called and spoke with Patient. Kenney Houseman, NP recommendations given.  Patient stated understanding. Patient is scheduled with tele visit Monday at 4pm with Tonya for cpap follow up.  Patient has been having issues with leaking and from mask. Nothing further at this time.

## 2018-09-24 NOTE — Telephone Encounter (Signed)
Spoke with the patient and he stated that although he is using the machine and sleeping 5 - 7 hours a night he still wakes up tired. He said when he is in the car he can fall asleep.  Patient uses a nasal pillow and has felt air leaking from it. Has adjusted the straps which have been loose in the past.  Message above forwarded back to Lazaro Arms, FNP-C to review.  Tonya, please see above, if the settings can be changed, can we also request for the straps to the mask to be replaced as well?

## 2018-09-24 NOTE — Telephone Encounter (Signed)
Please call patient and get a little more detail on issue with machine. Thanks.

## 2018-09-24 NOTE — Telephone Encounter (Signed)
Patient last seen 12/25/17 by Dr. Elsworth Soho.   [facility] report printed. Message forwarded to App of the day - Lazaro Arms, FNP-C  Tonya: I printed the [name] report for this patient. Shows 80% usage for use greater than 4 hours a night. At 90% total usage out of 20 day usage. He is auto set 5 - 15.   Please advise what you recommend on changes.

## 2018-09-24 NOTE — Telephone Encounter (Signed)
LVM for patient to call back to explain why he feels the pressure needs to be increased so that appropriate can be placed.

## 2018-09-24 NOTE — Telephone Encounter (Signed)
Please place patient on app schedule for tele-visit on Monday. Thanks.

## 2018-09-27 ENCOUNTER — Ambulatory Visit (INDEPENDENT_AMBULATORY_CARE_PROVIDER_SITE_OTHER): Payer: BLUE CROSS/BLUE SHIELD | Admitting: Nurse Practitioner

## 2018-09-27 ENCOUNTER — Other Ambulatory Visit: Payer: Self-pay

## 2018-09-27 ENCOUNTER — Encounter: Payer: Self-pay | Admitting: Nurse Practitioner

## 2018-09-27 DIAGNOSIS — G4733 Obstructive sleep apnea (adult) (pediatric): Secondary | ICD-10-CM

## 2018-09-27 NOTE — Assessment & Plan Note (Signed)
Patient is compliant with CPAP. Patient states that he is having issues with his CPAP machine.  He feels like the pressure needs to be adjusted.  He states it takes too long for the pressure to build up.  He is also having issues with his headgear.  He states that his straps are too loose in the nasal pillow mask falls down from his nose occasionally.   Patient Instructions  Patient continues to benefit from CPAP with good compliance and control documented Will increase CPAP settings to AutoSet 7-20 cmH20 Will order new head gear/straps Continue current medications Goal of 4 hours or more usage per night Maintain healthy weight Do not drive if drowsy  Follow up with Dr. Elsworth Soho in 6 months or sooner if needed

## 2018-09-27 NOTE — Patient Instructions (Addendum)
Patient continues to benefit from CPAP with good compliance and control documented Will increase CPAP settings to AutoSet 7-20 cmH20 Will order new head gear/straps Continue current medications Goal of 4 hours or more usage per night Maintain healthy weight Do not drive if drowsy  Follow up with Dr. Elsworth Soho in 6 months or sooner if needed

## 2018-09-27 NOTE — Progress Notes (Signed)
Virtual Visit via Telephone Note  I connected with [name] on 09/27/18 at  4:00 PM EDT by telephone and verified that I am speaking with the correct person using two identifiers.   I discussed the limitations, risks, security and privacy concerns of performing an evaluation and management service by telephone and the availability of in person appointments. I also discussed with the patient that there may be a patient responsible charge related to this service. The patient expressed understanding and agreed to proceed.   History of Present Illness: 53 year old male with OSA who is followed by Dr. Elsworth Soho.  Patient has a tele-visit today for follow-up on OSA.  Patient states that he is having issues with his CPAP machine.  He feels like the pressure needs to be adjusted.  He states it takes too long for the pressure to build up.  He is also having issues with his headgear.  He states that his straps are too loose in the nasal pillow mask falls down from his nose occasionally.  He does try to wear the CPAP nightly and is compliant.  He states that he does benefit from use during the days less drowsy throughout the day.  His DME company is [employer]. Denies f/c/s, n/v/d, hemoptysis, PND, leg swelling.   Observations/Objective: HST 09/2013: 24/hr PFTs 01/2017 normal 07/2017 CT angiogram normal appearance of lungs  Assessment and Plan: Patient is compliant with CPAP. Patient states that he is having issues with his CPAP machine.  He feels like the pressure needs to be adjusted.  He states it takes too long for the pressure to build up.  He is also having issues with his headgear.  He states that his straps are too loose in the nasal pillow mask falls down from his nose occasionally.   Patient Instructions  Patient continues to benefit from CPAP with good compliance and control documented Will increase CPAP settings to AutoSet 7-20 cmH20 Will order new head gear/straps Continue current  medications Goal of 4 hours or more usage per night Maintain healthy weight Do not drive if drowsy  Follow Up Instructions:  Follow up with Dr. Elsworth Soho in 6 months or sooner if needed   I discussed the assessment and treatment plan with the patient. The patient was provided an opportunity to ask questions and all were answered. The patient agreed with the plan and demonstrated an understanding of the instructions.   The patient was advised to call back or seek an in-person evaluation if the symptoms worsen or if the condition fails to improve as anticipated.  I provided 23 minutes of non-face-to-face time during this encounter.   Fenton Foy, NP

## 2018-09-28 NOTE — Progress Notes (Signed)
Pl review CPAP download & consider changing to fixed pressure setting or tighten range

## 2018-10-14 DIAGNOSIS — G4733 Obstructive sleep apnea (adult) (pediatric): Secondary | ICD-10-CM | POA: Diagnosis not present

## 2018-10-25 NOTE — Progress Notes (Deleted)
[name] [address], [place] [address] Phone: [phone] Subjective:    I'm seeing this patient by the request  of:    CC: bilateral knee pain   [id number]  [name] is a 53 y.o. male coming in with complaint of ***  Onset-  Location Duration-  Character- Aggravating factors- Reliving factors-  Therapies tried-  Severity-     Past Medical History:  Diagnosis Date  . Anxiety   . Arthritis    knees - no med  . Cluster headache    Hx - resolved per patient  . Depression   . DVT of lower extremity (deep venous thrombosis) ([facility]) 6/26-28/2013   Xarelto - resolved - history  . HTN (hypertension)    currently no meds  . Irritability and anger    PMH of  . Kidney stone    passed stone, no surgery required.  . Rotator cuff arthropathy 2007   right  . Saddle pulmonary embolus ([facility]) 6/26-28/2013   post orthopedic surgery - resolved  . Sleep apnea 5/15   moderate OSA-has cpap but does not use it   Past Surgical History:  Procedure Laterality Date  . arthroscopic knee surgery  10/09/2011   Dr Ninfa Linden  . CARDIAC CATHETERIZATION  2007   LVdysfunction; normal coronaries  . KNEE ARTHROSCOPY WITH MEDIAL MENISECTOMY Left 09/20/2014   medial menisectomy chondroplastytella medial plica excision  ;  Surgeon: Dorna Leitz, MD;  Location: [facility];  Service: Orthopedics;  Laterality: Left;  . ROTATOR CUFF REPAIR  2007   right  . UPPER GASTROINTESTINAL ENDOSCOPY  2007   normal  . WISDOM TOOTH EXTRACTION     Social History   Socioeconomic History  . Marital status: Single    Spouse name: Not on file  . Number of children: 6  . Years of education: 32  . Highest education level: Not on file  Occupational History  . Occupation: Glass blower/designer   . Occupation: Programmer, systems  Social Needs  . Financial resource strain: Not on file  . Food insecurity:    Worry: Not on file    Inability: Not on file   . Transportation needs:    Medical: Not on file    Non-medical: Not on file  Tobacco Use  . Smoking status: Former Smoker    Types: Cigars  . Smokeless tobacco: Never Used  . Tobacco comment: every 3-4 months  Substance and Sexual Activity  . Alcohol use: Yes    Alcohol/week: 0.0 standard drinks    Comment: drinks occasionally  . Drug use: No  . Sexual activity: Not on file  Lifestyle  . Physical activity:    Days per week: Not on file    Minutes per session: Not on file  . Stress: Not on file  Relationships  . Social connections:    Talks on phone: Not on file    Gets together: Not on file    Attends religious service: Not on file    Active member of club or organization: Not on file    Attends meetings of clubs or organizations: Not on file    Relationship status: Not on file  Other Topics Concern  . Not on file  Social History Narrative   Fun: Exercise   Allergies  Allergen Reactions  . Tramadol Other (See Comments)    insomnia   Family History  Problem Relation Age of Onset  . Cancer Mother  Mesothelioma   . Diabetes Brother   . Hypertension Brother   . Stroke Brother 71  . Heart attack Brother 6  . Diabetes Maternal Aunt   . Diabetes Maternal Grandmother   . Heart disease Maternal Grandmother   . Hypertension Maternal Grandmother   . Stroke Maternal Grandmother        in 58s  . Deep vein thrombosis Maternal Grandmother   . Stroke Maternal Grandfather        > 55  . Migraines Brother   . Heart attack Brother 77  . Colon cancer Neg Hx   . Esophageal cancer Neg Hx   . Rectal cancer Neg Hx   . Stomach cancer Neg Hx   . Liver cancer Neg Hx   . Pancreatic cancer Neg Hx      Current Outpatient Medications (Cardiovascular):  .  carvedilol (COREG) 3.125 MG tablet, Take 1 tablet (3.125 mg total) by mouth 2 (two) times daily. .  sildenafil (REVATIO) 20 MG tablet, TAKE THREE TABLETS BY MOUTH EVERY DAY AS NEEDED  Current Outpatient Medications  (Respiratory):  .  albuterol (PROVENTIL HFA;VENTOLIN HFA) 108 (90 Base) MCG/ACT inhaler, Inhale 2 puffs into the lungs every 6 (six) hours as needed for wheezing or shortness of breath.    Current Outpatient Medications (Other):  [name]  Multiple Vitamin (MULTIVITAMIN WITH MINERALS) TABS tablet, Take 1 tablet by mouth daily. .  pantoprazole (PROTONIX) 40 MG tablet, Take 1 tablet (40 mg total) by mouth daily.    Past medical history, social, surgical and family history all reviewed in electronic medical record.  No pertanent information unless stated regarding to the chief complaint.   Review of Systems:  No headache, visual changes, nausea, vomiting, diarrhea, constipation, dizziness, abdominal pain, skin rash, fevers, chills, night sweats, weight loss, swollen lymph nodes, body aches, joint swelling, muscle aches, chest pain, shortness of breath, mood changes.   Objective  There were no vitals taken for this visit. Systems examined below as of    General: No apparent distress alert and oriented x3 mood and affect normal, dressed appropriately.  HEENT: Pupils equal, extraocular movements intact  Respiratory: Patient's speak in full sentences and does not appear short of breath  Cardiovascular: No lower extremity edema, non tender, no erythema  Skin: Warm dry intact with no signs of infection or rash on extremities or on axial skeleton.  Abdomen: Soft nontender  Neuro: Cranial nerves II through XII are intact, neurovascularly intact in all extremities with 2+ DTRs and 2+ pulses.  Lymph: No lymphadenopathy of posterior or anterior cervical chain or axillae bilaterally.  Gait normal with good balance and coordination.  MSK:  Non tender with full range of motion and good stability and symmetric strength and tone of shoulders, elbows, wrist, hip and ankles bilaterally.  Knee: valgus deformity noted. Large thigh to calf ratio.  Tender to palpation over medial and PF joint line.  ROM full in  flexion and extension and lower leg rotation. instability with valgus force.  painful patellar compression. Patellar glide with moderate crepitus. Patellar and quadriceps tendons unremarkable. Hamstring and quadriceps strength is normal.     Impression and Recommendations:     This case required medical decision making of moderate complexity. The above documentation has been reviewed and is accurate and complete Lyndal Pulley, DO       Note: This dictation was prepared with Dragon dictation along with smaller phrase technology. Any transcriptional errors that result from this process are unintentional.

## 2018-10-26 ENCOUNTER — Ambulatory Visit: Payer: Self-pay | Admitting: *Deleted

## 2018-10-26 ENCOUNTER — Ambulatory Visit: Payer: BLUE CROSS/BLUE SHIELD | Admitting: Family Medicine

## 2018-10-26 NOTE — Telephone Encounter (Signed)
Pt reports swelling of both legs x 1 month. "Comes and goes". States both calves and ankles "Sometines the feet." States CP when swelling occurs, mild, along with SOB. States swelling is pitting at times. Presently mild swelling and denies CP, no SOB. States legs are red at times, no warmth, not presently. Pt initially calling to set up earlier appt; states was scheduled today for Monday 11/01/2018. Advised practice was closed at this time. Advised to go to ED if CP reoccurs, any SOB or increased swelling. States will follow disposition. Advised practice will CB to reschedule if appropriate. Reiterated again need for ED eval if symptoms reoccur. Pt verbalizes understanding.   CB#  [phone]  Reason for Disposition . [1] MODERATE leg swelling (e.g., swelling extends up to knees) AND [2] new onset or worsening  Answer Assessment - Initial Assessment Questions 1. ONSET: "When did the swelling start?" (e.g., minutes, hours, days)     1 month 2. LOCATION: "What part of the leg is swollen?"  "Are both legs swollen or just one leg?"     Both legs, calves and ankles, feet at times 3. SEVERITY: "How bad is the swelling?" (e.g., localized; mild, moderate, severe)  - Localized - small area of swelling localized to one leg  - MILD pedal edema - swelling limited to foot and ankle, pitting edema < 1/4 inch (6 mm) deep, rest and elevation eliminate most or all swelling  - MODERATE edema - swelling of lower leg to knee, pitting edema > 1/4 inch (6 mm) deep, rest and elevation only partially reduce swelling  - SEVERE edema - swelling extends above knee, facial or hand swelling present      Pitting, moderate at times 4. REDNESS: "Does the swelling look red or infected?"     Red at times 5. PAIN: "Is the swelling painful to touch?" If so, ask: "How painful is it?"   (Scale 1-10; mild, moderate or severe)     "If tight" 6. FEVER: "Do you have a fever?" If so, ask: "What is it, how was it measured, and when did it  start?"      no 7. CAUSE: "What do you think is causing the leg swelling?"     "blood clots before" 8. MEDICAL HISTORY: "Do you have a history of heart failure, kidney disease, liver failure, or cancer?"     *No Answer* 9. RECURRENT SYMPTOM: "Have you had leg swelling before?" If so, ask: "When was the last time?" "What happened that time?"    yes 10. OTHER SYMPTOMS: "Do you have any other symptoms?" (e.g., chest pain, difficulty breathing)      CP , when legs swell,  intermittent  Protocols used: LEG SWELLING AND EDEMA-A-AH

## 2018-11-01 ENCOUNTER — Ambulatory Visit (INDEPENDENT_AMBULATORY_CARE_PROVIDER_SITE_OTHER): Payer: BLUE CROSS/BLUE SHIELD | Admitting: Internal Medicine

## 2018-11-01 ENCOUNTER — Encounter: Payer: Self-pay | Admitting: Internal Medicine

## 2018-11-01 ENCOUNTER — Other Ambulatory Visit: Payer: Self-pay

## 2018-11-01 VITALS — BP 144/96 | HR 83 | Temp 98.2°F | Resp 16 | Ht 67.0 in | Wt 273.0 lb

## 2018-11-01 DIAGNOSIS — R0600 Dyspnea, unspecified: Secondary | ICD-10-CM

## 2018-11-01 DIAGNOSIS — R739 Hyperglycemia, unspecified: Secondary | ICD-10-CM | POA: Diagnosis not present

## 2018-11-01 DIAGNOSIS — D6851 Activated protein C resistance: Secondary | ICD-10-CM | POA: Diagnosis not present

## 2018-11-01 DIAGNOSIS — R0789 Other chest pain: Secondary | ICD-10-CM

## 2018-11-01 DIAGNOSIS — R0609 Other forms of dyspnea: Secondary | ICD-10-CM

## 2018-11-01 DIAGNOSIS — K2100 Gastro-esophageal reflux disease with esophagitis, without bleeding: Secondary | ICD-10-CM

## 2018-11-01 DIAGNOSIS — Z0000 Encounter for general adult medical examination without abnormal findings: Secondary | ICD-10-CM | POA: Diagnosis not present

## 2018-11-01 DIAGNOSIS — E559 Vitamin D deficiency, unspecified: Secondary | ICD-10-CM

## 2018-11-01 DIAGNOSIS — I10 Essential (primary) hypertension: Secondary | ICD-10-CM

## 2018-11-01 MED ORDER — PANTOPRAZOLE SODIUM 40 MG PO TBEC: 40.0000 mg | DELAYED_RELEASE_TABLET | Freq: Every day | ORAL | 1 refills | Status: DC

## 2018-11-01 NOTE — Patient Instructions (Signed)

## 2018-11-01 NOTE — Progress Notes (Signed)
Subjective:  Patient ID: [name], male    DOB: 09/14/1965  Age: 53 y.o. MRN: [id number]  CC: Annual Exam and Hypertension   HPI [name] presents for a CPX.  He complains of a 2 to 75-month history of chest pain that he describes as diffuse pressure over his sternum that radiates into both upper extremities.  He has also developed mild dyspnea on exertion.  He denies diaphoresis, dizziness, lightheadedness, or near syncope.  Outpatient Medications Prior to Visit  Medication Sig Dispense Refill   albuterol (PROVENTIL HFA;VENTOLIN HFA) 108 (90 Base) MCG/ACT inhaler Inhale 2 puffs into the lungs every 6 (six) hours as needed for wheezing or shortness of breath. 1 Inhaler 6   Multiple Vitamin (MULTIVITAMIN WITH MINERALS) TABS tablet Take 1 tablet by mouth daily.     sildenafil (REVATIO) 20 MG tablet TAKE THREE TABLETS BY MOUTH EVERY DAY AS NEEDED 50 tablet 5   pantoprazole (PROTONIX) 40 MG tablet Take 1 tablet (40 mg total) by mouth daily. 90 tablet 1   carvedilol (COREG) 3.125 MG tablet Take 1 tablet (3.125 mg total) by mouth 2 (two) times daily. 180 tablet 3   No facility-administered medications prior to visit.     ROS Review of Systems  Constitutional: Negative.  Negative for diaphoresis, fatigue and unexpected weight change.  HENT: Negative.  Negative for trouble swallowing.   Eyes: Negative for visual disturbance.  Respiratory: Positive for shortness of breath. Negative for cough, chest tightness and wheezing.   Cardiovascular: Positive for chest pain. Negative for palpitations and leg swelling.  Gastrointestinal: Negative for abdominal pain, blood in stool, constipation, diarrhea, nausea and vomiting.  Endocrine: Positive for polyuria. Negative for polydipsia and polyphagia.  Genitourinary: Positive for frequency. Negative for difficulty urinating, genital sores, penile swelling, scrotal swelling, testicular pain and urgency.  Musculoskeletal: Positive for  arthralgias. Negative for back pain and myalgias.  Skin: Negative.  Negative for color change, pallor and rash.  Neurological: Negative.  Negative for dizziness, weakness, light-headedness and headaches.  Hematological: Negative for adenopathy. Does not bruise/bleed easily.  Psychiatric/Behavioral: Negative.     Objective:  BP (!) 144/96 (BP Location: Left Arm, Patient Position: Sitting, Cuff Size: Large)    Pulse 83    Temp 98.2 F (36.8 C) (Oral)    Resp 16    Ht 5\' 7"  (1.702 m)    Wt 273 lb (123.8 kg)    SpO2 91%    BMI 42.76 kg/m   BP Readings from Last 3 Encounters:  11/01/18 (!) 144/96  08/16/18 (!) 144/80  04/20/18 (!) 150/88    Wt Readings from Last 3 Encounters:  11/01/18 273 lb (123.8 kg)  08/16/18 271 lb (122.9 kg)  04/20/18 263 lb (119.3 kg)    Physical Exam Constitutional:      Appearance: He is obese. He is not ill-appearing or diaphoretic.  HENT:     Nose: Nose normal. No congestion or rhinorrhea.     Mouth/Throat:     Mouth: Mucous membranes are moist.     Pharynx: No oropharyngeal exudate.  Eyes:     General: No scleral icterus.    Conjunctiva/sclera: Conjunctivae normal.  Neck:     Musculoskeletal: Normal range of motion and neck supple. No neck rigidity.  Cardiovascular:     Rate and Rhythm: Normal rate and regular rhythm.     Pulses:          Carotid pulses are 1+ on the right side and 1+ on  the left side.      Radial pulses are 1+ on the right side and 1+ on the left side.       Femoral pulses are 1+ on the right side and 1+ on the left side.      Popliteal pulses are 1+ on the right side and 1+ on the left side.       Dorsalis pedis pulses are 1+ on the right side and 1+ on the left side.       Posterior tibial pulses are 1+ on the right side and 1+ on the left side.     Heart sounds: Normal heart sounds, S1 normal and S2 normal. No murmur. No gallop.      Comments: EKG ----  Sinus  Rhythm  -RSR(V1) -nondiagnostic.   PROBABLY  NORMAL Pulmonary:     Breath sounds: No stridor. No wheezing, rhonchi or rales.  Abdominal:     General: Abdomen is protuberant. Bowel sounds are normal.     Palpations: Abdomen is soft. There is no hepatomegaly or splenomegaly.     Tenderness: There is no abdominal tenderness.     Hernia: No hernia is present. There is no hernia in the right inguinal area or left inguinal area.  Genitourinary:    Pubic Area: No rash.      Penis: Normal and circumcised. No swelling or lesions.      Scrotum/Testes: Normal.        Right: Mass, tenderness or swelling not present.        Left: Mass, tenderness or swelling not present.     Epididymis:     Right: Normal. Not inflamed or enlarged. No mass.     Left: Not inflamed or enlarged. No mass.     Prostate: Normal. Not enlarged, not tender and no nodules present.     Rectum: Normal. Guaiac result negative. No mass, tenderness, anal fissure, external hemorrhoid or internal hemorrhoid. Normal anal tone.  Musculoskeletal: Normal range of motion.     Right lower leg: No edema.     Left lower leg: No edema.  Lymphadenopathy:     Cervical: No cervical adenopathy.     Lower Body: No right inguinal adenopathy. No left inguinal adenopathy.  Skin:    General: Skin is warm and dry.     Coloration: Skin is not pale.  Neurological:     General: No focal deficit present.     Mental Status: He is alert. Mental status is at baseline.  Psychiatric:        Mood and Affect: Mood normal.        Behavior: Behavior normal.     Lab Results  Component Value Date   WBC 10.4 11/02/2018   HGB 14.4 11/02/2018   HCT 42.3 11/02/2018   PLT 243.0 11/02/2018   GLUCOSE 108 (H) 11/02/2018   CHOL 154 11/02/2018   TRIG 84.0 11/02/2018   HDL 49.10 11/02/2018   LDLCALC 88 11/02/2018   ALT 22 11/02/2018   AST 17 11/02/2018   NA 138 11/02/2018   K 3.8 11/02/2018   CL 106 11/02/2018   CREATININE 0.83 11/02/2018   BUN 15 11/02/2018   CO2 25 11/02/2018   TSH 1.36  11/02/2018   PSA 0.37 11/02/2018   INR CANCELED 08/12/2013   HGBA1C 6.4 11/02/2018    Dg Chest 2 View  Result Date: 03/08/2018 CLINICAL DATA:  Chest pain since this morning at 11 a.m. Left arm tingling sensations since 10 a.m. Ex-smoker.  EXAM: CHEST - 2 VIEW COMPARISON:  08/24/2017 and chest CTA dated 08/19/2017. FINDINGS: Poor inspiration. Normal sized heart. Clear lungs with normal vascularity. Stable mild diffuse peribronchial thickening and accentuation of the interstitial markings. Lower thoracic spine degenerative changes. IMPRESSION: No acute abnormality.  Stable mild chronic bronchitic changes. Electronically Signed   By: Claudie Revering M.D.   On: 03/08/2018 20:44   Ct Angio Chest Pe W/cm &/or Wo Cm  Result Date: 03/08/2018 CLINICAL DATA:  Chest pain for several hours EXAM: CT ANGIOGRAPHY CHEST WITH CONTRAST TECHNIQUE: Multidetector CT imaging of the chest was performed using the standard protocol during bolus administration of intravenous contrast. Multiplanar CT image reconstructions and MIPs were obtained to evaluate the vascular anatomy. CONTRAST:  100 mL ISOVUE-370 IOPAMIDOL (ISOVUE-370) INJECTION 76% COMPARISON:  Chest x-ray from earlier in the same day, ultrasound from 01/23/2017 and MRI from 03/29/2017. FINDINGS: Cardiovascular: Thoracic aorta demonstrates no significant atherosclerotic calcifications. Mild coronary calcifications are seen. No cardiac enlargement is noted. The pulmonary artery demonstrates a normal branching pattern. No filling defects to suggest pulmonary emboli are identified. Mediastinum/Nodes: Thoracic inlet is within normal limits. No sizable hilar or mediastinal adenopathy is noted. The esophagus is within normal limits as visualized. Lungs/Pleura: Mild dependent atelectatic changes are noted. No focal confluent infiltrate or sizable effusion is seen. Upper Abdomen: Hypodense lesion is noted in the upper pole of the right kidney medially. Musculoskeletal: Degenerative  changes of thoracic spine are noted. Review of the MIP images confirms the above findings. IMPRESSION: No evidence of pulmonary emboli. Hypodense lesion in the right kidney stable over multiple previous exams consistent with a benign cyst. Electronically Signed   By: Inez Catalina M.D.   On: 03/08/2018 22:11    Assessment & Plan:   Dameir was seen today for annual exam and hypertension.  Diagnoses and all orders for this visit:  Essential hypertension- His blood pressure is not adequately well controlled.  His labs are negative for secondary causes or endorgan damage.  I have asked him to start taking a thiazide diuretic.  I will treat the vitamin D deficiency. -     Comprehensive metabolic panel; Future -     CBC with Differential/Platelet; Future -     TSH; Future -     Urinalysis, Routine w reflex microscopic; Future -     VITAMIN D 25 Hydroxy (Vit-D Deficiency, Fractures); Future -     indapamide (LOZOL) 1.25 MG tablet; Take 1 tablet (1.25 mg total) by mouth daily.   Hyperglycemia- His A1c is up to 6.4%.  He is prediabetic.  Medical therapy is not yet indicated. -     Comprehensive metabolic panel; Future -     Hemoglobin A1c; Future  Routine general medical examination at a health care facility- Exam completed, labs reviewed, vaccines reviewed, screening for colon cancer is up-to-date, patient education material was given. -     Lipid panel; Future -     PSA; Future  Chest pressure- His EKG is negative for LVH or ischemia.  His d-dimer is slightly elevated.  Troponin and BNP are normal.  -     EKG 12-Lead -     Brain natriuretic peptide; Future -     Troponin I -; Future -     D-dimer, quantitative (not at [facility]); Future -     MYOCARDIAL PERFUSION IMAGING; Future  Heterozygous factor V Leiden mutation ([place])- His d-dimer is slightly elevated and he has a significant history of thrombosis with a saddle PE  in 2013.  I therefore recommended that he restart anticoagulation with  rivaroxaban. -     D-dimer, quantitative (not at [facility]); Future -     rivaroxaban (XARELTO) 10 MG TABS tablet; Take 1 tablet (10 mg total) by mouth daily.  Dyspnea on exertion- I am concerned about cardiac ischemia.  His EKG shows RSR' in V1. I have therefore asked him to undergo a myocardial perfusion imaging to screen for coronary atherosclerosis. -     Brain natriuretic peptide; Future -     Troponin I -; Future -     MYOCARDIAL PERFUSION IMAGING; Future  GERD with esophagitis -     pantoprazole (PROTONIX) 40 MG tablet; Take 1 tablet (40 mg total) by mouth daily.  Vitamin D deficiency disease -     Cholecalciferol 50 MCG (2000 UT) TABS; Take 2 tablets (4,000 Units total) by mouth daily.   I have discontinued Leonte Horrigan. Kinne's carvedilol. I am also having him start on Cholecalciferol, indapamide, and rivaroxaban. Additionally, I am having him maintain his multivitamin with minerals, albuterol, sildenafil, and pantoprazole.  Meds ordered this encounter  Medications   pantoprazole (PROTONIX) 40 MG tablet    Sig: Take 1 tablet (40 mg total) by mouth daily.    Dispense:  90 tablet    Refill:  1   Cholecalciferol 50 MCG (2000 UT) TABS    Sig: Take 2 tablets (4,000 Units total) by mouth daily.    Dispense:  180 tablet    Refill:  0   indapamide (LOZOL) 1.25 MG tablet    Sig: Take 1 tablet (1.25 mg total) by mouth daily.    Dispense:  90 tablet    Refill:  0   rivaroxaban (XARELTO) 10 MG TABS tablet    Sig: Take 1 tablet (10 mg total) by mouth daily.    Dispense:  90 tablet    Refill:  1     Follow-up: Return in about 3 months (around 02/01/2019).  Scarlette Calico, MD

## 2018-11-02 ENCOUNTER — Telehealth: Payer: Self-pay | Admitting: Emergency Medicine

## 2018-11-02 ENCOUNTER — Other Ambulatory Visit (INDEPENDENT_AMBULATORY_CARE_PROVIDER_SITE_OTHER): Payer: BLUE CROSS/BLUE SHIELD

## 2018-11-02 DIAGNOSIS — I10 Essential (primary) hypertension: Secondary | ICD-10-CM | POA: Diagnosis not present

## 2018-11-02 DIAGNOSIS — R0609 Other forms of dyspnea: Secondary | ICD-10-CM

## 2018-11-02 DIAGNOSIS — D6851 Activated protein C resistance: Secondary | ICD-10-CM

## 2018-11-02 DIAGNOSIS — R739 Hyperglycemia, unspecified: Secondary | ICD-10-CM | POA: Diagnosis not present

## 2018-11-02 DIAGNOSIS — R0600 Dyspnea, unspecified: Secondary | ICD-10-CM

## 2018-11-02 DIAGNOSIS — Z125 Encounter for screening for malignant neoplasm of prostate: Secondary | ICD-10-CM

## 2018-11-02 DIAGNOSIS — Z0000 Encounter for general adult medical examination without abnormal findings: Secondary | ICD-10-CM | POA: Diagnosis not present

## 2018-11-02 DIAGNOSIS — R0789 Other chest pain: Secondary | ICD-10-CM | POA: Diagnosis not present

## 2018-11-02 LAB — LIPID PANEL
Cholesterol: 154 mg/dL (ref 0–200)
HDL: 49.1 mg/dL (ref >39.00)
LDL Cholesterol: 88 mg/dL (ref 0–99)
NonHDL: 104.91
Total CHOL/HDL Ratio: 3
Triglycerides: 84 mg/dL (ref 0.0–149.0)
VLDL: 16.8 mg/dL (ref 0.0–40.0)

## 2018-11-02 LAB — URINALYSIS, ROUTINE W REFLEX MICROSCOPIC
Bilirubin Urine: NEGATIVE
Hgb urine dipstick: NEGATIVE
Ketones, ur: NEGATIVE
Leukocytes,Ua: NEGATIVE
Nitrite: NEGATIVE
RBC / HPF: NONE SEEN (ref 0–?)
Specific Gravity, Urine: 1.025 (ref 1.000–1.030)
Total Protein, Urine: NEGATIVE
Urine Glucose: NEGATIVE
Urobilinogen, UA: 0.2 (ref 0.0–1.0)
pH: 6 (ref 5.0–8.0)

## 2018-11-02 LAB — CBC WITH DIFFERENTIAL/PLATELET
Basophils Absolute: 0.1 10*3/uL (ref 0.0–0.1)
Basophils Relative: 1.2 % (ref 0.0–3.0)
Eosinophils Absolute: 0.3 10*3/uL (ref 0.0–0.7)
Eosinophils Relative: 3 % (ref 0.0–5.0)
HCT: 42.3 % (ref 39.0–52.0)
Hemoglobin: 14.4 g/dL (ref 13.0–17.0)
Lymphocytes Relative: 26.8 % (ref 12.0–46.0)
Lymphs Abs: 2.8 10*3/uL (ref 0.7–4.0)
MCHC: 34 g/dL (ref 30.0–36.0)
MCV: 87.4 fl (ref 78.0–100.0)
Monocytes Absolute: 0.8 10*3/uL (ref 0.1–1.0)
Monocytes Relative: 8.1 % (ref 3.0–12.0)
Neutro Abs: 6.3 10*3/uL (ref 1.4–7.7)
Neutrophils Relative %: 60.9 % (ref 43.0–77.0)
Platelets: 243 10*3/uL (ref 150.0–400.0)
RBC: 4.85 Mil/uL (ref 4.22–5.81)
RDW: 14.5 % (ref 11.5–15.5)
WBC: 10.4 10*3/uL (ref 4.0–10.5)

## 2018-11-02 LAB — COMPREHENSIVE METABOLIC PANEL
ALT: 22 U/L (ref 0–53)
AST: 17 U/L (ref 0–37)
Albumin: 3.9 g/dL (ref 3.5–5.2)
Alkaline Phosphatase: 44 U/L (ref 39–117)
BUN: 15 mg/dL (ref 6–23)
CO2: 25 mEq/L (ref 19–32)
Calcium: 8.9 mg/dL (ref 8.4–10.5)
Chloride: 106 mEq/L (ref 96–112)
Creatinine, Ser: 0.83 mg/dL (ref 0.40–1.50)
GFR: 117.23 mL/min (ref >60.00)
Glucose, Bld: 108 mg/dL — ABNORMAL HIGH (ref 70–99)
Potassium: 3.8 mEq/L (ref 3.5–5.1)
Sodium: 138 mEq/L (ref 135–145)
Total Bilirubin: 0.3 mg/dL (ref 0.2–1.2)
Total Protein: 7.3 g/dL (ref 6.0–8.3)

## 2018-11-02 LAB — BRAIN NATRIURETIC PEPTIDE: Pro B Natriuretic peptide (BNP): 29 pg/mL (ref 0.0–100.0)

## 2018-11-02 LAB — TROPONIN I: TNIDX: 0.01 ug/l (ref 0.00–0.06)

## 2018-11-02 LAB — TSH: TSH: 1.36 u[IU]/mL (ref 0.35–4.50)

## 2018-11-02 LAB — PSA: PSA: 0.37 ng/mL (ref 0.10–4.00)

## 2018-11-02 NOTE — Telephone Encounter (Signed)
LVM for pt in regards to lab ordered placed by Dr Ronnald Ramp yesterday. Advised pt that labs needed to be drawn per MD.

## 2018-11-03 ENCOUNTER — Encounter: Payer: Self-pay | Admitting: Internal Medicine

## 2018-11-03 DIAGNOSIS — E559 Vitamin D deficiency, unspecified: Secondary | ICD-10-CM | POA: Insufficient documentation

## 2018-11-03 LAB — D-DIMER, QUANTITATIVE (NOT AT ARMC): D-Dimer, Quant: 0.68 mcg/mL FEU — ABNORMAL HIGH (ref <0.50)

## 2018-11-03 LAB — HEMOGLOBIN A1C: Hgb A1c MFr Bld: 6.4 % (ref 4.6–6.5)

## 2018-11-03 LAB — VITAMIN D 25 HYDROXY (VIT D DEFICIENCY, FRACTURES): VITD: 28.13 ng/mL — ABNORMAL LOW (ref 30.00–100.00)

## 2018-11-03 MED ORDER — CHOLECALCIFEROL 50 MCG (2000 UT) PO TABS: 2.0000 | ORAL_TABLET | Freq: Every day | ORAL | 0 refills | Status: DC

## 2018-11-04 ENCOUNTER — Telehealth: Payer: Self-pay | Admitting: Internal Medicine

## 2018-11-04 DIAGNOSIS — I10 Essential (primary) hypertension: Secondary | ICD-10-CM

## 2018-11-04 DIAGNOSIS — D6851 Activated protein C resistance: Secondary | ICD-10-CM

## 2018-11-04 DIAGNOSIS — E559 Vitamin D deficiency, unspecified: Secondary | ICD-10-CM

## 2018-11-04 MED ORDER — RIVAROXABAN 10 MG PO TABS: 10.0000 mg | ORAL_TABLET | Freq: Every day | ORAL | 1 refills | Status: DC

## 2018-11-04 MED ORDER — INDAPAMIDE 1.25 MG PO TABS: 1.2500 mg | ORAL_TABLET | Freq: Every day | ORAL | 0 refills | Status: DC

## 2018-11-04 NOTE — Telephone Encounter (Signed)
Pt calling to speak with Colletta Maryland.

## 2018-11-04 NOTE — Telephone Encounter (Signed)
Copied from [place] [id number]. Topic: Quick Communication - Rx Refill/Question >> Nov 04, 2018  3:48 PM Alanda Slim E wrote: Medication: rivaroxaban (XARELTO) 10 MG TABS tablet indapamide (LOZOL) 1.25 MG tablet Were sent to the wrong pharmacy and the Pt wants to speak with Colletta Maryland to know why he is taking XARELTO/ please advise   Has the patient contacted their pharmacy?  Yes   Preferred Pharmacy (with phone number or street name): [place], [address] [phone] (Phone) [phone] (Fax)    Agent: Please be advised that RX refills may take up to 3 business days. We ask that you follow-up with your pharmacy.

## 2018-11-05 ENCOUNTER — Other Ambulatory Visit: Payer: Self-pay | Admitting: Internal Medicine

## 2018-11-05 DIAGNOSIS — D6851 Activated protein C resistance: Secondary | ICD-10-CM

## 2018-11-05 MED ORDER — CHOLECALCIFEROL 50 MCG (2000 UT) PO TABS: 2.0000 | ORAL_TABLET | Freq: Every day | ORAL | 0 refills | Status: DC

## 2018-11-05 MED ORDER — RIVAROXABAN 10 MG PO TABS: 10.0000 mg | ORAL_TABLET | Freq: Every day | ORAL | 1 refills | Status: DC

## 2018-11-05 MED ORDER — INDAPAMIDE 1.25 MG PO TABS: 1.2500 mg | ORAL_TABLET | Freq: Every day | ORAL | 0 refills | Status: DC

## 2018-11-05 NOTE — Telephone Encounter (Signed)
There is a telephone about this.

## 2018-11-05 NOTE — Telephone Encounter (Signed)
Patient is calling in requesting a call back from [place]. Patient is also requesting medications that were sent in on 6/4 be sent to the [facility] pharmacy on [address], as requested.

## 2018-11-05 NOTE — Telephone Encounter (Signed)
Contacted pt and he wanted to know if the pantoprazole was for his BP. Informed that is was for GERD. Informed pt that the vitamin D supplement was for vitamin D deficiency. Informed pt that the indapamide was for BP. Pt requested rx's to go to [facility] instead of [facility]. Rx have been resent.

## 2018-11-10 ENCOUNTER — Telehealth (HOSPITAL_COMMUNITY): Payer: Self-pay | Admitting: *Deleted

## 2018-11-10 NOTE — Telephone Encounter (Signed)
Close encounter 

## 2018-11-11 ENCOUNTER — Ambulatory Visit (HOSPITAL_COMMUNITY)
Admission: RE | Admit: 2018-11-11 | Discharge: 2018-11-11 | Disposition: A | Discharge status: Home / Self Care | Payer: BLUE CROSS/BLUE SHIELD | Source: Ambulatory Visit | Attending: Cardiology | Admitting: Cardiology

## 2018-11-11 ENCOUNTER — Other Ambulatory Visit: Payer: Self-pay

## 2018-11-11 DIAGNOSIS — R0600 Dyspnea, unspecified: Secondary | ICD-10-CM

## 2018-11-11 DIAGNOSIS — R0609 Other forms of dyspnea: Secondary | ICD-10-CM | POA: Insufficient documentation

## 2018-11-11 DIAGNOSIS — R0789 Other chest pain: Secondary | ICD-10-CM | POA: Diagnosis not present

## 2018-11-11 LAB — MYOCARDIAL PERFUSION IMAGING
LV dias vol: 125 mL (ref 62–150)
LV sys vol: 61 mL
Peak HR: 109 {beats}/min
Rest HR: 66 {beats}/min
SDS: 2
SRS: 0
SSS: 2
TID: 1.23

## 2018-11-11 MED ORDER — TECHNETIUM TC 99M TETROFOSMIN IV KIT
32.6000 | PACK | Freq: Once | INTRAVENOUS | Status: AC | PRN
  Administered 2018-11-11: 32.6 via INTRAVENOUS
  Filled 2018-11-11: qty 33

## 2018-11-11 MED ORDER — TECHNETIUM TC 99M TETROFOSMIN IV KIT
10.9000 | PACK | Freq: Once | INTRAVENOUS | Status: AC | PRN
  Administered 2018-11-11: 10.9 via INTRAVENOUS
  Filled 2018-11-11: qty 11

## 2018-11-11 MED ORDER — REGADENOSON 0.4 MG/5ML IV SOLN
0.4000 mg | Freq: Once | INTRAVENOUS | Status: AC
  Administered 2018-11-11: 0.4 mg via INTRAVENOUS

## 2018-11-16 ENCOUNTER — Encounter: Payer: Self-pay | Admitting: Internal Medicine

## 2018-12-08 NOTE — Progress Notes (Signed)
[name] [address] [place], [place] [address] Phone: [phone] Subjective:   [name], am serving as a scribe for Dr. Hulan Saas.  CC: knee pain follow up  HRC:[id number]   08/16/2018: Bilateral longitudinal breakdown.  This can causing more increased discomfort of the knee secondary to alignment.  Patient will be fitted for custom orthotics to try to support this and hopefully help with some alignment.  Discussed icing regimen and home exercises.  Follow-up again 10 weeks after getting these.  Injection given today.  Tolerated the procedure well.  Patient's covered viscosupplementation in the.  Encouraged him to do more the bracing.  With patient's breakdown of the longitudinal arch will be fitted phonics that could help with some alignment.  Discussed icing regimen.  Follow-up again in 10 weeks otherwise  Update 12/09/2018: [name] is a 53 y.o. male coming in with complaint of knee pain R>L. Pain is constant. Swelling and pain has gotten worse the past 4 days due to an increase in work load. Was unable to get into shower without help last night. Would like injections today as they have worked previously to reduce his pain.     Past Medical History:  Diagnosis Date  . Anxiety   . Arthritis    knees - no med  . Cluster headache    Hx - resolved per patient  . Depression   . DVT of lower extremity (deep venous thrombosis) ([place]) 6/26-28/2013   Xarelto - resolved - history  . HTN (hypertension)    currently no meds  . Irritability and anger    PMH of  . Kidney stone    passed stone, no surgery required.  . Rotator cuff arthropathy 2007   right  . Saddle pulmonary embolus ([place]) 6/26-28/2013   post orthopedic surgery - resolved  . Sleep apnea 5/15   moderate OSA-has cpap but does not use it   Past Surgical History:  Procedure Laterality Date  . arthroscopic knee surgery  10/09/2011   Dr Ninfa Linden  . CARDIAC CATHETERIZATION   2007   LVdysfunction; normal coronaries  . KNEE ARTHROSCOPY WITH MEDIAL MENISECTOMY Left 09/20/2014   medial menisectomy chondroplastytella medial plica excision  ;  Surgeon: Dorna Leitz, MD;  Location: [facility];  Service: Orthopedics;  Laterality: Left;  . ROTATOR CUFF REPAIR  2007   right  . UPPER GASTROINTESTINAL ENDOSCOPY  2007   normal  . WISDOM TOOTH EXTRACTION     Social History   Socioeconomic History  . Marital status: Married    Spouse name: Not on file  . Number of children: 6  . Years of education: 34  . Highest education level: Not on file  Occupational History  . Occupation: Glass blower/designer   . Occupation: Programmer, systems  Social Needs  . Financial resource strain: Not on file  . Food insecurity    Worry: Not on file    Inability: Not on file  . Transportation needs    Medical: Not on file    Non-medical: Not on file  Tobacco Use  . Smoking status: Former Smoker    Types: Cigars  . Smokeless tobacco: Never Used  . Tobacco comment: every 3-4 months  Substance and Sexual Activity  . Alcohol use: Yes    Alcohol/week: 0.0 standard drinks    Comment: drinks occasionally  . Drug use: No  . Sexual activity: Not on file  Lifestyle  . Physical activity  Days per week: Not on file    Minutes per session: Not on file  . Stress: Not on file  Relationships  . Social Herbalist on phone: Not on file    Gets together: Not on file    Attends religious service: Not on file    Active member of club or organization: Not on file    Attends meetings of clubs or organizations: Not on file    Relationship status: Not on file  Other Topics Concern  . Not on file  Social History Narrative   Fun: Exercise   Allergies  Allergen Reactions  . Tramadol Other (See Comments)    insomnia   Family History  Problem Relation Age of Onset  . Cancer Mother        Mesothelioma   . Diabetes Brother   . Hypertension Brother   . Stroke Brother 73   . Heart attack Brother 10  . Diabetes Maternal Aunt   . Diabetes Maternal Grandmother   . Heart disease Maternal Grandmother   . Hypertension Maternal Grandmother   . Stroke Maternal Grandmother        in 42s  . Deep vein thrombosis Maternal Grandmother   . Stroke Maternal Grandfather        > 55  . Migraines Brother   . Heart attack Brother 3  . Colon cancer Neg Hx   . Esophageal cancer Neg Hx   . Rectal cancer Neg Hx   . Stomach cancer Neg Hx   . Liver cancer Neg Hx   . Pancreatic cancer Neg Hx      Current Outpatient Medications (Cardiovascular):  .  indapamide (LOZOL) 1.25 MG tablet, Take 1 tablet (1.25 mg total) by mouth daily. .  sildenafil (REVATIO) 20 MG tablet, TAKE THREE TABLETS BY MOUTH EVERY DAY AS NEEDED  Current Outpatient Medications (Respiratory):  .  albuterol (PROVENTIL HFA;VENTOLIN HFA) 108 (90 Base) MCG/ACT inhaler, Inhale 2 puffs into the lungs every 6 (six) hours as needed for wheezing or shortness of breath.   Current Outpatient Medications (Hematological):  .  rivaroxaban (XARELTO) 10 MG TABS tablet, Take 1 tablet (10 mg total) by mouth daily.  Current Outpatient Medications (Other):  [name]  Cholecalciferol 50 MCG (2000 UT) TABS, Take 2 tablets (4,000 Units total) by mouth daily. .  Multiple Vitamin (MULTIVITAMIN WITH MINERALS) TABS tablet, Take 1 tablet by mouth daily. .  pantoprazole (PROTONIX) 40 MG tablet, Take 1 tablet (40 mg total) by mouth daily.    Past medical history, social, surgical and family history all reviewed in electronic medical record.  No pertanent information unless stated regarding to the chief complaint.   Review of Systems:  No headache, visual changes, nausea, vomiting, diarrhea, constipation, dizziness, abdominal pain, skin rash, fevers, chills, night sweats, weight loss, swollen lymph nodes, body aches, joint swelling, muscle aches, chest pain, shortness of breath, mood changes.   Objective  Blood pressure 120/86, pulse  (!) 108, height 5\' 7"  (1.702 m), SpO2 98 %.    General: No apparent distress alert and oriented x3 mood and affect normal, dressed appropriately.  HEENT: Pupils equal, extraocular movements intact  Respiratory: Patient's speak in full sentences and does not appear short of breath  Cardiovascular: No lower extremity edema, non tender, no erythema  Skin: Warm dry intact with no signs of infection or rash on extremities or on axial skeleton.  Abdomen: Soft nontender  Neuro: Cranial nerves II through XII are intact, neurovascularly intact  in all extremities with 2+ DTRs and 2+ pulses.  Lymph: No lymphadenopathy of posterior or anterior cervical chain or axillae bilaterally.  Gait normal with good balance and coordination.  MSK:  Non tender with full range of motion and good stability and symmetric strength and tone of shoulders, elbows, wrist, hip and ankles bilaterally.  Knee: valgus deformity noted. Large thigh to calf ratio.  Tender to palpation over medial and PF joint line.  ROM full in flexion and extension and lower leg rotation. instability with valgus force.  painful patellar compression. Patellar glide with moderate crepitus. Patellar and quadriceps tendons unremarkable. Hamstring and quadriceps strength is normal. Contralateral knee shows  After informed written and verbal consent, patient was seated on exam table. Right knee was prepped with alcohol swab and utilizing anterolateral approach, patient's right knee space was injected with 4:1  marcaine 0.5%: Kenalog 40mg /dL. Patient tolerated the procedure well without immediate complications.  After informed written and verbal consent, patient was seated on exam table. Left knee was prepped with alcohol swab and utilizing anterolateral approach, patient's left knee space was injected with 4:1  marcaine 0.5%: Kenalog 40mg /dL. Patient tolerated the procedure well without immediate complications.   Impression and Recommendations:      This case required medical decision making of moderate complexity. The above documentation has been reviewed and is accurate and complete Lyndal Pulley, DO       Note: This dictation was prepared with Dragon dictation along with smaller phrase technology. Any transcriptional errors that result from this process are unintentional.

## 2018-12-09 ENCOUNTER — Ambulatory Visit (INDEPENDENT_AMBULATORY_CARE_PROVIDER_SITE_OTHER): Payer: BC Managed Care – PPO | Admitting: Family Medicine

## 2018-12-09 ENCOUNTER — Other Ambulatory Visit: Payer: Self-pay

## 2018-12-09 ENCOUNTER — Encounter: Payer: Self-pay | Admitting: Family Medicine

## 2018-12-09 DIAGNOSIS — M170 Bilateral primary osteoarthritis of knee: Secondary | ICD-10-CM | POA: Diagnosis not present

## 2018-12-09 NOTE — Assessment & Plan Note (Signed)
Bilateral injections given today.  Tolerated procedure well.  Discussed icing regimen and home exercise.  Discussed which activities of doing which wants to avoid.  Inflammatories.  Can repeat every 10 weeks.  Patient wants to avoid any surgical intervention still.

## 2018-12-09 NOTE — Patient Instructions (Signed)
Congrats on everything See me again in 10 weeks

## 2018-12-16 DIAGNOSIS — J0100 Acute maxillary sinusitis, unspecified: Secondary | ICD-10-CM | POA: Diagnosis not present

## 2018-12-16 DIAGNOSIS — R0602 Shortness of breath: Secondary | ICD-10-CM | POA: Diagnosis not present

## 2018-12-16 DIAGNOSIS — Z86711 Personal history of pulmonary embolism: Secondary | ICD-10-CM | POA: Diagnosis not present

## 2018-12-16 DIAGNOSIS — Z1159 Encounter for screening for other viral diseases: Secondary | ICD-10-CM | POA: Diagnosis not present

## 2019-02-01 ENCOUNTER — Emergency Department (HOSPITAL_COMMUNITY)
Admission: EM | Admit: 2019-02-01 | Discharge: 2019-02-02 | Disposition: A | Discharge status: Home / Self Care | Payer: BC Managed Care – PPO | Attending: Critical Care Medicine | Admitting: Critical Care Medicine

## 2019-02-01 ENCOUNTER — Other Ambulatory Visit: Payer: Self-pay

## 2019-02-01 ENCOUNTER — Encounter (HOSPITAL_COMMUNITY): Payer: Self-pay

## 2019-02-01 ENCOUNTER — Emergency Department (HOSPITAL_COMMUNITY): Payer: BC Managed Care – PPO

## 2019-02-01 DIAGNOSIS — Z87891 Personal history of nicotine dependence: Secondary | ICD-10-CM | POA: Diagnosis not present

## 2019-02-01 DIAGNOSIS — R0602 Shortness of breath: Secondary | ICD-10-CM | POA: Insufficient documentation

## 2019-02-01 DIAGNOSIS — Z86711 Personal history of pulmonary embolism: Secondary | ICD-10-CM | POA: Insufficient documentation

## 2019-02-01 DIAGNOSIS — R0789 Other chest pain: Secondary | ICD-10-CM | POA: Insufficient documentation

## 2019-02-01 DIAGNOSIS — I10 Essential (primary) hypertension: Secondary | ICD-10-CM | POA: Insufficient documentation

## 2019-02-01 DIAGNOSIS — Z86718 Personal history of other venous thrombosis and embolism: Secondary | ICD-10-CM | POA: Diagnosis not present

## 2019-02-01 DIAGNOSIS — R079 Chest pain, unspecified: Secondary | ICD-10-CM | POA: Diagnosis not present

## 2019-02-01 DIAGNOSIS — R0689 Other abnormalities of breathing: Secondary | ICD-10-CM | POA: Diagnosis not present

## 2019-02-01 DIAGNOSIS — Z7901 Long term (current) use of anticoagulants: Secondary | ICD-10-CM | POA: Diagnosis not present

## 2019-02-01 DIAGNOSIS — Z79899 Other long term (current) drug therapy: Secondary | ICD-10-CM | POA: Diagnosis not present

## 2019-02-01 LAB — COMPREHENSIVE METABOLIC PANEL
ALT: 23 U/L (ref 0–44)
AST: 18 U/L (ref 15–41)
Albumin: 3.2 g/dL — ABNORMAL LOW (ref 3.5–5.0)
Alkaline Phosphatase: 44 U/L (ref 38–126)
Anion gap: 9 (ref 5–15)
BUN: 14 mg/dL (ref 6–20)
CO2: 23 mmol/L (ref 22–32)
Calcium: 8.4 mg/dL — ABNORMAL LOW (ref 8.9–10.3)
Chloride: 107 mmol/L (ref 98–111)
Creatinine, Ser: 0.91 mg/dL (ref 0.61–1.24)
GFR calc Af Amer: >60 mL/min (ref >60)
GFR calc non Af Amer: >60 mL/min (ref >60)
Glucose, Bld: 120 mg/dL — ABNORMAL HIGH (ref 70–99)
Potassium: 3.4 mmol/L — ABNORMAL LOW (ref 3.5–5.1)
Sodium: 139 mmol/L (ref 135–145)
Total Bilirubin: 0.6 mg/dL (ref 0.3–1.2)
Total Protein: 6.2 g/dL — ABNORMAL LOW (ref 6.5–8.1)

## 2019-02-01 LAB — CBC WITH DIFFERENTIAL/PLATELET
Abs Immature Granulocytes: 0.04 10*3/uL (ref 0.00–0.07)
Basophils Absolute: 0.1 10*3/uL (ref 0.0–0.1)
Basophils Relative: 1 %
Eosinophils Absolute: 0.3 10*3/uL (ref 0.0–0.5)
Eosinophils Relative: 3 %
HCT: 40 % (ref 39.0–52.0)
Hemoglobin: 13.4 g/dL (ref 13.0–17.0)
Immature Granulocytes: 0 %
Lymphocytes Relative: 25 %
Lymphs Abs: 2.7 10*3/uL (ref 0.7–4.0)
MCH: 29.8 pg (ref 26.0–34.0)
MCHC: 33.5 g/dL (ref 30.0–36.0)
MCV: 88.9 fL (ref 80.0–100.0)
Monocytes Absolute: 0.9 10*3/uL (ref 0.1–1.0)
Monocytes Relative: 9 %
Neutro Abs: 6.9 10*3/uL (ref 1.7–7.7)
Neutrophils Relative %: 62 %
Platelets: 216 10*3/uL (ref 150–400)
RBC: 4.5 MIL/uL (ref 4.22–5.81)
RDW: 13.2 % (ref 11.5–15.5)
WBC: 10.8 10*3/uL — ABNORMAL HIGH (ref 4.0–10.5)
nRBC: 0 % (ref 0.0–0.2)

## 2019-02-01 LAB — TROPONIN I (HIGH SENSITIVITY): Troponin I (High Sensitivity): 3 ng/L (ref <18)

## 2019-02-01 MED ORDER — MORPHINE SULFATE (PF) 4 MG/ML IV SOLN
4.0000 mg | Freq: Once | INTRAVENOUS | Status: AC
  Administered 2019-02-01: 4 mg via INTRAVENOUS
  Filled 2019-02-01: qty 1

## 2019-02-01 NOTE — ED Triage Notes (Signed)
[facility] reports pt with intermittent CP since Saturday, today it increased to 10/10 with SOB,  Pt given 324mg  ASA, 2 NTG with no relief, RR 20-30's, placed on 2 lpm oxygen for comfort other VS stable

## 2019-02-01 NOTE — ED Provider Notes (Signed)
[facility] EMERGENCY DEPARTMENT Provider Note   CSN: [id number] Arrival date & time: 02/01/19  2155     History   Chief Complaint Chief Complaint  Patient presents with   Chest Pain    HPI [name] is a 53 y.o. male.     Patient with history of OSA, HTN, DVT/PE (2013, not anticoagulated), PUD presents to ED with c/o bilateral chest pain radiating to bilateral UE's that started this morning and is associated with SOB. There is no exertional component. Symptoms are similar to episodes of recurrent chest pain and SOB, but he reports today the pain intensified. EMS gave him aspirin and NTG x 2 without affecting symptoms. No nausea, diaphoresis, cough, congestion, fever. No aggravating or alleviating factors.   The history is provided by the patient. No language interpreter was used.  Chest Pain Associated symptoms: shortness of breath   Associated symptoms: no abdominal pain, no fever, no nausea and no vomiting     Past Medical History:  Diagnosis Date   Anxiety    Arthritis    knees - no med   Cluster headache    Hx - resolved per patient   Depression    DVT of lower extremity (deep venous thrombosis) ([place]) 6/26-28/2013   Xarelto - resolved - history   HTN (hypertension)    currently no meds   Irritability and anger    PMH of   Kidney stone    passed stone, no surgery required.   Rotator cuff arthropathy 2007   right   Saddle pulmonary embolus ([place]) 6/26-28/2013   post orthopedic surgery - resolved   Sleep apnea 5/15   moderate OSA-has cpap but does not use it    Patient Active Problem List   Diagnosis Date Noted   Vitamin D deficiency disease 11/03/2018   PUD (peptic ulcer disease) 01/20/2018   Renal stone 08/26/2017   Morbid obesity ([place]) 08/25/2017   GERD with esophagitis 08/24/2017   Degenerative arthritis of knee, bilateral 06/08/2017   Venous insufficiency of both lower extremities 10/05/2015   Routine  general medical examination at a health care facility 08/21/2015   Heterozygous factor V Leiden mutation ([place]) 08/24/2013   OSA (obstructive sleep apnea) 08/13/2013   Hyperglycemia 11/10/2008   Mixed hyperlipidemia 07/06/2008   Essential hypertension 07/06/2008   ERECTILE DYSFUNCTION, ORGANIC 01/28/2007    Past Surgical History:  Procedure Laterality Date   arthroscopic knee surgery  10/09/2011   Dr Ninfa Linden   CARDIAC CATHETERIZATION  2007   LVdysfunction; normal coronaries   KNEE ARTHROSCOPY WITH MEDIAL MENISECTOMY Left 09/20/2014   medial menisectomy chondroplastytella medial plica excision  ;  Surgeon: Dorna Leitz, MD;  Location: [facility];  Service: Orthopedics;  Laterality: Left;   ROTATOR CUFF REPAIR  2007   right   UPPER GASTROINTESTINAL ENDOSCOPY  2007   normal   WISDOM TOOTH EXTRACTION          Home Medications    Prior to Admission medications   Medication Sig Start Date End Date Taking? Authorizing Provider  albuterol (PROVENTIL HFA;VENTOLIN HFA) 108 (90 Base) MCG/ACT inhaler Inhale 2 puffs into the lungs every 6 (six) hours as needed for wheezing or shortness of breath. 12/25/17   Rigoberto Noel, MD  Cholecalciferol 50 MCG (2000 UT) TABS Take 2 tablets (4,000 Units total) by mouth daily. 11/05/18   [name], MD  indapamide (LOZOL) 1.25 MG tablet Take 1 tablet (1.25 mg total) by mouth daily. 11/05/18  [name], MD  Multiple Vitamin (MULTIVITAMIN WITH MINERALS) TABS tablet Take 1 tablet by mouth daily.    [provider]  pantoprazole (PROTONIX) 40 MG tablet Take 1 tablet (40 mg total) by mouth daily. 11/01/18   [name], MD  rivaroxaban (XARELTO) 10 MG TABS tablet Take 1 tablet (10 mg total) by mouth daily. 11/05/18   [name], MD  sildenafil (REVATIO) 20 MG tablet TAKE THREE TABLETS BY MOUTH EVERY DAY AS NEEDED 07/30/18   [name], MD    Family History Family History  Problem Relation Age of Onset    Cancer Mother        Mesothelioma    Diabetes Brother    Hypertension Brother    Stroke Brother 25   Heart attack Brother 55   Diabetes Maternal Aunt    Diabetes Maternal Grandmother    Heart disease Maternal Grandmother    Hypertension Maternal Grandmother    Stroke Maternal Grandmother        in 71s   Deep vein thrombosis Maternal Grandmother    Stroke Maternal Grandfather        > 38   Migraines Brother    Heart attack Brother 72   Colon cancer Neg Hx    Esophageal cancer Neg Hx    Rectal cancer Neg Hx    Stomach cancer Neg Hx    Liver cancer Neg Hx    Pancreatic cancer Neg Hx     Social History Social History   Tobacco Use   Smoking status: Former Smoker    Types: Cigars   Smokeless tobacco: Never Used   Tobacco comment: every 3-4 months  Substance Use Topics   Alcohol use: Yes    Alcohol/week: 0.0 standard drinks    Comment: drinks occasionally   Drug use: No     Allergies   Tramadol   Review of Systems Review of Systems  Constitutional: Negative for chills and fever.  HENT: Negative.   Respiratory: Positive for shortness of breath.   Cardiovascular: Positive for chest pain.  Gastrointestinal: Negative.  Negative for abdominal pain, nausea and vomiting.  Musculoskeletal: Negative.   Skin: Negative.   Neurological: Negative.      Physical Exam Updated Vital Signs BP 119/78 (BP Location: Right Arm)    Pulse 86    Temp 98.8 F (37.1 C) (Oral)    Resp 18    SpO2 95%   Physical Exam Constitutional:      General: He is not in acute distress.    Appearance: He is well-developed. He is not diaphoretic.  HENT:     Head: Normocephalic.  Neck:     Musculoskeletal: Normal range of motion and neck supple.  Cardiovascular:     Rate and Rhythm: Normal rate and regular rhythm.     Heart sounds: No murmur.  Pulmonary:     Effort: Pulmonary effort is normal.     Breath sounds: No wheezing, rhonchi or rales.  Chest:     Chest  wall: No tenderness.  Abdominal:     General: Bowel sounds are normal.     Palpations: Abdomen is soft.     Tenderness: There is no abdominal tenderness. There is no guarding or rebound.  Musculoskeletal: Normal range of motion.     Right lower leg: No edema.     Left lower leg: No edema.  Skin:    General: Skin is warm and dry.     Findings: No rash.  Neurological:     Mental Status: He is alert and oriented to person, place, and time.      ED Treatments / Results  Labs (all labs ordered are listed, but only abnormal results are displayed) Labs Reviewed - No data to display  EKG None  Radiology No results found.  Procedures Procedures (including critical care time)  Medications Ordered in ED Medications - No data to display   Initial Impression / Assessment and Plan / ED Course  I have reviewed the triage vital signs and the nursing notes.  Pertinent labs & imaging results that were available during my care of the patient were reviewed by me and considered in my medical decision making (see chart for details).        Patient to ED with bilateral chest pain to bilateral UE's, like previous episodes, constant for the past 1 day. No fever, congestion, cough, nausea  He is seen with Dr. Johnney Killian. EKG unremarkable, without acute change. CXR clear. History recent myocardial perfusion study, "low risk". Troponin x 2 here are negative. Doubt ACS, dissection, infection.   History of PE (2013). Multiple CTA scans in the last year, negative for recurrent PE. No tachycardia, hypoxia, in the setting of pain that is recurrent in nature. No elevation of troponin. Doubt recurrent PE.  He can be discharged home to follow up with primary care.   Final Clinical Impressions(s) / ED Diagnoses   Final diagnoses:  None   1. Nonspecific chest pain  ED Discharge Orders    None       Charlann Lange, PA-C 02/02/19 0232    Charlesetta Shanks, MD 02/05/19 [phone]

## 2019-02-01 NOTE — ED Provider Notes (Signed)
Medical screening examination/treatment/procedure(s) were conducted as a shared visit with non-physician practitioner(s) and myself.  I personally evaluated the patient during the encounter.  EKG Interpretation  Date/Time:  Tuesday February 01 2019 22:06:41 EDT Ventricular Rate:  83 PR Interval:    QRS Duration: 97 QT Interval:  364 QTC Calculation: 428 R Axis:   19 Text Interpretation:  Sinus rhythm Borderline T wave abnormalities no change from previous Confirmed by Charlesetta Shanks [phone]) on 02/01/2019 10:44:23 PM Patient has chest pain which he reports is over almost all of his chest but he notices it more to the right and some radiation down the right arm.  This has been a recurrent problem.  He reports also time he just deals with having chest pain but sometimes it gets more severe and it got a lot worse as of today.  No coughing, no fever.  He reports he chronically has some swelling in the left leg where he had had a prior DVT.  Patient is alert and appropriate.  No respiratory distress at rest.  Heart regular no rub murmur gallop, lungs are clear without wheeze rhonchi or rale.  No reproducible pain to palpation.  Abdomen is soft nontender.  I agree with plan of management.  Patient has had prior PE however that has been distant with multiple repeat scans.  At this time patient does not have any hypoxia or tachycardia, symptoms have been chronic and recurrent.  Agree with proceeding with labs and if within normal limits management of pain and follow-up with PCP.   Charlesetta Shanks, MD 02/01/19 2336

## 2019-02-02 ENCOUNTER — Telehealth: Payer: Self-pay | Admitting: Pulmonary Disease

## 2019-02-02 ENCOUNTER — Ambulatory Visit: Payer: BC Managed Care – PPO | Admitting: Internal Medicine

## 2019-02-02 DIAGNOSIS — G4733 Obstructive sleep apnea (adult) (pediatric): Secondary | ICD-10-CM

## 2019-02-02 LAB — TROPONIN I (HIGH SENSITIVITY): Troponin I (High Sensitivity): 3 ng/L (ref <18)

## 2019-02-02 MED ORDER — MORPHINE SULFATE (PF) 4 MG/ML IV SOLN
4.0000 mg | Freq: Once | INTRAVENOUS | Status: AC
  Administered 2019-02-02: 4 mg via INTRAVENOUS
  Filled 2019-02-02: qty 1

## 2019-02-02 NOTE — ED Notes (Signed)
Discharge instructions discussed with pt. Pt verbalized understanding no questions at this time.

## 2019-02-02 NOTE — Discharge Instructions (Signed)
Your tests tonight do not indicate any acute condition with your heart or lungs. You can be discharged home and are strongly encouraged to see your doctor in the next week for recheck of symptoms.

## 2019-02-02 NOTE — Telephone Encounter (Signed)
Attempted to call patient, no answer, left message to call back.  

## 2019-02-02 NOTE — ED Notes (Signed)
Patient verbalizes understanding of discharge instructions. Opportunity for questioning and answers were provided. Armband removed by staff, pt discharged from ED.  

## 2019-02-03 ENCOUNTER — Encounter: Payer: Self-pay | Admitting: Internal Medicine

## 2019-02-03 ENCOUNTER — Other Ambulatory Visit: Payer: Self-pay

## 2019-02-03 ENCOUNTER — Ambulatory Visit (INDEPENDENT_AMBULATORY_CARE_PROVIDER_SITE_OTHER): Payer: BC Managed Care – PPO | Admitting: Internal Medicine

## 2019-02-03 VITALS — BP 138/84 | HR 78 | Temp 97.8°F | Resp 16 | Ht 67.0 in | Wt 262.0 lb

## 2019-02-03 DIAGNOSIS — E876 Hypokalemia: Secondary | ICD-10-CM | POA: Diagnosis not present

## 2019-02-03 DIAGNOSIS — I10 Essential (primary) hypertension: Secondary | ICD-10-CM

## 2019-02-03 DIAGNOSIS — R9430 Abnormal result of cardiovascular function study, unspecified: Secondary | ICD-10-CM | POA: Insufficient documentation

## 2019-02-03 DIAGNOSIS — T502X5A Adverse effect of carbonic-anhydrase inhibitors, benzothiadiazides and other diuretics, initial encounter: Secondary | ICD-10-CM

## 2019-02-03 MED ORDER — POTASSIUM CHLORIDE CRYS ER 20 MEQ PO TBCR: 20.0000 meq | EXTENDED_RELEASE_TABLET | Freq: Two times a day (BID) | ORAL | 1 refills | Status: DC

## 2019-02-03 NOTE — Progress Notes (Signed)
Subjective:  Patient ID: [name], male    DOB: 11-28-1965  Age: 53 y.o. MRN: [id number]  CC: Hypertension   HPI [name] presents for f/up - He was recently seen in the ED again for chest pain.  The work-up was unremarkable.  He has had this chest pain off and on for 10 years.  He describes it as an intermittent alternating sharp and dull sensation under his sternum that radiates into his right shoulder.  He recently underwent a myocardial perfusion imaging which was negative for ischemia but it suggested that he might have a slightly low EF.  He is very concerned about this and wants to do some follow-up testing.  Outpatient Medications Prior to Visit  Medication Sig Dispense Refill  . albuterol (PROVENTIL HFA;VENTOLIN HFA) 108 (90 Base) MCG/ACT inhaler Inhale 2 puffs into the lungs every 6 (six) hours as needed for wheezing or shortness of breath. 1 Inhaler 6  . Cholecalciferol 50 MCG (2000 UT) TABS Take 2 tablets (4,000 Units total) by mouth daily. 180 tablet 0  . fluticasone (FLONASE) 50 MCG/ACT nasal spray Place 1 spray into both nostrils daily.    . indapamide (LOZOL) 1.25 MG tablet Take 1 tablet (1.25 mg total) by mouth daily. 90 tablet 0  . Multiple Vitamin (MULTIVITAMIN WITH MINERALS) TABS tablet Take 1 tablet by mouth daily.    . pantoprazole (PROTONIX) 40 MG tablet Take 1 tablet (40 mg total) by mouth daily. 90 tablet 1  . rivaroxaban (XARELTO) 10 MG TABS tablet Take 1 tablet (10 mg total) by mouth daily. 90 tablet 1  . sildenafil (REVATIO) 20 MG tablet TAKE THREE TABLETS BY MOUTH EVERY DAY AS NEEDED (Patient taking differently: Take 60 mg by mouth daily as needed. ) 50 tablet 5   No facility-administered medications prior to visit.     ROS Review of Systems  Constitutional: Negative for diaphoresis and fatigue.  HENT: Negative.  Negative for trouble swallowing and voice change.   Respiratory: Positive for shortness of breath. Negative for cough, chest  tightness and wheezing.   Cardiovascular: Positive for chest pain. Negative for palpitations and leg swelling.  Gastrointestinal: Negative for abdominal pain, constipation, diarrhea, nausea and vomiting.  Endocrine: Negative.   Genitourinary: Negative.  Negative for difficulty urinating.  Musculoskeletal: Negative.  Negative for arthralgias and myalgias.  Skin: Negative.  Negative for color change and pallor.  Neurological: Negative.  Negative for dizziness, weakness, light-headedness and headaches.  Hematological: Negative for adenopathy. Does not bruise/bleed easily.  Psychiatric/Behavioral: Negative.     Objective:  BP 138/84 (BP Location: Left Arm, Patient Position: Sitting, Cuff Size: Large)   Pulse 78   Temp 97.8 F (36.6 C) (Oral)   Resp 16   Ht 5\' 7"  (1.702 m)   Wt 262 lb (118.8 kg)   SpO2 93%   BMI 41.04 kg/m   BP Readings from Last 3 Encounters:  02/03/19 138/84  02/02/19 133/86  12/09/18 120/86    Wt Readings from Last 3 Encounters:  02/03/19 262 lb (118.8 kg)  11/11/18 273 lb (123.8 kg)  11/01/18 273 lb (123.8 kg)    Physical Exam Vitals signs reviewed.  Constitutional:      General: He is not in acute distress.    Appearance: Normal appearance. He is obese. He is not ill-appearing, toxic-appearing or diaphoretic.  HENT:     Nose: Nose normal.     Mouth/Throat:     Mouth: Mucous membranes are moist.  Pharynx: No oropharyngeal exudate.  Eyes:     General: No scleral icterus.    Conjunctiva/sclera: Conjunctivae normal.  Neck:     Musculoskeletal: Normal range of motion and neck supple.  Cardiovascular:     Rate and Rhythm: Normal rate and regular rhythm.     Heart sounds: No murmur. No friction rub. No gallop.   Pulmonary:     Effort: Pulmonary effort is normal. No respiratory distress.     Breath sounds: No stridor. No wheezing, rhonchi or rales.  Chest:     Chest wall: No tenderness.  Abdominal:     General: Abdomen is flat. Bowel sounds are  normal. There is no distension.     Palpations: Abdomen is soft. There is no hepatomegaly, splenomegaly or mass.     Tenderness: There is no abdominal tenderness.  Musculoskeletal: Normal range of motion.     Right lower leg: No edema.     Left lower leg: No edema.  Lymphadenopathy:     Cervical: No cervical adenopathy.  Skin:    General: Skin is warm and dry.     Coloration: Skin is not pale.     Findings: No rash.  Neurological:     General: No focal deficit present.     Mental Status: He is alert.     Lab Results  Component Value Date   WBC 10.8 (H) 02/01/2019   HGB 13.4 02/01/2019   HCT 40.0 02/01/2019   PLT 216 02/01/2019   GLUCOSE 120 (H) 02/01/2019   CHOL 154 11/02/2018   TRIG 84.0 11/02/2018   HDL 49.10 11/02/2018   LDLCALC 88 11/02/2018   ALT 23 02/01/2019   AST 18 02/01/2019   NA 139 02/01/2019   K 3.4 (L) 02/01/2019   CL 107 02/01/2019   CREATININE 0.91 02/01/2019   BUN 14 02/01/2019   CO2 23 02/01/2019   TSH 1.36 11/02/2018   PSA 0.37 11/02/2018   INR CANCELED 08/12/2013   HGBA1C 6.4 11/02/2018    Dg Chest Portable 1 View  Result Date: 02/01/2019 CLINICAL DATA:  Chest pain EXAM: PORTABLE CHEST 1 VIEW COMPARISON:  03/08/2018 FINDINGS: The heart size and mediastinal contours are within normal limits. Both lungs are clear. The visualized skeletal structures are unremarkable. Mild bronchitic changes. IMPRESSION: No active disease. Electronically Signed   By: Donavan Foil M.D.   On: 02/01/2019 22:42    Assessment & Plan:   [name] was seen today for hypertension.  Diagnoses and all orders for this visit:  Essential hypertension- His blood pressure is adequately well controlled.  He has developed hypokalemia from the thiazide diuretic so I have asked him to start taking a potassium supplement. -     potassium chloride SA (K-DUR) 20 MEQ tablet; Take 1 tablet (20 mEq total) by mouth 2 (two) times daily.  Diuretic-induced hypokalemia -     potassium  chloride SA (K-DUR) 20 MEQ tablet; Take 1 tablet (20 mEq total) by mouth 2 (two) times daily.  Ejection fraction < 50%- I recommended that he undergo an echocardiogram to get a better and more precise estimation of his EF.  He does not want to see a cardiologist yet. -     ECHOCARDIOGRAM COMPLETE; Future   I am having [name]. [name] start on potassium chloride SA. I am also having him maintain his multivitamin with minerals, albuterol, sildenafil, pantoprazole, indapamide, Cholecalciferol, rivaroxaban, and fluticasone.  Meds ordered this encounter  Medications  . potassium chloride SA (K-DUR) 20 MEQ  tablet    Sig: Take 1 tablet (20 mEq total) by mouth 2 (two) times daily.    Dispense:  180 tablet    Refill:  1     Follow-up: No follow-ups on file.  Scarlette Calico, MD

## 2019-02-03 NOTE — Patient Instructions (Signed)

## 2019-02-04 NOTE — Telephone Encounter (Signed)
LMTCB x2 for pt 

## 2019-02-08 NOTE — Telephone Encounter (Signed)
OK to switch DME

## 2019-02-08 NOTE — Telephone Encounter (Signed)
Spoke with patient. He stated that he is requesting to switch from [facility] to [facility] for his CPAP machine and supplies. Advised patient that I would need to send a message to RA to get his ok before placing the order. He verbalized understanding.   RA, please advise if you are ok with [place] placing an order for him to switch from [facility] to [facility] for his DME. Thanks!

## 2019-02-08 NOTE — Telephone Encounter (Signed)
Left detailed message for patient to call back.

## 2019-02-08 NOTE — Telephone Encounter (Signed)
Called and spoke to pt to let him know RA approved DME switch and that we have placed the order for this. Pt verbalized understanding with no additional concerns. Order has been placed. Nothing further needed at this time.

## 2019-02-18 ENCOUNTER — Telehealth: Payer: Self-pay | Admitting: Internal Medicine

## 2019-02-18 NOTE — Telephone Encounter (Signed)
Patient is calling to check on the status of his referral for echocardiogram to get a better and more precise estimation of his EF.  Patient would like to know where does he need to go for this. Please advise. [phone]  Patient is also asking to speak to [facility] would not disclose. States that it is very important.

## 2019-02-21 ENCOUNTER — Telehealth: Payer: Self-pay | Admitting: Pulmonary Disease

## 2019-02-21 NOTE — Telephone Encounter (Signed)
Spoke to pt. Informed pt Cards will call to schedule the echo and I have scheduled pt for flu vaccine this week.

## 2019-02-21 NOTE — Telephone Encounter (Signed)
Call made to pt. No answer. LVM for pt to call back.

## 2019-02-21 NOTE — Telephone Encounter (Signed)
Spoke with pt, he states she spoke to [place] already and to disregard this message.

## 2019-02-23 ENCOUNTER — Other Ambulatory Visit: Payer: Self-pay

## 2019-02-23 ENCOUNTER — Encounter: Payer: Self-pay | Admitting: Family Medicine

## 2019-02-23 ENCOUNTER — Ambulatory Visit: Payer: BC Managed Care – PPO

## 2019-02-23 ENCOUNTER — Ambulatory Visit (INDEPENDENT_AMBULATORY_CARE_PROVIDER_SITE_OTHER): Payer: BC Managed Care – PPO | Admitting: Family Medicine

## 2019-02-23 DIAGNOSIS — Z23 Encounter for immunization: Secondary | ICD-10-CM | POA: Diagnosis not present

## 2019-02-23 DIAGNOSIS — M7551 Bursitis of right shoulder: Secondary | ICD-10-CM

## 2019-02-23 DIAGNOSIS — M170 Bilateral primary osteoarthritis of knee: Secondary | ICD-10-CM

## 2019-02-23 NOTE — Patient Instructions (Signed)
Good to see you Let me know how you are feeling in 2 weeks. If no better call Ria Comment [phone]

## 2019-02-23 NOTE — Progress Notes (Signed)
[facility] [name], [place] [address] Phone: [phone] Subjective:   I [name] am serving as a Education administrator for Dr. Hulan Saas.  I'm seeing this patient by the request  of:    CC: Bilateral knee, right shoulder pain  [id number]   12/09/2018 Bilateral injections given today.  Tolerated procedure well.  Discussed icing regimen and home exercise.  Discussed which activities of doing which wants to avoid.  Inflammatories.  Can repeat every 10 weeks.  Patient wants to avoid any surgical intervention still.  Update 02/23/2019 [name] is a 53 y.o. male coming in with complaint of bilateral knee pain. States he also has right shoulder pain. Limited ROM. Muscle weakness and decreased grip strength. History of shoulder surgery on left side about 6 years ago.   Onset- 2 months  Location - rotator cuff  Duration-  Character-dull, throbbing aching pain Aggravating factors- flexion Reliving factors-repetitive motion Therapies tried- Ibuprofen  Severity-  8/10      Past Medical History:  Diagnosis Date  . Anxiety   . Arthritis    knees - no med  . Cluster headache    Hx - resolved per patient  . Depression   . DVT of lower extremity (deep venous thrombosis) ([place]) 6/26-28/2013   Xarelto - resolved - history  . HTN (hypertension)    currently no meds  . Irritability and anger    PMH of  . Kidney stone    passed stone, no surgery required.  . Rotator cuff arthropathy 2007   right  . Saddle pulmonary embolus ([place]) 6/26-28/2013   post orthopedic surgery - resolved  . Sleep apnea 5/15   moderate OSA-has cpap but does not use it   Past Surgical History:  Procedure Laterality Date  . arthroscopic knee surgery  10/09/2011   Dr Ninfa Linden  . CARDIAC CATHETERIZATION  2007   LVdysfunction; normal coronaries  . KNEE ARTHROSCOPY WITH MEDIAL MENISECTOMY Left 09/20/2014   medial menisectomy chondroplastytella medial plica  excision  ;  Surgeon: Dorna Leitz, MD;  Location: [facility];  Service: Orthopedics;  Laterality: Left;  . ROTATOR CUFF REPAIR  2007   right  . UPPER GASTROINTESTINAL ENDOSCOPY  2007   normal  . WISDOM TOOTH EXTRACTION     Social History   Socioeconomic History  . Marital status: Married    Spouse name: Not on file  . Number of children: 6  . Years of education: 90  . Highest education level: Not on file  Occupational History  . Occupation: Glass blower/designer   . Occupation: Programmer, systems  Social Needs  . Financial resource strain: Not on file  . Food insecurity    Worry: Not on file    Inability: Not on file  . Transportation needs    Medical: Not on file    Non-medical: Not on file  Tobacco Use  . Smoking status: Former Smoker    Types: Cigars  . Smokeless tobacco: Never Used  . Tobacco comment: every 3-4 months  Substance and Sexual Activity  . Alcohol use: Yes    Alcohol/week: 0.0 standard drinks    Comment: drinks occasionally  . Drug use: No  . Sexual activity: Not on file  Lifestyle  . Physical activity    Days per week: Not on file    Minutes per session: Not on file  . Stress: Not on file  Relationships  . Social connections  Talks on phone: Not on file    Gets together: Not on file    Attends religious service: Not on file    Active member of club or organization: Not on file    Attends meetings of clubs or organizations: Not on file    Relationship status: Not on file  Other Topics Concern  . Not on file  Social History Narrative   Fun: Exercise   Allergies  Allergen Reactions  . Tramadol Other (See Comments)    insomnia   Family History  Problem Relation Age of Onset  . Cancer Mother        Mesothelioma   . Diabetes Brother   . Hypertension Brother   . Stroke Brother 69  . Heart attack Brother 106  . Diabetes Maternal Aunt   . Diabetes Maternal Grandmother   . Heart disease Maternal Grandmother   . Hypertension  Maternal Grandmother   . Stroke Maternal Grandmother        in 18s  . Deep vein thrombosis Maternal Grandmother   . Stroke Maternal Grandfather        > 55  . Migraines Brother   . Heart attack Brother 48  . Colon cancer Neg Hx   . Esophageal cancer Neg Hx   . Rectal cancer Neg Hx   . Stomach cancer Neg Hx   . Liver cancer Neg Hx   . Pancreatic cancer Neg Hx      Current Outpatient Medications (Cardiovascular):  .  indapamide (LOZOL) 1.25 MG tablet, Take 1 tablet (1.25 mg total) by mouth daily. .  sildenafil (REVATIO) 20 MG tablet, TAKE THREE TABLETS BY MOUTH EVERY DAY AS NEEDED (Patient taking differently: Take 60 mg by mouth daily as needed. )  Current Outpatient Medications (Respiratory):  .  albuterol (PROVENTIL HFA;VENTOLIN HFA) 108 (90 Base) MCG/ACT inhaler, Inhale 2 puffs into the lungs every 6 (six) hours as needed for wheezing or shortness of breath. .  fluticasone (FLONASE) 50 MCG/ACT nasal spray, Place 1 spray into both nostrils daily.   Current Outpatient Medications (Hematological):  .  rivaroxaban (XARELTO) 10 MG TABS tablet, Take 1 tablet (10 mg total) by mouth daily.  Current Outpatient Medications (Other):  [name]  Cholecalciferol 50 MCG (2000 UT) TABS, Take 2 tablets (4,000 Units total) by mouth daily. .  Multiple Vitamin (MULTIVITAMIN WITH MINERALS) TABS tablet, Take 1 tablet by mouth daily. .  pantoprazole (PROTONIX) 40 MG tablet, Take 1 tablet (40 mg total) by mouth daily. .  potassium chloride SA (K-DUR) 20 MEQ tablet, Take 1 tablet (20 mEq total) by mouth 2 (two) times daily.    Past medical history, social, surgical and family history all reviewed in electronic medical record.  No pertanent information unless stated regarding to the chief complaint.   Review of Systems:  No headache, visual changes, nausea, vomiting, diarrhea, constipation, dizziness, abdominal pain, skin rash, fevers, chills, night sweats, weight loss, swollen lymph nodes, body aches, joint  swelling, muscle aches, chest pain, shortness of breath, mood changes.   Objective  Blood pressure (!) 154/96, pulse 95, height 5\' 7"  (D34-534 m), weight 271 lb (122.9 kg), SpO2 97 %. Systems examined below as of    General: No apparent distress alert and oriented x3 mood and affect normal, dressed appropriately.  HEENT: Pupils equal, extraocular movements intact  Respiratory: Patient's speak in full sentences and does not appear short of breath  Cardiovascular: No lower extremity edema, non tender, no erythema  Skin: Warm dry intact  with no signs of infection or rash on extremities or on axial skeleton.  Abdomen: Soft nontender  Neuro: Cranial nerves II through XII are intact, neurovascularly intact in all extremities with 2+ DTRs and 2+ pulses.  Lymph: No lymphadenopathy of posterior or anterior cervical chain or axillae bilaterally.  Gait normal with good balance and coordination.  MSK:  Non tender with full range of motion and good stability and symmetric strength and tone of , elbows, wrist, hip, and ankles bilaterally.  Shoulder: Right Inspection reveals no abnormalities, atrophy or asymmetry. Palpation is normal with no tenderness over AC joint or bicipital groove. ROM is full in all planes passively. Rotator cuff strength normal throughout. signs of impingement with positive Neer and Hawkin's tests, but negative empty can sign. Speeds and Yergason's tests normal. No labral pathology noted with negative Obrien's, negative clunk and good stability. Normal scapular function observed. No painful arc and no drop arm sign. No apprehension sign  Knee: Bilateral valgus deformity noted. Large thigh to calf ratio.  Tender to palpation over medial and PF joint line.  ROM full in flexion and extension and lower leg rotation. instability with valgus force.  painful patellar compression. Patellar glide with moderate crepitus. Patellar and quadriceps tendons unremarkable. Hamstring and  quadriceps strength is normal. Contralateral knee shows  After informed written and verbal consent, patient was seated on exam table. Right knee was prepped with alcohol swab and utilizing anterolateral approach, patient's right knee space was injected with 4:1  marcaine 0.5%: Kenalog 40mg /dL. Patient tolerated the procedure well without immediate complications.  After informed written and verbal consent, patient was seated on exam table. Left knee was prepped with alcohol swab and utilizing anterolateral approach, patient's left knee space was injected with 4:1  marcaine 0.5%: Kenalog 40mg /dL. Patient tolerated the procedure well without immediate complications.  After informed written and verbal consent, patient was seated on exam table. Right shoulder was prepped with alcohol swab and utilizing posterior approach, patient's right glenohumeral space was injected with 4:1  marcaine 0.5%: Kenalog 40mg /dL. Patient tolerated the procedure well without immediate complications.   Impression and Recommendations:     This case required medical decision making of moderate complexity. The above documentation has been reviewed and is accurate and complete Lyndal Pulley, DO       Note: This dictation was prepared with Dragon dictation along with smaller phrase technology. Any transcriptional errors that result from this process are unintentional.

## 2019-02-24 ENCOUNTER — Encounter: Payer: Self-pay | Admitting: Family Medicine

## 2019-02-24 DIAGNOSIS — M7551 Bursitis of right shoulder: Secondary | ICD-10-CM | POA: Insufficient documentation

## 2019-02-24 DIAGNOSIS — G4733 Obstructive sleep apnea (adult) (pediatric): Secondary | ICD-10-CM | POA: Diagnosis not present

## 2019-02-24 NOTE — Assessment & Plan Note (Signed)
Injected today.  Tolerated the procedure well.  Discussed posture and ergonomics.  Discussed with patient that this may be needing further work-up.  Follow-up in 4 weeks if not better otherwise we will follow-up again when we see his knees.

## 2019-02-24 NOTE — Assessment & Plan Note (Signed)
Repeat injection given again today.  Tolerated the procedure well.  Discussed icing regimen and home exercise.  Patient wants to continue with injections every 10 weeks.  Wants to avoid any surgical intervention.  Follow-up again in 10 weeks

## 2019-02-25 ENCOUNTER — Telehealth: Payer: Self-pay

## 2019-02-25 ENCOUNTER — Encounter: Payer: Self-pay | Admitting: Internal Medicine

## 2019-02-25 ENCOUNTER — Ambulatory Visit (HOSPITAL_COMMUNITY): Payer: BC Managed Care – PPO | Attending: Cardiology

## 2019-02-25 ENCOUNTER — Ambulatory Visit (INDEPENDENT_AMBULATORY_CARE_PROVIDER_SITE_OTHER): Payer: BC Managed Care – PPO | Admitting: Internal Medicine

## 2019-02-25 ENCOUNTER — Other Ambulatory Visit: Payer: Self-pay

## 2019-02-25 VITALS — BP 154/100 | HR 84

## 2019-02-25 DIAGNOSIS — R9430 Abnormal result of cardiovascular function study, unspecified: Secondary | ICD-10-CM

## 2019-02-25 DIAGNOSIS — R0789 Other chest pain: Secondary | ICD-10-CM | POA: Diagnosis not present

## 2019-02-25 DIAGNOSIS — R079 Chest pain, unspecified: Secondary | ICD-10-CM

## 2019-02-25 NOTE — Telephone Encounter (Signed)
Pt came in for his Echo today ordered by his PCP..he was complaining of chest discomfort while he was in our lobby..   Pt taken to Echo and says he has been having this discomfort on the left side of his chest for "years"... he denies worsening with movement.. he says he has been having SOB associated with it.   Pt reports the pain a 6 on a 1-10 scale..   BP 154/100, HR 52 he says he has not had his BP meds today.   EKG done... NSR  Spoke with the DOD Dr. Cristopher Peru and he will see the pt after his Echo today.

## 2019-02-25 NOTE — Progress Notes (Signed)
HPI [name] has been seen by me today for an unscheduled visit because he had chest pain while undergoing a 2D echo. His history is notable for a longstanding h/o atypical chest pain, a normal cath 12 years ago, and a low risk myoview a few months ago. He has been prescribed acid suppression meds but is non-compliant. His chest pain is not associated with exertion. He had an ECG which showed no acute STT changes and his echo looks to be normal to me with no focal wall motion abnormalities during ongoing chest pain.  Allergies  Allergen Reactions  . Tramadol Other (See Comments)    insomnia     Current Outpatient Medications  Medication Sig Dispense Refill  . albuterol (PROVENTIL HFA;VENTOLIN HFA) 108 (90 Base) MCG/ACT inhaler Inhale 2 puffs into the lungs every 6 (six) hours as needed for wheezing or shortness of breath. 1 Inhaler 6  . Cholecalciferol 50 MCG (2000 UT) TABS Take 2 tablets (4,000 Units total) by mouth daily. 180 tablet 0  . fluticasone (FLONASE) 50 MCG/ACT nasal spray Place 1 spray into both nostrils daily.    . indapamide (LOZOL) 1.25 MG tablet Take 1 tablet (1.25 mg total) by mouth daily. 90 tablet 0  . Multiple Vitamin (MULTIVITAMIN WITH MINERALS) TABS tablet Take 1 tablet by mouth daily.    . pantoprazole (PROTONIX) 40 MG tablet Take 1 tablet (40 mg total) by mouth daily. 90 tablet 1  . potassium chloride SA (K-DUR) 20 MEQ tablet Take 1 tablet (20 mEq total) by mouth 2 (two) times daily. 180 tablet 1  . rivaroxaban (XARELTO) 10 MG TABS tablet Take 1 tablet (10 mg total) by mouth daily. 90 tablet 1  . sildenafil (REVATIO) 20 MG tablet TAKE THREE TABLETS BY MOUTH EVERY DAY AS NEEDED (Patient taking differently: Take 60 mg by mouth daily as needed. ) 50 tablet 5   No current facility-administered medications for this visit.      Past Medical History:  Diagnosis Date  . Anxiety   . Arthritis    knees - no med  . Cluster headache    Hx - resolved per  patient  . Depression   . DVT of lower extremity (deep venous thrombosis) ([place]) 6/26-28/2013   Xarelto - resolved - history  . HTN (hypertension)    currently no meds  . Irritability and anger    PMH of  . Kidney stone    passed stone, no surgery required.  . Rotator cuff arthropathy 2007   right  . Saddle pulmonary embolus ([place]) 6/26-28/2013   post orthopedic surgery - resolved  . Sleep apnea 5/15   moderate OSA-has cpap but does not use it    ROS:   All systems reviewed and negative except as noted in the HPI.   Past Surgical History:  Procedure Laterality Date  . arthroscopic knee surgery  10/09/2011   Dr Ninfa Linden  . CARDIAC CATHETERIZATION  2007   LVdysfunction; normal coronaries  . KNEE ARTHROSCOPY WITH MEDIAL MENISECTOMY Left 09/20/2014   medial menisectomy chondroplastytella medial plica excision  ;  Surgeon: Dorna Leitz, MD;  Location: [facility];  Service: Orthopedics;  Laterality: Left;  . ROTATOR CUFF REPAIR  2007   right  . UPPER GASTROINTESTINAL ENDOSCOPY  2007   normal  . WISDOM TOOTH EXTRACTION       Family History  Problem Relation Age of Onset  . Cancer Mother        Mesothelioma   .  Diabetes Brother   . Hypertension Brother   . Stroke Brother 49  . Heart attack Brother 38  . Diabetes Maternal Aunt   . Diabetes Maternal Grandmother   . Heart disease Maternal Grandmother   . Hypertension Maternal Grandmother   . Stroke Maternal Grandmother        in 44s  . Deep vein thrombosis Maternal Grandmother   . Stroke Maternal Grandfather        > 55  . Migraines Brother   . Heart attack Brother 100  . Colon cancer Neg Hx   . Esophageal cancer Neg Hx   . Rectal cancer Neg Hx   . Stomach cancer Neg Hx   . Liver cancer Neg Hx   . Pancreatic cancer Neg Hx      Social History   Socioeconomic History  . Marital status: Married    Spouse name: Not on file  . Number of children: 6  . Years of education: 29  . Highest education level:  Not on file  Occupational History  . Occupation: Glass blower/designer   . Occupation: Programmer, systems  Social Needs  . Financial resource strain: Not on file  . Food insecurity    Worry: Not on file    Inability: Not on file  . Transportation needs    Medical: Not on file    Non-medical: Not on file  Tobacco Use  . Smoking status: Former Smoker    Types: Cigars  . Smokeless tobacco: Never Used  . Tobacco comment: every 3-4 months  Substance and Sexual Activity  . Alcohol use: Yes    Alcohol/week: 0.0 standard drinks    Comment: drinks occasionally  . Drug use: No  . Sexual activity: Not on file  Lifestyle  . Physical activity    Days per week: Not on file    Minutes per session: Not on file  . Stress: Not on file  Relationships  . Social Herbalist on phone: Not on file    Gets together: Not on file    Attends religious service: Not on file    Active member of club or organization: Not on file    Attends meetings of clubs or organizations: Not on file    Relationship status: Not on file  . Intimate partner violence    Fear of current or ex partner: Not on file    Emotionally abused: Not on file    Physically abused: Not on file    Forced sexual activity: Not on file  Other Topics Concern  . Not on file  Social History Narrative   Fun: Exercise     BP (!) 154/100   Pulse 84   Physical Exam:  Well appearing NAD HEENT: Unremarkable Neck:  No JVD, no thyromegally Lymphatics:  No adenopathy Back:  No CVA tenderness Lungs:  Clear with no wheezes HEART:  Regular rate rhythm, no murmurs, no rubs, no clicks Abd:  soft, positive bowel sounds, no organomegally, no rebound, no guarding Ext:  2 plus pulses, no edema, no cyanosis, no clubbing Skin:  No rashes no nodules Neuro:  CN II through XII intact, motor grossly intact  EKG - nsr with non-specific T wave abnormality.   Assess/Plan: 1. Chest pain - he has non-cardiac chest pain and his ECG and echo  are reassuring. I have encouraged him to obtain and to take his acid suppression medications.  2. HTN - his blood pressure is up today but he  appears very anxious. I have tried to reassure him and encouraged him to do better with his diet.  Mikle Bosworth.D.

## 2019-03-10 ENCOUNTER — Other Ambulatory Visit: Payer: Self-pay

## 2019-03-10 ENCOUNTER — Ambulatory Visit (INDEPENDENT_AMBULATORY_CARE_PROVIDER_SITE_OTHER): Payer: BC Managed Care – PPO | Admitting: Podiatry

## 2019-03-10 ENCOUNTER — Ambulatory Visit (INDEPENDENT_AMBULATORY_CARE_PROVIDER_SITE_OTHER): Payer: BC Managed Care – PPO

## 2019-03-10 DIAGNOSIS — M722 Plantar fascial fibromatosis: Secondary | ICD-10-CM

## 2019-03-10 MED ORDER — METHYLPREDNISOLONE 4 MG PO TBPK: ORAL_TABLET | ORAL | 0 refills | Status: DC

## 2019-03-10 MED ORDER — MELOXICAM 15 MG PO TABS: 15.0000 mg | ORAL_TABLET | Freq: Every day | ORAL | 3 refills | Status: DC

## 2019-03-10 NOTE — Progress Notes (Signed)
Subjective:  Patient ID: [name], male    DOB: 07-Jul-1965,  MRN: [id number] HPI Chief Complaint  Patient presents with  . Foot Pain    Rt bottom heel pain x 1 mo; 8-10/10 sharp constant pains -pt deneis injury -w/ swelling -pt deneis redness/wamrth -worse form rsting to standing or ffirst steps Tx: icing    53 y.o. male presents with the above complaint.   ROS: Denies fever chills nausea vomiting muscle aches pains calf pain back pain chest pain shortness of breath.  Past Medical History:  Diagnosis Date  . Anxiety   . Arthritis    knees - no med  . Cluster headache    Hx - resolved per patient  . Depression   . DVT of lower extremity (deep venous thrombosis) ([place]) 6/26-28/2013   Xarelto - resolved - history  . HTN (hypertension)    currently no meds  . Irritability and anger    PMH of  . Kidney stone    passed stone, no surgery required.  . Rotator cuff arthropathy 2007   right  . Saddle pulmonary embolus ([place]) 6/26-28/2013   post orthopedic surgery - resolved  . Sleep apnea 5/15   moderate OSA-has cpap but does not use it   Past Surgical History:  Procedure Laterality Date  . arthroscopic knee surgery  10/09/2011   Dr Ninfa Linden  . CARDIAC CATHETERIZATION  2007   LVdysfunction; normal coronaries  . KNEE ARTHROSCOPY WITH MEDIAL MENISECTOMY Left 09/20/2014   medial menisectomy chondroplastytella medial plica excision  ;  Surgeon: Dorna Leitz, MD;  Location: [facility];  Service: Orthopedics;  Laterality: Left;  . ROTATOR CUFF REPAIR  2007   right  . UPPER GASTROINTESTINAL ENDOSCOPY  2007   normal  . WISDOM TOOTH EXTRACTION      Current Outpatient Medications:  .  albuterol (PROVENTIL HFA;VENTOLIN HFA) 108 (90 Base) MCG/ACT inhaler, Inhale 2 puffs into the lungs every 6 (six) hours as needed for wheezing or shortness of breath., Disp: 1 Inhaler, Rfl: 6 .  Cholecalciferol 50 MCG (2000 UT) TABS, Take 2 tablets (4,000 Units total) by mouth daily.,  Disp: 180 tablet, Rfl: 0 .  fluticasone (FLONASE) 50 MCG/ACT nasal spray, Place 1 spray into both nostrils daily., Disp: , Rfl:  .  indapamide (LOZOL) 1.25 MG tablet, Take 1 tablet (1.25 mg total) by mouth daily., Disp: 90 tablet, Rfl: 0 .  meloxicam (MOBIC) 15 MG tablet, Take 1 tablet (15 mg total) by mouth daily., Disp: 30 tablet, Rfl: 3 .  methylPREDNISolone (MEDROL DOSEPAK) 4 MG TBPK tablet, 6 day dose pack - take as directed, Disp: 21 tablet, Rfl: 0 .  Multiple Vitamin (MULTIVITAMIN WITH MINERALS) TABS tablet, Take 1 tablet by mouth daily., Disp: , Rfl:  .  pantoprazole (PROTONIX) 40 MG tablet, Take 1 tablet (40 mg total) by mouth daily., Disp: 90 tablet, Rfl: 1 .  potassium chloride SA (K-DUR) 20 MEQ tablet, Take 1 tablet (20 mEq total) by mouth 2 (two) times daily., Disp: 180 tablet, Rfl: 1 .  rivaroxaban (XARELTO) 10 MG TABS tablet, Take 1 tablet (10 mg total) by mouth daily., Disp: 90 tablet, Rfl: 1 .  sildenafil (REVATIO) 20 MG tablet, TAKE THREE TABLETS BY MOUTH EVERY DAY AS NEEDED (Patient taking differently: Take 60 mg by mouth daily as needed. ), Disp: 50 tablet, Rfl: 5  Allergies  Allergen Reactions  . Tramadol Other (See Comments)    insomnia   Review of Systems Objective:  There  were no vitals filed for this visit.  General: Well developed, nourished, in no acute distress, alert and oriented x3   Dermatological: Skin is warm, dry and supple bilateral. Nails x 10 are well maintained; remaining integument appears unremarkable at this time. There are no open sores, no preulcerative lesions, no rash or signs of infection present.  Vascular: Dorsalis Pedis artery and Posterior Tibial artery pedal pulses are 2/4 bilateral with immedate capillary fill time. Pedal hair growth present. No varicosities and no lower extremity edema present bilateral.   Neruologic: Grossly intact via light touch bilateral. Vibratory intact via tuning fork bilateral. Protective threshold with Semmes  Wienstein monofilament intact to all pedal sites bilateral. Patellar and Achilles deep tendon reflexes 2+ bilateral. No Babinski or clonus noted bilateral.   Musculoskeletal: No gross boney pedal deformities bilateral. No pain, crepitus, or limitation noted with foot and ankle range of motion bilateral. Muscular strength 5/5 in all groups tested bilateral.  Severe pain on palpation medial Cokato tubercle of the right heel.  Gait: Unassisted, Nonantalgic.    Radiographs:  Radiographs taken today demonstrate pes planus with soft tissue increase in density plantar fashion calcaneal insertion site of the right heel.  Assessment & Plan:   Assessment: Plantar fasciitis right heel.  Pes planus right heel.  Plan: Discussed etiology pathology conservative or surgical therapies.  At this point start him on a Medrol Dosepak to be followed by meloxicam.  Plantar fascial brace followed by a night splint.  Discussed appropriate shoe gear stretching exercise ice therapy and shoe gear modifications.  Also injected his right heel today with 20 mg Kenalog 5 mg Marcaine point maximal tenderness.  He tolerated procedure well without complications.  Follow-up with his 1 month.     Max T. Haughton, [place]

## 2019-03-10 NOTE — Patient Instructions (Signed)

## 2019-03-26 DIAGNOSIS — G4733 Obstructive sleep apnea (adult) (pediatric): Secondary | ICD-10-CM | POA: Diagnosis not present

## 2019-04-05 ENCOUNTER — Other Ambulatory Visit: Payer: Self-pay

## 2019-04-05 ENCOUNTER — Encounter: Payer: Self-pay | Admitting: Pulmonary Disease

## 2019-04-05 ENCOUNTER — Ambulatory Visit (INDEPENDENT_AMBULATORY_CARE_PROVIDER_SITE_OTHER): Payer: BC Managed Care – PPO | Admitting: Pulmonary Disease

## 2019-04-05 VITALS — BP 142/108 | HR 83 | Temp 97.3°F | Ht 66.0 in | Wt 275.6 lb

## 2019-04-05 DIAGNOSIS — G4733 Obstructive sleep apnea (adult) (pediatric): Secondary | ICD-10-CM | POA: Diagnosis not present

## 2019-04-05 DIAGNOSIS — R0602 Shortness of breath: Secondary | ICD-10-CM | POA: Diagnosis not present

## 2019-04-05 DIAGNOSIS — Z86711 Personal history of pulmonary embolism: Secondary | ICD-10-CM | POA: Diagnosis not present

## 2019-04-05 LAB — D-DIMER, QUANTITATIVE: D-Dimer, Quant: 0.85 mcg/mL FEU — ABNORMAL HIGH (ref <0.50)

## 2019-04-05 NOTE — Progress Notes (Signed)
   Subjective:    Patient ID: [name], male    DOB: 05/24/1966, 53 y.o.   MRN: [id number]  HPI  53 year old for follow-up of moderate OSA maintained on CPAP with nasal pillows. He also has a chronic cough attributed to GERD H/o saddle PE 2013  Chief Complaint  Patient presents with  . Follow-up    He reports his mask has been leaking alot. DME [employer].    He complains of dyspnea, cough is improved. He has intermittent chest pain, he had an ED visit on 9/1 which I reviewed, EKG was normal troponins were negative. He has a history of PE and he has had repeated CT scans in the past, last such CT angiogram was 10/19 which was negative for pulmonary embolism. For some reason he is off the Xarelto now although this is still listed on his medications.  He does have mild chronic pedal edema  Download was reviewed which shows excellent control of events on average 12.5 cm on auto settings 7 to 20 cm and maximum 14 cm and acceptable compliance about 5 hours every night with minimal leak  Significant tests/ events reviewed  HST 09/2013: 24/hr PFTs 01/2017 normal 03/2018 CT angiogram normal appearance of lungs  Review of Systems neg for any significant sore throat, dysphagia, itching, sneezing, nasal congestion or excess/ purulent secretions, fever, chills, sweats, unintended wt loss, pleuritic or exertional cp, hempoptysis, orthopnea pnd or change in chronic leg swelling. Also denies presyncope, palpitations, heartburn, abdominal pain, nausea, vomiting, diarrhea or change in bowel or urinary habits, dysuria,hematuria, rash, arthralgias, visual complaints, headache, numbness weakness or ataxia.     Objective:   Physical Exam   Gen. Pleasant, obese, in no distress ENT - no lesions, no post nasal drip Neck: No JVD, no thyromegaly, no carotid bruits Lungs: no use of accessory muscles, no dullness to percussion, decreased without rales or rhonchi  Cardiovascular: Rhythm regular, heart  sounds  normal, no murmurs or gallops, no peripheral edema Musculoskeletal: No deformities, no cyanosis or clubbing , no tremors         Assessment & Plan:

## 2019-04-05 NOTE — Patient Instructions (Signed)
Blood work today. Change auto CPAP settings to 10 to 15 cm and check download in 1 month

## 2019-04-05 NOTE — Assessment & Plan Note (Addendum)
For his persistent dyspnea and given his prior history of saddle PE, we are obligated to rule out PE. For some reason the Xarelto 10 mg has fallen off his medication list We will check D-dimer and if this is low then reassuring but if this is high then we will proceed with CT angiogram

## 2019-04-05 NOTE — Assessment & Plan Note (Signed)
CPAP seems to work well when used. We will tighten auto CPAP settings to 10 to 15 cm, average pressure is 12.5 cm.  We will give a sample of nasal pillows will hopefully this should help him be more comfortable  Weight loss encouraged, compliance with goal of at least 4-6 hrs every night is the expectation. Advised against medications with sedative side effects Cautioned against driving when sleepy - understanding that sleepiness will vary on a day to day basis

## 2019-04-06 ENCOUNTER — Other Ambulatory Visit: Payer: Self-pay | Admitting: General Surgery

## 2019-04-06 DIAGNOSIS — R0602 Shortness of breath: Secondary | ICD-10-CM

## 2019-04-06 DIAGNOSIS — R7989 Other specified abnormal findings of blood chemistry: Secondary | ICD-10-CM

## 2019-04-06 DIAGNOSIS — Z86711 Personal history of pulmonary embolism: Secondary | ICD-10-CM

## 2019-04-07 ENCOUNTER — Inpatient Hospital Stay: Admission: RE | Admit: 2019-04-07 | Payer: BC Managed Care – PPO | Source: Ambulatory Visit

## 2019-04-07 ENCOUNTER — Telehealth: Payer: Self-pay | Admitting: Pulmonary Disease

## 2019-04-07 ENCOUNTER — Other Ambulatory Visit: Payer: Self-pay

## 2019-04-07 ENCOUNTER — Ambulatory Visit (INDEPENDENT_AMBULATORY_CARE_PROVIDER_SITE_OTHER): Payer: BC Managed Care – PPO | Admitting: Podiatry

## 2019-04-07 ENCOUNTER — Other Ambulatory Visit: Payer: Self-pay | Admitting: General Surgery

## 2019-04-07 DIAGNOSIS — M722 Plantar fascial fibromatosis: Secondary | ICD-10-CM

## 2019-04-07 DIAGNOSIS — R0602 Shortness of breath: Secondary | ICD-10-CM

## 2019-04-08 NOTE — Telephone Encounter (Signed)
I have called the wife and have transferred her over to [place] to resc

## 2019-04-09 ENCOUNTER — Encounter: Payer: Self-pay | Admitting: Podiatry

## 2019-04-09 NOTE — Progress Notes (Signed)
He presents today for follow-up of plantar fasciitis to the right foot states the injection did not help as much as I hope it would.  He states that he needs a new plantar fascial braces although it was lost he has bought some new supportive type shoes.  Objective: Vital signs are stable he is alert and oriented x3.  Pulses are palpable.  Still has pain on palpation medial calcaneal tubercle of the right heel.  Assessment: Pain on palpation of the heels indicative of plantar fasciitis right.  Plan: I reinjected the foot today placed him in a plantar fascial brace had and casted for orthotics and I will follow-up with him in a month

## 2019-04-15 ENCOUNTER — Other Ambulatory Visit: Payer: BC Managed Care – PPO

## 2019-04-22 ENCOUNTER — Ambulatory Visit
Admission: RE | Admit: 2019-04-22 | Discharge: 2019-04-22 | Disposition: A | Discharge status: Home / Self Care | Payer: BC Managed Care – PPO | Source: Ambulatory Visit | Attending: Pulmonary Disease | Admitting: Pulmonary Disease

## 2019-04-22 ENCOUNTER — Other Ambulatory Visit: Payer: Self-pay

## 2019-04-22 DIAGNOSIS — I2510 Atherosclerotic heart disease of native coronary artery without angina pectoris: Secondary | ICD-10-CM | POA: Diagnosis not present

## 2019-04-22 DIAGNOSIS — R0602 Shortness of breath: Secondary | ICD-10-CM

## 2019-04-22 DIAGNOSIS — R791 Abnormal coagulation profile: Secondary | ICD-10-CM | POA: Diagnosis not present

## 2019-04-22 DIAGNOSIS — N281 Cyst of kidney, acquired: Secondary | ICD-10-CM | POA: Diagnosis not present

## 2019-04-22 MED ORDER — IOPAMIDOL (ISOVUE-370) INJECTION 76%
75.0000 mL | Freq: Once | INTRAVENOUS | Status: AC | PRN
  Administered 2019-04-22: 75 mL via INTRAVENOUS

## 2019-04-26 ENCOUNTER — Telehealth: Payer: Self-pay | Admitting: Pulmonary Disease

## 2019-04-26 DIAGNOSIS — G4733 Obstructive sleep apnea (adult) (pediatric): Secondary | ICD-10-CM | POA: Diagnosis not present

## 2019-04-26 NOTE — Telephone Encounter (Signed)
Patient called office, confirmed DOB, requesting CT results:  Notes recorded by Rigoberto Noel, MD on 04/23/2019 at 11:06 AM EST  No evidence of blood clot or other cause of shortness of breath identified.   Given CT results per RA. Voiced understanding.   Pt states he read on mychart that he had a cist on his kidney. I made him aware I would route the message to his PCP.   Voiced understanding. Will route message to Dr. Elsworth Soho as Juluis Rainier that I sent to PCP.   Nothing further needed at this time.

## 2019-04-26 NOTE — Telephone Encounter (Signed)
Per DPR, left detailed message for patient regarding CT results. And to call back if his SOB has not resolved, or if there are additional questions.

## 2019-04-27 ENCOUNTER — Telehealth: Payer: Self-pay

## 2019-04-27 NOTE — Telephone Encounter (Signed)
Copied from [place] [id number]. Topic: General - Inquiry >> Apr 26, 2019  4:33 PM [name] wrote: Reason for CRM: pt called in stated he has ct done with Dr Elsworth Soho and they found cyst on his kidney and they sent it back over to DR Ronnald Ramp.  He would like to know what is going on with that and what it is?  He would like [name] call back  Best number  [phone]

## 2019-04-27 NOTE — Telephone Encounter (Signed)
Pt informed and stated understanding

## 2019-04-27 NOTE — Telephone Encounter (Signed)
The impression states: Bosniak 1 cysts of the right Kidney.  Is there anything that needs to be done about the cyst.

## 2019-04-27 NOTE — Telephone Encounter (Signed)
Benign cyst No concern  TJ

## 2019-05-03 ENCOUNTER — Other Ambulatory Visit: Payer: Self-pay

## 2019-05-03 ENCOUNTER — Encounter: Payer: Self-pay | Admitting: Family Medicine

## 2019-05-03 ENCOUNTER — Ambulatory Visit (INDEPENDENT_AMBULATORY_CARE_PROVIDER_SITE_OTHER): Payer: BC Managed Care – PPO | Admitting: Family Medicine

## 2019-05-03 ENCOUNTER — Ambulatory Visit: Payer: Self-pay

## 2019-05-03 VITALS — BP 160/100 | HR 86 | Ht 66.0 in | Wt 277.0 lb

## 2019-05-03 DIAGNOSIS — M25511 Pain in right shoulder: Secondary | ICD-10-CM

## 2019-05-03 DIAGNOSIS — M19011 Primary osteoarthritis, right shoulder: Secondary | ICD-10-CM

## 2019-05-03 DIAGNOSIS — M7551 Bursitis of right shoulder: Secondary | ICD-10-CM | POA: Diagnosis not present

## 2019-05-03 DIAGNOSIS — G8929 Other chronic pain: Secondary | ICD-10-CM

## 2019-05-03 DIAGNOSIS — M19019 Primary osteoarthritis, unspecified shoulder: Secondary | ICD-10-CM | POA: Insufficient documentation

## 2019-05-03 NOTE — Assessment & Plan Note (Signed)
Patient does have AC arthropathy of the right shoulder.  Does have some swelling noted.  Patient wants to avoid injection and will start with topical anti-inflammatories, discussed home exercises and work with Product/process development scientist.  Patient increase activity as tolerated.  Follow-up again in 4 to 8 weeks

## 2019-05-03 NOTE — Patient Instructions (Signed)
65 mg Iron with 500 mg vitamin C Exercise 3 times a week See me again in 5 weeks

## 2019-05-03 NOTE — Progress Notes (Signed)
[name] [address], [place] [address] Phone: [phone] Subjective:   This visit occurred during the SARS-CoV-2 public health emergency.  Safety protocols were in place, including screening questions prior to the visit, additional usage of staff PPE, and extensive cleaning of exam room while observing appropriate contact time as indicated for disinfecting solutions.    CC: Shoulder pain   [id number]     Update 05/03/2019 [name] is a 53 y.o. male coming in with complaint of right shoulder pain. Patient states the shoulder is painful. The shoulder feels more painful at the [facility] joint.  Pain with overhead activity. Pain with reaching across body. Can wake him up at night      Past Medical History:  Diagnosis Date  . Anxiety   . Arthritis    knees - no med  . Cluster headache    Hx - resolved per patient  . Depression   . DVT of lower extremity (deep venous thrombosis) ([place]) 6/26-28/2013   Xarelto - resolved - history  . HTN (hypertension)    currently no meds  . Irritability and anger    PMH of  . Kidney stone    passed stone, no surgery required.  . Rotator cuff arthropathy 2007   right  . Saddle pulmonary embolus ([place]) 6/26-28/2013   post orthopedic surgery - resolved  . Sleep apnea 5/15   moderate OSA-has cpap but does not use it   Past Surgical History:  Procedure Laterality Date  . arthroscopic knee surgery  10/09/2011   Dr Ninfa Linden  . CARDIAC CATHETERIZATION  2007   LVdysfunction; normal coronaries  . KNEE ARTHROSCOPY WITH MEDIAL MENISECTOMY Left 09/20/2014   medial menisectomy chondroplastytella medial plica excision  ;  Surgeon: Dorna Leitz, MD;  Location: [facility];  Service: Orthopedics;  Laterality: Left;  . ROTATOR CUFF REPAIR  2007   right  . UPPER GASTROINTESTINAL ENDOSCOPY  2007   normal  . WISDOM TOOTH EXTRACTION     Social History   Socioeconomic History  . Marital status:  Married    Spouse name: Not on file  . Number of children: 6  . Years of education: 66  . Highest education level: Not on file  Occupational History  . Occupation: Glass blower/designer   . Occupation: Programmer, systems  Social Needs  . Financial resource strain: Not on file  . Food insecurity    Worry: Not on file    Inability: Not on file  . Transportation needs    Medical: Not on file    Non-medical: Not on file  Tobacco Use  . Smoking status: Former Smoker    Types: Cigars  . Smokeless tobacco: Never Used  . Tobacco comment: every 3-4 months  Substance and Sexual Activity  . Alcohol use: Yes    Alcohol/week: 0.0 standard drinks    Comment: drinks occasionally  . Drug use: No  . Sexual activity: Not on file  Lifestyle  . Physical activity    Days per week: Not on file    Minutes per session: Not on file  . Stress: Not on file  Relationships  . Social Herbalist on phone: Not on file    Gets together: Not on file    Attends religious service: Not on file    Active member of club or organization: Not on file    Attends meetings of clubs or organizations: Not on file  Relationship status: Not on file  Other Topics Concern  . Not on file  Social History Narrative   Fun: Exercise   Allergies  Allergen Reactions  . Tramadol Other (See Comments)    insomnia   Family History  Problem Relation Age of Onset  . Cancer Mother        Mesothelioma   . Diabetes Brother   . Hypertension Brother   . Stroke Brother 44  . Heart attack Brother 31  . Diabetes Maternal Aunt   . Diabetes Maternal Grandmother   . Heart disease Maternal Grandmother   . Hypertension Maternal Grandmother   . Stroke Maternal Grandmother        in 46s  . Deep vein thrombosis Maternal Grandmother   . Stroke Maternal Grandfather        > 55  . Migraines Brother   . Heart attack Brother 18  . Colon cancer Neg Hx   . Esophageal cancer Neg Hx   . Rectal cancer Neg Hx   . Stomach cancer  Neg Hx   . Liver cancer Neg Hx   . Pancreatic cancer Neg Hx     Current Outpatient Medications (Endocrine & Metabolic):  .  methylPREDNISolone (MEDROL DOSEPAK) 4 MG TBPK tablet, 6 day dose pack - take as directed  Current Outpatient Medications (Cardiovascular):  .  indapamide (LOZOL) 1.25 MG tablet, Take 1 tablet (1.25 mg total) by mouth daily. .  sildenafil (REVATIO) 20 MG tablet, TAKE THREE TABLETS BY MOUTH EVERY DAY AS NEEDED (Patient taking differently: Take 60 mg by mouth daily as needed. )  Current Outpatient Medications (Respiratory):  .  albuterol (PROVENTIL HFA;VENTOLIN HFA) 108 (90 Base) MCG/ACT inhaler, Inhale 2 puffs into the lungs every 6 (six) hours as needed for wheezing or shortness of breath. .  fluticasone (FLONASE) 50 MCG/ACT nasal spray, Place 1 spray into both nostrils daily.  Current Outpatient Medications (Analgesics):  .  meloxicam (MOBIC) 15 MG tablet, Take 1 tablet (15 mg total) by mouth daily.  Current Outpatient Medications (Hematological):  .  rivaroxaban (XARELTO) 10 MG TABS tablet, Take 1 tablet (10 mg total) by mouth daily.  Current Outpatient Medications (Other):  [name]  Cholecalciferol 50 MCG (2000 UT) TABS, Take 2 tablets (4,000 Units total) by mouth daily. .  Multiple Vitamin (MULTIVITAMIN WITH MINERALS) TABS tablet, Take 1 tablet by mouth daily. .  pantoprazole (PROTONIX) 40 MG tablet, Take 1 tablet (40 mg total) by mouth daily. .  potassium chloride SA (K-DUR) 20 MEQ tablet, Take 1 tablet (20 mEq total) by mouth 2 (two) times daily.    Past medical history, social, surgical and family history all reviewed in electronic medical record.  No pertanent information unless stated regarding to the chief complaint.   Review of Systems:  No headache, visual changes, nausea, vomiting, diarrhea, constipation, dizziness, abdominal pain, skin rash, fevers, chills, night sweats, weight loss, swollen lymph nodes, body aches, joint swelling, muscle aches, chest  pain, shortness of breath, mood changes.   Objective  Blood pressure (!) 160/100, pulse 86, height 5\' 6"  (1.676 m), weight 277 lb (125.6 kg), SpO2 96 %.   General: No apparent distress alert and oriented x3 mood and affect normal, dressed appropriately.  HEENT: Pupils equal, extraocular movements intact  Respiratory: Patient's speak in full sentences and does not appear short of breath  Cardiovascular: No lower extremity edema, non tender, no erythema  Skin: Warm dry intact with no signs of infection or rash on extremities or on  axial skeleton.  Abdomen: Soft nontender  Neuro: Cranial nerves II through XII are intact, neurovascularly intact in all extremities with 2+ DTRs and 2+ pulses.  Lymph: No lymphadenopathy of posterior or anterior cervical chain or axillae bilaterally.  Gait normal with good balance and coordination.  MSK:  Non tender with full range of motion and good stability and symmetric strength and tone of , elbows, wrist, hip, and ankles bilaterally.  Right shoulder positive impingement with neer and hawkins, postive cross over. 4/5 strength of RTC. Mild loss ROM of 5 degrees in external and internal ROM.    Ltd [place] of right shoulder shows Subacromial bursitis severe, underlying tendonits of supraspinatus with chronic changes. AC arthropathy severe.    97110; 15 additional minutes spent for Therapeutic exercises as stated in above notes.  This included exercises focusing on stretching, strengthening, with significant focus on eccentric aspects.   Long term goals include an improvement in range of motion, strength, endurance as well as avoiding reinjury. Patient's frequency would include in 1-2 times a day, 3-5 times a week for a duration of 6-12 weeks. Shoulder Exercises that included:  Basic scapular stabilization to include adduction and depression of scapula [place]ion, focusing on proper movement and good control Internal and External rotation utilizing a theraband, with  elbow tucked at side entire time Rows with theraband given   Proper technique shown and discussed handout in great detail with ATC.  All questions were discussed and answered.     Impression and Recommendations:     This case required medical decision making of moderate complexity. The above documentation has been reviewed and is accurate and complete Lyndal Pulley, DO       Note: This dictation was prepared with Dragon dictation along with smaller phrase technology. Any transcriptional errors that result from this process are unintentional.

## 2019-05-03 NOTE — Assessment & Plan Note (Signed)
Subacromial bursitis.  Discussed icing regimen and home exercise, discussed potentially doing which consider avoid.  Discussed topical anti-inflammatories and if any worsening symptoms consider injection.  Patient wanted to avoid at this point.

## 2019-05-04 ENCOUNTER — Encounter: Payer: Self-pay | Admitting: Family Medicine

## 2019-05-09 ENCOUNTER — Encounter: Payer: Self-pay | Admitting: Internal Medicine

## 2019-05-09 ENCOUNTER — Other Ambulatory Visit (INDEPENDENT_AMBULATORY_CARE_PROVIDER_SITE_OTHER): Payer: BC Managed Care – PPO

## 2019-05-09 ENCOUNTER — Other Ambulatory Visit: Payer: Self-pay

## 2019-05-09 ENCOUNTER — Ambulatory Visit (INDEPENDENT_AMBULATORY_CARE_PROVIDER_SITE_OTHER): Payer: BC Managed Care – PPO | Admitting: Internal Medicine

## 2019-05-09 VITALS — BP 138/88 | HR 97 | Temp 98.4°F | Ht 66.0 in | Wt 276.0 lb

## 2019-05-09 DIAGNOSIS — D6851 Activated protein C resistance: Secondary | ICD-10-CM

## 2019-05-09 DIAGNOSIS — I10 Essential (primary) hypertension: Secondary | ICD-10-CM

## 2019-05-09 DIAGNOSIS — E876 Hypokalemia: Secondary | ICD-10-CM

## 2019-05-09 DIAGNOSIS — T502X5A Adverse effect of carbonic-anhydrase inhibitors, benzothiadiazides and other diuretics, initial encounter: Secondary | ICD-10-CM | POA: Diagnosis not present

## 2019-05-09 DIAGNOSIS — E559 Vitamin D deficiency, unspecified: Secondary | ICD-10-CM

## 2019-05-09 DIAGNOSIS — G4710 Hypersomnia, unspecified: Secondary | ICD-10-CM | POA: Insufficient documentation

## 2019-05-09 DIAGNOSIS — R739 Hyperglycemia, unspecified: Secondary | ICD-10-CM | POA: Diagnosis not present

## 2019-05-09 DIAGNOSIS — G4733 Obstructive sleep apnea (adult) (pediatric): Secondary | ICD-10-CM

## 2019-05-09 LAB — CBC WITH DIFFERENTIAL/PLATELET
Basophils Absolute: 0.1 10*3/uL (ref 0.0–0.1)
Basophils Relative: 1.1 % (ref 0.0–3.0)
Eosinophils Absolute: 0.3 10*3/uL (ref 0.0–0.7)
Eosinophils Relative: 2.5 % (ref 0.0–5.0)
HCT: 42 % (ref 39.0–52.0)
Hemoglobin: 13.8 g/dL (ref 13.0–17.0)
Lymphocytes Relative: 20.1 % (ref 12.0–46.0)
Lymphs Abs: 2.5 10*3/uL (ref 0.7–4.0)
MCHC: 32.8 g/dL (ref 30.0–36.0)
MCV: 88.6 fl (ref 78.0–100.0)
Monocytes Absolute: 1.1 10*3/uL — ABNORMAL HIGH (ref 0.1–1.0)
Monocytes Relative: 8.6 % (ref 3.0–12.0)
Neutro Abs: 8.5 10*3/uL — ABNORMAL HIGH (ref 1.4–7.7)
Neutrophils Relative %: 67.7 % (ref 43.0–77.0)
Platelets: 236 10*3/uL (ref 150.0–400.0)
RBC: 4.74 Mil/uL (ref 4.22–5.81)
RDW: 14 % (ref 11.5–15.5)
WBC: 12.6 10*3/uL — ABNORMAL HIGH (ref 4.0–10.5)

## 2019-05-09 LAB — BASIC METABOLIC PANEL
BUN: 15 mg/dL (ref 6–23)
CO2: 29 mEq/L (ref 19–32)
Calcium: 9 mg/dL (ref 8.4–10.5)
Chloride: 104 mEq/L (ref 96–112)
Creatinine, Ser: 1 mg/dL (ref 0.40–1.50)
GFR: 94.37 mL/min (ref >60.00)
Glucose, Bld: 94 mg/dL (ref 70–99)
Potassium: 3.8 mEq/L (ref 3.5–5.1)
Sodium: 139 mEq/L (ref 135–145)

## 2019-05-09 LAB — HEMOGLOBIN A1C: Hgb A1c MFr Bld: 6.1 % (ref 4.6–6.5)

## 2019-05-09 MED ORDER — ARMODAFINIL 150 MG PO TABS: 150.0000 mg | ORAL_TABLET | Freq: Every day | ORAL | 0 refills | Status: DC

## 2019-05-09 MED ORDER — RIVAROXABAN 10 MG PO TABS: 10.0000 mg | ORAL_TABLET | Freq: Every day | ORAL | 1 refills | Status: DC

## 2019-05-09 NOTE — Progress Notes (Signed)
Subjective:  Patient ID: [name], male    DOB: [birth year]  Age: 53 y.o. MRN: [id number]  CC: Hypertension  This visit occurred during the SARS-CoV-2 public health emergency.  Safety protocols were in place, including screening questions prior to the visit, additional usage of staff PPE, and extensive cleaning of exam room while observing appropriate contact time as indicated for disinfecting solutions.    HPI [name] presents for f/up - He complains of excessive daytime fatigue and somnolence.  He says sometimes he feels like he is about to fall asleep at the wheel.  He denies headache, blurred vision, chest pain, or shortness of breath.  He is not currently taking the DOAC.  He said he recently saw pulmonary for follow-up regarding his history of pulmonary emboli and left with the impression that he did not have to be anticoagulated.  He has a positive history of massive PE.  Outpatient Medications Prior to Visit  Medication Sig Dispense Refill  . albuterol (PROVENTIL HFA;VENTOLIN HFA) 108 (90 Base) MCG/ACT inhaler Inhale 2 puffs into the lungs every 6 (six) hours as needed for wheezing or shortness of breath. 1 Inhaler 6  . fluticasone (FLONASE) 50 MCG/ACT nasal spray Place 1 spray into both nostrils daily.    . indapamide (LOZOL) 1.25 MG tablet Take 1 tablet (1.25 mg total) by mouth daily. 90 tablet 0  . meloxicam (MOBIC) 15 MG tablet Take 1 tablet (15 mg total) by mouth daily. 30 tablet 3  . Multiple Vitamin (MULTIVITAMIN WITH MINERALS) TABS tablet Take 1 tablet by mouth daily.    . pantoprazole (PROTONIX) 40 MG tablet Take 1 tablet (40 mg total) by mouth daily. 90 tablet 1  . potassium chloride SA (K-DUR) 20 MEQ tablet Take 1 tablet (20 mEq total) by mouth 2 (two) times daily. 180 tablet 1  . sildenafil (REVATIO) 20 MG tablet TAKE THREE TABLETS BY MOUTH EVERY DAY AS NEEDED (Patient taking differently: Take 60 mg by mouth daily as needed. ) 50 tablet 5  .  Cholecalciferol 50 MCG (2000 UT) TABS Take 2 tablets (4,000 Units total) by mouth daily. 180 tablet 0  . methylPREDNISolone (MEDROL DOSEPAK) 4 MG TBPK tablet 6 day dose pack - take as directed 21 tablet 0  . rivaroxaban (XARELTO) 10 MG TABS tablet Take 1 tablet (10 mg total) by mouth daily. 90 tablet 1   No facility-administered medications prior to visit.     ROS Review of Systems  Constitutional: Positive for fatigue. Negative for diaphoresis.  HENT: Negative.   Eyes: Negative.   Respiratory: Positive for apnea. Negative for cough, shortness of breath and wheezing.   Cardiovascular: Negative for chest pain, palpitations and leg swelling.  Gastrointestinal: Negative for abdominal pain, constipation, diarrhea, nausea and vomiting.  Endocrine: Negative.   Genitourinary: Negative.  Negative for difficulty urinating.  Musculoskeletal: Negative.  Negative for arthralgias and myalgias.  Skin: Negative.  Negative for color change.  Neurological: Negative.  Negative for dizziness, weakness, light-headedness and headaches.  Hematological: Negative.  Negative for adenopathy. Does not bruise/bleed easily.  Psychiatric/Behavioral: Negative.     Objective:  BP 138/88 (BP Location: Left Arm, Patient Position: Sitting, Cuff Size: Large)   Pulse 97   Temp 98.4 F (36.9 C) (Oral)   Ht 5\' 6"  (1.676 m)   Wt 276 lb (125.2 kg)   SpO2 96%   BMI 44.55 kg/m   BP Readings from Last 3 Encounters:  05/09/19 138/88  05/03/19 (!) 160/100  04/05/19 [name])  142/108    Wt Readings from Last 3 Encounters:  05/09/19 276 lb (125.2 kg)  05/03/19 277 lb (125.6 kg)  04/05/19 275 lb 9.6 oz (125 kg)    Physical Exam Vitals signs reviewed.  Constitutional:      Appearance: Normal appearance.  HENT:     Nose: Nose normal.     Mouth/Throat:     Mouth: Mucous membranes are moist.  Eyes:     General: No scleral icterus.    Conjunctiva/sclera: Conjunctivae normal.  Neck:     Musculoskeletal: Neck supple.   Cardiovascular:     Rate and Rhythm: Normal rate and regular rhythm.     Heart sounds: Normal heart sounds, S1 normal and S2 normal. No murmur. No gallop.   Pulmonary:     Effort: Pulmonary effort is normal.     Breath sounds: Normal breath sounds. No wheezing or rales.  Abdominal:     General: Abdomen is protuberant. Bowel sounds are normal. There is no distension.     Palpations: Abdomen is soft. There is no hepatomegaly or splenomegaly.     Tenderness: There is no abdominal tenderness.  Musculoskeletal:     Right lower leg: Edema (trace) present.     Left lower leg: Edema (trace) present.  Lymphadenopathy:     Cervical: No cervical adenopathy.  Skin:    General: Skin is warm and dry.  Neurological:     General: No focal deficit present.     Mental Status: He is alert.  Psychiatric:        Mood and Affect: Mood normal.        Behavior: Behavior normal.     Lab Results  Component Value Date   WBC 12.6 (H) 05/09/2019   HGB 13.8 05/09/2019   HCT 42.0 05/09/2019   PLT 236.0 05/09/2019   GLUCOSE 94 05/09/2019   CHOL 154 11/02/2018   TRIG 84.0 11/02/2018   HDL 49.10 11/02/2018   LDLCALC 88 11/02/2018   ALT 23 02/01/2019   AST 18 02/01/2019   NA 139 05/09/2019   K 3.8 05/09/2019   CL 104 05/09/2019   CREATININE 1.00 05/09/2019   BUN 15 05/09/2019   CO2 29 05/09/2019   TSH 1.36 11/02/2018   PSA 0.37 11/02/2018   INR CANCELED 08/12/2013   HGBA1C 6.1 05/09/2019    Ct Angio Chest Pe W Or Wo Contrast  Result Date: 04/22/2019 CLINICAL DATA:  53 year old male with a history of shortness of breath, elevated D-dimer EXAM: CT ANGIOGRAPHY CHEST WITH CONTRAST TECHNIQUE: Multidetector CT imaging of the chest was performed using the standard protocol during bolus administration of intravenous contrast. Multiplanar CT image reconstructions and MIPs were obtained to evaluate the vascular anatomy. CONTRAST:  54mL ISOVUE-370 IOPAMIDOL (ISOVUE-370) INJECTION 76% COMPARISON:  03/09/2018  FINDINGS: Cardiovascular: Heart: No cardiomegaly. No pericardial fluid/thickening. Calcifications of the left anterior descending coronary artery. Aorta: Unremarkable course, caliber, contour of the thoracic aorta. No aneurysm or dissection flap. No periaortic fluid. Pulmonary arteries: No central, lobar, segmental, or proximal subsegmental filling defects. Mediastinum/Nodes: No mediastinal adenopathy. Unremarkable appearance of the thoracic esophagus. Unremarkable appearance of the thoracic inlet and thyroid. Lungs/Pleura: Central airways are clear. No pleural effusion. No confluent airspace disease. No pneumothorax. Upper Abdomen: No acute finding of the upper abdomen. Redemonstration of what appear to be Bosniak 1 cysts of the right kidney. Musculoskeletal: No acute displaced fracture. Degenerative changes of the spine. Review of the MIP images confirms the above findings. IMPRESSION: Negative for pulmonary emboli.  Coronary artery disease. Electronically Signed   By: Corrie Mckusick D.O.   On: 04/22/2019 15:50    Assessment & Plan:   Jsaon was seen today for hypertension.  Diagnoses and all orders for this visit:  Essential hypertension- His blood pressure is adequately well controlled. -     Cancel: Basic metabolic panel; Future -     CBC with Differential; Future -     Basic metabolic panel; Future  Diuretic-induced hypokalemia- His potassium level is normal now. -     Cancel: Basic metabolic panel; Future -     Basic metabolic panel; Future  Hyperglycemia- His A1c is at 6.1%.  He is prediabetic.  Medical therapy is not indicated. -     Cancel: Basic metabolic panel; Future -     Cancel: Hemoglobin A1c; Future -     Hemoglobin A1c; Future  Heterozygous factor V Leiden mutation ([place])- I recommended lifelong anticoagulation. -     rivaroxaban (XARELTO) 10 MG TABS tablet; Take 1 tablet (10 mg total) by mouth daily. -     D-dimer, quantitative; Future  Excessive somnolence disorder- I  recommended that he treat this with armodafinil. -     Armodafinil 150 MG tablet; Take 1 tablet (150 mg total) by mouth daily.  OSA (obstructive sleep apnea) -     Armodafinil 150 MG tablet; Take 1 tablet (150 mg total) by mouth daily.  Vitamin D deficiency disease -     Vitamin D 25 hydroxy; Future -     Cholecalciferol 50 MCG (2000 UT) TABS; Take 2 tablets (4,000 Units total) by mouth daily.   I have discontinued Jerrius Ricciardelli. [place]'s methylPREDNISolone. I am also having him start on Armodafinil. Additionally, I am having him maintain his multivitamin with minerals, albuterol, sildenafil, pantoprazole, indapamide, fluticasone, potassium chloride SA, meloxicam, rivaroxaban, and Cholecalciferol.  Meds ordered this encounter  Medications  . rivaroxaban (XARELTO) 10 MG TABS tablet    Sig: Take 1 tablet (10 mg total) by mouth daily.    Dispense:  90 tablet    Refill:  1  . Armodafinil 150 MG tablet    Sig: Take 1 tablet (150 mg total) by mouth daily.    Dispense:  90 tablet    Refill:  0  . Cholecalciferol 50 MCG (2000 UT) TABS    Sig: Take 2 tablets (4,000 Units total) by mouth daily.    Dispense:  180 tablet    Refill:  1     Follow-up: Return in about 3 months (around 08/07/2019).  Scarlette Calico, MD

## 2019-05-09 NOTE — Patient Instructions (Signed)

## 2019-05-10 ENCOUNTER — Other Ambulatory Visit: Payer: Self-pay | Admitting: Internal Medicine

## 2019-05-10 ENCOUNTER — Encounter: Payer: Self-pay | Admitting: Internal Medicine

## 2019-05-10 DIAGNOSIS — E876 Hypokalemia: Secondary | ICD-10-CM

## 2019-05-10 DIAGNOSIS — I10 Essential (primary) hypertension: Secondary | ICD-10-CM

## 2019-05-10 LAB — VITAMIN D 25 HYDROXY (VIT D DEFICIENCY, FRACTURES): VITD: 22.52 ng/mL — ABNORMAL LOW (ref 30.00–100.00)

## 2019-05-10 LAB — D-DIMER, QUANTITATIVE: D-Dimer, Quant: 0.68 mcg/mL FEU — ABNORMAL HIGH (ref <0.50)

## 2019-05-10 MED ORDER — CHOLECALCIFEROL 50 MCG (2000 UT) PO TABS: 2.0000 | ORAL_TABLET | Freq: Every day | ORAL | 1 refills | Status: DC

## 2019-05-10 MED ORDER — POTASSIUM CHLORIDE CRYS ER 20 MEQ PO TBCR: 20.0000 meq | EXTENDED_RELEASE_TABLET | Freq: Two times a day (BID) | ORAL | 0 refills | Status: DC

## 2019-05-10 NOTE — Telephone Encounter (Signed)
Requested Prescriptions  Pending Prescriptions Disp Refills  . potassium chloride SA (KLOR-CON) 20 MEQ tablet 180 tablet 0    Sig: Take 1 tablet (20 mEq total) by mouth 2 (two) times daily.     Endocrinology:  Minerals - Potassium Supplementation Passed - 05/10/2019  4:32 PM      Passed - K in normal range and within 360 days    Potassium  Date Value Ref Range Status  05/09/2019 3.8 3.5 - 5.1 mEq/L Final         Passed - Cr in normal range and within 360 days    Creatinine, Ser  Date Value Ref Range Status  05/09/2019 1.00 0.40 - 1.50 mg/dL Final         Passed - Valid encounter within last 12 months    Recent Outpatient Visits          Yesterday Essential hypertension   [place], Thomas L, MD   1 week ago Chronic right shoulder pain   [place], [place], DO   2 months ago Need for immunization against influenza   [place], [place], DO   3 months ago Essential hypertension   [place], MD   5 months ago Primary osteoarthritis of both [place] Bondurant, Gene Autry, DO      Future Appointments            In 1 month Tamala Julian Olevia Bowens, [place], [facility]

## 2019-05-10 NOTE — Telephone Encounter (Signed)
potassium chloride SA (K-DUR) 20 MEQ tablet   [id number] - [place], [place] [id number] (Phone) [phone] (Fax)

## 2019-05-16 ENCOUNTER — Other Ambulatory Visit: Payer: Self-pay

## 2019-05-16 ENCOUNTER — Ambulatory Visit: Payer: BC Managed Care – PPO | Admitting: Orthotics

## 2019-05-16 DIAGNOSIS — G4710 Hypersomnia, unspecified: Secondary | ICD-10-CM

## 2019-05-16 DIAGNOSIS — M722 Plantar fascial fibromatosis: Secondary | ICD-10-CM

## 2019-05-16 NOTE — Progress Notes (Signed)
Patient came in today to pick up custom made foot orthotics.  The goals were accomplished and the patient reported no dissatisfaction with said orthotics.  Patient was advised of breakin period and how to report any issues. 

## 2019-06-13 ENCOUNTER — Other Ambulatory Visit: Payer: Self-pay

## 2019-06-13 ENCOUNTER — Ambulatory Visit (INDEPENDENT_AMBULATORY_CARE_PROVIDER_SITE_OTHER): Payer: BC Managed Care – PPO | Admitting: Family Medicine

## 2019-06-13 ENCOUNTER — Encounter: Payer: Self-pay | Admitting: Family Medicine

## 2019-06-13 VITALS — BP 146/90 | HR 106 | Ht 66.0 in | Wt 272.0 lb

## 2019-06-13 DIAGNOSIS — M170 Bilateral primary osteoarthritis of knee: Secondary | ICD-10-CM

## 2019-06-13 DIAGNOSIS — M19011 Primary osteoarthritis, right shoulder: Secondary | ICD-10-CM | POA: Diagnosis not present

## 2019-06-13 DIAGNOSIS — M25511 Pain in right shoulder: Secondary | ICD-10-CM

## 2019-06-13 MED ORDER — VITAMIN D (ERGOCALCIFEROL) 1.25 MG (50000 UNIT) PO CAPS: 50000.0000 [IU] | ORAL_CAPSULE | ORAL | 0 refills | Status: DC

## 2019-06-13 NOTE — Assessment & Plan Note (Signed)
Stable.  Patient wants to avoid any type of the injections if possible.  Has had cramping intermittently with this previously.  Patient will follow up with me again in 4 to 8 weeks

## 2019-06-13 NOTE — Assessment & Plan Note (Signed)
Continues to have difficulty and does have arthritis of the acromioclavicular joint as well as subacromial bursitis.  Sent to formal physical therapy.  Patient wants to hold on any type of injection.  Patient would be more willing to actually have surgery and if we fail physical therapy will consider the advanced imaging.  Increase activity as tolerated.  Follow-up again in 4 to 8 weeks

## 2019-06-13 NOTE — Progress Notes (Signed)
[name] [address] [place] Phone: [phone] Subjective:   I [name] am serving as a Education administrator for Dr. Hulan Saas.  This visit occurred during the SARS-CoV-2 public health emergency.  Safety protocols were in place, including screening questions prior to the visit, additional usage of staff PPE, and extensive cleaning of exam room while observing appropriate contact time as indicated for disinfecting solutions.   I'm seeing this patient by the request  of:    CC: Knee pain, shoulder pain  [id number]   05/03/2019 Subacromial bursitis.  Discussed icing regimen and home exercise, discussed potentially doing which consider avoid.  Discussed topical anti-inflammatories and if any worsening symptoms consider injection.  Patient wanted to avoid at this point.  Patient does have AC arthropathy of the right shoulder.  Does have some swelling noted.  Patient wants to avoid injection and will start with topical anti-inflammatories, discussed home exercises and work with Product/process development scientist.  Patient increase activity as tolerated.  Follow-up again in 4 to 8 weeks  06/13/2019 [name] is a 54 y.o. male coming in with complaint of shoulder pain. Patient states he is still in a lot of pain. States he is not sure if he wants an injection due to cramps.  Patient is wondering what else he can do for the shoulder specifically.     Past Medical History:  Diagnosis Date  . Anxiety   . Arthritis    knees - no med  . Cluster headache    Hx - resolved per patient  . Depression   . DVT of lower extremity (deep venous thrombosis) ([place]) 6/26-28/2013   Xarelto - resolved - history  . HTN (hypertension)    currently no meds  . Irritability and anger    PMH of  . Kidney stone    passed stone, no surgery required.  . Rotator cuff arthropathy 2007   right  . Saddle pulmonary embolus ([place]) 6/26-28/2013   post orthopedic surgery -  resolved  . Sleep apnea 5/15   moderate OSA-has cpap but does not use it   Past Surgical History:  Procedure Laterality Date  . arthroscopic knee surgery  10/09/2011   Dr Ninfa Linden  . CARDIAC CATHETERIZATION  2007   LVdysfunction; normal coronaries  . KNEE ARTHROSCOPY WITH MEDIAL MENISECTOMY Left 09/20/2014   medial menisectomy chondroplastytella medial plica excision  ;  Surgeon: Dorna Leitz, MD;  Location: [facility];  Service: Orthopedics;  Laterality: Left;  . ROTATOR CUFF REPAIR  2007   right  . UPPER GASTROINTESTINAL ENDOSCOPY  2007   normal  . WISDOM TOOTH EXTRACTION     Social History   Socioeconomic History  . Marital status: Married    Spouse name: Not on file  . Number of children: 6  . Years of education: 56  . Highest education level: Not on file  Occupational History  . Occupation: Glass blower/designer   . Occupation: Programmer, systems  Tobacco Use  . Smoking status: Former Smoker    Types: Cigars  . Smokeless tobacco: Never Used  . Tobacco comment: every 3-4 months  Substance and Sexual Activity  . Alcohol use: Yes    Alcohol/week: 0.0 standard drinks    Comment: drinks occasionally  . Drug use: No  . Sexual activity: Not on file  Other Topics Concern  . Not on file  Social History Narrative   Fun: Exercise   Social Determinants of Health  Financial Resource Strain:   . Difficulty of Paying Living Expenses: Not on file  Food Insecurity:   . Worried About Charity fundraiser in the Last Year: Not on file  . Ran Out of Food in the Last Year: Not on file  Transportation Needs:   . Lack of Transportation (Medical): Not on file  . Lack of Transportation (Non-Medical): Not on file  Physical Activity:   . Days of Exercise per Week: Not on file  . Minutes of Exercise per Session: Not on file  Stress:   . Feeling of Stress : Not on file  Social Connections:   . Frequency of Communication with Friends and Family: Not on file  . Frequency of  Social Gatherings with Friends and Family: Not on file  . Attends Religious Services: Not on file  . Active Member of Clubs or Organizations: Not on file  . Attends Archivist Meetings: Not on file  . Marital Status: Not on file   Allergies  Allergen Reactions  . Tramadol Other (See Comments)    insomnia   Family History  Problem Relation Age of Onset  . Cancer Mother        Mesothelioma   . Diabetes Brother   . Hypertension Brother   . Stroke Brother 12  . Heart attack Brother 31  . Diabetes Maternal Aunt   . Diabetes Maternal Grandmother   . Heart disease Maternal Grandmother   . Hypertension Maternal Grandmother   . Stroke Maternal Grandmother        in 24s  . Deep vein thrombosis Maternal Grandmother   . Stroke Maternal Grandfather        > 55  . Migraines Brother   . Heart attack Brother 8  . Colon cancer Neg Hx   . Esophageal cancer Neg Hx   . Rectal cancer Neg Hx   . Stomach cancer Neg Hx   . Liver cancer Neg Hx   . Pancreatic cancer Neg Hx      Current Outpatient Medications (Cardiovascular):  .  indapamide (LOZOL) 1.25 MG tablet, Take 1 tablet (1.25 mg total) by mouth daily. .  sildenafil (REVATIO) 20 MG tablet, TAKE THREE TABLETS BY MOUTH EVERY DAY AS NEEDED (Patient taking differently: Take 60 mg by mouth daily as needed. )  Current Outpatient Medications (Respiratory):  .  albuterol (PROVENTIL HFA;VENTOLIN HFA) 108 (90 Base) MCG/ACT inhaler, Inhale 2 puffs into the lungs every 6 (six) hours as needed for wheezing or shortness of breath. .  fluticasone (FLONASE) 50 MCG/ACT nasal spray, Place 1 spray into both nostrils daily.  Current Outpatient Medications (Analgesics):  .  meloxicam (MOBIC) 15 MG tablet, Take 1 tablet (15 mg total) by mouth daily.  Current Outpatient Medications (Hematological):  .  rivaroxaban (XARELTO) 10 MG TABS tablet, Take 1 tablet (10 mg total) by mouth daily.  Current Outpatient Medications (Other):  [name]  Armodafinil  150 MG tablet, Take 1 tablet (150 mg total) by mouth daily. .  Cholecalciferol 50 MCG (2000 UT) TABS, Take 2 tablets (4,000 Units total) by mouth daily. .  Multiple Vitamin (MULTIVITAMIN WITH MINERALS) TABS tablet, Take 1 tablet by mouth daily. .  pantoprazole (PROTONIX) 40 MG tablet, Take 1 tablet (40 mg total) by mouth daily. .  potassium chloride SA (KLOR-CON) 20 MEQ tablet, Take 1 tablet (20 mEq total) by mouth 2 (two) times daily. .  Vitamin D, Ergocalciferol, (DRISDOL) 1.25 MG (50000 UNIT) CAPS capsule, Take 1 capsule (50,000 Units  total) by mouth every 7 (seven) days.    Past medical history, social, surgical and family history all reviewed in electronic medical record.  No pertanent information unless stated regarding to the chief complaint.   Review of Systems:  No headache, visual changes, nausea, vomiting, diarrhea, constipation, dizziness, abdominal pain, skin rash, fevers, chills, night sweats, weight loss, swollen lymph nodes, body aches, joint swelling, muscle aches, chest pain, shortness of breath, mood changes.   Objective  Blood pressure (!) 146/90, pulse (!) 106, height 5\' 6"  (1.676 m), weight 272 lb (123.4 kg), SpO2 94 %.    General: No apparent distress alert and oriented x3 mood and affect normal, dressed appropriately.  HEENT: Pupils equal, extraocular movements intact  Respiratory: Patient's speak in full sentences and does not appear short of breath  Cardiovascular: No lower extremity edema, non tender, no erythema  Skin: Warm dry intact with no signs of infection or rash on extremities or on axial skeleton.  Abdomen: Soft nontender  Neuro: Cranial nerves II through XII are intact, neurovascularly intact in all extremities with 2+ DTRs and 2+ pulses.  Lymph: No lymphadenopathy of posterior or anterior cervical chain or axillae bilaterally.  Gait antalgic MSK: Right shoulder exam shows the patient does have positive impingement noted.  Positive crossover with  tenderness over the acromioclavicular joint.  Contralateral shoulder unremarkable  Left knee exam still has significant pain with tenderness over the medial joint line.  Mild instability with valgus force.  Does like the last 5 degrees of extension and flexion.    Impression and Recommendations:     This case required medical decision making of moderate complexity. The above documentation has been reviewed and is accurate and complete Lyndal Pulley, DO       Note: This dictation was prepared with Dragon dictation along with smaller phrase technology. Any transcriptional errors that result from this process are unintentional.

## 2019-06-13 NOTE — Patient Instructions (Addendum)
Once weekly vitamin D PT [address]. See me again in 6 weeks

## 2019-06-28 ENCOUNTER — Ambulatory Visit: Payer: BC Managed Care – PPO | Attending: Family Medicine

## 2019-06-28 ENCOUNTER — Other Ambulatory Visit: Payer: Self-pay

## 2019-06-28 DIAGNOSIS — M25511 Pain in right shoulder: Secondary | ICD-10-CM | POA: Diagnosis not present

## 2019-06-28 DIAGNOSIS — M25611 Stiffness of right shoulder, not elsewhere classified: Secondary | ICD-10-CM | POA: Diagnosis not present

## 2019-06-28 DIAGNOSIS — G8929 Other chronic pain: Secondary | ICD-10-CM | POA: Diagnosis not present

## 2019-06-28 DIAGNOSIS — M7551 Bursitis of right shoulder: Secondary | ICD-10-CM | POA: Insufficient documentation

## 2019-06-28 DIAGNOSIS — R252 Cramp and spasm: Secondary | ICD-10-CM | POA: Insufficient documentation

## 2019-06-28 NOTE — Therapy (Signed)
[place], [place], [address] Phone: [phone]   Fax:  [phone]  Physical Therapy Evaluation  Patient Details  Name: [name] MRN: [id number] Date of Birth: 05-08-1966 Referring Provider (PT): Hulan Saas, DO   Encounter Date: 06/28/2019  PT End of Session - 06/28/19 1715    Visit Number  1    Number of Visits  16    Date for PT Re-Evaluation  08/19/19    Authorization Type  [employer]    PT Start Time  0415    PT Stop Time  0505    PT Time Calculation (min)  50 min    Activity Tolerance  Patient limited by pain    Behavior During Therapy  [facility] for tasks assessed/performed       Past Medical History:  Diagnosis Date  . Anxiety   . Arthritis    knees - no med  . Cluster headache    Hx - resolved per patient  . Depression   . DVT of lower extremity (deep venous thrombosis) ([place]) 6/26-28/2013   Xarelto - resolved - history  . HTN (hypertension)    currently no meds  . Irritability and anger    PMH of  . Kidney stone    passed stone, no surgery required.  . Rotator cuff arthropathy 2007   right  . Saddle pulmonary embolus ([place]) 6/26-28/2013   post orthopedic surgery - resolved  . Sleep apnea 5/15   moderate OSA-has cpap but does not use it    Past Surgical History:  Procedure Laterality Date  . arthroscopic knee surgery  10/09/2011   Dr Ninfa Linden  . CARDIAC CATHETERIZATION  2007   LVdysfunction; normal coronaries  . KNEE ARTHROSCOPY WITH MEDIAL MENISECTOMY Left 09/20/2014   medial menisectomy chondroplastytella medial plica excision  ;  Surgeon: Dorna Leitz, MD;  Location: [place];  Service: Orthopedics;  Laterality: Left;  . ROTATOR CUFF REPAIR  2007   right  . UPPER GASTROINTESTINAL ENDOSCOPY  2007   normal  . WISDOM TOOTH EXTRACTION      There were no vitals filed for this visit.   Subjective Assessment - 06/28/19 1619    Subjective  He reports  RT  shoulder pain anterior aspect. DO said severe bursitis and posssible tear.    Pain has been going on for 6 months.   No injury reported.  Exercise 4-5 reps every other day    Limitations  Lifting   dressing, work   Diagnostic tests  [place] severe OA RT AC and bursitis.    Patient Stated Goals  He wants to get shoulder healed    Currently in Pain?  Yes    Pain Score  7     Pain Location  Shoulder    Pain Orientation  Right;Anterior    Pain Descriptors / Indicators  Sharp;Aching;Dull    Pain Type  Chronic pain    Pain Onset  More than a month ago    Pain Frequency  Constant    Aggravating Factors   lifting,    reaching    Pain Relieving Factors  Icy hot , KT tape ,         [facility] PT Assessment - 06/28/19 0001      Assessment   Medical Diagnosis  RT shoulder     Referring Provider (PT)  Hulan Saas, DO    Onset Date/Surgical Date  --   6 months ago  Hand Dominance  Right    Next MD Visit  6 weeks     Prior Therapy  No      Precautions   Precautions  None      Restrictions   Weight Bearing Restrictions  No      Balance Screen   Has the patient fallen in the past 6 months  No      Prior Function   Level of Independence  Independent    Vocation  Full time employment    Nurse, 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# )      Cognition   Overall Cognitive Status  Within Functional Limits for tasks assessed      ROM / Strength   AROM / PROM / Strength  AROM;Strength      AROM   AROM Assessment Site  Shoulder    Right/Left Shoulder  Left;Right    Right Shoulder Extension  52 Degrees    Right Shoulder Flexion  85 Degrees    Right Shoulder ABduction  65 Degrees    Right Shoulder Internal Rotation  25 Degrees    Right Shoulder External Rotation  45 Degrees    Left Shoulder Extension  35 Degrees    Left Shoulder Flexion  148 Degrees    Left Shoulder ABduction  163 Degrees    Left Shoulder Internal Rotation  65 Degrees    Left Shoulder External  Rotation  99 Degrees    Left Shoulder Horizontal ABduction  14 Degrees    Left Shoulder Horizontal ADduction  97 Degrees      Strength   Overall Strength Comments  limited buy pain      Palpation   Palpation comment  tender over whole RT shoullder                 Objective measurements completed on examination: See above findings.      [place] Adult PT Treatment/Exercise - 06/28/19 0001      Modalities   Modalities  Electrical Stimulation;Moist Heat      Moist Heat Therapy   Number Minutes Moist Heat  15 Minutes    Moist Heat Location  Shoulder   R     Electrical Stimulation   Electrical Stimulation Location  RT shoulder    Electrical Stimulation Action  IFC     Electrical Stimulation Parameters  to tolerance 22 MA    Electrical Stimulation Goals  Pain      Manual Therapy   Manual Therapy  Soft tissue mobilization;Joint mobilization;Myofascial release;Scapular mobilization;Passive ROM    Soft tissue mobilization  over whole RT shoulder             PT Education - 06/28/19 1713    Education Details  POC    Person(s) Educated  Patient    Methods  Explanation    Comprehension  Verbalized understanding       PT Short Term Goals - 06/28/19 1628      PT SHORT TERM GOAL #1   Title  He will be independent with initial HEP    Time  3    Period  Weeks    Status  New      PT SHORT TERM GOAL #2   Title  He will report pain decreased 25% or mroe with general home activity    Time  3    Period  Weeks    Status  New  PT SHORT TERM GOAL #3   Title  Hew will report not having to modify dressing due to decr pain.    Time  3    Period  Weeks    Status  New        PT Long Term Goals - 06/28/19 1629      PT LONG TERM GOAL #1   Title  He will be independent with all hEP issued    Time  8    Period  Weeks    Status  New      PT LONG TERM GOAL #2   Title  He will have intermittant pain and report able to lift more at work with less pain    Time   8    Period  Weeks    Status  New      PT LONG TERM GOAL #3   Title  He will report no problems at home with lifting and moving items    Time  8    Period  Weeks    Status  New      PT LONG TERM GOAL #4   Title  He will improve ROM active to equal LT  with min to no pain to ease use of  RT arm for home and work tasks    Time  [place] - 06/28/19 1716    Clinical Impression Statement  [name] presents with chronic RT shoulder pain and stiffness limitng use  f RT arm for work, home tasks , self care, He has significant soft tissue tenderness over whole  RT shoulder.   He should make some significant improvements with skilled PT and consitent HEP  but  may have limitations due to severity and chronicity of pain.    Personal Factors and Comorbidities  Time since onset of injury/illness/exacerbation    Examination-Activity Limitations  Reach Overhead;Carry;Dressing;Lift    Examination-Participation Restrictions  --   work   Stability/Clinical Decision Making  Stable/Uncomplicated    Clinical Decision Making  Low    Rehab Potential  Good    PT Frequency  2x / week    PT Duration  8 weeks    PT Treatment/Interventions  Electrical Stimulation;Iontophoresis 4mg /ml Dexamethasone;Moist Heat;Therapeutic exercise;Patient/family education;Manual techniques;Dry needling;Passive range of motion;Taping    PT Next Visit Plan  Manual and modalities   Start HEP , isometrics, scapula ROM. tape?    Consulted and Agree with Plan of Care  Patient       Patient will benefit from skilled therapeutic intervention in order to improve the following deficits and impairments:  Pain, Decreased strength, Decreased activity tolerance, Increased muscle spasms, Impaired UE functional use, Decreased range of motion  Visit Diagnosis: Chronic right shoulder pain  Bursitis of right shoulder  Cramp and spasm  Stiffness of right shoulder, not elsewhere  classified     Problem List Patient Active Problem List   Diagnosis Date Noted  . Excessive somnolence disorder 05/09/2019  . AC (acromioclavicular) arthritis 05/03/2019  . Subacromial bursitis of right shoulder joint 05/03/2019  . Diuretic-induced hypokalemia 02/03/2019  . Ejection fraction < 50% 02/03/2019  . Vitamin D deficiency disease 11/03/2018  . PUD (peptic ulcer disease) 01/20/2018  . Renal stone 08/26/2017  . Morbid obesity ([place]) 08/25/2017  . GERD with esophagitis 08/24/2017  . Degenerative arthritis of knee, bilateral 06/08/2017  .  Venous insufficiency of both lower extremities 10/05/2015  . Routine general medical examination at a health care facility 08/21/2015  . Heterozygous factor V Leiden mutation ([place]) 08/24/2013  . OSA (obstructive sleep apnea) 08/13/2013  . Hyperglycemia 11/10/2008  . Mixed hyperlipidemia 07/06/2008  . Essential hypertension 07/06/2008  . ERECTILE DYSFUNCTION, ORGANIC 01/28/2007    Darrel Hoover  PT 06/28/2019, 5:25 PM  [facility] [address] [place], [place], [address] Phone: [phone]   Fax:  [phone]  Name: [name] MRN: [id number] Date of Birth: January 29, 1966

## 2019-07-02 ENCOUNTER — Other Ambulatory Visit: Payer: Self-pay | Admitting: Podiatry

## 2019-07-11 ENCOUNTER — Encounter: Payer: Self-pay | Admitting: Physical Therapy

## 2019-07-11 ENCOUNTER — Other Ambulatory Visit: Payer: Self-pay

## 2019-07-11 ENCOUNTER — Telehealth: Payer: Self-pay

## 2019-07-11 ENCOUNTER — Ambulatory Visit: Payer: BC Managed Care – PPO | Attending: Family Medicine | Admitting: Physical Therapy

## 2019-07-11 DIAGNOSIS — M25611 Stiffness of right shoulder, not elsewhere classified: Secondary | ICD-10-CM | POA: Insufficient documentation

## 2019-07-11 DIAGNOSIS — M7551 Bursitis of right shoulder: Secondary | ICD-10-CM | POA: Insufficient documentation

## 2019-07-11 DIAGNOSIS — G8929 Other chronic pain: Secondary | ICD-10-CM | POA: Insufficient documentation

## 2019-07-11 DIAGNOSIS — R252 Cramp and spasm: Secondary | ICD-10-CM

## 2019-07-11 DIAGNOSIS — M25511 Pain in right shoulder: Secondary | ICD-10-CM | POA: Diagnosis not present

## 2019-07-11 NOTE — Patient Instructions (Signed)

## 2019-07-11 NOTE — Telephone Encounter (Signed)
New message    Medication Requested:  Is medication on med list (if no, inform pt they may need an appointment): sildenafil (REVATIO) 20 MG tablet  Is medication a controled (yes = last OV with PCP):   Is the OV > than 4 months (yes = schedule an appt if one is not already made):   Pharmacy (Name [place]): [facility] on [address] [phone]

## 2019-07-11 NOTE — Therapy (Signed)
[place], [place], [address] Phone: [phone]   Fax:  [phone]  Physical Therapy Treatment  Patient Details  Name: [name] MRN: [id number] Date of Birth: August 20, 1965 Referring Provider (PT): Hulan Saas, DO   Encounter Date: 07/11/2019  PT End of Session - 07/11/19 [id number]    Visit Number  2    Number of Visits  16    Date for PT Re-Evaluation  08/19/19    Authorization Type  BCBS    PT Start Time  [id number]    PT Stop Time  [id number]    PT Time Calculation (min)  44 min    Activity Tolerance  Patient limited by pain    Behavior During Therapy  [facility] for tasks assessed/performed       Past Medical History:  Diagnosis Date  . Anxiety   . Arthritis    knees - no med  . Cluster headache    Hx - resolved per patient  . Depression   . DVT of lower extremity (deep venous thrombosis) ([place]) 6/26-28/2013   Xarelto - resolved - history  . HTN (hypertension)    currently no meds  . Irritability and anger    PMH of  . Kidney stone    passed stone, no surgery required.  . Rotator cuff arthropathy 2007   right  . Saddle pulmonary embolus ([place]) 6/26-28/2013   post orthopedic surgery - resolved  . Sleep apnea 5/15   moderate OSA-has cpap but does not use it    Past Surgical History:  Procedure Laterality Date  . arthroscopic knee surgery  10/09/2011   Dr Ninfa Linden  . CARDIAC CATHETERIZATION  2007   LVdysfunction; normal coronaries  . KNEE ARTHROSCOPY WITH MEDIAL MENISECTOMY Left 09/20/2014   medial menisectomy chondroplastytella medial plica excision  ;  Surgeon: Dorna Leitz, MD;  Location: [facility];  Service: Orthopedics;  Laterality: Left;  . ROTATOR CUFF REPAIR  2007   right  . UPPER GASTROINTESTINAL ENDOSCOPY  2007   normal  . WISDOM TOOTH EXTRACTION      There were no vitals filed for this visit.  Subjective Assessment - 07/11/19 1633    Subjective  "The shoulder is about  a 7/10 today and it hasn't changed since the last session"    Currently in Pain?  Yes    Pain Score  7     Pain Orientation  Right    Pain Descriptors / Indicators  Aching    Multiple Pain Sites  Yes         [facility] PT Assessment - 07/11/19 0001      Assessment   Medical Diagnosis  RT shoulder     Referring Provider (PT)  Hulan Saas, DO                   [facility] Adult PT Treatment/Exercise - 07/11/19 0001      Exercises   Exercises  Shoulder      Shoulder Exercises: Supine   Protraction  AAROM;Both;10 reps   halted due to limited ROM and pain     Shoulder Exercises: Seated   Flexion  10 reps   table slides cues to work into the ROM   Other Seated Exercises  self clavicle mobs grade II with towel   given as HEP.      Modalities   Modalities  Iontophoresis      Iontophoresis   Type of Iontophoresis  Dexamethasone  Location  R A/C joint    Dose  4mg /ml    Time  6 hours      Manual Therapy   Manual Therapy  Joint mobilization    Manual therapy comments  MTPR along the R upper trap/ levator scapuale and sub-clavics    Joint Mobilization  middle clavicle PA/AP mobs grade I-II for pain   middle clavicle due to increased pain with ligth touch @ A/C            PT Education - 07/11/19 1631    Education Details  muscle anatomy and referral patterns. What TPDN is, benefits/ what to expect and after care. updated HEP    Person(s) Educated  Patient    Methods  Explanation;Verbal cues;Handout    Comprehension  Verbalized understanding;Verbal cues required       PT Short Term Goals - 06/28/19 1628      PT SHORT TERM GOAL #1   Title  He will be independent with initial HEP    Time  3    Period  Weeks    Status  New      PT SHORT TERM GOAL #2   Title  He will report pain decreased 25% or mroe with general home activity    Time  3    Period  Weeks    Status  New      PT SHORT TERM GOAL #3   Title  Hew will report not having to modify dressing  due to decr pain.    Time  3    Period  Weeks    Status  New        PT Long Term Goals - 06/28/19 1629      PT LONG TERM GOAL #1   Title  He will be independent with all hEP issued    Time  8    Period  Weeks    Status  New      PT LONG TERM GOAL #2   Title  He will have intermittant pain and report able to lift more at work with less pain    Time  8    Period  Weeks    Status  New      PT LONG TERM GOAL #3   Title  He will report no problems at home with lifting and moving items    Time  8    Period  Weeks    Status  New      PT LONG TERM GOAL #4   Title  He will improve ROM active to equal LT  with min to no pain to ease use of  RT arm for home and work tasks    Time  [place] - 07/11/19 1719    Clinical Impression Statement  pt reports no changes in pain since the last session. Discussed TPDN treatment but pt opted to hold off until the next session, utilized MTPR techiques to calm down soreness in the upper trap/ levator scapulae. attempted supine AAROM exercises but pt exhibited difficulty secondary to increased pain, halted exercise and moved to PROM which he demonstrated difficulty with. educated and applied iontophoresis patch to the A/C joint.    PT Treatment/Interventions  Electrical Stimulation;Iontophoresis 4mg /ml Dexamethasone;Moist Heat;Therapeutic exercise;Patient/family education;Manual techniques;Dry needling;Passive range of motion;Taping    PT Next Visit Plan  Manual  and modalities   Start HEP , isometrics, scapula ROM. tape    PT Home Exercise Plan  [id number] - table slides flexion/ abduction, clavicle / rib mobs,    Consulted and Agree with Plan of Care  Patient       Patient will benefit from skilled therapeutic intervention in order to improve the following deficits and impairments:  Pain, Decreased strength, Decreased activity tolerance, Increased muscle spasms, Impaired UE functional use, Decreased  range of motion  Visit Diagnosis: Chronic right shoulder pain  Bursitis of right shoulder  Cramp and spasm  Stiffness of right shoulder, not elsewhere classified     Problem List Patient Active Problem List   Diagnosis Date Noted  . Excessive somnolence disorder 05/09/2019  . AC (acromioclavicular) arthritis 05/03/2019  . Subacromial bursitis of right shoulder joint 05/03/2019  . Diuretic-induced hypokalemia 02/03/2019  . Ejection fraction < 50% 02/03/2019  . Vitamin D deficiency disease 11/03/2018  . PUD (peptic ulcer disease) 01/20/2018  . Renal stone 08/26/2017  . Morbid obesity ([place]) 08/25/2017  . GERD with esophagitis 08/24/2017  . Degenerative arthritis of knee, bilateral 06/08/2017  . Venous insufficiency of both lower extremities 10/05/2015  . Routine general medical examination at a health care facility 08/21/2015  . Heterozygous factor V Leiden mutation ([place]) 08/24/2013  . OSA (obstructive sleep apnea) 08/13/2013  . Hyperglycemia 11/10/2008  . Mixed hyperlipidemia 07/06/2008  . Essential hypertension 07/06/2008  . ERECTILE DYSFUNCTION, ORGANIC 01/28/2007   Starr Lake PT, DPT, LAT, ATC  07/11/19  5:28 PM      [facility] [address] [place], [place], [address] Phone: [phone]   Fax:  [phone]  Name: [name] MRN: [id number] Date of Birth: 1965-07-30

## 2019-07-12 ENCOUNTER — Other Ambulatory Visit: Payer: Self-pay | Admitting: Internal Medicine

## 2019-07-12 DIAGNOSIS — N529 Male erectile dysfunction, unspecified: Secondary | ICD-10-CM

## 2019-07-12 MED ORDER — SILDENAFIL CITRATE 20 MG PO TABS: 60.0000 mg | ORAL_TABLET | Freq: Every day | ORAL | 5 refills | Status: DC | PRN

## 2019-07-12 NOTE — Telephone Encounter (Signed)
Rq to refill sildenafil to [facility]. Please advise.

## 2019-07-15 ENCOUNTER — Ambulatory Visit: Payer: BC Managed Care – PPO | Attending: Internal Medicine

## 2019-07-15 DIAGNOSIS — Z20822 Contact with and (suspected) exposure to covid-19: Secondary | ICD-10-CM | POA: Diagnosis not present

## 2019-07-16 LAB — NOVEL CORONAVIRUS, NAA: SARS-CoV-2, NAA: NOT DETECTED

## 2019-07-18 ENCOUNTER — Ambulatory Visit: Payer: BC Managed Care – PPO

## 2019-07-20 ENCOUNTER — Ambulatory Visit: Payer: BC Managed Care – PPO | Admitting: Physical Therapy

## 2019-07-20 ENCOUNTER — Other Ambulatory Visit: Payer: Self-pay

## 2019-07-20 ENCOUNTER — Encounter: Payer: Self-pay | Admitting: Physical Therapy

## 2019-07-20 DIAGNOSIS — M25611 Stiffness of right shoulder, not elsewhere classified: Secondary | ICD-10-CM | POA: Diagnosis not present

## 2019-07-20 DIAGNOSIS — M7551 Bursitis of right shoulder: Secondary | ICD-10-CM

## 2019-07-20 DIAGNOSIS — G8929 Other chronic pain: Secondary | ICD-10-CM | POA: Diagnosis not present

## 2019-07-20 DIAGNOSIS — R252 Cramp and spasm: Secondary | ICD-10-CM

## 2019-07-20 DIAGNOSIS — M25511 Pain in right shoulder: Secondary | ICD-10-CM | POA: Diagnosis not present

## 2019-07-20 NOTE — Therapy (Signed)
[place], [place], [address] Phone: [phone]   Fax:  [phone]  Physical Therapy Treatment  Patient Details  Name: [name] MRN: [id number] Date of Birth: [birth year] Referring Provider (PT): Hulan Saas, DO   Encounter Date: 07/20/2019  PT End of Session - 07/20/19 1633    Visit Number  3    Number of Visits  16    Date for PT Re-Evaluation  08/19/19    Authorization Type  BCBS    PT Start Time  1633    PT Stop Time  1712    PT Time Calculation (min)  39 min    Activity Tolerance  Patient limited by pain    Behavior During Therapy  [facility] for tasks assessed/performed       Past Medical History:  Diagnosis Date  . Anxiety   . Arthritis    knees - no med  . Cluster headache    Hx - resolved per patient  . Depression   . DVT of lower extremity (deep venous thrombosis) ([place]) 6/26-28/2013   Xarelto - resolved - history  . HTN (hypertension)    currently no meds  . Irritability and anger    PMH of  . Kidney stone    passed stone, no surgery required.  . Rotator cuff arthropathy 2007   right  . Saddle pulmonary embolus ([place]) 6/26-28/2013   post orthopedic surgery - resolved  . Sleep apnea 5/15   moderate OSA-has cpap but does not use it    Past Surgical History:  Procedure Laterality Date  . arthroscopic knee surgery  10/09/2011   Dr Ninfa Linden  . CARDIAC CATHETERIZATION  2007   LVdysfunction; normal coronaries  . KNEE ARTHROSCOPY WITH MEDIAL MENISECTOMY Left 09/20/2014   medial menisectomy chondroplastytella medial plica excision  ;  Surgeon: Dorna Leitz, MD;  Location: [facility];  Service: Orthopedics;  Laterality: Left;  . ROTATOR CUFF REPAIR  2007   right  . UPPER GASTROINTESTINAL ENDOSCOPY  2007   normal  . WISDOM TOOTH EXTRACTION      There were no vitals filed for this visit.  Subjective Assessment - 07/20/19 1633    Subjective  "I am not really  getting any relief. the ionto patch helped a little but it didn't last. the pain got so bad that the whole arm got numb"    Currently in Pain?  Yes    Pain Score  7     Pain Location  Shoulder    Pain Orientation  Right    Pain Descriptors / Indicators  Aching;Sore    Pain Type  Chronic pain    Pain Onset  More than a month ago    Pain Frequency  Constant    Aggravating Factors   any activity    Pain Relieving Factors  icy hot, KT tape                       [facility] Adult PT Treatment/Exercise - 07/20/19 0001      Shoulder Exercises: Pulleys   Flexion  2 minutes    Scaption  2 minutes      Shoulder Exercises: ROM/Strengthening   Other ROM/Strengthening Exercises  UE ranger protraction, IR/ER and rows 2 x 10 ea.    combined with E-stim to help modulate pain     Electrical Stimulation   Electrical Stimulation Location  RT shoulder    Electrical Stimulation Action  IFC    Electrical Stimulation Parameters  L 22 x 10 min, carrier freq 5000, 100% scan    Electrical Stimulation Goals  Pain      Iontophoresis   Type of Iontophoresis  Dexamethasone    Location  R A/C joint    Dose  4mg /ml    Time  6 hours      Manual Therapy   Manual Therapy  Joint mobilization    Manual therapy comments  MTPR along the R upper trap/ levator scapuale and sub-clavics    Joint Mobilization  middle clavicle PA/AP mobs grade I-II for pain   continued just promixmal to A/C joint due to hypersensity            PT Education - 07/20/19 1719    Education Details  updated HEP for TENS handout and discussed benefits.    Person(s) Educated  Patient    Methods  Explanation;Verbal cues    Comprehension  Verbalized understanding;Verbal cues required       PT Short Term Goals - 06/28/19 1628      PT SHORT TERM GOAL #1   Title  He will be independent with initial HEP    Time  3    Period  Weeks    Status  New      PT SHORT TERM GOAL #2   Title  He will report pain decreased 25%  or mroe with general home activity    Time  3    Period  Weeks    Status  New      PT SHORT TERM GOAL #3   Title  Hew will report not having to modify dressing due to decr pain.    Time  3    Period  Weeks    Status  New        PT Long Term Goals - 06/28/19 1629      PT LONG TERM GOAL #1   Title  He will be independent with all hEP issued    Time  8    Period  Weeks    Status  New      PT LONG TERM GOAL #2   Title  He will have intermittant pain and report able to lift more at work with less pain    Time  8    Period  Weeks    Status  New      PT LONG TERM GOAL #3   Title  He will report no problems at home with lifting and moving items    Time  8    Period  Weeks    Status  New      PT LONG TERM GOAL #4   Title  He will improve ROM active to equal LT  with min to no pain to ease use of  RT arm for home and work tasks    Time  [place] - 07/20/19 1715    Clinical Impression Statement  pt continues have significant pain inthe R shoulder noting minimal relief of pain with the iontopatch. continue dworking on AAROM using pulleys and the UE ranger in combination with E-stim to modulate pain to maximize ROM benefit. continued application of iontophoresis on the A/C joint. discussed with pt if he continues to having significant pain and limited funcitonal progress in the next few visits  then paln to refer back to his MD for further assessment.    PT Treatment/Interventions  Electrical Stimulation;Iontophoresis 4mg /ml Dexamethasone;Moist Heat;Therapeutic exercise;Patient/family education;Manual techniques;Dry needling;Passive range of motion;Taping    PT Next Visit Plan  Manual and modalities   Start HEP , isometrics, scapula ROM. tape, how was iontophorsis    PT Home Exercise Plan  [phone] - table slides flexion/ abduction, clavicle / rib mobs,       Patient will benefit from skilled therapeutic intervention in order to  improve the following deficits and impairments:  Pain, Decreased strength, Decreased activity tolerance, Increased muscle spasms, Impaired UE functional use, Decreased range of motion  Visit Diagnosis: Chronic right shoulder pain  Bursitis of right shoulder  Cramp and spasm  Stiffness of right shoulder, not elsewhere classified     Problem List Patient Active Problem List   Diagnosis Date Noted  . Excessive somnolence disorder 05/09/2019  . AC (acromioclavicular) arthritis 05/03/2019  . Subacromial bursitis of right shoulder joint 05/03/2019  . Diuretic-induced hypokalemia 02/03/2019  . Ejection fraction < 50% 02/03/2019  . Vitamin D deficiency disease 11/03/2018  . PUD (peptic ulcer disease) 01/20/2018  . Renal stone 08/26/2017  . Morbid obesity ([place]) 08/25/2017  . GERD with esophagitis 08/24/2017  . Degenerative arthritis of knee, bilateral 06/08/2017  . Venous insufficiency of both lower extremities 10/05/2015  . Routine general medical examination at a health care facility 08/21/2015  . Heterozygous factor V Leiden mutation ([place]) 08/24/2013  . OSA (obstructive sleep apnea) 08/13/2013  . Hyperglycemia 11/10/2008  . Mixed hyperlipidemia 07/06/2008  . Essential hypertension 07/06/2008  . ERECTILE DYSFUNCTION, ORGANIC 01/28/2007   Starr Lake PT, DPT, LAT, ATC  07/20/19  5:23 PM      [facility] [address] [place], [place], [address] Phone: [phone]   Fax:  [phone]  Name: [name] MRN: [id number] Date of Birth: 07/20/1965

## 2019-07-20 NOTE — Patient Instructions (Signed)

## 2019-07-25 ENCOUNTER — Ambulatory Visit: Payer: BC Managed Care – PPO

## 2019-07-25 ENCOUNTER — Telehealth: Payer: Self-pay | Admitting: Pulmonary Disease

## 2019-07-25 DIAGNOSIS — G4733 Obstructive sleep apnea (adult) (pediatric): Secondary | ICD-10-CM

## 2019-07-25 NOTE — Telephone Encounter (Signed)
Spoke with patient. He stated he is having a hard time with getting supplies from [facility] since switching from [facility] late last year. [facility] has been sending him the incorrect supplies, including filters for his cpap that are not packaged. He doesn't trust the filters since they are not packaged and could be used.   He was with [facility] for his initial CPAP setup. He wishes to switch back to [facility]. I advised him that we would need to get RA's permission since an order had to be placed. He verbalized understanding.   RA, please advise if you are ok with [place] placing an order for him to switch back to [facility] for his CPAP. Thanks!

## 2019-07-26 NOTE — Telephone Encounter (Signed)
Okay to place order to switch DME

## 2019-07-26 NOTE — Telephone Encounter (Signed)
DME switch order was placed  LMTCB so we can inform the pt

## 2019-07-27 ENCOUNTER — Ambulatory Visit: Payer: BC Managed Care – PPO | Admitting: Physical Therapy

## 2019-07-27 ENCOUNTER — Encounter: Payer: Self-pay | Admitting: Physical Therapy

## 2019-07-27 ENCOUNTER — Other Ambulatory Visit: Payer: Self-pay

## 2019-07-27 ENCOUNTER — Telehealth: Payer: Self-pay | Admitting: Pulmonary Disease

## 2019-07-27 DIAGNOSIS — M7551 Bursitis of right shoulder: Secondary | ICD-10-CM | POA: Diagnosis not present

## 2019-07-27 DIAGNOSIS — G8929 Other chronic pain: Secondary | ICD-10-CM

## 2019-07-27 DIAGNOSIS — R252 Cramp and spasm: Secondary | ICD-10-CM | POA: Diagnosis not present

## 2019-07-27 DIAGNOSIS — M25611 Stiffness of right shoulder, not elsewhere classified: Secondary | ICD-10-CM | POA: Diagnosis not present

## 2019-07-27 DIAGNOSIS — M25511 Pain in right shoulder: Secondary | ICD-10-CM | POA: Diagnosis not present

## 2019-07-27 NOTE — Telephone Encounter (Signed)
Duplicate message - see 2.22.21 phone note

## 2019-07-27 NOTE — Telephone Encounter (Signed)
Patient returned call Patient is aware order to change DME has been placed as requested Nothing further needed; will sign off

## 2019-07-27 NOTE — Telephone Encounter (Signed)
LMTCB x2 for pt 

## 2019-07-27 NOTE — Therapy (Addendum)
[place], [place], [address] Phone: [phone]   Fax:  [phone]  Physical Therapy Treatment / Discharge  Patient Details  Name: [name] MRN: [id number] Date of Birth: 1966/02/23 Referring Provider (PT): Hulan Saas, DO   Encounter Date: 07/27/2019  PT End of Session - 07/27/19 1608    Visit Number  4    Number of Visits  16    Date for PT Re-Evaluation  08/19/19    PT Start Time  1610    PT Stop Time  1650    PT Time Calculation (min)  40 min    Activity Tolerance  Patient limited by pain    Behavior During Therapy  [facility] for tasks assessed/performed       Past Medical History:  Diagnosis Date  . Anxiety   . Arthritis    knees - no med  . Cluster headache    Hx - resolved per patient  . Depression   . DVT of lower extremity (deep venous thrombosis) ([place]) 6/26-28/2013   Xarelto - resolved - history  . HTN (hypertension)    currently no meds  . Irritability and anger    PMH of  . Kidney stone    passed stone, no surgery required.  . Rotator cuff arthropathy 2007   right  . Saddle pulmonary embolus ([place]) 6/26-28/2013   post orthopedic surgery - resolved  . Sleep apnea 5/15   moderate OSA-has cpap but does not use it    Past Surgical History:  Procedure Laterality Date  . arthroscopic knee surgery  10/09/2011   Dr Ninfa Linden  . CARDIAC CATHETERIZATION  2007   LVdysfunction; normal coronaries  . KNEE ARTHROSCOPY WITH MEDIAL MENISECTOMY Left 09/20/2014   medial menisectomy chondroplastytella medial plica excision  ;  Surgeon: Dorna Leitz, MD;  Location: [facility];  Service: Orthopedics;  Laterality: Left;  . ROTATOR CUFF REPAIR  2007   right  . UPPER GASTROINTESTINAL ENDOSCOPY  2007   normal  . WISDOM TOOTH EXTRACTION      There were no vitals filed for this visit.  Subjective Assessment - 07/27/19 1609    Subjective  "I am still having alot of pain in the  shoulder and have no real  changes"    Currently in Pain?  Yes    Pain Score  7     Pain Location  Shoulder    Pain Orientation  Right    Pain Descriptors / Indicators  Aching;Sore    Pain Type  Chronic pain    Pain Onset  More than a month ago    Aggravating Factors   any activity         [facility] PT Assessment - 07/27/19 0001      Assessment   Medical Diagnosis  RT shoulder     Referring Provider (PT)  Hulan Saas, DO                   [facility] Adult PT Treatment/Exercise - 07/27/19 0001      Shoulder Exercises: Pulleys   Flexion  2 minutes    Scaption  2 minutes      Shoulder Exercises: Isometric Strengthening   Flexion  --   1 x 10 holding 5 seconds   Extension  --   1 x 10 holding 5 seconds   External Rotation  --   1 x 10 holding 5 seconds   Internal Rotation  --  1 x 10 holding 5 seconds     Modalities   Modalities  Ultrasound      Ultrasound   Ultrasound Location  R A/C joint    Ultrasound Parameters  1.0 mhz @ 1.0 w/cm2 x 8 min     Ultrasound Goals  Pain      Manual Therapy   Manual Therapy  Other (comment)    Other Manual Therapy  distal clavilce taping to promote pressure dispersment               PT Short Term Goals - 06/28/19 1628      PT SHORT TERM GOAL #1   Title  He will be independent with initial HEP    Time  3    Period  Weeks    Status  New      PT SHORT TERM GOAL #2   Title  He will report pain decreased 25% or mroe with general home activity    Time  3    Period  Weeks    Status  New      PT SHORT TERM GOAL #3   Title  Hew will report not having to modify dressing due to decr pain.    Time  3    Period  Weeks    Status  New        PT Long Term Goals - 06/28/19 1629      PT LONG TERM GOAL #1   Title  He will be independent with all hEP issued    Time  8    Period  Weeks    Status  New      PT LONG TERM GOAL #2   Title  He will have intermittant pain and report able to lift more at work with less  pain    Time  8    Period  Weeks    Status  New      PT LONG TERM GOAL #3   Title  He will report no problems at home with lifting and moving items    Time  8    Period  Weeks    Status  New      PT LONG TERM GOAL #4   Title  He will improve ROM active to equal LT  with min to no pain to ease use of  RT arm for home and work tasks    Time  [place] - 07/27/19 1653    Clinical Impression Statement  pt conitnues to report significant pain in the a/C joint rated at 7/10. Utilized [place] to promote cavitation and facilitate healing, and trialed distal A/C joint tapoing to promote increased contact area to help with dispersment of pressure which he noted helped some. opted to hold off on ionto to assess response to taping.    PT Treatment/Interventions  Electrical Stimulation;Iontophoresis 86m/ml Dexamethasone;Moist Heat;Therapeutic exercise;Patient/family education;Manual techniques;Dry needling;Passive range of motion;Taping    PT Next Visit Plan  Manual and modalities   Start HEP , isometrics, scapula ROM. tape, how was iontophorsis    PT Home Exercise Plan  [id number]- table slides flexion/ abduction, clavicle / rib mobs,    Consulted and Agree with Plan of Care  Patient       Patient will benefit from skilled therapeutic intervention in order to improve the following deficits  and impairments:  Pain, Decreased strength, Decreased activity tolerance, Increased muscle spasms, Impaired UE functional use, Decreased range of motion  Visit Diagnosis: Chronic right shoulder pain  Bursitis of right shoulder  Cramp and spasm  Stiffness of right shoulder, not elsewhere classified     Problem List Patient Active Problem List   Diagnosis Date Noted  . Excessive somnolence disorder 05/09/2019  . AC (acromioclavicular) arthritis 05/03/2019  . Subacromial bursitis of right shoulder joint 05/03/2019  . Diuretic-induced hypokalemia 02/03/2019  .  Ejection fraction < 50% 02/03/2019  . Vitamin D deficiency disease 11/03/2018  . PUD (peptic ulcer disease) 01/20/2018  . Renal stone 08/26/2017  . Morbid obesity ([place]) 08/25/2017  . GERD with esophagitis 08/24/2017  . Degenerative arthritis of knee, bilateral 06/08/2017  . Venous insufficiency of both lower extremities 10/05/2015  . Routine general medical examination at a health care facility 08/21/2015  . Heterozygous factor V Leiden mutation ([place]) 08/24/2013  . OSA (obstructive sleep apnea) 08/13/2013  . Hyperglycemia 11/10/2008  . Mixed hyperlipidemia 07/06/2008  . Essential hypertension 07/06/2008  . ERECTILE DYSFUNCTION, ORGANIC 01/28/2007    Starr Lake PT, DPT, LAT, ATC  07/27/19  4:57 PM      [place] [facility] [address] [place], [place], [address] Phone: [phone]   Fax:  [phone]  Name: [name] MRN: [id number] Date of Birth: 07-16-1965       PHYSICAL THERAPY DISCHARGE SUMMARY  Visits from Start of Care: 4  Current functional level related to goals / functional outcomes: See goals   Remaining deficits: unknown   Education / Equipment: HEP  Plan: Patient agrees to discharge.  Patient goals were not met. Patient is being discharged due to not returning since the last visit.  ?????         Cobie Marcoux PT, DPT, LAT, ATC  09/13/19  4:18 PM

## 2019-07-29 ENCOUNTER — Other Ambulatory Visit: Payer: Self-pay

## 2019-07-29 ENCOUNTER — Ambulatory Visit (INDEPENDENT_AMBULATORY_CARE_PROVIDER_SITE_OTHER): Payer: BC Managed Care – PPO

## 2019-07-29 ENCOUNTER — Ambulatory Visit (INDEPENDENT_AMBULATORY_CARE_PROVIDER_SITE_OTHER): Payer: BC Managed Care – PPO | Admitting: Family Medicine

## 2019-07-29 ENCOUNTER — Telehealth: Payer: Self-pay | Admitting: Family Medicine

## 2019-07-29 VITALS — BP 110/72 | HR 90 | Ht 66.0 in | Wt 272.0 lb

## 2019-07-29 DIAGNOSIS — M25511 Pain in right shoulder: Secondary | ICD-10-CM

## 2019-07-29 DIAGNOSIS — M170 Bilateral primary osteoarthritis of knee: Secondary | ICD-10-CM | POA: Diagnosis not present

## 2019-07-29 DIAGNOSIS — G8929 Other chronic pain: Secondary | ICD-10-CM

## 2019-07-29 DIAGNOSIS — M19011 Primary osteoarthritis, right shoulder: Secondary | ICD-10-CM

## 2019-07-29 MED ORDER — PREDNISONE 50 MG PO TABS: ORAL_TABLET | ORAL | 0 refills | Status: DC

## 2019-07-29 MED ORDER — PREDNISONE 50 MG PO TABS: 50.0000 mg | ORAL_TABLET | Freq: Every day | ORAL | 0 refills | Status: DC

## 2019-07-29 NOTE — Patient Instructions (Signed)
[facility] [phone] Will call you after MRI Prednisone

## 2019-07-29 NOTE — Progress Notes (Signed)
[facility] [place] Phone: [phone] Subjective:   [name] [name], am serving as a scribe for Dr. Hulan Saas. This visit occurred during the SARS-CoV-2 public health emergency.  Safety protocols were in place, including screening questions prior to the visit, additional usage of staff PPE, and extensive cleaning of exam room while observing appropriate contact time as indicated for disinfecting solutions.   I'm seeing this patient by the request  of:  [name] Lima, MD  CC: Bilateral knee pain, shoulder pain  [id number]   [id number] Stable.  Patient wants to avoid any type of the injections if possible.  Has had cramping intermittently with this previously.  Patient will follow up with me again in 4 to 8 weeks  Continues to have difficulty and does have arthritis of the acromioclavicular joint as well as subacromial bursitis.  Sent to formal physical therapy.  Patient wants to hold on any type of injection.  Patient would be more willing to actually have surgery and if we fail physical therapy will consider the advanced imaging.  Increase activity as tolerated.  Follow-up again in 4 to 8 weeks  Update 07/29/2019 [name] [name] is a 54 y.o. male coming in with complaint of right shoulder and bilateral knee pain. Patient states that his pain is constant. Pain is worse with movement.  Patient has been found more to have arthritic changes of the acromioclavicular joint that is quite severe as well as the subacromial bursitis but [name] true rotator cuff tear appreciated on ultrasound.  Also here for bilateral knee injections.  Known arthritic changes with degenerative meniscal tears.  Patient has responded fairly well to intermittent injections and would like to continue with this type of therapy.  Wants to avoid any surgical intervention.     Past Medical History:  Diagnosis Date  . Anxiety   . Arthritis    knees - [name]  med  . Cluster headache    Hx - resolved per patient  . Depression   . DVT of lower extremity (deep venous thrombosis) ([facility]) 6/26-28/2013   Xarelto - resolved - history  . HTN (hypertension)    currently [name] meds  . Irritability and anger    PMH of  . Kidney stone    passed stone, [name] surgery required.  . Rotator cuff arthropathy 2007   right  . Saddle pulmonary embolus ([facility]) 6/26-28/2013   post orthopedic surgery - resolved  . Sleep apnea 5/15   moderate OSA-has cpap but does not use it   Past Surgical History:  Procedure Laterality Date  . arthroscopic knee surgery  10/09/2011   Dr [name] [name]  . CARDIAC CATHETERIZATION  2007   LVdysfunction; normal coronaries  . KNEE ARTHROSCOPY WITH MEDIAL MENISECTOMY Left 09/20/2014   medial menisectomy chondroplastytella medial plica excision  ;  Surgeon: [name] Leitz, MD;  Location: [facility];  Service: Orthopedics;  Laterality: Left;  . ROTATOR CUFF REPAIR  2007   right  . UPPER GASTROINTESTINAL ENDOSCOPY  2007   normal  . WISDOM TOOTH EXTRACTION     Social History   Socioeconomic History  . Marital status: Married    Spouse name: Not on file  . Number of children: 6  . Years of education: 42  . Highest education level: Not on file  Occupational History  . Occupation: Glass blower/designer   . Occupation: Programmer, systems  Tobacco Use  . Smoking status: Former Smoker  Types: Cigars  . Smokeless tobacco: Never Used  . Tobacco comment: every 3-4 months  Substance and Sexual Activity  . Alcohol use: Yes    Alcohol/week: 0.0 standard drinks    Comment: drinks occasionally  . Drug use: [name]  . Sexual activity: Not on file  Other Topics Concern  . Not on file  Social History Narrative   Fun: Exercise   Social Determinants of Health   Financial Resource Strain:   . Difficulty of Paying Living Expenses: Not on file  Food Insecurity:   . Worried About Charity fundraiser in the Last Year: Not on file  . Ran Out of  Food in the Last Year: Not on file  Transportation Needs:   . Lack of Transportation (Medical): Not on file  . Lack of Transportation (Non-Medical): Not on file  Physical Activity:   . Days of Exercise per Week: Not on file  . Minutes of Exercise per Session: Not on file  Stress:   . Feeling of Stress : Not on file  Social Connections:   . Frequency of Communication with Friends and Family: Not on file  . Frequency of Social Gatherings with Friends and Family: Not on file  . Attends Religious Services: Not on file  . Active Member of Clubs or Organizations: Not on file  . Attends Archivist Meetings: Not on file  . Marital Status: Not on file   Allergies  Allergen Reactions  . Tramadol Other (See Comments)    insomnia   Family History  Problem Relation Age of Onset  . Cancer Mother        Mesothelioma   . Diabetes Brother   . Hypertension Brother   . Stroke Brother 32  . Heart attack Brother 18  . Diabetes Maternal Aunt   . Diabetes Maternal Grandmother   . Heart disease Maternal Grandmother   . Hypertension Maternal Grandmother   . Stroke Maternal Grandmother        in 58s  . Deep vein thrombosis Maternal Grandmother   . Stroke Maternal Grandfather        > 55  . Migraines Brother   . Heart attack Brother 35  . Colon cancer Neg Hx   . Esophageal cancer Neg Hx   . Rectal cancer Neg Hx   . Stomach cancer Neg Hx   . Liver cancer Neg Hx   . Pancreatic cancer Neg Hx     Current Outpatient Medications (Endocrine & Metabolic):  .  predniSONE (DELTASONE) 50 MG tablet, Take 1 tablet (50 mg total) by mouth daily. .  predniSONE (DELTASONE) 50 MG tablet, Take one tablet daily for the next 5 days.  Current Outpatient Medications (Cardiovascular):  .  indapamide (LOZOL) 1.25 MG tablet, Take 1 tablet (1.25 mg total) by mouth daily. .  sildenafil (REVATIO) 20 MG tablet, Take 3 tablets (60 mg total) by mouth daily as needed.  Current Outpatient Medications  (Respiratory):  .  albuterol (PROVENTIL HFA;VENTOLIN HFA) 108 (90 Base) MCG/ACT inhaler, Inhale 2 puffs into the lungs every 6 (six) hours as needed for wheezing or shortness of breath. .  fluticasone (FLONASE) 50 MCG/ACT nasal spray, Place 1 spray into both nostrils daily.   Current Outpatient Medications (Hematological):  .  rivaroxaban (XARELTO) 10 MG TABS tablet, Take 1 tablet (10 mg total) by mouth daily.  Current Outpatient Medications (Other):  [name]  Armodafinil 150 MG tablet, Take 1 tablet (150 mg total) by mouth daily. .  Cholecalciferol  50 MCG (2000 UT) TABS, Take 2 tablets (4,000 Units total) by mouth daily. .  Multiple Vitamin (MULTIVITAMIN WITH MINERALS) TABS tablet, Take 1 tablet by mouth daily. .  pantoprazole (PROTONIX) 40 MG tablet, Take 1 tablet (40 mg total) by mouth daily. .  potassium chloride SA (KLOR-CON) 20 MEQ tablet, Take 1 tablet (20 mEq total) by mouth 2 (two) times daily. .  Vitamin D, Ergocalciferol, (DRISDOL) 1.25 MG (50000 UNIT) CAPS capsule, Take 1 capsule (50,000 Units total) by mouth every 7 (seven) days.   Reviewed prior external information including notes and imaging from  primary care provider As well as notes that were available from care everywhere and other healthcare systems.  Past medical history, social, surgical and family history all reviewed in electronic medical record.  [name] pertanent information unless stated regarding to the chief complaint.   Review of Systems:  [name] headache, visual changes, nausea, vomiting, diarrhea, constipation, dizziness, abdominal pain, skin rash, fevers, chills, night sweats, weight loss, swollen lymph nodes, body aches, joint swelling, chest pain, shortness of breath, mood changes. POSITIVE muscle aches  Objective  Blood pressure 110/72, pulse 90, height 5\' 6"  (1.676 m), weight 272 lb (123.4 kg), SpO2 97 %.   General: [name] apparent distress alert and oriented x3 mood and affect normal, dressed appropriately.  HEENT:  Pupils equal, extraocular movements intact  Respiratory: Patient's speak in full sentences and does not appear short of breath  Cardiovascular: [name] lower extremity edema, non tender, [name] erythema  Skin: Warm dry intact with [name] signs of infection or rash on extremities or on axial skeleton.  Abdomen: Soft nontender  Neuro: Cranial nerves II through XII are intact, neurovascularly intact in all extremities with 2+ DTRs and 2+ pulses.  Lymph: [name] lymphadenopathy of posterior or anterior cervical chain or axillae bilaterally.  Gait normal with good balance and coordination.  MSK:   Right shoulder exam still has positive impingement noted.  Patient also has what appears to be positive crossover with severe tenderness over the acromioclavicular joint.  Rotator cuff strength appears nearly symmetric to the contralateral side.  Near full range of motion may be lacking the last 2 degrees of external rotation Knee: Bilateral valgus deformity noted.  Abnormal thigh to calf ratio.  Tender to palpation over medial and PF joint line.  ROM full in flexion and extension and lower leg rotation. instability with valgus force.  painful patellar compression. Patellar glide with moderate crepitus. Patellar and quadriceps tendons unremarkable. Hamstring and quadriceps strength is normal.  After informed written and verbal consent, patient was seated on exam table. Right knee was prepped with alcohol swab and utilizing anterolateral approach, patient's right knee space was injected with 4:1  marcaine 0.5%: Kenalog 40mg /dL. Patient tolerated the procedure well without immediate complications.  After informed written and verbal consent, patient was seated on exam table. Left knee was prepped with alcohol swab and utilizing anterolateral approach, patient's left knee space was injected with 4:1  marcaine 0.5%: Kenalog 40mg /dL. Patient tolerated the procedure well without immediate complications.   Impression and  Recommendations:     This case required medical decision making of moderate complexity. The above documentation has been reviewed and is accurate and complete Lyndal Pulley, DO       Note: This dictation was prepared with Dragon dictation along with smaller phrase technology. Any transcriptional errors that result from this process are unintentional.

## 2019-07-29 NOTE — Assessment & Plan Note (Signed)
On ultrasound today shows a chronic problem does have an exacerbation with hypoechoic changes and a positive motion sign.  Patient declined any type of injection and prednisone given today.  Hold on the meloxicam.  Warned of potential side effects.  Follow-up again in 4 to 8 weeks

## 2019-07-29 NOTE — Assessment & Plan Note (Signed)
Bilateral steroid injections given today.  Has responded fairly well previously.  Has discussed the possibility of viscosupplementation with this individual.Chronic problem that is exacerbating.  Social determinants of health include financial constraints and patient having difficulty getting time off work.  Wants to avoid surgical intervention.  Follow-up with me again in 8 to 12 weeks

## 2019-07-29 NOTE — Telephone Encounter (Signed)
Patient on schedule today.  

## 2019-07-29 NOTE — Telephone Encounter (Signed)
Pt called, wanted to talk to Dr. Tamala Julian or his assistant about therapy. Michela Pitcher you would know what it is about and would not give any further info

## 2019-07-31 ENCOUNTER — Other Ambulatory Visit: Payer: Self-pay | Admitting: Internal Medicine

## 2019-07-31 ENCOUNTER — Encounter: Payer: Self-pay | Admitting: Family Medicine

## 2019-07-31 DIAGNOSIS — I10 Essential (primary) hypertension: Secondary | ICD-10-CM

## 2019-07-31 DIAGNOSIS — T502X5A Adverse effect of carbonic-anhydrase inhibitors, benzothiadiazides and other diuretics, initial encounter: Secondary | ICD-10-CM

## 2019-07-31 DIAGNOSIS — E876 Hypokalemia: Secondary | ICD-10-CM

## 2019-08-01 ENCOUNTER — Ambulatory Visit: Payer: BC Managed Care – PPO

## 2019-08-03 ENCOUNTER — Ambulatory Visit: Payer: BC Managed Care – PPO | Admitting: Physical Therapy

## 2019-08-04 ENCOUNTER — Ambulatory Visit: Payer: BC Managed Care – PPO | Attending: Internal Medicine

## 2019-08-10 ENCOUNTER — Other Ambulatory Visit: Payer: Self-pay | Admitting: Internal Medicine

## 2019-08-10 DIAGNOSIS — G4733 Obstructive sleep apnea (adult) (pediatric): Secondary | ICD-10-CM

## 2019-08-10 DIAGNOSIS — G4710 Hypersomnia, unspecified: Secondary | ICD-10-CM

## 2019-08-15 ENCOUNTER — Telehealth: Payer: Self-pay | Admitting: Pulmonary Disease

## 2019-08-15 NOTE — Telephone Encounter (Signed)
Called [facility] but they are closed for lunch. Will try again later.

## 2019-08-16 NOTE — Telephone Encounter (Signed)
Called [facility] and left message to call back, with cpap update for Patient.

## 2019-08-16 NOTE — Telephone Encounter (Signed)
Pt returning call.  [phone].  Pt works until 3:30 or can leave detailed message on cell phone voice mail.

## 2019-08-16 NOTE — Telephone Encounter (Signed)
[facility] returned call, spoke with [facility]  Threasa Beards stated Patient is not eligible for a new cpap,because last cpap was received in 2019. Per insurance, cpap must be 54 years old.  Threasa Beards stated Patient account is on hold by collections department.  Threasa Beards stated Patient needs to reach out to [facility] in [place]. Patient can not receive supplies until debt has been taken care of, or arranged. ATC Patient. LM for Patient to call back.

## 2019-08-17 NOTE — Telephone Encounter (Signed)
LMTCB x1 for pt.  

## 2019-08-17 NOTE — Telephone Encounter (Signed)
Pt called back-- please return call.  

## 2019-08-17 NOTE — Telephone Encounter (Signed)
Called and left message for Patient to contact [facility] at [phone], regarding account.

## 2019-08-18 ENCOUNTER — Telehealth: Payer: Self-pay | Admitting: Pulmonary Disease

## 2019-08-18 NOTE — Telephone Encounter (Signed)
Notified [facility].  I will fax to them as soon as I receive this. Nothing further needed at this time.

## 2019-08-18 NOTE — Telephone Encounter (Signed)
DME order placed for pt to switch from [facility] to [facility] for cpap needs.  [facility] is requesting a copy of the most recent compliance report from [facility] as noted below.  (ok to have it faxed to me at [phone])  I didn't see it in pt's chart...  Charmian Muff sent to [place], [place],   By chance, do you have a copy of this pt's most recent compliance report? I need the last 90 days of data in order to get authorization for this pt's supplies. [facility] will not release this information to me. Could you let me know ASAP please?   Thanks!  Janett Billow

## 2019-08-18 NOTE — Telephone Encounter (Signed)
Per pt's account in airview the last update was from 02/2019. Called [facility] to update his account and they will fax a 90 day DL to [employer] at fax [phone].   Will forward back to [place] as [place].

## 2019-08-19 ENCOUNTER — Ambulatory Visit: Payer: BC Managed Care – PPO | Admitting: Family Medicine

## 2019-08-20 ENCOUNTER — Other Ambulatory Visit: Payer: Self-pay

## 2019-08-20 ENCOUNTER — Ambulatory Visit
Admission: RE | Admit: 2019-08-20 | Discharge: 2019-08-20 | Disposition: A | Discharge status: Home / Self Care | Payer: BC Managed Care – PPO | Source: Ambulatory Visit | Attending: Family Medicine | Admitting: Family Medicine

## 2019-08-20 DIAGNOSIS — M25511 Pain in right shoulder: Secondary | ICD-10-CM | POA: Diagnosis not present

## 2019-08-20 DIAGNOSIS — M19011 Primary osteoarthritis, right shoulder: Secondary | ICD-10-CM

## 2019-08-24 DIAGNOSIS — G4733 Obstructive sleep apnea (adult) (pediatric): Secondary | ICD-10-CM | POA: Diagnosis not present

## 2019-08-25 ENCOUNTER — Telehealth: Payer: Self-pay | Admitting: Pulmonary Disease

## 2019-08-25 NOTE — Telephone Encounter (Signed)
Spoke with patient. He stated that he was checking to see if we have received the download the from [facility] to fax to [facility]. I advised him that based on his chart, we have not yet. Explained to him that since it is now after 5pm, we will need to call [facility] in the morning to check on the status of the download. He verbalized understanding.

## 2019-08-26 NOTE — Telephone Encounter (Signed)
Called [facility] and spoke with Estill Bamberg asking to have a download sent to our office. Estill Bamberg stated they would fax [place] a 90-day download.  Once download has been received, will call pt to let him know that we have received it so we can then have it sent to [facility].  Will keep encounter open.

## 2019-08-26 NOTE — Telephone Encounter (Signed)
Called [facility] and had them upload his DL to [facility]  I have printed his DL and faxed to [employer] and left detailed msg that this was done

## 2019-09-07 ENCOUNTER — Telehealth: Payer: Self-pay | Admitting: Internal Medicine

## 2019-09-07 ENCOUNTER — Other Ambulatory Visit: Payer: Self-pay

## 2019-09-07 ENCOUNTER — Ambulatory Visit (HOSPITAL_COMMUNITY)
Admission: EM | Admit: 2019-09-07 | Discharge: 2019-09-07 | Disposition: A | Discharge status: Home / Self Care | Payer: BC Managed Care – PPO

## 2019-09-07 ENCOUNTER — Encounter (HOSPITAL_COMMUNITY): Payer: Self-pay

## 2019-09-07 DIAGNOSIS — S39012A Strain of muscle, fascia and tendon of lower back, initial encounter: Secondary | ICD-10-CM | POA: Diagnosis not present

## 2019-09-07 DIAGNOSIS — X500XXA Overexertion from strenuous movement or load, initial encounter: Secondary | ICD-10-CM | POA: Diagnosis not present

## 2019-09-07 MED ORDER — ALBUTEROL SULFATE HFA 108 (90 BASE) MCG/ACT IN AERS: 2.0000 | INHALATION_SPRAY | Freq: Four times a day (QID) | RESPIRATORY_TRACT | 1 refills | Status: DC | PRN

## 2019-09-07 NOTE — ED Triage Notes (Signed)
Pt is here with back pain that started at 10am this morning, states he was at work & he felt something in his back. Pt has not taken any meds to relieve discomfort.

## 2019-09-07 NOTE — Telephone Encounter (Signed)
erx sent as request.

## 2019-09-07 NOTE — Telephone Encounter (Signed)
New message:   1.Medication Requested:  albuterol (PROVENTIL HFA;VENTOLIN HFA) 108 (90 Base) MCG/ACT inhaler 2. Pharmacy (Name, Street, [place]): [id number] [place], [place] 3. On Med List: Yes  4. Last Visit with PCP: 05/29/19  5. Next visit date with PCP:09/08/19   Agent: Please be advised that RX refills may take up to 3 business days. We ask that you follow-up with your pharmacy.

## 2019-09-07 NOTE — Discharge Instructions (Addendum)
This is most likely a muscle strain. You can take 2 Aleve every 12 hours and 2 extra strength Tylenol every 8 hours Alternate heat and ice to the area.  For any continued or worsening problems you need to follow-up. Rest, and work note given

## 2019-09-08 ENCOUNTER — Ambulatory Visit (INDEPENDENT_AMBULATORY_CARE_PROVIDER_SITE_OTHER): Payer: BC Managed Care – PPO | Admitting: Internal Medicine

## 2019-09-08 ENCOUNTER — Encounter: Payer: Self-pay | Admitting: Internal Medicine

## 2019-09-08 ENCOUNTER — Other Ambulatory Visit: Payer: Self-pay

## 2019-09-08 VITALS — BP 144/88 | HR 80 | Temp 98.1°F | Resp 16 | Ht 66.0 in | Wt 263.0 lb

## 2019-09-08 DIAGNOSIS — R739 Hyperglycemia, unspecified: Secondary | ICD-10-CM

## 2019-09-08 DIAGNOSIS — E559 Vitamin D deficiency, unspecified: Secondary | ICD-10-CM

## 2019-09-08 DIAGNOSIS — G4710 Hypersomnia, unspecified: Secondary | ICD-10-CM

## 2019-09-08 DIAGNOSIS — T502X5A Adverse effect of carbonic-anhydrase inhibitors, benzothiadiazides and other diuretics, initial encounter: Secondary | ICD-10-CM

## 2019-09-08 DIAGNOSIS — E876 Hypokalemia: Secondary | ICD-10-CM

## 2019-09-08 DIAGNOSIS — I10 Essential (primary) hypertension: Secondary | ICD-10-CM | POA: Diagnosis not present

## 2019-09-08 DIAGNOSIS — N529 Male erectile dysfunction, unspecified: Secondary | ICD-10-CM

## 2019-09-08 LAB — CBC WITH DIFFERENTIAL/PLATELET
Basophils Absolute: 0.1 10*3/uL (ref 0.0–0.1)
Basophils Relative: 0.5 % (ref 0.0–3.0)
Eosinophils Absolute: 0.3 10*3/uL (ref 0.0–0.7)
Eosinophils Relative: 2.4 % (ref 0.0–5.0)
HCT: 42.9 % (ref 39.0–52.0)
Hemoglobin: 14.2 g/dL (ref 13.0–17.0)
Lymphocytes Relative: 20.1 % (ref 12.0–46.0)
Lymphs Abs: 2.3 10*3/uL (ref 0.7–4.0)
MCHC: 33.1 g/dL (ref 30.0–36.0)
MCV: 88.1 fl (ref 78.0–100.0)
Monocytes Absolute: 0.9 10*3/uL (ref 0.1–1.0)
Monocytes Relative: 7.9 % (ref 3.0–12.0)
Neutro Abs: 7.7 10*3/uL (ref 1.4–7.7)
Neutrophils Relative %: 69.1 % (ref 43.0–77.0)
Platelets: 249 10*3/uL (ref 150.0–400.0)
RBC: 4.87 Mil/uL (ref 4.22–5.81)
RDW: 14.3 % (ref 11.5–15.5)
WBC: 11.2 10*3/uL — ABNORMAL HIGH (ref 4.0–10.5)

## 2019-09-08 LAB — BASIC METABOLIC PANEL
BUN: 16 mg/dL (ref 6–23)
CO2: 26 mEq/L (ref 19–32)
Calcium: 9.2 mg/dL (ref 8.4–10.5)
Chloride: 104 mEq/L (ref 96–112)
Creatinine, Ser: 0.84 mg/dL (ref 0.40–1.50)
GFR: 115.25 mL/min (ref >60.00)
Glucose, Bld: 100 mg/dL — ABNORMAL HIGH (ref 70–99)
Potassium: 4 mEq/L (ref 3.5–5.1)
Sodium: 138 mEq/L (ref 135–145)

## 2019-09-08 LAB — VITAMIN D 25 HYDROXY (VIT D DEFICIENCY, FRACTURES): VITD: 73.19 ng/mL (ref 30.00–100.00)

## 2019-09-08 MED ORDER — CHOLECALCIFEROL 50 MCG (2000 UT) PO TABS: 1.0000 | ORAL_TABLET | Freq: Every day | ORAL | 1 refills | Status: DC

## 2019-09-08 MED ORDER — ARMODAFINIL 250 MG PO TABS: 250.0000 mg | ORAL_TABLET | Freq: Every day | ORAL | 1 refills | Status: DC

## 2019-09-08 MED ORDER — INDAPAMIDE 1.25 MG PO TABS: 1.2500 mg | ORAL_TABLET | Freq: Every day | ORAL | 1 refills | Status: DC

## 2019-09-08 MED ORDER — TADALAFIL 20 MG PO TABS: 20.0000 mg | ORAL_TABLET | Freq: Every day | ORAL | 5 refills | Status: DC | PRN

## 2019-09-08 NOTE — Patient Instructions (Signed)

## 2019-09-08 NOTE — ED Provider Notes (Signed)
[facility]    CSN: [id number] Arrival date & time: 09/07/19  1418      History   Chief Complaint Chief Complaint  Patient presents with  . Back Pain    HPI [name] is a 54 y.o. male.   Patient is a 54 year old male who presents today with lower back pain.  This started approximately 10:00 this morning while he was at work and felt something pop in his back.  He does a lot of heavy lifting and pulling and pushing at work.  Since this happened he has not take any medication to relieve his symptoms.  Pain is located to the left lower back area.  There is no radiation of pain, numbness, tingling, loss of bowel or bladder function.  Fevers, chills, dysuria, hematuria urinary frequency.  Pain is worsened with certain movements to include twisting, sitting and standing.  ROS per HPI      Past Medical History:  Diagnosis Date  . Anxiety   . Arthritis    knees - no med  . Cluster headache    Hx - resolved per patient  . Depression   . DVT of lower extremity (deep venous thrombosis) ([facility]) 6/26-28/2013   Xarelto - resolved - history  . HTN (hypertension)    currently no meds  . Irritability and anger    PMH of  . Kidney stone    passed stone, no surgery required.  . Rotator cuff arthropathy 2007   right  . Saddle pulmonary embolus ([facility]) 6/26-28/2013   post orthopedic surgery - resolved  . Sleep apnea 5/15   moderate OSA-has cpap but does not use it    Patient Active Problem List   Diagnosis Date Noted  . Excessive somnolence disorder 05/09/2019  . AC (acromioclavicular) arthritis 05/03/2019  . Subacromial bursitis of right shoulder joint 05/03/2019  . Diuretic-induced hypokalemia 02/03/2019  . Ejection fraction < 50% 02/03/2019  . Vitamin D deficiency disease 11/03/2018  . PUD (peptic ulcer disease) 01/20/2018  . Renal stone 08/26/2017  . Morbid obesity ([facility]) 08/25/2017  . GERD with esophagitis 08/24/2017  . Degenerative arthritis of knee,  bilateral 06/08/2017  . Venous insufficiency of both lower extremities 10/05/2015  . Routine general medical examination at a health care facility 08/21/2015  . Heterozygous factor V Leiden mutation ([facility]) 08/24/2013  . OSA (obstructive sleep apnea) 08/13/2013  . Hyperglycemia 11/10/2008  . Mixed hyperlipidemia 07/06/2008  . Essential hypertension 07/06/2008  . ERECTILE DYSFUNCTION, ORGANIC 01/28/2007    Past Surgical History:  Procedure Laterality Date  . arthroscopic knee surgery  10/09/2011   Dr Ninfa Linden  . CARDIAC CATHETERIZATION  2007   LVdysfunction; normal coronaries  . KNEE ARTHROSCOPY WITH MEDIAL MENISECTOMY Left 09/20/2014   medial menisectomy chondroplastytella medial plica excision  ;  Surgeon: Dorna Leitz, MD;  Location: [facility];  Service: Orthopedics;  Laterality: Left;  . ROTATOR CUFF REPAIR  2007   right  . UPPER GASTROINTESTINAL ENDOSCOPY  2007   normal  . WISDOM TOOTH EXTRACTION         Home Medications    Prior to Admission medications   Medication Sig Start Date End Date Taking? Authorizing Provider  albuterol (VENTOLIN HFA) 108 (90 Base) MCG/ACT inhaler Inhale 2 puffs into the lungs every 6 (six) hours as needed for wheezing or shortness of breath. 09/07/19   [name], MD  Armodafinil 150 MG tablet TAKE 1 TABLET EVERY DAY 08/10/19   [name], MD  Cholecalciferol 50 MCG (2000 UT) TABS Take 2 tablets (4,000 Units total) by mouth daily. 05/10/19   [name], MD  fluticasone (FLONASE) 50 MCG/ACT nasal spray Place 1 spray into both nostrils daily. 12/16/18   [provider]  indapamide (LOZOL) 1.25 MG tablet Take 1 tablet (1.25 mg total) by mouth daily. 11/05/18   [name], MD  KLOR-CON M20 20 MEQ tablet TAKE 1 TABLET BY MOUTH TWICE A DAY 07/31/19   [name], MD  meloxicam (MOBIC) 15 MG tablet Take 15 mg by mouth daily. 08/02/19   [provider]  Multiple Vitamin (MULTIVITAMIN WITH MINERALS) TABS tablet  Take 1 tablet by mouth daily.    [provider]  pantoprazole (PROTONIX) 40 MG tablet Take 1 tablet (40 mg total) by mouth daily. 11/01/18   [name], MD  predniSONE (DELTASONE) 50 MG tablet Take 1 tablet (50 mg total) by mouth daily. 07/29/19   Lyndal Pulley, DO  predniSONE (DELTASONE) 50 MG tablet Take one tablet daily for the next 5 days. 07/29/19   Lyndal Pulley, DO  rivaroxaban (XARELTO) 10 MG TABS tablet Take 1 tablet (10 mg total) by mouth daily. 05/09/19   [name], MD  sildenafil (REVATIO) 20 MG tablet Take 3 tablets (60 mg total) by mouth daily as needed. 07/12/19   [name], MD  Vitamin D, Ergocalciferol, (DRISDOL) 1.25 MG (50000 UNIT) CAPS capsule Take 1 capsule (50,000 Units total) by mouth every 7 (seven) days. 06/13/19   Lyndal Pulley, DO  potassium chloride SA (KLOR-CON) 20 MEQ tablet Take 1 tablet (20 mEq total) by mouth 2 (two) times daily. 05/10/19   [name], MD    Family History Family History  Problem Relation Age of Onset  . Cancer Mother        Mesothelioma   . Diabetes Brother   . Hypertension Brother   . Stroke Brother 62  . Heart attack Brother 13  . Diabetes Maternal Aunt   . Diabetes Maternal Grandmother   . Heart disease Maternal Grandmother   . Hypertension Maternal Grandmother   . Stroke Maternal Grandmother        in 65s  . Deep vein thrombosis Maternal Grandmother   . Stroke Maternal Grandfather        > 55  . Migraines Brother   . Heart attack Brother 48  . Healthy Father   . Colon cancer Neg Hx   . Esophageal cancer Neg Hx   . Rectal cancer Neg Hx   . Stomach cancer Neg Hx   . Liver cancer Neg Hx   . Pancreatic cancer Neg Hx     Social History Social History   Tobacco Use  . Smoking status: Former Smoker    Types: Cigars  . Smokeless tobacco: Never Used  . Tobacco comment: every 3-4 months  Substance Use Topics  . Alcohol use: Yes    Alcohol/week: 0.0 standard drinks    Comment: drinks  occasionally  . Drug use: No     Allergies   Tramadol   Review of Systems Review of Systems   Physical Exam Triage Vital Signs ED Triage Vitals  Enc Vitals Group     BP 09/07/19 1457 (!) 148/84     Pulse Rate 09/07/19 1457 88     Resp 09/07/19 1457 19     Temp 09/07/19 1457 98.5 F (36.9 C)     Temp Source 09/07/19 1457 Oral  SpO2 09/07/19 1457 98 %     Weight 09/07/19 1454 269 lb (122 kg)     Height --      Head Circumference --      Peak Flow --      Pain Score 09/07/19 1453 9     Pain Loc --      Pain Edu? --      Excl. in [place]? --    No data found.  Updated Vital Signs BP (!) 148/84 (BP Location: Right Arm)   Pulse 88   Temp 98.5 F (36.9 C) (Oral)   Resp 19   Wt 269 lb (122 kg)   SpO2 98%   BMI 43.42 kg/m   Visual Acuity Right Eye Distance:   Left Eye Distance:   Bilateral Distance:    Right Eye Near:   Left Eye Near:    Bilateral Near:     Physical Exam Vitals and nursing note reviewed.  Constitutional:      Appearance: Normal appearance.  HENT:     Head: Normocephalic and atraumatic.     Nose: Nose normal.  Eyes:     Conjunctiva/sclera: Conjunctivae normal.  Pulmonary:     Effort: Pulmonary effort is normal.  Musculoskeletal:     Cervical back: Normal range of motion.     Lumbar back: Tenderness present. No bony tenderness. Decreased range of motion. Negative right straight leg raise test and negative left straight leg raise test.       Back:  Skin:    General: Skin is warm and dry.  Neurological:     Mental Status: He is alert.  Psychiatric:        Mood and Affect: Mood normal.      UC Treatments / Results  Labs (all labs ordered are listed, but only abnormal results are displayed) Labs Reviewed - No data to display  EKG   Radiology No results found.  Procedures Procedures (including critical care time)  Medications Ordered in UC Medications - No data to display  Initial Impression / Assessment and Plan / UC  Course  I have reviewed the triage vital signs and the nursing notes.  Pertinent labs & imaging results that were available during my care of the patient were reviewed by me and considered in my medical decision making (see chart for details).     Lumbar strain-treating with Aleve every 12 hours and after strength Tylenol every 8 hours. Recommended rest, alternate heat and ice. Work note given to rest Final Clinical Impressions(s) / UC Diagnoses   Final diagnoses:  Strain of lumbar region, initial encounter     Discharge Instructions     This is most likely a muscle strain. You can take 2 Aleve every 12 hours and 2 extra strength Tylenol every 8 hours Alternate heat and ice to the area.  For any continued or worsening problems you need to follow-up. Rest, and work note given     ED Prescriptions    None     PDMP not reviewed this encounter.   Loura Halt A, NP 09/08/19 1155

## 2019-09-08 NOTE — Progress Notes (Signed)
Subjective:  Patient ID: [name], male    DOB: March 06, 1966  Age: 54 y.o. MRN: [id number]  CC: Hypertension  This visit occurred during the SARS-CoV-2 public health emergency.  Safety protocols were in place, including screening questions prior to the visit, additional usage of staff PPE, and extensive cleaning of exam room while observing appropriate contact time as indicated for disinfecting solutions.    HPI [name] presents for f/up - He has not been taking indapamide because he has been concerned that it wold cause ED.  For the erectile dysfunction he has tried generic sildenafil but says is not very effective.  He does not monitor his blood pressure.  He is active and denies any recent episodes of chest pain, shortness of breath, palpitations, edema, or fatigue.  He has intentionally lost 14 pounds with lifestyle modifications.  He continues to complain of excessive daytime sleepiness and wants to try a higher dosage of armodafinil.  Outpatient Medications Prior to Visit  Medication Sig Dispense Refill  . albuterol (VENTOLIN HFA) 108 (90 Base) MCG/ACT inhaler Inhale 2 puffs into the lungs every 6 (six) hours as needed for wheezing or shortness of breath. 18 g 1  . fluticasone (FLONASE) 50 MCG/ACT nasal spray Place 1 spray into both nostrils daily.    [name] KLOR-CON M20 20 MEQ tablet TAKE 1 TABLET BY MOUTH TWICE A DAY 180 tablet 0  . Multiple Vitamin (MULTIVITAMIN WITH MINERALS) TABS tablet Take 1 tablet by mouth daily.    . pantoprazole (PROTONIX) 40 MG tablet Take 1 tablet (40 mg total) by mouth daily. 90 tablet 1  . rivaroxaban (XARELTO) 10 MG TABS tablet Take 1 tablet (10 mg total) by mouth daily. 90 tablet 1  . sildenafil (REVATIO) 20 MG tablet Take 3 tablets (60 mg total) by mouth daily as needed. 60 tablet 5  . Armodafinil 150 MG tablet TAKE 1 TABLET EVERY DAY 90 tablet 0  . Cholecalciferol 50 MCG (2000 UT) TABS Take 2 tablets (4,000 Units total) by mouth daily. 180  tablet 1  . indapamide (LOZOL) 1.25 MG tablet Take 1 tablet (1.25 mg total) by mouth daily. 90 tablet 0  . meloxicam (MOBIC) 15 MG tablet Take 15 mg by mouth daily.    . predniSONE (DELTASONE) 50 MG tablet Take 1 tablet (50 mg total) by mouth daily. 5 tablet 0  . predniSONE (DELTASONE) 50 MG tablet Take one tablet daily for the next 5 days. 5 tablet 0  . Vitamin D, Ergocalciferol, (DRISDOL) 1.25 MG (50000 UNIT) CAPS capsule Take 1 capsule (50,000 Units total) by mouth every 7 (seven) days. 12 capsule 0   No facility-administered medications prior to visit.    ROS Review of Systems  Constitutional: Positive for fatigue. Negative for diaphoresis and unexpected weight change.  HENT: Negative.   Eyes: Negative for visual disturbance.  Respiratory: Negative for cough, chest tightness, shortness of breath and wheezing.   Cardiovascular: Negative for chest pain, palpitations and leg swelling.  Gastrointestinal: Negative for abdominal pain, constipation, diarrhea, nausea and vomiting.  Endocrine: Negative.   Genitourinary: Negative for difficulty urinating and hematuria.       +ED  Musculoskeletal: Positive for arthralgias. Negative for myalgias.  Skin: Negative.  Negative for color change and pallor.  Neurological: Negative.  Negative for dizziness, weakness and light-headedness.  Hematological: Negative.   Psychiatric/Behavioral: Negative.     Objective:  BP (!) 144/88 (BP Location: Left Arm, Patient Position: Sitting, Cuff Size: Large)   Pulse  80   Temp 98.1 F (36.7 C) (Oral)   Resp 16   Ht 5\' 6"  (1.676 m)   Wt 263 lb (119.3 kg)   SpO2 98%   BMI 42.45 kg/m   BP Readings from Last 3 Encounters:  09/08/19 (!) 144/88  09/07/19 (!) 148/84  07/29/19 110/72    Wt Readings from Last 3 Encounters:  09/08/19 263 lb (119.3 kg)  09/07/19 269 lb (122 kg)  07/29/19 272 lb (123.4 kg)    Physical Exam Vitals reviewed.  Constitutional:      Appearance: Normal appearance.  HENT:      Nose: Nose normal.     Mouth/Throat:     Mouth: Mucous membranes are moist.  Eyes:     General: No scleral icterus.    Pupils: Pupils are equal, round, and reactive to light.  Cardiovascular:     Rate and Rhythm: Normal rate and regular rhythm.     Heart sounds: No murmur.  Pulmonary:     Effort: Pulmonary effort is normal.     Breath sounds: No stridor. No wheezing, rhonchi or rales.  Abdominal:     General: Abdomen is flat. Bowel sounds are normal. There is no distension.     Palpations: Abdomen is soft. There is no hepatomegaly, splenomegaly or mass.     Tenderness: There is no abdominal tenderness.  Musculoskeletal:        General: Normal range of motion.     Cervical back: Neck supple.     Right lower leg: No edema.     Left lower leg: No edema.  Lymphadenopathy:     Cervical: No cervical adenopathy.  Skin:    General: Skin is warm and dry.     Coloration: Skin is not pale.  Neurological:     General: No focal deficit present.     Mental Status: He is alert.     Lab Results  Component Value Date   WBC 11.2 (H) 09/08/2019   HGB 14.2 09/08/2019   HCT 42.9 09/08/2019   PLT 249.0 09/08/2019   GLUCOSE 100 (H) 09/08/2019   CHOL 154 11/02/2018   TRIG 84.0 11/02/2018   HDL 49.10 11/02/2018   LDLCALC 88 11/02/2018   ALT 23 02/01/2019   AST 18 02/01/2019   NA 138 09/08/2019   K 4.0 09/08/2019   CL 104 09/08/2019   CREATININE 0.84 09/08/2019   BUN 16 09/08/2019   CO2 26 09/08/2019   TSH 1.36 11/02/2018   PSA 0.37 11/02/2018   INR CANCELED 08/12/2013   HGBA1C 6.1 05/09/2019    No results found.  Assessment & Plan:   [name] was seen today for hypertension.  Diagnoses and all orders for this visit:  Essential hypertension- His blood pressure is not adequately well controlled.  I have encouraged him to be compliant with indapamide. -     indapamide (LOZOL) 1.25 MG tablet; Take 1 tablet (1.25 mg total) by mouth daily. -     Basic metabolic panel; Future -      CBC with Differential/Platelet; Future -     CBC with Differential/Platelet -     Basic metabolic panel  Hyperglycemia  Vitamin D deficiency disease- His vitamin D level is in the high normal range.  I recommended that he decrease his vitamin D dosage to 2000 international units once a day. -     VITAMIN D 25 Hydroxy (Vit-D Deficiency, Fractures); Future -     VITAMIN D 25 Hydroxy (Vit-D Deficiency,  Fractures) -     Cholecalciferol 50 MCG (2000 UT) TABS; Take 1 tablet (2,000 Units total) by mouth daily.  Diuretic-induced hypokalemia -     Basic metabolic panel; Future -     Basic metabolic panel  Excessive somnolence disorder -     Armodafinil 250 MG tablet; Take 1 tablet (250 mg total) by mouth daily.  ERECTILE DYSFUNCTION, ORGANIC -     tadalafil (CIALIS) 20 MG tablet; Take 1 tablet (20 mg total) by mouth daily as needed for erectile dysfunction.   I have discontinued Cashmir Hertzog. Stgermaine's Cholecalciferol, Vitamin D (Ergocalciferol), predniSONE, predniSONE, Armodafinil, and meloxicam. I am also having him start on tadalafil, Armodafinil, and Cholecalciferol. Additionally, I am having him maintain his multivitamin with minerals, pantoprazole, fluticasone, rivaroxaban, sildenafil, Klor-Con M20, albuterol, and indapamide.  Meds ordered this encounter  Medications  . tadalafil (CIALIS) 20 MG tablet    Sig: Take 1 tablet (20 mg total) by mouth daily as needed for erectile dysfunction.    Dispense:  6 tablet    Refill:  5  . Armodafinil 250 MG tablet    Sig: Take 1 tablet (250 mg total) by mouth daily.    Dispense:  90 tablet    Refill:  1  . indapamide (LOZOL) 1.25 MG tablet    Sig: Take 1 tablet (1.25 mg total) by mouth daily.    Dispense:  90 tablet    Refill:  1  . Cholecalciferol 50 MCG (2000 UT) TABS    Sig: Take 1 tablet (2,000 Units total) by mouth daily.    Dispense:  90 tablet    Refill:  1     Follow-up: Return in about 4 months (around 01/08/2020).  Scarlette Calico, MD

## 2019-09-09 ENCOUNTER — Encounter: Payer: Self-pay | Admitting: Internal Medicine

## 2019-09-10 ENCOUNTER — Other Ambulatory Visit: Payer: Self-pay | Admitting: Internal Medicine

## 2019-09-13 ENCOUNTER — Other Ambulatory Visit: Payer: Self-pay

## 2019-09-13 ENCOUNTER — Encounter: Payer: Self-pay | Admitting: Family Medicine

## 2019-09-13 ENCOUNTER — Ambulatory Visit (INDEPENDENT_AMBULATORY_CARE_PROVIDER_SITE_OTHER): Payer: BC Managed Care – PPO | Admitting: Family Medicine

## 2019-09-13 ENCOUNTER — Ambulatory Visit (INDEPENDENT_AMBULATORY_CARE_PROVIDER_SITE_OTHER): Payer: BC Managed Care – PPO

## 2019-09-13 VITALS — BP 118/87 | HR 95 | Ht 66.0 in | Wt 267.0 lb

## 2019-09-13 DIAGNOSIS — G8929 Other chronic pain: Secondary | ICD-10-CM | POA: Diagnosis not present

## 2019-09-13 DIAGNOSIS — M19011 Primary osteoarthritis, right shoulder: Secondary | ICD-10-CM

## 2019-09-13 DIAGNOSIS — D6851 Activated protein C resistance: Secondary | ICD-10-CM

## 2019-09-13 DIAGNOSIS — M25511 Pain in right shoulder: Secondary | ICD-10-CM

## 2019-09-13 NOTE — Progress Notes (Signed)
[name]: [phone] Subjective:   [name], am serving as a scribe for Dr. Hulan Saas. This visit occurred during the SARS-CoV-2 public health emergency.  Safety protocols were in place, including screening questions prior to the visit, additional usage of staff PPE, and extensive cleaning of exam room while observing appropriate contact time as indicated for disinfecting solutions.   I'm seeing this patient by the request  of:  [name], MD  CC: right shoulder pain follow up   [id number]   07/29/2019 On ultrasound today shows a chronic problem does have an exacerbation with hypoechoic changes and a positive motion sign.  Patient declined any type of injection and prednisone given today.  Hold on the meloxicam.  Warned of potential side effects.  Follow-up again in 4 to 8 weeks  Bilateral steroid injections given today.  Has responded fairly well previously.  Has discussed the possibility of viscosupplementation with this individual.Chronic problem that is exacerbating.  Social determinants of health include financial constraints and patient having difficulty getting time off work.  Wants to avoid surgical intervention.  Follow-up with me again in 8 to 12 weeks  Update 09/13/2019 [name] is a 54 y.o. male coming in with complaint of right shoulder pain. Pain is constant, dull, achy and sharp. No new injury. Does occasionally have numbness in the tip of his fingers.     MRI of shoulder independently reviewed and viewed by Lyndal Pulley  IMPRESSION: 1. Severe AC joint arthritis with inflammation in and around the joint with prominent bone edema. 2. Tiny focal partial-thickness articular surface rim rent tear of the distal supraspinatus tendon.  Past Medical History:  Diagnosis Date  . Anxiety   . Arthritis    knees - no med  . Cluster headache    Hx - resolved per patient    . Depression   . DVT of lower extremity (deep venous thrombosis) ([place]) 6/26-28/2013   Xarelto - resolved - history  . HTN (hypertension)    currently no meds  . Irritability and anger    PMH of  . Kidney stone    passed stone, no surgery required.  . Rotator cuff arthropathy 2007   right  . Saddle pulmonary embolus ([place]) 6/26-28/2013   post orthopedic surgery - resolved  . Sleep apnea 5/15   moderate OSA-has cpap but does not use it   Past Surgical History:  Procedure Laterality Date  . arthroscopic knee surgery  10/09/2011   Dr Ninfa Linden  . CARDIAC CATHETERIZATION  2007   LVdysfunction; normal coronaries  . KNEE ARTHROSCOPY WITH MEDIAL MENISECTOMY Left 09/20/2014   medial menisectomy chondroplastytella medial plica excision  ;  Surgeon: Dorna Leitz, MD;  Location: [facility];  Service: Orthopedics;  Laterality: Left;  . ROTATOR CUFF REPAIR  2007   right  . UPPER GASTROINTESTINAL ENDOSCOPY  2007   normal  . WISDOM TOOTH EXTRACTION     Social History   Socioeconomic History  . Marital status: Married    Spouse name: Not on file  . Number of children: 6  . Years of education: 22  . Highest education level: Not on file  Occupational History  . Occupation: Glass blower/designer   . Occupation: Programmer, systems  Tobacco Use  . Smoking status: Former Smoker    Types: Cigars  . Smokeless tobacco: Never Used  . Tobacco comment: every 3-4 months  Substance and Sexual Activity  . Alcohol use: Yes    Alcohol/week: 0.0 standard drinks    Comment: drinks occasionally  . Drug use: No  . Sexual activity: Yes    Birth control/protection: Condom  Other Topics Concern  . Not on file  Social History Narrative   Fun: Exercise   Social Determinants of Health   Financial Resource Strain:   . Difficulty of Paying Living Expenses:   Food Insecurity:   . Worried About Charity fundraiser in the Last Year:   . Arboriculturist in the Last Year:   Transportation Needs:    . Film/video editor (Medical):   [name] Lack of Transportation (Non-Medical):   Physical Activity:   . Days of Exercise per Week:   . Minutes of Exercise per Session:   Stress:   . Feeling of Stress :   Social Connections:   . Frequency of Communication with Friends and Family:   . Frequency of Social Gatherings with Friends and Family:   . Attends Religious Services:   . Active Member of Clubs or Organizations:   . Attends Archivist Meetings:   [name] Marital Status:    Allergies  Allergen Reactions  . Tramadol Other (See Comments)    insomnia   Family History  Problem Relation Age of Onset  . Cancer Mother        Mesothelioma   . Diabetes Brother   . Hypertension Brother   . Stroke Brother 103  . Heart attack Brother 40  . Diabetes Maternal Aunt   . Diabetes Maternal Grandmother   . Heart disease Maternal Grandmother   . Hypertension Maternal Grandmother   . Stroke Maternal Grandmother        in 26s  . Deep vein thrombosis Maternal Grandmother   . Stroke Maternal Grandfather        > 55  . Migraines Brother   . Heart attack Brother 67  . Healthy Father   . Colon cancer Neg Hx   . Esophageal cancer Neg Hx   . Rectal cancer Neg Hx   . Stomach cancer Neg Hx   . Liver cancer Neg Hx   . Pancreatic cancer Neg Hx      Current Outpatient Medications (Cardiovascular):  .  indapamide (LOZOL) 1.25 MG tablet, Take 1 tablet (1.25 mg total) by mouth daily. .  sildenafil (REVATIO) 20 MG tablet, Take 3 tablets (60 mg total) by mouth daily as needed. .  tadalafil (CIALIS) 20 MG tablet, Take 1 tablet (20 mg total) by mouth daily as needed for erectile dysfunction.  Current Outpatient Medications (Respiratory):  .  albuterol (VENTOLIN HFA) 108 (90 Base) MCG/ACT inhaler, INHALE 2 PUFFS INTO THE LUNGS EVERY 6 HOURS AS NEEDED FOR WHEEZING OR SHORTNESS OF BREATH .  fluticasone (FLONASE) 50 MCG/ACT nasal spray, Place 1 spray into both nostrils daily.   Current Outpatient  Medications (Hematological):  .  rivaroxaban (XARELTO) 10 MG TABS tablet, Take 1 tablet (10 mg total) by mouth daily.  Current Outpatient Medications (Other):  [name]  Armodafinil 250 MG tablet, Take 1 tablet (250 mg total) by mouth daily. .  Cholecalciferol 50 MCG (2000 UT) TABS, Take 1 tablet (2,000 Units total) by mouth daily. [name]  KLOR-CON M20 20 MEQ tablet, TAKE 1 TABLET BY MOUTH TWICE A DAY .  Multiple Vitamin (MULTIVITAMIN WITH MINERALS) TABS tablet, Take 1 tablet by mouth daily. .  pantoprazole (PROTONIX) 40 MG tablet, Take 1 tablet (40  mg total) by mouth daily.   Reviewed prior external information including notes and imaging from  primary care provider As well as notes that were available from care everywhere and other healthcare systems.  Past medical history, social, surgical and family history all reviewed in electronic medical record.  No pertanent information unless stated regarding to the chief complaint.   Review of Systems:  No headache, visual changes, nausea, vomiting, diarrhea, constipation, dizziness, abdominal pain, skin rash, fevers, chills, night sweats, weight loss, swollen lymph nodes, body aches, joint swelling, chest pain, shortness of breath, mood changes. POSITIVE muscle aches  Objective  Blood pressure 118/87, pulse 95, height 5\' 6"  (1.676 m), weight 267 lb (121.1 kg), SpO2 98 %.   General: No apparent distress alert and oriented x3 mood and affect normal, dressed appropriately.  Obese HEENT: Pupils equal, extraocular movements intact  Respiratory: Patient's speak in full sentences and does not appear short of breath  Cardiovascular: No lower extremity edema, non tender, no erythema  Neuro: Cranial nerves II through XII are intact, neurovascularly intact in all extremities with 2+ DTRs and 2+ pulses.  Gait continues antalgic gait favoring patient's knees  Right shoulder exam shows positive crossover.  Mild positive impingement.  Patient does have tenderness to  palpation of the diffusely of the shoulder.  Voluntary guarding noted.  Procedure: Real-time Ultrasound Guided Injection of right acromioclavicular joint Device: GE Logiq Q7 Ultrasound guided injection is preferred based studies that show increased duration, increased effect, greater accuracy, decreased procedural pain, increased response rate, and decreased cost with ultrasound guided versus blind injection.  Verbal informed consent obtained.  Time-out conducted.  Noted no overlying erythema, induration, or other signs of local infection.  Skin prepped in a sterile fashion.  Local anesthesia: Topical Ethyl chloride.  With sterile technique and under real time ultrasound guidance: With a 25-gauge half inch needle injected with 0.5 cc of 0.5% Marcaine and 0.5 cc of Kenalog 40 mg/mL Completed without difficulty  Pain immediately resolved suggesting accurate placement of the medication.  Advised to call if fevers/chills, erythema, induration, drainage, or persistent bleeding.  Images permanently stored and available for review in the ultrasound unit.  Impression: Technically successful ultrasound guided injection.    Impression and Recommendations:     This case required medical decision making of moderate complexity. The above documentation has been reviewed and is accurate and complete Lyndal Pulley, DO       Note: This dictation was prepared with Dragon dictation along with smaller phrase technology. Any transcriptional errors that result from this process are unintentional.

## 2019-09-13 NOTE — Patient Instructions (Addendum)
Injected AC joint today See me in 6-8 weeks if not better will do another injection

## 2019-09-13 NOTE — Assessment & Plan Note (Signed)
Injection given today, tolerated procedure well, discussed icing regimen and home exercise, which activities to doing which wants to avoid.  Increase activity slowly over the course the next several weeks.  Patient did have some very mild tendinopathy and we could do an intra-articular glenohumeral injection if this continues.  Follow-up again in 4 to 8 weeks

## 2019-09-23 DIAGNOSIS — G4733 Obstructive sleep apnea (adult) (pediatric): Secondary | ICD-10-CM | POA: Diagnosis not present

## 2019-09-24 DIAGNOSIS — G4733 Obstructive sleep apnea (adult) (pediatric): Secondary | ICD-10-CM | POA: Diagnosis not present

## 2019-09-25 ENCOUNTER — Other Ambulatory Visit: Payer: Self-pay | Admitting: Family Medicine

## 2019-09-29 ENCOUNTER — Ambulatory Visit: Payer: BC Managed Care – PPO | Admitting: Family Medicine

## 2019-09-29 NOTE — Progress Notes (Deleted)
[place] Phone: [phone] Subjective:    I'm seeing this patient by the request  of:  [name], MD  CC:   [id number]   09/13/2019 Injection given today, tolerated procedure well, discussed icing regimen and home exercise, which activities to doing which wants to avoid.  Increase activity slowly over the course the next several weeks.  Patient did have some very mild tendinopathy and we could do an intra-articular glenohumeral injection if this continues.  Follow-up again in 4 to 8 weeks  Update 09/29/2019 [name] is a 54 y.o. male coming in with complaint of right shoulder pain. Patient states       Past Medical History:  Diagnosis Date  . Anxiety   . Arthritis    knees - no med  . Cluster headache    Hx - resolved per patient  . Depression   . DVT of lower extremity (deep venous thrombosis) ([place]) 6/26-28/2013   Xarelto - resolved - history  . HTN (hypertension)    currently no meds  . Irritability and anger    PMH of  . Kidney stone    passed stone, no surgery required.  . Rotator cuff arthropathy 2007   right  . Saddle pulmonary embolus ([place]) 6/26-28/2013   post orthopedic surgery - resolved  . Sleep apnea 5/15   moderate OSA-has cpap but does not use it   Past Surgical History:  Procedure Laterality Date  . arthroscopic knee surgery  10/09/2011   Dr Ninfa Linden  . CARDIAC CATHETERIZATION  2007   LVdysfunction; normal coronaries  . KNEE ARTHROSCOPY WITH MEDIAL MENISECTOMY Left 09/20/2014   medial menisectomy chondroplastytella medial plica excision  ;  Surgeon: Dorna Leitz, MD;  Location: [facility];  Service: Orthopedics;  Laterality: Left;  . ROTATOR CUFF REPAIR  2007   right  . UPPER GASTROINTESTINAL ENDOSCOPY  2007   normal  . WISDOM TOOTH EXTRACTION     Social History   Socioeconomic History  . Marital status: Married    Spouse name: Not on file    . Number of children: 6  . Years of education: 33  . Highest education level: Not on file  Occupational History  . Occupation: Glass blower/designer   . Occupation: Programmer, systems  Tobacco Use  . Smoking status: Former Smoker    Types: Cigars  . Smokeless tobacco: Never Used  . Tobacco comment: every 3-4 months  Substance and Sexual Activity  . Alcohol use: Yes    Alcohol/week: 0.0 standard drinks    Comment: drinks occasionally  . Drug use: No  . Sexual activity: Yes    Birth control/protection: Condom  Other Topics Concern  . Not on file  Social History Narrative   Fun: Exercise   Social Determinants of Health   Financial Resource Strain:   . Difficulty of Paying Living Expenses:   Food Insecurity:   . Worried About Charity fundraiser in the Last Year:   . Arboriculturist in the Last Year:   Transportation Needs:   . Film/video editor (Medical):   [name] Lack of Transportation (Non-Medical):   Physical Activity:   . Days of Exercise per Week:   . Minutes of Exercise per Session:   Stress:   . Feeling of Stress :   Social Connections:   . Frequency of Communication with Friends and Family:   . Frequency of Social  Gatherings with Friends and Family:   . Attends Religious Services:   . Active Member of Clubs or Organizations:   . Attends Archivist Meetings:   [name] Marital Status:    Allergies  Allergen Reactions  . Tramadol Other (See Comments)    insomnia   Family History  Problem Relation Age of Onset  . Cancer Mother        Mesothelioma   . Diabetes Brother   . Hypertension Brother   . Stroke Brother 36  . Heart attack Brother 36  . Diabetes Maternal Aunt   . Diabetes Maternal Grandmother   . Heart disease Maternal Grandmother   . Hypertension Maternal Grandmother   . Stroke Maternal Grandmother        in 69s  . Deep vein thrombosis Maternal Grandmother   . Stroke Maternal Grandfather        > 55  . Migraines Brother   . Heart attack  Brother 46  . Healthy Father   . Colon cancer Neg Hx   . Esophageal cancer Neg Hx   . Rectal cancer Neg Hx   . Stomach cancer Neg Hx   . Liver cancer Neg Hx   . Pancreatic cancer Neg Hx      Current Outpatient Medications (Cardiovascular):  .  indapamide (LOZOL) 1.25 MG tablet, Take 1 tablet (1.25 mg total) by mouth daily. .  sildenafil (REVATIO) 20 MG tablet, Take 3 tablets (60 mg total) by mouth daily as needed. .  tadalafil (CIALIS) 20 MG tablet, Take 1 tablet (20 mg total) by mouth daily as needed for erectile dysfunction.  Current Outpatient Medications (Respiratory):  .  albuterol (VENTOLIN HFA) 108 (90 Base) MCG/ACT inhaler, INHALE 2 PUFFS INTO THE LUNGS EVERY 6 HOURS AS NEEDED FOR WHEEZING OR SHORTNESS OF BREATH .  fluticasone (FLONASE) 50 MCG/ACT nasal spray, Place 1 spray into both nostrils daily.   Current Outpatient Medications (Hematological):  .  rivaroxaban (XARELTO) 10 MG TABS tablet, Take 1 tablet (10 mg total) by mouth daily.  Current Outpatient Medications (Other):  [name]  Armodafinil 250 MG tablet, Take 1 tablet (250 mg total) by mouth daily. .  Cholecalciferol 50 MCG (2000 UT) TABS, Take 1 tablet (2,000 Units total) by mouth daily. [name]  KLOR-CON M20 20 MEQ tablet, TAKE 1 TABLET BY MOUTH TWICE A DAY .  Multiple Vitamin (MULTIVITAMIN WITH MINERALS) TABS tablet, Take 1 tablet by mouth daily. .  pantoprazole (PROTONIX) 40 MG tablet, Take 1 tablet (40 mg total) by mouth daily. .  Vitamin D, Ergocalciferol, (DRISDOL) 1.25 MG (50000 UNIT) CAPS capsule, TAKE 1 CAPSULE (50,000 UNITS TOTAL) BY MOUTH EVERY 7 (SEVEN) DAYS.   Reviewed prior external information including notes and imaging from  primary care provider As well as notes that were available from care everywhere and other healthcare systems.  Past medical history, social, surgical and family history all reviewed in electronic medical record.  No pertanent information unless stated regarding to the chief complaint.    Review of Systems:  No headache, visual changes, nausea, vomiting, diarrhea, constipation, dizziness, abdominal pain, skin rash, fevers, chills, night sweats, weight loss, swollen lymph nodes, body aches, joint swelling, chest pain, shortness of breath, mood changes. POSITIVE muscle aches  Objective  There were no vitals taken for this visit.   General: No apparent distress alert and oriented x3 mood and affect normal, dressed appropriately.  HEENT: Pupils equal, extraocular movements intact  Respiratory: Patient's speak in full sentences and does not  appear short of breath  Cardiovascular: No lower extremity edema, non tender, no erythema  Neuro: Cranial nerves II through XII are intact, neurovascularly intact in all extremities with 2+ DTRs and 2+ pulses.  Gait normal with good balance and coordination.  MSK:  Non tender with full range of motion and good stability and symmetric strength and tone of shoulders, elbows, wrist, hip, knee and ankles bilaterally.     Impression and Recommendations:     This case required medical decision making of moderate complexity. The above documentation has been reviewed and is accurate and complete Jacqualin Combes       Note: This dictation was prepared with Dragon dictation along with smaller phrase technology. Any transcriptional errors that result from this process are unintentional.

## 2019-10-23 ENCOUNTER — Other Ambulatory Visit: Payer: Self-pay | Admitting: Internal Medicine

## 2019-10-23 DIAGNOSIS — I10 Essential (primary) hypertension: Secondary | ICD-10-CM

## 2019-10-23 DIAGNOSIS — E876 Hypokalemia: Secondary | ICD-10-CM

## 2019-10-23 DIAGNOSIS — T502X5A Adverse effect of carbonic-anhydrase inhibitors, benzothiadiazides and other diuretics, initial encounter: Secondary | ICD-10-CM

## 2019-10-24 DIAGNOSIS — H10023 Other mucopurulent conjunctivitis, bilateral: Secondary | ICD-10-CM | POA: Diagnosis not present

## 2019-10-24 DIAGNOSIS — G4733 Obstructive sleep apnea (adult) (pediatric): Secondary | ICD-10-CM | POA: Diagnosis not present

## 2019-10-24 IMAGING — CT CT ANGIO CHEST
2 of 7 series · 19 of 46 positions shown · IV contrast (APPLIED)
Comparison: Chest x-ray from earlier in the same day, ultrasound
from 01/23/2017 and MRI from 03/29/2017.

CLINICAL DATA: Chest pain for several hours

EXAM:
CT ANGIOGRAPHY CHEST WITH CONTRAST
TECHNIQUE: Multidetector CT imaging of the chest was performed using the
standard protocol during bolus administration of intravenous
contrast. Multiplanar CT image reconstructions and MIPs were
obtained to evaluate the vascular anatomy.
CONTRAST:  100 mL 1KOU7C-0ZE IOPAMIDOL (1KOU7C-0ZE) INJECTION 76%

[Series 7: thins · axial · 0.82mm/px · z∈[+1147,+1396]mm · 16 of 402 slices shown]
[im 23/402  lung]
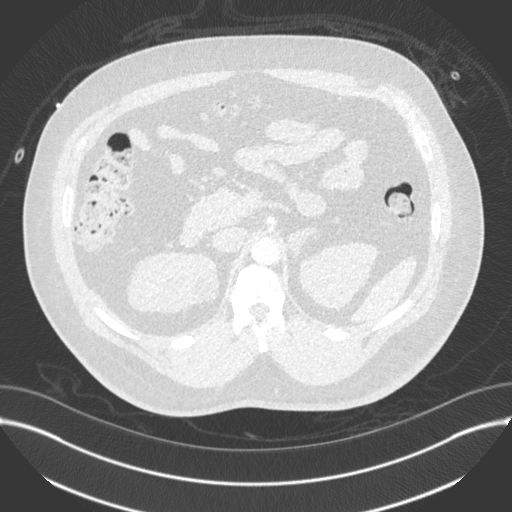
[im 45/402  soft-tissue]
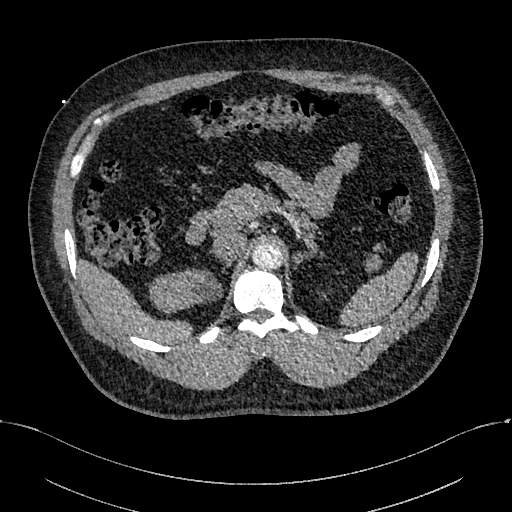
[im 67/402  lung]
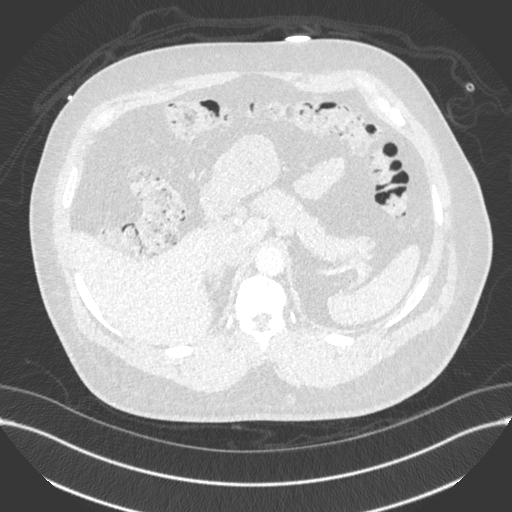
[im 90/402  soft-tissue]
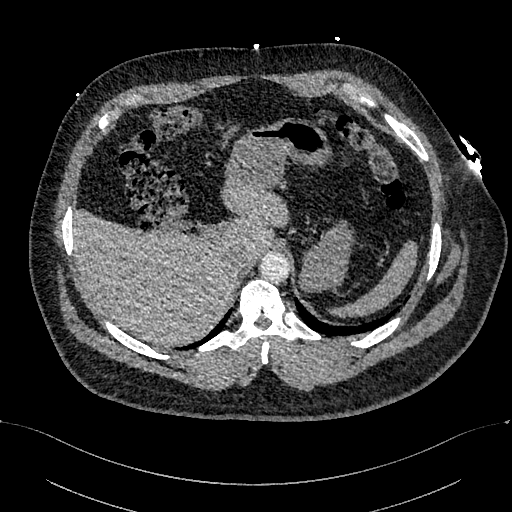
[im 112/402  lung]
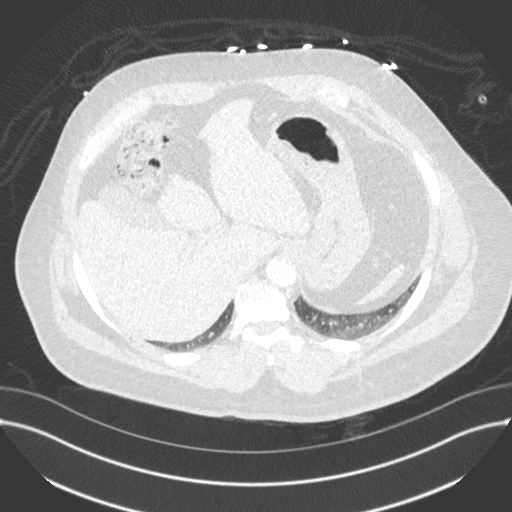
[im 134/402  soft-tissue]
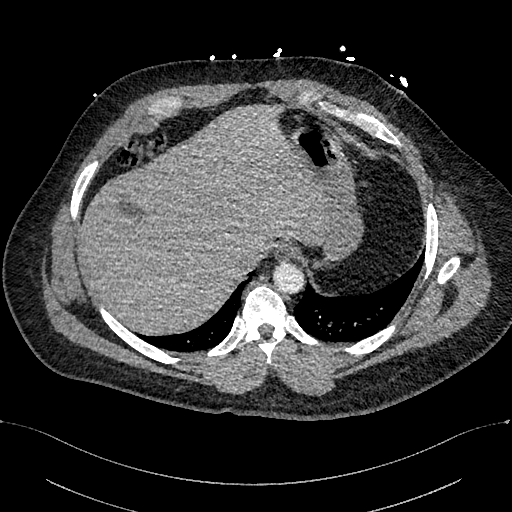
[im 156/402  lung]
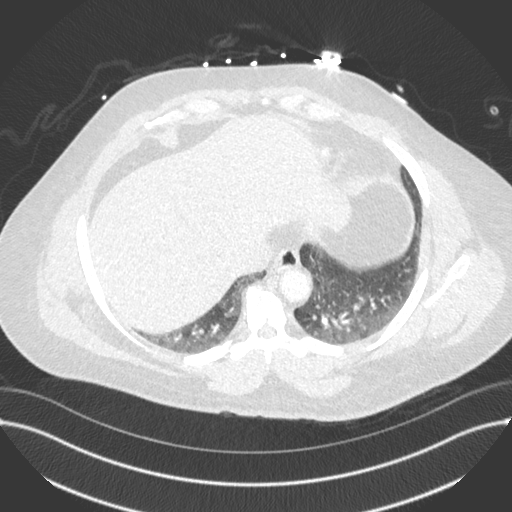
[im 179/402  soft-tissue]
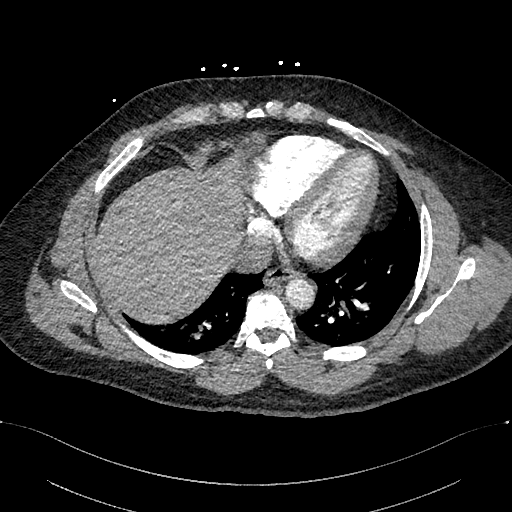
[im 223/402  lung]
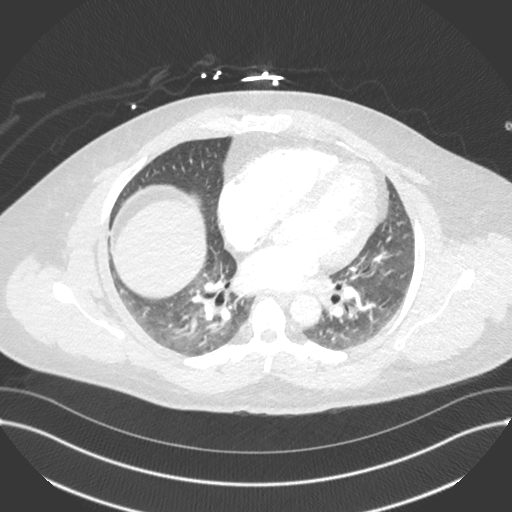
[im 246/402  soft-tissue]
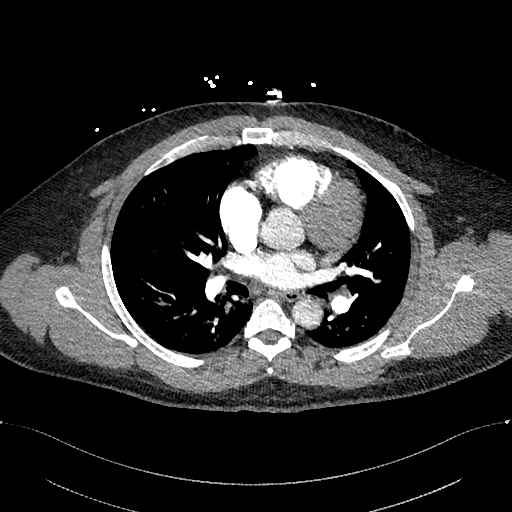
[im 268/402  lung]
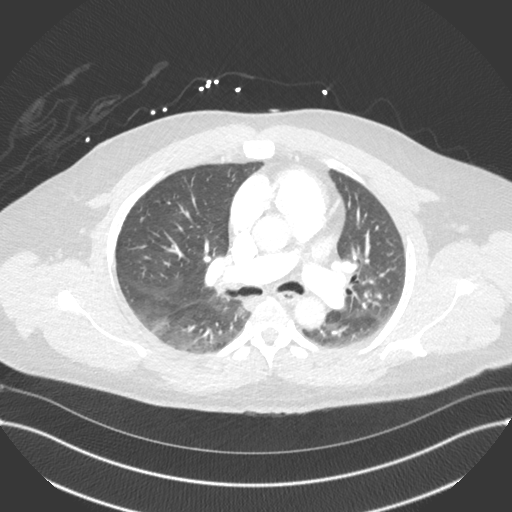
[im 290/402  soft-tissue]
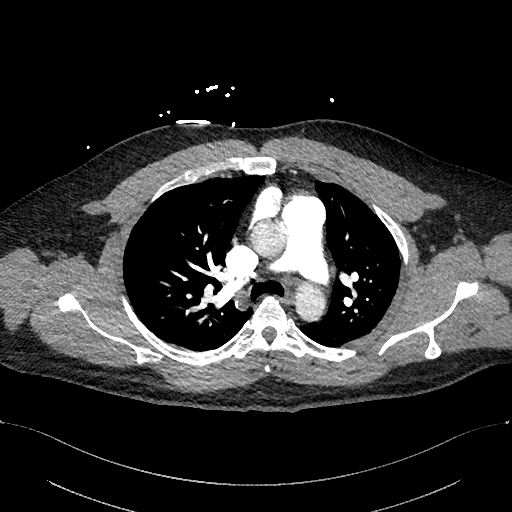
[im 312/402  lung]
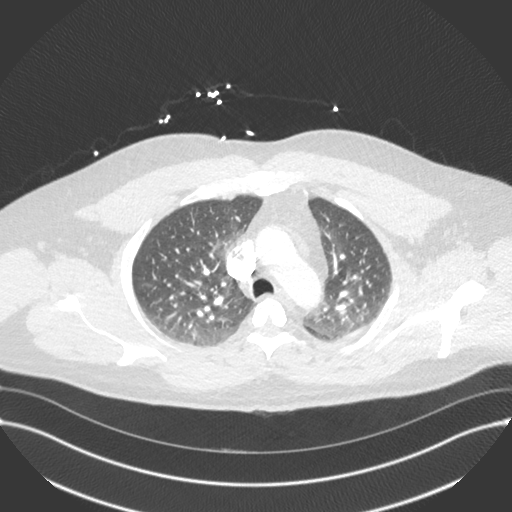
[im 335/402  soft-tissue]
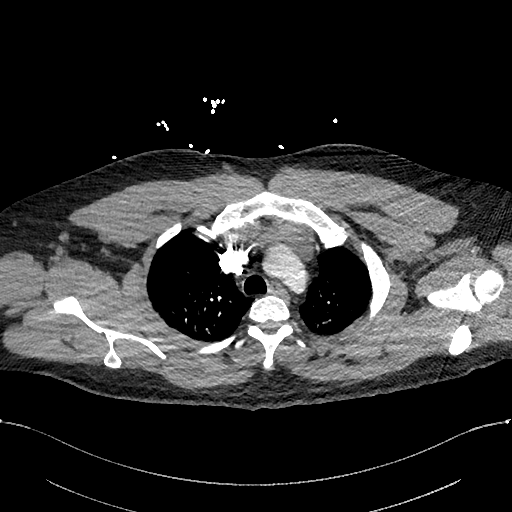
[im 357/402  lung]
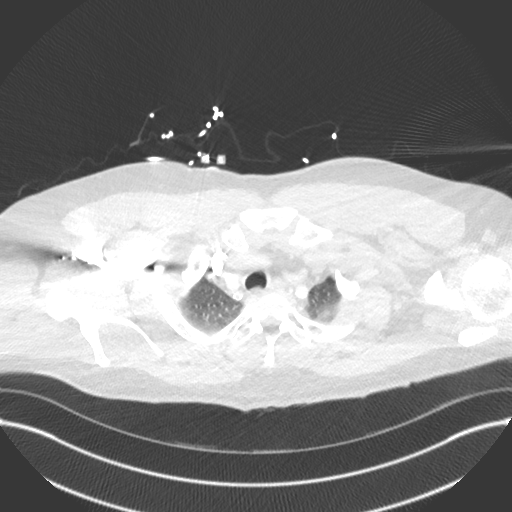
[im 379/402  soft-tissue]
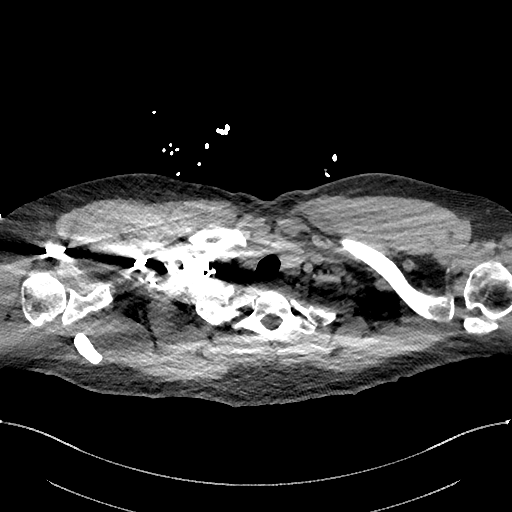

[Series 8: cor · coronal · 0.62mm/px · 3 of 151 slices shown]
[im 38/151  soft-tissue]
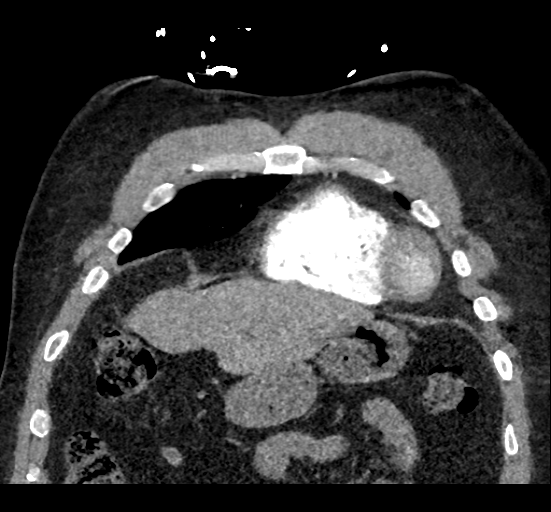
[im 76/151  soft-tissue]
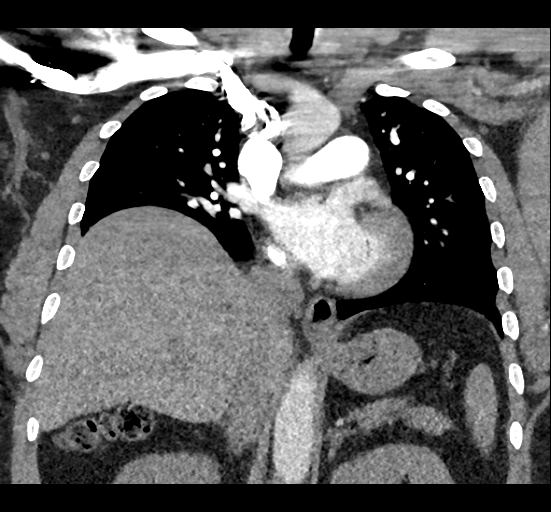
[im 113/151  soft-tissue]
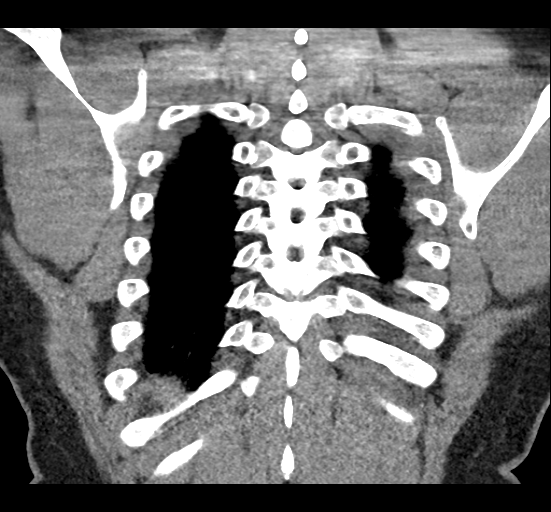

[19 of 46 positions shown; findings below may reference images not displayed]

FINDINGS: Cardiovascular: Thoracic aorta demonstrates no significant
atherosclerotic calcifications. Mild coronary calcifications are
seen. No cardiac enlargement is noted. The pulmonary artery
demonstrates a normal branching pattern. No filling defects to
suggest pulmonary emboli are identified.

Mediastinum/Nodes: Thoracic inlet is within normal limits. No
sizable hilar or mediastinal adenopathy is noted. The esophagus is
within normal limits as visualized.

Lungs/Pleura: Mild dependent atelectatic changes are noted. No focal
confluent infiltrate or sizable effusion is seen.

Upper Abdomen: Hypodense lesion is noted in the upper pole of the
right kidney medially.

Musculoskeletal: Degenerative changes of thoracic spine are noted.

Review of the MIP images confirms the above findings.
IMPRESSION: No evidence of pulmonary emboli.

Hypodense lesion in the right kidney stable over multiple previous
exams consistent with a benign cyst.

## 2019-11-03 ENCOUNTER — Ambulatory Visit (INDEPENDENT_AMBULATORY_CARE_PROVIDER_SITE_OTHER): Payer: BC Managed Care – PPO | Admitting: Family Medicine

## 2019-11-03 ENCOUNTER — Other Ambulatory Visit: Payer: Self-pay

## 2019-11-03 VITALS — BP 120/80 | HR 113 | Ht 66.0 in | Wt 267.0 lb

## 2019-11-03 DIAGNOSIS — M7551 Bursitis of right shoulder: Secondary | ICD-10-CM | POA: Diagnosis not present

## 2019-11-03 DIAGNOSIS — M170 Bilateral primary osteoarthritis of knee: Secondary | ICD-10-CM | POA: Diagnosis not present

## 2019-11-03 NOTE — Progress Notes (Signed)
[name] Phone: [phone] Subjective:   [name], am serving as a scribe for Dr. Hulan Saas. This visit occurred during the SARS-CoV-2 public health emergency.  Safety protocols were in place, including screening questions prior to the visit, additional usage of staff PPE, and extensive cleaning of exam room while observing appropriate contact time as indicated for disinfecting solutions.   I'm seeing this patient by the request  of:  [name] Lima, MD  CC:   [id number]   09/13/2019 Injection given today, tolerated procedure well, discussed icing regimen and home exercise, which activities to doing which wants to avoid.  Increase activity slowly over the course the next several weeks.  Patient did have some very mild tendinopathy and we could do an intra-articular glenohumeral injection if this continues.  Follow-up again in 4 to 8 weeks  Update 11/03/2019 [name] is a 54 y.o. male coming in with complaint of right shoulder pain. Had 2 weeks of relief from injection until pain came back.   Also having bilateral knee pain. Would like injections today patient has known arthritic changes.  Patient continues to want to avoid any surgical intervention.  Did not respond as well to the viscosupplementation.  Feels steroid injections seem to be more helpful     Past Medical History:  Diagnosis Date  . Anxiety   . Arthritis    knees - no med  . Cluster headache    Hx - resolved per patient  . Depression   . DVT of lower extremity (deep venous thrombosis) ([place]) 6/26-28/2013   Xarelto - resolved - history  . HTN (hypertension)    currently no meds  . Irritability and anger    PMH of  . Kidney stone    passed stone, no surgery required.  . Rotator cuff arthropathy 2007   right  . Saddle pulmonary embolus ([place]) 6/26-28/2013   post orthopedic surgery - resolved  . Sleep apnea 5/15   moderate OSA-has cpap but does not use it   Past Surgical History:  Procedure Laterality Date  . arthroscopic knee surgery  10/09/2011   Dr Ninfa Linden  . CARDIAC CATHETERIZATION  2007   LVdysfunction; normal coronaries  . KNEE ARTHROSCOPY WITH MEDIAL MENISECTOMY Left 09/20/2014   medial menisectomy chondroplastytella medial plica excision  ;  Surgeon: Dorna Leitz, MD;  Location: [facility];  Service: Orthopedics;  Laterality: Left;  . ROTATOR CUFF REPAIR  2007   right  . UPPER GASTROINTESTINAL ENDOSCOPY  2007   normal  . WISDOM TOOTH EXTRACTION     Social History   Socioeconomic History  . Marital status: Married    Spouse name: Not on file  . Number of children: 6  . Years of education: 16  . Highest education level: Not on file  Occupational History  . Occupation: Glass blower/designer   . Occupation: Programmer, systems  Tobacco Use  . Smoking status: Former Smoker    Types: Cigars  . Smokeless tobacco: Never Used  . Tobacco comment: every 3-4 months  Substance and Sexual Activity  . Alcohol use: Yes    Alcohol/week: 0.0 standard drinks    Comment: drinks occasionally  . Drug use: No  . Sexual activity: Yes    Birth control/protection: Condom  Other Topics Concern  . Not on file  Social History Narrative   Fun: Exercise   Social Determinants of Radio broadcast assistant  Strain:   . Difficulty of Paying Living Expenses:   Food Insecurity:   . Worried About Charity fundraiser in the Last Year:   . Arboriculturist in the Last Year:   Transportation Needs:   . Film/video editor (Medical):   [name] Lack of Transportation (Non-Medical):   Physical Activity:   . Days of Exercise per Week:   . Minutes of Exercise per Session:   Stress:   . Feeling of Stress :   Social Connections:   . Frequency of Communication with Friends and Family:   . Frequency of Social Gatherings with Friends and Family:   . Attends Religious Services:   . Active Member of Clubs  or Organizations:   . Attends Archivist Meetings:   [name] Marital Status:    Allergies  Allergen Reactions  . Tramadol Other (See Comments)    insomnia   Family History  Problem Relation Age of Onset  . Cancer Mother        Mesothelioma   . Diabetes Brother   . Hypertension Brother   . Stroke Brother 63  . Heart attack Brother 57  . Diabetes Maternal Aunt   . Diabetes Maternal Grandmother   . Heart disease Maternal Grandmother   . Hypertension Maternal Grandmother   . Stroke Maternal Grandmother        in 19s  . Deep vein thrombosis Maternal Grandmother   . Stroke Maternal Grandfather        > 55  . Migraines Brother   . Heart attack Brother 57  . Healthy Father   . Colon cancer Neg Hx   . Esophageal cancer Neg Hx   . Rectal cancer Neg Hx   . Stomach cancer Neg Hx   . Liver cancer Neg Hx   . Pancreatic cancer Neg Hx      Current Outpatient Medications (Cardiovascular):  .  indapamide (LOZOL) 1.25 MG tablet, Take 1 tablet (1.25 mg total) by mouth daily. .  sildenafil (REVATIO) 20 MG tablet, Take 3 tablets (60 mg total) by mouth daily as needed. .  tadalafil (CIALIS) 20 MG tablet, Take 1 tablet (20 mg total) by mouth daily as needed for erectile dysfunction.  Current Outpatient Medications (Respiratory):  .  albuterol (VENTOLIN HFA) 108 (90 Base) MCG/ACT inhaler, INHALE 2 PUFFS INTO THE LUNGS EVERY 6 HOURS AS NEEDED FOR WHEEZING OR SHORTNESS OF BREATH .  fluticasone (FLONASE) 50 MCG/ACT nasal spray, Place 1 spray into both nostrils daily.   Current Outpatient Medications (Hematological):  .  rivaroxaban (XARELTO) 10 MG TABS tablet, Take 1 tablet (10 mg total) by mouth daily.  Current Outpatient Medications (Other):  [name]  Armodafinil 250 MG tablet, Take 1 tablet (250 mg total) by mouth daily. .  Cholecalciferol 50 MCG (2000 UT) TABS, Take 1 tablet (2,000 Units total) by mouth daily. [name]  KLOR-CON M20 20 MEQ tablet, TAKE 1 TABLET BY MOUTH TWICE A DAY .  Multiple  Vitamin (MULTIVITAMIN WITH MINERALS) TABS tablet, Take 1 tablet by mouth daily. .  pantoprazole (PROTONIX) 40 MG tablet, Take 1 tablet (40 mg total) by mouth daily. .  Vitamin D, Ergocalciferol, (DRISDOL) 1.25 MG (50000 UNIT) CAPS capsule, TAKE 1 CAPSULE (50,000 UNITS TOTAL) BY MOUTH EVERY 7 (SEVEN) DAYS.   Reviewed prior external information including notes and imaging from  primary care provider As well as notes that were available from care everywhere and other healthcare systems.  Past medical history, social, surgical and  family history all reviewed in electronic medical record.  No pertanent information unless stated regarding to the chief complaint.   Review of Systems:  No headache, visual changes, nausea, vomiting, diarrhea, constipation, dizziness, abdominal pain, skin rash, fevers, chills, night sweats, weight loss, swollen lymph nodes, , chest pain, shortness of breath, mood changes. POSITIVE muscle aches, body aches, joint swelling  Objective  Blood pressure 120/80, pulse (!) 113, height 5\' 6"  (1.676 m), weight 267 lb (121.1 kg), SpO2 98 %.   General: No apparent distress alert and oriented x3 mood and affect normal, dressed appropriately.  Overweight HEENT: Pupils equal, extraocular movements intact  Respiratory: Patient's speak in full sentences and does not appear short of breath  Cardiovascular: Trace lower extremity edema, non tender, no erythema  Neuro: Cranial nerves II through XII are intact, neurovascularly intact in all extremities with 2+ DTRs and 2+ pulses.  Gait antalgic Knee: Bilateral valgus deformity noted. Large thigh to calf ratio.  Tender to palpation over medial and PF joint line.  ROM full in flexion and extension and lower leg rotation. instability with valgus force.  painful patellar compression. Patellar glide with moderate crepitus. Patellar and quadriceps tendons unremarkable. Hamstring and quadriceps strength is normal.   Right shoulder exam  shows the patient still has a positive crossover but also has positive Neer and Hawkins.  Swelling noted over the acromioclavicular joint  After informed written and verbal consent, patient was seated on exam table. Right knee was prepped with alcohol swab and utilizing anterolateral approach, patient's right knee space was injected with 4:1  marcaine 0.5%: Kenalog 40mg /dL. Patient tolerated the procedure well without immediate complications.  After informed written and verbal consent, patient was seated on exam table. Left knee was prepped with alcohol swab and utilizing anterolateral approach, patient's left knee space was injected with 4:1  marcaine 0.5%: Kenalog 40mg /dL. Patient tolerated the procedure well without immediate complications.  After informed written and verbal consent, patient was seated on exam table. Right shoulder was prepped with alcohol swab and utilizing posterior approach, patient's right glenohumeral space was injected with 4:1  marcaine 0.5%: Kenalog 40mg /dL. Patient tolerated the procedure well without immediate complications.   Impression and Recommendations:     The above documentation has been reviewed and is accurate and complete [name] Pulley, DO       Note: This dictation was prepared with Dragon dictation along with smaller phrase technology. Any transcriptional errors that result from this process are unintentional.

## 2019-11-03 NOTE — Patient Instructions (Signed)
Injected both knees and shoulder today See me again in

## 2019-11-04 ENCOUNTER — Encounter: Payer: Self-pay | Admitting: Family Medicine

## 2019-11-04 NOTE — Assessment & Plan Note (Signed)
Attempted injection.  I do believe that most of the pain though still coming from the acromioclavicular joint and does have severe amount of arthritis.  May need surgical intervention.  Discussed with patient at great length about this.  Patient is to increase activity slowly and we will continue to monitor.  Follow-up with me again in 4 to 8 weeks otherwise.

## 2019-11-04 NOTE — Assessment & Plan Note (Signed)
Chronic problem with exacerbation.  Bilateral injections given today.  Due to patient's other comorbidities including the blood thinner unable to do anti-inflammatories.  Discussed icing regimen, bracing, patient wants to avoid any surgical intervention.  Follow-up again in 4 to 6 weeks

## 2019-11-17 DIAGNOSIS — G4733 Obstructive sleep apnea (adult) (pediatric): Secondary | ICD-10-CM | POA: Diagnosis not present

## 2019-12-13 ENCOUNTER — Telehealth: Payer: Self-pay | Admitting: Internal Medicine

## 2019-12-13 NOTE — Telephone Encounter (Signed)
New message:   Pt is calling and would like a call back about some test results. Please advise.

## 2019-12-13 NOTE — Telephone Encounter (Signed)
LVM for pt to call back as soon as possible.    I do not have any recent results for tests.

## 2019-12-19 ENCOUNTER — Other Ambulatory Visit: Payer: Self-pay | Admitting: Podiatry

## 2019-12-21 ENCOUNTER — Ambulatory Visit: Payer: BC Managed Care – PPO | Admitting: Pulmonary Disease

## 2019-12-22 ENCOUNTER — Ambulatory Visit (INDEPENDENT_AMBULATORY_CARE_PROVIDER_SITE_OTHER): Payer: BC Managed Care – PPO | Admitting: Family Medicine

## 2019-12-22 ENCOUNTER — Encounter: Payer: Self-pay | Admitting: Family Medicine

## 2019-12-22 ENCOUNTER — Ambulatory Visit (INDEPENDENT_AMBULATORY_CARE_PROVIDER_SITE_OTHER): Payer: BC Managed Care – PPO | Admitting: Internal Medicine

## 2019-12-22 ENCOUNTER — Other Ambulatory Visit: Payer: Self-pay

## 2019-12-22 ENCOUNTER — Encounter: Payer: Self-pay | Admitting: Internal Medicine

## 2019-12-22 ENCOUNTER — Ambulatory Visit (INDEPENDENT_AMBULATORY_CARE_PROVIDER_SITE_OTHER): Payer: BC Managed Care – PPO

## 2019-12-22 VITALS — BP 140/92 | HR 88 | Temp 97.9°F | Resp 16 | Ht 66.0 in | Wt 265.0 lb

## 2019-12-22 VITALS — BP 140/90 | HR 76 | Ht 66.0 in | Wt 265.0 lb

## 2019-12-22 DIAGNOSIS — M545 Low back pain, unspecified: Secondary | ICD-10-CM

## 2019-12-22 DIAGNOSIS — T502X5A Adverse effect of carbonic-anhydrase inhibitors, benzothiadiazides and other diuretics, initial encounter: Secondary | ICD-10-CM

## 2019-12-22 DIAGNOSIS — Z0000 Encounter for general adult medical examination without abnormal findings: Secondary | ICD-10-CM

## 2019-12-22 DIAGNOSIS — M170 Bilateral primary osteoarthritis of knee: Secondary | ICD-10-CM

## 2019-12-22 DIAGNOSIS — E559 Vitamin D deficiency, unspecified: Secondary | ICD-10-CM | POA: Diagnosis not present

## 2019-12-22 DIAGNOSIS — D6851 Activated protein C resistance: Secondary | ICD-10-CM | POA: Diagnosis not present

## 2019-12-22 DIAGNOSIS — M19011 Primary osteoarthritis, right shoulder: Secondary | ICD-10-CM

## 2019-12-22 DIAGNOSIS — E876 Hypokalemia: Secondary | ICD-10-CM | POA: Diagnosis not present

## 2019-12-22 DIAGNOSIS — I10 Essential (primary) hypertension: Secondary | ICD-10-CM | POA: Diagnosis not present

## 2019-12-22 DIAGNOSIS — M7551 Bursitis of right shoulder: Secondary | ICD-10-CM

## 2019-12-22 DIAGNOSIS — R739 Hyperglycemia, unspecified: Secondary | ICD-10-CM | POA: Diagnosis not present

## 2019-12-22 DIAGNOSIS — K279 Peptic ulcer, site unspecified, unspecified as acute or chronic, without hemorrhage or perforation: Secondary | ICD-10-CM

## 2019-12-22 DIAGNOSIS — G8929 Other chronic pain: Secondary | ICD-10-CM

## 2019-12-22 DIAGNOSIS — E782 Mixed hyperlipidemia: Secondary | ICD-10-CM

## 2019-12-22 DIAGNOSIS — K2100 Gastro-esophageal reflux disease with esophagitis, without bleeding: Secondary | ICD-10-CM

## 2019-12-22 DIAGNOSIS — E785 Hyperlipidemia, unspecified: Secondary | ICD-10-CM

## 2019-12-22 MED ORDER — TIZANIDINE HCL 4 MG PO TABS
4.0000 mg | ORAL_TABLET | Freq: Every day | ORAL | 1 refills | Status: DC
Start: 2019-12-22 — End: 2020-01-17

## 2019-12-22 MED ORDER — NEBIVOLOL HCL 5 MG PO TABS: 5.0000 mg | ORAL_TABLET | Freq: Every day | ORAL | 0 refills | Status: DC

## 2019-12-22 MED ORDER — RIVAROXABAN 10 MG PO TABS: 10.0000 mg | ORAL_TABLET | Freq: Every day | ORAL | 1 refills | Status: DC

## 2019-12-22 MED ORDER — PANTOPRAZOLE SODIUM 40 MG PO TBEC: 40.0000 mg | DELAYED_RELEASE_TABLET | Freq: Every day | ORAL | 1 refills | Status: DC

## 2019-12-22 NOTE — Patient Instructions (Addendum)
Good to see you Back xray today Zanaflex sent in  Exercise 3 times a week   See me again in 8 weeks for the knees call me sooner if back not better

## 2019-12-22 NOTE — Progress Notes (Signed)
Subjective:  Patient ID: [name], male    DOB: 1965-09-12  Age: 54 y.o. MRN: [id number]  CC: Hypertension and Annual Exam  This visit occurred during the SARS-CoV-2 public health emergency.  Safety protocols were in place, including screening questions prior to the visit, additional usage of staff PPE, and extensive cleaning of exam room while observing appropriate contact time as indicated for disinfecting solutions.    HPI [name] presents for a CPX.  He has a history of GERD and peptic ulcer disease.  He recently saw a podiatrist who prescribed meloxicam and he tells me he is also taking ibuprofen.  He is not currently taking a PPI.  He has mild dysphagia and odynophagia.  He denies abdominal pain, nausea, vomiting, loss of appetite, weight loss, melena, or bright red blood per rectum.  He continues to feel weak and drowsy during the day.  He wants to continue taking armodafinil.  He is active and denies any recent episodes of chest pain, shortness of breath, palpitations, edema, or fatigue.  He is not currently taking the DOAC.  Outpatient Medications Prior to Visit  Medication Sig Dispense Refill  . albuterol (VENTOLIN HFA) 108 (90 Base) MCG/ACT inhaler INHALE 2 PUFFS INTO THE LUNGS EVERY 6 HOURS AS NEEDED FOR WHEEZING OR SHORTNESS OF BREATH 25.5 g 1  . Armodafinil 250 MG tablet Take 1 tablet (250 mg total) by mouth daily. 90 tablet 1  . Cholecalciferol 50 MCG (2000 UT) TABS Take 1 tablet (2,000 Units total) by mouth daily. 90 tablet 1  . fluticasone (FLONASE) 50 MCG/ACT nasal spray Place 1 spray into both nostrils daily.    . indapamide (LOZOL) 1.25 MG tablet Take 1 tablet (1.25 mg total) by mouth daily. 90 tablet 1  . KLOR-CON M20 20 MEQ tablet TAKE 1 TABLET BY MOUTH TWICE A DAY 180 tablet 0  . Multiple Vitamin (MULTIVITAMIN WITH MINERALS) TABS tablet Take 1 tablet by mouth daily.    . sildenafil (REVATIO) 20 MG tablet Take 3 tablets (60 mg total) by mouth daily as  needed. 60 tablet 5  . tadalafil (CIALIS) 20 MG tablet Take 1 tablet (20 mg total) by mouth daily as needed for erectile dysfunction. 6 tablet 5  . Vitamin D, Ergocalciferol, (DRISDOL) 1.25 MG (50000 UNIT) CAPS capsule TAKE 1 CAPSULE (50,000 UNITS TOTAL) BY MOUTH EVERY 7 (SEVEN) DAYS. 12 capsule 0  . meloxicam (MOBIC) 15 MG tablet TAKE 1 TABLET EVERY DAY 30 tablet 2  . pantoprazole (PROTONIX) 40 MG tablet Take 1 tablet (40 mg total) by mouth daily. 90 tablet 1  . rivaroxaban (XARELTO) 10 MG TABS tablet Take 1 tablet (10 mg total) by mouth daily. 90 tablet 1   No facility-administered medications prior to visit.    ROS Review of Systems  Constitutional: Negative for appetite change, diaphoresis, fatigue and unexpected weight change.  HENT: Positive for trouble swallowing. Negative for sore throat and voice change.   Eyes: Negative.   Respiratory: Negative for cough, chest tightness, shortness of breath and wheezing.   Cardiovascular: Negative for chest pain, palpitations and leg swelling.  Gastrointestinal: Negative for abdominal pain, blood in stool, constipation, diarrhea, nausea and vomiting.  Endocrine: Negative.   Genitourinary: Negative.  Negative for difficulty urinating, scrotal swelling and testicular pain.  Musculoskeletal: Positive for arthralgias. Negative for myalgias and neck pain.  Skin: Negative.   Neurological: Negative.  Negative for dizziness, weakness, light-headedness and numbness.  Hematological: Negative for adenopathy. Does not bruise/bleed easily.  Psychiatric/Behavioral: Negative.     Objective:  BP (!) 140/92   Pulse 88   Temp 97.9 F (36.6 C) (Oral)   Resp 16   Ht 5\' 6"  (1.676 m)   Wt (!) 265 lb (120.2 kg)   SpO2 95%   BMI 42.77 kg/m   BP Readings from Last 3 Encounters:  12/22/19 (!) 140/90  12/22/19 (!) 140/92  11/03/19 120/80    Wt Readings from Last 3 Encounters:  12/22/19 (!) 265 lb (120.2 kg)  12/22/19 (!) 265 lb (120.2 kg)  11/03/19  267 lb (121.1 kg)    Physical Exam Vitals reviewed.  Constitutional:      Appearance: Normal appearance. He is not ill-appearing.  HENT:     Nose: Nose normal.     Mouth/Throat:     Mouth: Mucous membranes are moist.     Pharynx: No posterior oropharyngeal erythema.  Eyes:     General: No scleral icterus.    Conjunctiva/sclera: Conjunctivae normal.  Cardiovascular:     Rate and Rhythm: Normal rate and regular rhythm.     Heart sounds: No murmur heard.   Pulmonary:     Effort: Pulmonary effort is normal.     Breath sounds: No stridor. No wheezing, rhonchi or rales.  Abdominal:     General: Abdomen is flat. Bowel sounds are normal. There is no distension.     Palpations: Abdomen is soft. There is no hepatomegaly, splenomegaly or mass.     Tenderness: There is no abdominal tenderness.     Hernia: There is no hernia in the left inguinal area or right inguinal area.  Genitourinary:    Pubic Area: No rash.      Penis: Normal.      Testes: Normal.     Epididymis:     Right: Normal.     Left: Normal.     Prostate: Normal. Not enlarged, not tender and no nodules present.     Rectum: Normal. Guaiac result negative. No mass, tenderness, anal fissure, external hemorrhoid or internal hemorrhoid. Normal anal tone.  Musculoskeletal:        General: Normal range of motion.     Cervical back: Neck supple.     Right lower leg: No edema.     Left lower leg: No edema.  Lymphadenopathy:     Cervical: No cervical adenopathy.     Lower Body: No right inguinal adenopathy. No left inguinal adenopathy.  Skin:    General: Skin is warm and dry.     Coloration: Skin is not pale.  Neurological:     General: No focal deficit present.     Mental Status: He is alert and oriented to person, place, and time. Mental status is at baseline.  Psychiatric:        Mood and Affect: Mood normal.        Behavior: Behavior normal.     Lab Results  Component Value Date   WBC 10.4 12/22/2019   HGB 14.4  12/22/2019   HCT 43.1 12/22/2019   PLT 272 12/22/2019   GLUCOSE 77 12/22/2019   CHOL 176 12/22/2019   TRIG 91 12/22/2019   HDL 47 12/22/2019   LDLCALC 110 (H) 12/22/2019   ALT 29 12/22/2019   AST 22 12/22/2019   NA 136 12/22/2019   K 4.3 12/22/2019   CL 104 12/22/2019   CREATININE 0.69 (L) 12/22/2019   BUN 14 12/22/2019   CO2 24 12/22/2019   TSH 1.25 12/22/2019   PSA  0.3 12/22/2019   INR CANCELED 08/12/2013   HGBA1C 5.7 (H) 12/22/2019    No results found.  Assessment & Plan:   Nikolis was seen today for hypertension and annual exam.  Diagnoses and all orders for this visit:  Routine general medical examination at a health care facility- Exam completed, labs reviewed, vaccines reviewed and updated, cancer screenings are up-to-date, patient education material was given. -     Lipid panel; Future -     PSA; Future -     PSA -     Lipid panel  Heterozygous factor V Leiden mutation ([place])- He has a history of factor V Leiden and has had a couple of DVTs.  I recommended that he be compliant with the anticoagulation. -     rivaroxaban (XARELTO) 10 MG TABS tablet; Take 1 tablet (10 mg total) by mouth daily. -     D-dimer, quantitative (not at [facility]); Future -     D-dimer, quantitative (not at [facility])  Essential hypertension- His blood pressure is not adequately well controlled.  Labs are negative for secondary causes or endorgan damage.  I recommended that he add nebivolol to his current regimen. -     CBC with Differential/Platelet; Future -     TSH; Future -     Urinalysis, Routine w reflex microscopic; Future -     Hepatic function panel; Future -     nebivolol (BYSTOLIC) 5 MG tablet; Take 1 tablet (5 mg total) by mouth daily. -     Hepatic function panel -     Urinalysis, Routine w reflex microscopic -     TSH -     CBC with Differential/Platelet  Diuretic-induced hypokalemia- His potassium level is normal now. -     BASIC METABOLIC PANEL WITH GFR; Future -     BASIC  METABOLIC PANEL WITH GFR  Vitamin D deficiency disease- His vitamin D level is normal now. -     VITAMIN D 25 Hydroxy (Vit-D Deficiency, Fractures); Future -     VITAMIN D 25 Hydroxy (Vit-D Deficiency, Fractures)  Hyperglycemia- His blood sugars are normal now. -     Hemoglobin A1c; Future -     Hemoglobin A1c  PUD (peptic ulcer disease)- He has some concerning upper GI symptoms and it sounds like he is taking 2 NSAIDs and not a PPI.  I recommended that he stop taking the NSAIDs and to restart the PPI.  I have also asked him to see GI to consider upper endoscopy. -     Ambulatory referral to Gastroenterology  Gastroesophageal reflux disease with esophagitis without hemorrhage- See above. -     pantoprazole (PROTONIX) 40 MG tablet; Take 1 tablet (40 mg total) by mouth daily. -     Ambulatory referral to Gastroenterology  GERD with esophagitis  Mixed hyperlipidemia  Dyslipidemia, goal LDL below 100- His ASCVD risk score is elevated at 20%.  I recommended that he take a statin for CV risk reduction. -     rosuvastatin (CRESTOR) 10 MG tablet; Take 1 tablet (10 mg total) by mouth daily.  Other orders -     SPECIMEN COMPROMISED   I have discontinued Kodee Ravert. Ginyard's meloxicam. I am also having him start on nebivolol and rosuvastatin. Additionally, I am having him maintain his multivitamin with minerals, fluticasone, sildenafil, tadalafil, Armodafinil, indapamide, Cholecalciferol, albuterol, Vitamin D (Ergocalciferol), Klor-Con M20, rivaroxaban, and pantoprazole.  Meds ordered this encounter  Medications  . rivaroxaban (XARELTO) 10 MG TABS tablet  Sig: Take 1 tablet (10 mg total) by mouth daily.    Dispense:  90 tablet    Refill:  1  . pantoprazole (PROTONIX) 40 MG tablet    Sig: Take 1 tablet (40 mg total) by mouth daily.    Dispense:  90 tablet    Refill:  1  . nebivolol (BYSTOLIC) 5 MG tablet    Sig: Take 1 tablet (5 mg total) by mouth daily.    Dispense:  84 tablet     Refill:  0  . rosuvastatin (CRESTOR) 10 MG tablet    Sig: Take 1 tablet (10 mg total) by mouth daily.    Dispense:  90 tablet    Refill:  1   In addition to time spent on CPE, I spent 50 minutes in preparing to see the patient by review of recent labs, imaging and procedures, obtaining and reviewing separately obtained history, communicating with the patient and family or caregiver, ordering medications, tests or procedures, and documenting clinical information in the EHR including the differential Dx, treatment, and any further evaluation and other management of  1. Heterozygous factor V Leiden mutation ([place]) 2. Essential hypertension 3. Diuretic-induced hypokalemia 4. Vitamin D deficiency disease 5. Hyperglycemia 6. PUD (peptic ulcer disease) 7. Gastroesophageal reflux disease with esophagitis without hemorrhage 8. GERD with esophagitis 9. Dyslipidemia, goal LDL below 100     Follow-up: Return in about 3 months (around 03/23/2020).  Scarlette Calico, MD

## 2019-12-22 NOTE — Progress Notes (Signed)
[facility] [address] [place] Phone: [phone] Subjective:   I [name] am serving as a Education administrator for Dr. Hulan Saas.  This visit occurred during the SARS-CoV-2 public health emergency.  Safety protocols were in place, including screening questions prior to the visit, additional usage of staff PPE, and extensive cleaning of exam room while observing appropriate contact time as indicated for disinfecting solutions.   I'm seeing this patient by the request  of:  [name] Lima, MD  CC: Bilateral knee pain, right shoulder pain follow-up  NGE:[id number]  [name] is a 54 y.o. male coming in with complaint of bilateral knee pain. States the shoulder is perfect. Knees are painful today.  Known arthritic changes of the knees.  States that it seems to be significantly worse recently.      Patient was last seen November 30, 2019 and had injections in bilateral knees as well as right shoulder.  MRI right shoulder shows severe AC joint arthritis and a very tiny focal partial-thickness articular surface tear of the distal supraspinatus type II acromion, patient states that after the injection 100% better.  New onset of low back pain.  Has had history of renal stones but states that it is different.  Had more pain when pushing a large cart at work.  Since then has more stiffness.  Denies radicular symptoms down the legs.  Past Medical History:  Diagnosis Date  . Anxiety   . Arthritis    knees - no med  . Cluster headache    Hx - resolved per patient  . Depression   . DVT of lower extremity (deep venous thrombosis) ([place]) 6/26-28/2013   Xarelto - resolved - history  . HTN (hypertension)    currently no meds  . Irritability and anger    PMH of  . Kidney stone    passed stone, no surgery required.  . Rotator cuff arthropathy 2007   right  . [place]addle pulmonary embolus ([place]) 6/26-28/2013   post orthopedic surgery - resolved  .  Sleep apnea 5/15   moderate OSA-has cpap but does not use it   Past Surgical History:  Procedure Laterality Date  . arthroscopic knee surgery  10/09/2011   Dr Ninfa Linden  . CARDIAC CATHETERIZATION  2007   LVdysfunction; normal coronaries  . KNEE ARTHROSCOPY WITH MEDIAL MENISECTOMY Left 09/20/2014   medial menisectomy chondroplastytella medial plica excision  ;  Surgeon: Dorna Leitz, MD;  Location: [facility];  Service: Orthopedics;  Laterality: Left;  . ROTATOR CUFF REPAIR  2007   right  . UPPER GASTROINTESTINAL ENDOSCOPY  2007   normal  . WISDOM TOOTH EXTRACTION     Social History   Socioeconomic History  . Marital status: Married    Spouse name: Not on file  . Number of children: 6  . Years of education: 5  . Highest education level: Not on file  Occupational History  . Occupation: Glass blower/designer   . Occupation: Programmer, systems  Tobacco Use  . Smoking status: Former Smoker    Types: Cigars  . Smokeless tobacco: Never Used  . Tobacco comment: every 3-4 months  Vaping Use  . Vaping Use: Never used  Substance and Sexual Activity  . Alcohol use: Yes    Alcohol/week: 0.0 standard drinks    Comment: drinks occasionally  . Drug use: No  . Sexual activity: Yes    Birth control/protection: Condom  Other Topics Concern  .  Not on file  Social History Narrative   Fun: Exercise   Social Determinants of Health   Financial Resource Strain:   . Difficulty of Paying Living Expenses:   Food Insecurity:   . Worried About Charity fundraiser in the Last Year:   . Arboriculturist in the Last Year:   Transportation Needs:   . Film/video editor (Medical):   [name] Lack of Transportation (Non-Medical):   Physical Activity:   . Days of Exercise per Week:   . Minutes of Exercise per Session:   Stress:   . Feeling of Stress :   Social Connections:   . Frequency of Communication with Friends and Family:   . Frequency of Social Gatherings with Friends and Family:     . Attends Religious Services:   . Active Member of Clubs or Organizations:   . Attends Archivist Meetings:   [name] Marital Status:    Allergies  Allergen Reactions  . Tramadol Other (See Comments)    insomnia   Family History  Problem Relation Age of Onset  . Cancer Mother        Mesothelioma   . Diabetes Brother   . Hypertension Brother   . Stroke Brother 83  . Heart attack Brother 68  . Diabetes Maternal Aunt   . Diabetes Maternal Grandmother   . Heart disease Maternal Grandmother   . Hypertension Maternal Grandmother   . Stroke Maternal Grandmother        in 87s  . Deep vein thrombosis Maternal Grandmother   . Stroke Maternal Grandfather        > 55  . Migraines Brother   . Heart attack Brother 37  . Healthy Father   . Colon cancer Neg Hx   . Esophageal cancer Neg Hx   . Rectal cancer Neg Hx   . Stomach cancer Neg Hx   . Liver cancer Neg Hx   . Pancreatic cancer Neg Hx      Current Outpatient Medications (Cardiovascular):  .  indapamide (LOZOL) 1.25 MG tablet, Take 1 tablet (1.25 mg total) by mouth daily. .  nebivolol (BYSTOLIC) 5 MG tablet, Take 1 tablet (5 mg total) by mouth daily. .  sildenafil (REVATIO) 20 MG tablet, Take 3 tablets (60 mg total) by mouth daily as needed. .  tadalafil (CIALIS) 20 MG tablet, Take 1 tablet (20 mg total) by mouth daily as needed for erectile dysfunction.  Current Outpatient Medications (Respiratory):  .  albuterol (VENTOLIN HFA) 108 (90 Base) MCG/ACT inhaler, INHALE 2 PUFFS INTO THE LUNGS EVERY 6 HOURS AS NEEDED FOR WHEEZING OR SHORTNESS OF BREATH .  fluticasone (FLONASE) 50 MCG/ACT nasal spray, Place 1 spray into both nostrils daily.   Current Outpatient Medications (Hematological):  .  rivaroxaban (XARELTO) 10 MG TABS tablet, Take 1 tablet (10 mg total) by mouth daily.  Current Outpatient Medications (Other):  [name]  Armodafinil 250 MG tablet, Take 1 tablet (250 mg total) by mouth daily. .  Cholecalciferol 50 MCG (2000  UT) TABS, Take 1 tablet (2,000 Units total) by mouth daily. [name]  KLOR-CON M20 20 MEQ tablet, TAKE 1 TABLET BY MOUTH TWICE A DAY .  Multiple Vitamin (MULTIVITAMIN WITH MINERALS) TABS tablet, Take 1 tablet by mouth daily. .  pantoprazole (PROTONIX) 40 MG tablet, Take 1 tablet (40 mg total) by mouth daily. .  Vitamin D, Ergocalciferol, (DRISDOL) 1.25 MG (50000 UNIT) CAPS capsule, TAKE 1 CAPSULE (50,000 UNITS TOTAL) BY MOUTH EVERY 7 (  SEVEN) DAYS. [name]  tiZANidine (ZANAFLEX) 4 MG tablet, Take 1 tablet (4 mg total) by mouth at bedtime.   Reviewed prior external information including notes and imaging from  primary care provider As well as notes that were available from care everywhere and other healthcare systems.  Past medical history, social, surgical and family history all reviewed in electronic medical record.  No pertanent information unless stated regarding to the chief complaint.   Review of Systems:  No headache, visual changes, nausea, vomiting, diarrhea, constipation, dizziness, abdominal pain, skin rash, fevers, chills, night sweats, weight loss, swollen lymph nodes, body aches, joint swelling, chest pain, shortness of breath, mood changes. POSITIVE muscle aches  Objective  Blood pressure (!) 140/90, pulse 76, height 5\' 6"  (1.676 m), weight (!) 265 lb (120.2 kg), SpO2 93 %.   General: No apparent distress alert and oriented x3 mood and affect normal, dressed appropriately.  HEENT: Pupils equal, extraocular movements intact  Respiratory: Patient's speak in full sentences and does not appear short of breath  Cardiovascular: No lower extremity edema, non tender, no erythema  Neuro: Cranial nerves II through XII are intact, neurovascularly intact in all extremities with 2+ DTRs and 2+ pulses.  Gait mild antalgic MSK:   Shoulder exam shows the patient does have very mild impingement but full range of motion and 5 out of 5 strength  Low back exam shows the patient does have some tightness  noted.  Very tightness of the hamstrings as well.  Lacks last 10 degrees of extension.  Tightness with FABER test but no radicular symptoms.  Neurovascularly intact distally with 5 out of 5 strength   Knee: Bilateral valgus deformity noted. Large thigh to calf ratio.  Tender to palpation over medial and PF joint line.  ROM full in flexion and extension and lower leg rotation. instability with valgus force.  painful patellar compression. Patellar glide with moderate crepitus. Patellar and quadriceps tendons unremarkable. Hamstring and quadriceps strength is normal. Contralateral knee shows  After informed written and verbal consent, patient was seated on exam table. Right knee was prepped with alcohol swab and utilizing anterolateral approach, patient's right knee space was injected with 4:1  marcaine 0.5%: Kenalog 40mg /dL. Patient tolerated the procedure well without immediate complications.  After informed written and verbal consent, patient was seated on exam table. Left knee was prepped with alcohol swab and utilizing anterolateral approach, patient's left knee space was injected with 4:1  marcaine 0.5%: Kenalog 40mg /dL. Patient tolerated the procedure well without immediate complications.    Impression and Recommendations:     The above documentation has been reviewed and is accurate and complete Lyndal Pulley, DO       Note: This dictation was prepared with Dragon dictation along with smaller phrase technology. Any transcriptional errors that result from this process are unintentional.

## 2019-12-22 NOTE — Patient Instructions (Signed)

## 2019-12-23 ENCOUNTER — Encounter: Payer: Self-pay | Admitting: Family Medicine

## 2019-12-23 DIAGNOSIS — M545 Low back pain, unspecified: Secondary | ICD-10-CM | POA: Insufficient documentation

## 2019-12-23 LAB — BASIC METABOLIC PANEL WITH GFR
BUN/Creatinine Ratio: 20 (calc) (ref 6–22)
BUN: 14 mg/dL (ref 7–25)
CO2: 24 mmol/L (ref 20–32)
Calcium: 9.2 mg/dL (ref 8.6–10.3)
Chloride: 104 mmol/L (ref 98–110)
Creat: 0.69 mg/dL — ABNORMAL LOW (ref 0.70–1.33)
GFR, Est African American: 125 mL/min/{1.73_m2} (ref >=60)
GFR, Est Non African American: 108 mL/min/{1.73_m2} (ref >=60)
Glucose, Bld: 77 mg/dL (ref 65–99)
Potassium: 4.3 mmol/L (ref 3.5–5.3)
Sodium: 136 mmol/L (ref 135–146)

## 2019-12-23 LAB — URINALYSIS, ROUTINE W REFLEX MICROSCOPIC
Bilirubin Urine: NEGATIVE
Glucose, UA: NEGATIVE
Hgb urine dipstick: NEGATIVE
Ketones, ur: NEGATIVE
Leukocytes,Ua: NEGATIVE
Nitrite: NEGATIVE
Protein, ur: NEGATIVE
Specific Gravity, Urine: 1.017 (ref 1.001–1.03)
pH: <=5 (ref 5.0–8.0)

## 2019-12-23 LAB — CBC WITH DIFFERENTIAL/PLATELET
Absolute Monocytes: 832 cells/uL (ref 200–950)
Basophils Absolute: 62 cells/uL (ref 0–200)
Basophils Relative: 0.6 %
Eosinophils Absolute: 281 cells/uL (ref 15–500)
Eosinophils Relative: 2.7 %
HCT: 43.1 % (ref 38.5–50.0)
Hemoglobin: 14.4 g/dL (ref 13.2–17.1)
Lymphs Abs: 2579 cells/uL (ref 850–3900)
MCH: 29.8 pg (ref 27.0–33.0)
MCHC: 33.4 g/dL (ref 32.0–36.0)
MCV: 89 fL (ref 80.0–100.0)
MPV: 9.9 fL (ref 7.5–12.5)
Monocytes Relative: 8 %
Neutro Abs: 6646 cells/uL (ref 1500–7800)
Neutrophils Relative %: 63.9 %
Platelets: 272 10*3/uL (ref 140–400)
RBC: 4.84 10*6/uL (ref 4.20–5.80)
RDW: 12.6 % (ref 11.0–15.0)
Total Lymphocyte: 24.8 %
WBC: 10.4 10*3/uL (ref 3.8–10.8)

## 2019-12-23 LAB — HEPATIC FUNCTION PANEL
AG Ratio: 1.4 (calc) (ref 1.0–2.5)
ALT: 29 U/L (ref 9–46)
AST: 22 U/L (ref 10–35)
Albumin: 4.2 g/dL (ref 3.6–5.1)
Alkaline phosphatase (APISO): 52 U/L (ref 35–144)
Bilirubin, Direct: 0.1 mg/dL (ref 0.0–0.2)
Globulin: 3 g/dL (calc) (ref 1.9–3.7)
Indirect Bilirubin: 0.2 mg/dL (calc) (ref 0.2–1.2)
Total Bilirubin: 0.3 mg/dL (ref 0.2–1.2)
Total Protein: 7.2 g/dL (ref 6.1–8.1)

## 2019-12-23 LAB — HEMOGLOBIN A1C
Hgb A1c MFr Bld: 5.7 % of total Hgb — ABNORMAL HIGH (ref <5.7)
Mean Plasma Glucose: 117 (calc)
eAG (mmol/L): 6.5 (calc)

## 2019-12-23 LAB — LIPID PANEL
Cholesterol: 176 mg/dL (ref <200)
HDL: 47 mg/dL (ref >=40)
LDL Cholesterol (Calc): 110 mg/dL (calc) — ABNORMAL HIGH
Non-HDL Cholesterol (Calc): 129 mg/dL (calc) (ref <130)
Total CHOL/HDL Ratio: 3.7 (calc) (ref <5.0)
Triglycerides: 91 mg/dL (ref <150)

## 2019-12-23 LAB — PSA: PSA: 0.3 ng/mL (ref <=4.0)

## 2019-12-23 LAB — SPECIMEN COMPROMISED

## 2019-12-23 LAB — D-DIMER, QUANTITATIVE: D-Dimer, Quant: 0.88 mcg/mL FEU — ABNORMAL HIGH (ref <0.50)

## 2019-12-23 LAB — VITAMIN D 25 HYDROXY (VIT D DEFICIENCY, FRACTURES): Vit D, 25-Hydroxy: 54 ng/mL (ref 30–100)

## 2019-12-23 LAB — TSH: TSH: 1.25 mIU/L (ref 0.40–4.50)

## 2019-12-23 MED ORDER — ROSUVASTATIN CALCIUM 10 MG PO TABS: 10.0000 mg | ORAL_TABLET | Freq: Every day | ORAL | 1 refills | Status: DC

## 2019-12-23 NOTE — Assessment & Plan Note (Signed)
Same thing significant improvement after injection.  Continue conservative therapy

## 2019-12-23 NOTE — Assessment & Plan Note (Signed)
Bilateral injections given again today.  Discussed the possibility of viscosupplementation.  May not be potentially helpful.  Patient wants to avoid any surgical intervention.  Discussed icing regimen and home exercises.  Follow-up again in8 weeks, chronic problem with exacerbation.  Due to patient's other comorbidities unable to take oral anti-inflammatories regularly.

## 2019-12-23 NOTE — Assessment & Plan Note (Signed)
Significant improvement after injection.  No significant changes otherwise

## 2019-12-23 NOTE — Assessment & Plan Note (Signed)
Seems to be more secondary to muscle spasm.  Zanaflex given today.  Warned of potential side effects but I think will be beneficial.  Unable to do anti-inflammatories secondary to patient being on a blood thinner.  Discussed home exercises, core strengthening.  X-rays pending.  Follow-up again in 4 to 8 weeks.  Worsening pain or potential radicular symptoms consider physical therapy or advanced imaging

## 2019-12-26 ENCOUNTER — Other Ambulatory Visit: Payer: Self-pay

## 2019-12-26 ENCOUNTER — Ambulatory Visit (INDEPENDENT_AMBULATORY_CARE_PROVIDER_SITE_OTHER): Payer: BC Managed Care – PPO | Admitting: Adult Health

## 2019-12-26 ENCOUNTER — Encounter: Payer: Self-pay | Admitting: Adult Health

## 2019-12-26 VITALS — BP 138/88 | HR 74 | Ht 67.0 in | Wt 268.0 lb

## 2019-12-26 DIAGNOSIS — G4733 Obstructive sleep apnea (adult) (pediatric): Secondary | ICD-10-CM

## 2019-12-26 DIAGNOSIS — D6851 Activated protein C resistance: Secondary | ICD-10-CM

## 2019-12-26 NOTE — Patient Instructions (Addendum)
Continue on CPAP at bedtime Mask fitting / Can ask about Dream wear nasal mask  Work on healthy weight Do not drive if sleepy CPAP download requested Continue on Xarelto 10 mg daily Report any signs of bleeding Avoid nonsteroidals-/NSAIDs Follow-up with Dr. Elsworth Soho in 6 months and as needed

## 2019-12-26 NOTE — Assessment & Plan Note (Signed)
Healthy weight loss encouraged 

## 2019-12-26 NOTE — Progress Notes (Signed)
@Patient  ID: [name], male    DOB: 12/08/1965, 54 y.o.   MRN: [id number]  Chief Complaint  Patient presents with  . Follow-up    OSA     Referring provider: [name], MD  HPI: 54 year old male former smoker followed for moderate obstructive sleep apnea maintained on CPAP with nasal pillows History of chronic cough attributed to GERD History of saddle PE in 2013, recurrent DVTs, factor V Leiden mutation-recommended for lifelong anticoagulation with Xarelto 10 mg daily   TEST/EVENTS :  HST 09/2013: 24/hr PFTs 01/2017 normal 03/2018 CT angiogram normal appearance of lungs Factor V Leiden mutation -SINGLE R506Q MUTATION IDENTIFIED (HETEROZYGOTE)  12/26/2019 Follow up : OSA  Patient presents for a 32-month follow-up.  Patient has known underlying obstructive sleep apnea.  Is on nocturnal CPAP. Says he has been having trouble with mask, having trouble wearing his mask , leaks a lot . Has full face mask. We talked about trying new mask to help with sealing issues due to facial hair. Feels tired during daytime.  CPAP download showed usage last in April 2021  Patient has a history of saddle PE in 2013 and previous history of recurrent DVTs with known factor V Leiden mutation.  He has been recommended for lifelong anticoagulation with Xarelto 10 mg daily.  Patient has had some compliance issues with Xarelto in the past.  He is encouraged on medication compliance to help prevent recurrent DVTs.  Has had some history of some recurrent cough felt secondary to GERD.  Says overall cough is doing well. No current cough.  He remains on Protonix daily.  Covid vaccine moderna utd .    Allergies  Allergen Reactions  . Tramadol Other (See Comments)    insomnia    Immunization History  Administered Date(s) Administered  . Hepatitis A, Adult 08/28/2017, 01/20/2018  . Influenza Whole 03/02/2012  . Influenza, High Dose Seasonal PF 06/02/2017, 03/22/2019  . Influenza,inj,Quad  PF,6+ Mos 02/23/2019  . Influenza-Unspecified 03/10/2013  . Tdap 06/18/2011    Past Medical History:  Diagnosis Date  . Anxiety   . Arthritis    knees - no med  . Cluster headache    Hx - resolved per patient  . Depression   . DVT of lower extremity (deep venous thrombosis) ([place]) 6/26-28/2013   Xarelto - resolved - history  . HTN (hypertension)    currently no meds  . Irritability and anger    PMH of  . Kidney stone    passed stone, no surgery required.  . Rotator cuff arthropathy 2007   right  . Saddle pulmonary embolus ([place]) 6/26-28/2013   post orthopedic surgery - resolved  . Sleep apnea 5/15   moderate OSA-has cpap but does not use it    Tobacco History: Social History   Tobacco Use  Smoking Status Former Smoker  . Types: Cigars  Smokeless Tobacco Never Used  Tobacco Comment   every 3-4 months   Counseling given: Not Answered Comment: every 3-4 months   Outpatient Medications Prior to Visit  Medication Sig Dispense Refill  . albuterol (VENTOLIN HFA) 108 (90 Base) MCG/ACT inhaler INHALE 2 PUFFS INTO THE LUNGS EVERY 6 HOURS AS NEEDED FOR WHEEZING OR SHORTNESS OF BREATH 25.5 g 1  . Armodafinil 250 MG tablet Take 1 tablet (250 mg total) by mouth daily. 90 tablet 1  . Cholecalciferol 50 MCG (2000 UT) TABS Take 1 tablet (2,000 Units total) by mouth daily. 90 tablet 1  . fluticasone (FLONASE)  50 MCG/ACT nasal spray Place 1 spray into both nostrils daily.    . indapamide (LOZOL) 1.25 MG tablet Take 1 tablet (1.25 mg total) by mouth daily. 90 tablet 1  . KLOR-CON M20 20 MEQ tablet TAKE 1 TABLET BY MOUTH TWICE A DAY 180 tablet 0  . Multiple Vitamin (MULTIVITAMIN WITH MINERALS) TABS tablet Take 1 tablet by mouth daily.    . nebivolol (BYSTOLIC) 5 MG tablet Take 1 tablet (5 mg total) by mouth daily. 84 tablet 0  . pantoprazole (PROTONIX) 40 MG tablet Take 1 tablet (40 mg total) by mouth daily. 90 tablet 1  . rivaroxaban (XARELTO) 10 MG TABS tablet Take 1 tablet (10 mg  total) by mouth daily. 90 tablet 1  . rosuvastatin (CRESTOR) 10 MG tablet Take 1 tablet (10 mg total) by mouth daily. 90 tablet 1  . sildenafil (REVATIO) 20 MG tablet Take 3 tablets (60 mg total) by mouth daily as needed. 60 tablet 5  . tadalafil (CIALIS) 20 MG tablet Take 1 tablet (20 mg total) by mouth daily as needed for erectile dysfunction. 6 tablet 5  . tiZANidine (ZANAFLEX) 4 MG tablet Take 1 tablet (4 mg total) by mouth at bedtime. 30 tablet 1  . Vitamin D, Ergocalciferol, (DRISDOL) 1.25 MG (50000 UNIT) CAPS capsule TAKE 1 CAPSULE (50,000 UNITS TOTAL) BY MOUTH EVERY 7 (SEVEN) DAYS. 12 capsule 0   No facility-administered medications prior to visit.     Review of Systems:   Constitutional:   No  weight loss, night sweats,  Fevers, chills,  +{fatigue, or  lassitude.  HEENT:   No headaches,  Difficulty swallowing,  Tooth/dental problems, or  Sore throat,                No sneezing, itching, ear ache, nasal congestion, post nasal drip,   CV:  No chest pain,  Orthopnea, PND, swelling in lower extremities, anasarca, dizziness, palpitations, syncope.   GI  No heartburn, indigestion, abdominal pain, nausea, vomiting, diarrhea, change in bowel habits, loss of appetite, bloody stools.   Resp:  .  No chest wall deformity  Skin: no rash or lesions.  GU: no dysuria, change in color of urine, no urgency or frequency.  No flank pain, no hematuria   MS:  No joint pain or swelling.  No decreased range of motion.  No back pain.    Physical Exam  BP (!) 138/88 (BP Location: Left Arm, Cuff Size: Normal)   Pulse 74   Ht 5\' 7"  (1.702 m)   Wt (!) 268 lb (121.6 kg)   SpO2 98%   BMI 41.97 kg/m   GEN: A/Ox3; pleasant , NAD, well nourished    HEENT:  [place]/AT,   , NOSE-clear, THROAT-clear, no lesions, no postnasal drip or exudate noted. Facial hair , Class 3 MP airway   NECK:  Supple w/ fair ROM; no JVD; normal carotid impulses w/o bruits; no thyromegaly or nodules palpated; no  lymphadenopathy.    RESP  Clear  P & A; w/o, wheezes/ rales/ or rhonchi. no accessory muscle use, no dullness to percussion  CARD:  RRR, no m/r/g, no peripheral edema, pulses intact, no cyanosis or clubbing.  GI:   Soft & nt; nml bowel sounds; no organomegaly or masses detected.   Musco: Warm bil, no deformities or joint swelling noted.   Neuro: alert, no focal deficits noted.    Skin: Warm, no lesions or rashes    Lab Results:  CBC  BNP  PFT Results Latest Ref Rng & Units 01/20/2017  FVC-Pre L 4.14  FVC-Predicted Pre % 110  FVC-Post L 4.10  FVC-Predicted Post % 109  Pre FEV1/FVC % % 90  Post FEV1/FCV % % 91  FEV1-Pre L 3.74  FEV1-Predicted Pre % 125  FEV1-Post L 3.72  DLCO UNC% % 92  DLCO COR %Predicted % 103  TLC L 5.37  TLC % Predicted % 84  RV % Predicted % 67    No results found for: NITRICOXIDE      Assessment & Plan:   OSA (obstructive sleep apnea) Moderate obstructive sleep apnea encouraged on improved compliance.  Patient needs to try a new mask.  Have recommended that he consider a DreamWear nasal mask.  Have asked him to discuss with homecare company for a mask fitting. Patient education on sleep apnea and potential complications of untreated sleep apnea  Plan  Patient Instructions  Continue on CPAP at bedtime Mask fitting / Can ask about Dream wear nasal mask  Work on healthy weight Do not drive if sleepy CPAP download requested Continue on Xarelto 10 mg daily Report any signs of bleeding Avoid nonsteroidals-/NSAIDs Follow-up with Dr. Elsworth Soho in 6 months and as needed     Morbid obesity ([place]) Healthy weight loss encouraged  Heterozygous factor V Leiden mutation ([place]) History of PE and previous DVTs.  Known factor V Leiden mutation.  Patient is encouraged on compliance with daily Xarelto at 10 mg daily patient education given.     Rexene Edison, NP 12/26/2019

## 2019-12-26 NOTE — Assessment & Plan Note (Signed)
History of PE and previous DVTs.  Known factor V Leiden mutation.  Patient is encouraged on compliance with daily Xarelto at 10 mg daily patient education given.

## 2019-12-26 NOTE — Assessment & Plan Note (Signed)
Moderate obstructive sleep apnea encouraged on improved compliance.  Patient needs to try a new mask.  Have recommended that he consider a DreamWear nasal mask.  Have asked him to discuss with homecare company for a mask fitting. Patient education on sleep apnea and potential complications of untreated sleep apnea  Plan  Patient Instructions  Continue on CPAP at bedtime Mask fitting / Can ask about Dream wear nasal mask  Work on healthy weight Do not drive if sleepy CPAP download requested Continue on Xarelto 10 mg daily Report any signs of bleeding Avoid nonsteroidals-/NSAIDs Follow-up with Dr. Elsworth Soho in 6 months and as needed

## 2019-12-27 ENCOUNTER — Telehealth: Payer: Self-pay | Admitting: Pulmonary Disease

## 2019-12-28 NOTE — Telephone Encounter (Signed)
Pt called back & left vm that he wants mask fitting changed to 4:00 or later - gets off work at 3:30.  I called sleep lab & spoke to Kia.  She states pt has called there & left vm as well.  She is going to call him to reschedule.  Nothing further needed.

## 2019-12-28 NOTE — Telephone Encounter (Signed)
Called pt & left vm for call back.

## 2020-01-04 ENCOUNTER — Other Ambulatory Visit (HOSPITAL_BASED_OUTPATIENT_CLINIC_OR_DEPARTMENT_OTHER): Payer: BC Managed Care – PPO | Admitting: Pulmonary Disease

## 2020-01-10 ENCOUNTER — Other Ambulatory Visit: Payer: Self-pay

## 2020-01-10 ENCOUNTER — Ambulatory Visit (HOSPITAL_BASED_OUTPATIENT_CLINIC_OR_DEPARTMENT_OTHER): Payer: BC Managed Care – PPO | Attending: Adult Health | Admitting: Pulmonary Disease

## 2020-01-10 DIAGNOSIS — G4733 Obstructive sleep apnea (adult) (pediatric): Secondary | ICD-10-CM

## 2020-01-17 ENCOUNTER — Other Ambulatory Visit: Payer: Self-pay | Admitting: Family Medicine

## 2020-01-23 DIAGNOSIS — G4733 Obstructive sleep apnea (adult) (pediatric): Secondary | ICD-10-CM | POA: Diagnosis not present

## 2020-01-25 ENCOUNTER — Other Ambulatory Visit: Payer: BC Managed Care – PPO

## 2020-01-25 ENCOUNTER — Encounter: Payer: Self-pay | Admitting: Gastroenterology

## 2020-01-25 ENCOUNTER — Ambulatory Visit (INDEPENDENT_AMBULATORY_CARE_PROVIDER_SITE_OTHER): Payer: BC Managed Care – PPO | Admitting: Gastroenterology

## 2020-01-25 VITALS — BP 158/90 | HR 79 | Ht 67.0 in | Wt 266.2 lb

## 2020-01-25 DIAGNOSIS — A048 Other specified bacterial intestinal infections: Secondary | ICD-10-CM | POA: Diagnosis not present

## 2020-01-25 MED ORDER — METRONIDAZOLE 250 MG PO TABS
250.0000 mg | ORAL_TABLET | Freq: Two times a day (BID) | ORAL | 0 refills | Status: AC
Start: 2020-01-25 — End: 2020-02-08

## 2020-01-25 MED ORDER — DOXYCYCLINE HYCLATE 100 MG PO CAPS: 100.0000 mg | ORAL_CAPSULE | Freq: Two times a day (BID) | ORAL | 0 refills | Status: AC

## 2020-01-25 MED ORDER — OMEPRAZOLE 40 MG PO CPDR
40.0000 mg | DELAYED_RELEASE_CAPSULE | Freq: Two times a day (BID) | ORAL | 0 refills | Status: DC
Start: 2020-01-25 — End: 2021-01-22

## 2020-01-25 NOTE — Progress Notes (Signed)
01/25/2020 [name] [id number] 05-Oct-1965   HISTORY OF PRESENT ILLNESS: This is a 54 year old male is a patient of Dr. Ardis Hughs.  He is here today with complaints of intermittent upper abdominal burning sensation/indigestion.  He had an EGD in May 2019 that showed the following:  - Several small ulcers in the stomach, biopsied to check for H. pylori, neoplams. - Non-specific duodenitis.  Biopsies showed H. pylori gastritis.  It appears that we attempted to contact him on a few occasions to treat him with Pylera, but we are unable to get in touch with him.  He has pantoprazole 40 mg daily on his medication list, but tells me that he is not taking that.  He says that the symptoms that he experiences feel the same that he had prior to undergoing the endoscopy.  Past Medical History:  Diagnosis Date  . Anxiety   . Arthritis    knees - no med  . Cluster headache    Hx - resolved per patient  . Depression   . DVT of lower extremity (deep venous thrombosis) ([place]) 6/26-28/2013   Xarelto - resolved - history  . HTN (hypertension)    currently no meds  . Irritability and anger    PMH of  . Kidney stone    passed stone, no surgery required.  . Rotator cuff arthropathy 2007   right  . Saddle pulmonary embolus ([place]) 6/26-28/2013   post orthopedic surgery - resolved  . Sleep apnea 5/15   moderate OSA-has cpap but does not use it   Past Surgical History:  Procedure Laterality Date  . arthroscopic knee surgery  10/09/2011   Dr Ninfa Linden  . CARDIAC CATHETERIZATION  2007   LVdysfunction; normal coronaries  . KNEE ARTHROSCOPY WITH MEDIAL MENISECTOMY Left 09/20/2014   medial menisectomy chondroplastytella medial plica excision  ;  Surgeon: Dorna Leitz, MD;  Location: [facility];  Service: Orthopedics;  Laterality: Left;  . ROTATOR CUFF REPAIR  2007   right  . UPPER GASTROINTESTINAL ENDOSCOPY  2007   normal  . WISDOM TOOTH EXTRACTION      reports that he has  quit smoking. His smoking use included cigars. He has never used smokeless tobacco. He reports current alcohol use. He reports that he does not use drugs. family history includes Cancer in his mother; Deep vein thrombosis in his maternal grandmother; Diabetes in his brother, maternal aunt, and maternal grandmother; Healthy in his father; Heart attack (age of onset: 73) in his brother; Heart attack (age of onset: 86) in his brother; Heart disease in his maternal grandmother; Hypertension in his brother and maternal grandmother; Migraines in his brother; Stroke in his maternal grandfather and maternal grandmother; Stroke (age of onset: 13) in his brother. Allergies  Allergen Reactions  . Tramadol Other (See Comments)    insomnia      Outpatient Encounter Medications as of 01/25/2020  Medication Sig  . albuterol (VENTOLIN HFA) 108 (90 Base) MCG/ACT inhaler INHALE 2 PUFFS INTO THE LUNGS EVERY 6 HOURS AS NEEDED FOR WHEEZING OR SHORTNESS OF BREATH  . Armodafinil 250 MG tablet Take 1 tablet (250 mg total) by mouth daily.  . Cholecalciferol 50 MCG (2000 UT) TABS Take 1 tablet (2,000 Units total) by mouth daily.  . fluticasone (FLONASE) 50 MCG/ACT nasal spray Place 1 spray into both nostrils daily.  . indapamide (LOZOL) 1.25 MG tablet Take 1 tablet (1.25 mg total) by mouth daily.  [name] KLOR-CON M20 20 MEQ tablet TAKE  1 TABLET BY MOUTH TWICE A DAY  . Multiple Vitamin (MULTIVITAMIN WITH MINERALS) TABS tablet Take 1 tablet by mouth daily.  . nebivolol (BYSTOLIC) 5 MG tablet Take 1 tablet (5 mg total) by mouth daily.  . pantoprazole (PROTONIX) 40 MG tablet Take 1 tablet (40 mg total) by mouth daily.  . rivaroxaban (XARELTO) 10 MG TABS tablet Take 1 tablet (10 mg total) by mouth daily.  . rosuvastatin (CRESTOR) 10 MG tablet Take 1 tablet (10 mg total) by mouth daily.  . sildenafil (REVATIO) 20 MG tablet Take 3 tablets (60 mg total) by mouth daily as needed.  . tadalafil (CIALIS) 20 MG tablet Take 1 tablet (20  mg total) by mouth daily as needed for erectile dysfunction.  [name] tiZANidine (ZANAFLEX) 4 MG tablet TAKE 1 TABLET (4 MG TOTAL) BY MOUTH AT BEDTIME.  [name] Vitamin D, Ergocalciferol, (DRISDOL) 1.25 MG (50000 UNIT) CAPS capsule TAKE 1 CAPSULE (50,000 UNITS TOTAL) BY MOUTH EVERY 7 (SEVEN) DAYS.  [name] doxycycline (VIBRAMYCIN) 100 MG capsule Take 1 capsule (100 mg total) by mouth 2 (two) times daily for 14 days.  . metroNIDAZOLE (FLAGYL) 250 MG tablet Take 1 tablet (250 mg total) by mouth 2 (two) times daily for 14 days.  [name] omeprazole (PRILOSEC) 40 MG capsule Take 1 capsule (40 mg total) by mouth 2 (two) times daily for 14 days.  . [DISCONTINUED] albuterol (VENTOLIN HFA) 108 (90 Base) MCG/ACT inhaler Inhale 2 puffs into the lungs every 6 (six) hours as needed for wheezing or shortness of breath.  . [DISCONTINUED] potassium chloride SA (KLOR-CON) 20 MEQ tablet Take 1 tablet (20 mEq total) by mouth 2 (two) times daily.   No facility-administered encounter medications on file as of 01/25/2020.     REVIEW OF SYSTEMS  : All other systems reviewed and negative except where noted in the History of Present Illness.   PHYSICAL EXAM: BP (!) 158/90   Pulse 79   Ht 5\' 7"  (1.702 m)   Wt 266 lb 3.2 oz (120.7 kg)   BMI 41.69 kg/m  General: Well developed AA male in no acute distress Head: Normocephalic and atraumatic Eyes:  Sclerae anicteric, conjunctiva pink. Ears: Normal auditory acuity Lungs: Clear throughout to auscultation; no W/R/R. Heart: Regular rate and rhythm; no M/R/G. Abdomen: Soft, non-distended.  BS present.  Non-tender. Musculoskeletal: Symmetrical with no gross deformities  Skin: No lesions on visible extremities Extremities: No edema  Neurological: Alert oriented x 4, grossly non-focal Psychological:  Alert and cooperative. Normal mood and affect  ASSESSMENT AND PLAN: *H. pylori gastritis: This was found on biopsies from his EGD in May 2019.  Patient was unable to be reached and therefore was  never treated.  Complains of intermittent upper abdominal discomfort and indigestion type sensation.  Will treat with Pylera x14 days.  Needs twice daily PPI with this as well.  We will plan for stool for H. pylori antigen 8 weeks posttreatment to confirm eradication.   CC:  [name], MD

## 2020-01-25 NOTE — Patient Instructions (Addendum)
If you are age 54 or older, your body mass index should be between 23-30. Your Body mass index is 41.69 kg/m. If this is out of the aforementioned range listed, please consider follow up with your Primary Care Provider.  If you are age 65 or younger, your body mass index should be between 19-25. Your Body mass index is 41.69 kg/m. If this is out of the aformentioned range listed, please consider follow up with your Primary Care Provider.    Breakdown is as follows (all x 14 days, then discontinue):  Breakfast:  *Bismuth subsalicylate chewable tablet (Pepto Bismol) 262 mg--    2 tablets  *Doxycycline 100 mg-1 tablet  *Metronidazole (Flagyl) 250 mg tablet-1 tablet  *Omeprazole (Prilosec) 40 mg-1 tablet  Lunch:  *Bismuth subsalicylate chewable tablet (Pepto Bismol) 262 mg--    2 tablets   Dinner: *Bismuth subsalicylate chewable tablet (Pepto Bismol) 262 mg--    2 tablets  *Doxycycline 100 mg- 1 tablet  *Metronidazole (Flagyl) 250 mg tablet-1 tablet  *Omeprazole (Prilosec) 40 mg-1 tablet  Bedtime: *Bismuth subsalicylate chewable tablet (Pepto Bismol) 262 mg--   2 tablets  Need to repeat H. Pylori stool studies no sooner than 04/06/20.

## 2020-01-26 NOTE — Progress Notes (Signed)
I agree with the above note, plan 

## 2020-01-30 ENCOUNTER — Other Ambulatory Visit: Payer: Self-pay | Admitting: Internal Medicine

## 2020-01-30 ENCOUNTER — Encounter: Payer: Self-pay | Admitting: Family Medicine

## 2020-01-30 ENCOUNTER — Encounter: Payer: Self-pay | Admitting: Internal Medicine

## 2020-01-30 NOTE — Telephone Encounter (Signed)
Spoke with patient. Scheduled for later this week. Continued back pain.

## 2020-02-01 NOTE — Progress Notes (Signed)
[place] [address] [name] Phone: [phone] Subjective:   I [name] am serving as a Education administrator for Dr. Hulan Saas.  This visit occurred during the SARS-CoV-2 public health emergency.  Safety protocols were in place, including screening questions prior to the visit, additional usage of staff PPE, and extensive cleaning of exam room while observing appropriate contact time as indicated for disinfecting solutions.   I'm seeing this patient by the request  of:  [name], MD  CC: Low back pain  [id number]   12/22/2019 Seems to be more secondary to muscle spasm.  Zanaflex given today.  Warned of potential side effects but I think will be beneficial.  Unable to do anti-inflammatories secondary to patient being on a blood thinner.  Discussed home exercises, core strengthening.  X-rays pending.  Follow-up again in 4 to 8 weeks.  Worsening pain or potential radicular symptoms consider physical therapy or advanced imaging  Same thing significant improvement after injection.  Continue conservative therapy  Significant improvement after injection.  No significant changes otherwise  Update 02/02/2020 GIANLUCA CHHIM is a 54 y.o. male coming in with complaint of lower back and right shoulder pain. Patient states that he has been seeing a chiropractor and back pain. Is being released by chiropractor but would to discuss next steps in our treatment for him. Pain with lumbar flexion in right/left side. Unable to sleep throughout the night. Zanaflex is not helping patient sleep.  Has been out of work. 4 weeks.   Xray 12/22/2019 Grade 1 anterolisthesis of L4/L5  Past Medical History:  Diagnosis Date  . Anxiety   . Arthritis    knees - no med  . Cluster headache    Hx - resolved per patient  . Depression   . DVT of lower extremity (deep venous thrombosis) ([place]) 6/26-28/2013   Xarelto - resolved - history  . HTN (hypertension)     currently no meds  . Irritability and anger    PMH of  . Kidney stone    passed stone, no surgery required.  . Rotator cuff arthropathy 2007   right  . Saddle pulmonary embolus ([place]) 6/26-28/2013   post orthopedic surgery - resolved  . Sleep apnea 5/15   moderate OSA-has cpap but does not use it   Past Surgical History:  Procedure Laterality Date  . arthroscopic knee surgery  10/09/2011   Dr Ninfa Linden  . CARDIAC CATHETERIZATION  2007   LVdysfunction; normal coronaries  . KNEE ARTHROSCOPY WITH MEDIAL MENISECTOMY Left 09/20/2014   medial menisectomy chondroplastytella medial plica excision  ;  Surgeon: Dorna Leitz, MD;  Location: [facility];  Service: Orthopedics;  Laterality: Left;  . ROTATOR CUFF REPAIR  2007   right  . UPPER GASTROINTESTINAL ENDOSCOPY  2007   normal  . WISDOM TOOTH EXTRACTION     Social History   Socioeconomic History  . Marital status: Married    Spouse name: Not on file  . Number of children: 6  . Years of education: 39  . Highest education level: Not on file  Occupational History  . Occupation: Glass blower/designer   . Occupation: Programmer, systems  Tobacco Use  . Smoking status: Former Smoker    Types: Cigars  . Smokeless tobacco: Never Used  . Tobacco comment: every 3-4 months  Vaping Use  . Vaping Use: Never used  Substance and Sexual Activity  . Alcohol use: Yes    Alcohol/week:  0.0 standard drinks    Comment: drinks occasionally  . Drug use: No  . Sexual activity: Yes    Birth control/protection: Condom  Other Topics Concern  . Not on file  Social History Narrative   Fun: Exercise   Social Determinants of Health   Financial Resource Strain:   . Difficulty of Paying Living Expenses: Not on file  Food Insecurity:   . Worried About Charity fundraiser in the Last Year: Not on file  . Ran Out of Food in the Last Year: Not on file  Transportation Needs:   . Lack of Transportation (Medical): Not on file  . Lack of  Transportation (Non-Medical): Not on file  Physical Activity:   . Days of Exercise per Week: Not on file  . Minutes of Exercise per Session: Not on file  Stress:   . Feeling of Stress : Not on file  Social Connections:   . Frequency of Communication with Friends and Family: Not on file  . Frequency of Social Gatherings with Friends and Family: Not on file  . Attends Religious Services: Not on file  . Active Member of Clubs or Organizations: Not on file  . Attends Archivist Meetings: Not on file  . Marital Status: Not on file   Allergies  Allergen Reactions  . Tramadol Other (See Comments)    insomnia   Family History  Problem Relation Age of Onset  . Cancer Mother        Mesothelioma   . Diabetes Brother   . Hypertension Brother   . Stroke Brother 38  . Heart attack Brother 31  . Diabetes Maternal Aunt   . Diabetes Maternal Grandmother   . Heart disease Maternal Grandmother   . Hypertension Maternal Grandmother   . Stroke Maternal Grandmother        in 15s  . Deep vein thrombosis Maternal Grandmother   . Stroke Maternal Grandfather        > 55  . Migraines Brother   . Heart attack Brother 65  . Healthy Father   . Colon cancer Neg Hx   . Esophageal cancer Neg Hx   . Rectal cancer Neg Hx   . Stomach cancer Neg Hx   . Liver cancer Neg Hx   . Pancreatic cancer Neg Hx      Current Outpatient Medications (Cardiovascular):  .  indapamide (LOZOL) 1.25 MG tablet, Take 1 tablet (1.25 mg total) by mouth daily. .  nebivolol (BYSTOLIC) 5 MG tablet, Take 1 tablet (5 mg total) by mouth daily. .  rosuvastatin (CRESTOR) 10 MG tablet, Take 1 tablet (10 mg total) by mouth daily. .  sildenafil (REVATIO) 20 MG tablet, Take 3 tablets (60 mg total) by mouth daily as needed. .  tadalafil (CIALIS) 20 MG tablet, Take 1 tablet (20 mg total) by mouth daily as needed for erectile dysfunction.  Current Outpatient Medications (Respiratory):  .  albuterol (VENTOLIN HFA) 108 (90  Base) MCG/ACT inhaler, INHALE 2 PUFFS INTO THE LUNGS EVERY 6 HOURS AS NEEDED FOR WHEEZING OR SHORTNESS OF BREATH .  fluticasone (FLONASE) 50 MCG/ACT nasal spray, Place 1 spray into both nostrils daily.   Current Outpatient Medications (Hematological):  .  rivaroxaban (XARELTO) 10 MG TABS tablet, Take 1 tablet (10 mg total) by mouth daily.  Current Outpatient Medications (Other):  [name]  Armodafinil 250 MG tablet, Take 1 tablet (250 mg total) by mouth daily. .  Cholecalciferol 50 MCG (2000 UT) TABS, Take 1 tablet (  2,000 Units total) by mouth daily. [name]  doxycycline (VIBRAMYCIN) 100 MG capsule, Take 1 capsule (100 mg total) by mouth 2 (two) times daily for 14 days. [name]  KLOR-CON M20 20 MEQ tablet, TAKE 1 TABLET BY MOUTH TWICE A DAY .  metroNIDAZOLE (FLAGYL) 250 MG tablet, Take 1 tablet (250 mg total) by mouth 2 (two) times daily for 14 days. .  Multiple Vitamin (MULTIVITAMIN WITH MINERALS) TABS tablet, Take 1 tablet by mouth daily. [name]  omeprazole (PRILOSEC) 40 MG capsule, Take 1 capsule (40 mg total) by mouth 2 (two) times daily for 14 days. .  pantoprazole (PROTONIX) 40 MG tablet, Take 1 tablet (40 mg total) by mouth daily. [name]  tiZANidine (ZANAFLEX) 4 MG tablet, TAKE 1 TABLET (4 MG TOTAL) BY MOUTH AT BEDTIME. [name]  Vitamin D, Ergocalciferol, (DRISDOL) 1.25 MG (50000 UNIT) CAPS capsule, TAKE 1 CAPSULE (50,000 UNITS TOTAL) BY MOUTH EVERY 7 (SEVEN) DAYS. [name]  methocarbamol (ROBAXIN) 500 MG tablet, Take 1 tablet (500 mg total) by mouth at bedtime as needed for muscle spasms.   Reviewed prior external information including notes and imaging from  primary care provider As well as notes that were available from care everywhere and other healthcare systems.  Past medical history, social, surgical and family history all reviewed in electronic medical record.  No pertanent information unless stated regarding to the chief complaint.   Review of Systems:  No headache, visual changes, nausea, vomiting, diarrhea,  constipation, dizziness, abdominal pain, skin rash, fevers, chills, night sweats, weight loss, swollen lymph nodes, , joint swelling, chest pain, shortness of breath, mood changes. POSITIVE muscle aches, body aches  Objective  Blood pressure 112/72, pulse 81, height 5\' 7"  (1.702 m), weight 269 lb (122 kg), SpO2 98 %.   General: No apparent distress alert and oriented x3 mood and affect normal, dressed appropriately.  HEENT: Pupils equal, extraocular movements intact  Respiratory: Patient's speak in full sentences and does not appear short of breath  Cardiovascular: No lower extremity edema, non tender, no erythema  Neuro: Cranial nerves II through XII are intact, neurovascularly intact in all extremities with 2+ DTRs and 2+ pulses.  Gait antalgic   MSK:  Low back exam shows patient does have some mild loss of lordosis.  Tenderness to palpation in the paraspinal musculature.  Mild radicular symptoms with straight leg test of forward flexion and 20 degrees    Impression and Recommendations:     The above documentation has been reviewed and is accurate and complete Lyndal Pulley, DO       Note: This dictation was prepared with Dragon dictation along with smaller phrase technology. Any transcriptional errors that result from this process are unintentional.

## 2020-02-02 ENCOUNTER — Other Ambulatory Visit: Payer: Self-pay

## 2020-02-02 ENCOUNTER — Encounter: Payer: Self-pay | Admitting: Family Medicine

## 2020-02-02 ENCOUNTER — Ambulatory Visit (INDEPENDENT_AMBULATORY_CARE_PROVIDER_SITE_OTHER): Payer: BC Managed Care – PPO | Admitting: Family Medicine

## 2020-02-02 VITALS — BP 112/72 | HR 81 | Ht 67.0 in | Wt 269.0 lb

## 2020-02-02 DIAGNOSIS — M545 Low back pain, unspecified: Secondary | ICD-10-CM

## 2020-02-02 DIAGNOSIS — G8929 Other chronic pain: Secondary | ICD-10-CM | POA: Diagnosis not present

## 2020-02-02 MED ORDER — METHOCARBAMOL 500 MG PO TABS: 500.0000 mg | ORAL_TABLET | Freq: Every evening | ORAL | 1 refills | Status: DC | PRN

## 2020-02-02 NOTE — Assessment & Plan Note (Signed)
Patient does have an L5-S1 spondylolisthesis.  Patient has been working with a Restaurant manager, fast food as well as with physical therapy and made some improvement but unfortunately continues to have radicular symptoms that is still affecting daily activities as well as patient's job performance.  At this point we discussed with patient that I do feel that we could potentially to increase his activity a little bit at work with 1 shift a week for the first week, 2 shifts the following week etc.  Patient know I do feel the MRI would be beneficial and could be a candidate for possible epidural, patient is on a blood thinner and we need to hold and have approval from primary care provider.  We will see what imaging shows for this chronic problem with mild exacerbation.  Change muscle relaxer to methocarbamol.

## 2020-02-02 NOTE — Telephone Encounter (Signed)
Pt has made appt for 02/08/20.[name] KitchenJohny [name]

## 2020-02-02 NOTE — Patient Instructions (Addendum)
MRI lumbar With the mild slipping in your back I feel advanced imaging is appropriate Robaxin 500 mg at night See if chiropractor can continue note I suggest starting with 1 shift first week, and increasing weekly We'll contact you after MRI Stop the zanaflex if doing Robaxin

## 2020-02-03 ENCOUNTER — Other Ambulatory Visit: Payer: Self-pay | Admitting: Internal Medicine

## 2020-02-03 DIAGNOSIS — N529 Male erectile dysfunction, unspecified: Secondary | ICD-10-CM

## 2020-02-05 ENCOUNTER — Ambulatory Visit
Admission: RE | Admit: 2020-02-05 | Discharge: 2020-02-05 | Disposition: A | Discharge status: Home / Self Care | Payer: BC Managed Care – PPO | Source: Ambulatory Visit | Attending: Family Medicine | Admitting: Family Medicine

## 2020-02-05 ENCOUNTER — Other Ambulatory Visit: Payer: Self-pay

## 2020-02-05 DIAGNOSIS — M48061 Spinal stenosis, lumbar region without neurogenic claudication: Secondary | ICD-10-CM | POA: Diagnosis not present

## 2020-02-05 DIAGNOSIS — M545 Low back pain, unspecified: Secondary | ICD-10-CM

## 2020-02-05 DIAGNOSIS — G8929 Other chronic pain: Secondary | ICD-10-CM

## 2020-02-08 ENCOUNTER — Ambulatory Visit (INDEPENDENT_AMBULATORY_CARE_PROVIDER_SITE_OTHER): Payer: BC Managed Care – PPO | Admitting: Internal Medicine

## 2020-02-08 ENCOUNTER — Other Ambulatory Visit: Payer: Self-pay

## 2020-02-08 ENCOUNTER — Encounter: Payer: Self-pay | Admitting: Internal Medicine

## 2020-02-08 VITALS — BP 136/88 | HR 80 | Temp 97.9°F | Resp 16 | Ht 67.0 in | Wt 270.0 lb

## 2020-02-08 DIAGNOSIS — E291 Testicular hypofunction: Secondary | ICD-10-CM | POA: Diagnosis not present

## 2020-02-08 DIAGNOSIS — L2084 Intrinsic (allergic) eczema: Secondary | ICD-10-CM | POA: Diagnosis not present

## 2020-02-08 DIAGNOSIS — G4710 Hypersomnia, unspecified: Secondary | ICD-10-CM

## 2020-02-08 DIAGNOSIS — N5201 Erectile dysfunction due to arterial insufficiency: Secondary | ICD-10-CM

## 2020-02-08 MED ORDER — FLUOCINONIDE 0.05 % EX CREA: 1.0000 "application " | TOPICAL_CREAM | Freq: Two times a day (BID) | CUTANEOUS | 2 refills | Status: DC

## 2020-02-08 MED ORDER — ARMODAFINIL 250 MG PO TABS: 250.0000 mg | ORAL_TABLET | Freq: Every day | ORAL | 1 refills | Status: DC

## 2020-02-08 NOTE — Progress Notes (Signed)
Subjective:  Patient ID: [name], male    DOB: 1965/10/07  Age: 54 y.o. MRN: [id number]  CC: Rash  This visit occurred during the SARS-CoV-2 public health emergency.  Safety protocols were in place, including screening questions prior to the visit, additional usage of staff PPE, and extensive cleaning of exam room while observing appropriate contact time as indicated for disinfecting solutions.    HPI [name] presents for f/up - He complains of a several week hx of mildly pruritis rash on the volar side of his right forearm.  He also complains of low libido and erectile dysfunction.  He wants to know if he has a low testosterone level.  He was not willing to get a flu vaccine today.  Outpatient Medications Prior to Visit  Medication Sig Dispense Refill  . albuterol (VENTOLIN HFA) 108 (90 Base) MCG/ACT inhaler INHALE 2 PUFFS INTO THE LUNGS EVERY 6 HOURS AS NEEDED FOR WHEEZING OR SHORTNESS OF BREATH 25.5 g 1  . Cholecalciferol 50 MCG (2000 UT) TABS Take 1 tablet (2,000 Units total) by mouth daily. 90 tablet 1  . doxycycline (VIBRAMYCIN) 100 MG capsule Take 1 capsule (100 mg total) by mouth 2 (two) times daily for 14 days. 28 capsule 0  . fluticasone (FLONASE) 50 MCG/ACT nasal spray Place 1 spray into both nostrils daily.    . indapamide (LOZOL) 1.25 MG tablet Take 1 tablet (1.25 mg total) by mouth daily. 90 tablet 1  . KLOR-CON M20 20 MEQ tablet TAKE 1 TABLET BY MOUTH TWICE A DAY 180 tablet 0  . methocarbamol (ROBAXIN) 500 MG tablet Take 1 tablet (500 mg total) by mouth at bedtime as needed for muscle spasms. 30 tablet 1  . metroNIDAZOLE (FLAGYL) 250 MG tablet Take 1 tablet (250 mg total) by mouth 2 (two) times daily for 14 days. 28 tablet 0  . Multiple Vitamin (MULTIVITAMIN WITH MINERALS) TABS tablet Take 1 tablet by mouth daily.    . nebivolol (BYSTOLIC) 5 MG tablet Take 1 tablet (5 mg total) by mouth daily. 84 tablet 0  . omeprazole (PRILOSEC) 40 MG capsule Take 1  capsule (40 mg total) by mouth 2 (two) times daily for 14 days. 28 capsule 0  . pantoprazole (PROTONIX) 40 MG tablet Take 1 tablet (40 mg total) by mouth daily. 90 tablet 1  . rivaroxaban (XARELTO) 10 MG TABS tablet Take 1 tablet (10 mg total) by mouth daily. 90 tablet 1  . rosuvastatin (CRESTOR) 10 MG tablet Take 1 tablet (10 mg total) by mouth daily. 90 tablet 1  . sildenafil (REVATIO) 20 MG tablet Take 3 tablets (60 mg total) by mouth daily as needed. 60 tablet 5  . tadalafil (CIALIS) 20 MG tablet Take 1 tablet (20 mg total) by mouth daily as needed for erectile dysfunction. 6 tablet 5  . tiZANidine (ZANAFLEX) 4 MG tablet TAKE 1 TABLET (4 MG TOTAL) BY MOUTH AT BEDTIME. 30 tablet 1  . Vitamin D, Ergocalciferol, (DRISDOL) 1.25 MG (50000 UNIT) CAPS capsule TAKE 1 CAPSULE (50,000 UNITS TOTAL) BY MOUTH EVERY 7 (SEVEN) DAYS. 12 capsule 0  . Armodafinil 250 MG tablet Take 1 tablet (250 mg total) by mouth daily. 90 tablet 1   No facility-administered medications prior to visit.    ROS Review of Systems  Constitutional: Positive for fatigue. Negative for appetite change, chills, diaphoresis and fever.  HENT: Negative.   Eyes: Negative.   Respiratory: Negative for cough, chest tightness, shortness of breath and wheezing.  Cardiovascular: Negative for chest pain, palpitations and leg swelling.  Gastrointestinal: Negative for abdominal pain, constipation, diarrhea, nausea and vomiting.  Endocrine: Negative.   Genitourinary: Negative.  Negative for difficulty urinating.       +ED and low libido  Musculoskeletal: Negative.  Negative for arthralgias and myalgias.  Skin: Positive for rash. Negative for color change.  Hematological: Negative for adenopathy. Does not bruise/bleed easily.    Objective:  BP 136/88   Pulse 80   Temp 97.9 F (36.6 C) (Oral)   Resp 16   Ht 5\' 7"  (1.702 m)   Wt 270 lb (122.5 kg)   SpO2 96%   BMI 42.29 kg/m   BP Readings from Last 3 Encounters:  02/08/20 136/88    02/02/20 112/72  01/25/20 (!) 158/90    Wt Readings from Last 3 Encounters:  02/08/20 270 lb (122.5 kg)  02/02/20 269 lb (122 kg)  01/25/20 266 lb 3.2 oz (120.7 kg)    Physical Exam Vitals reviewed.  Constitutional:      Appearance: Normal appearance.  HENT:     Nose: Nose normal.     Mouth/Throat:     Mouth: Mucous membranes are moist.  Eyes:     General: No scleral icterus.    Conjunctiva/sclera: Conjunctivae normal.  Cardiovascular:     Rate and Rhythm: Normal rate and regular rhythm.     Heart sounds: No murmur heard.   Pulmonary:     Effort: Pulmonary effort is normal.     Breath sounds: No stridor. No wheezing, rhonchi or rales.  Abdominal:     General: Abdomen is flat. Bowel sounds are normal. There is no distension.     Palpations: Abdomen is soft. There is no hepatomegaly, splenomegaly or mass.     Tenderness: There is no abdominal tenderness.  Musculoskeletal:        General: Normal range of motion.     Cervical back: Neck supple.     Right lower leg: No edema.     Left lower leg: No edema.  Lymphadenopathy:     Cervical: No cervical adenopathy.  Skin:    General: Skin is warm and dry.     Findings: Rash present. Rash is papular. Rash is not crusting, purpuric, pustular, scaling or urticarial.     Comments: Scattered diffusely and in no particular pattern on the dorsum of the right forearm around the site of a tattoo there are faint, scaly, erythematous papules.  Neurological:     General: No focal deficit present.     Mental Status: He is alert.     Lab Results  Component Value Date   WBC 10.4 12/22/2019   HGB 14.4 12/22/2019   HCT 43.1 12/22/2019   PLT 272 12/22/2019   GLUCOSE 77 12/22/2019   CHOL 176 12/22/2019   TRIG 91 12/22/2019   HDL 47 12/22/2019   LDLCALC 110 (H) 12/22/2019   ALT 29 12/22/2019   AST 22 12/22/2019   NA 136 12/22/2019   K 4.3 12/22/2019   CL 104 12/22/2019   CREATININE 0.69 (L) 12/22/2019   BUN 14 12/22/2019   CO2  24 12/22/2019   TSH 1.25 12/22/2019   PSA 0.3 12/22/2019   INR CANCELED 08/12/2013   HGBA1C 5.7 (H) 12/22/2019    MR Lumbar Spine Wo Contrast  Result Date: 02/05/2020 CLINICAL DATA:  Low back pain. EXAM: MRI LUMBAR SPINE WITHOUT CONTRAST TECHNIQUE: Multiplanar, multisequence MR imaging of the lumbar spine was performed. No intravenous contrast was  administered. COMPARISON:  Lumbar spine radiographs 12/22/2019 FINDINGS: Segmentation:  Standard. Alignment:  Normal. Vertebrae: No fracture, suspicious osseous lesion, or evidence of discitis. Mild-to-moderate bilateral facet edema at L4-5. Scattered small vertebral hemangiomas. Conus medullaris and cauda equina: Conus extends to the L1-2 level. Conus and cauda equina appear normal. Paraspinal and other soft tissues: Partially visualized 3.3 cm right renal cyst. Additional 2.0 cm right renal cyst. Disc levels: T12-L1 through L2-3: Negative. L3-4: Minimal disc bulging and mild facet and ligamentum flavum hypertrophy with small joint effusions. No stenosis. L4-5: Mild disc desiccation. Minimal disc bulging and mild left greater than right facet and ligamentum flavum hypertrophy without stenosis. L5-S1: Mild right facet hypertrophy without disc herniation or stenosis. IMPRESSION: 1. Mild lower lumbar facet arthrosis. Facet edema at L4-5 which may be a source of back pain. 2. No disc herniation or stenosis. Electronically Signed   By: Logan Bores M.D.   On: 02/05/2020 22:00    Assessment & Plan:   [name] was seen today for rash.  Diagnoses and all orders for this visit:  Erectile dysfunction due to arterial insufficiency- See below. -     Prolactin; Future -     Testosterone Total,Free,Bio, Males; Future -     Testosterone Total,Free,Bio, Males -     Prolactin  Excessive somnolence disorder- Will continue the current dose of armodafinil. -     Armodafinil 250 MG tablet; Take 1 tablet (250 mg total) by mouth daily.  Intrinsic eczema -      fluocinonide cream (LIDEX) 0.05 %; Apply 1 application topically 2 (two) times daily.  Hypogonadism male- His total testosterone is normal but his bioavailable testosterone is low.  He wants to consider treatment options so I recommended that he see endocrinology. -     Ambulatory referral to Endocrinology   I am having [name]. Garant start on fluocinonide cream. I am also having him maintain his multivitamin with minerals, fluticasone, tadalafil, indapamide, Cholecalciferol, albuterol, Vitamin D (Ergocalciferol), Klor-Con M20, rivaroxaban, pantoprazole, nebivolol, rosuvastatin, tiZANidine, omeprazole, methocarbamol, sildenafil, and Armodafinil.  Meds ordered this encounter  Medications  . Armodafinil 250 MG tablet    Sig: Take 1 tablet (250 mg total) by mouth daily.    Dispense:  90 tablet    Refill:  1  . fluocinonide cream (LIDEX) 0.05 %    Sig: Apply 1 application topically 2 (two) times daily.    Dispense:  60 g    Refill:  2     Follow-up: No follow-ups on file.  Scarlette Calico, MD

## 2020-02-09 ENCOUNTER — Encounter: Payer: Self-pay | Admitting: Family Medicine

## 2020-02-09 LAB — TESTOSTERONE TOTAL,FREE,BIO, MALES
Albumin: 4 g/dL (ref 3.6–5.1)
Sex Hormone Binding: 67 nmol/L — ABNORMAL HIGH (ref 10–50)
Testosterone, Bioavailable: 59.2 ng/dL — ABNORMAL LOW (ref >=110.0)
Testosterone, Free: 32.2 pg/mL — ABNORMAL LOW (ref 46.0–224.0)
Testosterone: 447 ng/dL (ref 250–827)

## 2020-02-09 LAB — PROLACTIN: Prolactin: 6.5 ng/mL (ref 2.0–18.0)

## 2020-02-10 ENCOUNTER — Encounter: Payer: Self-pay | Admitting: Internal Medicine

## 2020-02-10 ENCOUNTER — Other Ambulatory Visit: Payer: Self-pay | Admitting: Internal Medicine

## 2020-02-10 DIAGNOSIS — E291 Testicular hypofunction: Secondary | ICD-10-CM | POA: Insufficient documentation

## 2020-02-10 NOTE — Progress Notes (Signed)
[facility] [address] [place] Phone: [phone] Subjective:   I [name] am serving as a Education administrator for Dr. Hulan Saas.  This visit occurred during the SARS-CoV-2 public health emergency.  Safety protocols were in place, including screening questions prior to the visit, additional usage of staff PPE, and extensive cleaning of exam room while observing appropriate contact time as indicated for disinfecting solutions.   I'm seeing this patient by the request  of:  [name], MD  CC: Back pain follow-up  [id number]   02/02/2020 Patient does have an L5-S1 spondylolisthesis.  Patient has been working with a Restaurant manager, fast food as well as with physical therapy and made some improvement but unfortunately continues to have radicular symptoms that is still affecting daily activities as well as patient's job performance.  At this point we discussed with patient that I do feel that we could potentially to increase his activity a little bit at work with 1 shift a week for the first week, 2 shifts the following week etc.  Patient know I do feel the MRI would be beneficial and could be a candidate for possible epidural, patient is on a blood thinner and we need to hold and have approval from primary care provider.  We will see what imaging shows for this chronic problem with mild exacerbation.  Change muscle relaxer to methocarbamol.  Update 02/14/2020 JUDY GOODENOW is a 54 y.o. male coming in with complaint of low back pain. Would like to talk about facet injection recommendations noted in MRI results. Patient wants knee injections today patient noted does have injections about 6 weeks ago.  Wondering why he is having more leg pain recently.. Back is still painful.  Patient is is not making significant improvement.  MRI 02/05/2020 IMPRESSION: 1. Mild lower lumbar facet arthrosis. Facet edema at L4-5 which may be a source of back pain. 2. No disc  herniation or stenosis.     Past Medical History:  Diagnosis Date  . Anxiety   . Arthritis    knees - no med  . Cluster headache    Hx - resolved per patient  . Depression   . DVT of lower extremity (deep venous thrombosis) ([place]) 6/26-28/2013   Xarelto - resolved - history  . HTN (hypertension)    currently no meds  . Irritability and anger    PMH of  . Kidney stone    passed stone, no surgery required.  . Rotator cuff arthropathy 2007   right  . Saddle pulmonary embolus ([place]) 6/26-28/2013   post orthopedic surgery - resolved  . Sleep apnea 5/15   moderate OSA-has cpap but does not use it   Past Surgical History:  Procedure Laterality Date  . arthroscopic knee surgery  10/09/2011   Dr Ninfa Linden  . CARDIAC CATHETERIZATION  2007   LVdysfunction; normal coronaries  . KNEE ARTHROSCOPY WITH MEDIAL MENISECTOMY Left 09/20/2014   medial menisectomy chondroplastytella medial plica excision  ;  Surgeon: Dorna Leitz, MD;  Location: [facility];  Service: Orthopedics;  Laterality: Left;  . ROTATOR CUFF REPAIR  2007   right  . UPPER GASTROINTESTINAL ENDOSCOPY  2007   normal  . WISDOM TOOTH EXTRACTION     Social History   Socioeconomic History  . Marital status: Married    Spouse name: Not on file  . Number of children: 6  . Years of education: 47  . Highest education level: Not on file  Occupational History  . Occupation: Glass blower/designer   . Occupation: Programmer, systems  Tobacco Use  . Smoking status: Former Smoker    Types: Cigars  . Smokeless tobacco: Never Used  . Tobacco comment: every 3-4 months  Vaping Use  . Vaping Use: Never used  Substance and Sexual Activity  . Alcohol use: Yes    Alcohol/week: 0.0 standard drinks    Comment: drinks occasionally  . Drug use: No  . Sexual activity: Yes    Birth control/protection: Condom  Other Topics Concern  . Not on file  Social History Narrative   Fun: Exercise   Social Determinants of Health    Financial Resource Strain:   . Difficulty of Paying Living Expenses: Not on file  Food Insecurity:   . Worried About Charity fundraiser in the Last Year: Not on file  . Ran Out of Food in the Last Year: Not on file  Transportation Needs:   . Lack of Transportation (Medical): Not on file  . Lack of Transportation (Non-Medical): Not on file  Physical Activity:   . Days of Exercise per Week: Not on file  . Minutes of Exercise per Session: Not on file  Stress:   . Feeling of Stress : Not on file  Social Connections:   . Frequency of Communication with Friends and Family: Not on file  . Frequency of Social Gatherings with Friends and Family: Not on file  . Attends Religious Services: Not on file  . Active Member of Clubs or Organizations: Not on file  . Attends Archivist Meetings: Not on file  . Marital Status: Not on file   Allergies  Allergen Reactions  . Tramadol Other (See Comments)    insomnia   Family History  Problem Relation Age of Onset  . Cancer Mother        Mesothelioma   . Diabetes Brother   . Hypertension Brother   . Stroke Brother 70  . Heart attack Brother 63  . Diabetes Maternal Aunt   . Diabetes Maternal Grandmother   . Heart disease Maternal Grandmother   . Hypertension Maternal Grandmother   . Stroke Maternal Grandmother        in 54s  . Deep vein thrombosis Maternal Grandmother   . Stroke Maternal Grandfather        > 55  . Migraines Brother   . Heart attack Brother 50  . Healthy Father   . Colon cancer Neg Hx   . Esophageal cancer Neg Hx   . Rectal cancer Neg Hx   . Stomach cancer Neg Hx   . Liver cancer Neg Hx   . Pancreatic cancer Neg Hx      Current Outpatient Medications (Cardiovascular):  .  indapamide (LOZOL) 1.25 MG tablet, Take 1 tablet (1.25 mg total) by mouth daily. .  nebivolol (BYSTOLIC) 5 MG tablet, Take 1 tablet (5 mg total) by mouth daily. .  rosuvastatin (CRESTOR) 10 MG tablet, Take 1 tablet (10 mg total) by  mouth daily. .  sildenafil (REVATIO) 20 MG tablet, Take 3 tablets (60 mg total) by mouth daily as needed. .  tadalafil (CIALIS) 20 MG tablet, Take 1 tablet (20 mg total) by mouth daily as needed for erectile dysfunction.  Current Outpatient Medications (Respiratory):  .  albuterol (VENTOLIN HFA) 108 (90 Base) MCG/ACT inhaler, INHALE 2 PUFFS INTO THE LUNGS EVERY 6 HOURS AS NEEDED FOR WHEEZING OR SHORTNESS OF BREATH .  fluticasone (FLONASE) 50 MCG/ACT nasal  spray, Place 1 spray into both nostrils daily.   Current Outpatient Medications (Hematological):  .  rivaroxaban (XARELTO) 10 MG TABS tablet, Take 1 tablet (10 mg total) by mouth daily.  Current Outpatient Medications (Other):  [name]  Armodafinil 250 MG tablet, Take 1 tablet (250 mg total) by mouth daily. .  Cholecalciferol 50 MCG (2000 UT) TABS, Take 1 tablet (2,000 Units total) by mouth daily. .  fluocinonide cream (LIDEX) 4.85 %, Apply 1 application topically 2 (two) times daily. [name]  KLOR-CON M20 20 MEQ tablet, TAKE 1 TABLET BY MOUTH TWICE A DAY .  methocarbamol (ROBAXIN) 500 MG tablet, Take 1 tablet (500 mg total) by mouth at bedtime as needed for muscle spasms. .  Multiple Vitamin (MULTIVITAMIN WITH MINERALS) TABS tablet, Take 1 tablet by mouth daily. .  pantoprazole (PROTONIX) 40 MG tablet, Take 1 tablet (40 mg total) by mouth daily. [name]  tiZANidine (ZANAFLEX) 4 MG tablet, TAKE 1 TABLET (4 MG TOTAL) BY MOUTH AT BEDTIME. [name]  Vitamin D, Ergocalciferol, (DRISDOL) 1.25 MG (50000 UNIT) CAPS capsule, TAKE 1 CAPSULE (50,000 UNITS TOTAL) BY MOUTH EVERY 7 (SEVEN) DAYS. [name]  omeprazole (PRILOSEC) 40 MG capsule, Take 1 capsule (40 mg total) by mouth 2 (two) times daily for 14 days.   Reviewed prior external information including notes and imaging from  primary care provider As well as notes that were available from care everywhere and other healthcare systems.  Past medical history, social, surgical and family history all reviewed in electronic  medical record.  No pertanent information unless stated regarding to the chief complaint.   Review of Systems:  No headache, visual changes, nausea, vomiting, diarrhea, constipation, dizziness, abdominal pain, skin rash, fevers, chills, night sweats, weight loss, swollen lymph nodes, body aches, joint swelling, chest pain, shortness of breath, mood changes. POSITIVE muscle aches  Objective  Blood pressure 140/90, pulse 95, height 5\' 7"  (1.702 m), weight 271 lb (122.9 kg), SpO2 97 %.   General: No apparent distress alert and oriented x3 mood and affect normal, dressed appropriately.  HEENT: Pupils equal, extraocular movements intact  Respiratory: Patient's speak in full sentences and does not appear short of breath  Cardiovascular: No lower extremity edema, non tender, no erythema  Neuro: Cranial nerves II through XII are intact, neurovascularly intact in all extremities with 2+ DTRs and 2+ pulses.  Gait antalgic MSK: Back exam does have some mild loss of lordosis.  Some tenderness to palpation of the paraspinal musculature worsening with any type of extension.  Patient has some mild tightness with FABER test bilaterally.  Patient has some mild tightness with straight leg test as well but no true radicular symptoms at the moment.    Impression and Recommendations:     The above documentation has been reviewed and is accurate and complete Lyndal Pulley, DO       Note: This dictation was prepared with Dragon dictation along with smaller phrase technology. Any transcriptional errors that result from this process are unintentional.

## 2020-02-12 ENCOUNTER — Encounter: Payer: Self-pay | Admitting: Internal Medicine

## 2020-02-12 NOTE — Patient Instructions (Signed)

## 2020-02-14 ENCOUNTER — Ambulatory Visit (INDEPENDENT_AMBULATORY_CARE_PROVIDER_SITE_OTHER)
Admission: RE | Admit: 2020-02-14 | Discharge: 2020-02-14 | Disposition: A | Discharge status: Home / Self Care | Payer: BC Managed Care – PPO | Source: Ambulatory Visit | Attending: Family Medicine | Admitting: Family Medicine

## 2020-02-14 ENCOUNTER — Encounter: Payer: Self-pay | Admitting: Family Medicine

## 2020-02-14 ENCOUNTER — Other Ambulatory Visit: Payer: Self-pay

## 2020-02-14 ENCOUNTER — Other Ambulatory Visit: Payer: Self-pay | Admitting: Family Medicine

## 2020-02-14 ENCOUNTER — Ambulatory Visit (INDEPENDENT_AMBULATORY_CARE_PROVIDER_SITE_OTHER): Payer: BC Managed Care – PPO | Admitting: Family Medicine

## 2020-02-14 VITALS — BP 140/90 | HR 95 | Ht 67.0 in | Wt 271.0 lb

## 2020-02-14 DIAGNOSIS — G8929 Other chronic pain: Secondary | ICD-10-CM

## 2020-02-14 DIAGNOSIS — M25562 Pain in left knee: Secondary | ICD-10-CM

## 2020-02-14 DIAGNOSIS — M25561 Pain in right knee: Secondary | ICD-10-CM | POA: Diagnosis not present

## 2020-02-14 DIAGNOSIS — M545 Low back pain, unspecified: Secondary | ICD-10-CM

## 2020-02-14 DIAGNOSIS — M170 Bilateral primary osteoarthritis of knee: Secondary | ICD-10-CM | POA: Diagnosis not present

## 2020-02-14 NOTE — Assessment & Plan Note (Addendum)
Chronic problem with exacerbation.  Patient's low back pain does seem to be secondary to edema of the facet joints at L4-L5 the right think is contributing more than anything else.  I do want to get injections on both sides.  We will get new x-rays of the knees with patient having some mild increase in discomfort again as well.  Patient for the epidural or the facet injections will need approval secondary to him being on anticoagulation likely will be fine for him to hold for 5 days.  Patient will increase activity slowly.  Follow-up with me again 4 weeks after the epidural for further evaluation and treatment.

## 2020-02-14 NOTE — Patient Instructions (Addendum)
Xray of knees today Holding on the knees  See me again in 6 weeks

## 2020-02-21 ENCOUNTER — Other Ambulatory Visit: Payer: Self-pay

## 2020-02-21 DIAGNOSIS — M5416 Radiculopathy, lumbar region: Secondary | ICD-10-CM

## 2020-02-22 ENCOUNTER — Ambulatory Visit
Admission: RE | Admit: 2020-02-22 | Discharge: 2020-02-22 | Disposition: A | Discharge status: Home / Self Care | Payer: BC Managed Care – PPO | Source: Ambulatory Visit | Attending: Family Medicine | Admitting: Family Medicine

## 2020-02-22 ENCOUNTER — Other Ambulatory Visit: Payer: Self-pay | Admitting: Family Medicine

## 2020-02-22 ENCOUNTER — Other Ambulatory Visit: Payer: Self-pay

## 2020-02-22 ENCOUNTER — Other Ambulatory Visit: Payer: BC Managed Care – PPO

## 2020-02-22 ENCOUNTER — Telehealth: Payer: Self-pay

## 2020-02-22 DIAGNOSIS — M5416 Radiculopathy, lumbar region: Secondary | ICD-10-CM

## 2020-02-22 DIAGNOSIS — M47817 Spondylosis without myelopathy or radiculopathy, lumbosacral region: Secondary | ICD-10-CM | POA: Diagnosis not present

## 2020-02-22 MED ORDER — IOPAMIDOL (ISOVUE-M 200) INJECTION 41%
1.0000 mL | Freq: Once | INTRAMUSCULAR | Status: AC
  Administered 2020-02-22: 1 mL via INTRA_ARTICULAR

## 2020-02-22 MED ORDER — METHYLPREDNISOLONE ACETATE 40 MG/ML INJ SUSP (RADIOLOG
120.0000 mg | Freq: Once | INTRAMUSCULAR | Status: AC
  Administered 2020-02-22: 120 mg via INTRA_ARTICULAR

## 2020-02-22 NOTE — Telephone Encounter (Signed)
Dr. Tamala Julian attempted to do Peer to Peer to get facet injection approved on 02/21/2020. Unable to get approval. Called patient and left voicemail at end of day 02/21/2020 to call back that Dr. Tamala Julian has to change injection to epidural. Spoke with patient this morning, 02/22/2020 before facet injection appointment explaining to him that injection had to be switched due to insurance denial. Recommended that patient call [facility] prior to his appointment to make sure that they are able to perform the epidural in scheduled time spot instead of facet injection. Patient states that he will call [facility] and voices understanding of plan.

## 2020-02-22 NOTE — Discharge Instructions (Signed)

## 2020-02-26 ENCOUNTER — Other Ambulatory Visit: Payer: BC Managed Care – PPO

## 2020-02-28 ENCOUNTER — Other Ambulatory Visit: Payer: Self-pay | Admitting: Family Medicine

## 2020-03-08 ENCOUNTER — Ambulatory Visit: Payer: BC Managed Care – PPO | Admitting: Podiatry

## 2020-03-21 ENCOUNTER — Other Ambulatory Visit: Payer: Self-pay | Admitting: Podiatry

## 2020-03-28 NOTE — Progress Notes (Signed)
[facility] [address] [place] Phone: [phone] Subjective:   I [name] am serving as a Education administrator for Dr. Hulan Saas.  This visit occurred during the SARS-CoV-2 public health emergency.  Safety protocols were in place, including screening questions prior to the visit, additional usage of staff PPE, and extensive cleaning of exam room while observing appropriate contact time as indicated for disinfecting solutions.   I'm seeing this patient by the request  of:  [name], MD  CC: Bilateral knee pain follow-up  [id number]   02/14/2020 Chronic problem with exacerbation.  Patient's low back pain does seem to be secondary to edema of the facet joints at L4-L5 the right think is contributing more than anything else.  I do want to get injections on both sides.  We will get new x-rays of the knees with patient having some mild increase in discomfort again as well.  Patient for the epidural or the facet injections will need approval secondary to him being on anticoagulation likely will be fine for him to hold for 5 days.  Patient will increase activity slowly.  Follow-up with me again 4 weeks after the epidural for further evaluation and treatment.   Update 03/29/2020 [name] is a 54 y.o. male coming in with complaint of low back pain. Facet injections on 02/22/2020.  States that the back is doing much better.  Patient states he would like bilateral knee injections.  Has been seen for knee arthritis.  Has had a degenerative meniscal tears previously as well.  The left side did have invention but did not make an improvement and also was concerned because for the DVT.  Patient feels like the injections do help but does need them every 3 months.  Did not have a good response with viscosupplementation.      Past Medical History:  Diagnosis Date  . Anxiety   . Arthritis    knees - no med  . Cluster headache    Hx - resolved  per patient  . Depression   . DVT of lower extremity (deep venous thrombosis) ([place]) 6/26-28/2013   Xarelto - resolved - history  . HTN (hypertension)    currently no meds  . Irritability and anger    PMH of  . Kidney stone    passed stone, no surgery required.  . Rotator cuff arthropathy 2007   right  . Saddle pulmonary embolus ([place]) 6/26-28/2013   post orthopedic surgery - resolved  . Sleep apnea 5/15   moderate OSA-has cpap but does not use it   Past Surgical History:  Procedure Laterality Date  . arthroscopic knee surgery  10/09/2011   Dr Ninfa Linden  . CARDIAC CATHETERIZATION  2007   LVdysfunction; normal coronaries  . KNEE ARTHROSCOPY WITH MEDIAL MENISECTOMY Left 09/20/2014   medial menisectomy chondroplastytella medial plica excision  ;  Surgeon: Dorna Leitz, MD;  Location: [facility];  Service: Orthopedics;  Laterality: Left;  . ROTATOR CUFF REPAIR  2007   right  . UPPER GASTROINTESTINAL ENDOSCOPY  2007   normal  . WISDOM TOOTH EXTRACTION     Social History   Socioeconomic History  . Marital status: Married    Spouse name: Not on file  . Number of children: 6  . Years of education: 80  . Highest education level: Not on file  Occupational History  . Occupation: Glass blower/designer   . Occupation: Programmer, systems  Tobacco Use  .  Smoking status: Former Smoker    Types: Cigars  . Smokeless tobacco: Never Used  . Tobacco comment: every 3-4 months  Vaping Use  . Vaping Use: Never used  Substance and Sexual Activity  . Alcohol use: Yes    Alcohol/week: 0.0 standard drinks    Comment: drinks occasionally  . Drug use: No  . Sexual activity: Yes    Birth control/protection: Condom  Other Topics Concern  . Not on file  Social History Narrative   Fun: Exercise   Social Determinants of Health   Financial Resource Strain:   . Difficulty of Paying Living Expenses: Not on file  Food Insecurity:   . Worried About Charity fundraiser in the Last Year: Not  on file  . Ran Out of Food in the Last Year: Not on file  Transportation Needs:   . Lack of Transportation (Medical): Not on file  . Lack of Transportation (Non-Medical): Not on file  Physical Activity:   . Days of Exercise per Week: Not on file  . Minutes of Exercise per Session: Not on file  Stress:   . Feeling of Stress : Not on file  Social Connections:   . Frequency of Communication with Friends and Family: Not on file  . Frequency of Social Gatherings with Friends and Family: Not on file  . Attends Religious Services: Not on file  . Active Member of Clubs or Organizations: Not on file  . Attends Archivist Meetings: Not on file  . Marital Status: Not on file   Allergies  Allergen Reactions  . Tramadol Other (See Comments)    insomnia   Family History  Problem Relation Age of Onset  . Cancer Mother        Mesothelioma   . Diabetes Brother   . Hypertension Brother   . Stroke Brother 73  . Heart attack Brother 54  . Diabetes Maternal Aunt   . Diabetes Maternal Grandmother   . Heart disease Maternal Grandmother   . Hypertension Maternal Grandmother   . Stroke Maternal Grandmother        in 37s  . Deep vein thrombosis Maternal Grandmother   . Stroke Maternal Grandfather        > 55  . Migraines Brother   . Heart attack Brother 31  . Healthy Father   . Colon cancer Neg Hx   . Esophageal cancer Neg Hx   . Rectal cancer Neg Hx   . Stomach cancer Neg Hx   . Liver cancer Neg Hx   . Pancreatic cancer Neg Hx      Current Outpatient Medications (Cardiovascular):  .  indapamide (LOZOL) 1.25 MG tablet, Take 1 tablet (1.25 mg total) by mouth daily. .  nebivolol (BYSTOLIC) 5 MG tablet, Take 1 tablet (5 mg total) by mouth daily. .  rosuvastatin (CRESTOR) 10 MG tablet, Take 1 tablet (10 mg total) by mouth daily. .  sildenafil (REVATIO) 20 MG tablet, Take 3 tablets (60 mg total) by mouth daily as needed. .  tadalafil (CIALIS) 20 MG tablet, Take 1 tablet (20 mg  total) by mouth daily as needed for erectile dysfunction.  Current Outpatient Medications (Respiratory):  .  albuterol (VENTOLIN HFA) 108 (90 Base) MCG/ACT inhaler, INHALE 2 PUFFS INTO THE LUNGS EVERY 6 HOURS AS NEEDED FOR WHEEZING OR SHORTNESS OF BREATH .  fluticasone (FLONASE) 50 MCG/ACT nasal spray, Place 1 spray into both nostrils daily.  Current Outpatient Medications (Analgesics):  .  meloxicam (MOBIC) 15  MG tablet, TAKE 1 TABLET EVERY DAY  Current Outpatient Medications (Hematological):  .  rivaroxaban (XARELTO) 10 MG TABS tablet, Take 1 tablet (10 mg total) by mouth daily.  Current Outpatient Medications (Other):  [name]  Armodafinil 250 MG tablet, Take 1 tablet (250 mg total) by mouth daily. .  Cholecalciferol 50 MCG (2000 UT) TABS, Take 1 tablet (2,000 Units total) by mouth daily. .  fluocinonide cream (LIDEX) 5.42 %, Apply 1 application topically 2 (two) times daily. [name]  KLOR-CON M20 20 MEQ tablet, TAKE 1 TABLET BY MOUTH TWICE A DAY .  methocarbamol (ROBAXIN) 500 MG tablet, Take 1 tablet (500 mg total) by mouth at bedtime as needed for muscle spasms. .  Multiple Vitamin (MULTIVITAMIN WITH MINERALS) TABS tablet, Take 1 tablet by mouth daily. .  pantoprazole (PROTONIX) 40 MG tablet, Take 1 tablet (40 mg total) by mouth daily. [name]  tiZANidine (ZANAFLEX) 4 MG tablet, TAKE 1 TABLET (4 MG TOTAL) BY MOUTH AT BEDTIME. [name]  Vitamin D, Ergocalciferol, (DRISDOL) 1.25 MG (50000 UNIT) CAPS capsule, TAKE 1 CAPSULE (50,000 UNITS TOTAL) BY MOUTH EVERY 7 (SEVEN) DAYS. [name]  omeprazole (PRILOSEC) 40 MG capsule, Take 1 capsule (40 mg total) by mouth 2 (two) times daily for 14 days.   Reviewed prior external information including notes and imaging from  primary care provider As well as notes that were available from care everywhere and other healthcare systems.  Past medical history, social, surgical and family history all reviewed in electronic medical record.  No pertanent information unless stated  regarding to the chief complaint.   Review of Systems:  No headache, visual changes, nausea, vomiting, diarrhea, constipation, dizziness, abdominal pain, skin rash, fevers, chills, night sweats, weight loss, swollen lymph nodes, body aches, joint swelling, chest pain, shortness of breath, mood changes. POSITIVE muscle aches  Objective  Blood pressure (!) 150/90, pulse 84, height 5\' 7"  (1.702 m), weight 270 lb (122.5 kg), SpO2 96 %.   General: No apparent distress alert and oriented x3 mood and affect normal, dressed appropriately.  HEENT: Pupils equal, extraocular movements intact  Respiratory: Patient's speak in full sentences and does not appear short of breath  Cardiovascular: No lower extremity edema, non tender, no erythema  Gait mild antalgic MSK: Knee: Bilateral valgus deformity noted.  Abnormal thigh to calf ratio.  Tender to palpation over medial and PF joint line.  ROM full in flexion and extension and lower leg rotation. instability with valgus force.  painful patellar compression. Patellar glide with moderate crepitus. Patellar and quadriceps tendons unremarkable. Hamstring and quadriceps strength is normal.  After informed written and verbal consent, patient was seated on exam table. Right knee was prepped with alcohol swab and utilizing anterolateral approach, patient's right knee space was injected with 4:1  marcaine 0.5%: Kenalog 40mg /dL. Patient tolerated the procedure well without immediate complications.  After informed written and verbal consent, patient was seated on exam table. Left knee was prepped with alcohol swab and utilizing anterolateral approach, patient's left knee space was injected with 4:1  marcaine 0.5%: Kenalog 40mg /dL. Patient tolerated the procedure well without immediate complications.    Impression and Recommendations:     The above documentation has been reviewed and is accurate and complete Lyndal Pulley, DO

## 2020-03-29 ENCOUNTER — Ambulatory Visit (INDEPENDENT_AMBULATORY_CARE_PROVIDER_SITE_OTHER): Payer: BC Managed Care – PPO | Admitting: Family Medicine

## 2020-03-29 ENCOUNTER — Encounter: Payer: Self-pay | Admitting: Family Medicine

## 2020-03-29 ENCOUNTER — Other Ambulatory Visit: Payer: Self-pay

## 2020-03-29 DIAGNOSIS — M170 Bilateral primary osteoarthritis of knee: Secondary | ICD-10-CM | POA: Diagnosis not present

## 2020-03-29 NOTE — Assessment & Plan Note (Signed)
Repeat injections given again today.  Patient has known moderate to severe medial compartment arthritis.  We discussed the possibilities of advanced imaging again but patient did not have good response with the meniscectomy on the left knee previously and would not want to do anything else on the contralateral knee.  Encouraged him to avoid the anti-inflammatories while he is on the blood thinner.  Discussed home exercises encourage weight loss follow-up again in 3 months

## 2020-04-05 ENCOUNTER — Ambulatory Visit (INDEPENDENT_AMBULATORY_CARE_PROVIDER_SITE_OTHER): Payer: BC Managed Care – PPO | Admitting: Endocrinology

## 2020-04-05 ENCOUNTER — Encounter: Payer: Self-pay | Admitting: Endocrinology

## 2020-04-05 ENCOUNTER — Other Ambulatory Visit: Payer: Self-pay

## 2020-04-05 VITALS — BP 144/84 | HR 95 | Ht 67.0 in | Wt 266.0 lb

## 2020-04-05 DIAGNOSIS — E291 Testicular hypofunction: Secondary | ICD-10-CM | POA: Diagnosis not present

## 2020-04-05 DIAGNOSIS — N5201 Erectile dysfunction due to arterial insufficiency: Secondary | ICD-10-CM | POA: Diagnosis not present

## 2020-04-05 NOTE — Patient Instructions (Addendum)
Your blood pressure is high today.  Please see your primary care provider soon, to have it rechecked Blood tests are requested for you today.  It is more accurate in the morning.  We'll let you know about the results.  Please see a urology specialist.  you will receive a phone call, about a day and time for an appointment. I would be happy to see you back here as needed.

## 2020-04-05 NOTE — Progress Notes (Signed)
Subjective:    Patient ID: [name], male    DOB: Jan 31, 1966, 54 y.o.   MRN: [id number]  HPI Pt is referred by Dr Ronnald Ramp, for hypogonadism.  Pt reports he had puberty at the normal age.  He has 6 biological children.  He says he has never taken illicit androgens.  He has never been on any prescribed medication for hypogonadism, but he takes an uncertain product from a health food store.  He does not take antiandrogens or opioids.  He denies any h/o infertility, XRT, or genital infection.  He has never had pituitary imaging.  He has never had surgery, or a serious injury to the head or genital area. He has h/o .   He does not consume alcohol excessively.  He has OSA.  Main symptoms are fatigue and ED.   Past Medical History:  Diagnosis Date  . Anxiety   . Arthritis    knees - no med  . Cluster headache    Hx - resolved per patient  . Depression   . DVT of lower extremity (deep venous thrombosis) ([place]) 6/26-28/2013   Xarelto - resolved - history  . HTN (hypertension)    currently no meds  . Irritability and anger    PMH of  . Kidney stone    passed stone, no surgery required.  . Rotator cuff arthropathy 2007   right  . Saddle pulmonary embolus ([place]) 6/26-28/2013   post orthopedic surgery - resolved  . Sleep apnea 5/15   moderate OSA-has cpap but does not use it    Past Surgical History:  Procedure Laterality Date  . arthroscopic knee surgery  10/09/2011   Dr Ninfa Linden  . CARDIAC CATHETERIZATION  2007   LVdysfunction; normal coronaries  . KNEE ARTHROSCOPY WITH MEDIAL MENISECTOMY Left 09/20/2014   medial menisectomy chondroplastytella medial plica excision  ;  Surgeon: Dorna Leitz, MD;  Location: [facility];  Service: Orthopedics;  Laterality: Left;  . ROTATOR CUFF REPAIR  2007   right  . UPPER GASTROINTESTINAL ENDOSCOPY  2007   normal  . WISDOM TOOTH EXTRACTION      Social History   Socioeconomic History  . Marital status: Married    Spouse name: Not  on file  . Number of children: 6  . Years of education: 70  . Highest education level: Not on file  Occupational History  . Occupation: Glass blower/designer   . Occupation: Programmer, systems  Tobacco Use  . Smoking status: Former Smoker    Types: Cigars  . Smokeless tobacco: Never Used  . Tobacco comment: every 3-4 months  Vaping Use  . Vaping Use: Never used  Substance and Sexual Activity  . Alcohol use: Yes    Alcohol/week: 0.0 standard drinks    Comment: drinks occasionally  . Drug use: No  . Sexual activity: Yes    Birth control/protection: Condom  Other Topics Concern  . Not on file  Social History Narrative   Fun: Exercise   Social Determinants of Health   Financial Resource Strain:   . Difficulty of Paying Living Expenses: Not on file  Food Insecurity:   . Worried About Charity fundraiser in the Last Year: Not on file  . Ran Out of Food in the Last Year: Not on file  Transportation Needs:   . Lack of Transportation (Medical): Not on file  . Lack of Transportation (Non-Medical): Not on file  Physical Activity:   . Days of Exercise per  Week: Not on file  . Minutes of Exercise per Session: Not on file  Stress:   . Feeling of Stress : Not on file  Social Connections:   . Frequency of Communication with Friends and Family: Not on file  . Frequency of Social Gatherings with Friends and Family: Not on file  . Attends Religious Services: Not on file  . Active Member of Clubs or Organizations: Not on file  . Attends Archivist Meetings: Not on file  . Marital Status: Not on file  Intimate Partner Violence:   . Fear of Current or Ex-Partner: Not on file  . Emotionally Abused: Not on file  . Physically Abused: Not on file  . Sexually Abused: Not on file    Current Outpatient Medications on File Prior to Visit  Medication Sig Dispense Refill  . albuterol (VENTOLIN HFA) 108 (90 Base) MCG/ACT inhaler INHALE 2 PUFFS INTO THE LUNGS EVERY 6 HOURS AS NEEDED FOR  WHEEZING OR SHORTNESS OF BREATH 25.5 g 1  . Armodafinil 250 MG tablet Take 1 tablet (250 mg total) by mouth daily. 90 tablet 1  . Cholecalciferol 50 MCG (2000 UT) TABS Take 1 tablet (2,000 Units total) by mouth daily. 90 tablet 1  . fluocinonide cream (LIDEX) 6.20 % Apply 1 application topically 2 (two) times daily. 60 g 2  . fluticasone (FLONASE) 50 MCG/ACT nasal spray Place 1 spray into both nostrils daily.    . indapamide (LOZOL) 1.25 MG tablet Take 1 tablet (1.25 mg total) by mouth daily. 90 tablet 1  . KLOR-CON M20 20 MEQ tablet TAKE 1 TABLET BY MOUTH TWICE A DAY 180 tablet 0  . meloxicam (MOBIC) 15 MG tablet TAKE 1 TABLET EVERY DAY 30 tablet 2  . methocarbamol (ROBAXIN) 500 MG tablet Take 1 tablet (500 mg total) by mouth at bedtime as needed for muscle spasms. 30 tablet 1  . Multiple Vitamin (MULTIVITAMIN WITH MINERALS) TABS tablet Take 1 tablet by mouth daily.    . nebivolol (BYSTOLIC) 5 MG tablet Take 1 tablet (5 mg total) by mouth daily. 84 tablet 0  . pantoprazole (PROTONIX) 40 MG tablet Take 1 tablet (40 mg total) by mouth daily. 90 tablet 1  . rivaroxaban (XARELTO) 10 MG TABS tablet Take 1 tablet (10 mg total) by mouth daily. 90 tablet 1  . rosuvastatin (CRESTOR) 10 MG tablet Take 1 tablet (10 mg total) by mouth daily. 90 tablet 1  . sildenafil (REVATIO) 20 MG tablet Take 3 tablets (60 mg total) by mouth daily as needed. 60 tablet 5  . tadalafil (CIALIS) 20 MG tablet Take 1 tablet (20 mg total) by mouth daily as needed for erectile dysfunction. 6 tablet 5  . tiZANidine (ZANAFLEX) 4 MG tablet TAKE 1 TABLET (4 MG TOTAL) BY MOUTH AT BEDTIME. 90 tablet 1  . Vitamin D, Ergocalciferol, (DRISDOL) 1.25 MG (50000 UNIT) CAPS capsule TAKE 1 CAPSULE (50,000 UNITS TOTAL) BY MOUTH EVERY 7 (SEVEN) DAYS. 12 capsule 0  . omeprazole (PRILOSEC) 40 MG capsule Take 1 capsule (40 mg total) by mouth 2 (two) times daily for 14 days. 28 capsule 0  . [DISCONTINUED] albuterol (VENTOLIN HFA) 108 (90 Base) MCG/ACT  inhaler Inhale 2 puffs into the lungs every 6 (six) hours as needed for wheezing or shortness of breath. 18 g 1  . [DISCONTINUED] potassium chloride SA (KLOR-CON) 20 MEQ tablet Take 1 tablet (20 mEq total) by mouth 2 (two) times daily. 180 tablet 0   No current facility-administered medications on  file prior to visit.    Allergies  Allergen Reactions  . Tramadol Other (See Comments)    insomnia    Family History  Problem Relation Age of Onset  . Cancer Mother        Mesothelioma   . Diabetes Brother   . Hypertension Brother   . Stroke Brother 23  . Heart attack Brother 23  . Diabetes Maternal Aunt   . Diabetes Maternal Grandmother   . Heart disease Maternal Grandmother   . Hypertension Maternal Grandmother   . Stroke Maternal Grandmother        in 34s  . Deep vein thrombosis Maternal Grandmother   . Stroke Maternal Grandfather        > 55  . Migraines Brother   . Heart attack Brother 59  . Healthy Father   . Colon cancer Neg Hx   . Esophageal cancer Neg Hx   . Rectal cancer Neg Hx   . Stomach cancer Neg Hx   . Liver cancer Neg Hx   . Pancreatic cancer Neg Hx   . Other Neg Hx        low testosterone    BP (!) 144/84   Pulse 95   Ht 5\' 7"  (1.702 m)   Wt 266 lb (120.7 kg)   SpO2 95%   BMI 41.66 kg/m    Review of Systems denies numbness, erectile dysfunction, weight change, muscle weakness, headache, and sob.  Depression is much better recently.       Objective:   Physical Exam VS: see vs page GEN: no distress HEAD: head: no deformity eyes: no periorbital swelling, no proptosis external nose and ears are normal NECK: supple, thyroid is not enlarged CHEST WALL: no deformity LUNGS: clear to auscultation BREASTS:  No gynecomastia CV: reg rate and rhythm, no murmur GENITALIA:  Normal male.   MUSCULOSKELETAL: muscle bulk and strength are grossly normal.  no joint swelling is seen  gait is normal and steady.   EXTEMITIES: 1+ left leg edema, and vv's.      NEURO:  cn 2-12 grossly intact.  sensation is intact to touch on all 4's.  SKIN:  Normal texture and temperature.  No rash or suspicious lesion is visible.  Normal male hair pattern.   NODES:  None palpable at the neck.   PSYCH: alert, well-oriented.  Does not appear anxious nor depressed.    Lab Results  Component Value Date   WBC 10.4 12/22/2019   HGB 14.4 12/22/2019   HCT 43.1 12/22/2019   MCV 89.0 12/22/2019   PLT 272 12/22/2019   Lab Results  Component Value Date   TESTOSTERONE 447 02/08/2020    Lab Results  Component Value Date   TSH 1.25 12/22/2019   Prolactin=6  Lab Results  Component Value Date   PSA 0.3 12/22/2019   PSA 0.37 11/02/2018   PSA 0.36 01/20/2018   I have reviewed outside records, and summarized: Pt was noted to have low testosterone, and referred here.  he has h/o saddle embolism in 2013, after knee surgery.      Assessment & Plan:  Low testosterone, new to me, uncertain etiology and prognosis ED sxs, persistent PE's by hx.  He is a poor candidate for testosterone-increasing rx  Patient Instructions  Your blood pressure is high today.  Please see your primary care provider soon, to have it rechecked Blood tests are requested for you today.  It is more accurate in the morning.  We'll let you  know about the results.  Please see a urology specialist.  you will receive a phone call, about a day and time for an appointment. I would be happy to see you back here as needed.

## 2020-04-06 ENCOUNTER — Other Ambulatory Visit: Payer: BC Managed Care – PPO

## 2020-04-06 DIAGNOSIS — E291 Testicular hypofunction: Secondary | ICD-10-CM | POA: Diagnosis not present

## 2020-04-09 LAB — SPECIMEN STATUS REPORT

## 2020-04-09 LAB — TESTOSTERONE,FREE AND TOTAL
Testosterone, Free: 2.2 pg/mL — ABNORMAL LOW (ref 7.2–24.0)
Testosterone: 299 ng/dL (ref 264–916)

## 2020-04-10 ENCOUNTER — Encounter: Payer: Self-pay | Admitting: Endocrinology

## 2020-04-12 ENCOUNTER — Encounter: Payer: Self-pay | Admitting: Internal Medicine

## 2020-04-15 ENCOUNTER — Ambulatory Visit: Payer: BC Managed Care – PPO | Attending: Internal Medicine

## 2020-04-15 DIAGNOSIS — Z23 Encounter for immunization: Secondary | ICD-10-CM

## 2020-04-15 NOTE — Progress Notes (Signed)
   Covid-19 Vaccination Clinic  Name:  [name]    MRN: [id number] DOB: [birth year]  04/15/2020  [name] was observed post Covid-19 immunization for 15 minutes without incident. He was provided with Vaccine Information Sheet and instruction to access the V-Safe system.   [name] was instructed to call 911 with any severe reactions post vaccine: [name] Difficulty breathing  . Swelling of face and throat  . A fast heartbeat  . A bad rash all over body  . Dizziness and weakness   Immunizations Administered    Name Date Dose VIS Date Route   Pfizer COVID-19 Vaccine 04/15/2020  1:12 PM 0.3 mL 03/21/2020 Intramuscular   Manufacturer: [employer], [employer]   Lot: [id number]   [place]: [address]-1

## 2020-04-24 DIAGNOSIS — G4733 Obstructive sleep apnea (adult) (pediatric): Secondary | ICD-10-CM | POA: Diagnosis not present

## 2020-05-16 ENCOUNTER — Encounter: Payer: Self-pay | Admitting: Family Medicine

## 2020-05-20 ENCOUNTER — Other Ambulatory Visit: Payer: Self-pay | Admitting: Internal Medicine

## 2020-05-20 DIAGNOSIS — I10 Essential (primary) hypertension: Secondary | ICD-10-CM

## 2020-05-20 DIAGNOSIS — E876 Hypokalemia: Secondary | ICD-10-CM

## 2020-05-24 ENCOUNTER — Other Ambulatory Visit: Payer: Self-pay | Admitting: Family Medicine

## 2020-05-29 ENCOUNTER — Encounter: Payer: Self-pay | Admitting: Family Medicine

## 2020-05-29 ENCOUNTER — Ambulatory Visit: Payer: Self-pay

## 2020-05-29 ENCOUNTER — Other Ambulatory Visit: Payer: Self-pay

## 2020-05-29 ENCOUNTER — Ambulatory Visit (INDEPENDENT_AMBULATORY_CARE_PROVIDER_SITE_OTHER): Payer: BC Managed Care – PPO | Admitting: Family Medicine

## 2020-05-29 VITALS — BP 150/100 | HR 97 | Ht 67.0 in | Wt 272.8 lb

## 2020-05-29 DIAGNOSIS — M7551 Bursitis of right shoulder: Secondary | ICD-10-CM

## 2020-05-29 DIAGNOSIS — G8929 Other chronic pain: Secondary | ICD-10-CM

## 2020-05-29 DIAGNOSIS — M19011 Primary osteoarthritis, right shoulder: Secondary | ICD-10-CM | POA: Diagnosis not present

## 2020-05-29 DIAGNOSIS — M545 Low back pain, unspecified: Secondary | ICD-10-CM | POA: Diagnosis not present

## 2020-05-29 NOTE — Patient Instructions (Addendum)
Thank you for coming in today.  You had a R shoulder injection today.  Call or go to the ER if you develop a large red swollen joint with extreme pain or oozing puss.   Please call [facility] at [phone] to schedule your spine injection.    Let me know if the shots do not last.

## 2020-05-29 NOTE — Progress Notes (Signed)
I, [name], LAT, ATC, am serving as scribe for Dr. Clementeen Graham.  [name] is a 54 y.o. male who presents to [facility] today for f/u of R shoulder pain.  He was last seen by Dr. Katrinka Blazing for f/u of LBP and B knee pain and had B knee injections.  Since then, pt reports re-irritation of chronic R shoulder pain x 3 weeks.  He locates his pain to his R superior shoulder.  He has a hx of a prior R RC repair and has severe R AC joint arthritis per MRI in March 2021.  Radiating pain: No R shoulder mechanical symptoms: No Aggravating factors: R shoulder AROM above 90 deg Treatments tried: prior R ACJ injection on 09/13/19; Meloxicam; Methocarbamol  He would also like to discuss his low back pain that is re-irritated and would like to have repeat facet injections / ESI as per the ones done in September 2021.  Diagnostic imaging: R shoulder MRI- 08/20/19   Pertinent review of systems: No fevers or chills  Relevant historical information: Hypertension   Exam:  BP (!) 150/100 (BP Location: Right Arm, Patient Position: Sitting, Cuff Size: Large)   Pulse 97   Ht 5\' 7"  (1.702 m)   Wt 272 lb 12.8 oz (123.7 kg)   SpO2 95%   BMI 42.73 kg/m  General: Well Developed, well nourished, and in no acute distress.   MSK: Right shoulder normal-appearing Tender palpation AC joint.  Pain with abduction.  Intact strength.  Positive Hawkins and Neer's test.  L-spine normal-appearing decreased lumbar motion.  Lab and Radiology Results  Procedure: Real-time Ultrasound Guided Injection of AC joint and subacromial bursa right shoulder Device: Philips Affiniti 50G Images permanently stored and available for review in PACS Verbal informed consent obtained.  Discussed risks and benefits of procedure. Warned about infection bleeding damage to structures skin hypopigmentation and fat atrophy among others. Patient expresses understanding and agreement Time-out  conducted.   Noted no overlying erythema, induration, or other signs of local infection.   Skin prepped in a sterile fashion.   Local anesthesia: Topical Ethyl chloride.   With sterile technique and under real time ultrasound guidance:  20 mg of Kenalog and 1 mL of Marcaine injected into AC joint.  Then 40 mg of Kenalog and 2 mL of Marcaine injected into subacromial bursa  Fluid seen entering the joint and bursa.   Completed without difficulty   Pain immediately resolved suggesting accurate placement of the medication.   Advised to call if fevers/chills, erythema, induration, drainage, or persistent bleeding.   Images permanently stored and available for review in the ultrasound unit.  Impression: Technically successful ultrasound guided injection.      Assessment and Plan: 54 y.o. male with right shoulder pain due to [name] DJD and subacromial bursitis.  Plan for injection of both today.  Injection successful patient had moderate improvement in pain.  Additionally patient had L4-L5 bilateral facet injections in September of this year.  The pain has returned.  Plan to repeat steroid injections.  If the pain returns again relatively quickly would recommend proceeding to medial branch block and ablation.  Reviewed this plan with patient expresses understanding.   PDMP not reviewed this encounter. Orders Placed This Encounter  Procedures  . October LIMITED JOINT SPACE STRUCTURES UP RIGHT(NO LINKED CHARGES)    Order Specific Question:   Reason for Exam (SYMPTOM  OR DIAGNOSIS REQUIRED)    Answer:   R shoulder pain  Order Specific Question:   Preferred imaging location?    Answer:   Adult [facility]  . DG FACET JT INJ L /S SINGLE LEVEL LEFT W/FL/CT    Standing Status:   Future    Standing Expiration Date:   05/29/2021    Order Specific Question:   Reason for Exam (SYMPTOM  OR DIAGNOSIS REQUIRED)    Answer:   left and rt  L4 and L5    Order Specific Question:   Preferred  Imaging Location?    Answer:   [facility]    Order Specific Question:   Radiology Contrast Protocol - do NOT remove file path    Answer:   \\[email or web address]  . DG FACET JT INJ L /S SINGLE LEVEL RIGHT W/FL/CT    Standing Status:   Future    Standing Expiration Date:   05/29/2021    Order Specific Question:   Reason for Exam (SYMPTOM  OR DIAGNOSIS REQUIRED)    Answer:   left and right L4-L5    Order Specific Question:   Preferred Imaging Location?    Answer:   [facility]    Order Specific Question:   Radiology Contrast Protocol - do NOT remove file path    Answer:   \\[email or web address]\[email or web address]s.pdf   No orders of the defined types were placed in this encounter.    Discussed warning signs or symptoms. Please see discharge instructions. Patient expresses understanding.   The above documentation has been reviewed and is accurate and complete Clementeen Graham, M.D.

## 2020-05-31 ENCOUNTER — Telehealth: Payer: Self-pay | Admitting: Family Medicine

## 2020-05-31 NOTE — Telephone Encounter (Signed)
Pt returned call and states that the bruising is in the area of the ACJ.  Per Dr. Zollie Pee advice, inform pt to use ice in that area for 10-15 min several times a day and to use either Advil or Duexis to help w/ pain.  Pt verbalizes understanding and has no additional questions/concerns.

## 2020-05-31 NOTE — Telephone Encounter (Signed)
Called pt and LM in order to get more info regarding the bruising and warmth to the touch.  Pt had 2 separate injections at his visit (AC joint and subacromial).  Asked pt to verify where the bruising is specifically and what part of his shoulder is warm to the touch.  Asked pt to return call so we can get additional info.  Please advise w/ any additional information you would like.

## 2020-05-31 NOTE — Telephone Encounter (Signed)
Called pt again, left another VM asking him to call ASAP.

## 2020-05-31 NOTE — Telephone Encounter (Signed)
Pt had injection R shoulder 12/28. Woke up AM 12/29 with more pain than normal, a purple bruise in the injection area and is warm to the touch.  Not sure how to proceed.

## 2020-06-12 ENCOUNTER — Telehealth: Payer: Self-pay | Admitting: Family Medicine

## 2020-06-12 DIAGNOSIS — G8929 Other chronic pain: Secondary | ICD-10-CM

## 2020-06-12 NOTE — Telephone Encounter (Signed)
I put a new order in in epic.  I do not think you need to do anything beyond and notify [facility] imaging.  The very unusual thing here is that his insurance is going to want 2 rounds of medial branch blocks prior to approving the ablation.  I have never heard of this but that is what they demand.  The patient will also need to be notified as well.

## 2020-06-12 NOTE — Telephone Encounter (Signed)
Peer to Peer phone call for facet injection of lumbar spine.   Facet injection declined.   Medial branch block and ablation. BL L4-L5 medial branch block and ablation.  Medial branch block needs to be done 2x with a week in between.  Before ablation.   Valid Jan 14-28 2022.   Auth number: [id number]   We need to notify patient that we are moving to the next step procedure and we need to notify [facility] to not do facet injection on the 14th as scheduled. They can do the medial branch block instead.

## 2020-06-12 NOTE — Telephone Encounter (Signed)
I dont know if there is something special you all have to do with the change in order? Or if this has already been taken care of? I went to call and inform [name] not to do the injections on the 14th but seen those were already cancelled.

## 2020-06-13 NOTE — Telephone Encounter (Signed)
Called and Left [facility] imaging a message to call me back, Mychart message sent to patient with the information. Patient is cancelled for those facet injections.

## 2020-06-15 ENCOUNTER — Other Ambulatory Visit: Payer: BC Managed Care – PPO

## 2020-06-19 ENCOUNTER — Other Ambulatory Visit: Payer: BC Managed Care – PPO

## 2020-07-04 ENCOUNTER — Other Ambulatory Visit: Payer: Self-pay | Admitting: Internal Medicine

## 2020-07-04 DIAGNOSIS — E785 Hyperlipidemia, unspecified: Secondary | ICD-10-CM

## 2020-07-17 ENCOUNTER — Other Ambulatory Visit: Payer: Self-pay

## 2020-07-17 ENCOUNTER — Ambulatory Visit
Admission: RE | Admit: 2020-07-17 | Discharge: 2020-07-17 | Disposition: A | Discharge status: Home / Self Care | Payer: BC Managed Care – PPO | Source: Ambulatory Visit | Attending: Family Medicine | Admitting: Family Medicine

## 2020-07-17 ENCOUNTER — Other Ambulatory Visit: Payer: Self-pay | Admitting: Family Medicine

## 2020-07-17 DIAGNOSIS — G8929 Other chronic pain: Secondary | ICD-10-CM

## 2020-07-17 DIAGNOSIS — M545 Low back pain, unspecified: Secondary | ICD-10-CM

## 2020-07-17 NOTE — Discharge Instructions (Signed)

## 2020-07-18 ENCOUNTER — Other Ambulatory Visit: Payer: Self-pay | Admitting: Family Medicine

## 2020-07-18 DIAGNOSIS — M545 Low back pain, unspecified: Secondary | ICD-10-CM

## 2020-07-18 DIAGNOSIS — G8929 Other chronic pain: Secondary | ICD-10-CM

## 2020-07-18 NOTE — Telephone Encounter (Signed)
Returned Cathy's call and pt had there first procedure yesterday but due to his insurance restrictions, they were not able to the RFA portion and pt needs to come back in one week (reference Briana's prior note).  Therefore, a new order for the medial branch block identical to your original order from 06/12/20 needs to be placed.

## 2020-07-18 NOTE — Telephone Encounter (Signed)
Pt's wife called and reported the order was wrong and it should be bilateral.

## 2020-07-18 NOTE — Addendum Note (Signed)
Addended by: Gregor Hams on: 07/18/2020 01:58 PM   Modules accepted: Orders

## 2020-07-18 NOTE — Telephone Encounter (Signed)
[name] called back stating that the wrong one was ordered.  Should be ordered as a bilateral for the left and right

## 2020-07-18 NOTE — Telephone Encounter (Signed)
Medial branch block ordered again.  Please contact [facility] to schedule.  [phone].

## 2020-07-18 NOTE — Telephone Encounter (Signed)
[name] from [facility] called asking for an [employer] order.  Call back number if needed: [phone]

## 2020-07-19 NOTE — Addendum Note (Signed)
Addended by: Gregor Hams on: 07/19/2020 07:52 AM   Modules accepted: Orders

## 2020-07-19 NOTE — Telephone Encounter (Signed)
That's how I thought I ordered it. I have duplicated the order which may be what they needed. Please contact [facility] and let me know if there is any further issues.  Phone number to [place] is [phone].

## 2020-07-23 DIAGNOSIS — G4733 Obstructive sleep apnea (adult) (pediatric): Secondary | ICD-10-CM | POA: Diagnosis not present

## 2020-07-23 NOTE — Telephone Encounter (Signed)
Anderson Malta from [facility] called stating that she has been trying to get the second [employer] approved through insurance. They are denying it stating that he should have had 80% relief from the first one but the patient states he only has 50%. She asked if Dr Georgina Snell would like to do another peer to peer or what they need to do for the patient.   Anderson Malta: [phone]

## 2020-07-24 ENCOUNTER — Telehealth: Payer: Self-pay | Admitting: Pulmonary Disease

## 2020-07-24 ENCOUNTER — Encounter: Payer: Self-pay | Admitting: Adult Health

## 2020-07-24 DIAGNOSIS — G4733 Obstructive sleep apnea (adult) (pediatric): Secondary | ICD-10-CM

## 2020-07-24 NOTE — Telephone Encounter (Signed)
Called and spoke with pt and he stated that he feels like his cpap is not even on at night.  He stated that he feels that he cannot breath and does not feel the flow of air sometimes.  He feels that he gets choked at night.  RA please advise.   DME:  [facility]

## 2020-07-24 NOTE — Telephone Encounter (Signed)
Please obtain download to review - he may need increased pressure

## 2020-07-24 NOTE — Telephone Encounter (Signed)
Advised Jennifer/[facility] to cancel pt appt for tomorrow due to denial.  Peer to Peer info: Auth # [id number] Phone [phone]

## 2020-07-24 NOTE — Telephone Encounter (Signed)
Not sure what you are wanting to do about this denial? Do you want me to set up a peer to peer?

## 2020-07-25 ENCOUNTER — Inpatient Hospital Stay: Admission: RE | Admit: 2020-07-25 | Payer: BC Managed Care – PPO | Source: Ambulatory Visit

## 2020-07-25 ENCOUNTER — Other Ambulatory Visit: Payer: BC Managed Care – PPO

## 2020-07-25 NOTE — Telephone Encounter (Signed)
I printed a DL from [place] and faxed to [place] office (I am in [place])  Please check fax and place DL in Dr Bari Mantis lookat and route him back this msg, thanks!

## 2020-07-25 NOTE — Telephone Encounter (Signed)
Yes please set up p2p

## 2020-07-25 NOTE — Telephone Encounter (Signed)
Download has been placed in RA's look-at folder.

## 2020-07-26 NOTE — Telephone Encounter (Signed)
I reviewed CPAP download which shows good control of events on auto settings 10 to 15 cm with an EPR of 3 with average pressure of 13 and maximum pressure 14 cm. I reviewed that events are low up to 2/22 If he needs increased pressure, suggest that we change settings to fixed pressure of 14 cm and cut down ramp time to 5 minutes Please have him report back in 1 week to tell us how he is feeling

## 2020-07-26 NOTE — Telephone Encounter (Signed)
I have called and LM on VM for the pt to call triage back.

## 2020-07-27 NOTE — Telephone Encounter (Signed)
Pt returning a phone call. Pt can be reached at [phone]. Can be reached after [phone]

## 2020-07-27 NOTE — Telephone Encounter (Signed)
Called and spoke with patient. He verbalized understanding of recommendations. Wishes to go ahead and have the order sent [facility]. He will call us back after the settings have changed to let us know he is doing.   Nothing further needed at time of call.

## 2020-08-08 ENCOUNTER — Ambulatory Visit: Payer: BC Managed Care – PPO | Admitting: Family Medicine

## 2020-08-08 NOTE — Progress Notes (Signed)
[place] Phone: [phone] Subjective:   [name], am serving as a scribe for Dr. Hulan Saas. This visit occurred during the SARS-CoV-2 public health emergency.  Safety protocols were in place, including screening questions prior to the visit, additional usage of staff PPE, and extensive cleaning of exam room while observing appropriate contact time as indicated for disinfecting solutions.   I'm seeing this patient by the request  of:  [name], MD  CC: bilateral knee pain   [id number]   03/29/2020 Repeat injections given again today.  Patient has known moderate to severe medial compartment arthritis.  We discussed the possibilities of advanced imaging again but patient did not have good response with the meniscectomy on the left knee previously and would not want to do anything else on the contralateral knee.  Encouraged him to avoid the anti-inflammatories while he is on the blood thinner.  Discussed home exercises encourage weight loss follow-up again in 3 months  Update 08/09/2020 ABLE MALLOY is a 55 y.o. male coming in with complaint of bilateral knee pain. Patient states that pain in both knees have increased. His right shoulder pain is also bothering him.  Patient states that the knees are the worst though.  Giving him more instability.  Causing more of his back pain as well.  Patient states that there has been more swelling recently.  Mostly pain seems to be on the anterior and medial aspect of the knee but no increase in calf pain.  Patient continues to be on a blood thinner     knee xrays in 9/21- moderate OA of the medial knee bilaterally.    Past Medical History:  Diagnosis Date  . Anxiety   . Arthritis    knees - no med  . Cluster headache    Hx - resolved per patient  . Depression   . DVT of lower extremity (deep venous thrombosis) ([place]) 6/26-28/2013   Xarelto -  resolved - history  . HTN (hypertension)    currently no meds  . Irritability and anger    PMH of  . Kidney stone    passed stone, no surgery required.  . Rotator cuff arthropathy 2007   right  . Saddle pulmonary embolus ([place]) 6/26-28/2013   post orthopedic surgery - resolved  . Sleep apnea 5/15   moderate OSA-has cpap but does not use it   Past Surgical History:  Procedure Laterality Date  . arthroscopic knee surgery  10/09/2011   Dr Ninfa Linden  . CARDIAC CATHETERIZATION  2007   LVdysfunction; normal coronaries  . KNEE ARTHROSCOPY WITH MEDIAL MENISECTOMY Left 09/20/2014   medial menisectomy chondroplastytella medial plica excision  ;  Surgeon: Dorna Leitz, MD;  Location: [facility];  Service: Orthopedics;  Laterality: Left;  . ROTATOR CUFF REPAIR  2007   right  . UPPER GASTROINTESTINAL ENDOSCOPY  2007   normal  . WISDOM TOOTH EXTRACTION     Social History   Socioeconomic History  . Marital status: Married    Spouse name: Not on file  . Number of children: 6  . Years of education: 25  . Highest education level: Not on file  Occupational History  . Occupation: Glass blower/designer   . Occupation: Programmer, systems  Tobacco Use  . Smoking status: Former Smoker    Types: Cigars  . Smokeless tobacco: Never Used  . Tobacco comment: every 3-4 months  Vaping Use  .  Vaping Use: Never used  Substance and Sexual Activity  . Alcohol use: Yes    Alcohol/week: 0.0 standard drinks    Comment: drinks occasionally  . Drug use: No  . Sexual activity: Yes    Birth control/protection: Condom  Other Topics Concern  . Not on file  Social History Narrative   Fun: Exercise   Social Determinants of Health   Financial Resource Strain: Not on file  Food Insecurity: Not on file  Transportation Needs: Not on file  Physical Activity: Not on file  Stress: Not on file  Social Connections: Not on file   Allergies  Allergen Reactions  . Tramadol Other (See Comments)     insomnia   Family History  Problem Relation Age of Onset  . Cancer Mother        Mesothelioma   . Diabetes Brother   . Hypertension Brother   . Stroke Brother 35  . Heart attack Brother 44  . Diabetes Maternal Aunt   . Diabetes Maternal Grandmother   . Heart disease Maternal Grandmother   . Hypertension Maternal Grandmother   . Stroke Maternal Grandmother        in 64s  . Deep vein thrombosis Maternal Grandmother   . Stroke Maternal Grandfather        > 55  . Migraines Brother   . Heart attack Brother 65  . Healthy Father   . Colon cancer Neg Hx   . Esophageal cancer Neg Hx   . Rectal cancer Neg Hx   . Stomach cancer Neg Hx   . Liver cancer Neg Hx   . Pancreatic cancer Neg Hx   . Other Neg Hx        low testosterone     Current Outpatient Medications (Cardiovascular):  .  indapamide (LOZOL) 1.25 MG tablet, Take 1 tablet (1.25 mg total) by mouth daily. .  nebivolol (BYSTOLIC) 5 MG tablet, Take 1 tablet (5 mg total) by mouth daily. .  rosuvastatin (CRESTOR) 10 MG tablet, TAKE 1 TABLET BY MOUTH EVERY DAY .  sildenafil (REVATIO) 20 MG tablet, Take 3 tablets (60 mg total) by mouth daily as needed. .  tadalafil (CIALIS) 20 MG tablet, Take 1 tablet (20 mg total) by mouth daily as needed for erectile dysfunction.  Current Outpatient Medications (Respiratory):  .  albuterol (VENTOLIN HFA) 108 (90 Base) MCG/ACT inhaler, INHALE 2 PUFFS INTO THE LUNGS EVERY 6 HOURS AS NEEDED FOR WHEEZING OR SHORTNESS OF BREATH .  fluticasone (FLONASE) 50 MCG/ACT nasal spray, Place 1 spray into both nostrils daily.  Current Outpatient Medications (Analgesics):  .  meloxicam (MOBIC) 15 MG tablet, TAKE 1 TABLET EVERY DAY  Current Outpatient Medications (Hematological):  .  rivaroxaban (XARELTO) 10 MG TABS tablet, Take 1 tablet (10 mg total) by mouth daily.  Current Outpatient Medications (Other):  [name]  Armodafinil 250 MG tablet, Take 1 tablet (250 mg total) by mouth daily. .  Cholecalciferol 50 MCG  (2000 UT) TABS, Take 1 tablet (2,000 Units total) by mouth daily. .  fluocinonide cream (LIDEX) 5.78 %, Apply 1 application topically 2 (two) times daily. [name]  KLOR-CON M20 20 MEQ tablet, TAKE 1 TABLET BY MOUTH TWICE A DAY .  methocarbamol (ROBAXIN) 500 MG tablet, TAKE 1 TABLET (500 MG TOTAL) BY MOUTH AT BEDTIME AS NEEDED FOR MUSCLE SPASMS. .  Multiple Vitamin (MULTIVITAMIN WITH MINERALS) TABS tablet, Take 1 tablet by mouth daily. .  pantoprazole (PROTONIX) 40 MG tablet, Take 1 tablet (40 mg total) by  mouth daily. [name]  tiZANidine (ZANAFLEX) 4 MG tablet, TAKE 1 TABLET (4 MG TOTAL) BY MOUTH AT BEDTIME. [name]  Vitamin D, Ergocalciferol, (DRISDOL) 1.25 MG (50000 UNIT) CAPS capsule, TAKE 1 CAPSULE (50,000 UNITS TOTAL) BY MOUTH EVERY 7 (SEVEN) DAYS. [name]  omeprazole (PRILOSEC) 40 MG capsule, Take 1 capsule (40 mg total) by mouth 2 (two) times daily for 14 days.   Reviewed prior external information including notes and imaging from  primary care provider As well as notes that were available from care everywhere and other healthcare systems.  Past medical history, social, surgical and family history all reviewed in electronic medical record.  No pertanent information unless stated regarding to the chief complaint.   Review of Systems:  No headache, visual changes, nausea, vomiting, diarrhea, constipation, dizziness, abdominal pain, skin rash, fevers, chills, night sweats, weight loss, swollen lymph nodes,  joint swelling, chest pain, shortness of breath, mood changes. POSITIVE muscle aches, body aches  Objective  Blood pressure (!) 128/98, pulse 78, height 5\' 7"  (1.702 m), weight 272 lb (123.4 kg), SpO2 99 %.   General: No apparent distress alert and oriented x3 mood and affect normal, dressed appropriately.  HEENT: Pupils equal, extraocular movements intact  Respiratory: Patient's speak in full sentences and does not appear short of breath  Cardiovascular: No lower extremity edema, non tender, no erythema   Gait antalgic  MSK: Patient's knees bilaterally do show some mild effusion.  Patient actually lacks the last 10 degrees of flexion left greater than right.  Tender to palpation over the medial joint space.  Mild instability with valgus and varus force on the left knee  After informed written and verbal consent, patient was seated on exam table. Right knee was prepped with alcohol swab and utilizing anterolateral approach, patient's right knee space was injected with 4:1  marcaine 0.5%: Kenalog 40mg /dL. Patient tolerated the procedure well without immediate complications.  After informed written and verbal consent, patient was seated on exam table. Left knee was prepped with alcohol swab and utilizing anterolateral approach, patient's left knee space was injected with 4:1  marcaine 0.5%: Kenalog 40mg /dL. Patient tolerated the procedure well without immediate complications.    Impression and Recommendations:     The above documentation has been reviewed and is accurate and complete Lyndal Pulley, DO

## 2020-08-09 ENCOUNTER — Ambulatory Visit (INDEPENDENT_AMBULATORY_CARE_PROVIDER_SITE_OTHER): Payer: BC Managed Care – PPO | Admitting: Family Medicine

## 2020-08-09 ENCOUNTER — Other Ambulatory Visit: Payer: Self-pay

## 2020-08-09 ENCOUNTER — Encounter: Payer: Self-pay | Admitting: Family Medicine

## 2020-08-09 DIAGNOSIS — M19011 Primary osteoarthritis, right shoulder: Secondary | ICD-10-CM | POA: Diagnosis not present

## 2020-08-09 DIAGNOSIS — M170 Bilateral primary osteoarthritis of knee: Secondary | ICD-10-CM | POA: Diagnosis not present

## 2020-08-09 DIAGNOSIS — M545 Low back pain, unspecified: Secondary | ICD-10-CM | POA: Diagnosis not present

## 2020-08-09 DIAGNOSIS — G8929 Other chronic pain: Secondary | ICD-10-CM

## 2020-08-09 NOTE — Assessment & Plan Note (Signed)
Patient has seen another physician for this at this time.  Was to get injections but unfortunately was denied recently.  They are working on it at the moment.  Otherwise patient may need to consider surgical intervention.  We did discuss with patient having multiple polyarthralgias we could consider labs but patient would like to hold at this time.

## 2020-08-09 NOTE — Assessment & Plan Note (Signed)
Repeat injection again  Worsening pain.  He does have known moderate arthritic changes.  Has responded somewhat to the viscosupplementation and we may consider approved.  Patient will follow up with me again 6 weeks

## 2020-08-09 NOTE — Patient Instructions (Signed)
Injected knee today Will get approval for gel Arnica lotion See me again in 6 weeks to f/u on knees and shoulder

## 2020-08-09 NOTE — Assessment & Plan Note (Signed)
Patient did not respond as well to injection from another provider.  We will consider repeat in 6 weeks if necessary.

## 2020-08-16 ENCOUNTER — Other Ambulatory Visit: Payer: Self-pay | Admitting: Internal Medicine

## 2020-08-16 ENCOUNTER — Ambulatory Visit: Payer: BC Managed Care – PPO | Admitting: Family Medicine

## 2020-08-16 DIAGNOSIS — E876 Hypokalemia: Secondary | ICD-10-CM

## 2020-08-16 DIAGNOSIS — T502X5A Adverse effect of carbonic-anhydrase inhibitors, benzothiadiazides and other diuretics, initial encounter: Secondary | ICD-10-CM

## 2020-08-16 DIAGNOSIS — I10 Essential (primary) hypertension: Secondary | ICD-10-CM

## 2020-08-31 ENCOUNTER — Other Ambulatory Visit: Payer: Self-pay | Admitting: Internal Medicine

## 2020-08-31 DIAGNOSIS — N529 Male erectile dysfunction, unspecified: Secondary | ICD-10-CM

## 2020-09-04 ENCOUNTER — Encounter (HOSPITAL_COMMUNITY): Payer: Self-pay

## 2020-09-04 ENCOUNTER — Other Ambulatory Visit: Payer: Self-pay

## 2020-09-04 ENCOUNTER — Emergency Department (HOSPITAL_COMMUNITY)
Admission: EM | Admit: 2020-09-04 | Discharge: 2020-09-04 | Disposition: A | Discharge status: Home / Self Care | Payer: BC Managed Care – PPO | Attending: Emergency Medicine | Admitting: Emergency Medicine

## 2020-09-04 ENCOUNTER — Emergency Department (HOSPITAL_COMMUNITY): Payer: BC Managed Care – PPO

## 2020-09-04 DIAGNOSIS — R079 Chest pain, unspecified: Secondary | ICD-10-CM | POA: Diagnosis not present

## 2020-09-04 DIAGNOSIS — R059 Cough, unspecified: Secondary | ICD-10-CM | POA: Insufficient documentation

## 2020-09-04 DIAGNOSIS — R0789 Other chest pain: Secondary | ICD-10-CM | POA: Diagnosis not present

## 2020-09-04 DIAGNOSIS — R072 Precordial pain: Secondary | ICD-10-CM | POA: Diagnosis not present

## 2020-09-04 DIAGNOSIS — R5383 Other fatigue: Secondary | ICD-10-CM | POA: Diagnosis not present

## 2020-09-04 DIAGNOSIS — Z7901 Long term (current) use of anticoagulants: Secondary | ICD-10-CM | POA: Insufficient documentation

## 2020-09-04 DIAGNOSIS — R0682 Tachypnea, not elsewhere classified: Secondary | ICD-10-CM | POA: Insufficient documentation

## 2020-09-04 DIAGNOSIS — I10 Essential (primary) hypertension: Secondary | ICD-10-CM | POA: Diagnosis not present

## 2020-09-04 DIAGNOSIS — Z79899 Other long term (current) drug therapy: Secondary | ICD-10-CM | POA: Insufficient documentation

## 2020-09-04 DIAGNOSIS — Z7952 Long term (current) use of systemic steroids: Secondary | ICD-10-CM | POA: Diagnosis not present

## 2020-09-04 DIAGNOSIS — Z87891 Personal history of nicotine dependence: Secondary | ICD-10-CM | POA: Diagnosis not present

## 2020-09-04 DIAGNOSIS — R0602 Shortness of breath: Secondary | ICD-10-CM | POA: Insufficient documentation

## 2020-09-04 DIAGNOSIS — R42 Dizziness and giddiness: Secondary | ICD-10-CM | POA: Insufficient documentation

## 2020-09-04 LAB — CBC
HCT: 43.4 % (ref 39.0–52.0)
Hemoglobin: 14 g/dL (ref 13.0–17.0)
MCH: 29 pg (ref 26.0–34.0)
MCHC: 32.3 g/dL (ref 30.0–36.0)
MCV: 90 fL (ref 80.0–100.0)
Platelets: 226 10*3/uL (ref 150–400)
RBC: 4.82 MIL/uL (ref 4.22–5.81)
RDW: 13.2 % (ref 11.5–15.5)
WBC: 10 10*3/uL (ref 4.0–10.5)
nRBC: 0 % (ref 0.0–0.2)

## 2020-09-04 LAB — BASIC METABOLIC PANEL
Anion gap: 6 (ref 5–15)
BUN: 11 (ref 4–21)
BUN: 8 mg/dL (ref 6–20)
CO2: 27 mmol/L (ref 22–32)
CO2: 28 — AB (ref 13–22)
Calcium: 9 mg/dL (ref 8.9–10.3)
Chloride: 101 (ref 99–108)
Chloride: 104 mmol/L (ref 98–111)
Creatinine, Ser: 0.85 mg/dL (ref 0.61–1.24)
Creatinine: 0.8 (ref 0.6–1.3)
GFR, Estimated: >60 mL/min (ref >60)
Glucose, Bld: 114 mg/dL — ABNORMAL HIGH (ref 70–99)
Glucose: 86
Potassium: 3.9 mmol/L (ref 3.5–5.1)
Potassium: 4.5 (ref 3.4–5.3)
Sodium: 137 mmol/L (ref 135–145)
Sodium: 141 (ref 137–147)

## 2020-09-04 LAB — COMPREHENSIVE METABOLIC PANEL
Albumin: 4.2 (ref 3.5–5.0)
Calcium: 9.5 (ref 8.7–10.7)
GFR calc Af Amer: 115

## 2020-09-04 LAB — HEPATIC FUNCTION PANEL
ALT: 27 (ref 10–40)
AST: 19 (ref 14–40)
Alkaline Phosphatase: 53 (ref 25–125)
Bilirubin, Total: 0.4

## 2020-09-04 LAB — URINALYSIS, ROUTINE W REFLEX MICROSCOPIC
Bilirubin Urine: NEGATIVE
Glucose, UA: NEGATIVE mg/dL
Hgb urine dipstick: NEGATIVE
Ketones, ur: NEGATIVE mg/dL
Leukocytes,Ua: NEGATIVE
Nitrite: NEGATIVE
Protein, ur: NEGATIVE mg/dL
Specific Gravity, Urine: 1.004 — ABNORMAL LOW (ref 1.005–1.030)
pH: 6 (ref 5.0–8.0)

## 2020-09-04 LAB — CBC AND DIFFERENTIAL
HCT: 44 (ref 41–53)
Hemoglobin: 14.3 (ref 13.5–17.5)
Platelets: 255 (ref 150–399)
WBC: 9.8

## 2020-09-04 LAB — TROPONIN I (HIGH SENSITIVITY)
Troponin I (High Sensitivity): 3 ng/L (ref <18)
Troponin I (High Sensitivity): 3 ng/L (ref <18)

## 2020-09-04 LAB — LIPID PANEL
Cholesterol: 125 (ref 0–200)
HDL: 52 (ref 35–70)
LDL Cholesterol: 57
Triglycerides: 79 (ref 40–160)

## 2020-09-04 LAB — D-DIMER, QUANTITATIVE: D-Dimer, Quant: 0.64 ug/mL-FEU — ABNORMAL HIGH (ref 0.00–0.50)

## 2020-09-04 LAB — TSH: TSH: 1.48 (ref 0.41–5.90)

## 2020-09-04 MED ORDER — CYCLOBENZAPRINE HCL 10 MG PO TABS
10.0000 mg | ORAL_TABLET | Freq: Once | ORAL | Status: AC
  Administered 2020-09-04: 10 mg via ORAL
  Filled 2020-09-04: qty 1

## 2020-09-04 MED ORDER — IOHEXOL 350 MG/ML SOLN
100.0000 mL | Freq: Once | INTRAVENOUS | Status: AC | PRN
  Administered 2020-09-04: 100 mL via INTRAVENOUS

## 2020-09-04 NOTE — ED Provider Notes (Signed)
[facility] EMERGENCY DEPARTMENT Provider Note   CSN: [id number] Arrival date & time: 09/04/20  1155     History Chief Complaint  Patient presents with  . Chest Pain    [name] is a 55 y.o. male.  HPI   55 year old male past medical history of HTN, factor V with previous DVT anticoagulated on Xarelto, HLD presents the emergency department with sharp chest pain/shortness of breath and dizziness that started this morning.  Patient states prior to arrival he developed midsternal chest heaviness and shortness of breath.  He was initially evaluated at [facility] where they did a chest x-ray, he states it was unremarkable to give him sublingual nitroglycerin and referred him here for further cardiac work-up.  Patient states that his discomfort is improved on arrival.  States his been compliant with his medications.  He has an intermittent cough that is not acutely changed.  Denies any fever, vomiting, diarrhea.  He does have intermittent swelling of his lower extremities that is currently improved.  Past Medical History:  Diagnosis Date  . Anxiety   . Arthritis    knees - no med  . Cluster headache    Hx - resolved per patient  . Depression   . DVT of lower extremity (deep venous thrombosis) ([place]) 6/26-28/2013   Xarelto - resolved - history  . HTN (hypertension)    currently no meds  . Irritability and anger    PMH of  . Kidney stone    passed stone, no surgery required.  . Rotator cuff arthropathy 2007   right  . Saddle pulmonary embolus ([place]) 6/26-28/2013   post orthopedic surgery - resolved  . Sleep apnea 5/15   moderate OSA-has cpap but does not use it    Patient Active Problem List   Diagnosis Date Noted  . Hypogonadism male 02/10/2020  . Erectile dysfunction due to arterial insufficiency 02/08/2020  . Intrinsic eczema 02/08/2020  . H. pylori infection 01/25/2020  . Low back pain 12/23/2019  . Excessive somnolence disorder  05/09/2019  . AC (acromioclavicular) arthritis 05/03/2019  . Subacromial bursitis of right shoulder joint 05/03/2019  . Diuretic-induced hypokalemia 02/03/2019  . Ejection fraction < 50% 02/03/2019  . Vitamin D deficiency disease 11/03/2018  . PUD (peptic ulcer disease) 01/20/2018  . Renal stone 08/26/2017  . Morbid obesity ([place]) 08/25/2017  . GERD with esophagitis 08/24/2017  . Degenerative arthritis of knee, bilateral 06/08/2017  . Venous insufficiency of both lower extremities 10/05/2015  . Routine general medical examination at a health care facility 08/21/2015  . Heterozygous factor V Leiden mutation ([place]) 08/24/2013  . OSA (obstructive sleep apnea) 08/13/2013  . Hyperglycemia 11/10/2008  . Mixed hyperlipidemia 07/06/2008  . Essential hypertension 07/06/2008  . ERECTILE DYSFUNCTION, ORGANIC 01/28/2007    Past Surgical History:  Procedure Laterality Date  . arthroscopic knee surgery  10/09/2011   Dr Ninfa Linden  . CARDIAC CATHETERIZATION  2007   LVdysfunction; normal coronaries  . KNEE ARTHROSCOPY WITH MEDIAL MENISECTOMY Left 09/20/2014   medial menisectomy chondroplastytella medial plica excision  ;  Surgeon: Dorna Leitz, MD;  Location: [facility];  Service: Orthopedics;  Laterality: Left;  . ROTATOR CUFF REPAIR  2007   right  . UPPER GASTROINTESTINAL ENDOSCOPY  2007   normal  . WISDOM TOOTH EXTRACTION         Family History  Problem Relation Age of Onset  . Cancer Mother        Mesothelioma   .  Diabetes Brother   . Hypertension Brother   . Stroke Brother 70  . Heart attack Brother 65  . Diabetes Maternal Aunt   . Diabetes Maternal Grandmother   . Heart disease Maternal Grandmother   . Hypertension Maternal Grandmother   . Stroke Maternal Grandmother        in 38s  . Deep vein thrombosis Maternal Grandmother   . Stroke Maternal Grandfather        > 55  . Migraines Brother   . Heart attack Brother 70  . Healthy Father   . Colon cancer Neg Hx   .  Esophageal cancer Neg Hx   . Rectal cancer Neg Hx   . Stomach cancer Neg Hx   . Liver cancer Neg Hx   . Pancreatic cancer Neg Hx   . Other Neg Hx        low testosterone    Social History   Tobacco Use  . Smoking status: Former Smoker    Types: Cigars  . Smokeless tobacco: Never Used  . Tobacco comment: every 3-4 months  Vaping Use  . Vaping Use: Never used  Substance Use Topics  . Alcohol use: Yes    Alcohol/week: 0.0 standard drinks    Comment: drinks occasionally  . Drug use: No    Home Medications Prior to Admission medications   Medication Sig Start Date End Date Taking? Authorizing Provider  albuterol (VENTOLIN HFA) 108 (90 Base) MCG/ACT inhaler INHALE 2 PUFFS INTO THE LUNGS EVERY 6 HOURS AS NEEDED FOR WHEEZING OR SHORTNESS OF BREATH 09/10/19   [name], MD  Armodafinil 250 MG tablet Take 1 tablet (250 mg total) by mouth daily. 02/08/20   [name], MD  Cholecalciferol 50 MCG (2000 UT) TABS Take 1 tablet (2,000 Units total) by mouth daily. 09/08/19   [name], MD  fluocinonide cream (LIDEX) 4.09 % Apply 1 application topically 2 (two) times daily. 02/08/20   [name], MD  fluticasone (FLONASE) 50 MCG/ACT nasal spray Place 1 spray into both nostrils daily. 12/16/18   [provider]  indapamide (LOZOL) 1.25 MG tablet Take 1 tablet (1.25 mg total) by mouth daily. 09/08/19   [name], MD  KLOR-CON M20 20 MEQ tablet TAKE 1 TABLET BY MOUTH TWICE A DAY 08/16/20   [name], MD  meloxicam (MOBIC) 15 MG tablet TAKE 1 TABLET EVERY DAY 03/21/20   Hyatt, Max T, DPM  methocarbamol (ROBAXIN) 500 MG tablet TAKE 1 TABLET (500 MG TOTAL) BY MOUTH AT BEDTIME AS NEEDED FOR MUSCLE SPASMS. 05/24/20   Lyndal Pulley, DO  Multiple Vitamin (MULTIVITAMIN WITH MINERALS) TABS tablet Take 1 tablet by mouth daily.    [provider]  nebivolol (BYSTOLIC) 5 MG tablet Take 1 tablet (5 mg total) by mouth daily. 12/22/19   [name], MD  omeprazole  (PRILOSEC) 40 MG capsule Take 1 capsule (40 mg total) by mouth 2 (two) times daily for 14 days. 01/25/20 02/08/20  Zehr, Laban Emperor, PA-C  pantoprazole (PROTONIX) 40 MG tablet Take 1 tablet (40 mg total) by mouth daily. 12/22/19   [name], MD  rivaroxaban (XARELTO) 10 MG TABS tablet Take 1 tablet (10 mg total) by mouth daily. 12/22/19   [name], MD  rosuvastatin (CRESTOR) 10 MG tablet TAKE 1 TABLET BY MOUTH EVERY DAY 07/04/20   [name], MD  sildenafil (REVATIO) 20 MG tablet Take 3 tablets (60 mg total) by mouth daily  as needed. 02/03/20   [name], MD  tadalafil (CIALIS) 20 MG tablet Take 1 tablet (20 mg total) by mouth daily as needed for erectile dysfunction. 08/31/20   [name], MD  tiZANidine (ZANAFLEX) 4 MG tablet TAKE 1 TABLET (4 MG TOTAL) BY MOUTH AT BEDTIME. 02/28/20   Lyndal Pulley, DO  Vitamin D, Ergocalciferol, (DRISDOL) 1.25 MG (50000 UNIT) CAPS capsule TAKE 1 CAPSULE (50,000 UNITS TOTAL) BY MOUTH EVERY 7 (SEVEN) DAYS. 09/26/19   Lyndal Pulley, DO    Allergies    Tramadol  Review of Systems   Review of Systems  Constitutional: Positive for fatigue. Negative for chills and fever.  HENT: Negative for congestion.   Eyes: Negative for visual disturbance.  Respiratory: Positive for shortness of breath.   Cardiovascular: Positive for chest pain and leg swelling. Negative for palpitations.  Gastrointestinal: Negative for abdominal pain, diarrhea and vomiting.  Genitourinary: Negative for dysuria.  Skin: Negative for rash.  Neurological: Negative for headaches.    Physical Exam Updated Vital Signs BP (!) 128/92   Pulse 65   Temp (!) 97.4 F (36.3 C) (Oral)   Resp (!) 22   SpO2 98%   Physical Exam Vitals and nursing note reviewed.  Constitutional:      Appearance: Normal appearance.  HENT:     Head: Normocephalic.     Mouth/Throat:     Mouth: Mucous membranes are moist.  Cardiovascular:     Rate and Rhythm: Normal rate.  Pulmonary:      Effort: Pulmonary effort is normal. No tachypnea or respiratory distress.     Breath sounds: No wheezing or rales.  Abdominal:     Palpations: Abdomen is soft.     Tenderness: There is no abdominal tenderness.  Musculoskeletal:     Right lower leg: No edema.     Left lower leg: No edema.  Skin:    General: Skin is warm.  Neurological:     Mental Status: He is alert and oriented to person, place, and time. Mental status is at baseline.  Psychiatric:        Mood and Affect: Mood normal.     ED Results / Procedures / Treatments   Labs (all labs ordered are listed, but only abnormal results are displayed) Labs Reviewed  BASIC METABOLIC PANEL - Abnormal; Notable for the following components:      Result Value   Glucose, Bld 114 (*)    All other components within normal limits  CBC  URINALYSIS, ROUTINE W REFLEX MICROSCOPIC  TROPONIN I (HIGH SENSITIVITY)    EKG EKG Interpretation  Date/Time:  Tuesday September 04 2020 11:59:38 EDT Ventricular Rate:  78 PR Interval:  144 QRS Duration: 94 QT Interval:  368 QTC Calculation: 419 R Axis:   33 Text Interpretation: Normal sinus rhythm Incomplete right bundle branch block Nonspecific T wave abnormality Abnormal ECG Unchanged from previous Confirmed by Lavenia Atlas [phone]) on 09/04/2020 12:46:57 PM   Radiology DG Chest 2 View  Result Date: 09/04/2020 CLINICAL DATA:  Chest pain and shortness of breath EXAM: CHEST - 2 VIEW COMPARISON:  February 01, 2019 chest radiograph; chest CT April 22, 2019 FINDINGS: Lungs are clear. Heart size and pulmonary vascularity are normal. No adenopathy. No pneumothorax. No bone lesions. IMPRESSION: Lungs clear.  Cardiac silhouette normal. Electronically Signed   By: Lowella Grip III M.D.   On: 09/04/2020 13:30    Procedures Procedures   Medications Ordered in ED Medications - No data  to display  ED Course  I have reviewed the triage vital signs and the nursing notes.  Pertinent labs & imaging  results that were available during my care of the patient were reviewed by me and considered in my medical decision making (see chart for details).    MDM Rules/Calculators/A&P                           55 year old male presents the emergency department with concern for chest discomfort associated with shortness of breath and lightheadedness/dizziness.  Patient was tachypneic on arrival but otherwise stable vitals.  EKG is unchanged from previous.  Blood work is reassuring with 2 - troponins, although the patient was anticoagulated a D-dimer was done given his history of factor V and his symptoms.  So is elevated at 0.64, plan for CT PE and reevaluation. Patient signed out to Dr. Billy Fischer pending results.  Final Clinical Impression(s) / ED Diagnoses Final diagnoses:  None    Rx / DC Orders ED Discharge Orders    None       Lorelle Gibbs, DO 09/04/20 1609

## 2020-09-04 NOTE — ED Triage Notes (Addendum)
Pt c/o left sided CP/SOB and dizziness since this am. Pain was radiating to left arm PTA but isn't now. Pt reports onset of pain while waking up in bed this am. Took ASA 81mg  x2 PTA. Pt was seen at [facility] today for same. CXR done and pt thinks he received nitro SL. Hx of PE/DVT.

## 2020-09-04 NOTE — ED Provider Notes (Signed)
  Physical Exam  BP (!) 140/94   Pulse 67   Temp (!) 97.4 F (36.3 C) (Oral)   Resp (!) 28   SpO2 98%   Physical Exam  ED Course/Procedures     Procedures  MDM    Received care of patient at 4 PM from Dr. Dina Rich.  Please see her note for prior history, physical and care.  Briefly, this is a 55 year old male with a history of hypertension, factor V Leiden with previous DVT on Xarelto, hyperlipidemia, who presents with concern for chest pain, shortness of breath and dizziness.  CT PE study completed showing no evidence of PE.  Delta troponins are negative.  Low suspicion for ACS.  There are atypical features of pain, including it being worse with certain or small movements, and not being exertional.  Recommend outpatient cardiology follow-up and return for new or worsening symptoms.    Gareth Morgan, MD 09/05/20 1205

## 2020-09-18 NOTE — Progress Notes (Deleted)
[place] Phone: [phone] Subjective:    I'm seeing this patient by the request  of:  [name], MD  CC:   [id number]   08/09/2020 Patient did not respond as well to injection from another provider.  We will consider repeat in 6 weeks if necessary.  Patient has seen another physician for this at this time.  Was to get injections but unfortunately was denied recently.  They are working on it at the moment.  Otherwise patient may need to consider surgical intervention.  We did discuss with patient having multiple polyarthralgias we could consider labs but patient would like to hold at this time.  Update 09/19/2020 [name] is a 55 y.o. male coming in with complaint of R shoulder and B knee pain.   Onset-  Location Duration-  Character- Aggravating factors- Reliving factors-  Therapies tried-  Severity-     Past Medical History:  Diagnosis Date  . Anxiety   . Arthritis    knees - no med  . Cluster headache    Hx - resolved per patient  . Depression   . DVT of lower extremity (deep venous thrombosis) ([place]) 6/26-28/2013   Xarelto - resolved - history  . HTN (hypertension)    currently no meds  . Irritability and anger    PMH of  . Kidney stone    passed stone, no surgery required.  . Rotator cuff arthropathy 2007   right  . Saddle pulmonary embolus ([place]) 6/26-28/2013   post orthopedic surgery - resolved  . Sleep apnea 5/15   moderate OSA-has cpap but does not use it   Past Surgical History:  Procedure Laterality Date  . arthroscopic knee surgery  10/09/2011   Dr Ninfa Linden  . CARDIAC CATHETERIZATION  2007   LVdysfunction; normal coronaries  . KNEE ARTHROSCOPY WITH MEDIAL MENISECTOMY Left 09/20/2014   medial menisectomy chondroplastytella medial plica excision  ;  Surgeon: Dorna Leitz, MD;  Location: [facility];  Service: Orthopedics;  Laterality: Left;  .  ROTATOR CUFF REPAIR  2007   right  . UPPER GASTROINTESTINAL ENDOSCOPY  2007   normal  . WISDOM TOOTH EXTRACTION     Social History   Socioeconomic History  . Marital status: Married    Spouse name: Not on file  . Number of children: 6  . Years of education: 60  . Highest education level: Not on file  Occupational History  . Occupation: Glass blower/designer   . Occupation: Programmer, systems  Tobacco Use  . Smoking status: Former Smoker    Types: Cigars  . Smokeless tobacco: Never Used  . Tobacco comment: every 3-4 months  Vaping Use  . Vaping Use: Never used  Substance and Sexual Activity  . Alcohol use: Yes    Alcohol/week: 0.0 standard drinks    Comment: drinks occasionally  . Drug use: No  . Sexual activity: Yes    Birth control/protection: Condom  Other Topics Concern  . Not on file  Social History Narrative   Fun: Exercise   Social Determinants of Health   Financial Resource Strain: Not on file  Food Insecurity: Not on file  Transportation Needs: Not on file  Physical Activity: Not on file  Stress: Not on file  Social Connections: Not on file   Allergies  Allergen Reactions  . Tramadol Other (See Comments)    insomnia   Family History  Problem Relation Age  of Onset  . Cancer Mother        Mesothelioma   . Diabetes Brother   . Hypertension Brother   . Stroke Brother 41  . Heart attack Brother 54  . Diabetes Maternal Aunt   . Diabetes Maternal Grandmother   . Heart disease Maternal Grandmother   . Hypertension Maternal Grandmother   . Stroke Maternal Grandmother        in 59s  . Deep vein thrombosis Maternal Grandmother   . Stroke Maternal Grandfather        > 55  . Migraines Brother   . Heart attack Brother 80  . Healthy Father   . Colon cancer Neg Hx   . Esophageal cancer Neg Hx   . Rectal cancer Neg Hx   . Stomach cancer Neg Hx   . Liver cancer Neg Hx   . Pancreatic cancer Neg Hx   . Other Neg Hx        low testosterone     Current  Outpatient Medications (Cardiovascular):  .  indapamide (LOZOL) 1.25 MG tablet, Take 1 tablet (1.25 mg total) by mouth daily. .  nebivolol (BYSTOLIC) 5 MG tablet, Take 1 tablet (5 mg total) by mouth daily. .  rosuvastatin (CRESTOR) 10 MG tablet, TAKE 1 TABLET BY MOUTH EVERY DAY .  sildenafil (REVATIO) 20 MG tablet, Take 3 tablets (60 mg total) by mouth daily as needed. .  tadalafil (CIALIS) 20 MG tablet, Take 1 tablet (20 mg total) by mouth daily as needed for erectile dysfunction.  Current Outpatient Medications (Respiratory):  .  albuterol (VENTOLIN HFA) 108 (90 Base) MCG/ACT inhaler, INHALE 2 PUFFS INTO THE LUNGS EVERY 6 HOURS AS NEEDED FOR WHEEZING OR SHORTNESS OF BREATH .  fluticasone (FLONASE) 50 MCG/ACT nasal spray, Place 1 spray into both nostrils daily.  Current Outpatient Medications (Analgesics):  .  meloxicam (MOBIC) 15 MG tablet, TAKE 1 TABLET EVERY DAY  Current Outpatient Medications (Hematological):  .  rivaroxaban (XARELTO) 10 MG TABS tablet, Take 1 tablet (10 mg total) by mouth daily.  Current Outpatient Medications (Other):  [name]  Armodafinil 250 MG tablet, Take 1 tablet (250 mg total) by mouth daily. .  Cholecalciferol 50 MCG (2000 UT) TABS, Take 1 tablet (2,000 Units total) by mouth daily. .  fluocinonide cream (LIDEX) 5.40 %, Apply 1 application topically 2 (two) times daily. [name]  KLOR-CON M20 20 MEQ tablet, TAKE 1 TABLET BY MOUTH TWICE A DAY .  methocarbamol (ROBAXIN) 500 MG tablet, TAKE 1 TABLET (500 MG TOTAL) BY MOUTH AT BEDTIME AS NEEDED FOR MUSCLE SPASMS. .  Multiple Vitamin (MULTIVITAMIN WITH MINERALS) TABS tablet, Take 1 tablet by mouth daily. [name]  omeprazole (PRILOSEC) 40 MG capsule, Take 1 capsule (40 mg total) by mouth 2 (two) times daily for 14 days. .  pantoprazole (PROTONIX) 40 MG tablet, Take 1 tablet (40 mg total) by mouth daily. [name]  tiZANidine (ZANAFLEX) 4 MG tablet, TAKE 1 TABLET (4 MG TOTAL) BY MOUTH AT BEDTIME. [name]  Vitamin D, Ergocalciferol, (DRISDOL) 1.25 MG  (50000 UNIT) CAPS capsule, TAKE 1 CAPSULE (50,000 UNITS TOTAL) BY MOUTH EVERY 7 (SEVEN) DAYS.   Reviewed prior external information including notes and imaging from  primary care provider As well as notes that were available from care everywhere and other healthcare systems.  Past medical history, social, surgical and family history all reviewed in electronic medical record.  No pertanent information unless stated regarding to the chief complaint.   Review of Systems:  No  headache, visual changes, nausea, vomiting, diarrhea, constipation, dizziness, abdominal pain, skin rash, fevers, chills, night sweats, weight loss, swollen lymph nodes, body aches, joint swelling, chest pain, shortness of breath, mood changes. POSITIVE muscle aches  Objective  There were no vitals taken for this visit.   General: No apparent distress alert and oriented x3 mood and affect normal, dressed appropriately.  HEENT: Pupils equal, extraocular movements intact  Respiratory: Patient's speak in full sentences and does not appear short of breath  Cardiovascular: No lower extremity edema, non tender, no erythema  Gait normal with good balance and coordination.  MSK:  Non tender with full range of motion and good stability and symmetric strength and tone of shoulders, elbows, wrist, hip, knee and ankles bilaterally.     Impression and Recommendations:     The above documentation has been reviewed and is accurate and complete Jacqualin Combes

## 2020-09-19 ENCOUNTER — Ambulatory Visit: Payer: BC Managed Care – PPO | Admitting: Family Medicine

## 2020-09-25 ENCOUNTER — Telehealth: Payer: Self-pay

## 2020-09-25 NOTE — Telephone Encounter (Signed)
Left message for patient that gel injections are not covered for visit tomorrow. If needs to reschedule encouraged to call back.

## 2020-09-25 NOTE — Progress Notes (Deleted)
[place] Phone: [phone] Subjective:    I'm seeing this patient by the request  of:  [name], MD  CC:   [id number]  JERMAN TINNON is a 55 y.o. male coming in with complaint of ***  Onset-  Location Duration-  Character- Aggravating factors- Reliving factors-  Therapies tried-  Severity-     Past Medical History:  Diagnosis Date  . Anxiety   . Arthritis    knees - no med  . Cluster headache    Hx - resolved per patient  . Depression   . DVT of lower extremity (deep venous thrombosis) ([place]) 6/26-28/2013   Xarelto - resolved - history  . HTN (hypertension)    currently no meds  . Irritability and anger    PMH of  . Kidney stone    passed stone, no surgery required.  . Rotator cuff arthropathy 2007   right  . Saddle pulmonary embolus ([place]) 6/26-28/2013   post orthopedic surgery - resolved  . Sleep apnea 5/15   moderate OSA-has cpap but does not use it   Past Surgical History:  Procedure Laterality Date  . arthroscopic knee surgery  10/09/2011   Dr Ninfa Linden  . CARDIAC CATHETERIZATION  2007   LVdysfunction; normal coronaries  . KNEE ARTHROSCOPY WITH MEDIAL MENISECTOMY Left 09/20/2014   medial menisectomy chondroplastytella medial plica excision  ;  Surgeon: Dorna Leitz, MD;  Location: [facility];  Service: Orthopedics;  Laterality: Left;  . ROTATOR CUFF REPAIR  2007   right  . UPPER GASTROINTESTINAL ENDOSCOPY  2007   normal  . WISDOM TOOTH EXTRACTION     Social History   Socioeconomic History  . Marital status: Married    Spouse name: Not on file  . Number of children: 6  . Years of education: 59  . Highest education level: Not on file  Occupational History  . Occupation: Glass blower/designer   . Occupation: Programmer, systems  Tobacco Use  . Smoking status: Former Smoker    Types: Cigars  . Smokeless tobacco: Never Used  . Tobacco comment: every 3-4  months  Vaping Use  . Vaping Use: Never used  Substance and Sexual Activity  . Alcohol use: Yes    Alcohol/week: 0.0 standard drinks    Comment: drinks occasionally  . Drug use: No  . Sexual activity: Yes    Birth control/protection: Condom  Other Topics Concern  . Not on file  Social History Narrative   Fun: Exercise   Social Determinants of Health   Financial Resource Strain: Not on file  Food Insecurity: Not on file  Transportation Needs: Not on file  Physical Activity: Not on file  Stress: Not on file  Social Connections: Not on file   Allergies  Allergen Reactions  . Tramadol Other (See Comments)    insomnia   Family History  Problem Relation Age of Onset  . Cancer Mother        Mesothelioma   . Diabetes Brother   . Hypertension Brother   . Stroke Brother 7  . Heart attack Brother 75  . Diabetes Maternal Aunt   . Diabetes Maternal Grandmother   . Heart disease Maternal Grandmother   . Hypertension Maternal Grandmother   . Stroke Maternal Grandmother        in 43s  . Deep vein thrombosis Maternal Grandmother   . Stroke Maternal Grandfather        >  42  . Migraines Brother   . Heart attack Brother 75  . Healthy Father   . Colon cancer Neg Hx   . Esophageal cancer Neg Hx   . Rectal cancer Neg Hx   . Stomach cancer Neg Hx   . Liver cancer Neg Hx   . Pancreatic cancer Neg Hx   . Other Neg Hx        low testosterone     Current Outpatient Medications (Cardiovascular):  .  indapamide (LOZOL) 1.25 MG tablet, Take 1 tablet (1.25 mg total) by mouth daily. .  nebivolol (BYSTOLIC) 5 MG tablet, Take 1 tablet (5 mg total) by mouth daily. .  rosuvastatin (CRESTOR) 10 MG tablet, TAKE 1 TABLET BY MOUTH EVERY DAY .  sildenafil (REVATIO) 20 MG tablet, Take 3 tablets (60 mg total) by mouth daily as needed. .  tadalafil (CIALIS) 20 MG tablet, Take 1 tablet (20 mg total) by mouth daily as needed for erectile dysfunction.  Current Outpatient Medications  (Respiratory):  .  albuterol (VENTOLIN HFA) 108 (90 Base) MCG/ACT inhaler, INHALE 2 PUFFS INTO THE LUNGS EVERY 6 HOURS AS NEEDED FOR WHEEZING OR SHORTNESS OF BREATH .  fluticasone (FLONASE) 50 MCG/ACT nasal spray, Place 1 spray into both nostrils daily.  Current Outpatient Medications (Analgesics):  .  meloxicam (MOBIC) 15 MG tablet, TAKE 1 TABLET EVERY DAY  Current Outpatient Medications (Hematological):  .  rivaroxaban (XARELTO) 10 MG TABS tablet, Take 1 tablet (10 mg total) by mouth daily.  Current Outpatient Medications (Other):  [name]  Armodafinil 250 MG tablet, Take 1 tablet (250 mg total) by mouth daily. .  Cholecalciferol 50 MCG (2000 UT) TABS, Take 1 tablet (2,000 Units total) by mouth daily. .  fluocinonide cream (LIDEX) 4.78 %, Apply 1 application topically 2 (two) times daily. [name]  KLOR-CON M20 20 MEQ tablet, TAKE 1 TABLET BY MOUTH TWICE A DAY .  methocarbamol (ROBAXIN) 500 MG tablet, TAKE 1 TABLET (500 MG TOTAL) BY MOUTH AT BEDTIME AS NEEDED FOR MUSCLE SPASMS. .  Multiple Vitamin (MULTIVITAMIN WITH MINERALS) TABS tablet, Take 1 tablet by mouth daily. [name]  omeprazole (PRILOSEC) 40 MG capsule, Take 1 capsule (40 mg total) by mouth 2 (two) times daily for 14 days. .  pantoprazole (PROTONIX) 40 MG tablet, Take 1 tablet (40 mg total) by mouth daily. [name]  tiZANidine (ZANAFLEX) 4 MG tablet, TAKE 1 TABLET (4 MG TOTAL) BY MOUTH AT BEDTIME. [name]  Vitamin D, Ergocalciferol, (DRISDOL) 1.25 MG (50000 UNIT) CAPS capsule, TAKE 1 CAPSULE (50,000 UNITS TOTAL) BY MOUTH EVERY 7 (SEVEN) DAYS.   Reviewed prior external information including notes and imaging from  primary care provider As well as notes that were available from care everywhere and other healthcare systems.  Past medical history, social, surgical and family history all reviewed in electronic medical record.  No pertanent information unless stated regarding to the chief complaint.   Review of Systems:  No headache, visual changes, nausea,  vomiting, diarrhea, constipation, dizziness, abdominal pain, skin rash, fevers, chills, night sweats, weight loss, swollen lymph nodes, body aches, joint swelling, chest pain, shortness of breath, mood changes. POSITIVE muscle aches  Objective  There were no vitals taken for this visit.   General: No apparent distress alert and oriented x3 mood and affect normal, dressed appropriately.  HEENT: Pupils equal, extraocular movements intact  Respiratory: Patient's speak in full sentences and does not appear short of breath  Cardiovascular: No lower extremity edema, non tender, no erythema  Gait normal  with good balance and coordination.  MSK:  Non tender with full range of motion and good stability and symmetric strength and tone of shoulders, elbows, wrist, hip, knee and ankles bilaterally.     Impression and Recommendations:     The above documentation has been reviewed and is accurate and complete Lyndal Pulley, DO

## 2020-09-26 ENCOUNTER — Ambulatory Visit: Payer: BC Managed Care – PPO | Admitting: Family Medicine

## 2020-10-01 ENCOUNTER — Other Ambulatory Visit: Payer: Self-pay

## 2020-10-01 ENCOUNTER — Other Ambulatory Visit: Payer: Self-pay | Admitting: Podiatry

## 2020-10-02 ENCOUNTER — Ambulatory Visit (INDEPENDENT_AMBULATORY_CARE_PROVIDER_SITE_OTHER): Payer: BC Managed Care – PPO | Admitting: Internal Medicine

## 2020-10-02 ENCOUNTER — Encounter: Payer: Self-pay | Admitting: Internal Medicine

## 2020-10-02 DIAGNOSIS — I10 Essential (primary) hypertension: Secondary | ICD-10-CM

## 2020-10-02 DIAGNOSIS — D6851 Activated protein C resistance: Secondary | ICD-10-CM | POA: Diagnosis not present

## 2020-10-02 MED ORDER — RIVAROXABAN 10 MG PO TABS: 10.0000 mg | ORAL_TABLET | Freq: Every day | ORAL | 1 refills | Status: DC

## 2020-10-02 MED ORDER — INDAPAMIDE 1.25 MG PO TABS
1.2500 mg | ORAL_TABLET | Freq: Every day | ORAL | 1 refills | Status: DC
Start: 2020-10-02 — End: 2021-01-22

## 2020-10-02 MED ORDER — NEBIVOLOL HCL 5 MG PO TABS: 5.0000 mg | ORAL_TABLET | Freq: Every day | ORAL | 1 refills | Status: DC

## 2020-10-02 NOTE — Progress Notes (Signed)
Subjective:  Patient ID: [name], male    DOB: [birth year]  Age: 55 y.o. MRN: [id number]  CC: Hypertension  This visit occurred during the SARS-CoV-2 public health emergency.  Safety protocols were in place, including screening questions prior to the visit, additional usage of staff PPE, and extensive cleaning of exam room while observing appropriate contact time as indicated for disinfecting solutions.    HPI HINTON LUELLEN presents for f/up -  He admits that he has not taken any antihypertensives for about 3 weeks.  He tells me he was seen about 3 weeks ago at the [facility] for evaluation of the sharp chest pain.  He says the work-up was unremarkable.  Labs were completed.  He will be seeing a cardiologist soon.  He has had no recurrent episodes of chest pain.  He denies headache, diaphoresis, or shortness of breath but he does complain of blurred vision.  Outpatient Medications Prior to Visit  Medication Sig Dispense Refill  . albuterol (VENTOLIN HFA) 108 (90 Base) MCG/ACT inhaler INHALE 2 PUFFS INTO THE LUNGS EVERY 6 HOURS AS NEEDED FOR WHEEZING OR SHORTNESS OF BREATH 25.5 g 1  . Armodafinil 250 MG tablet Take 1 tablet (250 mg total) by mouth daily. 90 tablet 1  . Cholecalciferol 50 MCG (2000 UT) TABS Take 1 tablet (2,000 Units total) by mouth daily. 90 tablet 1  . fluocinonide cream (LIDEX) 2.99 % Apply 1 application topically 2 (two) times daily. 60 g 2  . fluticasone (FLONASE) 50 MCG/ACT nasal spray Place 1 spray into both nostrils daily.    [name] KLOR-CON M20 20 MEQ tablet TAKE 1 TABLET BY MOUTH TWICE A DAY 180 tablet 0  . meloxicam (MOBIC) 15 MG tablet TAKE 1 TABLET EVERY DAY 30 tablet 2  . methocarbamol (ROBAXIN) 500 MG tablet TAKE 1 TABLET (500 MG TOTAL) BY MOUTH AT BEDTIME AS NEEDED FOR MUSCLE SPASMS. 30 tablet 1  . Multiple Vitamin (MULTIVITAMIN WITH MINERALS) TABS tablet Take 1 tablet by mouth daily.    . pantoprazole (PROTONIX) 40 MG tablet Take 1 tablet (40 mg  total) by mouth daily. 90 tablet 1  . rosuvastatin (CRESTOR) 10 MG tablet TAKE 1 TABLET BY MOUTH EVERY DAY 90 tablet 1  . sildenafil (REVATIO) 20 MG tablet Take 3 tablets (60 mg total) by mouth daily as needed. 60 tablet 5  . tadalafil (CIALIS) 20 MG tablet Take 1 tablet (20 mg total) by mouth daily as needed for erectile dysfunction. 6 tablet 5  . tiZANidine (ZANAFLEX) 4 MG tablet TAKE 1 TABLET (4 MG TOTAL) BY MOUTH AT BEDTIME. 90 tablet 1  . Vitamin D, Ergocalciferol, (DRISDOL) 1.25 MG (50000 UNIT) CAPS capsule TAKE 1 CAPSULE (50,000 UNITS TOTAL) BY MOUTH EVERY 7 (SEVEN) DAYS. 12 capsule 0  . indapamide (LOZOL) 1.25 MG tablet Take 1 tablet (1.25 mg total) by mouth daily. 90 tablet 1  . nebivolol (BYSTOLIC) 5 MG tablet Take 1 tablet (5 mg total) by mouth daily. 84 tablet 0  . rivaroxaban (XARELTO) 10 MG TABS tablet Take 1 tablet (10 mg total) by mouth daily. 90 tablet 1  . omeprazole (PRILOSEC) 40 MG capsule Take 1 capsule (40 mg total) by mouth 2 (two) times daily for 14 days. 28 capsule 0   No facility-administered medications prior to visit.    ROS Review of Systems  Constitutional: Negative for diaphoresis and fatigue.  HENT: Negative.   Eyes: Positive for visual disturbance.  Respiratory: Negative for cough, chest tightness, shortness  of breath and wheezing.   Cardiovascular: Negative for chest pain, palpitations and leg swelling.  Gastrointestinal: Negative for abdominal pain, diarrhea, nausea and vomiting.  Genitourinary: Negative.  Negative for difficulty urinating.  Musculoskeletal: Negative.  Negative for back pain and neck pain.  Skin: Negative.   Neurological: Negative.  Negative for dizziness, weakness, light-headedness and headaches.  Hematological: Negative for adenopathy. Does not bruise/bleed easily.  Psychiatric/Behavioral: Negative.     Objective:  BP (!) 160/94 (BP Location: Left Arm, Patient Position: Sitting, Cuff Size: Large)   Pulse 95   Temp 97.9 F (36.6  C) (Oral)   Resp 16   Ht 5\' 7"  (1.702 m)   Wt 275 lb (124.7 kg)   SpO2 98%   BMI 43.07 kg/m   BP Readings from Last 3 Encounters:  10/02/20 (!) 160/94  09/04/20 (!) 149/92  08/09/20 (!) 128/98    Wt Readings from Last 3 Encounters:  10/02/20 275 lb (124.7 kg)  08/09/20 272 lb (123.4 kg)  05/29/20 272 lb 12.8 oz (123.7 kg)    Physical Exam Vitals reviewed.  HENT:     Nose: Nose normal.     Mouth/Throat:     Mouth: Mucous membranes are moist.  Eyes:     General: No scleral icterus.    Conjunctiva/sclera: Conjunctivae normal.  Cardiovascular:     Rate and Rhythm: Normal rate and regular rhythm.     Heart sounds: No murmur heard.   Pulmonary:     Effort: Pulmonary effort is normal.     Breath sounds: No stridor. No wheezing, rhonchi or rales.  Abdominal:     General: Abdomen is flat.     Palpations: There is no mass.     Tenderness: There is no abdominal tenderness. There is no guarding.  Musculoskeletal:        General: Normal range of motion.     Cervical back: Neck supple.     Right lower leg: No edema.     Left lower leg: No edema.  Lymphadenopathy:     Cervical: No cervical adenopathy.  Skin:    General: Skin is warm and dry.  Neurological:     General: No focal deficit present.     Mental Status: He is alert.     Lab Results  Component Value Date   WBC 10.0 09/04/2020   HGB 14.0 09/04/2020   HCT 43.4 09/04/2020   PLT 226 09/04/2020   GLUCOSE 114 (H) 09/04/2020   CHOL 125 09/04/2020   TRIG 79 09/04/2020   HDL 52 09/04/2020   LDLCALC 57 09/04/2020   ALT 27 09/04/2020   AST 19 09/04/2020   NA 137 09/04/2020   K 3.9 09/04/2020   CL 104 09/04/2020   CREATININE 0.85 09/04/2020   BUN 8 09/04/2020   CO2 27 09/04/2020   TSH 1.48 09/04/2020   PSA 0.3 12/22/2019   INR CANCELED 08/12/2013   HGBA1C 5.7 (H) 12/22/2019    DG Chest 2 View  Result Date: 09/04/2020 CLINICAL DATA:  Chest pain and shortness of breath EXAM: CHEST - 2 VIEW COMPARISON:   February 01, 2019 chest radiograph; chest CT April 22, 2019 FINDINGS: Lungs are clear. Heart size and pulmonary vascularity are normal. No adenopathy. No pneumothorax. No bone lesions. IMPRESSION: Lungs clear.  Cardiac silhouette normal. Electronically Signed   By: Lowella Grip III M.D.   On: 09/04/2020 13:30   CT Angio Chest PE W/Cm &/Or Wo Cm  Result Date: 09/04/2020 CLINICAL DATA:  Shortness  of breath, chest pain. EXAM: CT ANGIOGRAPHY CHEST WITH CONTRAST TECHNIQUE: Multidetector CT imaging of the chest was performed using the standard protocol during bolus administration of intravenous contrast. Multiplanar CT image reconstructions and MIPs were obtained to evaluate the vascular anatomy. CONTRAST:  170mL OMNIPAQUE IOHEXOL 350 MG/ML SOLN COMPARISON:  April 22, 2019. FINDINGS: Cardiovascular: Satisfactory opacification of the pulmonary arteries to the segmental level. No evidence of pulmonary embolism. Normal heart size. No pericardial effusion. Mediastinum/Nodes: No enlarged mediastinal, hilar, or axillary lymph nodes. Thyroid gland, trachea, and esophagus demonstrate no significant findings. Lungs/Pleura: Lungs are clear. No pleural effusion or pneumothorax. Upper Abdomen: No acute abnormality. Musculoskeletal: No chest wall abnormality. No acute or significant osseous findings. Review of the MIP images confirms the above findings. IMPRESSION: No definite evidence of pulmonary embolus. No acute abnormality seen in the chest. Electronically Signed   By: Marijo Conception M.D.   On: 09/04/2020 16:35    Assessment & Plan:   Mostyn was seen today for hypertension.  Diagnoses and all orders for this visit:  Essential hypertension- His blood pressure is not adequately well controlled and he is symptomatic.  I recommended that he take nebivolol and indapamide as directed. -     nebivolol (BYSTOLIC) 5 MG tablet; Take 1 tablet (5 mg total) by mouth daily. -     indapamide (LOZOL) 1.25 MG tablet;  Take 1 tablet (1.25 mg total) by mouth daily.  Heterozygous factor V Leiden mutation ([place])- I recommended that he take an anticoagulant. -     rivaroxaban (XARELTO) 10 MG TABS tablet; Take 1 tablet (10 mg total) by mouth daily.   I am having Race Latour. Wanninger maintain his multivitamin with minerals, fluticasone, Cholecalciferol, albuterol, Vitamin D (Ergocalciferol), pantoprazole, omeprazole, sildenafil, Armodafinil, fluocinonide cream, tiZANidine, methocarbamol, rosuvastatin, [place]-Con M20, tadalafil, meloxicam, nebivolol, rivaroxaban, and indapamide.  Meds ordered this encounter  Medications  . nebivolol (BYSTOLIC) 5 MG tablet    Sig: Take 1 tablet (5 mg total) by mouth daily.    Dispense:  90 tablet    Refill:  1  . rivaroxaban (XARELTO) 10 MG TABS tablet    Sig: Take 1 tablet (10 mg total) by mouth daily.    Dispense:  90 tablet    Refill:  1  . indapamide (LOZOL) 1.25 MG tablet    Sig: Take 1 tablet (1.25 mg total) by mouth daily.    Dispense:  90 tablet    Refill:  1     Follow-up: Return in about 6 weeks (around 11/13/2020).  Scarlette Calico, MD

## 2020-10-02 NOTE — Patient Instructions (Signed)

## 2020-10-25 DIAGNOSIS — G4733 Obstructive sleep apnea (adult) (pediatric): Secondary | ICD-10-CM | POA: Diagnosis not present

## 2020-11-07 NOTE — Progress Notes (Signed)
[facility] [address] [place] Phone: [phone] Subjective:   I [name] am serving as a Education administrator for Dr. Hulan Saas.  This visit occurred during the SARS-CoV-2 public health emergency.  Safety protocols were in place, including screening questions prior to the visit, additional usage of staff PPE, and extensive cleaning of exam room while observing appropriate contact time as indicated for disinfecting solutions.   I'm seeing this patient by the request  of:  [name], MD  CC: Bilateral knee pain  [id number]   08/09/2020 Patient did not respond as well to injection from another provider.  We will consider repeat in 6 weeks if necessary.  Patient has seen another physician for this at this time.  Was to get injections but unfortunately was denied recently.  They are working on it at the moment.  Otherwise patient may need to consider surgical intervention.  We did discuss with patient having multiple polyarthralgias we could consider labs but patient would like to hold at this time.  Repeat injection again  Worsening pain.  He does have known moderate arthritic changes.  Has responded somewhat to the viscosupplementation and we may consider approved.  Patient will follow up with me again 6 weeks  Update 11/08/2020 YOHANN CURL is a 55 y.o. male coming in with complaint of back, B knees, and R AC joint pain. Patient states both are bothering him and he would like injections. States his pain is worse than last time. Did not go to work today and would like a work note.  Patient has known moderate arthritic changes of the knees bilaterally.  Patient also known to have severe right-sided AC joint arthritis.      Past Medical History:  Diagnosis Date   Anxiety    Arthritis    knees - no med   Cluster headache    Hx - resolved per patient   Depression    DVT of lower extremity (deep venous thrombosis) ([place])  6/26-28/2013   Xarelto - resolved - history   HTN (hypertension)    currently no meds   Irritability and anger    PMH of   Kidney stone    passed stone, no surgery required.   Rotator cuff arthropathy 2007   right   Saddle pulmonary embolus ([place]) 6/26-28/2013   post orthopedic surgery - resolved   Sleep apnea 5/15   moderate OSA-has cpap but does not use it   Past Surgical History:  Procedure Laterality Date   arthroscopic knee surgery  10/09/2011   Dr Ninfa Linden   CARDIAC CATHETERIZATION  2007   LVdysfunction; normal coronaries   KNEE ARTHROSCOPY WITH MEDIAL MENISECTOMY Left 09/20/2014   medial menisectomy chondroplastytella medial plica excision  ;  Surgeon: Dorna Leitz, MD;  Location: [facility];  Service: Orthopedics;  Laterality: Left;   ROTATOR CUFF REPAIR  2007   right   UPPER GASTROINTESTINAL ENDOSCOPY  2007   normal   WISDOM TOOTH EXTRACTION     Social History   Socioeconomic History   Marital status: Married    Spouse name: Not on file   Number of children: 6   Years of education: 11   Highest education level: Not on file  Occupational History   Occupation: Glass blower/designer    Occupation: Programmer, systems  Tobacco Use   Smoking status: Former    Pack years: 0.00    Types: Cigars   Smokeless tobacco: Never  Tobacco comments:    every 3-4 months  Vaping Use   Vaping Use: Never used  Substance and Sexual Activity   Alcohol use: Yes    Alcohol/week: 0.0 standard drinks    Comment: drinks occasionally   Drug use: No   Sexual activity: Yes    Birth control/protection: Condom  Other Topics Concern   Not on file  Social History Narrative   Fun: Exercise   Social Determinants of Health   Financial Resource Strain: Not on file  Food Insecurity: Not on file  Transportation Needs: Not on file  Physical Activity: Not on file  Stress: Not on file  Social Connections: Not on file   Allergies  Allergen Reactions   Tramadol Other (See  Comments)    insomnia   Family History  Problem Relation Age of Onset   Cancer Mother        Mesothelioma    Diabetes Brother    Hypertension Brother    Stroke Brother 57   Heart attack Brother 27   Diabetes Maternal Aunt    Diabetes Maternal Grandmother    Heart disease Maternal Grandmother    Hypertension Maternal Grandmother    Stroke Maternal Grandmother        in 60s   Deep vein thrombosis Maternal Grandmother    Stroke Maternal Grandfather        > 82   Migraines Brother    Heart attack Brother 23   Healthy Father    Colon cancer Neg Hx    Esophageal cancer Neg Hx    Rectal cancer Neg Hx    Stomach cancer Neg Hx    Liver cancer Neg Hx    Pancreatic cancer Neg Hx    Other Neg Hx        low testosterone     Current Outpatient Medications (Cardiovascular):    indapamide (LOZOL) 1.25 MG tablet, Take 1 tablet (1.25 mg total) by mouth daily.   nebivolol (BYSTOLIC) 5 MG tablet, Take 1 tablet (5 mg total) by mouth daily.   rosuvastatin (CRESTOR) 10 MG tablet, TAKE 1 TABLET BY MOUTH EVERY DAY   sildenafil (REVATIO) 20 MG tablet, Take 3 tablets (60 mg total) by mouth daily as needed.   tadalafil (CIALIS) 20 MG tablet, Take 1 tablet (20 mg total) by mouth daily as needed for erectile dysfunction.  Current Outpatient Medications (Respiratory):    albuterol (VENTOLIN HFA) 108 (90 Base) MCG/ACT inhaler, INHALE 2 PUFFS INTO THE LUNGS EVERY 6 HOURS AS NEEDED FOR WHEEZING OR SHORTNESS OF BREATH   fluticasone (FLONASE) 50 MCG/ACT nasal spray, Place 1 spray into both nostrils daily.  Current Outpatient Medications (Analgesics):    meloxicam (MOBIC) 15 MG tablet, TAKE 1 TABLET EVERY DAY  Current Outpatient Medications (Hematological):    rivaroxaban (XARELTO) 10 MG TABS tablet, Take 1 tablet (10 mg total) by mouth daily.  Current Outpatient Medications (Other):    Armodafinil 250 MG tablet, Take 1 tablet (250 mg total) by mouth daily.   Cholecalciferol 50 MCG (2000 UT) TABS,  Take 1 tablet (2,000 Units total) by mouth daily.   fluocinonide cream (LIDEX) 0.25 %, Apply 1 application topically 2 (two) times daily.   KLOR-CON M20 20 MEQ tablet, TAKE 1 TABLET BY MOUTH TWICE A DAY   methocarbamol (ROBAXIN) 500 MG tablet, TAKE 1 TABLET (500 MG TOTAL) BY MOUTH AT BEDTIME AS NEEDED FOR MUSCLE SPASMS.   Multiple Vitamin (MULTIVITAMIN WITH MINERALS) TABS tablet, Take 1 tablet by mouth daily.  pantoprazole (PROTONIX) 40 MG tablet, Take 1 tablet (40 mg total) by mouth daily.   tiZANidine (ZANAFLEX) 4 MG tablet, TAKE 1 TABLET (4 MG TOTAL) BY MOUTH AT BEDTIME.   Vitamin D, Ergocalciferol, (DRISDOL) 1.25 MG (50000 UNIT) CAPS capsule, TAKE 1 CAPSULE (50,000 UNITS TOTAL) BY MOUTH EVERY 7 (SEVEN) DAYS.   omeprazole (PRILOSEC) 40 MG capsule, Take 1 capsule (40 mg total) by mouth 2 (two) times daily for 14 days.   Reviewed prior external information including notes and imaging from  primary care provider As well as notes that were available from care everywhere and other healthcare systems.  Past medical history, social, surgical and family history all reviewed in electronic medical record.  No pertanent information unless stated regarding to the chief complaint.   Review of Systems:  No headache, visual changes, nausea, vomiting, diarrhea, constipation, dizziness, abdominal pain, skin rash, fevers, chills, night sweats, weight loss, swollen lymph nodes,  chest pain, shortness of breath, mood changes. POSITIVE muscle aches, body aches, joint swelling   Objective  Blood pressure (!) 142/90, pulse 72, height 5\' 7"  (1.702 m), weight 279 lb (126.6 kg), SpO2 97 %.   General: No apparent distress alert and oriented x3 mood and affect normal, dressed appropriately.  HEENT: Pupils equal, extraocular movements intact  Respiratory: Patient's speak in full sentences and does not appear short of breath  Cardiovascular: No lower extremity edema, non tender, no erythema  Gait normal with good  balance and coordination.  MSK: Right shoulder exam shows positive crossover noted.  Severe tenderness over the acromioclavicular joint.  Bilateral knee exam show the patient does have significant arthritic changes with medial joint tenderness.  Patient does have some limited range of motion bilaterally.  After informed written and verbal consent, patient was seated on exam table. Right knee was prepped with alcohol swab and utilizing anterolateral approach, patient's right knee space was injected with 4:1  marcaine 0.5%: Kenalog 40mg /dL. Patient tolerated the procedure well without immediate complications.  After informed written and verbal consent, patient was seated on exam table. Left knee was prepped with alcohol swab and utilizing anterolateral approach, patient's left knee space was injected with 4:1  marcaine 0.5%: Kenalog 40mg /dL. Patient tolerated the procedure well without immediate complications.  Procedure: Real-time Ultrasound Guided Injection of right acromioclavicular joint Device: GE Logiq Q7 Ultrasound guided injection is preferred based studies that show increased duration, increased effect, greater accuracy, decreased procedural pain, increased response rate, and decreased cost with ultrasound guided versus blind injection.  Verbal informed consent obtained.  Time-out conducted.  Noted no overlying erythema, induration, or other signs of local infection.  Skin prepped in a sterile fashion.  Local anesthesia: Topical Ethyl chloride.  With sterile technique and under real time ultrasound guidance: With a 25-gauge 1 inch needle injected with 0.5 cc of 0.5% Marcaine and 0.5 cc of Kenalog 40 mg per Completed without difficulty  Pain immediately resolved suggesting accurate placement of the medication.  Advised to call if fevers/chills, erythema, induration, drainage, or persistent bleeding.  Impression: Technically successful ultrasound guided injection.    Impression and  Recommendations:     The above documentation has been reviewed and is accurate and complete Lyndal Pulley, DO

## 2020-11-08 ENCOUNTER — Encounter: Payer: Self-pay | Admitting: Family Medicine

## 2020-11-08 ENCOUNTER — Ambulatory Visit (INDEPENDENT_AMBULATORY_CARE_PROVIDER_SITE_OTHER): Payer: BC Managed Care – PPO | Admitting: Family Medicine

## 2020-11-08 ENCOUNTER — Other Ambulatory Visit: Payer: Self-pay

## 2020-11-08 ENCOUNTER — Ambulatory Visit: Payer: Self-pay

## 2020-11-08 VITALS — BP 142/90 | HR 72 | Ht 67.0 in | Wt 279.0 lb

## 2020-11-08 DIAGNOSIS — G8929 Other chronic pain: Secondary | ICD-10-CM

## 2020-11-08 DIAGNOSIS — M19011 Primary osteoarthritis, right shoulder: Secondary | ICD-10-CM

## 2020-11-08 DIAGNOSIS — M170 Bilateral primary osteoarthritis of knee: Secondary | ICD-10-CM | POA: Diagnosis not present

## 2020-11-08 DIAGNOSIS — M25511 Pain in right shoulder: Secondary | ICD-10-CM | POA: Diagnosis not present

## 2020-11-08 NOTE — Patient Instructions (Addendum)
Good to see you Injected the knees and shoulder Will get the gel approved See me again in 3 months we can inject again

## 2020-11-08 NOTE — Assessment & Plan Note (Signed)
Patient given another injection today.  Tolerated the procedure well.  Hoping that this will be continued to make some improvement.  He does have severe arthritic changes.  Follow-up with me again in 2 to 3 months

## 2020-11-08 NOTE — Assessment & Plan Note (Signed)
Patient given repeat injection.  Patient is chronic pain with exacerbation.  We will see if we can get approval for gel injections again which patient has responded to in the past but has had denial from insurance previously.  We will see if we can get approval.  Continue to work on the weight at this point.  Discussed wanting to get patient's BMI under 40.  Increase activity slowly.  Follow-up with me again in 2 to 3 months

## 2020-11-13 ENCOUNTER — Ambulatory Visit (INDEPENDENT_AMBULATORY_CARE_PROVIDER_SITE_OTHER): Payer: BC Managed Care – PPO | Admitting: Internal Medicine

## 2020-11-13 ENCOUNTER — Other Ambulatory Visit: Payer: Self-pay

## 2020-11-13 ENCOUNTER — Encounter: Payer: Self-pay | Admitting: Internal Medicine

## 2020-11-13 VITALS — BP 130/86 | HR 78 | Temp 98.3°F | Ht 67.0 in | Wt 276.0 lb

## 2020-11-13 DIAGNOSIS — K635 Polyp of colon: Secondary | ICD-10-CM | POA: Insufficient documentation

## 2020-11-13 DIAGNOSIS — E876 Hypokalemia: Secondary | ICD-10-CM

## 2020-11-13 DIAGNOSIS — I10 Essential (primary) hypertension: Secondary | ICD-10-CM

## 2020-11-13 DIAGNOSIS — J4530 Mild persistent asthma, uncomplicated: Secondary | ICD-10-CM | POA: Insufficient documentation

## 2020-11-13 DIAGNOSIS — M170 Bilateral primary osteoarthritis of knee: Secondary | ICD-10-CM | POA: Diagnosis not present

## 2020-11-13 DIAGNOSIS — J4520 Mild intermittent asthma, uncomplicated: Secondary | ICD-10-CM | POA: Diagnosis not present

## 2020-11-13 DIAGNOSIS — Z23 Encounter for immunization: Secondary | ICD-10-CM

## 2020-11-13 DIAGNOSIS — T502X5A Adverse effect of carbonic-anhydrase inhibitors, benzothiadiazides and other diuretics, initial encounter: Secondary | ICD-10-CM

## 2020-11-13 HISTORY — DX: Mild intermittent asthma, uncomplicated: J45.20

## 2020-11-13 MED ORDER — TRELEGY ELLIPTA 100-62.5-25 MCG/INH IN AEPB: 1.0000 | INHALATION_SPRAY | Freq: Every day | RESPIRATORY_TRACT | 1 refills | Status: DC

## 2020-11-13 MED ORDER — ALBUTEROL SULFATE HFA 108 (90 BASE) MCG/ACT IN AERS: 1.0000 | INHALATION_SPRAY | Freq: Four times a day (QID) | RESPIRATORY_TRACT | 3 refills | Status: DC | PRN

## 2020-11-13 MED ORDER — MELOXICAM 15 MG PO TABS: 1.0000 | ORAL_TABLET | Freq: Every day | ORAL | 0 refills | Status: DC

## 2020-11-13 MED ORDER — POTASSIUM CHLORIDE CRYS ER 20 MEQ PO TBCR: 20.0000 meq | EXTENDED_RELEASE_TABLET | Freq: Two times a day (BID) | ORAL | 0 refills | Status: DC

## 2020-11-13 NOTE — Progress Notes (Signed)
[facility]   Subjective:  Patient ID: [name], male    DOB: 01-06-1966  Age: 55 y.o. MRN: [id number]  CC: Hypertension and Asthma  This visit occurred during the SARS-CoV-2 public health emergency.  Safety protocols were in place, including screening questions prior to the visit, additional usage of staff PPE, and extensive cleaning of exam room while observing appropriate contact time as indicated for disinfecting solutions.    HPI [name] presents for f/up -   He complains of a 5-month history of what he describes as asthma symptoms.  He has a cough that is mostly productive of clear phlegm, occasionally yellow.  He has shortness of breath and wheezing.  He denies chest pain, hemoptysis, night sweats, fever, or chills.  He is getting some symptom relief with albuterol.  Outpatient Medications Prior to Visit  Medication Sig Dispense Refill   Armodafinil 250 MG tablet Take 1 tablet (250 mg total) by mouth daily. 90 tablet 1   Cholecalciferol 50 MCG (2000 UT) TABS Take 1 tablet (2,000 Units total) by mouth daily. 90 tablet 1   fluocinonide cream (LIDEX) 9.16 % Apply 1 application topically 2 (two) times daily. 60 g 2   fluticasone (FLONASE) 50 MCG/ACT nasal spray Place 1 spray into both nostrils daily.     indapamide (LOZOL) 1.25 MG tablet Take 1 tablet (1.25 mg total) by mouth daily. 90 tablet 1   Multiple Vitamin (MULTIVITAMIN WITH MINERALS) TABS tablet Take 1 tablet by mouth daily.     nebivolol (BYSTOLIC) 5 MG tablet Take 1 tablet (5 mg total) by mouth daily. 90 tablet 1   pantoprazole (PROTONIX) 40 MG tablet Take 1 tablet (40 mg total) by mouth daily. 90 tablet 1   rivaroxaban (XARELTO) 10 MG TABS tablet Take 1 tablet (10 mg total) by mouth daily. 90 tablet 1   rosuvastatin (CRESTOR) 10 MG tablet TAKE 1 TABLET BY MOUTH EVERY DAY 90 tablet 1   sildenafil (REVATIO) 20 MG tablet Take 3 tablets (60 mg total) by mouth daily as needed. 60 tablet 5   tadalafil (CIALIS) 20 MG  tablet Take 1 tablet (20 mg total) by mouth daily as needed for erectile dysfunction. 6 tablet 5   Vitamin D, Ergocalciferol, (DRISDOL) 1.25 MG (50000 UNIT) CAPS capsule TAKE 1 CAPSULE (50,000 UNITS TOTAL) BY MOUTH EVERY 7 (SEVEN) DAYS. 12 capsule 0   albuterol (VENTOLIN HFA) 108 (90 Base) MCG/ACT inhaler INHALE 2 PUFFS INTO THE LUNGS EVERY 6 HOURS AS NEEDED FOR WHEEZING OR SHORTNESS OF BREATH 25.5 g 1   KLOR-CON M20 20 MEQ tablet TAKE 1 TABLET BY MOUTH TWICE A DAY 180 tablet 0   meloxicam (MOBIC) 15 MG tablet TAKE 1 TABLET EVERY DAY 30 tablet 2   methocarbamol (ROBAXIN) 500 MG tablet TAKE 1 TABLET (500 MG TOTAL) BY MOUTH AT BEDTIME AS NEEDED FOR MUSCLE SPASMS. 30 tablet 1   tiZANidine (ZANAFLEX) 4 MG tablet TAKE 1 TABLET (4 MG TOTAL) BY MOUTH AT BEDTIME. 90 tablet 1   omeprazole (PRILOSEC) 40 MG capsule Take 1 capsule (40 mg total) by mouth 2 (two) times daily for 14 days. 28 capsule 0   No facility-administered medications prior to visit.    ROS Review of Systems  Constitutional:  Negative for chills, diaphoresis, fatigue and fever.  HENT: Negative.    Eyes: Negative.   Respiratory:  Positive for cough, shortness of breath and wheezing. Negative for chest tightness and stridor.   Cardiovascular:  Negative for chest pain, palpitations  and leg swelling.  Gastrointestinal:  Negative for abdominal pain, constipation, diarrhea, nausea and vomiting.  Endocrine: Negative.   Genitourinary: Negative.  Negative for difficulty urinating.  Musculoskeletal:  Positive for arthralgias. Negative for myalgias.  Skin: Negative.  Negative for color change.  Neurological: Negative.  Negative for dizziness, weakness, light-headedness and headaches.  Hematological:  Negative for adenopathy. Does not bruise/bleed easily.  Psychiatric/Behavioral: Negative.     Objective:  BP 130/86 (BP Location: Left Arm, Patient Position: Sitting, Cuff Size: Large)   Pulse 78   Temp 98.3 F (36.8 C) (Oral)   Ht 5\' 7"   (1.702 m)   Wt 276 lb (125.2 kg)   SpO2 95%   BMI 43.23 kg/m   BP Readings from Last 3 Encounters:  11/13/20 130/86  11/08/20 (!) 142/90  10/02/20 (!) 160/94    Wt Readings from Last 3 Encounters:  11/13/20 276 lb (125.2 kg)  11/08/20 279 lb (126.6 kg)  10/02/20 275 lb (124.7 kg)    Physical Exam Vitals reviewed.  Constitutional:      Appearance: Normal appearance.  HENT:     Nose: Nose normal.     Mouth/Throat:     Mouth: Mucous membranes are moist.  Eyes:     General: No scleral icterus.    Conjunctiva/sclera: Conjunctivae normal.  Cardiovascular:     Rate and Rhythm: Normal rate and regular rhythm.     Heart sounds: No murmur heard. Pulmonary:     Effort: Pulmonary effort is normal.     Breath sounds: No stridor. No wheezing, rhonchi or rales.  Abdominal:     General: Abdomen is flat. There is no distension.     Palpations: There is no mass.     Tenderness: There is no abdominal tenderness. There is no guarding.     Hernia: No hernia is present.  Musculoskeletal:        General: Normal range of motion.     Cervical back: Neck supple.     Right lower leg: No edema.     Left lower leg: No edema.  Lymphadenopathy:     Cervical: No cervical adenopathy.  Skin:    General: Skin is warm and dry.  Neurological:     General: No focal deficit present.     Mental Status: He is alert.    Lab Results  Component Value Date   WBC 10.0 09/04/2020   HGB 14.0 09/04/2020   HCT 43.4 09/04/2020   PLT 226 09/04/2020   GLUCOSE 114 (H) 09/04/2020   CHOL 125 09/04/2020   TRIG 79 09/04/2020   HDL 52 09/04/2020   LDLCALC 57 09/04/2020   ALT 27 09/04/2020   AST 19 09/04/2020   NA 137 09/04/2020   K 3.9 09/04/2020   CL 104 09/04/2020   CREATININE 0.85 09/04/2020   BUN 8 09/04/2020   CO2 27 09/04/2020   TSH 1.48 09/04/2020   PSA 0.3 12/22/2019   INR CANCELED 08/12/2013   HGBA1C 5.7 (H) 12/22/2019    DG Chest 2 View  Result Date: 09/04/2020 CLINICAL DATA:  Chest  pain and shortness of breath EXAM: CHEST - 2 VIEW COMPARISON:  February 01, 2019 chest radiograph; chest CT April 22, 2019 FINDINGS: Lungs are clear. Heart size and pulmonary vascularity are normal. No adenopathy. No pneumothorax. No bone lesions. IMPRESSION: Lungs clear.  Cardiac silhouette normal. Electronically Signed   By: Lowella Grip III M.D.   On: 09/04/2020 13:30   CT Angio Chest PE W/Cm &/Or Wo  Cm  Result Date: 09/04/2020 CLINICAL DATA:  Shortness of breath, chest pain. EXAM: CT ANGIOGRAPHY CHEST WITH CONTRAST TECHNIQUE: Multidetector CT imaging of the chest was performed using the standard protocol during bolus administration of intravenous contrast. Multiplanar CT image reconstructions and MIPs were obtained to evaluate the vascular anatomy. CONTRAST:  183mL OMNIPAQUE IOHEXOL 350 MG/ML SOLN COMPARISON:  April 22, 2019. FINDINGS: Cardiovascular: Satisfactory opacification of the pulmonary arteries to the segmental level. No evidence of pulmonary embolism. Normal heart size. No pericardial effusion. Mediastinum/Nodes: No enlarged mediastinal, hilar, or axillary lymph nodes. Thyroid gland, trachea, and esophagus demonstrate no significant findings. Lungs/Pleura: Lungs are clear. No pleural effusion or pneumothorax. Upper Abdomen: No acute abnormality. Musculoskeletal: No chest wall abnormality. No acute or significant osseous findings. Review of the MIP images confirms the above findings. IMPRESSION: No definite evidence of pulmonary embolus. No acute abnormality seen in the chest. Electronically Signed   By: Marijo Conception M.D.   On: 09/04/2020 16:35    Assessment & Plan:   Jabre was seen today for hypertension and asthma.  Diagnoses and all orders for this visit:  Primary osteoarthritis of both knees -     meloxicam (MOBIC) 15 MG tablet; Take 1 tablet (15 mg total) by mouth daily.  Essential hypertension- His blood pressure is adequately well controlled. -     potassium  chloride SA (KLOR-CON M20) 20 MEQ tablet; Take 1 tablet (20 mEq total) by mouth 2 (two) times daily.  Diuretic-induced hypokalemia -     potassium chloride SA (KLOR-CON M20) 20 MEQ tablet; Take 1 tablet (20 mEq total) by mouth 2 (two) times daily.  Mild intermittent asthma without complication- I recommended that he treat this with an ICS/LAMA/LABA inhaler. -     TRELEGY ELLIPTA 100-62.5-25 MCG/INH AEPB; Inhale 1 puff into the lungs daily. -     albuterol (VENTOLIN HFA) 108 (90 Base) MCG/ACT inhaler; Inhale 1-2 puffs into the lungs every 6 (six) hours as needed for wheezing or shortness of breath.  Polyp of colon, unspecified part of colon, unspecified type -     Ambulatory referral to Gastroenterology  Need for vaccination -     Pneumococcal conjugate vaccine 20-valent (Prevnar 20)  I have discontinued Juanda Bond. Polsky's tiZANidine and methocarbamol. I have changed his Klor-Con M20 to potassium chloride SA. I have also changed his meloxicam and albuterol. Additionally, I am having him start on Trelegy Ellipta. Lastly, I am having him maintain his multivitamin with minerals, fluticasone, Cholecalciferol, Vitamin D (Ergocalciferol), pantoprazole, omeprazole, sildenafil, Armodafinil, fluocinonide cream, rosuvastatin, tadalafil, nebivolol, rivaroxaban, and indapamide.  Meds ordered this encounter  Medications   meloxicam (MOBIC) 15 MG tablet    Sig: Take 1 tablet (15 mg total) by mouth daily.    Dispense:  90 tablet    Refill:  0   potassium chloride SA (KLOR-CON M20) 20 MEQ tablet    Sig: Take 1 tablet (20 mEq total) by mouth 2 (two) times daily.    Dispense:  180 tablet    Refill:  0   TRELEGY ELLIPTA 100-62.5-25 MCG/INH AEPB    Sig: Inhale 1 puff into the lungs daily.    Dispense:  3 each    Refill:  1   albuterol (VENTOLIN HFA) 108 (90 Base) MCG/ACT inhaler    Sig: Inhale 1-2 puffs into the lungs every 6 (six) hours as needed for wheezing or shortness of breath.    Dispense:   25.5 g    Refill:  3    **  Patient requests 90 days supply**     Follow-up: Return in about 3 months (around 02/13/2021).  Scarlette Calico, MD

## 2020-11-13 NOTE — Patient Instructions (Signed)
http://www.aaaai.org/conditions-and-treatments/asthma">  Asthma, Adult  Asthma is a long-term (chronic) condition that causes recurrent episodes in which the airways become tight and narrow. The airways are the passages that lead from the nose and mouth down into the lungs. Asthma episodes, also called asthma attacks, can cause coughing, wheezing, shortness of breath, and chest pain. The airways can also fill with mucus. During an attack, it can be difficult to breathe. Asthmaattacks can range from minor to life threatening. Asthma cannot be cured, but medicines and lifestyle changes can help control itand treat acute attacks. What are the causes? This condition is believed to be caused by inherited (genetic) and environmental factors, but its exact cause is not known. There are many things that can bring on an asthma attack or make asthma symptoms worse (triggers). Asthma triggers are different for each person. Common triggers include: Mold. Dust. Cigarette smoke. Cockroaches. Things that can cause allergy symptoms (allergens), such as animal dander or pollen from trees or grass. Air pollutants such as household cleaners, wood smoke, smog, or Advertising account planner. Cold air, weather changes, and winds (which increase molds and pollen in the air). Strong emotional expressions such as crying or laughing hard. Stress. Certain medicines (such as aspirin) or types of medicines (such as beta-blockers). Sulfites in foods and drinks. Foods and drinks that may contain sulfites include dried fruit, potato chips, and sparkling grape juice. Infections or inflammatory conditions such as the flu, a cold, or inflammation of the nasal membranes (rhinitis). Gastroesophageal reflux disease (GERD). Exercise or strenuous activity. What are the signs or symptoms? Symptoms of this condition may occur right after asthma is triggered or many hours later. Symptoms include: Wheezing. This can sound like whistling when you  breathe. Excessive nighttime or early morning coughing. Frequent or severe coughing with a common cold. Chest tightness. Shortness of breath. Tiredness (fatigue) with minimal activity. How is this diagnosed? This condition is diagnosed based on: Your medical history. A physical exam. Tests, which may include: Lung function studies and pulmonary studies (spirometry). These tests can evaluate the flow of air in your lungs. Allergy tests. Imaging tests, such as X-rays. How is this treated? There is no cure for this condition, but treatment can help control your symptoms. Treatment for asthma usually involves: Identifying and avoiding your asthma triggers. Using medicines to control your symptoms. Generally, two types of medicines are used to treat asthma: Controller medicines. These help prevent asthma symptoms from occurring. They are usually taken every day. Fast-acting reliever or rescue medicines. These quickly relieve asthma symptoms by widening the narrow and tight airways. They are used as needed and provide short-term relief. Using supplemental oxygen. This may be needed during a severe episode. Using other medicines, such as: Allergy medicines, such as antihistamines, if your asthma attacks are triggered by allergens. Immune medicines (immunomodulators). These are medicines that help control the immune system. Creating an asthma action plan. An asthma action plan is a written plan for managing and treating your asthma attacks. This plan includes: A list of your asthma triggers and how to avoid them. Information about when medicines should be taken and when their dosage should be changed. Instructions about using a device called a peak flow meter. A peak flow meter measures how well the lungs are working and the severity of your asthma. It helps you monitor your condition. Follow these instructions at home: Controlling your home environment Control your home environment in the  following ways to help avoid triggers and prevent asthma attacks: Change your  heating and air conditioning filter regularly. Limit your use of fireplaces and wood stoves. Get rid of pests (such as roaches and mice) and their droppings. Throw away plants if you see mold on them. Clean floors and dust surfaces regularly. Use unscented cleaning products. Try to have someone else vacuum for you regularly. Stay out of rooms while they are being vacuumed and for a short while afterward. If you vacuum, use a dust mask from a hardware store, a double-layered or microfilter vacuum cleaner bag, or a vacuum cleaner with a HEPA filter. Replace carpet with wood, tile, or vinyl flooring. Carpet can trap dander and dust. Use allergy-proof pillows, mattress covers, and box spring covers. Keep your bedroom a trigger-free room. Avoid pets and keep windows closed when allergens are in the air. Wash beddings every week in hot water and dry them in a dryer. Use blankets that are made of polyester or cotton. Clean bathrooms and kitchens with bleach. If possible, have someone repaint the walls in these rooms with mold-resistant paint. Stay out of the rooms that are being cleaned and painted. Wash your hands often with soap and water. If soap and water are not available, use hand sanitizer. Do not allow anyone to smoke in your home. General instructions Take over-the-counter and prescription medicines only as told by your health care provider. Speak with your health care provider if you have questions about how or when to take the medicines. Make note if you are requiring more frequent dosages. Do not use any products that contain nicotine or tobacco, such as cigarettes and e-cigarettes. If you need help quitting, ask your health care provider. Also, avoid being exposed to secondhand smoke. Use a peak flow meter as told by your health care provider. Record and keep track of the readings. Understand and use the asthma  action plan to help minimize, or stop an asthma attack, without needing to seek medical care. Make sure you stay up to date on your yearly vaccinations as told by your health care provider. This may include vaccines for the flu and pneumonia. Avoid outdoor activities when allergen counts are high and when air quality is low. Wear a ski mask that covers your nose and mouth during outdoor winter activities. Exercise indoors on cold days if you can. Warm up before exercising, and take time for a cool-down period after exercise. Keep all follow-up visits as told by your health care provider. This is important. Where to find more information For information about asthma, turn to the Centers for Disease Control and Prevention at http://www.clark.net/ For air quality information, turn to [employer] at https://www.miller-reyes.info/ Contact a health care provider if: You have wheezing, shortness of breath, or a cough even while you are taking medicine to prevent attacks. The mucus you cough up (sputum) is thicker than usual. Your sputum changes from clear or white to yellow, green, gray, or bloody. Your medicines are causing side effects, such as a rash, itching, swelling, or trouble breathing. You need to use a reliever medicine more than 2-3 times a week. Your peak flow reading is still at 50-79% of your personal best after following your action plan for 1 hour. You have a fever. Get help right away if: You are getting worse and do not respond to treatment during an asthma attack. You are short of breath when at rest or when doing very little physical activity. You have difficulty eating, drinking, or talking. You have chest pain or tightness. You develop a fast heartbeat  or palpitations. You have a bluish color to your lips or fingernails. You are light-headed or dizzy, or you faint. Your peak flow reading is less than 50% of your personal best. You feel too tired to breathe normally. Summary Asthma is a long-term  (chronic) condition that causes recurrent episodes in which the airways become tight and narrow. These episodes can cause coughing, wheezing, shortness of breath, and chest pain. Asthma cannot be cured, but medicines and lifestyle changes can help control it and treat acute attacks. Make sure you understand how to avoid triggers and how and when to use your medicines. Asthma attacks can range from minor to life threatening. Get help right away if you have an asthma attack and do not respond to treatment with your usual rescue medicines. This information is not intended to replace advice given to you by your health care provider. Make sure you discuss any questions you have with your healthcare provider. Document Revised: 02/17/2020 Document Reviewed: 09/21/2019 Elsevier Patient Education  2022 [employer].

## 2020-11-16 ENCOUNTER — Encounter: Payer: Self-pay | Admitting: Gastroenterology

## 2020-11-19 NOTE — Progress Notes (Signed)
[id number] chest pain.  Cardiac catheterization 2007 showed normal coronary arteries with distal LAD intramyocardial bridge without obstruction.  Nuclear study June 2020 showed ejection fraction 51% with normal perfusion.  Echocardiogram September 2020 showed normal LV function, trace mitral regurgitation, mild tricuspid regurgitation.  Seen with chest pain April 2022.  Troponins normal.  Hemoglobin 14 and liver functions normal.  CTA April 2022 showed no pulmonary embolus.  Since last seen he has had occasional chest pain.  He states he has had intermittent chest pain for 20 years.  It is substernal and in the left chest area and occasionally radiates to his left upper extremity.  It occurs both with exertion and at rest.  Can last anywhere from minutes to weeks.  Occasional increase with using his upper extremities.  Occasional dyspnea and pedal edema by his report but no syncope.  Current Outpatient Medications  Medication Sig Dispense Refill   albuterol (VENTOLIN HFA) 108 (90 Base) MCG/ACT inhaler Inhale 1-2 puffs into the lungs every 6 (six) hours as needed for wheezing or shortness of breath. 25.5 g 3   Armodafinil 250 MG tablet Take 1 tablet (250 mg total) by mouth daily. 90 tablet 1   Cholecalciferol 50 MCG (2000 UT) TABS Take 1 tablet (2,000 Units total) by mouth daily. 90 tablet 1   fluocinonide cream (LIDEX) 9.81 % Apply 1 application topically 2 (two) times daily. 60 g 2   fluticasone (FLONASE) 50 MCG/ACT nasal spray Place 1 spray into both nostrils daily.     indapamide (LOZOL) 1.25 MG tablet Take 1 tablet (1.25 mg total) by mouth daily. 90 tablet 1   meloxicam (MOBIC) 15 MG tablet Take 1 tablet (15 mg total) by mouth daily. 90 tablet 0   Multiple Vitamin (MULTIVITAMIN WITH MINERALS) TABS tablet Take 1 tablet by mouth daily.     nebivolol (BYSTOLIC) 5 MG tablet Take 1 tablet (5 mg total) by mouth daily. 90 tablet 1   pantoprazole (PROTONIX) 40 MG tablet Take 1 tablet (40 mg  total) by mouth daily. 90 tablet 1   potassium chloride SA (KLOR-CON M20) 20 MEQ tablet Take 1 tablet (20 mEq total) by mouth 2 (two) times daily. 180 tablet 0   rivaroxaban (XARELTO) 10 MG TABS tablet Take 1 tablet (10 mg total) by mouth daily. 90 tablet 1   rosuvastatin (CRESTOR) 10 MG tablet TAKE 1 TABLET BY MOUTH EVERY DAY 90 tablet 1   sildenafil (REVATIO) 20 MG tablet Take 3 tablets (60 mg total) by mouth daily as needed. 60 tablet 5   tadalafil (CIALIS) 20 MG tablet Take 1 tablet (20 mg total) by mouth daily as needed for erectile dysfunction. 6 tablet 5   TRELEGY ELLIPTA 100-62.5-25 MCG/INH AEPB Inhale 1 puff into the lungs daily. 3 each 1   Vitamin D, Ergocalciferol, (DRISDOL) 1.25 MG (50000 UNIT) CAPS capsule TAKE 1 CAPSULE (50,000 UNITS TOTAL) BY MOUTH EVERY 7 (SEVEN) DAYS. 12 capsule 0   omeprazole (PRILOSEC) 40 MG capsule Take 1 capsule (40 mg total) by mouth 2 (two) times daily for 14 days. 28 capsule 0   No current facility-administered medications for this visit.     Past Medical History:  Diagnosis Date   Anxiety    Arthritis    knees - no med   Cluster headache    Hx - resolved per patient   Depression    DVT of lower extremity (deep venous thrombosis) ([place]) 6/26-28/2013   Xarelto - resolved - history  HTN (hypertension)    currently no meds   Irritability and anger    PMH of   Kidney stone    passed stone, no surgery required.   Mild intermittent asthma without complication 9/38/1829   Rotator cuff arthropathy 2007   right   Saddle pulmonary embolus ([place]) 6/26-28/2013   post orthopedic surgery - resolved   Sleep apnea 5/15   moderate OSA-has cpap but does not use it    Past Surgical History:  Procedure Laterality Date   arthroscopic knee surgery  10/09/2011   Dr Ninfa Linden   CARDIAC CATHETERIZATION  2007   LVdysfunction; normal coronaries   KNEE ARTHROSCOPY WITH MEDIAL MENISECTOMY Left 09/20/2014   medial menisectomy chondroplastytella medial plica  excision  ;  Surgeon: Dorna Leitz, MD;  Location: [facility];  Service: Orthopedics;  Laterality: Left;   ROTATOR CUFF REPAIR  2007   right   UPPER GASTROINTESTINAL ENDOSCOPY  2007   normal   WISDOM TOOTH EXTRACTION      Social History   Socioeconomic History   Marital status: Married    Spouse name: Not on file   Number of children: 6   Years of education: 11   Highest education level: Not on file  Occupational History   Occupation: Glass blower/designer    Occupation: Programmer, systems  Tobacco Use   Smoking status: Former    Pack years: 0.00    Types: Cigars   Smokeless tobacco: Never   Tobacco comments:    every 3-4 months  Vaping Use   Vaping Use: Never used  Substance and Sexual Activity   Alcohol use: Yes    Alcohol/week: 0.0 standard drinks    Comment: drinks occasionally   Drug use: No   Sexual activity: Yes    Birth control/protection: Condom  Other Topics Concern   Not on file  Social History Narrative   Fun: Exercise   Social Determinants of Health   Financial Resource Strain: Not on file  Food Insecurity: Not on file  Transportation Needs: Not on file  Physical Activity: Not on file  Stress: Not on file  Social Connections: Not on file  Intimate Partner Violence: Not on file    Family History  Problem Relation Age of Onset   Cancer Mother        Mesothelioma    Diabetes Brother    Hypertension Brother    Stroke Brother 16   Heart attack Brother 79   Diabetes Maternal Aunt    Diabetes Maternal Grandmother    Heart disease Maternal Grandmother    Hypertension Maternal Grandmother    Stroke Maternal Grandmother        in 23s   Deep vein thrombosis Maternal Grandmother    Stroke Maternal Grandfather        > 58   Migraines Brother    Heart attack Brother 110   Healthy Father    Colon cancer Neg Hx    Esophageal cancer Neg Hx    Rectal cancer Neg Hx    Stomach cancer Neg Hx    Liver cancer Neg Hx    Pancreatic cancer Neg Hx     Other Neg Hx        low testosterone    ROS: no fevers or chills, productive cough, hemoptysis, dysphasia, odynophagia, melena, hematochezia, dysuria, hematuria, rash, seizure activity, orthopnea, PND, pedal edema, claudication. Remaining systems are negative.  Physical Exam: Well-developed well-nourished in no acute distress.  Skin is warm and  dry.  HEENT is normal.  Neck is supple.  Chest is clear to auscultation with normal expansion.  Cardiovascular exam is regular rate and rhythm.  Abdominal exam nontender or distended. No masses palpated. Extremities show no edema. neuro grossly intact  ECG-September 04, 2020-sinus rhythm with nonspecific ST changes.  Personally reviewed  Today's electrocardiogram shows normal sinus rhythm at a rate of 72, no significant ST changes.  Personally reviewed.  A/P  1 chest pain-symptoms are atypical and longstanding.  Previous nuclear study showed no ischemia.  Electrocardiogram showed no ST changes.  We discussed a cardiac CTA today.  However I feel that his symptoms are likely not cardiac in etiology and he has elected to continue with observation for now.  We will consider in the future if his symptoms worsen.  2 hypertension-blood pressure controlled.  Continue present medications and follow.  3 hyperlipidemia-continue statin.  Kirk Ruths, MD

## 2020-11-19 NOTE — Progress Notes (Deleted)
HPI: Follow-up chest pain.  Cardiac catheterization 2007 showed normal coronary arteries with distal LAD intramyocardial bridge without obstruction.  Nuclear study June 2020 showed ejection fraction 51% with normal perfusion.  Echocardiogram September 2020 showed normal LV function, trace mitral regurgitation, mild tricuspid regurgitation.  Seen with chest pain April 2022.  Troponins normal.  Hemoglobin 14 and liver functions normal.  CTA April 2022 showed no pulmonary embolus.  Since last seen  Current Outpatient Medications  Medication Sig Dispense Refill   albuterol (VENTOLIN HFA) 108 (90 Base) MCG/ACT inhaler Inhale 1-2 puffs into the lungs every 6 (six) hours as needed for wheezing or shortness of breath. 25.5 g 3   Armodafinil 250 MG tablet Take 1 tablet (250 mg total) by mouth daily. 90 tablet 1   Cholecalciferol 50 MCG (2000 UT) TABS Take 1 tablet (2,000 Units total) by mouth daily. 90 tablet 1   fluocinonide cream (LIDEX) 8.78 % Apply 1 application topically 2 (two) times daily. 60 g 2   fluticasone (FLONASE) 50 MCG/ACT nasal spray Place 1 spray into both nostrils daily.     indapamide (LOZOL) 1.25 MG tablet Take 1 tablet (1.25 mg total) by mouth daily. 90 tablet 1   meloxicam (MOBIC) 15 MG tablet Take 1 tablet (15 mg total) by mouth daily. 90 tablet 0   Multiple Vitamin (MULTIVITAMIN WITH MINERALS) TABS tablet Take 1 tablet by mouth daily.     nebivolol (BYSTOLIC) 5 MG tablet Take 1 tablet (5 mg total) by mouth daily. 90 tablet 1   omeprazole (PRILOSEC) 40 MG capsule Take 1 capsule (40 mg total) by mouth 2 (two) times daily for 14 days. 28 capsule 0   pantoprazole (PROTONIX) 40 MG tablet Take 1 tablet (40 mg total) by mouth daily. 90 tablet 1   potassium chloride SA (KLOR-CON M20) 20 MEQ tablet Take 1 tablet (20 mEq total) by mouth 2 (two) times daily. 180 tablet 0   rivaroxaban (XARELTO) 10 MG TABS tablet Take 1 tablet (10 mg total) by mouth daily. 90 tablet 1   rosuvastatin  (CRESTOR) 10 MG tablet TAKE 1 TABLET BY MOUTH EVERY DAY 90 tablet 1   sildenafil (REVATIO) 20 MG tablet Take 3 tablets (60 mg total) by mouth daily as needed. 60 tablet 5   tadalafil (CIALIS) 20 MG tablet Take 1 tablet (20 mg total) by mouth daily as needed for erectile dysfunction. 6 tablet 5   TRELEGY ELLIPTA 100-62.5-25 MCG/INH AEPB Inhale 1 puff into the lungs daily. 3 each 1   Vitamin D, Ergocalciferol, (DRISDOL) 1.25 MG (50000 UNIT) CAPS capsule TAKE 1 CAPSULE (50,000 UNITS TOTAL) BY MOUTH EVERY 7 (SEVEN) DAYS. 12 capsule 0   No current facility-administered medications for this visit.     Past Medical History:  Diagnosis Date   Anxiety    Arthritis    knees - no med   Cluster headache    Hx - resolved per patient   Depression    DVT of lower extremity (deep venous thrombosis) ([place]) 6/26-28/2013   Xarelto - resolved - history   HTN (hypertension)    currently no meds   Irritability and anger    PMH of   Kidney stone    passed stone, no surgery required.   Mild intermittent asthma without complication 6/76/7209   Rotator cuff arthropathy 2007   right   Saddle pulmonary embolus ([place]) 6/26-28/2013   post orthopedic surgery - resolved   Sleep apnea 5/15   moderate OSA-has  cpap but does not use it    Past Surgical History:  Procedure Laterality Date   arthroscopic knee surgery  10/09/2011   Dr Ninfa Linden   CARDIAC CATHETERIZATION  2007   LVdysfunction; normal coronaries   KNEE ARTHROSCOPY WITH MEDIAL MENISECTOMY Left 09/20/2014   medial menisectomy chondroplastytella medial plica excision  ;  Surgeon: Dorna Leitz, MD;  Location: [facility];  Service: Orthopedics;  Laterality: Left;   ROTATOR CUFF REPAIR  2007   right   UPPER GASTROINTESTINAL ENDOSCOPY  2007   normal   WISDOM TOOTH EXTRACTION      Social History   Socioeconomic History   Marital status: Married    Spouse name: Not on file   Number of children: 6   Years of education: 11   Highest  education level: Not on file  Occupational History   Occupation: Glass blower/designer    Occupation: Programmer, systems  Tobacco Use   Smoking status: Former    Pack years: 0.00    Types: Cigars   Smokeless tobacco: Never   Tobacco comments:    every 3-4 months  Vaping Use   Vaping Use: Never used  Substance and Sexual Activity   Alcohol use: Yes    Alcohol/week: 0.0 standard drinks    Comment: drinks occasionally   Drug use: No   Sexual activity: Yes    Birth control/protection: Condom  Other Topics Concern   Not on file  Social History Narrative   Fun: Exercise   Social Determinants of Health   Financial Resource Strain: Not on file  Food Insecurity: Not on file  Transportation Needs: Not on file  Physical Activity: Not on file  Stress: Not on file  Social Connections: Not on file  Intimate Partner Violence: Not on file    Family History  Problem Relation Age of Onset   Cancer Mother        Mesothelioma    Diabetes Brother    Hypertension Brother    Stroke Brother 39   Heart attack Brother 55   Diabetes Maternal Aunt    Diabetes Maternal Grandmother    Heart disease Maternal Grandmother    Hypertension Maternal Grandmother    Stroke Maternal Grandmother        in 34s   Deep vein thrombosis Maternal Grandmother    Stroke Maternal Grandfather        > 71   Migraines Brother    Heart attack Brother 72   Healthy Father    Colon cancer Neg Hx    Esophageal cancer Neg Hx    Rectal cancer Neg Hx    Stomach cancer Neg Hx    Liver cancer Neg Hx    Pancreatic cancer Neg Hx    Other Neg Hx        low testosterone    ROS: no fevers or chills, productive cough, hemoptysis, dysphasia, odynophagia, melena, hematochezia, dysuria, hematuria, rash, seizure activity, orthopnea, PND, pedal edema, claudication. Remaining systems are negative.  Physical Exam: Well-developed well-nourished in no acute distress.  Skin is warm and dry.  HEENT is normal.  Neck is supple.   Chest is clear to auscultation with normal expansion.  Cardiovascular exam is regular rate and rhythm.  Abdominal exam nontender or distended. No masses palpated. Extremities show no edema. neuro grossly intact  ECG-September 04, 2020-sinus rhythm with nonspecific ST changes.  Personally reviewed  A/P  1 chest pain-symptoms are longstanding and previous evaluation negative.  Kirk Ruths, MD

## 2020-11-20 ENCOUNTER — Other Ambulatory Visit: Payer: Self-pay

## 2020-11-20 ENCOUNTER — Ambulatory Visit (INDEPENDENT_AMBULATORY_CARE_PROVIDER_SITE_OTHER): Payer: BC Managed Care – PPO | Admitting: Cardiology

## 2020-11-20 ENCOUNTER — Encounter: Payer: Self-pay | Admitting: Cardiology

## 2020-11-20 VITALS — BP 134/88 | HR 72 | Ht 67.0 in | Wt 277.6 lb

## 2020-11-20 DIAGNOSIS — R079 Chest pain, unspecified: Secondary | ICD-10-CM

## 2020-11-20 DIAGNOSIS — I10 Essential (primary) hypertension: Secondary | ICD-10-CM | POA: Diagnosis not present

## 2020-11-20 DIAGNOSIS — E782 Mixed hyperlipidemia: Secondary | ICD-10-CM

## 2020-11-20 DIAGNOSIS — E785 Hyperlipidemia, unspecified: Secondary | ICD-10-CM

## 2020-11-20 MED ORDER — ROSUVASTATIN CALCIUM 10 MG PO TABS: 10.0000 mg | ORAL_TABLET | Freq: Every day | ORAL | 1 refills | Status: DC

## 2020-11-20 NOTE — Patient Instructions (Signed)

## 2021-01-01 ENCOUNTER — Telehealth: Payer: Self-pay

## 2021-01-01 ENCOUNTER — Telehealth: Payer: Self-pay | Admitting: *Deleted

## 2021-01-01 NOTE — Telephone Encounter (Signed)
Hi we need a order to hold patient's Xarelto, prescribed by DR Ronnald Ramp , thank you

## 2021-01-01 NOTE — Telephone Encounter (Signed)
01/01/2021   RE: [name] DOB: 1965/12/20 MRN: [id number]   Dear Dr Ronnald Ramp,    We have scheduled the above patient for an endoscopic procedure. Our records show that he is on anticoagulation therapy.   Please advise as to how long the patient may come off his therapy of Xarelto prior to the procedure, which is scheduled for 01-25-2021.  Please fax back/ or route the completed form to [facility] at [phone].   Sincerely,  Harrel Lemon, CMA

## 2021-01-01 NOTE — Telephone Encounter (Signed)
Clearance letter sent. Will await response.

## 2021-01-01 NOTE — Progress Notes (Deleted)
01/01/2021   RE: [name] DOB: [birth year] MRN: [id number]   Dear Dr Ronnald Ramp,    We have scheduled the above patient for an endoscopic procedure. Our records show that he is on anticoagulation therapy.   Please advise as to how long the patient may come off his therapy of Xarelto prior to the procedure, which is scheduled for 01-25-2021.  Please fax back/ or route the completed form to [facility] at [phone].   Sincerely,  Stevan Born, CMA

## 2021-01-02 ENCOUNTER — Encounter: Payer: Self-pay | Admitting: Internal Medicine

## 2021-01-02 ENCOUNTER — Other Ambulatory Visit: Payer: Self-pay | Admitting: Internal Medicine

## 2021-01-02 ENCOUNTER — Other Ambulatory Visit: Payer: Self-pay

## 2021-01-02 ENCOUNTER — Encounter (HOSPITAL_BASED_OUTPATIENT_CLINIC_OR_DEPARTMENT_OTHER): Payer: Self-pay | Admitting: Radiology

## 2021-01-02 ENCOUNTER — Encounter (HOSPITAL_BASED_OUTPATIENT_CLINIC_OR_DEPARTMENT_OTHER): Payer: Self-pay | Admitting: Obstetrics and Gynecology

## 2021-01-02 ENCOUNTER — Emergency Department (HOSPITAL_BASED_OUTPATIENT_CLINIC_OR_DEPARTMENT_OTHER)
Admission: EM | Admit: 2021-01-02 | Discharge: 2021-01-03 | Disposition: A | Discharge status: Home / Self Care | Payer: BC Managed Care – PPO | Attending: Emergency Medicine | Admitting: Emergency Medicine

## 2021-01-02 ENCOUNTER — Ambulatory Visit (INDEPENDENT_AMBULATORY_CARE_PROVIDER_SITE_OTHER): Payer: BC Managed Care – PPO | Admitting: Internal Medicine

## 2021-01-02 ENCOUNTER — Emergency Department (HOSPITAL_BASED_OUTPATIENT_CLINIC_OR_DEPARTMENT_OTHER): Payer: BC Managed Care – PPO

## 2021-01-02 VITALS — BP 130/78 | HR 83 | Ht 67.0 in | Wt 275.0 lb

## 2021-01-02 DIAGNOSIS — R739 Hyperglycemia, unspecified: Secondary | ICD-10-CM | POA: Diagnosis not present

## 2021-01-02 DIAGNOSIS — K219 Gastro-esophageal reflux disease without esophagitis: Secondary | ICD-10-CM | POA: Insufficient documentation

## 2021-01-02 DIAGNOSIS — R1110 Vomiting, unspecified: Secondary | ICD-10-CM | POA: Diagnosis not present

## 2021-01-02 DIAGNOSIS — J45909 Unspecified asthma, uncomplicated: Secondary | ICD-10-CM | POA: Insufficient documentation

## 2021-01-02 DIAGNOSIS — R1033 Periumbilical pain: Secondary | ICD-10-CM | POA: Diagnosis not present

## 2021-01-02 DIAGNOSIS — I10 Essential (primary) hypertension: Secondary | ICD-10-CM

## 2021-01-02 DIAGNOSIS — L03311 Cellulitis of abdominal wall: Secondary | ICD-10-CM | POA: Insufficient documentation

## 2021-01-02 DIAGNOSIS — F17290 Nicotine dependence, other tobacco product, uncomplicated: Secondary | ICD-10-CM | POA: Insufficient documentation

## 2021-01-02 DIAGNOSIS — Z7901 Long term (current) use of anticoagulants: Secondary | ICD-10-CM | POA: Insufficient documentation

## 2021-01-02 DIAGNOSIS — K429 Umbilical hernia without obstruction or gangrene: Secondary | ICD-10-CM | POA: Insufficient documentation

## 2021-01-02 DIAGNOSIS — K420 Umbilical hernia with obstruction, without gangrene: Secondary | ICD-10-CM

## 2021-01-02 DIAGNOSIS — Z7951 Long term (current) use of inhaled steroids: Secondary | ICD-10-CM | POA: Diagnosis not present

## 2021-01-02 LAB — COMPREHENSIVE METABOLIC PANEL
ALT: 22 U/L (ref 0–44)
AST: 16 U/L (ref 15–41)
Albumin: 4.2 g/dL (ref 3.5–5.0)
Alkaline Phosphatase: 48 U/L (ref 38–126)
Anion gap: 9 (ref 5–15)
BUN: 10 mg/dL (ref 6–20)
CO2: 28 mmol/L (ref 22–32)
Calcium: 9.1 mg/dL (ref 8.9–10.3)
Chloride: 104 mmol/L (ref 98–111)
Creatinine, Ser: 0.84 mg/dL (ref 0.61–1.24)
GFR, Estimated: >60 mL/min (ref >60)
Glucose, Bld: 89 mg/dL (ref 70–99)
Potassium: 3.6 mmol/L (ref 3.5–5.1)
Sodium: 141 mmol/L (ref 135–145)
Total Bilirubin: 0.5 mg/dL (ref 0.3–1.2)
Total Protein: 7.5 g/dL (ref 6.5–8.1)

## 2021-01-02 LAB — CBC
HCT: 42.7 % (ref 39.0–52.0)
Hemoglobin: 13.9 g/dL (ref 13.0–17.0)
MCH: 28.8 pg (ref 26.0–34.0)
MCHC: 32.6 g/dL (ref 30.0–36.0)
MCV: 88.6 fL (ref 80.0–100.0)
Platelets: 252 10*3/uL (ref 150–400)
RBC: 4.82 MIL/uL (ref 4.22–5.81)
RDW: 13.9 % (ref 11.5–15.5)
WBC: 11.5 10*3/uL — ABNORMAL HIGH (ref 4.0–10.5)
nRBC: 0 % (ref 0.0–0.2)

## 2021-01-02 LAB — URINALYSIS, ROUTINE W REFLEX MICROSCOPIC
Bilirubin Urine: NEGATIVE
Glucose, UA: NEGATIVE mg/dL
Hgb urine dipstick: NEGATIVE
Ketones, ur: NEGATIVE mg/dL
Leukocytes,Ua: NEGATIVE
Nitrite: NEGATIVE
Protein, ur: NEGATIVE mg/dL
Specific Gravity, Urine: 1.025 (ref 1.005–1.030)
pH: 5 (ref 5.0–8.0)

## 2021-01-02 LAB — LIPASE, BLOOD: Lipase: 38 U/L (ref 11–51)

## 2021-01-02 LAB — OCCULT BLOOD X 1 CARD TO LAB, STOOL: Fecal Occult Bld: NEGATIVE

## 2021-01-02 MED ORDER — IOHEXOL 300 MG/ML  SOLN
100.0000 mL | Freq: Once | INTRAMUSCULAR | Status: AC | PRN
  Administered 2021-01-02: 100 mL via INTRAVENOUS

## 2021-01-02 MED ORDER — HYDROMORPHONE HCL 1 MG/ML IJ SOLN
1.0000 mg | Freq: Once | INTRAMUSCULAR | Status: AC
  Administered 2021-01-02: 1 mg via INTRAVENOUS
  Filled 2021-01-02: qty 1

## 2021-01-02 NOTE — Telephone Encounter (Signed)
Received letter from Dr Ronnald Ramp' office advising that patient can hold Xarelto 2 days prior to colonoscopy and should restart the day after.  Left message on voicemail for patient to return call for instructions.  Otherwise, patient will be notified at previsit appointment on 01-09-2021.

## 2021-01-02 NOTE — Patient Instructions (Signed)
Please go to the [facility] at [facility] now for evaluation for possible "incarcerated umbilical hernia"

## 2021-01-02 NOTE — Telephone Encounter (Signed)
Signed letter has been faxed to [facility].

## 2021-01-02 NOTE — Telephone Encounter (Signed)
Received letter from Dr Ronnald Ramp' office advising that patient can hold Xarelto 2 days prior to colonoscopy and should restart the day after.   Left message on voicemail for patient to return call for instructions.  Otherwise, patient will be notified at previsit appointment on 01-09-2021.

## 2021-01-02 NOTE — Progress Notes (Signed)
Patient ID: [name], male   DOB: 1965/08/28, 55 y.o.   MRN: [id number]        Chief Complaint: follow up mid abd pain x 1 mo       HPI:  [name] is a 55 y.o. male here with c/o 1 mo mid abd pain with soreness tender at the umbilicus with a swelling that comes and goes, after lifting quite a bit at work of heavy objects.  Denies worsening reflux, dysphagia, n/v, bowel change or blood.  Pain has now become mod to severe, constant, dull, no radiation, nonpositional, non exertional, just cant get away from the pain now, very sore, wife finally made him come in today.    Denies fever, chills and Pt denies chest pain, increased sob or doe, wheezing, orthopnea, PND, increased LE swelling, palpitations, dizziness or syncope.   Pt denies polydipsia, polyuria, or new focal neuro s/s.       Wt Readings from Last 3 Encounters:  01/02/21 275 lb (124.7 kg)  01/02/21 275 lb (124.7 kg)  11/20/20 277 lb 9.6 oz (125.9 kg)   BP Readings from Last 3 Encounters:  01/02/21 (!) 144/103  01/02/21 130/78  11/20/20 134/88         Past Medical History:  Diagnosis Date   Anxiety    Arthritis    knees - no med   Cluster headache    Hx - resolved per patient   Depression    DVT of lower extremity (deep venous thrombosis) ([place]) 6/26-28/2013   Xarelto - resolved - history   HTN (hypertension)    currently no meds   Irritability and anger    PMH of   Kidney stone    passed stone, no surgery required.   Mild intermittent asthma without complication [id number]   Rotator cuff arthropathy 2007   right   Saddle pulmonary embolus ([place]) 6/26-28/2013   post orthopedic surgery - resolved   Sleep apnea 5/15   moderate OSA-has cpap but does not use it   Past Surgical History:  Procedure Laterality Date   arthroscopic knee surgery  10/09/2011   Dr Ninfa Linden   CARDIAC CATHETERIZATION  2007   LVdysfunction; normal coronaries   KNEE ARTHROSCOPY WITH MEDIAL MENISECTOMY Left 09/20/2014   medial  menisectomy chondroplastytella medial plica excision  ;  Surgeon: Dorna Leitz, MD;  Location: [facility];  Service: Orthopedics;  Laterality: Left;   ROTATOR CUFF REPAIR  2007   right   UPPER GASTROINTESTINAL ENDOSCOPY  2007   normal   WISDOM TOOTH EXTRACTION      reports that he has been smoking cigars. He has never used smokeless tobacco. He reports current alcohol use. He reports that he does not use drugs. family history includes Cancer in his mother; Deep vein thrombosis in his maternal grandmother; Diabetes in his brother, maternal aunt, and maternal grandmother; Healthy in his father; Heart attack (age of onset: 65) in his brother; Heart attack (age of onset: 77) in his brother; Heart disease in his maternal grandmother; Hypertension in his brother and maternal grandmother; Migraines in his brother; Stroke in his maternal grandfather and maternal grandmother; Stroke (age of onset: 90) in his brother. Allergies  Allergen Reactions   Tramadol Other (See Comments)    insomnia   Current Outpatient Medications on File Prior to Visit  Medication Sig Dispense Refill   albuterol (VENTOLIN HFA) 108 (90 Base) MCG/ACT inhaler Inhale 1-2 puffs into the lungs every 6 (six) hours as  needed for wheezing or shortness of breath. 25.5 g 3   Armodafinil 250 MG tablet Take 1 tablet (250 mg total) by mouth daily. 90 tablet 1   Cholecalciferol 50 MCG (2000 UT) TABS Take 1 tablet (2,000 Units total) by mouth daily. 90 tablet 1   fluocinonide cream (LIDEX) [id number] % Apply 1 application topically 2 (two) times daily. 60 g 2   fluticasone (FLONASE) 50 MCG/ACT nasal spray Place 1 spray into both nostrils daily.     indapamide (LOZOL) 1.25 MG tablet Take 1 tablet (1.25 mg total) by mouth daily. 90 tablet 1   meloxicam (MOBIC) 15 MG tablet Take 1 tablet (15 mg total) by mouth daily. 90 tablet 0   Multiple Vitamin (MULTIVITAMIN WITH MINERALS) TABS tablet Take 1 tablet by mouth daily.     nebivolol  (BYSTOLIC) 5 MG tablet Take 1 tablet (5 mg total) by mouth daily. 90 tablet 1   pantoprazole (PROTONIX) 40 MG tablet Take 1 tablet (40 mg total) by mouth daily. 90 tablet 1   potassium chloride SA (KLOR-CON M20) 20 MEQ tablet Take 1 tablet (20 mEq total) by mouth 2 (two) times daily. 180 tablet 0   rivaroxaban (XARELTO) 10 MG TABS tablet Take 1 tablet (10 mg total) by mouth daily. 90 tablet 1   rosuvastatin (CRESTOR) 10 MG tablet Take 1 tablet (10 mg total) by mouth daily. 90 tablet 1   sildenafil (REVATIO) 20 MG tablet Take 3 tablets (60 mg total) by mouth daily as needed. 60 tablet 5   tadalafil (CIALIS) 20 MG tablet Take 1 tablet (20 mg total) by mouth daily as needed for erectile dysfunction. 6 tablet 5   TRELEGY ELLIPTA 100-62.5-25 MCG/INH AEPB Inhale 1 puff into the lungs daily. 3 each 1   Vitamin D, Ergocalciferol, (DRISDOL) 1.25 MG (50000 UNIT) CAPS capsule TAKE 1 CAPSULE (50,000 UNITS TOTAL) BY MOUTH EVERY 7 (SEVEN) DAYS. 12 capsule 0   omeprazole (PRILOSEC) 40 MG capsule Take 1 capsule (40 mg total) by mouth 2 (two) times daily for 14 days. 28 capsule 0   No current facility-administered medications on file prior to visit.        ROS:  All others reviewed and negative.  Objective        PE:  BP 130/78 (BP Location: Left Arm, Patient Position: Sitting, Cuff Size: Large)   Pulse 83   Ht '5\' 7"'$  (1.702 m)   Wt 275 lb (124.7 kg)   SpO2 92%   BMI 43.07 kg/m                 Constitutional: Pt appears in NAD               HENT: Head: NCAT.                Right Ear: External ear normal.                 Left Ear: External ear normal.                Eyes: . Pupils are equal, round, and reactive to light. Conjunctivae and EOM are normal               Nose: without d/c or deformity               Neck: Neck supple. Gross normal ROM               Cardiovascular: Normal rate and regular rhythm.  Pulmonary/Chest: Effort normal and breath sounds without rales or wheezing.                 Abd:  Soft, ND, + BS, no organomegaly, mod to severe tender periumbilical area but no overt swelling or hernia noted, no guarding or rebound               Neurological: Pt is alert. At baseline orientation, motor grossly intact               Skin: Skin is warm. No rashes, no other new lesions, LE edema - none               Psychiatric: Pt behavior is normal without agitation   Micro: none  Cardiac tracings I have personally interpreted today:  none  Pertinent Radiological findings (summarize): none   Lab Results  Component Value Date   WBC 11.5 (H) 01/02/2021   HGB 13.9 01/02/2021   HCT 42.7 01/02/2021   PLT 252 01/02/2021   GLUCOSE 89 01/02/2021   CHOL 125 09/04/2020   TRIG 79 09/04/2020   HDL 52 09/04/2020   LDLCALC 57 09/04/2020   ALT 22 01/02/2021   AST 16 01/02/2021   NA 141 01/02/2021   K 3.6 01/02/2021   CL 104 01/02/2021   CREATININE 0.84 01/02/2021   BUN 10 01/02/2021   CO2 28 01/02/2021   TSH 1.48 09/04/2020   PSA 0.3 12/22/2019   INR CANCELED 08/12/2013   HGBA1C 5.7 (H) 12/22/2019   Assessment/Plan:  TEGH BRYS is a 55 y.o. Black or African American [2] male with  has a past medical history of Anxiety, Arthritis, Cluster headache, Depression, DVT of lower extremity (deep venous thrombosis) ([place]) (6/26-28/2013), HTN (hypertension), Irritability and anger, Kidney stone, Mild intermittent asthma without complication ([id number]), Rotator cuff arthropathy (2007), Saddle pulmonary embolus ([place]) (6/26-28/2013), and Sleep apnea (5/15).  Umbilical hernia With high suspicion for incarceratoin, pt advised to go to ED now for likely CT and lab evaluation,  to f/u any worsening symptoms or concerns  Hyperglycemia Lab Results  Component Value Date   HGBA1C 5.7 (H) 12/22/2019   Stable, pt to continue current medical treatment  - diet   Essential hypertension BP Readings from Last 3 Encounters:  01/02/21 (!) 144/103  01/02/21 130/78  11/20/20 134/88    Mild elevated, likely reactive, pt to continue medical treatment lozol, bystolic and continue to monitor at home and next visit  Followup: Return if symptoms worsen or fail to improve.  Cathlean Cower, MD 01/02/2021 6:47 PM [facility]

## 2021-01-02 NOTE — ED Provider Notes (Signed)
[facility] EMERGENCY DEPT Provider Note   CSN: [id number] Arrival date & time: 01/02/21  1642     History Chief Complaint  Patient presents with   Abdominal Pain    [name] is a 55 y.o. male.  The history is provided by the patient, the spouse and medical records.  Abdominal Pain Pain location:  Periumbilical Pain quality: aching   Pain radiates to:  Does not radiate Pain severity:  Severe Onset quality:  Gradual Timing:  Constant Progression:  Waxing and waning Chronicity:  New Context: not previous surgeries and not trauma   Relieved by:  Nothing Worsened by:  Palpation and movement Ineffective treatments:  None tried Associated symptoms: constipation   Associated symptoms: no chest pain, no chills, no cough, no diarrhea, no dysuria, no fatigue, no fever, no hematemesis, no nausea, no shortness of breath and no vomiting   Associated symptoms comment:  Possible dark stools     Past Medical History:  Diagnosis Date   Anxiety    Arthritis    knees - no med   Cluster headache    Hx - resolved per patient   Depression    DVT of lower extremity (deep venous thrombosis) ([place]) 6/26-28/2013   Xarelto - resolved - history   HTN (hypertension)    currently no meds   Irritability and anger    PMH of   Kidney stone    passed stone, no surgery required.   Mild intermittent asthma without complication [id number]   Rotator cuff arthropathy 2007   right   Saddle pulmonary embolus ([place]) 6/26-28/2013   post orthopedic surgery - resolved   Sleep apnea 5/15   moderate OSA-has cpap but does not use it    Patient Active Problem List   Diagnosis Date Noted   Umbilical hernia [id number]   Mild intermittent asthma without complication [id number]   Polyp of colon 11/13/2020   Hypogonadism male 02/10/2020   Erectile dysfunction due to arterial insufficiency 02/08/2020   Intrinsic eczema 02/08/2020   H. pylori infection 01/25/2020   Low back pain  12/23/2019   Excessive somnolence disorder 05/09/2019   AC (acromioclavicular) arthritis 05/03/2019   Subacromial bursitis of right shoulder joint 05/03/2019   Diuretic-induced hypokalemia 02/03/2019   Ejection fraction < 50% 02/03/2019   Vitamin D deficiency disease 11/03/2018   PUD (peptic ulcer disease) 01/20/2018   Renal stone 08/26/2017   Morbid obesity ([place]) 08/25/2017   GERD with esophagitis 08/24/2017   Degenerative arthritis of knee, bilateral 06/08/2017   Venous insufficiency of both lower extremities 10/05/2015   Routine general medical examination at a health care facility 08/21/2015   Heterozygous factor V Leiden mutation ([place]) 08/24/2013   OSA (obstructive sleep apnea) 08/13/2013   Incomplete right bundle branch block 06/18/2011   Hyperglycemia 11/10/2008   Mixed hyperlipidemia 07/06/2008   Essential hypertension 07/06/2008   ERECTILE DYSFUNCTION, ORGANIC 01/28/2007    Past Surgical History:  Procedure Laterality Date   arthroscopic knee surgery  10/09/2011   Dr Ninfa Linden   CARDIAC CATHETERIZATION  2007   LVdysfunction; normal coronaries   KNEE ARTHROSCOPY WITH MEDIAL MENISECTOMY Left 09/20/2014   medial menisectomy chondroplastytella medial plica excision  ;  Surgeon: Dorna Leitz, MD;  Location: [facility];  Service: Orthopedics;  Laterality: Left;   ROTATOR CUFF REPAIR  2007   right   UPPER GASTROINTESTINAL ENDOSCOPY  2007   normal   WISDOM TOOTH EXTRACTION         Family History  Problem Relation Age of Onset   Cancer Mother        Mesothelioma    Diabetes Brother    Hypertension Brother    Stroke Brother 84   Heart attack Brother 77   Diabetes Maternal Aunt    Diabetes Maternal Grandmother    Heart disease Maternal Grandmother    Hypertension Maternal Grandmother    Stroke Maternal Grandmother        in 36s   Deep vein thrombosis Maternal Grandmother    Stroke Maternal Grandfather        > 55   Migraines Brother    Heart attack  Brother 54   Healthy Father    Colon cancer Neg Hx    Esophageal cancer Neg Hx    Rectal cancer Neg Hx    Stomach cancer Neg Hx    Liver cancer Neg Hx    Pancreatic cancer Neg Hx    Other Neg Hx        low testosterone    Social History   Tobacco Use   Smoking status: Some Days    Types: Cigars   Smokeless tobacco: Never   Tobacco comments:    every 3-4 months  Vaping Use   Vaping Use: Never used  Substance Use Topics   Alcohol use: Yes    Alcohol/week: 0.0 standard drinks    Comment: drinks occasionally   Drug use: No    Home Medications Prior to Admission medications   Medication Sig Start Date End Date Taking? Authorizing Provider  albuterol (VENTOLIN HFA) 108 (90 Base) MCG/ACT inhaler Inhale 1-2 puffs into the lungs every 6 (six) hours as needed for wheezing or shortness of breath. 11/13/20   [name], MD  Armodafinil 250 MG tablet Take 1 tablet (250 mg total) by mouth daily. 02/08/20   [name], MD  Cholecalciferol 50 MCG (2000 UT) TABS Take 1 tablet (2,000 Units total) by mouth daily. 09/08/19   [name], MD  fluocinonide cream (LIDEX) [id number] % Apply 1 application topically 2 (two) times daily. 02/08/20   [name], MD  fluticasone (FLONASE) 50 MCG/ACT nasal spray Place 1 spray into both nostrils daily. 12/16/18   [provider]  indapamide (LOZOL) 1.25 MG tablet Take 1 tablet (1.25 mg total) by mouth daily. 10/02/20   [name], MD  meloxicam (MOBIC) 15 MG tablet Take 1 tablet (15 mg total) by mouth daily. 11/13/20   [name], MD  Multiple Vitamin (MULTIVITAMIN WITH MINERALS) TABS tablet Take 1 tablet by mouth daily.    [provider]  nebivolol (BYSTOLIC) 5 MG tablet Take 1 tablet (5 mg total) by mouth daily. 10/02/20   [name], MD  omeprazole (PRILOSEC) 40 MG capsule Take 1 capsule (40 mg total) by mouth 2 (two) times daily for 14 days. 01/25/20 02/08/20  Zehr, Laban Emperor, PA-C  pantoprazole (PROTONIX) 40 MG tablet  Take 1 tablet (40 mg total) by mouth daily. 12/22/19   [name], MD  potassium chloride SA (KLOR-CON M20) 20 MEQ tablet Take 1 tablet (20 mEq total) by mouth 2 (two) times daily. 11/13/20   [name], MD  rivaroxaban (XARELTO) 10 MG TABS tablet Take 1 tablet (10 mg total) by mouth daily. 10/02/20   [name], MD  rosuvastatin (CRESTOR) 10 MG tablet Take 1 tablet (10 mg total) by mouth daily. 11/20/20   Lelon Perla, MD  sildenafil (REVATIO) 20 MG tablet Take  3 tablets (60 mg total) by mouth daily as needed. 02/03/20   [name], MD  tadalafil (CIALIS) 20 MG tablet Take 1 tablet (20 mg total) by mouth daily as needed for erectile dysfunction. 08/31/20   [name], MD  TRELEGY ELLIPTA 100-62.5-25 MCG/INH AEPB Inhale 1 puff into the lungs daily. 11/13/20   [name], MD  Vitamin D, Ergocalciferol, (DRISDOL) 1.25 MG (50000 UNIT) CAPS capsule TAKE 1 CAPSULE (50,000 UNITS TOTAL) BY MOUTH EVERY 7 (SEVEN) DAYS. 09/26/19   Lyndal Pulley, DO    Allergies    Tramadol  Review of Systems   Review of Systems  Constitutional:  Negative for chills, fatigue and fever.  HENT:  Negative for congestion.   Respiratory:  Negative for cough, chest tightness, shortness of breath and wheezing.   Cardiovascular:  Negative for chest pain, palpitations and leg swelling.  Gastrointestinal:  Positive for abdominal pain and constipation. Negative for abdominal distention, diarrhea, hematemesis, nausea and vomiting.  Genitourinary:  Negative for decreased urine volume, dysuria, flank pain, frequency, penile discharge, penile pain, penile swelling, scrotal swelling and testicular pain.  Musculoskeletal:  Negative for back pain and neck pain.  Skin:  Negative for rash.  Neurological:  Negative for light-headedness and headaches.  Psychiatric/Behavioral:  Negative for agitation and confusion.   All other systems reviewed and are negative.  Physical Exam Updated Vital Signs BP (!) 146/89  (BP Location: Right Arm)   Pulse 70   Temp 98.2 F (36.8 C)   Resp 18   Ht '5\' 7"'$  (1.702 m)   Wt 124.7 kg   SpO2 99%   BMI 43.07 kg/m   Physical Exam Vitals and nursing note reviewed. Exam conducted with a chaperone present.  Constitutional:      General: He is not in acute distress.    Appearance: He is well-developed. He is not ill-appearing, toxic-appearing or diaphoretic.  HENT:     Head: Normocephalic and atraumatic.     Mouth/Throat:     Mouth: Mucous membranes are moist.     Pharynx: No pharyngeal swelling or oropharyngeal exudate.  Eyes:     Conjunctiva/sclera: Conjunctivae normal.  Cardiovascular:     Rate and Rhythm: Normal rate and regular rhythm.     Heart sounds: No murmur heard. Pulmonary:     Effort: Pulmonary effort is normal. No respiratory distress.     Breath sounds: Normal breath sounds.  Abdominal:     General: Abdomen is flat. Bowel sounds are normal. There is no distension.     Palpations: Abdomen is soft.     Tenderness: There is abdominal tenderness in the periumbilical area. There is no right CVA tenderness, left CVA tenderness, guarding or rebound.     Hernia: No hernia is present.  Genitourinary:    Penis: Normal.      Testes: Normal.        Right: Tenderness not present.        Left: Tenderness not present.  Musculoskeletal:     Cervical back: Neck supple.  Skin:    General: Skin is warm and dry.     Capillary Refill: Capillary refill takes less than 2 seconds.     Coloration: Skin is not pale.  Neurological:     General: No focal deficit present.     Mental Status: He is alert.  Psychiatric:        Mood and Affect: Mood is anxious.    ED Results / Procedures / Treatments  Labs (all labs ordered are listed, but only abnormal results are displayed) Labs Reviewed  CBC - Abnormal; Notable for the following components:      Result Value   WBC 11.5 (*)    All other components within normal limits  LIPASE, BLOOD  COMPREHENSIVE  METABOLIC PANEL  URINALYSIS, ROUTINE W REFLEX MICROSCOPIC  OCCULT BLOOD X 1 CARD TO LAB, STOOL  POC OCCULT BLOOD, ED    EKG None  Radiology CT ABDOMEN PELVIS W CONTRAST  Result Date: 01/02/2021 CLINICAL DATA:  Nausea vomiting diarrhea pain EXAM: CT ABDOMEN AND PELVIS WITH CONTRAST TECHNIQUE: Multidetector CT imaging of the abdomen and pelvis was performed using the standard protocol following bolus administration of intravenous contrast. CONTRAST:  198m OMNIPAQUE IOHEXOL 300 MG/ML  SOLN COMPARISON:  CT 09/07/2017 FINDINGS: Lower chest: Lung bases demonstrate no acute consolidation or effusion. Normal cardiac size. Stable subpleural left lower lobe pulmonary nodule. Hepatobiliary: Subcentimeter hypodensity in the right hepatic lobe too small to further characterize. No calcified gallstone or biliary dilatation Pancreas: Unremarkable. No pancreatic ductal dilatation or surrounding inflammatory changes. Spleen: Normal in size without focal abnormality. Adrenals/Urinary Tract: Stable right adrenal gland 1 cm nodule. Left adrenal gland is normal. Cysts in the right kidney. 5 mm nonobstructing stone lower pole left kidney. Additional subcentimeter hypodense renal lesions too small to further characterize. The bladder is normal Stomach/Bowel: Stomach is within normal limits. Appendix appears normal. No evidence of bowel wall thickening, distention, or inflammatory changes. Diverticular disease of left colon without acute inflammation. Vascular/Lymphatic: Mild aortic atherosclerosis without aneurysm. No enlarged abdominal or pelvic lymph nodes. Reproductive: Prostate is unremarkable. Other: Negative for free air or free fluid Musculoskeletal: No acute or significant osseous findings. IMPRESSION: 1. No CT evidence for acute intra-abdominal or pelvic abnormality. 2. Nonobstructing left kidney stone 3. Diverticular disease of the left colon without acute inflammatory change Electronically Signed   By: KDonavan Foil M.D.   On: 01/02/2021 23:17    Procedures Procedures   Medications Ordered in ED Medications  HYDROmorphone (DILAUDID) injection 1 mg (1 mg Intravenous Given 01/02/21 2248)  iohexol (OMNIPAQUE) 300 MG/ML solution 100 mL (100 mLs Intravenous Contrast Given 01/02/21 2254)    ED Course  I have reviewed the triage vital signs and the nursing notes.  Pertinent labs & imaging results that were available during my care of the patient were reviewed by me and considered in my medical decision making (see chart for details).    MDM Rules/Calculators/A&P                           [name] VAILLANTis a 55y.o. male with a past medical history significant for hypertension, hyperlipidemia, venous insufficiency in the legs, previous DVT on Xarelto, kidney stone, and asthma who presents with abdominal pain.  According to patient, for the last month he has been having on and off episodes of pain in his abdomen around the umbilicus.  He also reports occasional dark stools but denies history of GI bleeds.  He denies any urinary changes.  Denies trauma.  Denies any history of hernias to his knowledge.  Denies fevers, chills, nausea, vomiting, hematemesis, fevers, chills, congestion, cough, chest pain, shortness of breath.  Patient reports occasional groin pains but no testicle pain at this time.  On exam with a chaperone, abdomen is tender to palpation primarily around the umbilicus.  Normal bowel sounds.  Could not palpate a large hernia.  No testicle  tenderness or groin tenderness.  No hernias palpated in the inguinal areas.  Lungs clear and chest nontender.  Back nontender.  Clinically, I do feel we need to get a CT scan to rule out possible hernia or other subcutaneous abnormality.  Given lack of any testicle or groin symptoms, agree to hold on ultrasound this time.   Patient given pain medicine.  Labs were obtained which were overall reassuring.  CT scan does not show acute intra-abdominal pathology  however review of the images by me does appear to show some inflammation or even early cellulitis in the periumbilical area.  We will give antibiotics and pain medicine for him.  He will follow-up with his PCP and his GI doctor next week.  He had a negative fecal occult so do not suspect rectal bleeding at this time.  CT scan did show some diverticulosis which could be a cause of intermittent rectal bleeding that was not seen today on occult test  Given patient's overall well appearance with feel he safe for discharge home.  Patient discharged in good condition.   Final Clinical Impression(s) / ED Diagnoses Final diagnoses:  Periumbilical abdominal pain  Cellulitis of abdominal wall    Rx / DC Orders ED Discharge Orders          Ordered    oxyCODONE-acetaminophen (PERCOCET/ROXICET) 5-325 MG tablet  Every 4 hours PRN        01/03/21 0026    doxycycline (VIBRAMYCIN) 100 MG capsule  2 times daily        01/03/21 0026    cephALEXin (KEFLEX) 500 MG capsule  4 times daily        01/03/21 0026           Clinical Impression: 1. Periumbilical abdominal pain   2. Cellulitis of abdominal wall     Disposition: Discharge  Condition: Good  I have discussed the results, Dx and Tx plan with the pt(& family if present). He/she/they expressed understanding and agree(s) with the plan. Discharge instructions discussed at great length. Strict return precautions discussed and pt &/or family have verbalized understanding of the instructions. No further questions at time of discharge.    New Prescriptions   CEPHALEXIN (KEFLEX) 500 MG CAPSULE    Take 1 capsule (500 mg total) by mouth 4 (four) times daily for 7 days.   DOXYCYCLINE (VIBRAMYCIN) 100 MG CAPSULE    Take 1 capsule (100 mg total) by mouth 2 (two) times daily.   OXYCODONE-ACETAMINOPHEN (PERCOCET/ROXICET) 5-325 MG TABLET    Take 1 tablet by mouth every 4 (four) hours as needed for severe pain.    Follow Up: Your PCP and  gastroenterologist     [name], MD [place] [address] [facility] [place] [phone]        Jaryah Aracena, Gwenyth Allegra, MD 01/03/21 [id number]

## 2021-01-02 NOTE — Assessment & Plan Note (Signed)
BP Readings from Last 3 Encounters:  01/02/21 (!) 144/103  01/02/21 130/78  11/20/20 134/88   Mild elevated, likely reactive, pt to continue medical treatment lozol, bystolic and continue to monitor at home and next visit

## 2021-01-02 NOTE — Assessment & Plan Note (Signed)
Lab Results  Component Value Date   HGBA1C 5.7 (H) 12/22/2019   Stable, pt to continue current medical treatment  - diet

## 2021-01-02 NOTE — ED Triage Notes (Signed)
Patient reports to the ER for abdominal pain. Patient denies N/V/D. Denies hematuria or blood in stool. Pain x1 month

## 2021-01-02 NOTE — Assessment & Plan Note (Signed)
With high suspicion for incarceratoin, pt advised to go to ED now for likely CT and lab evaluation,  to f/u any worsening symptoms or concerns

## 2021-01-03 MED ORDER — OXYCODONE-ACETAMINOPHEN 5-325 MG PO TABS: 1.0000 | ORAL_TABLET | ORAL | 0 refills | Status: DC | PRN

## 2021-01-03 MED ORDER — DOXYCYCLINE HYCLATE 100 MG PO CAPS
100.0000 mg | ORAL_CAPSULE | Freq: Two times a day (BID) | ORAL | 0 refills | Status: DC
Start: 2021-01-03 — End: 2021-01-22

## 2021-01-03 MED ORDER — CEPHALEXIN 500 MG PO CAPS: 500.0000 mg | ORAL_CAPSULE | Freq: Four times a day (QID) | ORAL | 0 refills | Status: AC

## 2021-01-03 NOTE — Discharge Instructions (Addendum)
Your work-up today did not show evidence of large hernia or any other surgical problem.  We did see what appeared to be evidence of cellulitis or inflammation just under the skin near your umbilicus so please take the antibiotics to treat for possible infection.  Please use the pain medicine to help with discomfort and please rest and stay hydrated.  Please follow-up with your GI team as you described.  If any symptoms change or worsen acutely, please return to the nearest emergency department.

## 2021-01-18 ENCOUNTER — Telehealth: Payer: Self-pay | Admitting: Family Medicine

## 2021-01-18 NOTE — Telephone Encounter (Signed)
Pt is really struggling with knee pain, worse than normal. He is requesting something for pain.

## 2021-01-21 NOTE — Telephone Encounter (Signed)
Appt made for 01/22/2021

## 2021-01-21 NOTE — Progress Notes (Signed)
[place] Phone: [phone] Subjective:   [name], am serving as a scribe for Dr. Hulan Saas.  This visit occurred during the SARS-CoV-2 public health emergency.  Safety protocols were in place, including screening questions prior to the visit, additional usage of staff PPE, and extensive cleaning of exam room while observing appropriate contact time as indicated for disinfecting solutions.    I'm seeing this patient by the request  of:  [name], MD  CC: Bilateral knee, shoulder pain  [id number]  11/08/2020 Patient given repeat injection.  Patient is chronic pain with exacerbation.  We will see if we can get approval for gel injections again which patient has responded to in the past but has had denial from insurance previously.  We will see if we can get approval.  Continue to work on the weight at this point.  Discussed wanting to get patient's BMI under 40.  Increase activity slowly.  Follow-up with me again in 2 to 3 months  Patient given another injection today.  Tolerated the procedure well.  Hoping that this will be continued to make some improvement.  He does have severe arthritic changes.  Follow-up with me again in 2 to 3 months  Update 01/21/2021 [name] is a 55 y.o. male coming in with complaint of R shoulder and B knee pain. Patient states that his shoulder is bothering him more than usual. Pain with flexion.  Patient has known arthritic changes of the acromioclavicular joint as well as the knees bilaterally.  Patient feels that work continues to aggravate both of these things.     Past Medical History:  Diagnosis Date   Anxiety    Arthritis    knees - [name] med   Cluster headache    Hx - resolved per patient   Depression    DVT of lower extremity (deep venous thrombosis) ([place]) 6/26-28/2013   Xarelto - resolved - history   HTN (hypertension)    currently [name] meds    Irritability and anger    PMH of   Kidney stone    passed stone, [name] surgery required.   Mild intermittent asthma without complication [id number]   Rotator cuff arthropathy 2007   right   Saddle pulmonary embolus ([place]) 6/26-28/2013   post orthopedic surgery - resolved   Sleep apnea 5/15   moderate OSA-has cpap but does not use it   Past Surgical History:  Procedure Laterality Date   arthroscopic knee surgery  10/09/2011   Dr Ninfa Linden   CARDIAC CATHETERIZATION  2007   LVdysfunction; normal coronaries   KNEE ARTHROSCOPY WITH MEDIAL MENISECTOMY Left 09/20/2014   medial menisectomy chondroplastytella medial plica excision  ;  Surgeon: Dorna Leitz, MD;  Location: [facility];  Service: Orthopedics;  Laterality: Left;   ROTATOR CUFF REPAIR  2007   right   UPPER GASTROINTESTINAL ENDOSCOPY  2007   normal   WISDOM TOOTH EXTRACTION     Social History   Socioeconomic History   Marital status: Married    Spouse name: Not on file   Number of children: 6   Years of education: 11   Highest education level: Not on file  Occupational History   Occupation: Glass blower/designer    Occupation: Programmer, systems  Tobacco Use   Smoking status: Some Days    Types: Cigars   Smokeless tobacco: Never   Tobacco comments:    every 3-4  months  Vaping Use   Vaping Use: Never used  Substance and Sexual Activity   Alcohol use: Yes    Alcohol/week: 0.0 standard drinks    Comment: drinks occasionally   Drug use: [name]   Sexual activity: Yes    Birth control/protection: Condom  Other Topics Concern   Not on file  Social History Narrative   Fun: Exercise   Social Determinants of Health   Financial Resource Strain: Not on file  Food Insecurity: Not on file  Transportation Needs: Not on file  Physical Activity: Not on file  Stress: Not on file  Social Connections: Not on file   Allergies  Allergen Reactions   Tramadol Other (See Comments)    insomnia   Family History  Problem  Relation Age of Onset   Cancer Mother        Mesothelioma    Diabetes Brother    Hypertension Brother    Stroke Brother 13   Heart attack Brother 31   Diabetes Maternal Aunt    Diabetes Maternal Grandmother    Heart disease Maternal Grandmother    Hypertension Maternal Grandmother    Stroke Maternal Grandmother        in 110s   Deep vein thrombosis Maternal Grandmother    Stroke Maternal Grandfather        > 32   Migraines Brother    Heart attack Brother 35   Healthy Father    Colon cancer Neg Hx    Esophageal cancer Neg Hx    Rectal cancer Neg Hx    Stomach cancer Neg Hx    Liver cancer Neg Hx    Pancreatic cancer Neg Hx    Other Neg Hx        low testosterone     Current Outpatient Medications (Cardiovascular):    indapamide (LOZOL) 1.25 MG tablet, Take 1 tablet (1.25 mg total) by mouth daily.   nebivolol (BYSTOLIC) 5 MG tablet, Take 1 tablet (5 mg total) by mouth daily.   rosuvastatin (CRESTOR) 10 MG tablet, Take 1 tablet (10 mg total) by mouth daily.   sildenafil (REVATIO) 20 MG tablet, Take 3 tablets (60 mg total) by mouth daily as needed.   tadalafil (CIALIS) 20 MG tablet, Take 1 tablet (20 mg total) by mouth daily as needed for erectile dysfunction.  Current Outpatient Medications (Respiratory):    albuterol (VENTOLIN HFA) 108 (90 Base) MCG/ACT inhaler, Inhale 1-2 puffs into the lungs every 6 (six) hours as needed for wheezing or shortness of breath.   fluticasone (FLONASE) 50 MCG/ACT nasal spray, Place 1 spray into both nostrils daily.   TRELEGY ELLIPTA 100-62.5-25 MCG/INH AEPB, Inhale 1 puff into the lungs daily.  Current Outpatient Medications (Analgesics):    meloxicam (MOBIC) 15 MG tablet, Take 1 tablet (15 mg total) by mouth daily.   oxyCODONE-acetaminophen (PERCOCET/ROXICET) 5-325 MG tablet, Take 1 tablet by mouth every 4 (four) hours as needed for severe pain.  Current Outpatient Medications (Hematological):    rivaroxaban (XARELTO) 10 MG TABS tablet, Take  1 tablet (10 mg total) by mouth daily.  Current Outpatient Medications (Other):    Armodafinil 250 MG tablet, Take 1 tablet (250 mg total) by mouth daily.   Cholecalciferol 50 MCG (2000 UT) TABS, Take 1 tablet (2,000 Units total) by mouth daily.   doxycycline (VIBRAMYCIN) 100 MG capsule, Take 1 capsule (100 mg total) by mouth 2 (two) times daily.   fluocinonide cream (LIDEX) [id number] %, Apply 1 application topically 2 (two) times  daily.   Multiple Vitamin (MULTIVITAMIN WITH MINERALS) TABS tablet, Take 1 tablet by mouth daily.   pantoprazole (PROTONIX) 40 MG tablet, Take 1 tablet (40 mg total) by mouth daily.   potassium chloride SA (KLOR-CON M20) 20 MEQ tablet, Take 1 tablet (20 mEq total) by mouth 2 (two) times daily.   Vitamin D, Ergocalciferol, (DRISDOL) 1.25 MG (50000 UNIT) CAPS capsule, TAKE 1 CAPSULE (50,000 UNITS TOTAL) BY MOUTH EVERY 7 (SEVEN) DAYS.   omeprazole (PRILOSEC) 40 MG capsule, Take 1 capsule (40 mg total) by mouth 2 (two) times daily for 14 days.   Reviewed prior external information including notes and imaging from  primary care provider As well as notes that were available from care everywhere and other healthcare systems.  Past medical history, social, surgical and family history all reviewed in electronic medical record.  [name] pertanent information unless stated regarding to the chief complaint.   Review of Systems:  [name] headache, visual changes, nausea, vomiting, diarrhea, constipation, dizziness, abdominal pain, skin rash, fevers, chills, night sweats, weight loss, swollen lymph nodes, body aches, joint swelling, chest pain, shortness of breath, mood changes. POSITIVE muscle aches  Objective  Blood pressure 138/86, pulse 73, height '5\' 7"'$  (1.702 m), weight 279 lb (126.6 kg), SpO2 96 %.   General: [name] apparent distress alert and oriented x3 mood and affect normal, dressed appropriately.  HEENT: Pupils equal, extraocular movements intact  Respiratory: Patient's speak in full  sentences and does not appear short of breath  Cardiovascular: [name] lower extremity edema, non tender, [name] erythema  Gait antalgic gait Knee exams bilaterally show significant arthritic changes mostly of the medial compartment.  [name] instability noted valgus and varus. Right shoulder exam shows the patient does have tenderness to palpation of the acromioclavicular joint.  Small effusion noted.  Positive crossover.  After informed written and verbal consent, patient was seated on exam table. Right knee was prepped with alcohol swab and utilizing anterolateral approach, patient's right knee space was injected with 4:1  marcaine 0.5%: Kenalog '40mg'$ /dL. Patient tolerated the procedure well without immediate complications.  After informed written and verbal consent, patient was seated on exam table. Left knee was prepped with alcohol swab and utilizing anterolateral approach, patient's left knee space was injected with 4:1  marcaine 0.5%: Kenalog '40mg'$ /dL. Patient tolerated the procedure well without immediate complications.  Procedure: Real-time Ultrasound Guided Injection of right acromioclavicular joint Device: GE Logiq Q7 Ultrasound guided injection is preferred based studies that show increased duration, increased effect, greater accuracy, decreased procedural pain, increased response rate, and decreased cost with ultrasound guided versus blind injection.  Verbal informed consent obtained.  Time-out conducted.  Noted [name] overlying erythema, induration, or other signs of local infection.  Skin prepped in a sterile fashion.  Local anesthesia: Topical Ethyl chloride.  With sterile technique and under real time ultrasound guidance: With a 25-gauge half inch needle injected with 0.5 cc of 0.5% Marcaine and 0.5 cc of Kenalog 40 mg/mL Completed without difficulty  Pain immediately resolved suggesting accurate placement of the medication.  Advised to call if fevers/chills, erythema, induration, drainage, or  persistent bleeding.  Impression: Technically successful ultrasound guided injection.    Impression and Recommendations:     The above documentation has been reviewed and is accurate and complete Lyndal Pulley, DO

## 2021-01-22 ENCOUNTER — Ambulatory Visit (INDEPENDENT_AMBULATORY_CARE_PROVIDER_SITE_OTHER): Payer: BC Managed Care – PPO | Admitting: Gastroenterology

## 2021-01-22 ENCOUNTER — Encounter: Payer: Self-pay | Admitting: Family Medicine

## 2021-01-22 ENCOUNTER — Other Ambulatory Visit: Payer: Self-pay

## 2021-01-22 ENCOUNTER — Other Ambulatory Visit: Payer: BC Managed Care – PPO

## 2021-01-22 ENCOUNTER — Telehealth: Payer: Self-pay

## 2021-01-22 ENCOUNTER — Encounter: Payer: Self-pay | Admitting: Internal Medicine

## 2021-01-22 ENCOUNTER — Ambulatory Visit (INDEPENDENT_AMBULATORY_CARE_PROVIDER_SITE_OTHER): Payer: BC Managed Care – PPO | Admitting: Family Medicine

## 2021-01-22 ENCOUNTER — Ambulatory Visit: Payer: Self-pay

## 2021-01-22 ENCOUNTER — Encounter: Payer: Self-pay | Admitting: Gastroenterology

## 2021-01-22 ENCOUNTER — Other Ambulatory Visit: Payer: Self-pay | Admitting: Internal Medicine

## 2021-01-22 VITALS — BP 138/86 | HR 73 | Ht 67.0 in | Wt 279.0 lb

## 2021-01-22 VITALS — BP 140/90 | HR 93 | Ht 67.0 in | Wt 279.0 lb

## 2021-01-22 DIAGNOSIS — G8929 Other chronic pain: Secondary | ICD-10-CM | POA: Diagnosis not present

## 2021-01-22 DIAGNOSIS — M19011 Primary osteoarthritis, right shoulder: Secondary | ICD-10-CM | POA: Diagnosis not present

## 2021-01-22 DIAGNOSIS — M25511 Pain in right shoulder: Secondary | ICD-10-CM

## 2021-01-22 DIAGNOSIS — Z8619 Personal history of other infectious and parasitic diseases: Secondary | ICD-10-CM

## 2021-01-22 DIAGNOSIS — R1013 Epigastric pain: Secondary | ICD-10-CM

## 2021-01-22 DIAGNOSIS — M170 Bilateral primary osteoarthritis of knee: Secondary | ICD-10-CM | POA: Diagnosis not present

## 2021-01-22 DIAGNOSIS — Z86010 Personal history of colonic polyps: Secondary | ICD-10-CM

## 2021-01-22 DIAGNOSIS — Z7901 Long term (current) use of anticoagulants: Secondary | ICD-10-CM

## 2021-01-22 DIAGNOSIS — Z791 Long term (current) use of non-steroidal anti-inflammatories (NSAID): Secondary | ICD-10-CM

## 2021-01-22 DIAGNOSIS — K2100 Gastro-esophageal reflux disease with esophagitis, without bleeding: Secondary | ICD-10-CM

## 2021-01-22 MED ORDER — SUPREP BOWEL PREP KIT 17.5-3.13-1.6 GM/177ML PO SOLN: 1.0000 | ORAL | 0 refills | Status: DC

## 2021-01-22 MED ORDER — PANTOPRAZOLE SODIUM 40 MG PO TBEC: 40.0000 mg | DELAYED_RELEASE_TABLET | Freq: Every day | ORAL | 5 refills | Status: DC

## 2021-01-22 NOTE — Assessment & Plan Note (Signed)
Chronic problem with worsening symptoms.  Patient has failed conservative therapy.  We will see if we can get approval for viscosupplementation.  Significant arthritic changes.  Continues to try to be active.  Patient is not a candidate until patient has a BMI of less than 40 for any type of knee replacement.  Follow-up with me again 6 weeks

## 2021-01-22 NOTE — Assessment & Plan Note (Signed)
Chronic problem with exacerbation.  Discussed with patient at great length.  Still wants to avoid any surgical intervention.  Patient does do a lot of overhead activity.  Patient will try to watch the lifting mechanics.  Increase activity slowly.  Follow-up again 6 to 8 weeks

## 2021-01-22 NOTE — Progress Notes (Signed)
I agree with the above note, plan 

## 2021-01-22 NOTE — Patient Instructions (Signed)
Tart cherry extract '1200mg'$  at night See me again in 5-6 weeks

## 2021-01-22 NOTE — Patient Instructions (Addendum)
It was my pleasure to provide care to you today. Based on our discussion, I am providing you with my recommendations below:  RECOMMENDATION(S):   Stop Meloxicam  PRESCRIPTION MEDICATION(S):   We have sent the following medication(s) to your pharmacy:  Pantoprazole  NOTE: If your medication(s) requires a PRIOR AUTHORIZATION, we will receive notification from your pharmacy. Once received, the process to submit for approval may take up to 7-10 business days. You will be contacted about any denials we have received from your insurance company as well as alternatives recommended by your provider.   LABS:   Please proceed to the basement level for lab work before leaving today. Press "B" on the elevator. The lab is located at the first door on the left as you exit the elevator.  HEALTHCARE LAWS AND MY CHART RESULTS:   Due to recent changes in healthcare laws, you may see results of your imaging and/or laboratory studies on MyChart before I have had a chance to review them.  I understand that in some cases there may be results that are confusing or concerning to you. Please understand that not all results are received at same time and often I may need to interpret multiple results in order to provide you with the best plan of care or course of treatment. Therefore, I ask that you please give me 48 hours to thoroughly review all your results before contacting my office for clarification.   COLONOSCOPY:   You have been scheduled for a colonoscopy. Please follow written instructions given to you at your visit today.   PREP:   Please pick up your prep supplies at the pharmacy within the next 1-3 days.  INHALERS:   If you use inhalers (even only as needed), please bring them with you on the day of your procedure.  MEDICATIONS TO HOLD:  We will contact your provider to request permission for you to hold XARELTO. Once we receive a response, you will be contacted by our office. If you do not  hear from our office 1 week prior to your scheduled procedure, please contact our office at [phone].   COLONOSCOPY TIPS:  To reduce nausea and dehydration, stay well hydrated for 3-4 days prior to the exam.  To prevent skin/hemorrhoid irritation - prior to wiping, put A&Dointment or vaseline on the toilet paper. Keep a towel or pad on the bed.  BEFORE STARTING YOUR PREP, drink  64oz of clear liquids in the morning. This will help to flush the colon and will ensure you are well hydrated!!!!  NOTE - This is in addition to the fluids required for to complete your prep. Use of a flavored hard candy, such as grape Anise Salvo, can counteract some of the flavor of the prep and may prevent some nausea.   FOLLOW UP:  After your procedure, you will receive a call from my office staff regarding my recommendation for follow up.  BMI:  If you are age 55 or younger, your body mass index should be between 19-25. Your There is no height or weight on file to calculate BMI. If this is out of the aformentioned range listed, please consider follow up with your Primary Care Provider.   MY CHART:  The  GI providers would like to encourage you to use [facility] to communicate with providers for non-urgent requests or questions.  Due to long hold times on the telephone, sending your provider a message by [facility] may be a faster and more efficient way  to get a response.  Please allow 48 business hours for a response.  Please remember that this is for non-urgent requests.   Thank you for trusting me with your gastrointestinal care!    Alonza Bogus PA-C

## 2021-01-22 NOTE — Telephone Encounter (Addendum)
   Name: [name]  DOB: Jan 08, 1966  MRN: [id number]   Primary Cardiologist: Kirk Ruths, MD  Chart reviewed as part of pre-operative protocol coverage. Patient was contacted 01/22/2021 in reference to pre-operative risk assessment for pending surgery as outlined below.  [name] was last seen on 11/20/20 by Dr. Stanford Breed, history reviewed. I reached out to patient for update on how he is doing. The patient affirms he has been doing well without any new cardiac symptoms. He has chronic atypical chest discomfort that is unchanged. Therefore, based on ACC/AHA guidelines, the patient would be at acceptable risk for the planned procedure without further cardiovascular testing. The patient was advised that if he develops new symptoms prior to surgery to contact our office to arrange for a follow-up visit, and he verbalized understanding.  Regarding anticoagulation, he is not on this for cardiac reasons and we do not prescribe it/manage this for him. He is listed as having history of DVT and this is managed through his primary care doctor so would recommend that the requesting team reach out to them for input on holding.  I will route this recommendation to the requesting party via Epic fax function and remove from pre-op pool. Please call with questions.  Charlie Pitter, PA-C 01/22/2021, 2:28 PM

## 2021-01-22 NOTE — Progress Notes (Addendum)
01/22/2021 [name] [id number] 02/11/1966   HISTORY OF PRESENT ILLNESS: This is a 55 year old male who is a patient of Dr. Ardis Hughs.  He is here for evaluation regarding complaints of abdominal pain.  He reports that he was having epigastric abdominal pain earlier this month.  He went to the emergency department and CT scan of the abdomen and pelvis with contrast showed the following:  IMPRESSION: 1. No CT evidence for acute intra-abdominal or pelvic abnormality. 2. Nonobstructing left kidney stone 3. Diverticular disease of the left colon without acute inflammatory change  CBC, CMP, lipase, fecal occult blood test were all unremarkable.  They ended up diagnosing him with cellulitis and placed him on antibiotics.  He said that his pain is better, but is still there.  He thought he might have a hernia in his bellybutton.  He is due for colonoscopy since the last was in July 2017 and he had tubular adenoma removed.  Repeat was recommended in 5 years.  He was diagnosed with H. pylori previously and was treated last summer, but he never returned for the H. pylori stool antigen to confirm eradication.  Currently takes meloxicam daily for the past year or more for foot pain although he says his foot really has not been bothering him much lately so he could definitely try to discontinue that.  He is not currently on PPI therapy.  He is on Xarelto for history of DVT/PE (saddle PE) in 2013.  Past Medical History:  Diagnosis Date   Anxiety    Arthritis    knees - no med   Cluster headache    Hx - resolved per patient   Depression    DVT of lower extremity (deep venous thrombosis) ([place]) 6/26-28/2013   Xarelto - resolved - history   HTN (hypertension)    currently no meds   Irritability and anger    PMH of   Kidney stone    passed stone, no surgery required.   Mild intermittent asthma without complication 6/97/9480   Rotator cuff arthropathy 2007   right   Saddle pulmonary  embolus ([place]) 6/26-28/2013   post orthopedic surgery - resolved   Sleep apnea 5/15   moderate OSA-has cpap but does not use it   Past Surgical History:  Procedure Laterality Date   arthroscopic knee surgery  10/09/2011   Dr Ninfa Linden   CARDIAC CATHETERIZATION  2007   LVdysfunction; normal coronaries   KNEE ARTHROSCOPY WITH MEDIAL MENISECTOMY Left 09/20/2014   medial menisectomy chondroplastytella medial plica excision  ;  Surgeon: Dorna Leitz, MD;  Location: [facility];  Service: Orthopedics;  Laterality: Left;   ROTATOR CUFF REPAIR  2007   right   UPPER GASTROINTESTINAL ENDOSCOPY  2007   normal   WISDOM TOOTH EXTRACTION      reports that he has been smoking cigars. He has never used smokeless tobacco. He reports current alcohol use. He reports that he does not use drugs. family history includes Cancer in his mother; Deep vein thrombosis in his maternal grandmother; Diabetes in his brother, maternal aunt, and maternal grandmother; Healthy in his father; Heart attack (age of onset: 52) in his brother; Heart attack (age of onset: 72) in his brother; Heart disease in his maternal grandmother; Hypertension in his brother and maternal grandmother; Migraines in his brother; Stroke in his maternal grandfather and maternal grandmother; Stroke (age of onset: 38) in his brother. Allergies  Allergen Reactions   Tramadol Other (See Comments)  insomnia      Outpatient Encounter Medications as of 01/22/2021  Medication Sig   albuterol (VENTOLIN HFA) 108 (90 Base) MCG/ACT inhaler Inhale 1-2 puffs into the lungs every 6 (six) hours as needed for wheezing or shortness of breath.   Armodafinil 250 MG tablet Take 1 tablet (250 mg total) by mouth daily.   Cholecalciferol 50 MCG (2000 UT) TABS Take 1 tablet (2,000 Units total) by mouth daily.   Multiple Vitamin (MULTIVITAMIN WITH MINERALS) TABS tablet Take 1 tablet by mouth daily.   nebivolol (BYSTOLIC) 5 MG tablet Take 1 tablet (5 mg total)  by mouth daily.   potassium chloride SA (KLOR-CON M20) 20 MEQ tablet Take 1 tablet (20 mEq total) by mouth 2 (two) times daily.   rivaroxaban (XARELTO) 10 MG TABS tablet Take 1 tablet (10 mg total) by mouth daily.   rosuvastatin (CRESTOR) 10 MG tablet Take 1 tablet (10 mg total) by mouth daily.   sildenafil (REVATIO) 20 MG tablet Take 3 tablets (60 mg total) by mouth daily as needed.   tadalafil (CIALIS) 20 MG tablet Take 1 tablet (20 mg total) by mouth daily as needed for erectile dysfunction.   TRELEGY ELLIPTA 100-62.5-25 MCG/INH AEPB Inhale 1 puff into the lungs daily.   Vitamin D, Ergocalciferol, (DRISDOL) 1.25 MG (50000 UNIT) CAPS capsule TAKE 1 CAPSULE (50,000 UNITS TOTAL) BY MOUTH EVERY 7 (SEVEN) DAYS.   [DISCONTINUED] meloxicam (MOBIC) 15 MG tablet Take 1 tablet (15 mg total) by mouth daily.   [DISCONTINUED] pantoprazole (PROTONIX) 40 MG tablet Take 1 tablet (40 mg total) by mouth daily.   [DISCONTINUED] SUPREP BOWEL PREP KIT 17.5-3.13-1.6 GM/177ML SOLN Take 1 kit by mouth as directed. For colonoscopy prep BIN: [id number] PCN: CN GROUP: [id number] ID: [id number]   pantoprazole (PROTONIX) 40 MG tablet Take 1 tablet (40 mg total) by mouth daily.   SUPREP BOWEL PREP KIT 17.5-3.13-1.6 GM/177ML SOLN Take 1 kit by mouth as directed.   [DISCONTINUED] doxycycline (VIBRAMYCIN) 100 MG capsule Take 1 capsule (100 mg total) by mouth 2 (two) times daily.   [DISCONTINUED] fluocinonide cream (LIDEX) 8.32 % Apply 1 application topically 2 (two) times daily.   [DISCONTINUED] fluticasone (FLONASE) 50 MCG/ACT nasal spray Place 1 spray into both nostrils daily.   [DISCONTINUED] indapamide (LOZOL) 1.25 MG tablet Take 1 tablet (1.25 mg total) by mouth daily.   [DISCONTINUED] omeprazole (PRILOSEC) 40 MG capsule Take 1 capsule (40 mg total) by mouth 2 (two) times daily for 14 days.   [DISCONTINUED] oxyCODONE-acetaminophen (PERCOCET/ROXICET) 5-325 MG tablet Take 1 tablet by mouth every 4 (four) hours as needed for  severe pain.   No facility-administered encounter medications on file as of 01/22/2021.    REVIEW OF SYSTEMS  : All other systems reviewed and negative except where noted in the History of Present Illness.   PHYSICAL EXAM: BP 140/90   Pulse 93   Ht _0  (1.702 m)   Wt 279 lb (126.6 kg)   BMI 43.70 kg/m  General: Well developed AA male in no acute distress Head: Normocephalic and atraumatic Eyes:  Sclerae anicteric, conjunctiva pink. Ears: Normal auditory acuity Lungs: Clear throughout to auscultation; no W/R/R. Heart: Regular rate and rhythm; no M/R/G. Abdomen: Soft, non-distended.  BS present.  Non-tender. Rectal:  Will be done at the time of colonoscopy. Musculoskeletal: Symmetrical with no gross deformities  Skin: No lesions on visible extremities Extremities: No edema  Neurological: Alert oriented x 4, grossly non-focal Psychological:  Alert and cooperative. Normal mood and affect  ASSESSMENT AND PLAN: *Personal history of colon polyp: Last colonoscopy was in July 2017 at which time he had tubular adenoma removed.  Repeat was recommended a 5-year interval.  We will schedule that with Dr. Ardis Hughs.  The risks, benefits, and alternatives to colonoscopy were discussed with the patient and he consents to proceed.  *GERD, epigastric abdominal pain, NSAID use, history of H. Pylori: Was treated for H. pylori last summer, but he never returned for stool antigen.  We will have him perform that to check for eradication.  He is on meloxicam daily for the past year or more.  I have asked him to discontinue that.  After he collects the stool study we will restart him on pantoprazole 40 mg daily.  Prescription sent to pharmacy.  Recent CT scan unremarkable. *Chronic anticoagulation with Xarelto for history of DVT and PE (saddle PE in 2013):  Will hold Xarelto for 1 day prior to endoscopic procedures - will instruct when and how to resume after procedure. Benefits and risks of procedure explained  including risks of bleeding, perforation, infection, missed lesions, reactions to medications and possible need for hospitalization and surgery for complications. Additional rare but real risk of stroke or other vascular clotting events off Xarelto also explained and need to seek urgent help if any signs of these problems occur. Will communicate by phone or EMR with patient's prescribing provider to confirm that holding Xarelto is reasonable in this case.     CC:  [name], MD

## 2021-01-22 NOTE — Telephone Encounter (Signed)
[facility] Pre-operative Risk Assessment     [name] 08/03/1965 [id number]  Procedure: Colonoscopy Anesthesia type:  MAC Procedure Date: 01/25/21 Provider: Dr. Ardis Hughs  Type of Clearance needed: Pharmacy  Medication(s) needing held: Xarelto   Length of time for medication to be held: 1-2 days  Please review request and advise by either responding to this message or by sending your response to the fax # provided below.  Thank you,  [facility]  Phone: [phone] Fax: [phone] ATTENTION: [name], LPN

## 2021-01-23 DIAGNOSIS — G4733 Obstructive sleep apnea (adult) (pediatric): Secondary | ICD-10-CM | POA: Diagnosis not present

## 2021-01-23 LAB — HELICOBACTER PYLORI  SPECIAL ANTIGEN
MICRO NUMBER:: 12279633
SPECIMEN QUALITY: ADEQUATE

## 2021-01-23 NOTE — Telephone Encounter (Signed)
Letter by [name], MD on 01/22/2021 (Status: Sent)     Therapist, music at [facility] [place] [place],   [id number] Phone:  [phone]   Fax:  [phone]   January 22, 2021    Patient: [name]  Date of Birth: 1966/02/23  Date of Visit: 01/22/2021      To Whom It May Concern:   It is my medical opinion that Ackley Brixius can stop xarelto 2 days prior to GI procedure and restart the following day if there are no bleeding complications.   If you have any questions or concerns, please don't hesitate to call.   Sincerely,       [name], MD   Called pt to inform about above response. LVM requesting returned call.

## 2021-01-23 NOTE — Telephone Encounter (Signed)
FOURTH ATTEMPT:  Called pt and informed him about instructions below. Verbalized acceptance and understanding.

## 2021-01-23 NOTE — Telephone Encounter (Signed)
THIRD ATTEMPT:  Again LVM requesting returned call.

## 2021-01-23 NOTE — Telephone Encounter (Signed)
SECOND ATTEMPT:  Called pt to make him aware of response below. LVM requesting returned call.

## 2021-01-25 ENCOUNTER — Other Ambulatory Visit: Payer: Self-pay

## 2021-01-25 ENCOUNTER — Encounter: Payer: BC Managed Care – PPO | Admitting: Gastroenterology

## 2021-01-25 ENCOUNTER — Encounter: Payer: Self-pay | Admitting: Gastroenterology

## 2021-01-25 ENCOUNTER — Ambulatory Visit (AMBULATORY_SURGERY_CENTER): Payer: BC Managed Care – PPO | Admitting: Gastroenterology

## 2021-01-25 VITALS — BP 124/65 | HR 70 | Temp 95.7°F | Resp 14 | Ht 67.0 in | Wt 279.0 lb

## 2021-01-25 DIAGNOSIS — G4733 Obstructive sleep apnea (adult) (pediatric): Secondary | ICD-10-CM | POA: Diagnosis not present

## 2021-01-25 DIAGNOSIS — K635 Polyp of colon: Secondary | ICD-10-CM

## 2021-01-25 DIAGNOSIS — D122 Benign neoplasm of ascending colon: Secondary | ICD-10-CM

## 2021-01-25 DIAGNOSIS — Z86010 Personal history of colonic polyps: Secondary | ICD-10-CM

## 2021-01-25 DIAGNOSIS — D125 Benign neoplasm of sigmoid colon: Secondary | ICD-10-CM

## 2021-01-25 DIAGNOSIS — Z1211 Encounter for screening for malignant neoplasm of colon: Secondary | ICD-10-CM | POA: Diagnosis not present

## 2021-01-25 MED ORDER — SODIUM CHLORIDE 0.9 % IV SOLN: 500.0000 mL | Freq: Once | INTRAVENOUS | Status: DC

## 2021-01-25 NOTE — Patient Instructions (Signed)
YOU HAD AN ENDOSCOPIC PROCEDURE TODAY AT THE [facility]:   Refer to the procedure report that was given to you for any specific questions about what was found during the examination.  If the procedure report does not answer your questions, please call your gastroenterologist to clarify.  If you requested that your care partner not be given the details of your procedure findings, then the procedure report has been included in a sealed envelope for you to review at your convenience later. ° °**Handouts given on polyps and diverticulosis** ° °YOU SHOULD EXPECT: Some feelings of bloating in the abdomen. Passage of more gas than usual.  Walking can help get rid of the air that was put into your GI tract during the procedure and reduce the bloating. If you had a lower endoscopy (such as a colonoscopy or flexible sigmoidoscopy) you may notice spotting of blood in your stool or on the toilet paper. If you underwent a bowel prep for your procedure, you may not have a normal bowel movement for a few days. ° °Please Note:  You might notice some irritation and congestion in your nose or some drainage.  This is from the oxygen used during your procedure.  There is no need for concern and it should clear up in a day or so. ° °SYMPTOMS TO REPORT IMMEDIATELY: ° °Following lower endoscopy (colonoscopy or flexible sigmoidoscopy): ° Excessive amounts of blood in the stool ° Significant tenderness or worsening of abdominal pains ° Swelling of the abdomen that is new, acute ° Fever of 100°F or higher ° °For urgent or emergent issues, a gastroenterologist can be reached at any hour by calling [phone]. °Do not use MyChart messaging for urgent concerns.  ° ° °DIET:  We do recommend a small meal at first, but then you may proceed to your regular diet.  Drink plenty of fluids but you should avoid alcoholic beverages for 24 hours. ° °ACTIVITY:  You should plan to take it easy for the rest of today and you should NOT DRIVE  or use heavy machinery until tomorrow (because of the sedation medicines used during the test).   ° °FOLLOW UP: °Our staff will call the number listed on your records 48-72 hours following your procedure to check on you and address any questions or concerns that you may have regarding the information given to you following your procedure. If we do not reach you, we will leave a message.  We will attempt to reach you two times.  During this call, we will ask if you have developed any symptoms of COVID 19. If you develop any symptoms (ie: fever, flu-like symptoms, shortness of breath, cough etc.) before then, please call [phone].  If you test positive for Covid 19 in the 2 weeks post procedure, please call and report this information to us.   ° °If any biopsies were taken you will be contacted by phone or by letter within the next 1-3 weeks.  Please call us at [phone] if you have not heard about the biopsies in 3 weeks.  ° ° °SIGNATURES/CONFIDENTIALITY: °You and/or your care partner have signed paperwork which will be entered into your electronic medical record.  These signatures attest to the fact that that the information above on your After Visit Summary has been reviewed and is understood.  Full responsibility of the confidentiality of this discharge information lies with you and/or your care-partner.  °

## 2021-01-25 NOTE — Progress Notes (Signed)
Report to PACU, RN, vss, BBS= Clear.  

## 2021-01-25 NOTE — Progress Notes (Signed)
HPI: This is a man with personal h/o adenomatous polyp   ROS: complete GI ROS as described in HPI, all other review negative.  Constitutional:  No unintentional weight loss   Past Medical History:  Diagnosis Date   Anxiety    Arthritis    knees - no med   Cluster headache    Hx - resolved per patient   Depression    DVT of lower extremity (deep venous thrombosis) ([place]) 6/26-28/2013   Xarelto - resolved - history   HTN (hypertension)    currently no meds   Irritability and anger    PMH of   Kidney stone    passed stone, no surgery required.   Mild intermittent asthma without complication [id number]   Rotator cuff arthropathy 2007   right   Saddle pulmonary embolus ([place]) 6/26-28/2013   post orthopedic surgery - resolved   Sleep apnea 5/15   moderate OSA-has cpap but does not use it    Past Surgical History:  Procedure Laterality Date   arthroscopic knee surgery  10/09/2011   Dr Ninfa Linden   CARDIAC CATHETERIZATION  06/02/2005   LVdysfunction; normal coronaries   COLONOSCOPY     KNEE ARTHROSCOPY WITH MEDIAL MENISECTOMY Left 09/20/2014   medial menisectomy chondroplastytella medial plica excision  ;  Surgeon: Dorna Leitz, MD;  Location: [facility];  Service: Orthopedics;  Laterality: Left;   ROTATOR CUFF REPAIR  06/02/2005   right   UPPER GASTROINTESTINAL ENDOSCOPY  06/02/2005   normal   WISDOM TOOTH EXTRACTION      Current Outpatient Medications  Medication Sig Dispense Refill   albuterol (VENTOLIN HFA) 108 (90 Base) MCG/ACT inhaler Inhale 1-2 puffs into the lungs every 6 (six) hours as needed for wheezing or shortness of breath. 25.5 g 3   Cholecalciferol 50 MCG (2000 UT) TABS Take 1 tablet (2,000 Units total) by mouth daily. 90 tablet 1   Multiple Vitamin (MULTIVITAMIN WITH MINERALS) TABS tablet Take 1 tablet by mouth daily.     potassium chloride SA (KLOR-CON M20) 20 MEQ tablet Take 1 tablet (20 mEq total) by mouth 2 (two) times daily. 180 tablet 0    rivaroxaban (XARELTO) 10 MG TABS tablet Take 1 tablet (10 mg total) by mouth daily. 90 tablet 1   rosuvastatin (CRESTOR) 10 MG tablet Take 1 tablet (10 mg total) by mouth daily. 90 tablet 1   sildenafil (REVATIO) 20 MG tablet Take 3 tablets (60 mg total) by mouth daily as needed. 60 tablet 5   tadalafil (CIALIS) 20 MG tablet Take 1 tablet (20 mg total) by mouth daily as needed for erectile dysfunction. 6 tablet 5   Vitamin D, Ergocalciferol, (DRISDOL) 1.25 MG (50000 UNIT) CAPS capsule TAKE 1 CAPSULE (50,000 UNITS TOTAL) BY MOUTH EVERY 7 (SEVEN) DAYS. 12 capsule 0   Armodafinil 250 MG tablet Take 1 tablet (250 mg total) by mouth daily. 90 tablet 1   nebivolol (BYSTOLIC) 5 MG tablet Take 1 tablet (5 mg total) by mouth daily. 90 tablet 1   nitroGLYCERIN (NITROSTAT) 0.3 MG SL tablet Place 0.3 mg under the tongue 2 (two) times daily as needed.     pantoprazole (PROTONIX) 40 MG tablet Take 1 tablet (40 mg total) by mouth daily. 30 tablet 5   TRELEGY ELLIPTA 100-62.5-25 MCG/INH AEPB Inhale 1 puff into the lungs daily. 3 each 1   Current Facility-Administered Medications  Medication Dose Route Frequency Provider Last Rate Last Admin   0.9 %  sodium chloride infusion  500 mL Intravenous Once Milus Banister, MD        Allergies as of 01/25/2021 - Review Complete 01/25/2021  Allergen Reaction Noted   Tramadol Other (See Comments) 10/05/2015    Family History  Problem Relation Age of Onset   Cancer Mother        Mesothelioma    Diabetes Brother    Hypertension Brother    Stroke Brother 23   Heart attack Brother 14   Diabetes Maternal Aunt    Diabetes Maternal Grandmother    Heart disease Maternal Grandmother    Hypertension Maternal Grandmother    Stroke Maternal Grandmother        in 44s   Deep vein thrombosis Maternal Grandmother    Stroke Maternal Grandfather        > 32   Migraines Brother    Heart attack Brother 93   Healthy Father    Colon cancer Neg Hx    Esophageal cancer  Neg Hx    Rectal cancer Neg Hx    Stomach cancer Neg Hx    Liver cancer Neg Hx    Pancreatic cancer Neg Hx    Other Neg Hx        low testosterone    Social History   Socioeconomic History   Marital status: Married    Spouse name: Not on file   Number of children: 6   Years of education: 11   Highest education level: Not on file  Occupational History   Occupation: Glass blower/designer    Occupation: Programmer, systems  Tobacco Use   Smoking status: Some Days    Types: Cigars   Smokeless tobacco: Never   Tobacco comments:    every 3-4 months  Vaping Use   Vaping Use: Never used  Substance and Sexual Activity   Alcohol use: Yes    Alcohol/week: 0.0 standard drinks    Comment: drinks occasionally   Drug use: No   Sexual activity: Yes    Birth control/protection: Condom  Other Topics Concern   Not on file  Social History Narrative   Fun: Exercise   Social Determinants of Health   Financial Resource Strain: Not on file  Food Insecurity: Not on file  Transportation Needs: Not on file  Physical Activity: Not on file  Stress: Not on file  Social Connections: Not on file  Intimate Partner Violence: Not on file     Physical Exam: BP 131/75   Pulse 68   Temp (!) 95.7 F (35.4 C) (Temporal)   Ht '5\' 7"'$  (1.702 m)   Wt 279 lb (126.6 kg)   SpO2 98%   BMI 43.70 kg/m  Constitutional: generally well-appearing Psychiatric: alert and oriented x3 Lungs: CTA bilaterally Heart: no MCR  Assessment and plan: 55 y.o. male with h/o colon polyps  Colonoscopy today, he stopped blood thinner 2-3 days ago.  Care is appropriate for the ambulatory setting.  Owens Loffler, MD [facility] 01/25/2021, 2:01 PM

## 2021-01-25 NOTE — Progress Notes (Signed)
Called to room to assist during endoscopic procedure.  Patient ID and intended procedure confirmed with present staff. Received instructions for my participation in the procedure from the performing physician.  

## 2021-01-25 NOTE — Progress Notes (Signed)
VS completed by DT.  Pt's states no medical or surgical changes since previsit or office visit.  

## 2021-01-25 NOTE — Op Note (Addendum)
[place] Patient Name: [name] Procedure Date: 01/25/2021 2:03 PM MRN: [id number] Endoscopist: Milus Banister , MD Age: 55 Referring MD:  Date of Birth: [birth year] Gender: Male Account #: [id number] Procedure:                Colonoscopy Indications:              High risk colon cancer surveillance: Personal                            history of colonic polyps; colonoscopy 2017 single                            subCM adenoma removed Medicines:                Monitored Anesthesia Care Procedure:                Pre-Anesthesia Assessment:                           - Prior to the procedure, a History and Physical                            was performed, and patient medications and                            allergies were reviewed. The patient's tolerance of                            previous anesthesia was also reviewed. The risks                            and benefits of the procedure and the sedation                            options and risks were discussed with the patient.                            All questions were answered, and informed consent                            was obtained. Prior Anticoagulants: The patient has                            taken Xarelto (rivaroxaban), last dose was 2 days                            prior to procedure. ASA Grade Assessment: III - A                            patient with severe systemic disease. After                            reviewing the risks and benefits, the patient was  deemed in satisfactory condition to undergo the                            procedure.                           After obtaining informed consent, the colonoscope                            was passed under direct vision. Throughout the                            procedure, the patient's blood pressure, pulse, and                            oxygen saturations were monitored continuously. The                             Olympus CF-HQ190L ([id number]) Colonoscope was                            introduced through the anus and advanced to the the                            cecum, identified by appendiceal orifice and                            ileocecal valve. The colonoscopy was performed                            without difficulty. The patient tolerated the                            procedure well. The quality of the bowel                            preparation was good. The ileocecal valve,                            appendiceal orifice, and rectum were photographed. Scope In: 2:11:36 PM Scope Out: 2:21:09 PM Scope Withdrawal Time: 0 hours 7 minutes 24 seconds  Total Procedure Duration: 0 hours 9 minutes 33 seconds  Findings:                 Two sessile polyps were found in the sigmoid colon                            and ascending colon. The polyps were 2 to 3 mm in                            size. These polyps were removed with a cold snare.                            Resection and retrieval were complete.  Multiple small and large-mouthed diverticula were                            found in the left colon.                           The exam was otherwise without abnormality on                            direct and retroflexion views. Complications:            No immediate complications. Estimated blood loss:                            None. Estimated Blood Loss:     Estimated blood loss: none. Impression:               - Two 2 to 3 mm polyps in the sigmoid colon and in                            the ascending colon, removed with a cold snare.                            Resected and retrieved.                           - Diverticulosis in the left colon.                           - The examination was otherwise normal on direct                            and retroflexion views. Recommendation:           - Patient has a contact number available for                             emergencies. The signs and symptoms of potential                            delayed complications were discussed with the                            patient. Return to normal activities tomorrow.                            Written discharge instructions were provided to the                            patient.                           - Resume previous diet.                           - Continue present medications. It is ok to resume  your bloodthinner tomorrow.                           - Await pathology results. Milus Banister, MD 01/25/2021 2:27:49 PM This report has been signed electronically.

## 2021-01-29 ENCOUNTER — Telehealth: Payer: Self-pay | Admitting: *Deleted

## 2021-01-29 ENCOUNTER — Telehealth: Payer: Self-pay

## 2021-01-29 NOTE — Telephone Encounter (Signed)
Attempted to reach patient for post-procedure f/u call. No answer. Left message for him to please not hesitate to call us if he has any questions/concerns regarding his care. 

## 2021-01-29 NOTE — Telephone Encounter (Signed)
Follow up call made. 

## 2021-02-02 ENCOUNTER — Encounter: Payer: Self-pay | Admitting: Gastroenterology

## 2021-02-07 ENCOUNTER — Ambulatory Visit: Payer: BC Managed Care – PPO | Admitting: Family Medicine

## 2021-02-10 ENCOUNTER — Other Ambulatory Visit: Payer: Self-pay | Admitting: Podiatry

## 2021-02-10 DIAGNOSIS — M170 Bilateral primary osteoarthritis of knee: Secondary | ICD-10-CM

## 2021-02-14 ENCOUNTER — Ambulatory Visit (INDEPENDENT_AMBULATORY_CARE_PROVIDER_SITE_OTHER): Payer: BC Managed Care – PPO | Admitting: Internal Medicine

## 2021-02-14 ENCOUNTER — Other Ambulatory Visit: Payer: Self-pay

## 2021-02-14 ENCOUNTER — Encounter: Payer: Self-pay | Admitting: Internal Medicine

## 2021-02-14 VITALS — BP 124/86 | HR 79 | Temp 97.9°F | Resp 16 | Ht 67.0 in | Wt 273.0 lb

## 2021-02-14 DIAGNOSIS — Z0001 Encounter for general adult medical examination with abnormal findings: Secondary | ICD-10-CM

## 2021-02-14 DIAGNOSIS — M170 Bilateral primary osteoarthritis of knee: Secondary | ICD-10-CM | POA: Diagnosis not present

## 2021-02-14 DIAGNOSIS — I10 Essential (primary) hypertension: Secondary | ICD-10-CM | POA: Diagnosis not present

## 2021-02-14 DIAGNOSIS — E559 Vitamin D deficiency, unspecified: Secondary | ICD-10-CM

## 2021-02-14 DIAGNOSIS — Z23 Encounter for immunization: Secondary | ICD-10-CM

## 2021-02-14 LAB — CBC WITH DIFFERENTIAL/PLATELET
Basophils Absolute: 0.1 10*3/uL (ref 0.0–0.1)
Basophils Relative: 0.4 % (ref 0.0–3.0)
Eosinophils Absolute: 0.3 10*3/uL (ref 0.0–0.7)
Eosinophils Relative: 2.4 % (ref 0.0–5.0)
HCT: 42.7 % (ref 39.0–52.0)
Hemoglobin: 14.2 g/dL (ref 13.0–17.0)
Lymphocytes Relative: 22.2 % (ref 12.0–46.0)
Lymphs Abs: 2.9 10*3/uL (ref 0.7–4.0)
MCHC: 33.2 g/dL (ref 30.0–36.0)
MCV: 88.3 fl (ref 78.0–100.0)
Monocytes Absolute: 1 10*3/uL (ref 0.1–1.0)
Monocytes Relative: 7.9 % (ref 3.0–12.0)
Neutro Abs: 8.7 10*3/uL — ABNORMAL HIGH (ref 1.4–7.7)
Neutrophils Relative %: 67.1 % (ref 43.0–77.0)
Platelets: 235 10*3/uL (ref 150.0–400.0)
RBC: 4.83 Mil/uL (ref 4.22–5.81)
RDW: 14.3 % (ref 11.5–15.5)
WBC: 13 10*3/uL — ABNORMAL HIGH (ref 4.0–10.5)

## 2021-02-14 LAB — VITAMIN D 25 HYDROXY (VIT D DEFICIENCY, FRACTURES): VITD: 42.2 ng/mL (ref 30.00–100.00)

## 2021-02-14 LAB — PSA: PSA: 0.27 ng/mL (ref 0.10–4.00)

## 2021-02-14 MED ORDER — TRAMADOL HCL 50 MG PO TABS
50.0000 mg | ORAL_TABLET | Freq: Four times a day (QID) | ORAL | 0 refills | Status: DC | PRN
Start: 2021-02-14 — End: 2021-09-07

## 2021-02-14 NOTE — Progress Notes (Signed)
Subjective:  Patient ID: [name], male    DOB: 08-04-1965  Age: 55 y.o. MRN: [id number]  CC: Annual Exam and Osteoarthritis  This visit occurred during the SARS-CoV-2 public health emergency.  Safety protocols were in place, including screening questions prior to the visit, additional usage of staff PPE, and extensive cleaning of exam room while observing appropriate contact time as indicated for disinfecting solutions.    HPI [name] presents for a CPX and f/up -   He complains of chronic pain in his large joints, especially his knees.  He tells me the pain interferes with his daily activities and his sleep.  He previously took tramadol and said it helped control the pain but he mistakenly thought it caused insomnia. He is active and denies chest pain, shortness of breath, diaphoresis, dizziness, lightheadedness, palpitations, or edema.  Outpatient Medications Prior to Visit  Medication Sig Dispense Refill   albuterol (VENTOLIN HFA) 108 (90 Base) MCG/ACT inhaler Inhale 1-2 puffs into the lungs every 6 (six) hours as needed for wheezing or shortness of breath. 25.5 g 3   Armodafinil 250 MG tablet Take 1 tablet (250 mg total) by mouth daily. 90 tablet 1   Cholecalciferol 50 MCG (2000 UT) TABS Take 1 tablet (2,000 Units total) by mouth daily. 90 tablet 1   Multiple Vitamin (MULTIVITAMIN WITH MINERALS) TABS tablet Take 1 tablet by mouth daily.     nebivolol (BYSTOLIC) 5 MG tablet Take 1 tablet (5 mg total) by mouth daily. 90 tablet 1   nitroGLYCERIN (NITROSTAT) 0.3 MG SL tablet Place 0.3 mg under the tongue 2 (two) times daily as needed.     pantoprazole (PROTONIX) 40 MG tablet Take 1 tablet (40 mg total) by mouth daily. 30 tablet 5   potassium chloride SA (KLOR-CON M20) 20 MEQ tablet Take 1 tablet (20 mEq total) by mouth 2 (two) times daily. 180 tablet 0   rivaroxaban (XARELTO) 10 MG TABS tablet Take 1 tablet (10 mg total) by mouth daily. 90 tablet 1   rosuvastatin  (CRESTOR) 10 MG tablet Take 1 tablet (10 mg total) by mouth daily. 90 tablet 1   sildenafil (REVATIO) 20 MG tablet Take 3 tablets (60 mg total) by mouth daily as needed. 60 tablet 5   tadalafil (CIALIS) 20 MG tablet Take 1 tablet (20 mg total) by mouth daily as needed for erectile dysfunction. 6 tablet 5   TRELEGY ELLIPTA 100-62.5-25 MCG/INH AEPB Inhale 1 puff into the lungs daily. 3 each 1   Vitamin D, Ergocalciferol, (DRISDOL) 1.25 MG (50000 UNIT) CAPS capsule TAKE 1 CAPSULE (50,000 UNITS TOTAL) BY MOUTH EVERY 7 (SEVEN) DAYS. 12 capsule 0   No facility-administered medications prior to visit.    ROS Review of Systems  Constitutional:  Negative for appetite change, chills, fatigue and fever.  HENT: Negative.    Eyes: Negative.   Respiratory:  Negative for cough, chest tightness, shortness of breath and wheezing.   Cardiovascular:  Negative for chest pain, palpitations and leg swelling.  Gastrointestinal:  Negative for abdominal pain, constipation, diarrhea, nausea and vomiting.  Endocrine: Negative.   Genitourinary: Negative.  Negative for difficulty urinating, scrotal swelling and testicular pain.  Musculoskeletal:  Positive for arthralgias. Negative for back pain, myalgias and neck pain.  Skin: Negative.  Negative for color change and pallor.  Neurological: Negative.  Negative for dizziness, weakness and light-headedness.  Hematological:  Negative for adenopathy. Does not bruise/bleed easily.  Psychiatric/Behavioral: Negative.     Objective:  BP 124/86 (BP Location: Left Arm, Patient Position: Sitting, Cuff Size: Large)   Pulse 79   Temp 97.9 F (36.6 C) (Oral)   Resp 16   Ht '5\' 7"'$  (1.702 m)   Wt 273 lb (123.8 kg)   SpO2 96%   BMI 42.76 kg/m   BP Readings from Last 3 Encounters:  02/14/21 124/86  01/25/21 124/65  01/22/21 140/90    Wt Readings from Last 3 Encounters:  02/14/21 273 lb (123.8 kg)  01/25/21 279 lb (126.6 kg)  01/22/21 279 lb (126.6 kg)    Physical  Exam Vitals reviewed.  Constitutional:      Appearance: Normal appearance.  HENT:     Nose: Nose normal.     Mouth/Throat:     Mouth: Mucous membranes are moist.  Eyes:     General: No scleral icterus.    Conjunctiva/sclera: Conjunctivae normal.  Cardiovascular:     Rate and Rhythm: Normal rate and regular rhythm.     Heart sounds: No murmur heard. Pulmonary:     Effort: Pulmonary effort is normal.     Breath sounds: No stridor. No wheezing, rhonchi or rales.  Abdominal:     General: Abdomen is protuberant. Bowel sounds are normal. There is no distension.     Palpations: Abdomen is soft. There is no fluid wave, hepatomegaly, splenomegaly or mass.     Tenderness: There is no abdominal tenderness.  Musculoskeletal:        General: Deformity (OA both knees) present. No swelling.     Cervical back: Neck supple.     Right lower leg: No edema.     Left lower leg: No edema.  Lymphadenopathy:     Cervical: No cervical adenopathy.  Skin:    General: Skin is warm and dry.  Neurological:     General: No focal deficit present.     Mental Status: He is alert.  Psychiatric:        Mood and Affect: Mood normal.    Lab Results  Component Value Date   WBC 13.0 (H) 02/14/2021   HGB 14.2 02/14/2021   HCT 42.7 02/14/2021   PLT 235.0 02/14/2021   GLUCOSE 89 01/02/2021   CHOL 125 09/04/2020   TRIG 79 09/04/2020   HDL 52 09/04/2020   LDLCALC 57 09/04/2020   ALT 22 01/02/2021   AST 16 01/02/2021   NA 141 01/02/2021   K 3.6 01/02/2021   CL 104 01/02/2021   CREATININE 0.84 01/02/2021   BUN 10 01/02/2021   CO2 28 01/02/2021   TSH 1.48 09/04/2020   PSA 0.27 02/14/2021   INR CANCELED 08/12/2013   HGBA1C 5.7 (H) 12/22/2019    CT ABDOMEN PELVIS W CONTRAST  Result Date: 01/02/2021 CLINICAL DATA:  Nausea vomiting diarrhea pain EXAM: CT ABDOMEN AND PELVIS WITH CONTRAST TECHNIQUE: Multidetector CT imaging of the abdomen and pelvis was performed using the standard protocol following bolus  administration of intravenous contrast. CONTRAST:  144m OMNIPAQUE IOHEXOL 300 MG/ML  SOLN COMPARISON:  CT 09/07/2017 FINDINGS: Lower chest: Lung bases demonstrate no acute consolidation or effusion. Normal cardiac size. Stable subpleural left lower lobe pulmonary nodule. Hepatobiliary: Subcentimeter hypodensity in the right hepatic lobe too small to further characterize. No calcified gallstone or biliary dilatation Pancreas: Unremarkable. No pancreatic ductal dilatation or surrounding inflammatory changes. Spleen: Normal in size without focal abnormality. Adrenals/Urinary Tract: Stable right adrenal gland 1 cm nodule. Left adrenal gland is normal. Cysts in the right kidney. 5 mm nonobstructing stone lower pole  left kidney. Additional subcentimeter hypodense renal lesions too small to further characterize. The bladder is normal Stomach/Bowel: Stomach is within normal limits. Appendix appears normal. No evidence of bowel wall thickening, distention, or inflammatory changes. Diverticular disease of left colon without acute inflammation. Vascular/Lymphatic: Mild aortic atherosclerosis without aneurysm. No enlarged abdominal or pelvic lymph nodes. Reproductive: Prostate is unremarkable. Other: Negative for free air or free fluid Musculoskeletal: No acute or significant osseous findings. IMPRESSION: 1. No CT evidence for acute intra-abdominal or pelvic abnormality. 2. Nonobstructing left kidney stone 3. Diverticular disease of the left colon without acute inflammatory change Electronically Signed   By: Donavan Foil M.D.   On: 01/02/2021 23:17    Assessment & Plan:   [name] was seen today for annual exam and osteoarthritis.  Diagnoses and all orders for this visit:  Essential hypertension- His blood pressure is well controlled. -     CBC with Differential/Platelet; Future -     CBC with Differential/Platelet  Vitamin D deficiency disease- His vitamin D level is normal. -     VITAMIN D 25 Hydroxy (Vit-D  Deficiency, Fractures); Future -     VITAMIN D 25 Hydroxy (Vit-D Deficiency, Fractures)  Encounter for general adult medical examination with abnormal findings- Exam completed, labs reviewed, vaccines reviewed: He refused a flu vaccine, I recommended that he get a shingles vaccine, cancer screenings addressed and up-to-date, patient education was given. -     PSA; Future -     PSA  Primary osteoarthritis of both knees- NSAIDs are contraindicated because he takes a DOAC. -     traMADol (ULTRAM) 50 MG tablet; Take 1 tablet (50 mg total) by mouth every 6 (six) hours as needed.  Need for shingles vaccine -     Zoster Vaccine Adjuvanted [facility]) injection; Inject 0.5 mLs into the muscle once for 1 dose.  I am having [name]. Erskin start on traMADol and Shingrix. I am also having him maintain his multivitamin with minerals, Cholecalciferol, Vitamin D (Ergocalciferol), sildenafil, Armodafinil, tadalafil, nebivolol, rivaroxaban, potassium chloride SA, Trelegy Ellipta, albuterol, rosuvastatin, pantoprazole, and nitroGLYCERIN.  Meds ordered this encounter  Medications   traMADol (ULTRAM) 50 MG tablet    Sig: Take 1 tablet (50 mg total) by mouth every 6 (six) hours as needed.    Dispense:  270 tablet    Refill:  0   Zoster Vaccine Adjuvanted [facility]) injection    Sig: Inject 0.5 mLs into the muscle once for 1 dose.    Dispense:  0.5 mL    Refill:  1      Follow-up: Return in about 6 months (around 08/14/2021).  Scarlette Calico, MD

## 2021-02-14 NOTE — Patient Instructions (Signed)
Health Maintenance, Male Adopting a healthy lifestyle and getting preventive care are important in promoting health and wellness. Ask your health care provider about: The right schedule for you to have regular tests and exams. Things you can do on your own to prevent diseases and keep yourself healthy. What should I know about diet, weight, and exercise? Eat a healthy diet  Eat a diet that includes plenty of vegetables, fruits, low-fat dairy products, and lean protein. Do not eat a lot of foods that are high in solid fats, added sugars, or sodium. Maintain a healthy weight Body mass index (BMI) is a measurement that can be used to identify possible weight problems. It estimates body fat based on height and weight. Your health care provider can help determine your BMI and help you achieve or maintain a healthy weight. Get regular exercise Get regular exercise. This is one of the most important things you can do for your health. Most adults should: Exercise for at least 150 minutes each week. The exercise should increase your heart rate and make you sweat (moderate-intensity exercise). Do strengthening exercises at least twice a week. This is in addition to the moderate-intensity exercise. Spend less time sitting. Even light physical activity can be beneficial. Watch cholesterol and blood lipids Have your blood tested for lipids and cholesterol at 55 years of age, then have this test every 5 years. You may need to have your cholesterol levels checked more often if: Your lipid or cholesterol levels are high. You are older than 55 years of age. You are at high risk for heart disease. What should I know about cancer screening? Many types of cancers can be detected early and may often be prevented. Depending on your health history and family history, you may need to have cancer screening at various ages. This may include screening for: Colorectal cancer. Prostate cancer. Skin cancer. Lung  cancer. What should I know about heart disease, diabetes, and high blood pressure? Blood pressure and heart disease High blood pressure causes heart disease and increases the risk of stroke. This is more likely to develop in people who have high blood pressure readings, are of African descent, or are overweight. Talk with your health care provider about your target blood pressure readings. Have your blood pressure checked: Every 3-5 years if you are 18-39 years of age. Every year if you are 40 years old or older. If you are between the ages of 65 and 75 and are a current or former smoker, ask your health care provider if you should have a one-time screening for abdominal aortic aneurysm (AAA). Diabetes Have regular diabetes screenings. This checks your fasting blood sugar level. Have the screening done: Once every three years after age 45 if you are at a normal weight and have a low risk for diabetes. More often and at a younger age if you are overweight or have a high risk for diabetes. What should I know about preventing infection? Hepatitis B If you have a higher risk for hepatitis B, you should be screened for this virus. Talk with your health care provider to find out if you are at risk for hepatitis B infection. Hepatitis C Blood testing is recommended for: Everyone born from 1945 through 1965. Anyone with known risk factors for hepatitis C. Sexually transmitted infections (STIs) You should be screened each year for STIs, including gonorrhea and chlamydia, if: You are sexually active and are younger than 55 years of age. You are older than 55 years   of age and your health care provider tells you that you are at risk for this type of infection. Your sexual activity has changed since you were last screened, and you are at increased risk for chlamydia or gonorrhea. Ask your health care provider if you are at risk. Ask your health care provider about whether you are at high risk for HIV.  Your health care provider may recommend a prescription medicine to help prevent HIV infection. If you choose to take medicine to prevent HIV, you should first get tested for HIV. You should then be tested every 3 months for as long as you are taking the medicine. Follow these instructions at home: Lifestyle Do not use any products that contain nicotine or tobacco, such as cigarettes, e-cigarettes, and chewing tobacco. If you need help quitting, ask your health care provider. Do not use street drugs. Do not share needles. Ask your health care provider for help if you need support or information about quitting drugs. Alcohol use Do not drink alcohol if your health care provider tells you not to drink. If you drink alcohol: Limit how much you have to 0-2 drinks a day. Be aware of how much alcohol is in your drink. In the [place] one drink equals one 12 oz bottle of beer (355 mL), one 5 oz glass of wine (148 mL), or one 1 oz glass of hard liquor (44 mL). General instructions Schedule regular health, dental, and eye exams. Stay current with your vaccines. Tell your health care provider if: You often feel depressed. You have ever been abused or do not feel safe at home. Summary Adopting a healthy lifestyle and getting preventive care are important in promoting health and wellness. Follow your health care provider's instructions about healthy diet, exercising, and getting tested or screened for diseases. Follow your health care provider's instructions on monitoring your cholesterol and blood pressure. This information is not intended to replace advice given to you by your health care provider. Make sure you discuss any questions you have with your health care provider. Document Revised: 07/27/2020 Document Reviewed: 05/12/2018 [employer]vier Patient Education  2022 [employer]  

## 2021-02-15 MED ORDER — SHINGRIX 50 MCG/0.5ML IM SUSR: 0.5000 mL | Freq: Once | INTRAMUSCULAR | 1 refills | Status: AC

## 2021-03-11 NOTE — Progress Notes (Addendum)
[facility] [place] Phone: [phone] Subjective:   [name], am serving as a scribe for Dr. Hulan Saas.  This visit occurred during the SARS-CoV-2 public health emergency.  Safety protocols were in place, including screening questions prior to the visit, additional usage of staff PPE, and extensive cleaning of exam room while observing appropriate contact time as indicated for disinfecting solutions.   I'm seeing this patient by the request  of:  [name], MD  CC: Shoulder and bilateral knee pain  [id number]  01/22/2021 Chronic problem with exacerbation.  Discussed with patient at great length.  Still wants to avoid any surgical intervention.  Patient does do a lot of overhead activity.  Patient will try to watch the lifting mechanics.  Increase activity slowly.  Follow-up again 6 to 8 weeks Chronic problem with worsening symptoms.  Patient has failed conservative therapy.  We will see if we can get approval for viscosupplementation.  Significant arthritic changes.  Continues to try to be active.  Patient is not a candidate until patient has a BMI of less than 40 for any type of knee replacement.  Follow-up with me again 6 weeks  Updated 03/12/2021 [name] is a 55 y.o. male coming in with complaint of bilateral knee and shoulder pain. Pain over superior aspect of shoulder. Feels like there is a knot on top of his shoulder.  Patient has known severe arthritic changes noted of the acromioclavicular joint.  Has responded to injection previously but is having worsening pain that is affecting daily activities, job performance as well as waking him up at night.  Known arthritic changes of the knees as well.  Patient is having worsening pain bilaterally.  Patient's insurance has declined viscosupplementation.  Patient does not have any other significant treatment options.      Past Medical History:   Diagnosis Date   Anxiety    Arthritis    knees - [name] med   Cluster headache    Hx - resolved per patient   Depression    DVT of lower extremity (deep venous thrombosis) ([place]) 6/26-28/2013   Xarelto - resolved - history   HTN (hypertension)    currently [name] meds   Irritability and anger    PMH of   Kidney stone    passed stone, [name] surgery required.   Mild intermittent asthma without complication 2/72/5366   Rotator cuff arthropathy 2007   right   Saddle pulmonary embolus ([place]) 6/26-28/2013   post orthopedic surgery - resolved   Sleep apnea 5/15   moderate OSA-has cpap but does not use it   Past Surgical History:  Procedure Laterality Date   arthroscopic knee surgery  10/09/2011   Dr Ninfa Linden   CARDIAC CATHETERIZATION  06/02/2005   LVdysfunction; normal coronaries   COLONOSCOPY     KNEE ARTHROSCOPY WITH MEDIAL MENISECTOMY Left 09/20/2014   medial menisectomy chondroplastytella medial plica excision  ;  Surgeon: Dorna Leitz, MD;  Location: [facility];  Service: Orthopedics;  Laterality: Left;   ROTATOR CUFF REPAIR  06/02/2005   right   UPPER GASTROINTESTINAL ENDOSCOPY  06/02/2005   normal   WISDOM TOOTH EXTRACTION     Social History   Socioeconomic History   Marital status: Married    Spouse name: Not on file   Number of children: 6   Years of education: 11   Highest education level: Not on file  Occupational History  Occupation: Glass blower/designer    Occupation: Programmer, systems  Tobacco Use   Smoking status: Some Days    Types: Cigars   Smokeless tobacco: Never   Tobacco comments:    every 3-4 months  Vaping Use   Vaping Use: Never used  Substance and Sexual Activity   Alcohol use: Yes    Alcohol/week: 0.0 standard drinks    Comment: drinks occasionally   Drug use: [name]   Sexual activity: Yes    Birth control/protection: Condom  Other Topics Concern   Not on file  Social History Narrative   Fun: Exercise   Social Determinants of Health    Financial Resource Strain: Not on file  Food Insecurity: Not on file  Transportation Needs: Not on file  Physical Activity: Not on file  Stress: Not on file  Social Connections: Not on file   [name] Active Allergies Family History  Problem Relation Age of Onset   Cancer Mother        Mesothelioma    Diabetes Brother    Hypertension Brother    Stroke Brother 50   Heart attack Brother 48   Diabetes Maternal Aunt    Diabetes Maternal Grandmother    Heart disease Maternal Grandmother    Hypertension Maternal Grandmother    Stroke Maternal Grandmother        in 71s   Deep vein thrombosis Maternal Grandmother    Stroke Maternal Grandfather        > 83   Migraines Brother    Heart attack Brother 69   Healthy Father    Colon cancer Neg Hx    Esophageal cancer Neg Hx    Rectal cancer Neg Hx    Stomach cancer Neg Hx    Liver cancer Neg Hx    Pancreatic cancer Neg Hx    Other Neg Hx        low testosterone     Current Outpatient Medications (Cardiovascular):    nebivolol (BYSTOLIC) 5 MG tablet, Take 1 tablet (5 mg total) by mouth daily.   nitroGLYCERIN (NITROSTAT) 0.3 MG SL tablet, Place 0.3 mg under the tongue 2 (two) times daily as needed.   rosuvastatin (CRESTOR) 10 MG tablet, Take 1 tablet (10 mg total) by mouth daily.   tadalafil (CIALIS) 20 MG tablet, Take 1 tablet (20 mg total) by mouth daily as needed for erectile dysfunction.  Current Outpatient Medications (Respiratory):    albuterol (VENTOLIN HFA) 108 (90 Base) MCG/ACT inhaler, Inhale 1-2 puffs into the lungs every 6 (six) hours as needed for wheezing or shortness of breath.   TRELEGY ELLIPTA 100-62.5-25 MCG/INH AEPB, Inhale 1 puff into the lungs daily.  Current Outpatient Medications (Analgesics):    traMADol (ULTRAM) 50 MG tablet, Take 1 tablet (50 mg total) by mouth every 6 (six) hours as needed.  Current Outpatient Medications (Hematological):    rivaroxaban (XARELTO) 10 MG TABS tablet, Take 1 tablet (10 mg  total) by mouth daily.  Current Outpatient Medications (Other):    Armodafinil 250 MG tablet, Take 1 tablet (250 mg total) by mouth daily.   Cholecalciferol 50 MCG (2000 UT) TABS, Take 1 tablet (2,000 Units total) by mouth daily.   Multiple Vitamin (MULTIVITAMIN WITH MINERALS) TABS tablet, Take 1 tablet by mouth daily.   pantoprazole (PROTONIX) 40 MG tablet, Take 1 tablet (40 mg total) by mouth daily.   potassium chloride SA (KLOR-CON M20) 20 MEQ tablet, Take 1 tablet (20 mEq total) by mouth 2 (two) times daily.  Vitamin D, Ergocalciferol, (DRISDOL) 1.25 MG (50000 UNIT) CAPS capsule, TAKE 1 CAPSULE (50,000 UNITS TOTAL) BY MOUTH EVERY 7 (SEVEN) DAYS.   Reviewed prior external information including notes and imaging from  primary care provider As well as notes that were available from care everywhere and other healthcare systems.  Past medical history, social, surgical and family history all reviewed in electronic medical record.  [name] pertanent information unless stated regarding to the chief complaint.   Review of Systems:  [name] headache, visual changes, nausea, vomiting, diarrhea, constipation, dizziness, abdominal pain, skin rash, fevers, chills, night sweats, weight loss, swollen lymph nodes, body aches, joint swelling, chest pain, shortness of breath, mood changes. POSITIVE muscle aches  Objective  Blood pressure 110/72, pulse 67, height 5\' 7"  (1.702 m), weight 277 lb (125.6 kg), SpO2 97 %.   General: [name] apparent distress alert and oriented x3 mood and affect normal, dressed appropriately.  HEENT: Pupils equal, extraocular movements intact  Respiratory: Patient's speak in full sentences and does not appear short of breath  Cardiovascular: [name] lower extremity edema, non tender, [name] erythema  Gait normal with good balance and coordination.  MSK: Right shoulder exam shows the patient does have severe positive crossover.  Limited range of motion.  Severe tenderness and noted swelling noted  over the acromioclavicular joint  Bilateral knees do have arthritic changes.  Mild instability noted with valgus and varus force.  Crepitus noted with range of motion.  Abnormal thigh to calf ratio noted bilaterally.  Procedure: Real-time Ultrasound Guided Injection of right acromioclavicular joint Device: GE Logiq Q7 Ultrasound guided injection is preferred based studies that show increased duration, increased effect, greater accuracy, decreased procedural pain, increased response rate, and decreased cost with ultrasound guided versus blind injection.  Verbal informed consent obtained.  Time-out conducted.  Noted [name] overlying erythema, induration, or other signs of local infection.  Skin prepped in a sterile fashion.  Local anesthesia: Topical Ethyl chloride.  With sterile technique and under real time ultrasound guidance: With a 25-gauge half inch needle injected with 0.5 cc of 0.5% Marcaine and 0.5 cc of Kenalog 40 mg/mL Completed without difficulty  Pain immediately improved suggesting accurate placement of the medication.  Advised to call if fevers/chills, erythema, induration, drainage, or persistent bleeding.  Impression: Technically successful ultrasound guided injection.  After informed written and verbal consent, patient was seated on exam table. Right knee was prepped with alcohol swab and utilizing anterolateral approach, patient's right knee space was injected with 4:1  marcaine 0.5%: Kenalog 40mg /dL. Patient tolerated the procedure well without immediate complications.  After informed written and verbal consent, patient was seated on exam table. Left knee was prepped with alcohol swab and utilizing anterolateral approach, patient's left knee space was injected with 4:1  marcaine 0.5%: Kenalog 40mg /dL. Patient tolerated the procedure well without immediate complications.    Impression and Recommendations:     The above documentation has been reviewed and is accurate and  complete Lyndal Pulley, DO

## 2021-03-12 ENCOUNTER — Ambulatory Visit (INDEPENDENT_AMBULATORY_CARE_PROVIDER_SITE_OTHER): Payer: BC Managed Care – PPO | Admitting: Family Medicine

## 2021-03-12 ENCOUNTER — Ambulatory Visit: Payer: Self-pay

## 2021-03-12 ENCOUNTER — Other Ambulatory Visit: Payer: Self-pay

## 2021-03-12 ENCOUNTER — Other Ambulatory Visit: Payer: Self-pay | Admitting: Internal Medicine

## 2021-03-12 VITALS — BP 110/72 | HR 67 | Ht 67.0 in | Wt 277.0 lb

## 2021-03-12 DIAGNOSIS — M19011 Primary osteoarthritis, right shoulder: Secondary | ICD-10-CM

## 2021-03-12 DIAGNOSIS — G8929 Other chronic pain: Secondary | ICD-10-CM

## 2021-03-12 DIAGNOSIS — M170 Bilateral primary osteoarthritis of knee: Secondary | ICD-10-CM | POA: Diagnosis not present

## 2021-03-12 DIAGNOSIS — N529 Male erectile dysfunction, unspecified: Secondary | ICD-10-CM

## 2021-03-12 DIAGNOSIS — M25511 Pain in right shoulder: Secondary | ICD-10-CM | POA: Diagnosis not present

## 2021-03-12 MED ORDER — MONOVISC 88 MG/4ML IX SOSY: 2.0000 | PREFILLED_SYRINGE | Freq: Once | INTRA_ARTICULAR | 0 refills | Status: AC

## 2021-03-12 NOTE — Patient Instructions (Addendum)
[facility] called into your pharmacy [facility] Butts will call you See you again in 8 weeks if you get gel let us know

## 2021-03-13 ENCOUNTER — Encounter: Payer: Self-pay | Admitting: Family Medicine

## 2021-03-13 ENCOUNTER — Other Ambulatory Visit: Payer: Self-pay | Admitting: Internal Medicine

## 2021-03-13 DIAGNOSIS — N529 Male erectile dysfunction, unspecified: Secondary | ICD-10-CM

## 2021-03-13 MED ORDER — TADALAFIL 20 MG PO TABS: 20.0000 mg | ORAL_TABLET | Freq: Every day | ORAL | 5 refills | Status: DC | PRN

## 2021-03-13 NOTE — Assessment & Plan Note (Addendum)
Chronic problem with worsening symptoms.  Patient is unable to get viscosupplementation secondary to insurance and financial constraints.  Discussed with patient about the possibility of needing surgical intervention at some point.  Patient wants to continue to avoid.  Possible.  Repeat steroid injection today to help with some relief.  Patient does have instability in the knees bilaterally and abnormal thigh to calf ratio.  No instability basis would be beneficial..

## 2021-03-13 NOTE — Assessment & Plan Note (Signed)
Repeat injection given today, tolerated the procedure well.  Patient does have severe arthritic changes.  Patient still wants to avoid any type of surgical intervention if possible.  Discussed which activities to do which wants to avoid.  Increase activity slowly.  Follow-up with me again in 6 to 8 weeks.

## 2021-03-14 ENCOUNTER — Other Ambulatory Visit: Payer: Self-pay | Admitting: Internal Medicine

## 2021-03-29 DIAGNOSIS — M170 Bilateral primary osteoarthritis of knee: Secondary | ICD-10-CM | POA: Diagnosis not present

## 2021-03-30 ENCOUNTER — Other Ambulatory Visit: Payer: Self-pay | Admitting: Internal Medicine

## 2021-03-30 DIAGNOSIS — I10 Essential (primary) hypertension: Secondary | ICD-10-CM

## 2021-04-01 DIAGNOSIS — M170 Bilateral primary osteoarthritis of knee: Secondary | ICD-10-CM | POA: Diagnosis not present

## 2021-04-23 DIAGNOSIS — G4733 Obstructive sleep apnea (adult) (pediatric): Secondary | ICD-10-CM | POA: Diagnosis not present

## 2021-04-24 ENCOUNTER — Telehealth: Payer: Self-pay | Admitting: Family Medicine

## 2021-04-24 NOTE — Telephone Encounter (Signed)
Patient called following up on the gel injection approval?  Lesly Rubenstein was not sure. With his insurance, they will not approve it. Dr Tamala Julian put in the last office visit note "[facility] called into your pharmacy"  Please advise. Patient can be reached at: [phone]

## 2021-04-29 ENCOUNTER — Telehealth: Payer: Self-pay | Admitting: *Deleted

## 2021-04-29 NOTE — Telephone Encounter (Signed)
Left message to call back. Injection is not approved but can do steroid at next visit.

## 2021-04-29 NOTE — Telephone Encounter (Signed)
Pt called back asking if we could send the gel injections into the pharmacy to see if his insurance would approve it that way.

## 2021-04-30 NOTE — Telephone Encounter (Signed)
Error

## 2021-04-30 NOTE — Telephone Encounter (Signed)
Attempted to call patient but phone is not working. Gel was sent into pharmacy but is not covered through that route either. Can still come in for steroid injection at next visit.

## 2021-05-01 NOTE — Telephone Encounter (Signed)
Spoke with pt, informed GEL denied via pharmacy as well. Added knee injections to appt for 12/7 per note from Val.

## 2021-05-07 NOTE — Progress Notes (Signed)
[facility] [place] Phone: [phone] Subjective:   [name], am serving as a scribe for Dr. Hulan Saas.  This visit occurred during the SARS-CoV-2 public health emergency.  Safety protocols were in place, including screening questions prior to the visit, additional usage of staff PPE, and extensive cleaning of exam room while observing appropriate contact time as indicated for disinfecting solutions.    I'm seeing this patient by the request  of:  [name], MD  CC: Bilateral knee pain, back pain  LKG:[id number]  03/12/2021 Repeat injection given today, tolerated the procedure well.  Patient does have severe arthritic changes.  Patient still wants to avoid any type of surgical intervention if possible.  Discussed which activities to do which wants to avoid.  Increase activity slowly.  Follow-up with me again in 6 to 8 weeks.  Updated 05/08/2021 MAANAV KASSABIAN is a 55 y.o. male coming in with complaint of shoulder and back pain. Patient notes back pain has been worse recently. Pain will last for one week and then it will stop. Facet injection in February 2022. Pain began again one month ago. No radicular symptoms but is concentrated in lumbar spine.   Pain continues in R shoulder over superior aspect. Had increase in pain 2 weeks ago but has improved since then but is still bothering him.   B knee pain is constant and getting worse recently. Pain is constant. Taking IBU, Tramadol and Icy Hot.     Past Medical History:  Diagnosis Date   Anxiety    Arthritis    knees - no med   Cluster headache    Hx - resolved per patient   Depression    DVT of lower extremity (deep venous thrombosis) ([place]) 6/26-28/2013   Xarelto - resolved - history   HTN (hypertension)    currently no meds   Irritability and anger    PMH of   Kidney stone    passed stone, no surgery required.   Mild intermittent asthma without  complication 6/44/0347   Rotator cuff arthropathy 2007   right   Saddle pulmonary embolus ([place]) 6/26-28/2013   post orthopedic surgery - resolved   Sleep apnea 5/15   moderate OSA-has cpap but does not use it   Past Surgical History:  Procedure Laterality Date   arthroscopic knee surgery  10/09/2011   Dr Ninfa Linden   CARDIAC CATHETERIZATION  06/02/2005   LVdysfunction; normal coronaries   COLONOSCOPY     KNEE ARTHROSCOPY WITH MEDIAL MENISECTOMY Left 09/20/2014   medial menisectomy chondroplastytella medial plica excision  ;  Surgeon: Dorna Leitz, MD;  Location: [facility];  Service: Orthopedics;  Laterality: Left;   ROTATOR CUFF REPAIR  06/02/2005   right   UPPER GASTROINTESTINAL ENDOSCOPY  06/02/2005   normal   WISDOM TOOTH EXTRACTION     Social History   Socioeconomic History   Marital status: Married    Spouse name: Not on file   Number of children: 6   Years of education: 11   Highest education level: Not on file  Occupational History   Occupation: Glass blower/designer    Occupation: Programmer, systems  Tobacco Use   Smoking status: Some Days    Types: Cigars   Smokeless tobacco: Never   Tobacco comments:    every 3-4 months  Vaping Use   Vaping Use: Never used  Substance and Sexual Activity   Alcohol use: Yes  Alcohol/week: 0.0 standard drinks    Comment: drinks occasionally   Drug use: No   Sexual activity: Yes    Birth control/protection: Condom  Other Topics Concern   Not on file  Social History Narrative   Fun: Exercise   Social Determinants of Health   Financial Resource Strain: Not on file  Food Insecurity: Not on file  Transportation Needs: Not on file  Physical Activity: Not on file  Stress: Not on file  Social Connections: Not on file   No Active Allergies Family History  Problem Relation Age of Onset   Cancer Mother        Mesothelioma    Diabetes Brother    Hypertension Brother    Stroke Brother 47   Heart attack Brother  15   Diabetes Maternal Aunt    Diabetes Maternal Grandmother    Heart disease Maternal Grandmother    Hypertension Maternal Grandmother    Stroke Maternal Grandmother        in 14s   Deep vein thrombosis Maternal Grandmother    Stroke Maternal Grandfather        > 36   Migraines Brother    Heart attack Brother 71   Healthy Father    Colon cancer Neg Hx    Esophageal cancer Neg Hx    Rectal cancer Neg Hx    Stomach cancer Neg Hx    Liver cancer Neg Hx    Pancreatic cancer Neg Hx    Other Neg Hx        low testosterone     Current Outpatient Medications (Cardiovascular):    nebivolol (BYSTOLIC) 5 MG tablet, Take 1 tablet (5 mg total) by mouth daily.   nitroGLYCERIN (NITROSTAT) 0.3 MG SL tablet, Place 0.3 mg under the tongue 2 (two) times daily as needed.   rosuvastatin (CRESTOR) 10 MG tablet, Take 1 tablet (10 mg total) by mouth daily.   sildenafil (REVATIO) 20 MG tablet, Take 3 tablets (60 mg total) by mouth daily as needed.  Current Outpatient Medications (Respiratory):    albuterol (VENTOLIN HFA) 108 (90 Base) MCG/ACT inhaler, Inhale 1-2 puffs into the lungs every 6 (six) hours as needed for wheezing or shortness of breath.   TRELEGY ELLIPTA 100-62.5-25 MCG/INH AEPB, Inhale 1 puff into the lungs daily.  Current Outpatient Medications (Analgesics):    traMADol (ULTRAM) 50 MG tablet, Take 1 tablet (50 mg total) by mouth every 6 (six) hours as needed.  Current Outpatient Medications (Hematological):    rivaroxaban (XARELTO) 10 MG TABS tablet, Take 1 tablet (10 mg total) by mouth daily.  Current Outpatient Medications (Other):    Armodafinil 250 MG tablet, Take 1 tablet (250 mg total) by mouth daily.   Cholecalciferol 50 MCG (2000 UT) TABS, Take 1 tablet (2,000 Units total) by mouth daily.   Multiple Vitamin (MULTIVITAMIN WITH MINERALS) TABS tablet, Take 1 tablet by mouth daily.   pantoprazole (PROTONIX) 40 MG tablet, Take 1 tablet (40 mg total) by mouth daily.   potassium  chloride SA (KLOR-CON M20) 20 MEQ tablet, Take 1 tablet (20 mEq total) by mouth 2 (two) times daily.   Vitamin D, Ergocalciferol, (DRISDOL) 1.25 MG (50000 UNIT) CAPS capsule, TAKE 1 CAPSULE (50,000 UNITS TOTAL) BY MOUTH EVERY 7 (SEVEN) DAYS.   Reviewed prior external information including notes and imaging from  primary care provider As well as notes that were available from care everywhere and other healthcare systems.  Past medical history, social, surgical and family history all reviewed  in electronic medical record.  No pertanent information unless stated regarding to the chief complaint.   Review of Systems:  No headache, visual changes, nausea, vomiting, diarrhea, constipation, dizziness, abdominal pain, skin rash, fevers, chills, night sweats, weight loss, swollen lymph nodes,  chest pain, shortness of breath, mood changes. POSITIVE muscle aches, body aches, joint swelling  Objective  Blood pressure (!) 138/94, pulse 79, height 5\' 7"  (1.702 m), weight 276 lb (125.2 kg), SpO2 96 %.   General: No apparent distress alert and oriented x3 mood and affect normal, dressed appropriately.  HEENT: Pupils equal, extraocular movements intact  Respiratory: Patient's speak in full sentences and does not appear short of breath  Cardiovascular: No lower extremity edema, non tender, no erythema  Gait antalgic Patient does have significant swelling of the knees bilaterally.  Patient does have limited range of motion.  Instability noted with valgus and varus force.  More tenderness to palpation on the left knee than the right.  After informed written and verbal consent, patient was seated on exam table. Right knee was prepped with alcohol swab and utilizing anterolateral approach, patient's right knee space was injected with 4:1  marcaine 0.5%: Kenalog 40mg /dL. Patient tolerated the procedure well without immediate complications.  After informed written and verbal consent, patient was seated on exam  table. Left knee was prepped with alcohol swab and utilizing anterolateral approach, patient's left knee space was injected with 4:1  marcaine 0.5%: Kenalog 40mg /dL. Patient tolerated the procedure well without immediate complications.   Impression and Recommendations:     The above documentation has been reviewed and is accurate and complete Lyndal Pulley, DO

## 2021-05-08 ENCOUNTER — Encounter: Payer: Self-pay | Admitting: Family Medicine

## 2021-05-08 ENCOUNTER — Ambulatory Visit (INDEPENDENT_AMBULATORY_CARE_PROVIDER_SITE_OTHER): Payer: BC Managed Care – PPO | Admitting: Family Medicine

## 2021-05-08 ENCOUNTER — Other Ambulatory Visit: Payer: Self-pay

## 2021-05-08 VITALS — BP 138/94 | HR 79 | Ht 67.0 in | Wt 276.0 lb

## 2021-05-08 DIAGNOSIS — M545 Low back pain, unspecified: Secondary | ICD-10-CM | POA: Diagnosis not present

## 2021-05-08 DIAGNOSIS — G8929 Other chronic pain: Secondary | ICD-10-CM | POA: Diagnosis not present

## 2021-05-08 DIAGNOSIS — M47816 Spondylosis without myelopathy or radiculopathy, lumbar region: Secondary | ICD-10-CM | POA: Diagnosis not present

## 2021-05-08 DIAGNOSIS — M170 Bilateral primary osteoarthritis of knee: Secondary | ICD-10-CM | POA: Diagnosis not present

## 2021-05-08 NOTE — Patient Instructions (Signed)
Injected both knees today Dr. Pryor Montes office will call you Order facet injections See me again in 8 weeks in case shoulder gives you trouble

## 2021-05-08 NOTE — Assessment & Plan Note (Signed)
Patient has known facet arthropathy.  Patient did respond initially into a medial branch block and do feel that a second 1 could be beneficial.  Patient does respond to medial branch block again and then patient could be set up for the possibility of radiofrequency ablation.  We have had difficulty otherwise getting different injections approved for this individual.  Patient wants to avoid surgical intervention and hoping that when patient gets his knee taking care of the back pain will improve as well.

## 2021-05-08 NOTE — Assessment & Plan Note (Signed)
Bilateral injections given again.  Chronic problem with exacerbation.  At this point I do not think that this is going to be working with him more for this individual.  I do feel that now patient does need surgical intervention.  Discussed with patient that we do need to attempt to get BMI under 40.  Will refer to orthopedic surgery to discuss further.  Follow-up with me again as needed for this problem

## 2021-05-12 ENCOUNTER — Other Ambulatory Visit: Payer: Self-pay | Admitting: Internal Medicine

## 2021-05-12 DIAGNOSIS — I10 Essential (primary) hypertension: Secondary | ICD-10-CM

## 2021-05-12 DIAGNOSIS — T502X5A Adverse effect of carbonic-anhydrase inhibitors, benzothiadiazides and other diuretics, initial encounter: Secondary | ICD-10-CM

## 2021-05-12 DIAGNOSIS — E876 Hypokalemia: Secondary | ICD-10-CM

## 2021-05-13 NOTE — Progress Notes (Deleted)
[id number] chest pain.  Cardiac catheterization 2007 showed normal coronary arteries with distal LAD intramyocardial bridge without obstruction.  Nuclear study June 2020 showed ejection fraction 51% with normal perfusion.  Echocardiogram September 2020 showed normal LV function, trace mitral regurgitation, mild tricuspid regurgitation.  CTA April 2022 showed no pulmonary embolus.  Since last seen   Current Outpatient Medications  Medication Sig Dispense Refill   albuterol (VENTOLIN HFA) 108 (90 Base) MCG/ACT inhaler Inhale 1-2 puffs into the lungs every 6 (six) hours as needed for wheezing or shortness of breath. 25.5 g 3   Armodafinil 250 MG tablet Take 1 tablet (250 mg total) by mouth daily. 90 tablet 1   Cholecalciferol 50 MCG (2000 UT) TABS Take 1 tablet (2,000 Units total) by mouth daily. 90 tablet 1   KLOR-CON M20 20 MEQ tablet TAKE 1 TABLET BY MOUTH TWICE A DAY 180 tablet 1   Multiple Vitamin (MULTIVITAMIN WITH MINERALS) TABS tablet Take 1 tablet by mouth daily.     nebivolol (BYSTOLIC) 5 MG tablet TAKE 1 TABLET (5 MG TOTAL) BY MOUTH DAILY. 90 tablet 1   nitroGLYCERIN (NITROSTAT) 0.3 MG SL tablet Place 0.3 mg under the tongue 2 (two) times daily as needed.     pantoprazole (PROTONIX) 40 MG tablet Take 1 tablet (40 mg total) by mouth daily. 30 tablet 5   rivaroxaban (XARELTO) 10 MG TABS tablet Take 1 tablet (10 mg total) by mouth daily. 90 tablet 1   rosuvastatin (CRESTOR) 10 MG tablet Take 1 tablet (10 mg total) by mouth daily. 90 tablet 1   sildenafil (REVATIO) 20 MG tablet Take 3 tablets (60 mg total) by mouth daily as needed. 60 tablet 5   traMADol (ULTRAM) 50 MG tablet Take 1 tablet (50 mg total) by mouth every 6 (six) hours as needed. 270 tablet 0   TRELEGY ELLIPTA 100-62.5-25 MCG/INH AEPB Inhale 1 puff into the lungs daily. 3 each 1   Vitamin D, Ergocalciferol, (DRISDOL) 1.25 MG (50000 UNIT) CAPS capsule TAKE 1 CAPSULE (50,000 UNITS TOTAL) BY MOUTH EVERY 7 (SEVEN) DAYS. 12  capsule 0   No current facility-administered medications for this visit.     Past Medical History:  Diagnosis Date   Anxiety    Arthritis    knees - no med   Cluster headache    Hx - resolved per patient   Depression    DVT of lower extremity (deep venous thrombosis) ([place]) 6/26-28/2013   Xarelto - resolved - history   HTN (hypertension)    currently no meds   Irritability and anger    PMH of   Kidney stone    passed stone, no surgery required.   Mild intermittent asthma without complication 10/05/3974   Rotator cuff arthropathy 2007   right   Saddle pulmonary embolus ([place]) 6/26-28/2013   post orthopedic surgery - resolved   Sleep apnea 5/15   moderate OSA-has cpap but does not use it    Past Surgical History:  Procedure Laterality Date   arthroscopic knee surgery  10/09/2011   Dr Ninfa Linden   CARDIAC CATHETERIZATION  06/02/2005   LVdysfunction; normal coronaries   COLONOSCOPY     KNEE ARTHROSCOPY WITH MEDIAL MENISECTOMY Left 09/20/2014   medial menisectomy chondroplastytella medial plica excision  ;  Surgeon: Dorna Leitz, MD;  Location: [facility];  Service: Orthopedics;  Laterality: Left;   ROTATOR CUFF REPAIR  06/02/2005   right   UPPER GASTROINTESTINAL ENDOSCOPY  06/02/2005  normal   WISDOM TOOTH EXTRACTION      Social History   Socioeconomic History   Marital status: Married    Spouse name: Not on file   Number of children: 6   Years of education: 11   Highest education level: Not on file  Occupational History   Occupation: Glass blower/designer    Occupation: Programmer, systems  Tobacco Use   Smoking status: Some Days    Types: Cigars   Smokeless tobacco: Never   Tobacco comments:    every 3-4 months  Vaping Use   Vaping Use: Never used  Substance and Sexual Activity   Alcohol use: Yes    Alcohol/week: 0.0 standard drinks    Comment: drinks occasionally   Drug use: No   Sexual activity: Yes    Birth control/protection: Condom  Other  Topics Concern   Not on file  Social History Narrative   Fun: Exercise   Social Determinants of Health   Financial Resource Strain: Not on file  Food Insecurity: Not on file  Transportation Needs: Not on file  Physical Activity: Not on file  Stress: Not on file  Social Connections: Not on file  Intimate Partner Violence: Not on file    Family History  Problem Relation Age of Onset   Cancer Mother        Mesothelioma    Diabetes Brother    Hypertension Brother    Stroke Brother 50   Heart attack Brother 56   Diabetes Maternal Aunt    Diabetes Maternal Grandmother    Heart disease Maternal Grandmother    Hypertension Maternal Grandmother    Stroke Maternal Grandmother        in 52s   Deep vein thrombosis Maternal Grandmother    Stroke Maternal Grandfather        > 72   Migraines Brother    Heart attack Brother 68   Healthy Father    Colon cancer Neg Hx    Esophageal cancer Neg Hx    Rectal cancer Neg Hx    Stomach cancer Neg Hx    Liver cancer Neg Hx    Pancreatic cancer Neg Hx    Other Neg Hx        low testosterone    ROS: no fevers or chills, productive cough, hemoptysis, dysphasia, odynophagia, melena, hematochezia, dysuria, hematuria, rash, seizure activity, orthopnea, PND, pedal edema, claudication. Remaining systems are negative.  Physical Exam: Well-developed well-nourished in no acute distress.  Skin is warm and dry.  HEENT is normal.  Neck is supple.  Chest is clear to auscultation with normal expansion.  Cardiovascular exam is regular rate and rhythm.  Abdominal exam nontender or distended. No masses palpated. Extremities show no edema. neuro grossly intact  ECG- personally reviewed  A/P  1 chest pain-patient has had longstanding difficulties with chest pain.  Given persistent nature we will plan cardiac CTA to rule out obstructive coronary disease.  2 hypertension-patient's blood pressure is controlled.  Continue present medical regimen.  3  hyperlipidemia-continue statin.  Kirk Ruths, MD

## 2021-05-17 ENCOUNTER — Ambulatory Visit: Payer: BC Managed Care – PPO | Admitting: Cardiology

## 2021-06-10 ENCOUNTER — Other Ambulatory Visit: Payer: Self-pay

## 2021-06-10 ENCOUNTER — Ambulatory Visit: Payer: Self-pay

## 2021-06-10 ENCOUNTER — Ambulatory Visit (INDEPENDENT_AMBULATORY_CARE_PROVIDER_SITE_OTHER): Payer: No Typology Code available for payment source | Admitting: Orthopaedic Surgery

## 2021-06-10 ENCOUNTER — Other Ambulatory Visit: Payer: Self-pay | Admitting: Family Medicine

## 2021-06-10 VITALS — Ht 67.0 in | Wt 276.0 lb

## 2021-06-10 DIAGNOSIS — M25562 Pain in left knee: Secondary | ICD-10-CM

## 2021-06-10 DIAGNOSIS — M1712 Unilateral primary osteoarthritis, left knee: Secondary | ICD-10-CM | POA: Diagnosis not present

## 2021-06-10 DIAGNOSIS — M47816 Spondylosis without myelopathy or radiculopathy, lumbar region: Secondary | ICD-10-CM

## 2021-06-10 DIAGNOSIS — M1711 Unilateral primary osteoarthritis, right knee: Secondary | ICD-10-CM

## 2021-06-10 DIAGNOSIS — M25561 Pain in right knee: Secondary | ICD-10-CM | POA: Diagnosis not present

## 2021-06-10 DIAGNOSIS — G8929 Other chronic pain: Secondary | ICD-10-CM

## 2021-06-10 NOTE — Progress Notes (Signed)
Office Visit Note   Patient: [name]           Date of Birth: 1966-02-01           MRN: [id number] Visit Date: 06/10/2021              Requested by: Lyndal Pulley, DO [place],   [id number] PCP: [name], MD   Assessment & Plan: Visit Diagnoses:  1. Chronic pain of left knee   2. Chronic pain of right knee   3. Unilateral primary osteoarthritis, right knee   4. Unilateral primary osteoarthritis, left knee   5. Severe obesity (BMI >= 40) (HCC)     Plan: I spoke with him with in length in detail about knee replacement surgery.  His knees are not large but unfortunately given his height and weight his BMI calculates to 43.23 and thus I cannot schedule surgery yet for him.  He is going to work on weight loss.  I would like to refer him to the bariatric clinic with Dr. Leafy Ro for weight loss.  He agrees with this treatment plan.  I would like to see him back in 3 months with a repeat weight and BMI calculation.  All questions and concerns were answered and addressed.  Follow-Up Instructions: Return in about 3 months (around 09/08/2021).   Orders:  Orders Placed This Encounter  Procedures   XR Knee 1-2 Views Right   XR Knee 1-2 Views Left   No orders of the defined types were placed in this encounter.     Procedures: No procedures performed   Clinical Data: No additional findings.   Subjective: Chief Complaint  Patient presents with   Left Knee - Pain   Right Knee - Pain  The patient is a very pleasant 56 year old gentleman sent from Dr. Hulan Saas to evaluate and treat bilateral knee arthritis with left worse than right.  He has had numerous injections on both knees over time.  He had 2 arthroscopic interventions with his left knee with time.  He is a Glass blower/designer.  His bilateral knee pain is daily and he reports decreased motion and strength in both knees.  Most of his pain is in the patellofemoral joint and the medial  joint line but there is some lateral tenderness with both knees as well.  This is been off for many years now.  He did have a previous history of blood clots and was on blood thinning medication and he is off this now.  He does have a BMI today in the office of 43.23.  His insurance is part of the [place] which does not allow joint replacement surgery to be scheduled until BMI is below 40.  He denies any diabetes.  His bilateral knee pain is daily and it is detrimentally affecting his mobility, his quality of life and his actives daily living.  He has been dealing with knee pain that is worsening over the last several years.  HPI  Review of Systems He denies any fever, chills, nausea, vomiting  Objective: Vital Signs: Ht 5\' 7"  (1.702 m)    Wt 276 lb (125.2 kg)    BMI 43.23 kg/m   Physical Exam He is alert and orient x3 and in no acute distress Ortho Exam Examination of both knees show significant medial joint line tenderness with varus malalignment that is correctable with both knees.  There is significant patellofemoral pain and patellofemoral degenerative changes.  Both knees are ligamentously stable. Specialty Comments:  No specialty comments available.  Imaging: XR Knee 1-2 Views Left  Result Date: 06/10/2021 2 views the left knee show tricompartment arthritis with varus malalignment, bone-on-bone wear with medial joint space narrowing at the medial joint space and patellofemoral arthritic changes.  XR Knee 1-2 Views Right  Result Date: 06/10/2021 2 views of the right knee show tricompartment arthritis with varus malalignment and bone-on-bone wear on the medial aspect in the patellofemoral joint.  There is osteophytes in all 3 compartments.    PMFS History: Patient Active Problem List   Diagnosis Date Noted   Encounter for general adult medical examination with abnormal findings 02/14/2021   Chronic anticoagulation 01/22/2021   Mild intermittent asthma without  complication 94/17/4081   Polyp of colon 11/13/2020   Hypogonadism male 02/10/2020   Erectile dysfunction due to arterial insufficiency 02/08/2020   Intrinsic eczema 02/08/2020   H. pylori infection 01/25/2020   Low back pain 12/23/2019   Excessive somnolence disorder 05/09/2019   AC (acromioclavicular) arthritis 05/03/2019   Subacromial bursitis of right shoulder joint 05/03/2019   Ejection fraction < 50% 02/03/2019   Vitamin D deficiency disease 11/03/2018   PUD (peptic ulcer disease) 01/20/2018   Renal stone 08/26/2017   Morbid obesity ([place]) 08/25/2017   GERD with esophagitis 08/24/2017   Degenerative arthritis of knee, bilateral 06/08/2017   Venous insufficiency of both lower extremities 10/05/2015   Heterozygous factor V Leiden mutation ([place]) 08/24/2013   OSA (obstructive sleep apnea) 08/13/2013   Incomplete right bundle branch block 06/18/2011   Mixed hyperlipidemia 07/06/2008   Essential hypertension 07/06/2008   ERECTILE DYSFUNCTION, ORGANIC 01/28/2007   Past Medical History:  Diagnosis Date   Anxiety    Arthritis    knees - no med   Cluster headache    Hx - resolved per patient   Depression    DVT of lower extremity (deep venous thrombosis) ([place]) 6/26-28/2013   Xarelto - resolved - history   HTN (hypertension)    currently no meds   Irritability and anger    PMH of   Kidney stone    passed stone, no surgery required.   Mild intermittent asthma without complication 4/48/1856   Rotator cuff arthropathy 2007   right   Saddle pulmonary embolus ([place]) 6/26-28/2013   post orthopedic surgery - resolved   Sleep apnea 5/15   moderate OSA-has cpap but does not use it    Family History  Problem Relation Age of Onset   Cancer Mother        Mesothelioma    Diabetes Brother    Hypertension Brother    Stroke Brother 13   Heart attack Brother 72   Diabetes Maternal Aunt    Diabetes Maternal Grandmother    Heart disease Maternal Grandmother    Hypertension Maternal  Grandmother    Stroke Maternal Grandmother        in 70s   Deep vein thrombosis Maternal Grandmother    Stroke Maternal Grandfather        > 65   Migraines Brother    Heart attack Brother 45   Healthy Father    Colon cancer Neg Hx    Esophageal cancer Neg Hx    Rectal cancer Neg Hx    Stomach cancer Neg Hx    Liver cancer Neg Hx    Pancreatic cancer Neg Hx    Other Neg Hx        low testosterone  Past Surgical History:  Procedure Laterality Date   arthroscopic knee surgery  10/09/2011   Dr Ninfa Linden   CARDIAC CATHETERIZATION  06/02/2005   LVdysfunction; normal coronaries   COLONOSCOPY     KNEE ARTHROSCOPY WITH MEDIAL MENISECTOMY Left 09/20/2014   medial menisectomy chondroplastytella medial plica excision  ;  Surgeon: Dorna Leitz, MD;  Location: [facility];  Service: Orthopedics;  Laterality: Left;   ROTATOR CUFF REPAIR  06/02/2005   right   UPPER GASTROINTESTINAL ENDOSCOPY  06/02/2005   normal   WISDOM TOOTH EXTRACTION     Social History   Occupational History   Occupation: Glass blower/designer    Occupation: Programmer, systems  Tobacco Use   Smoking status: Some Days    Types: Cigars   Smokeless tobacco: Never   Tobacco comments:    every 3-4 months  Vaping Use   Vaping Use: Never used  Substance and Sexual Activity   Alcohol use: Yes    Alcohol/week: 0.0 standard drinks    Comment: drinks occasionally   Drug use: No   Sexual activity: Yes    Birth control/protection: Condom

## 2021-06-19 ENCOUNTER — Ambulatory Visit
Admission: RE | Admit: 2021-06-19 | Discharge: 2021-06-19 | Disposition: A | Discharge status: Home / Self Care | Payer: BC Managed Care – PPO | Source: Ambulatory Visit | Attending: Family Medicine | Admitting: Family Medicine

## 2021-06-19 ENCOUNTER — Other Ambulatory Visit: Payer: Self-pay | Admitting: Family Medicine

## 2021-06-19 ENCOUNTER — Ambulatory Visit
Admission: RE | Admit: 2021-06-19 | Discharge: 2021-06-19 | Disposition: A | Discharge status: Home / Self Care | Payer: No Typology Code available for payment source | Source: Ambulatory Visit | Attending: Family Medicine | Admitting: Family Medicine

## 2021-06-19 ENCOUNTER — Other Ambulatory Visit: Payer: Self-pay

## 2021-06-19 DIAGNOSIS — M47816 Spondylosis without myelopathy or radiculopathy, lumbar region: Secondary | ICD-10-CM

## 2021-06-19 NOTE — Discharge Instructions (Signed)

## 2021-06-27 ENCOUNTER — Telehealth: Payer: Self-pay | Admitting: Family Medicine

## 2021-06-27 NOTE — Telephone Encounter (Signed)
Patient called stating that the epidural done on 06/19/2021 has not helped. He spoke to [facility] and they told him to talk to Dr Tamala Julian about it.  Please advise.

## 2021-06-27 NOTE — Telephone Encounter (Signed)
Give it 2 weeks

## 2021-07-02 NOTE — Progress Notes (Signed)
[name] Phone: [phone] Subjective:    I'm seeing this patient by the request  of:  [name], MD  CC: low back pain   [id number]  05/08/2021 Patient has known facet arthropathy.  Patient did respond initially into a medial branch block and do feel that a second 1 could be beneficial.  Patient does respond to medial branch block again and then patient could be set up for the possibility of radiofrequency ablation.  We have had difficulty otherwise getting different injections approved for this individual.  Patient wants to avoid surgical intervention and hoping that when patient gets his knee taking care of the back pain will improve as well.  Bilateral injections given again.  Chronic problem with exacerbation.  At this point I do not think that this is going to be working with him more for this individual.  I do feel that now patient does need surgical intervention.  Discussed with patient that we do need to attempt to get BMI under 40.  Will refer to orthopedic surgery to discuss further.  Follow-up with me again as needed for this problem  Updated 07/03/2021 [name] is a 56 y.o. male coming in with complaint of bilateral knee and LBP. Knee feel like they are worse than last time. Same pain just intensified. LBP is the same as well. No interventions are working.  Patient is trying to lose weight is much as possible.  Will be getting a knee replacement when he can lose the weight.  Patient is still having difficulty doing so.  Feels he does need some improvement in the back.  Does feel that facet injections have been the most helpful.  Did not respond to the medial branch blocks and patient is not a candidate for radiofrequency ablation       Past Medical History:  Diagnosis Date   Anxiety    Arthritis    knees - no med   Cluster headache    Hx - resolved per patient   Depression    DVT of  lower extremity (deep venous thrombosis) ([place]) 6/26-28/2013   Xarelto - resolved - history   HTN (hypertension)    currently no meds   Irritability and anger    PMH of   Kidney stone    passed stone, no surgery required.   Mild intermittent asthma without complication 7/56/4332   Rotator cuff arthropathy 2007   right   Saddle pulmonary embolus ([place]) 6/26-28/2013   post orthopedic surgery - resolved   Sleep apnea 5/15   moderate OSA-has cpap but does not use it   Past Surgical History:  Procedure Laterality Date   arthroscopic knee surgery  10/09/2011   Dr Ninfa Linden   CARDIAC CATHETERIZATION  06/02/2005   LVdysfunction; normal coronaries   COLONOSCOPY     KNEE ARTHROSCOPY WITH MEDIAL MENISECTOMY Left 09/20/2014   medial menisectomy chondroplastytella medial plica excision  ;  Surgeon: Dorna Leitz, MD;  Location: [facility];  Service: Orthopedics;  Laterality: Left;   ROTATOR CUFF REPAIR  06/02/2005   right   UPPER GASTROINTESTINAL ENDOSCOPY  06/02/2005   normal   WISDOM TOOTH EXTRACTION     Social History   Socioeconomic History   Marital status: Married    Spouse name: Not on file   Number of children: 6   Years of education: 11   Highest education level: Not on file  Occupational History  Occupation: Glass blower/designer    Occupation: Programmer, systems  Tobacco Use   Smoking status: Some Days    Types: Cigars   Smokeless tobacco: Never   Tobacco comments:    every 3-4 months  Vaping Use   Vaping Use: Never used  Substance and Sexual Activity   Alcohol use: Yes    Alcohol/week: 0.0 standard drinks    Comment: drinks occasionally   Drug use: No   Sexual activity: Yes    Birth control/protection: Condom  Other Topics Concern   Not on file  Social History Narrative   Fun: Exercise   Social Determinants of Health   Financial Resource Strain: Not on file  Food Insecurity: Not on file  Transportation Needs: Not on file  Physical Activity: Not on  file  Stress: Not on file  Social Connections: Not on file   No Active Allergies Family History  Problem Relation Age of Onset   Cancer Mother        Mesothelioma    Diabetes Brother    Hypertension Brother    Stroke Brother 42   Heart attack Brother 74   Diabetes Maternal Aunt    Diabetes Maternal Grandmother    Heart disease Maternal Grandmother    Hypertension Maternal Grandmother    Stroke Maternal Grandmother        in 44s   Deep vein thrombosis Maternal Grandmother    Stroke Maternal Grandfather        > 77   Migraines Brother    Heart attack Brother 83   Healthy Father    Colon cancer Neg Hx    Esophageal cancer Neg Hx    Rectal cancer Neg Hx    Stomach cancer Neg Hx    Liver cancer Neg Hx    Pancreatic cancer Neg Hx    Other Neg Hx        low testosterone     Current Outpatient Medications (Cardiovascular):    nebivolol (BYSTOLIC) 5 MG tablet, TAKE 1 TABLET (5 MG TOTAL) BY MOUTH DAILY.   nitroGLYCERIN (NITROSTAT) 0.3 MG SL tablet, Place 0.3 mg under the tongue 2 (two) times daily as needed.   rosuvastatin (CRESTOR) 10 MG tablet, Take 1 tablet (10 mg total) by mouth daily.   sildenafil (REVATIO) 20 MG tablet, Take 3 tablets (60 mg total) by mouth daily as needed.  Current Outpatient Medications (Respiratory):    albuterol (VENTOLIN HFA) 108 (90 Base) MCG/ACT inhaler, Inhale 1-2 puffs into the lungs every 6 (six) hours as needed for wheezing or shortness of breath.   TRELEGY ELLIPTA 100-62.5-25 MCG/INH AEPB, Inhale 1 puff into the lungs daily.  Current Outpatient Medications (Analgesics):    traMADol (ULTRAM) 50 MG tablet, Take 1 tablet (50 mg total) by mouth every 6 (six) hours as needed.  Current Outpatient Medications (Hematological):    rivaroxaban (XARELTO) 10 MG TABS tablet, Take 1 tablet (10 mg total) by mouth daily.  Current Outpatient Medications (Other):    Armodafinil 250 MG tablet, Take 1 tablet (250 mg total) by mouth daily.   Cholecalciferol  50 MCG (2000 UT) TABS, Take 1 tablet (2,000 Units total) by mouth daily.   KLOR-CON M20 20 MEQ tablet, TAKE 1 TABLET BY MOUTH TWICE A DAY   Multiple Vitamin (MULTIVITAMIN WITH MINERALS) TABS tablet, Take 1 tablet by mouth daily.   pantoprazole (PROTONIX) 40 MG tablet, Take 1 tablet (40 mg total) by mouth daily.   Vitamin D, Ergocalciferol, (DRISDOL) 1.25 MG (50000 UNIT)  CAPS capsule, TAKE 1 CAPSULE (50,000 UNITS TOTAL) BY MOUTH EVERY 7 (SEVEN) DAYS.   Reviewed prior external information including notes and imaging from  primary care provider As well as notes that were available from care everywhere and other healthcare systems.  Reviewed patient's most recent injections.  Past medical history, social, surgical and family history all reviewed in electronic medical record.  No pertanent information unless stated regarding to the chief complaint.   Review of Systems:  No headache, visual changes, nausea, vomiting, diarrhea, constipation, dizziness, abdominal pain, skin rash, fevers, chills, night sweats, weight loss, swollen lymph nodes, body aches, joint swelling, chest pain, shortness of breath, mood changes. POSITIVE muscle aches, body aches, joint swelling  Objective  Blood pressure 126/84, pulse 74, height 5\' 7"  (1.702 m), weight 270 lb (122.5 kg), SpO2 98 %.   General: No apparent distress alert and oriented x3 mood and affect normal, dressed appropriately.  Overweight HEENT: Pupils equal, extraocular movements intact  Respiratory: Patient's speak in full sentences and does not appear short of breath  Cardiovascular: No lower extremity edema, non tender, no erythema  Gait antalgic. Knee arthritis noted.  Instability noted on the left knee with valgus and varus force.    Impression and Recommendations:    The above documentation has been reviewed and is accurate and complete Lyndal Pulley, DO

## 2021-07-03 ENCOUNTER — Ambulatory Visit (INDEPENDENT_AMBULATORY_CARE_PROVIDER_SITE_OTHER): Payer: No Typology Code available for payment source | Admitting: Family Medicine

## 2021-07-03 ENCOUNTER — Encounter: Payer: Self-pay | Admitting: Family Medicine

## 2021-07-03 ENCOUNTER — Other Ambulatory Visit: Payer: Self-pay

## 2021-07-03 VITALS — BP 126/84 | HR 74 | Ht 67.0 in | Wt 270.0 lb

## 2021-07-03 DIAGNOSIS — M545 Low back pain, unspecified: Secondary | ICD-10-CM | POA: Diagnosis not present

## 2021-07-03 DIAGNOSIS — G8929 Other chronic pain: Secondary | ICD-10-CM | POA: Diagnosis not present

## 2021-07-03 DIAGNOSIS — M170 Bilateral primary osteoarthritis of knee: Secondary | ICD-10-CM

## 2021-07-03 NOTE — Patient Instructions (Addendum)
Good to see you! [name] [phone] Call Today  Keep working on weight You know where we are if you need [place]

## 2021-07-03 NOTE — Assessment & Plan Note (Signed)
Patient is awaiting a weight loss and exercise and then he can potentially have knee replacement.  Follow-up with me for this problem as needed.

## 2021-07-03 NOTE — Assessment & Plan Note (Signed)
Known facet arthropathy and edema noted at the L4-L5.  Patient is not a candidate for radiofrequency ablation secondary to not responding to the medial branch block.  We discussed different medications or other types of injections which patient declined other than the facet injections again.  Patient will have these done and we will see how patient responds.

## 2021-07-16 ENCOUNTER — Other Ambulatory Visit: Payer: No Typology Code available for payment source

## 2021-07-17 ENCOUNTER — Ambulatory Visit
Admission: RE | Admit: 2021-07-17 | Discharge: 2021-07-17 | Disposition: A | Discharge status: Home / Self Care | Payer: No Typology Code available for payment source | Source: Ambulatory Visit | Attending: Family Medicine | Admitting: Family Medicine

## 2021-07-17 ENCOUNTER — Other Ambulatory Visit: Payer: Self-pay

## 2021-07-17 DIAGNOSIS — M545 Low back pain, unspecified: Secondary | ICD-10-CM

## 2021-07-17 DIAGNOSIS — G8929 Other chronic pain: Secondary | ICD-10-CM

## 2021-07-17 MED ORDER — IOPAMIDOL (ISOVUE-M 200) INJECTION 41%
1.0000 mL | Freq: Once | INTRAMUSCULAR | Status: AC
  Administered 2021-07-17: 1 mL via INTRA_ARTICULAR

## 2021-07-17 MED ORDER — METHYLPREDNISOLONE ACETATE 40 MG/ML INJ SUSP (RADIOLOG
80.0000 mg | Freq: Once | INTRAMUSCULAR | Status: AC
  Administered 2021-07-17: 80 mg via INTRA_ARTICULAR

## 2021-07-17 NOTE — Discharge Instructions (Signed)

## 2021-07-17 NOTE — Discharge Instructions (Signed)
Post Procedure Spinal Discharge Instruction Sheet  You may resume a regular diet and any medications that you routinely take (including pain medications) unless otherwise noted by MD.  No driving day of procedure.  Light activity throughout the rest of the day.  Do not do any strenuous work, exercise, bending or lifting.  The day following the procedure, you can resume normal physical activity but you should refrain from exercising or physical therapy for at least three days thereafter.  You may apply ice to the injection site, 20 minutes on, 20 minutes off, as needed. Do not apply ice directly to skin.    Common Side Effects:  Headaches- take your usual medications as directed by your physician.  Increase your fluid intake.  Caffeinated beverages may be helpful.  Lie flat in bed until your headache resolves.  Restlessness or inability to sleep- you may have trouble sleeping for the next few days.  Ask your referring physician if you need any medication for sleep.  Facial flushing or redness- should subside within a few days.  Increased pain- a temporary increase in pain a day or two following your procedure is not unusual.  Take your pain medication as prescribed by your referring physician.  Leg cramps  Please contact our office at [phone] for the following symptoms: Fever greater than 100 degrees. Headaches unresolved with medication after 2-3 days. Increased swelling, pain, or redness at injection site.   Thank you for visiting [facility] today.     YOU MAY RESUME XARELTO 24HRS AFTER PROCEDURE.

## 2021-07-27 ENCOUNTER — Other Ambulatory Visit: Payer: Self-pay | Admitting: Gastroenterology

## 2021-07-27 DIAGNOSIS — K2100 Gastro-esophageal reflux disease with esophagitis, without bleeding: Secondary | ICD-10-CM

## 2021-08-14 ENCOUNTER — Encounter: Payer: Self-pay | Admitting: Internal Medicine

## 2021-08-14 ENCOUNTER — Other Ambulatory Visit: Payer: Self-pay | Admitting: Internal Medicine

## 2021-08-14 ENCOUNTER — Ambulatory Visit (INDEPENDENT_AMBULATORY_CARE_PROVIDER_SITE_OTHER): Payer: No Typology Code available for payment source | Admitting: Internal Medicine

## 2021-08-14 ENCOUNTER — Other Ambulatory Visit: Payer: Self-pay

## 2021-08-14 VITALS — BP 144/86 | HR 80 | Temp 97.7°F | Resp 16 | Ht 67.0 in | Wt 267.0 lb

## 2021-08-14 DIAGNOSIS — E785 Hyperlipidemia, unspecified: Secondary | ICD-10-CM

## 2021-08-14 DIAGNOSIS — Z23 Encounter for immunization: Secondary | ICD-10-CM | POA: Diagnosis not present

## 2021-08-14 DIAGNOSIS — I10 Essential (primary) hypertension: Secondary | ICD-10-CM

## 2021-08-14 DIAGNOSIS — I8311 Varicose veins of right lower extremity with inflammation: Secondary | ICD-10-CM

## 2021-08-14 DIAGNOSIS — D6851 Activated protein C resistance: Secondary | ICD-10-CM

## 2021-08-14 MED ORDER — NEBIVOLOL HCL 5 MG PO TABS: 5.0000 mg | ORAL_TABLET | Freq: Every day | ORAL | 1 refills | Status: DC

## 2021-08-14 MED ORDER — ROSUVASTATIN CALCIUM 10 MG PO TABS: 10.0000 mg | ORAL_TABLET | Freq: Every day | ORAL | 1 refills | Status: DC

## 2021-08-14 MED ORDER — RIVAROXABAN 10 MG PO TABS: 10.0000 mg | ORAL_TABLET | Freq: Every day | ORAL | 1 refills | Status: DC

## 2021-08-14 MED ORDER — CARVEDILOL 3.125 MG PO TABS: 3.1250 mg | ORAL_TABLET | Freq: Two times a day (BID) | ORAL | 1 refills | Status: DC

## 2021-08-14 NOTE — Progress Notes (Signed)
Subjective:  Patient ID: [name], male    DOB: 21-Jul-1965  Age: 56 y.o. MRN: [id number]  CC: Hypertension and Hyperlipidemia  This visit occurred during the SARS-CoV-2 public health emergency.  Safety protocols were in place, including screening questions prior to the visit, additional usage of staff PPE, and extensive cleaning of exam room while observing appropriate contact time as indicated for disinfecting solutions.    HPI [name] presents for f/up -   He is concerned about a group of varicose veins on his right upper medial calf. They don't bleed but feel irritated. He is not taking xarelto or nebivolol because they got locked in a storage unit. He is active and denied CP, DOE, SOB, edema, or fatigue.  Outpatient Medications Prior to Visit  Medication Sig Dispense Refill   albuterol (VENTOLIN HFA) 108 (90 Base) MCG/ACT inhaler Inhale 1-2 puffs into the lungs every 6 (six) hours as needed for wheezing or shortness of breath. 25.5 g 3   Armodafinil 250 MG tablet Take 1 tablet (250 mg total) by mouth daily. 90 tablet 1   Cholecalciferol 50 MCG (2000 UT) TABS Take 1 tablet (2,000 Units total) by mouth daily. 90 tablet 1   KLOR-CON M20 20 MEQ tablet TAKE 1 TABLET BY MOUTH TWICE A DAY 180 tablet 1   Multiple Vitamin (MULTIVITAMIN WITH MINERALS) TABS tablet Take 1 tablet by mouth daily.     nitroGLYCERIN (NITROSTAT) 0.3 MG SL tablet Place 0.3 mg under the tongue 2 (two) times daily as needed.     pantoprazole (PROTONIX) 40 MG tablet TAKE 1 TABLET BY MOUTH EVERY DAY 90 tablet 1   sildenafil (REVATIO) 20 MG tablet Take 3 tablets (60 mg total) by mouth daily as needed. 60 tablet 5   traMADol (ULTRAM) 50 MG tablet Take 1 tablet (50 mg total) by mouth every 6 (six) hours as needed. 270 tablet 0   TRELEGY ELLIPTA 100-62.5-25 MCG/INH AEPB Inhale 1 puff into the lungs daily. 3 each 1   Vitamin D, Ergocalciferol, (DRISDOL) 1.25 MG (50000 UNIT) CAPS capsule TAKE 1 CAPSULE (50,000  UNITS TOTAL) BY MOUTH EVERY 7 (SEVEN) DAYS. 12 capsule 0   nebivolol (BYSTOLIC) 5 MG tablet TAKE 1 TABLET (5 MG TOTAL) BY MOUTH DAILY. 90 tablet 1   rivaroxaban (XARELTO) 10 MG TABS tablet Take 1 tablet (10 mg total) by mouth daily. 90 tablet 1   rosuvastatin (CRESTOR) 10 MG tablet Take 1 tablet (10 mg total) by mouth daily. 90 tablet 1   No facility-administered medications prior to visit.    ROS Review of Systems  Constitutional:  Negative for chills, diaphoresis, fatigue and fever.  HENT: Negative.    Eyes: Negative.   Respiratory:  Positive for apnea. Negative for cough, chest tightness, shortness of breath and wheezing.   Cardiovascular:  Negative for chest pain, palpitations and leg swelling.  Gastrointestinal:  Negative for abdominal pain, constipation, diarrhea, nausea and vomiting.  Endocrine: Negative.   Genitourinary: Negative.  Negative for difficulty urinating and hematuria.  Musculoskeletal:  Positive for arthralgias. Negative for myalgias.  Skin: Negative.   Neurological: Negative.  Negative for dizziness, weakness, light-headedness and headaches.  Hematological:  Negative for adenopathy. Does not bruise/bleed easily.  Psychiatric/Behavioral: Negative.     Objective:  BP (!) 144/86 (BP Location: Left Arm, Patient Position: Sitting, Cuff Size: Large)   Pulse 80   Temp 97.7 F (36.5 C) (Oral)   Resp 16   Ht 5\' 7"  (1.702 m)  Wt 267 lb (121.1 kg)   SpO2 94%   BMI 41.82 kg/m   BP Readings from Last 3 Encounters:  08/14/21 (!) 144/86  07/17/21 (!) 141/94  07/03/21 126/84    Wt Readings from Last 3 Encounters:  08/14/21 267 lb (121.1 kg)  07/03/21 270 lb (122.5 kg)  06/10/21 276 lb (125.2 kg)    Physical Exam Vitals reviewed.  Constitutional:      Appearance: Normal appearance.  HENT:     Nose: Nose normal.     Mouth/Throat:     Mouth: Mucous membranes are moist.  Eyes:     General: No scleral icterus.    Conjunctiva/sclera: Conjunctivae normal.   Cardiovascular:     Rate and Rhythm: Normal rate and regular rhythm.     Heart sounds: No murmur heard. Pulmonary:     Effort: Pulmonary effort is normal.     Breath sounds: No stridor. No wheezing, rhonchi or rales.  Abdominal:     General: Abdomen is flat.     Palpations: There is no mass.     Tenderness: There is no abdominal tenderness. There is no guarding.     Hernia: No hernia is present.  Musculoskeletal:     Cervical back: Neck supple.       Legs:  Lymphadenopathy:     Cervical: No cervical adenopathy.  Skin:    General: Skin is warm and dry.  Neurological:     General: No focal deficit present.     Mental Status: He is alert. Mental status is at baseline.  Psychiatric:        Mood and Affect: Mood normal.    Lab Results  Component Value Date   WBC 13.0 (H) 02/14/2021   HGB 14.2 02/14/2021   HCT 42.7 02/14/2021   PLT 235.0 02/14/2021   GLUCOSE 89 01/02/2021   CHOL 125 09/04/2020   TRIG 79 09/04/2020   HDL 52 09/04/2020   LDLCALC 57 09/04/2020   ALT 22 01/02/2021   AST 16 01/02/2021   NA 141 01/02/2021   K 3.6 01/02/2021   CL 104 01/02/2021   CREATININE 0.84 01/02/2021   BUN 10 01/02/2021   CO2 28 01/02/2021   TSH 1.48 09/04/2020   PSA 0.27 02/14/2021   INR CANCELED 08/12/2013   HGBA1C 5.7 (H) 12/22/2019    DG FACET JT INJ L /S SINGLE LEVEL LEFT W/FL/CT  Result Date: 07/17/2021 CLINICAL DATA:  Low back pain. Good response to prior facet injections. Less durable response from medial branch blocks. EXAM: RIGHT L4-5 FACET INJECTION UNDER FLUOROSCOPY LEFT L4-5 FACET INJECTION UNDER FLUOROSCOPY FLUOROSCOPY: Radiation Exposure Index (as provided by the fluoroscopic device): 6.9 MGy Kerma TECHNIQUE: Appropriate skin entry sites determined fluoroscopically and marked. Operator donned sterile gloves and mask. Sites prepped with betadine, draped in usual sterile fashion, and infiltrated locally with 1% lidocaine. A 22 gauge spinal needle was advanced to the  posterior margin of the right L4-5 facet under fluoroscopy. A 22 gauge spinal needle was advanced to the posterior margin of the left L4-5 facet under fluoroscopy. Diagnostic injection of 0.5 ml Isovue-M 200 at each level confirmed intra-articular/juxta-articular spread without any intrathecal or intravascular component. 80mg  Depo-Medrol in 2 mL bupivacaine 0.25% was divided between the 2 sites. The patient tolerated the procedure well. COMPLICATIONS: COMPLICATIONS None IMPRESSION: 1. Technically successful right L4-5 facet injection. 2. Technically successful left L4-5 facet injection. Electronically Signed   By: Corlis Leak M.D.   On: 07/17/2021 09:50   DG  FACET JT INJ L /S SINGLE LEVEL RIGHT W/FL/CT  Result Date: 07/17/2021 CLINICAL DATA:  Low back pain. Good response to prior facet injections. Less durable response from medial branch blocks. EXAM: RIGHT L4-5 FACET INJECTION UNDER FLUOROSCOPY LEFT L4-5 FACET INJECTION UNDER FLUOROSCOPY FLUOROSCOPY: Radiation Exposure Index (as provided by the fluoroscopic device): 6.9 MGy Kerma TECHNIQUE: Appropriate skin entry sites determined fluoroscopically and marked. Operator donned sterile gloves and mask. Sites prepped with betadine, draped in usual sterile fashion, and infiltrated locally with 1% lidocaine. A 22 gauge spinal needle was advanced to the posterior margin of the right L4-5 facet under fluoroscopy. A 22 gauge spinal needle was advanced to the posterior margin of the left L4-5 facet under fluoroscopy. Diagnostic injection of 0.5 ml Isovue-M 200 at each level confirmed intra-articular/juxta-articular spread without any intrathecal or intravascular component. 80mg  Depo-Medrol in 2 mL bupivacaine 0.25% was divided between the 2 sites. The patient tolerated the procedure well. COMPLICATIONS: COMPLICATIONS None IMPRESSION: 1. Technically successful right L4-5 facet injection. 2. Technically successful left L4-5 facet injection. Electronically Signed   By: Corlis Leak M.D.   On: 07/17/2021 09:50    Assessment & Plan:   [name] was seen today for hypertension and hyperlipidemia.  Diagnoses and all orders for this visit:  Essential hypertension- He has not achieved his BP goal. Will restart the BB. -     Discontinue: nebivolol (BYSTOLIC) 5 MG tablet; Take 1 tablet (5 mg total) by mouth daily. -     carvedilol (COREG) 3.125 MG tablet; Take 1 tablet (3.125 mg total) by mouth 2 (two) times daily with a meal.  Dyslipidemia, goal LDL below 100 -     rosuvastatin (CRESTOR) 10 MG tablet; Take 1 tablet (10 mg total) by mouth daily.  Heterozygous factor V Leiden mutation (HCC) -     rivaroxaban (XARELTO) 10 MG TABS tablet; Take 1 tablet (10 mg total) by mouth daily.  Varicose veins of right lower extremity with inflammation -     Ambulatory referral to Vascular Surgery  Other orders -     Varicella-zoster vaccine IM (Shingrix) -     Tdap vaccine greater than or equal to 7yo IM   I have discontinued Veverly [place]. [place]'s nebivolol and nebivolol. I am also having him start on carvedilol. Additionally, I am having him maintain his multivitamin with minerals, Cholecalciferol, Vitamin D (Ergocalciferol), Armodafinil, Trelegy Ellipta, albuterol, nitroGLYCERIN, traMADol, sildenafil, Klor-Con M20, pantoprazole, rosuvastatin, and rivaroxaban.  Meds ordered this encounter  Medications   DISCONTD: nebivolol (BYSTOLIC) 5 MG tablet    Sig: Take 1 tablet (5 mg total) by mouth daily.    Dispense:  90 tablet    Refill:  1   rosuvastatin (CRESTOR) 10 MG tablet    Sig: Take 1 tablet (10 mg total) by mouth daily.    Dispense:  90 tablet    Refill:  1   rivaroxaban (XARELTO) 10 MG TABS tablet    Sig: Take 1 tablet (10 mg total) by mouth daily.    Dispense:  90 tablet    Refill:  1   carvedilol (COREG) 3.125 MG tablet    Sig: Take 1 tablet (3.125 mg total) by mouth 2 (two) times daily with a meal.    Dispense:  180 tablet    Refill:  1     Follow-up:  Return in about 4 months (around 12/14/2021).  Sanda Linger, MD

## 2021-08-14 NOTE — Patient Instructions (Signed)

## 2021-08-17 DIAGNOSIS — I8311 Varicose veins of right lower extremity with inflammation: Secondary | ICD-10-CM | POA: Insufficient documentation

## 2021-08-17 DIAGNOSIS — E785 Hyperlipidemia, unspecified: Secondary | ICD-10-CM | POA: Insufficient documentation

## 2021-08-20 ENCOUNTER — Telehealth: Payer: Self-pay | Admitting: Internal Medicine

## 2021-08-20 NOTE — Telephone Encounter (Signed)
Pt need clarification on what BP med he should be taking. I informed him that he should d/c nebvilol per 3/15 OV note and that carvedilol prescribed in place of it. He expressed understanding.  ?

## 2021-08-20 NOTE — Telephone Encounter (Signed)
Pt requesting a cb "to discuss something about my bp medicine" ? ?Phone [phone] ?

## 2021-09-06 ENCOUNTER — Other Ambulatory Visit: Payer: Self-pay | Admitting: Internal Medicine

## 2021-09-06 DIAGNOSIS — M170 Bilateral primary osteoarthritis of knee: Secondary | ICD-10-CM

## 2021-09-09 ENCOUNTER — Encounter: Payer: Self-pay | Admitting: Orthopaedic Surgery

## 2021-09-09 ENCOUNTER — Ambulatory Visit (INDEPENDENT_AMBULATORY_CARE_PROVIDER_SITE_OTHER): Payer: No Typology Code available for payment source | Admitting: Orthopaedic Surgery

## 2021-09-09 DIAGNOSIS — M1712 Unilateral primary osteoarthritis, left knee: Secondary | ICD-10-CM

## 2021-09-09 DIAGNOSIS — M25561 Pain in right knee: Secondary | ICD-10-CM | POA: Diagnosis not present

## 2021-09-09 DIAGNOSIS — M25562 Pain in left knee: Secondary | ICD-10-CM | POA: Diagnosis not present

## 2021-09-09 DIAGNOSIS — G8929 Other chronic pain: Secondary | ICD-10-CM

## 2021-09-09 DIAGNOSIS — M1711 Unilateral primary osteoarthritis, right knee: Secondary | ICD-10-CM | POA: Diagnosis not present

## 2021-09-09 MED ORDER — METHYLPREDNISOLONE ACETATE 40 MG/ML IJ SUSP
40.0000 mg | INTRAMUSCULAR | Status: AC | PRN
  Administered 2021-09-09: 40 mg via INTRA_ARTICULAR

## 2021-09-09 MED ORDER — LIDOCAINE HCL 1 % IJ SOLN
3.0000 mL | INTRAMUSCULAR | Status: AC | PRN
  Administered 2021-09-09: 3 mL

## 2021-09-09 NOTE — Progress Notes (Signed)
? ?Office Visit Note ?  ?Patient: [name]           ?Date of Birth: June 18, 1965           ?MRN: [id number] ?Visit Date: 09/09/2021 ?             ?Requested by: [name], MD ?[place],  [place] [address] ?PCP: [name], MD ? ? ?Assessment & Plan: ?Visit Diagnoses:  ?1. Severe obesity (BMI >= 40) (HCC)   ?2. Chronic pain of left knee   ?3. Chronic pain of right knee   ?4. Unilateral primary osteoarthritis, right knee   ?5. Unilateral primary osteoarthritis, left knee   ? ? ?Plan: He is getting closer for [place] to be able to schedule surgery safely for him.  He does not have a large soft tissue envelope around either knee.  He is only 5 foot 7.  I did place steroid injections in both knees today to help temporize his pain since has been over 26-monthsince his last injections.  I would like to see him back in just 6 weeks.  If he has lost more weight at that standpoint, we will work on scheduling him for knee replacement.  I would like a new weight and BMI calculation at that visit. ? ?Follow-Up Instructions: Return in about 6 weeks (around 10/21/2021).  ? ?Orders:  ?No orders of the defined types were placed in this encounter. ? ?No orders of the defined types were placed in this encounter. ? ? ? ? Procedures: ?Large Joint Inj: R knee on 09/09/2021 4:06 PM ?Indications: diagnostic evaluation and pain ?Details: 22 G 1.5 in needle, superolateral approach ? ?Arthrogram: No ? ?Medications: 3 mL lidocaine 1 %; 40 mg methylPREDNISolone acetate 40 MG/ML ?Outcome: tolerated well, no immediate complications ?Procedure, treatment alternatives, risks and benefits explained, specific risks discussed. Consent was given by the patient. Immediately prior to procedure a time out was called to verify the correct patient, procedure, equipment, support staff and site/side marked as required. Patient was prepped and draped in the usual sterile fashion.  ? ? ?Large Joint Inj: L knee on 09/09/2021 4:06  PM ?Indications: diagnostic evaluation and pain ?Details: 22 G 1.5 in needle, superolateral approach ? ?Arthrogram: No ? ?Medications: 3 mL lidocaine 1 %; 40 mg methylPREDNISolone acetate 40 MG/ML ?Outcome: tolerated well, no immediate complications ?Procedure, treatment alternatives, risks and benefits explained, specific risks discussed. Consent was given by the patient. Immediately prior to procedure a time out was called to verify the correct patient, procedure, equipment, support staff and site/side marked as required. Patient was prepped and draped in the usual sterile fashion.  ? ? ? ? ?Clinical Data: ?No additional findings. ? ? ?Subjective: ?Chief Complaint  ?Patient presents with  ? Right Knee - Follow-up  ? Left Knee - Follow-up  ?The patient comes in for follow-up as a relates to severe arthritis of both his knees.  He last had steroid injections I believe in early January and his knees.  He has well-documented end-stage arthritis of the knees.  However his obesity has kept [place].  At his last visit his BMI was over 43.  He has cut sodas out of his diet and is worked on caloric intake as well as trying to drop some of the carbohydrates out of his diet.  His BMI today is 41.5.  Both knees hurt quite a bit with the right knee worse at  times in the left knee worse at times. ? ?HPI ? ?Review of Systems ?There is currently listed no fever, chills, nausea, vomiting ? ?Objective: ?Vital Signs: Ht '5\' 7"'$  (1.702 m)   Wt 265 lb (120.2 kg)   BMI 41.50 kg/m?  ? ?Physical Exam ?He is alert and orient x3 and in no acute distress ?Ortho Exam ?Both knees have varus malalignment with no effusion.  Both knees have a painful arc of motion and global tenderness with patellofemoral crepitation. ?Specialty Comments:  ?No specialty comments available. ? ?Imaging: ?No results found. ? ? ?PMFS History: ?Patient Active Problem List  ? Diagnosis Date Noted  ? Varicose veins of right lower  extremity with inflammation 08/17/2021  ? Dyslipidemia, goal LDL below 100 08/17/2021  ? Encounter for general adult medical examination with abnormal findings 02/14/2021  ? Chronic anticoagulation 01/22/2021  ? Mild intermittent asthma without complication 73/22/0254  ? Polyp of colon 11/13/2020  ? Hypogonadism male 02/10/2020  ? Erectile dysfunction due to arterial insufficiency 02/08/2020  ? Intrinsic eczema 02/08/2020  ? H. pylori infection 01/25/2020  ? Low back pain 12/23/2019  ? Excessive somnolence disorder 05/09/2019  ? AC (acromioclavicular) arthritis 05/03/2019  ? Subacromial bursitis of right shoulder joint 05/03/2019  ? Ejection fraction < 50% 02/03/2019  ? Vitamin D deficiency disease 11/03/2018  ? PUD (peptic ulcer disease) 01/20/2018  ? Renal stone 08/26/2017  ? Morbid obesity ([place]) 08/25/2017  ? GERD with esophagitis 08/24/2017  ? Degenerative arthritis of knee, bilateral 06/08/2017  ? Venous insufficiency of both lower extremities 10/05/2015  ? Heterozygous factor V Leiden mutation ([place]) 08/24/2013  ? OSA (obstructive sleep apnea) 08/13/2013  ? Incomplete right bundle branch block 06/18/2011  ? Mixed hyperlipidemia 07/06/2008  ? Essential hypertension 07/06/2008  ? ERECTILE DYSFUNCTION, ORGANIC 01/28/2007  ? ?Past Medical History:  ?Diagnosis Date  ? Anxiety   ? Arthritis   ? knees - no med  ? Cluster headache   ? Hx - resolved per patient  ? Depression   ? DVT of lower extremity (deep venous thrombosis) ([place]) 6/26-28/2013  ? Xarelto - resolved - history  ? HTN (hypertension)   ? currently no meds  ? Irritability and anger   ? PMH of  ? Kidney stone   ? passed stone, no surgery required.  ? Mild intermittent asthma without complication 2/70/6237  ? Rotator cuff arthropathy 2007  ? right  ? Saddle pulmonary embolus ([place]) 6/26-28/2013  ? post orthopedic surgery - resolved  ? Sleep apnea 5/15  ? moderate OSA-has cpap but does not use it  ?  ?Family History  ?Problem Relation Age of Onset  ? Cancer  Mother   ?     Mesothelioma   ? Diabetes Brother   ? Hypertension Brother   ? Stroke Brother 86  ? Heart attack Brother 45  ? Diabetes Maternal Aunt   ? Diabetes Maternal Grandmother   ? Heart disease Maternal Grandmother   ? Hypertension Maternal Grandmother   ? Stroke Maternal Grandmother   ?     in 69s  ? Deep vein thrombosis Maternal Grandmother   ? Stroke Maternal Grandfather   ?     > 55  ? Migraines Brother   ? Heart attack Brother 46  ? Healthy Father   ? Colon cancer Neg Hx   ? Esophageal cancer Neg Hx   ? Rectal cancer Neg Hx   ? Stomach cancer Neg Hx   ? Liver cancer Neg Hx   ?  Pancreatic cancer Neg Hx   ? Other Neg Hx   ?     low testosterone  ?  ?Past Surgical History:  ?Procedure Laterality Date  ? arthroscopic knee surgery  10/09/2011  ? Dr Ninfa Linden  ? CARDIAC CATHETERIZATION  06/02/2005  ? LVdysfunction; normal coronaries  ? COLONOSCOPY    ? KNEE ARTHROSCOPY WITH MEDIAL MENISECTOMY Left 09/20/2014  ? medial menisectomy chondroplastytella medial plica excision  ;  Surgeon: Dorna Leitz, MD;  Location: [facility];  Service: Orthopedics;  Laterality: Left;  ? ROTATOR CUFF REPAIR  06/02/2005  ? right  ? UPPER GASTROINTESTINAL ENDOSCOPY  06/02/2005  ? normal  ? WISDOM TOOTH EXTRACTION    ? ?Social History  ? ?Occupational History  ? Occupation: Glass blower/designer   ? Occupation: Programmer, systems  ?Tobacco Use  ? Smoking status: Some Days  ?  Types: Cigars  ?  Passive exposure: Never  ? Smokeless tobacco: Never  ? Tobacco comments:  ?  every 3-4 months  ?Vaping Use  ? Vaping Use: Never used  ?Substance and Sexual Activity  ? Alcohol use: Yes  ?  Alcohol/week: 0.0 standard drinks  ?  Comment: drinks occasionally  ? Drug use: No  ? Sexual activity: Yes  ?  Partners: Female  ?  Birth control/protection: Condom  ? ? ? ? ? ? ?

## 2021-09-20 ENCOUNTER — Other Ambulatory Visit: Payer: Self-pay | Admitting: Internal Medicine

## 2021-09-20 DIAGNOSIS — N529 Male erectile dysfunction, unspecified: Secondary | ICD-10-CM

## 2021-09-20 NOTE — Progress Notes (Deleted)
FFM:[id number] chest pain.  Cardiac catheterization 2007 showed normal coronary arteries with distal LAD intramyocardial bridge without obstruction.  Nuclear study June 2020 showed ejection fraction 51% with normal perfusion.  Echocardiogram September 2020 showed normal LV function, trace mitral regurgitation, mild tricuspid regurgitation.  Seen with chest pain April 2022.  Troponins normal.  Hemoglobin 14 and liver functions normal.  CTA April 2022 showed no pulmonary embolus.  Since last seen   Current Outpatient Medications  Medication Sig Dispense Refill   albuterol (VENTOLIN HFA) 108 (90 Base) MCG/ACT inhaler Inhale 1-2 puffs into the lungs every 6 (six) hours as needed for wheezing or shortness of breath. 25.5 g 3   Armodafinil 250 MG tablet Take 1 tablet (250 mg total) by mouth daily. 90 tablet 1   carvedilol (COREG) 3.125 MG tablet Take 1 tablet (3.125 mg total) by mouth 2 (two) times daily with a meal. 180 tablet 1   Cholecalciferol 50 MCG (2000 UT) TABS Take 1 tablet (2,000 Units total) by mouth daily. 90 tablet 1   KLOR-CON M20 20 MEQ tablet TAKE 1 TABLET BY MOUTH TWICE A DAY 180 tablet 1   Multiple Vitamin (MULTIVITAMIN WITH MINERALS) TABS tablet Take 1 tablet by mouth daily.     nitroGLYCERIN (NITROSTAT) 0.3 MG SL tablet Place 0.3 mg under the tongue 2 (two) times daily as needed.     pantoprazole (PROTONIX) 40 MG tablet TAKE 1 TABLET BY MOUTH EVERY DAY 90 tablet 1   rivaroxaban (XARELTO) 10 MG TABS tablet Take 1 tablet (10 mg total) by mouth daily. 90 tablet 1   rosuvastatin (CRESTOR) 10 MG tablet Take 1 tablet (10 mg total) by mouth daily. 90 tablet 1   sildenafil (REVATIO) 20 MG tablet Take 3 tablets (60 mg total) by mouth daily as needed. 60 tablet 5   traMADol (ULTRAM) 50 MG tablet Take 1 tablet (50 mg total) by mouth every 6 (six) hours as needed. 90 tablet 2   TRELEGY ELLIPTA 100-62.5-25 MCG/INH AEPB Inhale 1 puff into the lungs daily. 3 each 1   Vitamin D,  Ergocalciferol, (DRISDOL) 1.25 MG (50000 UNIT) CAPS capsule TAKE 1 CAPSULE (50,000 UNITS TOTAL) BY MOUTH EVERY 7 (SEVEN) DAYS. 12 capsule 0   No current facility-administered medications for this visit.     Past Medical History:  Diagnosis Date   Anxiety    Arthritis    knees - no med   Cluster headache    Hx - resolved per patient   Depression    DVT of lower extremity (deep venous thrombosis) ([place]) 6/26-28/2013   Xarelto - resolved - history   HTN (hypertension)    currently no meds   Irritability and anger    PMH of   Kidney stone    passed stone, no surgery required.   Mild intermittent asthma without complication 3/57/0177   Rotator cuff arthropathy 2007   right   Saddle pulmonary embolus ([place]) 6/26-28/2013   post orthopedic surgery - resolved   Sleep apnea 5/15   moderate OSA-has cpap but does not use it    Past Surgical History:  Procedure Laterality Date   arthroscopic knee surgery  10/09/2011   Dr Ninfa Linden   CARDIAC CATHETERIZATION  06/02/2005   LVdysfunction; normal coronaries   COLONOSCOPY     KNEE ARTHROSCOPY WITH MEDIAL MENISECTOMY Left 09/20/2014   medial menisectomy chondroplastytella medial plica excision  ;  Surgeon: Dorna Leitz, MD;  Location: [facility];  Service: Orthopedics;  Laterality: Left;   ROTATOR CUFF REPAIR  06/02/2005   right   UPPER GASTROINTESTINAL ENDOSCOPY  06/02/2005   normal   WISDOM TOOTH EXTRACTION      Social History   Socioeconomic History   Marital status: Married    Spouse name: Not on file   Number of children: 6   Years of education: 11   Highest education level: Not on file  Occupational History   Occupation: Glass blower/designer    Occupation: Programmer, systems  Tobacco Use   Smoking status: Some Days    Types: Cigars    Passive exposure: Never   Smokeless tobacco: Never   Tobacco comments:    every 3-4 months  Vaping Use   Vaping Use: Never used  Substance and Sexual Activity   Alcohol use:  Yes    Alcohol/week: 0.0 standard drinks    Comment: drinks occasionally   Drug use: No   Sexual activity: Yes    Partners: Female    Birth control/protection: Condom  Other Topics Concern   Not on file  Social History Narrative   Fun: Exercise   Social Determinants of Health   Financial Resource Strain: Not on file  Food Insecurity: Not on file  Transportation Needs: Not on file  Physical Activity: Not on file  Stress: Not on file  Social Connections: Not on file  Intimate Partner Violence: Not on file    Family History  Problem Relation Age of Onset   Cancer Mother        Mesothelioma    Diabetes Brother    Hypertension Brother    Stroke Brother 22   Heart attack Brother 22   Diabetes Maternal Aunt    Diabetes Maternal Grandmother    Heart disease Maternal Grandmother    Hypertension Maternal Grandmother    Stroke Maternal Grandmother        in 89s   Deep vein thrombosis Maternal Grandmother    Stroke Maternal Grandfather        > 31   Migraines Brother    Heart attack Brother 65   Healthy Father    Colon cancer Neg Hx    Esophageal cancer Neg Hx    Rectal cancer Neg Hx    Stomach cancer Neg Hx    Liver cancer Neg Hx    Pancreatic cancer Neg Hx    Other Neg Hx        low testosterone    ROS: no fevers or chills, productive cough, hemoptysis, dysphasia, odynophagia, melena, hematochezia, dysuria, hematuria, rash, seizure activity, orthopnea, PND, pedal edema, claudication. Remaining systems are negative.  Physical Exam: Well-developed well-nourished in no acute distress.  Skin is warm and dry.  HEENT is normal.  Neck is supple.  Chest is clear to auscultation with normal expansion.  Cardiovascular exam is regular rate and rhythm.  Abdominal exam nontender or distended. No masses palpated. Extremities show no edema. neuro grossly intact  ECG- personally reviewed  A/P  1 chest pain-patient has a long history of atypical chest pain.  We will arrange  a cardiac CTA to exclude obstructive coronary disease.  Note his electrocardiogram shows no ST changes.  2 hypertension-blood pressure controlled today.  Continue present medical regimen.  3 hyperlipidemia-continue statin.  Kirk Ruths, MD

## 2021-09-23 ENCOUNTER — Encounter: Payer: Self-pay | Admitting: Vascular Surgery

## 2021-09-23 ENCOUNTER — Other Ambulatory Visit: Payer: Self-pay | Admitting: *Deleted

## 2021-09-23 DIAGNOSIS — I872 Venous insufficiency (chronic) (peripheral): Secondary | ICD-10-CM

## 2021-09-26 ENCOUNTER — Ambulatory Visit: Payer: BC Managed Care – PPO | Admitting: Cardiology

## 2021-10-04 ENCOUNTER — Encounter: Payer: No Typology Code available for payment source | Admitting: Vascular Surgery

## 2021-10-04 ENCOUNTER — Encounter (HOSPITAL_COMMUNITY): Payer: No Typology Code available for payment source

## 2021-10-18 ENCOUNTER — Ambulatory Visit (HOSPITAL_COMMUNITY)
Admission: RE | Admit: 2021-10-18 | Discharge: 2021-10-18 | Disposition: A | Discharge status: Home / Self Care | Payer: No Typology Code available for payment source | Source: Ambulatory Visit | Attending: Vascular Surgery | Admitting: Vascular Surgery

## 2021-10-18 ENCOUNTER — Encounter: Payer: Self-pay | Admitting: Vascular Surgery

## 2021-10-18 ENCOUNTER — Ambulatory Visit (INDEPENDENT_AMBULATORY_CARE_PROVIDER_SITE_OTHER): Payer: No Typology Code available for payment source | Admitting: Vascular Surgery

## 2021-10-18 VITALS — BP 150/94 | HR 72 | Temp 97.9°F | Resp 20 | Ht 67.0 in | Wt 262.6 lb

## 2021-10-18 DIAGNOSIS — I872 Venous insufficiency (chronic) (peripheral): Secondary | ICD-10-CM | POA: Insufficient documentation

## 2021-10-18 NOTE — Progress Notes (Signed)
Office Note     CC: Bilateral lower extremity varicosities Requesting Provider:  [name], MD  HPI: [name] is a 56 y.o. (1966/02/13) male who presents at the request of [name], MD for evaluation of bilateral lower extremity varicosities.  Ruven first appreciated bilateral lower extremity varicosities several years ago.  These have been associated with pain and heaviness, however this waxes and wanes.  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  He has a history of bilateral knee pain as well as back pain requiring steroid injections.  He has had arthroscopic surgery on the left and has been offered knee replacement surgery, currently holding off due to his young age.  From a venous standpoint, he has some heavy, aching, tired feeling by days and, however this is not lifestyle limiting.  He denies burning, throbbing, itching.  He has no history of bleeding or ulceration.  Annie does have a history of DVT and PE in the left leg several years ago.  No history of previous vein procedures  Denies symptoms of claudication, ischemic rest pain, tissue loss   Past Medical History:  Diagnosis Date   Anxiety    Arthritis    knees - no med   Cluster headache    Hx - resolved per patient   Depression    DVT of lower extremity (deep venous thrombosis) ([place]) 6/26-28/2013   Xarelto - resolved - history   HTN (hypertension)    currently no meds   Irritability and anger    PMH of   Kidney stone    passed stone, no surgery required.   Mild intermittent asthma without complication 9/37/9024   Rotator cuff arthropathy 2007   right   Saddle pulmonary embolus ([place]) 6/26-28/2013   post orthopedic surgery - resolved   Sleep apnea 5/15   moderate OSA-has cpap but does not use it    Past Surgical History:  Procedure Laterality Date   arthroscopic knee surgery  10/09/2011   Dr Ninfa Linden   CARDIAC CATHETERIZATION  06/02/2005    LVdysfunction; normal coronaries   COLONOSCOPY     KNEE ARTHROSCOPY WITH MEDIAL MENISECTOMY Left 09/20/2014   medial menisectomy chondroplastytella medial plica excision  ;  Surgeon: Dorna Leitz, MD;  Location: [facility];  Service: Orthopedics;  Laterality: Left;   ROTATOR CUFF REPAIR  06/02/2005   right   UPPER GASTROINTESTINAL ENDOSCOPY  06/02/2005   normal   WISDOM TOOTH EXTRACTION      Social History   Socioeconomic History   Marital status: Married    Spouse name: Not on file   Number of children: 6   Years of education: 11   Highest education level: Not on file  Occupational History   Occupation: Glass blower/designer    Occupation: Programmer, systems  Tobacco Use   Smoking status: Some Days    Types: Cigars    Passive exposure: Never   Smokeless tobacco: Never   Tobacco comments:    every 3-4 months  Vaping Use   Vaping Use: Never used  Substance and Sexual Activity   Alcohol use: Yes    Alcohol/week: 0.0 standard drinks    Comment: drinks occasionally   Drug use: No   Sexual activity: Yes    Partners: Female    Birth control/protection: Condom  Other Topics Concern   Not on file  Social History Narrative   Fun: Exercise   Social Determinants  of Health   Financial Resource Strain: Not on file  Food Insecurity: Not on file  Transportation Needs: Not on file  Physical Activity: Not on file  Stress: Not on file  Social Connections: Not on file  Intimate Partner Violence: Not on file   Family History  Problem Relation Age of Onset   Cancer Mother        Mesothelioma    Diabetes Brother    Hypertension Brother    Stroke Brother 74   Heart attack Brother 72   Diabetes Maternal Aunt    Diabetes Maternal Grandmother    Heart disease Maternal Grandmother    Hypertension Maternal Grandmother    Stroke Maternal Grandmother        in 74s   Deep vein thrombosis Maternal Grandmother    Stroke Maternal Grandfather        > 64   Migraines Brother     Heart attack Brother 16   Healthy Father    Colon cancer Neg Hx    Esophageal cancer Neg Hx    Rectal cancer Neg Hx    Stomach cancer Neg Hx    Liver cancer Neg Hx    Pancreatic cancer Neg Hx    Other Neg Hx        low testosterone    Current Outpatient Medications  Medication Sig Dispense Refill   albuterol (VENTOLIN HFA) 108 (90 Base) MCG/ACT inhaler Inhale 1-2 puffs into the lungs every 6 (six) hours as needed for wheezing or shortness of breath. 25.5 g 3   Armodafinil 250 MG tablet Take 1 tablet (250 mg total) by mouth daily. 90 tablet 1   carvedilol (COREG) 3.125 MG tablet Take 1 tablet (3.125 mg total) by mouth 2 (two) times daily with a meal. 180 tablet 1   Cholecalciferol 50 MCG (2000 UT) TABS Take 1 tablet (2,000 Units total) by mouth daily. 90 tablet 1   KLOR-CON M20 20 MEQ tablet TAKE 1 TABLET BY MOUTH TWICE A DAY 180 tablet 1   Multiple Vitamin (MULTIVITAMIN WITH MINERALS) TABS tablet Take 1 tablet by mouth daily.     nitroGLYCERIN (NITROSTAT) 0.3 MG SL tablet Place 0.3 mg under the tongue 2 (two) times daily as needed.     pantoprazole (PROTONIX) 40 MG tablet TAKE 1 TABLET BY MOUTH EVERY DAY 90 tablet 1   rivaroxaban (XARELTO) 10 MG TABS tablet Take 1 tablet (10 mg total) by mouth daily. 90 tablet 1   rosuvastatin (CRESTOR) 10 MG tablet Take 1 tablet (10 mg total) by mouth daily. 90 tablet 1   sildenafil (REVATIO) 20 MG tablet Take 3 tablets (60 mg total) by mouth daily as needed. 60 tablet 5   traMADol (ULTRAM) 50 MG tablet Take 1 tablet (50 mg total) by mouth every 6 (six) hours as needed. 90 tablet 2   TRELEGY ELLIPTA 100-62.5-25 MCG/INH AEPB Inhale 1 puff into the lungs daily. 3 each 1   Vitamin D, Ergocalciferol, (DRISDOL) 1.25 MG (50000 UNIT) CAPS capsule TAKE 1 CAPSULE (50,000 UNITS TOTAL) BY MOUTH EVERY 7 (SEVEN) DAYS. 12 capsule 0   No current facility-administered medications for this visit.    No Known Allergies   REVIEW OF SYSTEMS:  '[X]'$  denotes  positive finding, '[ ]'$  denotes negative finding Cardiac  Comments:  Chest pain or chest pressure:    Shortness of breath upon exertion:    Short of breath when lying flat:    Irregular heart rhythm:  Vascular    Pain in calf, thigh, or hip brought on by ambulation:    Pain in feet at night that wakes you up from your sleep:     Blood clot in your veins:    Leg swelling:         Pulmonary    Oxygen at home:    Productive cough:     Wheezing:         Neurologic    Sudden weakness in arms or legs:     Sudden numbness in arms or legs:     Sudden onset of difficulty speaking or slurred speech:    Temporary loss of vision in one eye:     Problems with dizziness:         Gastrointestinal    Blood in stool:     Vomited blood:         Genitourinary    Burning when urinating:     Blood in urine:        Psychiatric    Major depression:         Hematologic    Bleeding problems:    Problems with blood clotting too easily:        Skin    Rashes or ulcers:        Constitutional    Fever or chills:      PHYSICAL EXAMINATION:  Vitals:   10/18/21 1536  BP: (!) 150/94  Pulse: 72  Resp: 20  Temp: 97.9 F (36.6 C)  SpO2: 96%  Weight: 262 lb 9.6 oz (119.1 kg)  Height: '5\' 7"'$  (1.702 m)    General:  WDWN in NAD; vital signs documented above Gait: Not observed HENT: WNL, normocephalic Pulmonary: normal non-labored breathing , without Rales, rhonchi,  wheezing Cardiac: regular HR Abdomen: soft, NT, no masses Skin: without rashes Vascular Exam/Pulses:  Right Left  Radial 2+ (normal) 2+ (normal)  Ulnar 2+ (normal) 2+ (normal)  Femoral    Popliteal    DP 2+ (normal) 2+ (normal)  PT 2+ (normal) 2+ (normal)   Extremities: without ischemic changes, without Gangrene , without cellulitis; without open wounds;  Musculoskeletal: no muscle wasting or atrophy  Neurologic: A&O X 3;  No focal weakness or paresthesias are detected Psychiatric:  The pt has Normal  affect.   Non-Invasive Vascular Imaging:    Right:  - No evidence of deep vein thrombosis seen in the right lower extremity,  from the common femoral through the popliteal veins.  - Venous reflux is noted in the right greater saphenous vein in the thigh.  - Venous reflux is noted in the right short saphenous vein.     ASSESSMENT/PLAN:: 56 y.o. male presenting with bilateral lower extremity varicosities appreciated in the popliteal fossa as well as telangiectasias on bilateral calves.  Imaging was reviewed demonstrating focal areas of reflux within the greater saphenous vein and right short saphenous vein.  On physical exam, he had palpable pulses in bilateral feet.  He is relatively asymptomatic, and while he does note some heaviness in the legs, describes this as being nonlifestyle limiting.    I had a long discussion with Dominik regarding the above.  With his current symptoms, we discussed the role of bilateral lower extremity compression stockings in an effort to improve the heavy, tired feeling he has by days end.  We discussed that in the future, he may require greater saphenous vein ablation should his symptoms worsen.  At this time, he can follow-up with  me as needed.   Broadus John, MD [facility] [phone]

## 2021-10-21 ENCOUNTER — Ambulatory Visit: Payer: No Typology Code available for payment source | Admitting: Orthopaedic Surgery

## 2021-11-04 ENCOUNTER — Telehealth: Payer: Self-pay | Admitting: Family Medicine

## 2021-11-04 ENCOUNTER — Other Ambulatory Visit: Payer: Self-pay

## 2021-11-04 DIAGNOSIS — M5459 Other low back pain: Secondary | ICD-10-CM

## 2021-11-04 NOTE — Telephone Encounter (Signed)
Injections ordered. Patient notified.

## 2021-11-04 NOTE — Telephone Encounter (Signed)
Patient called stating that he is having reoccurring back pain and asked if another epidural could be ordered for him?  Please advise.

## 2021-11-07 ENCOUNTER — Ambulatory Visit: Payer: No Typology Code available for payment source | Admitting: Orthopaedic Surgery

## 2021-11-15 ENCOUNTER — Ambulatory Visit
Admission: RE | Admit: 2021-11-15 | Discharge: 2021-11-15 | Disposition: A | Discharge status: Home / Self Care | Payer: No Typology Code available for payment source | Source: Ambulatory Visit | Attending: Family Medicine | Admitting: Family Medicine

## 2021-11-15 DIAGNOSIS — M5459 Other low back pain: Secondary | ICD-10-CM

## 2021-11-15 MED ORDER — METHYLPREDNISOLONE ACETATE 40 MG/ML INJ SUSP (RADIOLOG
80.0000 mg | Freq: Once | INTRAMUSCULAR | Status: AC
  Administered 2021-11-15: 80 mg via INTRA_ARTICULAR

## 2021-11-15 NOTE — Discharge Instructions (Signed)

## 2021-12-09 ENCOUNTER — Telehealth: Payer: Self-pay | Admitting: Internal Medicine

## 2021-12-09 NOTE — Telephone Encounter (Signed)
Note not needed 

## 2021-12-16 ENCOUNTER — Ambulatory Visit (INDEPENDENT_AMBULATORY_CARE_PROVIDER_SITE_OTHER): Payer: No Typology Code available for payment source | Admitting: Internal Medicine

## 2021-12-16 ENCOUNTER — Encounter: Payer: Self-pay | Admitting: Internal Medicine

## 2021-12-16 VITALS — BP 138/88 | HR 90 | Temp 97.7°F | Ht 67.0 in | Wt 264.0 lb

## 2021-12-16 DIAGNOSIS — Z23 Encounter for immunization: Secondary | ICD-10-CM | POA: Diagnosis not present

## 2021-12-16 DIAGNOSIS — E785 Hyperlipidemia, unspecified: Secondary | ICD-10-CM

## 2021-12-16 DIAGNOSIS — I10 Essential (primary) hypertension: Secondary | ICD-10-CM | POA: Diagnosis not present

## 2021-12-16 DIAGNOSIS — J4520 Mild intermittent asthma, uncomplicated: Secondary | ICD-10-CM

## 2021-12-16 LAB — CBC WITH DIFFERENTIAL/PLATELET
Basophils Absolute: 0 10*3/uL (ref 0.0–0.1)
Basophils Relative: 0.6 % (ref 0.0–3.0)
Eosinophils Absolute: 0.3 10*3/uL (ref 0.0–0.7)
Eosinophils Relative: 3.5 % (ref 0.0–5.0)
HCT: 40.2 % (ref 39.0–52.0)
Hemoglobin: 13.4 g/dL (ref 13.0–17.0)
Lymphocytes Relative: 29.8 % (ref 12.0–46.0)
Lymphs Abs: 2.6 10*3/uL (ref 0.7–4.0)
MCHC: 33.3 g/dL (ref 30.0–36.0)
MCV: 88.5 fl (ref 78.0–100.0)
Monocytes Absolute: 0.8 10*3/uL (ref 0.1–1.0)
Monocytes Relative: 9.7 % (ref 3.0–12.0)
Neutro Abs: 4.9 10*3/uL (ref 1.4–7.7)
Neutrophils Relative %: 56.4 % (ref 43.0–77.0)
Platelets: 226 10*3/uL (ref 150.0–400.0)
RBC: 4.54 Mil/uL (ref 4.22–5.81)
RDW: 14.1 % (ref 11.5–15.5)
WBC: 8.7 10*3/uL (ref 4.0–10.5)

## 2021-12-16 LAB — TSH: TSH: 1.93 u[IU]/mL (ref 0.35–5.50)

## 2021-12-16 MED ORDER — TRELEGY ELLIPTA 100-62.5-25 MCG/ACT IN AEPB: 1.0000 | INHALATION_SPRAY | Freq: Every day | RESPIRATORY_TRACT | 1 refills | Status: DC

## 2021-12-16 NOTE — Patient Instructions (Signed)
Hypertension, Adult High blood pressure (hypertension) is when the force of blood pumping through the arteries is too strong. The arteries are the blood vessels that carry blood from the heart throughout the body. Hypertension forces the heart to work harder to pump blood and may cause arteries to become narrow or stiff. Untreated or uncontrolled hypertension can lead to a heart attack, heart failure, a stroke, kidney disease, and other problems. A blood pressure reading consists of a higher number over a lower number. Ideally, your blood pressure should be below 120/80. The first ("top") number is called the systolic pressure. It is a measure of the pressure in your arteries as your heart beats. The second ("bottom") number is called the diastolic pressure. It is a measure of the pressure in your arteries as the heart relaxes. What are the causes? The exact cause of this condition is not known. There are some conditions that result in high blood pressure. What increases the risk? Certain factors may make you more likely to develop high blood pressure. Some of these risk factors are under your control, including: Smoking. Not getting enough exercise or physical activity. Being overweight. Having too much fat, sugar, calories, or salt (sodium) in your diet. Drinking too much alcohol. Other risk factors include: Having a personal history of heart disease, diabetes, high cholesterol, or kidney disease. Stress. Having a family history of high blood pressure and high cholesterol. Having obstructive sleep apnea. Age. The risk increases with age. What are the signs or symptoms? High blood pressure may not cause symptoms. Very high blood pressure (hypertensive crisis) may cause: Headache. Fast or irregular heartbeats (palpitations). Shortness of breath. Nosebleed. Nausea and vomiting. Vision changes. Severe chest pain, dizziness, and seizures. How is this diagnosed? This condition is diagnosed by  measuring your blood pressure while you are seated, with your arm resting on a flat surface, your legs uncrossed, and your feet flat on the floor. The cuff of the blood pressure monitor will be placed directly against the skin of your upper arm at the level of your heart. Blood pressure should be measured at least twice using the same arm. Certain conditions can cause a difference in blood pressure between your right and left arms. If you have a high blood pressure reading during one visit or you have normal blood pressure with other risk factors, you may be asked to: Return on a different day to have your blood pressure checked again. Monitor your blood pressure at home for 1 week or longer. If you are diagnosed with hypertension, you may have other blood or imaging tests to help your health care provider understand your overall risk for other conditions. How is this treated? This condition is treated by making healthy lifestyle changes, such as eating healthy foods, exercising more, and reducing your alcohol intake. You may be referred for counseling on a healthy diet and physical activity. Your health care provider may prescribe medicine if lifestyle changes are not enough to get your blood pressure under control and if: Your systolic blood pressure is above 130. Your diastolic blood pressure is above 80. Your personal target blood pressure may vary depending on your medical conditions, your age, and other factors. Follow these instructions at home: Eating and drinking  Eat a diet that is high in fiber and potassium, and low in sodium, added sugar, and fat. An example of this eating plan is called the DASH diet. DASH stands for Dietary Approaches to Stop Hypertension. To eat this way: Eat   plenty of fresh fruits and vegetables. Try to fill one half of your plate at each meal with fruits and vegetables. Eat whole grains, such as whole-wheat pasta, brown rice, or whole-grain bread. Fill about one  fourth of your plate with whole grains. Eat or drink low-fat dairy products, such as skim milk or low-fat yogurt. Avoid fatty cuts of meat, processed or cured meats, and poultry with skin. Fill about one fourth of your plate with lean proteins, such as fish, chicken without skin, beans, eggs, or tofu. Avoid pre-made and processed foods. These tend to be higher in sodium, added sugar, and fat. Reduce your daily sodium intake. Many people with hypertension should eat less than 1,500 mg of sodium a day. Do not drink alcohol if: Your health care provider tells you not to drink. You are pregnant, may be pregnant, or are planning to become pregnant. If you drink alcohol: Limit how much you have to: 0-1 drink a day for women. 0-2 drinks a day for men. Know how much alcohol is in your drink. In the [place] one drink equals one 12 oz bottle of beer (355 mL), one 5 oz glass of wine (148 mL), or one 1 oz glass of hard liquor (44 mL). Lifestyle  Work with your health care provider to maintain a healthy body weight or to lose weight. Ask what an ideal weight is for you. Get at least 30 minutes of exercise that causes your heart to beat faster (aerobic exercise) most days of the week. Activities may include walking, swimming, or biking. Include exercise to strengthen your muscles (resistance exercise), such as Pilates or lifting weights, as part of your weekly exercise routine. Try to do these types of exercises for 30 minutes at least 3 days a week. Do not use any products that contain nicotine or tobacco. These products include cigarettes, chewing tobacco, and vaping devices, such as e-cigarettes. If you need help quitting, ask your health care provider. Monitor your blood pressure at home as told by your health care provider. Keep all follow-up visits. This is important. Medicines Take over-the-counter and prescription medicines only as told by your health care provider. Follow directions carefully. Blood  pressure medicines must be taken as prescribed. Do not skip doses of blood pressure medicine. Doing this puts you at risk for problems and can make the medicine less effective. Ask your health care provider about side effects or reactions to medicines that you should watch for. Contact a health care provider if you: Think you are having a reaction to a medicine you are taking. Have headaches that keep coming back (recurring). Feel dizzy. Have swelling in your ankles. Have trouble with your vision. Get help right away if you: Develop a severe headache or confusion. Have unusual weakness or numbness. Feel faint. Have severe pain in your chest or abdomen. Vomit repeatedly. Have trouble breathing. These symptoms may be an emergency. Get help right away. Call 911. Do not wait to see if the symptoms will go away. Do not drive yourself to the hospital. Summary Hypertension is when the force of blood pumping through your arteries is too strong. If this condition is not controlled, it may put you at risk for serious complications. Your personal target blood pressure may vary depending on your medical conditions, your age, and other factors. For most people, a normal blood pressure is less than 120/80. Hypertension is treated with lifestyle changes, medicines, or a combination of both. Lifestyle changes include losing weight, eating a healthy,   low-sodium diet, exercising more, and limiting alcohol. This information is not intended to replace advice given to you by your health care provider. Make sure you discuss any questions you have with your health care provider. Document Revised: 03/26/2021 Document Reviewed: 03/26/2021 Elsevier Patient Education  2023 [employer]  

## 2021-12-16 NOTE — Progress Notes (Addendum)
Subjective:  Patient ID: [name], male    DOB: 04-04-1966  Age: 56 y.o. MRN: [id number]  CC: Asthma, Hypertension, and Hyperlipidemia   HPI [name] presents for f/up -  [name] recently ran out of Trelegy and developed a nonproductive cough with wheezing and shortness of breath.  [name] denies chest pain, hemoptysis, diaphoresis, or edema.  A month ago [name] had some discomfort in his right scrotum but that has resolved.  [name] denies dysuria or hematuria.  [name] is not taking carvedilol.  Outpatient Medications Prior to Visit  Medication Sig Dispense Refill   albuterol (VENTOLIN HFA) 108 (90 Base) MCG/ACT inhaler Inhale 1-2 puffs into the lungs every 6 (six) hours as needed for wheezing or shortness of breath. 25.5 g 3   Cholecalciferol 50 MCG (2000 UT) TABS Take 1 tablet (2,000 Units total) by mouth daily. 90 tablet 1   KLOR-CON M20 20 MEQ tablet TAKE 1 TABLET BY MOUTH TWICE A DAY 180 tablet 1   Multiple Vitamin (MULTIVITAMIN WITH MINERALS) TABS tablet Take 1 tablet by mouth daily.     nitroGLYCERIN (NITROSTAT) 0.3 MG SL tablet Place 0.3 mg under the tongue 2 (two) times daily as needed.     pantoprazole (PROTONIX) 40 MG tablet TAKE 1 TABLET BY MOUTH EVERY DAY 90 tablet 1   rivaroxaban (XARELTO) 10 MG TABS tablet Take 1 tablet (10 mg total) by mouth daily. 90 tablet 1   rosuvastatin (CRESTOR) 10 MG tablet Take 1 tablet (10 mg total) by mouth daily. 90 tablet 1   sildenafil (REVATIO) 20 MG tablet Take 3 tablets (60 mg total) by mouth daily as needed. 60 tablet 5   traMADol (ULTRAM) 50 MG tablet Take 1 tablet (50 mg total) by mouth every 6 (six) hours as needed. 90 tablet 2   Vitamin D, Ergocalciferol, (DRISDOL) 1.25 MG (50000 UNIT) CAPS capsule TAKE 1 CAPSULE (50,000 UNITS TOTAL) BY MOUTH EVERY 7 (SEVEN) DAYS. 12 capsule 0   Armodafinil 250 MG tablet Take 1 tablet (250 mg total) by mouth daily. 90 tablet 1   carvedilol (COREG) 3.125 MG tablet Take 1 tablet (3.125 mg total) by mouth 2  (two) times daily with a meal. 180 tablet 1   TRELEGY ELLIPTA 100-62.5-25 MCG/INH AEPB Inhale 1 puff into the lungs daily. 3 each 1   No facility-administered medications prior to visit.    ROS Review of Systems  Constitutional: Negative.  Negative for diaphoresis and fatigue.  HENT: Negative.    Eyes: Negative.   Respiratory:  Positive for cough, shortness of breath and wheezing. Negative for chest tightness and stridor.   Cardiovascular:  Negative for chest pain, palpitations and leg swelling.  Gastrointestinal:  Negative for abdominal pain, constipation, diarrhea, nausea and vomiting.  Endocrine: Negative.   Genitourinary: Negative.  Negative for difficulty urinating and hematuria.  Musculoskeletal:  Positive for back pain.  Neurological:  Negative for dizziness, weakness and headaches.  Hematological:  Negative for adenopathy. Does not bruise/bleed easily.  Psychiatric/Behavioral: Negative.      Objective:  BP 138/88 (BP Location: Left Arm, Patient Position: Sitting, Cuff Size: Large)   Pulse 90   Temp 97.7 F (36.5 C) (Oral)   Ht '5\' 7"'$  (1.702 m)   Wt 264 lb (119.7 kg)   SpO2 93%   BMI 41.35 kg/m   BP Readings from Last 3 Encounters:  12/16/21 138/88  11/15/21 (!) 159/94  10/18/21 (!) 150/94    Wt Readings from Last 3 Encounters:  12/16/21  264 lb (119.7 kg)  10/18/21 262 lb 9.6 oz (119.1 kg)  09/09/21 265 lb (120.2 kg)    Physical Exam Vitals reviewed.  HENT:     Nose: Nose normal.     Mouth/Throat:     Mouth: Mucous membranes are moist.  Eyes:     General: No scleral icterus.    Conjunctiva/sclera: Conjunctivae normal.  Cardiovascular:     Rate and Rhythm: Normal rate and regular rhythm.     Heart sounds: No murmur heard. Pulmonary:     Effort: Pulmonary effort is normal. No respiratory distress.     Breath sounds: No stridor. No wheezing, rhonchi or rales.  Abdominal:     General: Abdomen is flat.     Palpations: There is no mass.     Tenderness:  There is no abdominal tenderness. There is no guarding.     Hernia: No hernia is present. There is no hernia in the left inguinal area or right inguinal area.  Genitourinary:    Pubic Area: No rash.      Penis: Normal and circumcised.      Testes: Normal.     Epididymis:     Right: Normal.     Left: Normal.  Musculoskeletal:        General: Normal range of motion.     Cervical back: Neck supple.     Right lower leg: No edema.     Left lower leg: No edema.  Lymphadenopathy:     Cervical: No cervical adenopathy.     Lower Body: No right inguinal adenopathy. No left inguinal adenopathy.  Skin:    General: Skin is warm and dry.  Neurological:     General: No focal deficit present.     Mental Status: [name] is alert.  Psychiatric:        Mood and Affect: Mood normal.        Behavior: Behavior normal.     Lab Results  Component Value Date   WBC 8.7 12/16/2021   HGB 13.4 12/16/2021   HCT 40.2 12/16/2021   PLT 226.0 12/16/2021   GLUCOSE 87 12/16/2021   CHOL 155 12/16/2021   TRIG 138.0 12/16/2021   HDL 48.60 12/16/2021   LDLCALC 79 12/16/2021   ALT 18 12/16/2021   AST 18 12/16/2021   NA 140 12/16/2021   K 4.0 12/16/2021   CL 105 12/16/2021   CREATININE 0.89 12/16/2021   BUN 15 12/16/2021   CO2 26 12/16/2021   TSH 1.93 12/16/2021   PSA 0.27 02/14/2021   INR CANCELED 08/12/2013   HGBA1C 5.7 (H) 12/22/2019    DG FACET JT INJ L /S SINGLE LEVEL RIGHT W/FL/CT  Result Date: 11/15/2021 CLINICAL DATA:  facet joint pain EXAM: Bilateral L4-L5 facet steroid injection using fluoroscopic guidance TECHNIQUE: Overlying skin prepped with Betadine, draped in the usual sterile fashion, and infiltrated locally with buffered Lidocaine. A 22 gauge spinal needle advanced to the posterior aspect of the left L4-L5 facet. 40 mg Depo-Medrol and 0.5 mL 1% lidocaine was then administered. No immediate complication. Overlying skin prepped with Betadine, draped in the usual sterile fashion, and  infiltrated locally with buffered Lidocaine. A 22 gauge spinal needle advanced to the posterior aspect of the right L4-L5 facet. 40 mg Depo-Medrol and 0.5 mL 1% lidocaine was then administered. No immediate complication. FLUOROSCOPY: Radiation Exposure Index (as provided by the fluoroscopic device): 1.5 minutes (15 mGy) IMPRESSION: Successful bilateral L4-L5 facet steroid injection. Electronically Signed   By: Murrell Redden  El-Abd M.D.   On: 11/15/2021 10:03   DG FACET JT INJ L /S SINGLE LEVEL LEFT W/FL/CT  Result Date: 11/15/2021 CLINICAL DATA:  facet joint pain EXAM: Bilateral L4-L5 facet steroid injection using fluoroscopic guidance TECHNIQUE: Overlying skin prepped with Betadine, draped in the usual sterile fashion, and infiltrated locally with buffered Lidocaine. A 22 gauge spinal needle advanced to the posterior aspect of the left L4-L5 facet. 40 mg Depo-Medrol and 0.5 mL 1% lidocaine was then administered. No immediate complication. Overlying skin prepped with Betadine, draped in the usual sterile fashion, and infiltrated locally with buffered Lidocaine. A 22 gauge spinal needle advanced to the posterior aspect of the right L4-L5 facet. 40 mg Depo-Medrol and 0.5 mL 1% lidocaine was then administered. No immediate complication. FLUOROSCOPY: Radiation Exposure Index (as provided by the fluoroscopic device): 1.5 minutes (15 mGy) IMPRESSION: Successful bilateral L4-L5 facet steroid injection. Electronically Signed   By: Albin Felling M.D.   On: 11/15/2021 10:03    Assessment & Plan:   [name] was seen today for asthma, hypertension and hyperlipidemia.  Diagnoses and all orders for this visit:  Mild intermittent asthma without complication -     CBC with Differential/Platelet; Future -     Fluticasone-Umeclidin-Vilant (TRELEGY ELLIPTA) 100-62.5-25 MCG/ACT AEPB; Inhale 1 puff into the lungs daily. -     CBC with Differential/Platelet  Essential hypertension- His blood pressure is adequately well  controlled. -     Basic metabolic panel; Future -     Hepatic function panel; Future -     TSH; Future -     Urinalysis, Routine w reflex microscopic; Future -     Basic metabolic panel -     Hepatic function panel -     TSH -     Urinalysis, Routine w reflex microscopic  Dyslipidemia, goal LDL below 100- LDL goal achieved. Doing well on the statin  -     Lipid panel; Future -     Hepatic function panel; Future -     TSH; Future -     Lipid panel -     Hepatic function panel -     TSH  Morbid obesity (HCC) -     TSH; Future -     TSH  Other orders -     Varicella-zoster vaccine IM (Shingrix)   I have discontinued [name] Armodafinil, Trelegy Ellipta, and carvedilol. I am also having him start on Trelegy Ellipta. Additionally, I am having him maintain his multivitamin with minerals, Cholecalciferol, Vitamin D (Ergocalciferol), albuterol, nitroGLYCERIN, sildenafil, Klor-Con M20, pantoprazole, rosuvastatin, rivaroxaban, and traMADol.  Meds ordered this encounter  Medications   Fluticasone-Umeclidin-Vilant (TRELEGY ELLIPTA) 100-62.5-25 MCG/ACT AEPB    Sig: Inhale 1 puff into the lungs daily.    Dispense:  120 each    Refill:  1     Follow-up: Return in about 3 months (around 03/18/2022).  Scarlette Calico, MD

## 2021-12-17 LAB — BASIC METABOLIC PANEL
BUN: 15 mg/dL (ref 6–23)
CO2: 26 mEq/L (ref 19–32)
Calcium: 8.9 mg/dL (ref 8.4–10.5)
Chloride: 105 mEq/L (ref 96–112)
Creatinine, Ser: 0.89 mg/dL (ref 0.40–1.50)
GFR: 96.01 mL/min (ref >60.00)
Glucose, Bld: 87 mg/dL (ref 70–99)
Potassium: 4 mEq/L (ref 3.5–5.1)
Sodium: 140 mEq/L (ref 135–145)

## 2021-12-17 LAB — URINALYSIS, ROUTINE W REFLEX MICROSCOPIC
Bilirubin Urine: NEGATIVE
Hgb urine dipstick: NEGATIVE
Ketones, ur: NEGATIVE
Leukocytes,Ua: NEGATIVE
Nitrite: NEGATIVE
RBC / HPF: NONE SEEN (ref 0–?)
Specific Gravity, Urine: 1.025 (ref 1.000–1.030)
Total Protein, Urine: NEGATIVE
Urine Glucose: NEGATIVE
Urobilinogen, UA: 1 (ref 0.0–1.0)
WBC, UA: NONE SEEN (ref 0–?)
pH: 6 (ref 5.0–8.0)

## 2021-12-17 LAB — HEPATIC FUNCTION PANEL
ALT: 18 U/L (ref 0–53)
AST: 18 U/L (ref 0–37)
Albumin: 4.1 g/dL (ref 3.5–5.2)
Alkaline Phosphatase: 57 U/L (ref 39–117)
Bilirubin, Direct: 0.1 mg/dL (ref 0.0–0.3)
Total Bilirubin: 0.3 mg/dL (ref 0.2–1.2)
Total Protein: 7.3 g/dL (ref 6.0–8.3)

## 2021-12-17 LAB — LIPID PANEL
Cholesterol: 155 mg/dL (ref 0–200)
HDL: 48.6 mg/dL (ref >39.00)
LDL Cholesterol: 79 mg/dL (ref 0–99)
NonHDL: 106.56
Total CHOL/HDL Ratio: 3
Triglycerides: 138 mg/dL (ref 0.0–149.0)
VLDL: 27.6 mg/dL (ref 0.0–40.0)

## 2021-12-30 ENCOUNTER — Telehealth: Payer: Self-pay | Admitting: Internal Medicine

## 2021-12-31 ENCOUNTER — Ambulatory Visit (INDEPENDENT_AMBULATORY_CARE_PROVIDER_SITE_OTHER): Payer: No Typology Code available for payment source | Admitting: Pulmonary Disease

## 2021-12-31 ENCOUNTER — Encounter: Payer: Self-pay | Admitting: Pulmonary Disease

## 2021-12-31 VITALS — BP 140/86 | HR 80 | Ht 67.0 in | Wt 270.6 lb

## 2021-12-31 DIAGNOSIS — G4733 Obstructive sleep apnea (adult) (pediatric): Secondary | ICD-10-CM

## 2021-12-31 DIAGNOSIS — J4520 Mild intermittent asthma, uncomplicated: Secondary | ICD-10-CM

## 2021-12-31 MED ORDER — AZELASTINE HCL 0.1 % NA SOLN: 1.0000 | Freq: Every day | NASAL | 0 refills | Status: DC

## 2021-12-31 NOTE — Assessment & Plan Note (Signed)
He can continue albuterol MDI as needed.  I doubt Trelegy is required here. For allergic rhinitis, we will give him azelastine nasal spray and have asked him to use OTC nasal decongestant as needed

## 2021-12-31 NOTE — Progress Notes (Signed)
   Subjective:    Patient ID: [name], male    DOB: 12-01-1965, 56 y.o.   MRN: [id number]  HPI  56 year old for follow-up of moderate OSA  -on CPAP with nasal pillows. He also has a chronic cough attributed to GERD H/o saddle PE 2013  Last office visit was in 12/2019 He is now off Xarelto.  He reports that his nostrils remain blocked, he is using Vicks inhaler. He does not feel fully rested and does not have as much energy and wonders if his CPAP pressure needs to be adjusted.  He feels that he is not getting enough air.  Wife has not witnessed apneas or snoring while on the machine I note that he was started on Trelegy by his PCP -he does not feel that this is helping him much  Significant tests/ events reviewed HST 09/2013: 24/hr PFTs 01/2017 normal 03/2018 , 04/2019, 08/2020 CT angiogram normal   Review of Systems neg for any significant sore throat, dysphagia, itching, sneezing, nasal congestion or excess/ purulent secretions, fever, chills, sweats, unintended wt loss, pleuritic or exertional cp, hempoptysis, orthopnea pnd or change in chronic leg swelling. Also denies presyncope, palpitations, heartburn, abdominal pain, nausea, vomiting, diarrhea or change in bowel or urinary habits, dysuria,hematuria, rash, arthralgias, visual complaints, headache, numbness weakness or ataxia.     Objective:   Physical Exam  Gen. Pleasant, obese, in no distress ENT - no lesions, no post nasal drip Neck: No JVD, no thyromegaly, no carotid bruits Lungs: no use of accessory muscles, no dullness to percussion, decreased without rales or rhonchi  Cardiovascular: Rhythm regular, heart sounds  normal, no murmurs or gallops, no peripheral edema Musculoskeletal: No deformities, no cyanosis or clubbing , no tremors       Assessment & Plan:

## 2021-12-31 NOTE — Patient Instructions (Signed)
X change to auto CPAP 12 to 15 cm Trial of AirFit F30 fullface mask  X Rx for azelastine nasal spray, 1 spray each nare at bedtime Okay to use Afrin nasal spray OTC as needed for decongestant

## 2021-12-31 NOTE — Assessment & Plan Note (Signed)
CPAP download was reviewed which showed good control of events on 12 cm with large leak and excellent compliance more than 8 hours on average. He is very compliant and CPAP is only helped improve his daytime somnolence and fatigue.  We will increase to auto settings 12 to 15 cm  Weight loss encouraged, compliance with goal of at least 4-6 hrs every night is the expectation. Advised against medications with sedative side effects Cautioned against driving when sleepy - understanding that sleepiness will vary on a day to day basis

## 2022-01-01 ENCOUNTER — Ambulatory Visit
Admission: EM | Admit: 2022-01-01 | Discharge: 2022-01-01 | Disposition: A | Discharge status: Home / Self Care | Payer: No Typology Code available for payment source | Attending: Physician Assistant | Admitting: Physician Assistant

## 2022-01-01 ENCOUNTER — Telehealth: Payer: Self-pay | Admitting: Internal Medicine

## 2022-01-01 DIAGNOSIS — N50811 Right testicular pain: Secondary | ICD-10-CM | POA: Insufficient documentation

## 2022-01-01 DIAGNOSIS — R109 Unspecified abdominal pain: Secondary | ICD-10-CM | POA: Diagnosis present

## 2022-01-01 LAB — POCT URINALYSIS DIP (MANUAL ENTRY)
Bilirubin, UA: NEGATIVE
Blood, UA: NEGATIVE
Glucose, UA: NEGATIVE mg/dL
Nitrite, UA: NEGATIVE
Protein Ur, POC: NEGATIVE mg/dL
Spec Grav, UA: >=1.03 — AB (ref 1.010–1.025)
Urobilinogen, UA: 0.2 E.U./dL
pH, UA: 6 (ref 5.0–8.0)

## 2022-01-01 MED ORDER — CYCLOBENZAPRINE HCL 10 MG PO TABS: 10.0000 mg | ORAL_TABLET | Freq: Two times a day (BID) | ORAL | 0 refills | Status: DC | PRN

## 2022-01-01 MED ORDER — TAMSULOSIN HCL 0.4 MG PO CAPS: 0.4000 mg | ORAL_CAPSULE | Freq: Every day | ORAL | 0 refills | Status: DC

## 2022-01-01 NOTE — ED Triage Notes (Signed)
The pt c/o right testicular pain, and right sided groin pain that radiates to his right side.  Started: a month ago  Home interventions: advil

## 2022-01-01 NOTE — Telephone Encounter (Signed)
Patient would like something called in for the pain in right testicle and would like something called in - I told patient he would need to schedule an appointment and he got very upset and said just to send the message.

## 2022-01-01 NOTE — ED Provider Notes (Signed)
[facility]    CSN: [id number] Arrival date & time: 01/01/22  1711      History   Chief Complaint Chief Complaint  Patient presents with   Testicle Pain    HPI [name] is a 56 y.o. male.   Patient here today for evaluation of right flank pain that radiates into right testicle has been ongoing for the last 24 hours.  He reports that he had some symptoms over the last month but nothing as significant as today.  He has taken Advil and typically this would help but it did not help today.  He reports at work he had trouble lifting due to pain.  He denies any known injury.  He states that his pain feels very similar to prior kidney stones.  He has not had any hematuria.  He denies dysuria.  He denies any abdominal pain, nausea or vomiting.  He denies any bulges or rashes in his groin area.  He denies any concerns for STDs.  The history is provided by the patient.    Past Medical History:  Diagnosis Date   Anxiety    Arthritis    knees - no med   Cluster headache    Hx - resolved per patient   Depression    DVT of lower extremity (deep venous thrombosis) ([place]) 6/26-28/2013   Xarelto - resolved - history   HTN (hypertension)    currently no meds   Irritability and anger    PMH of   Kidney stone    passed stone, no surgery required.   Mild intermittent asthma without complication 06/20/4172   Rotator cuff arthropathy 2007   right   Saddle pulmonary embolus ([place]) 6/26-28/2013   post orthopedic surgery - resolved   Sleep apnea 5/15   moderate OSA-has cpap but does not use it    Patient Active Problem List   Diagnosis Date Noted   Varicose veins of right lower extremity with inflammation 08/17/2021   Dyslipidemia, goal LDL below 100 08/17/2021   Encounter for general adult medical examination with abnormal findings 02/14/2021   Chronic anticoagulation 01/22/2021   Mild intermittent asthma without complication 01/13/4817   Polyp of colon 11/13/2020    Hypogonadism male 02/10/2020   Erectile dysfunction due to arterial insufficiency 02/08/2020   Intrinsic eczema 02/08/2020   H. pylori infection 01/25/2020   Excessive somnolence disorder 05/09/2019   AC (acromioclavicular) arthritis 05/03/2019   Subacromial bursitis of right shoulder joint 05/03/2019   Ejection fraction < 50% 02/03/2019   Vitamin D deficiency disease 11/03/2018   PUD (peptic ulcer disease) 01/20/2018   Renal stone 08/26/2017   Morbid obesity ([place]) 08/25/2017   GERD with esophagitis 08/24/2017   Degenerative arthritis of knee, bilateral 06/08/2017   Venous insufficiency of both lower extremities 10/05/2015   Heterozygous factor V Leiden mutation ([place]) 08/24/2013   OSA (obstructive sleep apnea) 08/13/2013   Incomplete right bundle branch block 06/18/2011   Essential hypertension 07/06/2008   ERECTILE DYSFUNCTION, ORGANIC 01/28/2007    Past Surgical History:  Procedure Laterality Date   arthroscopic knee surgery  10/09/2011   Dr Ninfa Linden   CARDIAC CATHETERIZATION  06/02/2005   LVdysfunction; normal coronaries   COLONOSCOPY     KNEE ARTHROSCOPY WITH MEDIAL MENISECTOMY Left 09/20/2014   medial menisectomy chondroplastytella medial plica excision  ;  Surgeon: Dorna Leitz, MD;  Location: [place];  Service: Orthopedics;  Laterality: Left;   ROTATOR CUFF REPAIR  06/02/2005   right  UPPER GASTROINTESTINAL ENDOSCOPY  06/02/2005   normal   WISDOM TOOTH EXTRACTION         Home Medications    Prior to Admission medications   Medication Sig Start Date End Date Taking? Authorizing Provider  cyclobenzaprine (FLEXERIL) 10 MG tablet Take 1 tablet (10 mg total) by mouth 2 (two) times daily as needed for muscle spasms. 01/01/22  Yes Francene Finders, PA-C  tamsulosin (FLOMAX) 0.4 MG CAPS capsule Take 1 capsule (0.4 mg total) by mouth daily. 01/01/22  Yes Francene Finders, PA-C  albuterol (VENTOLIN HFA) 108 (90 Base) MCG/ACT inhaler Inhale 1-2 puffs into the  lungs every 6 (six) hours as needed for wheezing or shortness of breath. Patient not taking: Reported on 12/31/2021 11/13/20   [name], MD  azelastine (ASTELIN) 0.1 % nasal spray Place 1 spray into both nostrils at bedtime. Use in each nostril as directed 12/31/21   Rigoberto Noel, MD  Cholecalciferol 50 MCG (2000 UT) TABS Take 1 tablet (2,000 Units total) by mouth daily. Patient not taking: Reported on 12/31/2021 09/08/19   [name], MD  Fluticasone-Umeclidin-Vilant (TRELEGY ELLIPTA) 100-62.5-25 MCG/ACT AEPB Inhale 1 puff into the lungs daily. 12/16/21   [name], MD  KLOR-CON M20 20 MEQ tablet TAKE 1 TABLET BY MOUTH TWICE A DAY Patient not taking: Reported on 12/31/2021 05/12/21   [name], MD  Multiple Vitamin (MULTIVITAMIN WITH MINERALS) TABS tablet Take 1 tablet by mouth daily. Patient not taking: Reported on 12/31/2021    [provider]  nitroGLYCERIN (NITROSTAT) 0.3 MG SL tablet Place 0.3 mg under the tongue 2 (two) times daily as needed. 09/04/20   [provider]  pantoprazole (PROTONIX) 40 MG tablet TAKE 1 TABLET BY MOUTH EVERY DAY Patient not taking: Reported on 12/31/2021 07/29/21   Zehr, Laban Emperor, PA-C  rivaroxaban (XARELTO) 10 MG TABS tablet Take 1 tablet (10 mg total) by mouth daily. Patient not taking: Reported on 12/31/2021 08/14/21   [name], MD  rosuvastatin (CRESTOR) 10 MG tablet Take 1 tablet (10 mg total) by mouth daily. Patient not taking: Reported on 12/31/2021 08/14/21   [name], MD  sildenafil (REVATIO) 20 MG tablet Take 3 tablets (60 mg total) by mouth daily as needed. 03/14/21   [name], MD  traMADol (ULTRAM) 50 MG tablet Take 1 tablet (50 mg total) by mouth every 6 (six) hours as needed. 09/07/21   [name], MD  Vitamin D, Ergocalciferol, (DRISDOL) 1.25 MG (50000 UNIT) CAPS capsule TAKE 1 CAPSULE (50,000 UNITS TOTAL) BY MOUTH EVERY 7 (SEVEN) DAYS. Patient not taking: Reported on 12/31/2021 09/26/19   Lyndal Pulley,  DO    Family History Family History  Problem Relation Age of Onset   Cancer Mother        Mesothelioma    Diabetes Brother    Hypertension Brother    Stroke Brother 11   Heart attack Brother 22   Diabetes Maternal Aunt    Diabetes Maternal Grandmother    Heart disease Maternal Grandmother    Hypertension Maternal Grandmother    Stroke Maternal Grandmother        in 36s   Deep vein thrombosis Maternal Grandmother    Stroke Maternal Grandfather        > 79   Migraines Brother    Heart attack Brother 60   Healthy Father    Colon cancer Neg Hx    Esophageal cancer Neg Hx  Rectal cancer Neg Hx    Stomach cancer Neg Hx    Liver cancer Neg Hx    Pancreatic cancer Neg Hx    Other Neg Hx        low testosterone    Social History Social History   Tobacco Use   Smoking status: Some Days    Types: Cigars    Passive exposure: Never   Smokeless tobacco: Never   Tobacco comments:    every 3-4 months  Vaping Use   Vaping Use: Never used  Substance Use Topics   Alcohol use: Yes    Alcohol/week: 0.0 standard drinks of alcohol    Comment: drinks occasionally   Drug use: No     Allergies   Patient has no known allergies.   Review of Systems Review of Systems  Constitutional:  Negative for chills and fever.  Eyes:  Negative for discharge and redness.  Respiratory:  Negative for shortness of breath.   Gastrointestinal:  Negative for abdominal pain, nausea and vomiting.  Genitourinary:  Positive for flank pain and testicular pain. Negative for dysuria and hematuria.  Musculoskeletal:  Positive for back pain.     Physical Exam Triage Vital Signs ED Triage Vitals  Enc Vitals Group     BP      Pulse      Resp      Temp      Temp src      SpO2      Weight      Height      Head Circumference      Peak Flow      Pain Score      Pain Loc      Pain Edu?      Excl. in [place]?    No data found.  Updated Vital Signs BP (!) 162/61 (BP Location: Left Arm)   Pulse  69   Temp 98 F (36.7 C) (Oral)   Resp 18   SpO2 95%      Physical Exam Vitals and nursing note reviewed.  Constitutional:      General: He is not in acute distress.    Appearance: Normal appearance. He is not ill-appearing.  HENT:     Head: Normocephalic and atraumatic.  Eyes:     Conjunctiva/sclera: Conjunctivae normal.  Cardiovascular:     Rate and Rhythm: Normal rate.  Pulmonary:     Effort: Pulmonary effort is normal.  Musculoskeletal:     Comments: No midline spine tenderness to thoracic or lumbar spine, no TTP across low back  Neurological:     Mental Status: He is alert.  Psychiatric:        Mood and Affect: Mood normal.        Behavior: Behavior normal.        Thought Content: Thought content normal.      UC Treatments / Results  Labs (all labs ordered are listed, but only abnormal results are displayed) Labs Reviewed  POCT URINALYSIS DIP (MANUAL ENTRY) - Abnormal; Notable for the following components:      Result Value   Clarity, UA cloudy (*)    Ketones, POC UA trace (5) (*)    Spec Grav, UA >=1.030 (*)    Leukocytes, UA Trace (*)    All other components within normal limits  URINE CULTURE    EKG   Radiology No results found.  Procedures Procedures (including critical care time)  Medications Ordered in UC Medications - No data  to display  Initial Impression / Assessment and Plan / UC Course  I have reviewed the triage vital signs and the nursing notes.  Pertinent labs & imaging results that were available during my care of the patient were reviewed by me and considered in my medical decision making (see chart for details).    We will prescribe Flomax in the event symptoms are being caused by kidney stone.  Muscle relaxer also prescribed given pain with lifting.  Recommended further evaluation in the emergency department if symptoms worsen in any way.  Patient expresses understanding.  Final Clinical Impressions(s) / UC Diagnoses   Final  diagnoses:  Pain in right testicle  Acute right flank pain   Discharge Instructions   None    ED Prescriptions     Medication Sig Dispense Auth. Provider   cyclobenzaprine (FLEXERIL) 10 MG tablet Take 1 tablet (10 mg total) by mouth 2 (two) times daily as needed for muscle spasms. 20 tablet Ewell Poe F, PA-C   tamsulosin (FLOMAX) 0.4 MG CAPS capsule Take 1 capsule (0.4 mg total) by mouth daily. 30 capsule Francene Finders, PA-C      PDMP not reviewed this encounter.   Francene Finders, PA-C 01/01/22 Vernelle Emerald

## 2022-01-02 ENCOUNTER — Ambulatory Visit: Payer: No Typology Code available for payment source | Admitting: Internal Medicine

## 2022-01-02 LAB — URINE CULTURE: Culture: <10000 — AB

## 2022-01-14 NOTE — Progress Notes (Unsigned)
[facility] Phone: [phone] Subjective:   [name], am serving as a scribe for Dr. Hulan Saas.  I'm seeing this patient by the request  of:  [name], MD  CC: knee pain   [id number]  07/03/2021 Known facet arthropathy and edema noted at the L4-L5.  Patient is not a candidate for radiofrequency ablation secondary to not responding to the medial branch block.  We discussed different medications or other types of injections which patient declined other than the facet injections again.  Patient will have these done and we will see how patient responds.  Patient is awaiting a weight loss and exercise and then he can potentially have knee replacement.  Follow-up with me for this problem as needed.  Updated 01/15/2022 [name] is a 56 y.o. male coming in with complaint of LBP and bilateral knee pain. Pain in the anterior aspect of B knees that has been constant over past month. Using Advil daily.       Past Medical History:  Diagnosis Date   Anxiety    Arthritis    knees - [name] med   Cluster headache    Hx - resolved per patient   Depression    DVT of lower extremity (deep venous thrombosis) ([place]) 6/26-28/2013   Xarelto - resolved - history   HTN (hypertension)    currently [name] meds   Irritability and anger    PMH of   Kidney stone    passed stone, [name] surgery required.   Mild intermittent asthma without complication 11/20/3084   Rotator cuff arthropathy 2007   right   Saddle pulmonary embolus ([place]) 6/26-28/2013   post orthopedic surgery - resolved   Sleep apnea 5/15   moderate OSA-has cpap but does not use it   Past Surgical History:  Procedure Laterality Date   arthroscopic knee surgery  10/09/2011   Dr Ninfa Linden   CARDIAC CATHETERIZATION  06/02/2005   LVdysfunction; normal coronaries   COLONOSCOPY     KNEE ARTHROSCOPY WITH MEDIAL MENISECTOMY Left 09/20/2014   medial  menisectomy chondroplastytella medial plica excision  ;  Surgeon: Dorna Leitz, MD;  Location: [facility];  Service: Orthopedics;  Laterality: Left;   ROTATOR CUFF REPAIR  06/02/2005   right   UPPER GASTROINTESTINAL ENDOSCOPY  06/02/2005   normal   WISDOM TOOTH EXTRACTION     Social History   Socioeconomic History   Marital status: Married    Spouse name: Not on file   Number of children: 6   Years of education: 11   Highest education level: Not on file  Occupational History   Occupation: Glass blower/designer    Occupation: Programmer, systems  Tobacco Use   Smoking status: Some Days    Types: Cigars    Passive exposure: Never   Smokeless tobacco: Never   Tobacco comments:    every 3-4 months  Vaping Use   Vaping Use: Never used  Substance and Sexual Activity   Alcohol use: Yes    Alcohol/week: 0.0 standard drinks of alcohol    Comment: drinks occasionally   Drug use: [name]   Sexual activity: Yes    Partners: Female    Birth control/protection: Condom  Other Topics Concern   Not on file  Social History Narrative   Fun: Exercise   Social Determinants of Health   Financial Resource Strain: Not on file  Food Insecurity: Not on file  Transportation Needs: Not on file  Physical Activity: Not on file  Stress: Not on file  Social Connections: Not on file   [name] Known Allergies Family History  Problem Relation Age of Onset   Cancer Mother        Mesothelioma    Diabetes Brother    Hypertension Brother    Stroke Brother 53   Heart attack Brother 22   Diabetes Maternal Aunt    Diabetes Maternal Grandmother    Heart disease Maternal Grandmother    Hypertension Maternal Grandmother    Stroke Maternal Grandmother        in 10s   Deep vein thrombosis Maternal Grandmother    Stroke Maternal Grandfather        > 46   Migraines Brother    Heart attack Brother 10   Healthy Father    Colon cancer Neg Hx    Esophageal cancer Neg Hx    Rectal cancer Neg Hx     Stomach cancer Neg Hx    Liver cancer Neg Hx    Pancreatic cancer Neg Hx    Other Neg Hx        low testosterone     Current Outpatient Medications (Cardiovascular):    nitroGLYCERIN (NITROSTAT) 0.3 MG SL tablet, Place 0.3 mg under the tongue 2 (two) times daily as needed.   rosuvastatin (CRESTOR) 10 MG tablet, Take 1 tablet (10 mg total) by mouth daily.   sildenafil (REVATIO) 20 MG tablet, Take 3 tablets (60 mg total) by mouth daily as needed.  Current Outpatient Medications (Respiratory):    albuterol (VENTOLIN HFA) 108 (90 Base) MCG/ACT inhaler, Inhale 1-2 puffs into the lungs every 6 (six) hours as needed for wheezing or shortness of breath.   azelastine (ASTELIN) 0.1 % nasal spray, Place 1 spray into both nostrils at bedtime. Use in each nostril as directed   Fluticasone-Umeclidin-Vilant (TRELEGY ELLIPTA) 100-62.5-25 MCG/ACT AEPB, Inhale 1 puff into the lungs daily.  Current Outpatient Medications (Analgesics):    traMADol (ULTRAM) 50 MG tablet, Take 1 tablet (50 mg total) by mouth every 6 (six) hours as needed.  Current Outpatient Medications (Hematological):    rivaroxaban (XARELTO) 10 MG TABS tablet, Take 1 tablet (10 mg total) by mouth daily.  Current Outpatient Medications (Other):    Cholecalciferol 50 MCG (2000 UT) TABS, Take 1 tablet (2,000 Units total) by mouth daily.   cyclobenzaprine (FLEXERIL) 10 MG tablet, Take 1 tablet (10 mg total) by mouth 2 (two) times daily as needed for muscle spasms.   KLOR-CON M20 20 MEQ tablet, TAKE 1 TABLET BY MOUTH TWICE A DAY   levofloxacin (LEVAQUIN) 500 MG tablet, Take 1 tablet (500 mg total) by mouth daily.   Multiple Vitamin (MULTIVITAMIN WITH MINERALS) TABS tablet, Take 1 tablet by mouth daily.   pantoprazole (PROTONIX) 40 MG tablet, TAKE 1 TABLET BY MOUTH EVERY DAY   tamsulosin (FLOMAX) 0.4 MG CAPS capsule, Take 1 capsule (0.4 mg total) by mouth daily.   Vitamin D, Ergocalciferol, (DRISDOL) 1.25 MG (50000 UNIT) CAPS capsule, TAKE 1  CAPSULE (50,000 UNITS TOTAL) BY MOUTH EVERY 7 (SEVEN) DAYS.   Reviewed prior external information including notes and imaging from  primary care provider As well as notes that were available from care everywhere and other healthcare systems. ED visit right testicle in ED  Past medical history, social, surgical and family history all reviewed in electronic medical record.  [name] pertanent information unless stated regarding to the chief complaint.   Review  of Systems:  [name] headache, visual changes, nausea, vomiting, diarrhea, constipation, dizziness, abdominal pain, skin rash, fevers, chills, night sweats, weight loss, swollen lymph nodes, body aches,  chest pain, shortness of breath, mood changes. POSITIVE muscle aches, joint swelling  Objective  Blood pressure (!) 128/92, pulse 84, height '5\' 7"'$  (1.702 m), weight 271 lb (122.9 kg), SpO2 98 %.   General: [name] apparent distress alert and oriented x3 mood and affect normal, dressed appropriately.  HEENT: Pupils equal, extraocular movements intact  Respiratory: Patient's speak in full sentences and does not appear short of breath  Cardiovascular: [name] lower extremity edema, non tender, [name] erythema  Knee exam bilaterally does have trace effusion noted.  Instability noted with valgus and varus force.  Tender to palpation diffusely.  After informed written and verbal consent, patient was seated on exam table. Right knee was prepped with alcohol swab and utilizing anterolateral approach, patient's right knee space was injected with 4:1  marcaine 0.5%: Kenalog '40mg'$ /dL. Patient tolerated the procedure well without immediate complications.  After informed written and verbal consent, patient was seated on exam table. Left knee was prepped with alcohol swab and utilizing anterolateral approach, patient's left knee space was injected with 4:1  marcaine 0.5%: Kenalog '40mg'$ /dL. Patient tolerated the procedure well without immediate complications.    Impression and  Recommendations:    The above documentation has been reviewed and is accurate and complete Lyndal Pulley, DO

## 2022-01-15 ENCOUNTER — Ambulatory Visit (INDEPENDENT_AMBULATORY_CARE_PROVIDER_SITE_OTHER): Payer: No Typology Code available for payment source | Admitting: Family Medicine

## 2022-01-15 ENCOUNTER — Encounter: Payer: Self-pay | Admitting: Family Medicine

## 2022-01-15 DIAGNOSIS — M170 Bilateral primary osteoarthritis of knee: Secondary | ICD-10-CM

## 2022-01-15 MED ORDER — LEVOFLOXACIN 500 MG PO TABS: 500.0000 mg | ORAL_TABLET | Freq: Every day | ORAL | 0 refills | Status: DC

## 2022-01-15 NOTE — Assessment & Plan Note (Signed)
Injection given  worsenign pain, notice more pain with weight gain, discussed weight loss.  Icing  RTC in 3 months

## 2022-01-15 NOTE — Patient Instructions (Addendum)
Injection in both knees Levaquin once daily for 10 days Please  let us know if you develop diarrhea or seek medical attention See me again in 3 months

## 2022-01-22 NOTE — Progress Notes (Deleted)
HPI: Follow-up chest pain.  Cardiac catheterization 2007 showed normal coronary arteries with distal LAD intramyocardial bridge without obstruction.  Nuclear study June 2020 showed ejection fraction 51% with normal perfusion.  Echocardiogram September 2020 showed normal LV function, trace mitral regurgitation, mild tricuspid regurgitation.  Seen with chest pain April 2022.  Troponins normal.  Hemoglobin 14 and liver functions normal.  CTA April 2022 showed no pulmonary embolus.  Since last seen   Current Outpatient Medications  Medication Sig Dispense Refill   albuterol (VENTOLIN HFA) 108 (90 Base) MCG/ACT inhaler Inhale 1-2 puffs into the lungs every 6 (six) hours as needed for wheezing or shortness of breath. 25.5 g 3   azelastine (ASTELIN) 0.1 % nasal spray Place 1 spray into both nostrils at bedtime. Use in each nostril as directed 30 mL 0   Cholecalciferol 50 MCG (2000 UT) TABS Take 1 tablet (2,000 Units total) by mouth daily. 90 tablet 1   cyclobenzaprine (FLEXERIL) 10 MG tablet Take 1 tablet (10 mg total) by mouth 2 (two) times daily as needed for muscle spasms. 20 tablet 0   Fluticasone-Umeclidin-Vilant (TRELEGY ELLIPTA) 100-62.5-25 MCG/ACT AEPB Inhale 1 puff into the lungs daily. 120 each 1   KLOR-CON M20 20 MEQ tablet TAKE 1 TABLET BY MOUTH TWICE A DAY 180 tablet 1   levofloxacin (LEVAQUIN) 500 MG tablet Take 1 tablet (500 mg total) by mouth daily. 10 tablet 0   Multiple Vitamin (MULTIVITAMIN WITH MINERALS) TABS tablet Take 1 tablet by mouth daily.     nitroGLYCERIN (NITROSTAT) 0.3 MG SL tablet Place 0.3 mg under the tongue 2 (two) times daily as needed.     pantoprazole (PROTONIX) 40 MG tablet TAKE 1 TABLET BY MOUTH EVERY DAY 90 tablet 1   rivaroxaban (XARELTO) 10 MG TABS tablet Take 1 tablet (10 mg total) by mouth daily. 90 tablet 1   rosuvastatin (CRESTOR) 10 MG tablet Take 1 tablet (10 mg total) by mouth daily. 90 tablet 1   sildenafil (REVATIO) 20 MG tablet Take 3 tablets (60  mg total) by mouth daily as needed. 60 tablet 5   tamsulosin (FLOMAX) 0.4 MG CAPS capsule Take 1 capsule (0.4 mg total) by mouth daily. 30 capsule 0   traMADol (ULTRAM) 50 MG tablet Take 1 tablet (50 mg total) by mouth every 6 (six) hours as needed. 90 tablet 2   Vitamin D, Ergocalciferol, (DRISDOL) 1.25 MG (50000 UNIT) CAPS capsule TAKE 1 CAPSULE (50,000 UNITS TOTAL) BY MOUTH EVERY 7 (SEVEN) DAYS. 12 capsule 0   No current facility-administered medications for this visit.     Past Medical History:  Diagnosis Date   Anxiety    Arthritis    knees - no med   Cluster headache    Hx - resolved per patient   Depression    DVT of lower extremity (deep venous thrombosis) ([place]) 6/26-28/2013   Xarelto - resolved - history   HTN (hypertension)    currently no meds   Irritability and anger    PMH of   Kidney stone    passed stone, no surgery required.   Mild intermittent asthma without complication 1/95/0932   Rotator cuff arthropathy 2007   right   Saddle pulmonary embolus ([place]) 6/26-28/2013   post orthopedic surgery - resolved   Sleep apnea 5/15   moderate OSA-has cpap but does not use it    Past Surgical History:  Procedure Laterality Date   arthroscopic knee surgery  10/09/2011   Dr Ninfa Linden  CARDIAC CATHETERIZATION  06/02/2005   LVdysfunction; normal coronaries   COLONOSCOPY     KNEE ARTHROSCOPY WITH MEDIAL MENISECTOMY Left 09/20/2014   medial menisectomy chondroplastytella medial plica excision  ;  Surgeon: Dorna Leitz, MD;  Location: [facility];  Service: Orthopedics;  Laterality: Left;   ROTATOR CUFF REPAIR  06/02/2005   right   UPPER GASTROINTESTINAL ENDOSCOPY  06/02/2005   normal   WISDOM TOOTH EXTRACTION      Social History   Socioeconomic History   Marital status: Married    Spouse name: Not on file   Number of children: 6   Years of education: 11   Highest education level: Not on file  Occupational History   Occupation: Glass blower/designer     Occupation: Programmer, systems  Tobacco Use   Smoking status: Some Days    Types: Cigars    Passive exposure: Never   Smokeless tobacco: Never   Tobacco comments:    every 3-4 months  Vaping Use   Vaping Use: Never used  Substance and Sexual Activity   Alcohol use: Yes    Alcohol/week: 0.0 standard drinks of alcohol    Comment: drinks occasionally   Drug use: No   Sexual activity: Yes    Partners: Female    Birth control/protection: Condom  Other Topics Concern   Not on file  Social History Narrative   Fun: Exercise   Social Determinants of Health   Financial Resource Strain: Not on file  Food Insecurity: Not on file  Transportation Needs: Not on file  Physical Activity: Not on file  Stress: Not on file  Social Connections: Not on file  Intimate Partner Violence: Not on file    Family History  Problem Relation Age of Onset   Cancer Mother        Mesothelioma    Diabetes Brother    Hypertension Brother    Stroke Brother 11   Heart attack Brother 51   Diabetes Maternal Aunt    Diabetes Maternal Grandmother    Heart disease Maternal Grandmother    Hypertension Maternal Grandmother    Stroke Maternal Grandmother        in 63s   Deep vein thrombosis Maternal Grandmother    Stroke Maternal Grandfather        > 26   Migraines Brother    Heart attack Brother 54   Healthy Father    Colon cancer Neg Hx    Esophageal cancer Neg Hx    Rectal cancer Neg Hx    Stomach cancer Neg Hx    Liver cancer Neg Hx    Pancreatic cancer Neg Hx    Other Neg Hx        low testosterone    ROS: no fevers or chills, productive cough, hemoptysis, dysphasia, odynophagia, melena, hematochezia, dysuria, hematuria, rash, seizure activity, orthopnea, PND, pedal edema, claudication. Remaining systems are negative.  Physical Exam: Well-developed well-nourished in no acute distress.  Skin is warm and dry.  HEENT is normal.  Neck is supple.  Chest is clear to auscultation with normal  expansion.  Cardiovascular exam is regular rate and rhythm.  Abdominal exam nontender or distended. No masses palpated. Extremities show no edema. neuro grossly intact  ECG- personally reviewed  A/P  1 chest pain-  2 hypertension-patient's blood pressure is controlled.  Continue present medical regimen.  3 hyperlipidemia-continue statin.  Kirk Ruths, MD

## 2022-01-23 ENCOUNTER — Other Ambulatory Visit: Payer: Self-pay | Admitting: Pulmonary Disease

## 2022-02-05 ENCOUNTER — Ambulatory Visit: Payer: No Typology Code available for payment source | Attending: Cardiology | Admitting: Cardiology

## 2022-02-08 ENCOUNTER — Other Ambulatory Visit: Payer: Self-pay | Admitting: Pulmonary Disease

## 2022-03-13 ENCOUNTER — Other Ambulatory Visit: Payer: Self-pay | Admitting: Internal Medicine

## 2022-03-13 DIAGNOSIS — I10 Essential (primary) hypertension: Secondary | ICD-10-CM

## 2022-03-19 ENCOUNTER — Ambulatory Visit (INDEPENDENT_AMBULATORY_CARE_PROVIDER_SITE_OTHER): Payer: No Typology Code available for payment source | Admitting: Internal Medicine

## 2022-03-19 ENCOUNTER — Encounter: Payer: Self-pay | Admitting: Internal Medicine

## 2022-03-19 VITALS — BP 138/86 | HR 82 | Temp 97.6°F | Ht 67.0 in | Wt 266.0 lb

## 2022-03-19 DIAGNOSIS — I10 Essential (primary) hypertension: Secondary | ICD-10-CM | POA: Diagnosis not present

## 2022-03-19 DIAGNOSIS — G4733 Obstructive sleep apnea (adult) (pediatric): Secondary | ICD-10-CM | POA: Diagnosis not present

## 2022-03-19 DIAGNOSIS — G4710 Hypersomnia, unspecified: Secondary | ICD-10-CM

## 2022-03-19 MED ORDER — MODAFINIL 200 MG PO TABS: 200.0000 mg | ORAL_TABLET | Freq: Every day | ORAL | 0 refills | Status: DC

## 2022-03-19 NOTE — Patient Instructions (Signed)
Sleep Apnea Sleep apnea is a condition in which breathing pauses or becomes shallow during sleep. People with sleep apnea usually snore loudly. They may have times when they gasp and stop breathing for 10 seconds or more during sleep. This may happen many times during the night. Sleep apnea disrupts your sleep and keeps your body from getting the rest that it needs. This condition can increase your risk of certain health problems, including: Heart attack. Stroke. Obesity. Type 2 diabetes. Heart failure. Irregular heartbeat. High blood pressure. The goal of treatment is to help you breathe normally again. What are the causes?  The most common cause of sleep apnea is a collapsed or blocked airway. There are three kinds of sleep apnea: Obstructive sleep apnea. This kind is caused by a blocked or collapsed airway. Central sleep apnea. This kind happens when the part of the brain that controls breathing does not send the correct signals to the muscles that control breathing. Mixed sleep apnea. This is a combination of obstructive and central sleep apnea. What increases the risk? You are more likely to develop this condition if you: Are overweight. Smoke. Have a smaller than normal airway. Are older. Are male. Drink alcohol. Take sedatives or tranquilizers. Have a family history of sleep apnea. Have a tongue or tonsils that are larger than normal. What are the signs or symptoms? Symptoms of this condition include: Trouble staying asleep. Loud snoring. Morning headaches. Waking up gasping. Dry mouth or sore throat in the morning. Daytime sleepiness and tiredness. If you have daytime fatigue because of sleep apnea, you may be more likely to have: Trouble concentrating. Forgetfulness. Irritability or mood swings. Personality changes. Feelings of depression. Sexual dysfunction. This may include loss of interest if you are male, or erectile dysfunction if you are male. How is this  diagnosed? This condition may be diagnosed with: A medical history. A physical exam. A series of tests that are done while you are sleeping (sleep study). These tests are usually done in a sleep lab, but they may also be done at home. How is this treated? Treatment for this condition aims to restore normal breathing and to ease symptoms during sleep. It may involve managing health issues that can affect breathing, such as high blood pressure or obesity. Treatment may include: Sleeping on your side. Using a decongestant if you have nasal congestion. Avoiding the use of depressants, including alcohol, sedatives, and narcotics. Losing weight if you are overweight. Making changes to your diet. Quitting smoking. Using a device to open your airway while you sleep, such as: An oral appliance. This is a custom-made mouthpiece that shifts your lower jaw forward. A continuous positive airway pressure (CPAP) device. This device blows air through a mask when you breathe out (exhale). A nasal expiratory positive airway pressure (EPAP) device. This device has valves that you put into each nostril. A bi-level positive airway pressure (BIPAP) device. This device blows air through a mask when you breathe in (inhale) and breathe out (exhale). Having surgery if other treatments do not work. During surgery, excess tissue is removed to create a wider airway. Follow these instructions at home: Lifestyle Make any lifestyle changes that your health care provider recommends. Eat a healthy, well-balanced diet. Take steps to lose weight if you are overweight. Avoid using depressants, including alcohol, sedatives, and narcotics. Do not use any products that contain nicotine or tobacco. These products include cigarettes, chewing tobacco, and vaping devices, such as e-cigarettes. If you need help quitting, ask   your health care provider. General instructions Take over-the-counter and prescription medicines only as told  by your health care provider. If you were given a device to open your airway while you sleep, use it only as told by your health care provider. If you are having surgery, make sure to tell your health care provider you have sleep apnea. You may need to bring your device with you. Keep all follow-up visits. This is important. Contact a health care provider if: The device that you received to open your airway during sleep is uncomfortable or does not seem to be working. Your symptoms do not improve. Your symptoms get worse. Get help right away if: You develop: Chest pain. Shortness of breath. Discomfort in your back, arms, or stomach. You have: Trouble speaking. Weakness on one side of your body. Drooping in your face. These symptoms may represent a serious problem that is an emergency. Do not wait to see if the symptoms will go away. Get medical help right away. Call your local emergency services (911 in the [place] Do not drive yourself to the hospital. Summary Sleep apnea is a condition in which breathing pauses or becomes shallow during sleep. The most common cause is a collapsed or blocked airway. The goal of treatment is to restore normal breathing and to ease symptoms during sleep. This information is not intended to replace advice given to you by your health care provider. Make sure you discuss any questions you have with your health care provider. Document Revised: 12/26/2020 Document Reviewed: 04/27/2020 Elsevier Patient Education  2023 [employer]  

## 2022-03-19 NOTE — Progress Notes (Signed)
Subjective:  Patient ID: [name], male    DOB: Jun 25, 1965  Age: 56 y.o. MRN: [id number]  CC: Hypertension   HPI [name] presents for f/up -  He complains of excessive daytime somnolence and nearly falling asleep while driving.  Outpatient Medications Prior to Visit  Medication Sig Dispense Refill   albuterol (VENTOLIN HFA) 108 (90 Base) MCG/ACT inhaler Inhale 1-2 puffs into the lungs every 6 (six) hours as needed for wheezing or shortness of breath. 25.5 g 3   Azelastine HCl 137 MCG/SPRAY SOLN PLACE 1 SPRAY INTO BOTH NOSTRILS AT BEDTIME. USE IN EACH NOSTRIL AS DIRECTED 90 mL 3   Cholecalciferol 50 MCG (2000 UT) TABS Take 1 tablet (2,000 Units total) by mouth daily. 90 tablet 1   Fluticasone-Umeclidin-Vilant (TRELEGY ELLIPTA) 100-62.5-25 MCG/ACT AEPB Inhale 1 puff into the lungs daily. 120 each 1   KLOR-CON M20 20 MEQ tablet TAKE 1 TABLET BY MOUTH TWICE A DAY 180 tablet 1   Multiple Vitamin (MULTIVITAMIN WITH MINERALS) TABS tablet Take 1 tablet by mouth daily.     nitroGLYCERIN (NITROSTAT) 0.3 MG SL tablet Place 0.3 mg under the tongue 2 (two) times daily as needed.     pantoprazole (PROTONIX) 40 MG tablet TAKE 1 TABLET BY MOUTH EVERY DAY 90 tablet 1   rivaroxaban (XARELTO) 10 MG TABS tablet Take 1 tablet (10 mg total) by mouth daily. 90 tablet 1   rosuvastatin (CRESTOR) 10 MG tablet Take 1 tablet (10 mg total) by mouth daily. 90 tablet 1   sildenafil (REVATIO) 20 MG tablet Take 3 tablets (60 mg total) by mouth daily as needed. 60 tablet 5   tamsulosin (FLOMAX) 0.4 MG CAPS capsule Take 1 capsule (0.4 mg total) by mouth daily. 30 capsule 0   traMADol (ULTRAM) 50 MG tablet Take 1 tablet (50 mg total) by mouth every 6 (six) hours as needed. 90 tablet 2   Vitamin D, Ergocalciferol, (DRISDOL) 1.25 MG (50000 UNIT) CAPS capsule TAKE 1 CAPSULE (50,000 UNITS TOTAL) BY MOUTH EVERY 7 (SEVEN) DAYS. 12 capsule 0   cyclobenzaprine (FLEXERIL) 10 MG tablet Take 1 tablet (10 mg total)  by mouth 2 (two) times daily as needed for muscle spasms. 20 tablet 0   levofloxacin (LEVAQUIN) 500 MG tablet Take 1 tablet (500 mg total) by mouth daily. 10 tablet 0   No facility-administered medications prior to visit.    ROS Review of Systems  Constitutional:  Positive for fatigue. Negative for appetite change, chills, diaphoresis and unexpected weight change.  HENT: Negative.    Eyes: Negative.   Respiratory:  Positive for apnea. Negative for cough, chest tightness, shortness of breath and wheezing.   Cardiovascular:  Negative for chest pain, palpitations and leg swelling.  Gastrointestinal:  Negative for abdominal pain, constipation, diarrhea, nausea and vomiting.  Genitourinary: Negative.  Negative for difficulty urinating.  Musculoskeletal: Negative.   Skin: Negative.   Neurological:  Negative for dizziness and weakness.  Hematological:  Negative for adenopathy. Does not bruise/bleed easily.  Psychiatric/Behavioral: Negative.      Objective:  BP 138/86 (BP Location: Left Arm, Patient Position: Sitting, Cuff Size: Large)   Pulse 82   Temp 97.6 F (36.4 C) (Oral)   Ht '5\' 7"'$  (1.702 m)   Wt 266 lb (120.7 kg)   SpO2 93%   BMI 41.66 kg/m   BP Readings from Last 3 Encounters:  03/19/22 138/86  01/15/22 (!) 128/92  01/01/22 (!) 162/61    Wt Readings from Last 3  Encounters:  03/19/22 266 lb (120.7 kg)  01/15/22 271 lb (122.9 kg)  12/31/21 270 lb 9.6 oz (122.7 kg)    Physical Exam Vitals reviewed.  HENT:     Nose: Nose normal.     Mouth/Throat:     Mouth: Mucous membranes are moist.  Eyes:     General: No scleral icterus.    Conjunctiva/sclera: Conjunctivae normal.  Cardiovascular:     Rate and Rhythm: Normal rate and regular rhythm.     Heart sounds: No murmur heard. Pulmonary:     Effort: Pulmonary effort is normal.     Breath sounds: No stridor. No wheezing, rhonchi or rales.  Abdominal:     General: Abdomen is flat.     Palpations: There is no mass.      Tenderness: There is no abdominal tenderness. There is no guarding.     Hernia: No hernia is present.  Musculoskeletal:        General: Normal range of motion.     Cervical back: Neck supple.     Right lower leg: No edema.     Left lower leg: No edema.  Lymphadenopathy:     Cervical: No cervical adenopathy.  Skin:    General: Skin is warm and dry.  Neurological:     General: No focal deficit present.     Mental Status: He is alert.  Psychiatric:        Mood and Affect: Mood normal.        Behavior: Behavior normal.     Lab Results  Component Value Date   WBC 8.7 12/16/2021   HGB 13.4 12/16/2021   HCT 40.2 12/16/2021   PLT 226.0 12/16/2021   GLUCOSE 87 12/16/2021   CHOL 155 12/16/2021   TRIG 138.0 12/16/2021   HDL 48.60 12/16/2021   LDLCALC 79 12/16/2021   ALT 18 12/16/2021   AST 18 12/16/2021   NA 140 12/16/2021   K 4.0 12/16/2021   CL 105 12/16/2021   CREATININE 0.89 12/16/2021   BUN 15 12/16/2021   CO2 26 12/16/2021   TSH 1.93 12/16/2021   PSA 0.27 02/14/2021   INR CANCELED 08/12/2013   HGBA1C 5.7 (H) 12/22/2019    No results found.  Assessment & Plan:   Con was seen today for hypertension.  Diagnoses and all orders for this visit:  Essential hypertension- His blood pressure is adequately well controlled.  Excessive somnolence disorder -     modafinil (PROVIGIL) 200 MG tablet; Take 1 tablet (200 mg total) by mouth daily.  OSA (obstructive sleep apnea) -     modafinil (PROVIGIL) 200 MG tablet; Take 1 tablet (200 mg total) by mouth daily.   I have discontinued Xhaiden Coombs. Harig's cyclobenzaprine and levofloxacin. I am also having him start on modafinil. Additionally, I am having him maintain his multivitamin with minerals, Cholecalciferol, Vitamin D (Ergocalciferol), albuterol, nitroGLYCERIN, sildenafil, Klor-Con M20, pantoprazole, rosuvastatin, rivaroxaban, traMADol, Trelegy Ellipta, tamsulosin, and Azelastine HCl.  Meds ordered this encounter   Medications   modafinil (PROVIGIL) 200 MG tablet    Sig: Take 1 tablet (200 mg total) by mouth daily.    Dispense:  90 tablet    Refill:  0     Follow-up: Return in about 6 months (around 09/18/2022).  Scarlette Calico, MD

## 2022-04-16 NOTE — Progress Notes (Unsigned)
[name] [place] Phone: [phone] Subjective:   [name], am serving as a scribe for Dr. Hulan Saas.  I'm seeing this patient by the request  of:  [name], MD  CC: Bilateral knee pain  OHY:[id number]  01/15/2022 Injection given  worsenign pain, notice more pain with weight gain, discussed weight loss.  Icing  RTC in 3 months   Update 04/17/2022 [name] is a 56 y.o. male coming in with complaint of knee pain still the same as they have been.        Past Medical History:  Diagnosis Date   Anxiety    Arthritis    knees - no med   Cluster headache    Hx - resolved per patient   Depression    DVT of lower extremity (deep venous thrombosis) ([place]) 6/26-28/2013   Xarelto - resolved - history   HTN (hypertension)    currently no meds   Irritability and anger    PMH of   Kidney stone    passed stone, no surgery required.   Mild intermittent asthma without complication 4/85/4627   Rotator cuff arthropathy 2007   right   Saddle pulmonary embolus ([place]) 6/26-28/2013   post orthopedic surgery - resolved   Sleep apnea 5/15   moderate OSA-has cpap but does not use it   Past Surgical History:  Procedure Laterality Date   arthroscopic knee surgery  10/09/2011   Dr Ninfa Linden   CARDIAC CATHETERIZATION  06/02/2005   LVdysfunction; normal coronaries   COLONOSCOPY     KNEE ARTHROSCOPY WITH MEDIAL MENISECTOMY Left 09/20/2014   medial menisectomy chondroplastytella medial plica excision  ;  Surgeon: Dorna Leitz, MD;  Location: [facility];  Service: Orthopedics;  Laterality: Left;   ROTATOR CUFF REPAIR  06/02/2005   right   UPPER GASTROINTESTINAL ENDOSCOPY  06/02/2005   normal   WISDOM TOOTH EXTRACTION     Social History   Socioeconomic History   Marital status: Married    Spouse name: Not on file   Number of children: 6   Years of education: 11   Highest  education level: Not on file  Occupational History   Occupation: Glass blower/designer    Occupation: Programmer, systems  Tobacco Use   Smoking status: Some Days    Types: Cigars    Passive exposure: Never   Smokeless tobacco: Never   Tobacco comments:    every 3-4 months  Vaping Use   Vaping Use: Never used  Substance and Sexual Activity   Alcohol use: Yes    Alcohol/week: 0.0 standard drinks of alcohol    Comment: drinks occasionally   Drug use: No   Sexual activity: Yes    Partners: Female    Birth control/protection: Condom  Other Topics Concern   Not on file  Social History Narrative   Fun: Exercise   Social Determinants of Health   Financial Resource Strain: Not on file  Food Insecurity: Not on file  Transportation Needs: Not on file  Physical Activity: Not on file  Stress: Not on file  Social Connections: Not on file   No Known Allergies Family History  Problem Relation Age of Onset   Cancer Mother        Mesothelioma    Diabetes Brother    Hypertension Brother    Stroke Brother 3   Heart attack Brother 57   Diabetes Maternal Aunt  Diabetes Maternal Grandmother    Heart disease Maternal Grandmother    Hypertension Maternal Grandmother    Stroke Maternal Grandmother        in 23s   Deep vein thrombosis Maternal Grandmother    Stroke Maternal Grandfather        > 2   Migraines Brother    Heart attack Brother 28   Healthy Father    Colon cancer Neg Hx    Esophageal cancer Neg Hx    Rectal cancer Neg Hx    Stomach cancer Neg Hx    Liver cancer Neg Hx    Pancreatic cancer Neg Hx    Other Neg Hx        low testosterone     Current Outpatient Medications (Cardiovascular):    nitroGLYCERIN (NITROSTAT) 0.3 MG SL tablet, Place 0.3 mg under the tongue 2 (two) times daily as needed.   rosuvastatin (CRESTOR) 10 MG tablet, Take 1 tablet (10 mg total) by mouth daily.   sildenafil (REVATIO) 20 MG tablet, Take 3 tablets (60 mg total) by mouth daily as  needed.  Current Outpatient Medications (Respiratory):    albuterol (VENTOLIN HFA) 108 (90 Base) MCG/ACT inhaler, Inhale 1-2 puffs into the lungs every 6 (six) hours as needed for wheezing or shortness of breath.   Azelastine HCl 137 MCG/SPRAY SOLN, PLACE 1 SPRAY INTO BOTH NOSTRILS AT BEDTIME. USE IN EACH NOSTRIL AS DIRECTED   Fluticasone-Umeclidin-Vilant (TRELEGY ELLIPTA) 100-62.5-25 MCG/ACT AEPB, Inhale 1 puff into the lungs daily.  Current Outpatient Medications (Analgesics):    traMADol (ULTRAM) 50 MG tablet, Take 1 tablet (50 mg total) by mouth every 6 (six) hours as needed.  Current Outpatient Medications (Hematological):    rivaroxaban (XARELTO) 10 MG TABS tablet, Take 1 tablet (10 mg total) by mouth daily.  Current Outpatient Medications (Other):    Cholecalciferol 50 MCG (2000 UT) TABS, Take 1 tablet (2,000 Units total) by mouth daily.   KLOR-CON M20 20 MEQ tablet, TAKE 1 TABLET BY MOUTH TWICE A DAY   modafinil (PROVIGIL) 200 MG tablet, Take 1 tablet (200 mg total) by mouth daily.   Multiple Vitamin (MULTIVITAMIN WITH MINERALS) TABS tablet, Take 1 tablet by mouth daily.   pantoprazole (PROTONIX) 40 MG tablet, TAKE 1 TABLET BY MOUTH EVERY DAY   tamsulosin (FLOMAX) 0.4 MG CAPS capsule, Take 1 capsule (0.4 mg total) by mouth daily.   Vitamin D, Ergocalciferol, (DRISDOL) 1.25 MG (50000 UNIT) CAPS capsule, TAKE 1 CAPSULE (50,000 UNITS TOTAL) BY MOUTH EVERY 7 (SEVEN) DAYS.   Reviewed prior external information including notes and imaging from  primary care provider As well as notes that were available from care everywhere and other healthcare systems.  Past medical history, social, surgical and family history all reviewed in electronic medical record.  No pertanent information unless stated regarding to the chief complaint.   Review of Systems:  No headache, visual changes, nausea, vomiting, diarrhea, constipation, dizziness, abdominal pain, skin rash, fevers, chills, night sweats,  weight loss, swollen lymph nodes, body aches, joint swelling, chest pain, shortness of breath, mood changes. POSITIVE muscle aches  Objective  Blood pressure 130/86, pulse 76, height '5\' 7"'$  (1.702 m), weight 268 lb (121.6 kg), SpO2 97 %.   General: No apparent distress alert and oriented x3 mood and affect normal, dressed appropriately.  HEENT: Pupils equal, extraocular movements intact  Respiratory: Patient's speak in full sentences and does not appear short of breath  Cardiovascular: No lower extremity edema, non tender, no erythema  Knee: Bilateral valgus deformity noted. Large thigh to calf ratio.  Tender to palpation over medial and PF joint line.  ROM full in flexion and extension and lower leg rotation. instability with valgus force.  painful patellar compression. Patellar glide with moderate crepitus. Patellar and quadriceps tendons unremarkable. Hamstring and quadriceps strength is normal.  Low back exam does have some loss of lordosis.  After informed written and verbal consent, patient was seated on exam table. Right knee was prepped with alcohol swab and utilizing anterolateral approach, patient's right knee space was injected with 4:1  marcaine 0.5%: Kenalog '40mg'$ /dL. Patient tolerated the procedure well without immediate complications.  After informed written and verbal consent, patient was seated on exam table. Left knee was prepped with alcohol swab and utilizing anterolateral approach, patient's left knee space was injected with 4:1  marcaine 0.5%: Kenalog '40mg'$ /dL. Patient tolerated the procedure well without immediate complications.    Impression and Recommendations:    The above documentation has been reviewed and is accurate and complete Lyndal Pulley, DO

## 2022-04-17 ENCOUNTER — Ambulatory Visit (INDEPENDENT_AMBULATORY_CARE_PROVIDER_SITE_OTHER): Payer: No Typology Code available for payment source | Admitting: Family Medicine

## 2022-04-17 ENCOUNTER — Ambulatory Visit: Payer: No Typology Code available for payment source | Admitting: Family Medicine

## 2022-04-17 VITALS — BP 130/86 | HR 76 | Ht 67.0 in | Wt 268.0 lb

## 2022-04-17 DIAGNOSIS — M5459 Other low back pain: Secondary | ICD-10-CM | POA: Diagnosis not present

## 2022-04-17 DIAGNOSIS — M47816 Spondylosis without myelopathy or radiculopathy, lumbar region: Secondary | ICD-10-CM | POA: Diagnosis not present

## 2022-04-17 DIAGNOSIS — M170 Bilateral primary osteoarthritis of knee: Secondary | ICD-10-CM | POA: Diagnosis not present

## 2022-04-17 DIAGNOSIS — M545 Low back pain, unspecified: Secondary | ICD-10-CM | POA: Diagnosis not present

## 2022-04-17 DIAGNOSIS — G8929 Other chronic pain: Secondary | ICD-10-CM

## 2022-04-17 NOTE — Assessment & Plan Note (Signed)
Facet arthropathy noted.  Discussed icing regimen and home exercises, which activities to do and which ones to avoid.  We will repeat the facet injections the patient has just responded to previously.  We will try to get the approval which patient has had difficulty in the past.  Follow-up again in 6 to 8 weeks after the injection

## 2022-04-17 NOTE — Assessment & Plan Note (Signed)
Discussed HEP  Chronic problem with worsening symptoms.  Still wants to avoid any surgical intervention.  Patient is young and would be looking at elective replacement at this moment.  Follow-up again in 6 to 8 weeks

## 2022-04-17 NOTE — Patient Instructions (Addendum)
Epidural referral

## 2022-05-02 ENCOUNTER — Other Ambulatory Visit: Payer: No Typology Code available for payment source

## 2022-06-06 ENCOUNTER — Other Ambulatory Visit: Payer: No Typology Code available for payment source

## 2022-06-13 ENCOUNTER — Ambulatory Visit
Admission: RE | Admit: 2022-06-13 | Discharge: 2022-06-13 | Disposition: A | Discharge status: Home / Self Care | Payer: No Typology Code available for payment source | Source: Ambulatory Visit | Attending: Family Medicine | Admitting: Family Medicine

## 2022-06-13 MED ORDER — METHYLPREDNISOLONE ACETATE 40 MG/ML INJ SUSP (RADIOLOG
80.0000 mg | Freq: Once | INTRAMUSCULAR | Status: AC
  Administered 2022-06-13: 80 mg via INTRA_ARTICULAR

## 2022-06-13 MED ORDER — IOPAMIDOL (ISOVUE-M 200) INJECTION 41%
1.0000 mL | Freq: Once | INTRAMUSCULAR | Status: AC
  Administered 2022-06-13: 1 mL via INTRA_ARTICULAR

## 2022-06-13 NOTE — Discharge Instructions (Signed)

## 2022-06-23 ENCOUNTER — Telehealth: Payer: Self-pay | Admitting: Internal Medicine

## 2022-06-23 ENCOUNTER — Encounter: Payer: Self-pay | Admitting: Internal Medicine

## 2022-06-23 ENCOUNTER — Ambulatory Visit (INDEPENDENT_AMBULATORY_CARE_PROVIDER_SITE_OTHER): Payer: No Typology Code available for payment source | Admitting: Internal Medicine

## 2022-06-23 VITALS — BP 148/88 | HR 100 | Temp 97.8°F | Resp 16 | Ht 67.0 in | Wt 256.0 lb

## 2022-06-23 DIAGNOSIS — R634 Abnormal weight loss: Secondary | ICD-10-CM

## 2022-06-23 DIAGNOSIS — R112 Nausea with vomiting, unspecified: Secondary | ICD-10-CM | POA: Diagnosis not present

## 2022-06-23 DIAGNOSIS — I10 Essential (primary) hypertension: Secondary | ICD-10-CM | POA: Diagnosis not present

## 2022-06-23 DIAGNOSIS — N529 Male erectile dysfunction, unspecified: Secondary | ICD-10-CM

## 2022-06-23 DIAGNOSIS — Z0001 Encounter for general adult medical examination with abnormal findings: Secondary | ICD-10-CM | POA: Diagnosis not present

## 2022-06-23 DIAGNOSIS — R10817 Generalized abdominal tenderness: Secondary | ICD-10-CM

## 2022-06-23 LAB — URINALYSIS, ROUTINE W REFLEX MICROSCOPIC
Bilirubin Urine: NEGATIVE
Hgb urine dipstick: NEGATIVE
Ketones, ur: NEGATIVE
Leukocytes,Ua: NEGATIVE
Nitrite: NEGATIVE
RBC / HPF: NONE SEEN (ref 0–?)
Specific Gravity, Urine: >=1.03 — AB (ref 1.000–1.030)
Total Protein, Urine: NEGATIVE
Urine Glucose: NEGATIVE
Urobilinogen, UA: 0.2 (ref 0.0–1.0)
pH: 5.5 (ref 5.0–8.0)

## 2022-06-23 LAB — BASIC METABOLIC PANEL
BUN: 12 mg/dL (ref 6–23)
CO2: 32 mEq/L (ref 19–32)
Calcium: 9.9 mg/dL (ref 8.4–10.5)
Chloride: 101 mEq/L (ref 96–112)
Creatinine, Ser: 0.85 mg/dL (ref 0.40–1.50)
GFR: 97 mL/min (ref >60.00)
Glucose, Bld: 79 mg/dL (ref 70–99)
Potassium: 4.9 mEq/L (ref 3.5–5.1)
Sodium: 140 mEq/L (ref 135–145)

## 2022-06-23 LAB — CBC WITH DIFFERENTIAL/PLATELET
Basophils Absolute: 0.1 10*3/uL (ref 0.0–0.1)
Basophils Relative: 0.6 % (ref 0.0–3.0)
Eosinophils Absolute: 0.1 10*3/uL (ref 0.0–0.7)
Eosinophils Relative: 1.4 % (ref 0.0–5.0)
HCT: 44.9 % (ref 39.0–52.0)
Hemoglobin: 14.9 g/dL (ref 13.0–17.0)
Lymphocytes Relative: 25.3 % (ref 12.0–46.0)
Lymphs Abs: 2.6 10*3/uL (ref 0.7–4.0)
MCHC: 33.1 g/dL (ref 30.0–36.0)
MCV: 90.1 fl (ref 78.0–100.0)
Monocytes Absolute: 1.1 10*3/uL — ABNORMAL HIGH (ref 0.1–1.0)
Monocytes Relative: 10.5 % (ref 3.0–12.0)
Neutro Abs: 6.4 10*3/uL (ref 1.4–7.7)
Neutrophils Relative %: 62.2 % (ref 43.0–77.0)
Platelets: 278 10*3/uL (ref 150.0–400.0)
RBC: 4.99 Mil/uL (ref 4.22–5.81)
RDW: 13.9 % (ref 11.5–15.5)
WBC: 10.4 10*3/uL (ref 4.0–10.5)

## 2022-06-23 LAB — HEPATIC FUNCTION PANEL
ALT: 33 U/L (ref 0–53)
AST: 19 U/L (ref 0–37)
Albumin: 4.4 g/dL (ref 3.5–5.2)
Alkaline Phosphatase: 59 U/L (ref 39–117)
Bilirubin, Direct: 0.1 mg/dL (ref 0.0–0.3)
Total Bilirubin: 0.4 mg/dL (ref 0.2–1.2)
Total Protein: 7.7 g/dL (ref 6.0–8.3)

## 2022-06-23 LAB — PSA: PSA: 0.36 ng/mL (ref 0.10–4.00)

## 2022-06-23 LAB — TSH: TSH: 1.63 u[IU]/mL (ref 0.35–5.50)

## 2022-06-23 LAB — LIPASE: Lipase: 38 U/L (ref 11.0–59.0)

## 2022-06-23 LAB — AMYLASE: Amylase: 53 U/L (ref 27–131)

## 2022-06-23 MED ORDER — SILDENAFIL CITRATE 20 MG PO TABS: 60.0000 mg | ORAL_TABLET | Freq: Every day | ORAL | 5 refills | Status: DC | PRN

## 2022-06-23 NOTE — Progress Notes (Signed)
Subjective:  Patient ID: [name], male    DOB: 1966/02/23  Age: 57 y.o. MRN: [id number]  CC: Annual Exam, Hypertension, and Hyperlipidemia   HPI [name] presents for a CPX and f/up -   He is active and denies DOE, CP, SOB, edema.  Outpatient Medications Prior to Visit  Medication Sig Dispense Refill   albuterol (VENTOLIN HFA) 108 (90 Base) MCG/ACT inhaler Inhale 1-2 puffs into the lungs every 6 (six) hours as needed for wheezing or shortness of breath. 25.5 g 3   Azelastine HCl 137 MCG/SPRAY SOLN PLACE 1 SPRAY INTO BOTH NOSTRILS AT BEDTIME. USE IN EACH NOSTRIL AS DIRECTED 90 mL 3   Cholecalciferol 50 MCG (2000 UT) TABS Take 1 tablet (2,000 Units total) by mouth daily. 90 tablet 1   Fluticasone-Umeclidin-Vilant (TRELEGY ELLIPTA) 100-62.5-25 MCG/ACT AEPB Inhale 1 puff into the lungs daily. 120 each 1   KLOR-CON M20 20 MEQ tablet TAKE 1 TABLET BY MOUTH TWICE A DAY 180 tablet 1   modafinil (PROVIGIL) 200 MG tablet Take 1 tablet (200 mg total) by mouth daily. 90 tablet 0   Multiple Vitamin (MULTIVITAMIN WITH MINERALS) TABS tablet Take 1 tablet by mouth daily.     nitroGLYCERIN (NITROSTAT) 0.3 MG SL tablet Place 0.3 mg under the tongue 2 (two) times daily as needed.     pantoprazole (PROTONIX) 40 MG tablet TAKE 1 TABLET BY MOUTH EVERY DAY 90 tablet 1   rivaroxaban (XARELTO) 10 MG TABS tablet Take 1 tablet (10 mg total) by mouth daily. 90 tablet 1   rosuvastatin (CRESTOR) 10 MG tablet Take 1 tablet (10 mg total) by mouth daily. 90 tablet 1   tamsulosin (FLOMAX) 0.4 MG CAPS capsule Take 1 capsule (0.4 mg total) by mouth daily. 30 capsule 0   traMADol (ULTRAM) 50 MG tablet Take 1 tablet (50 mg total) by mouth every 6 (six) hours as needed. 90 tablet 2   Vitamin D, Ergocalciferol, (DRISDOL) 1.25 MG (50000 UNIT) CAPS capsule TAKE 1 CAPSULE (50,000 UNITS TOTAL) BY MOUTH EVERY 7 (SEVEN) DAYS. 12 capsule 0   sildenafil (REVATIO) 20 MG tablet Take 3 tablets (60 mg total) by mouth  daily as needed. 60 tablet 5   No facility-administered medications prior to visit.    ROS Review of Systems  Constitutional:  Positive for unexpected weight change (wt loss). Negative for appetite change, chills, diaphoresis, fatigue and fever.  HENT: Negative.    Eyes: Negative.   Respiratory:  Negative for cough, chest tightness, shortness of breath and wheezing.   Cardiovascular:  Negative for chest pain, palpitations and leg swelling.  Gastrointestinal:  Positive for abdominal pain, nausea and vomiting. Negative for blood in stool, constipation and diarrhea.       Intermittent N/V for one month  Endocrine: Negative.   Genitourinary:  Positive for testicular pain. Negative for difficulty urinating, dysuria, flank pain, hematuria, penile swelling and scrotal swelling.       Right testicular pain for 2 months  Musculoskeletal:  Positive for arthralgias. Negative for back pain and myalgias.  Skin: Negative.   Neurological: Negative.  Negative for dizziness, weakness and light-headedness.  Hematological:  Negative for adenopathy. Does not bruise/bleed easily.  Psychiatric/Behavioral: Negative.      Objective:  BP (!) 148/88 (BP Location: Right Arm, Patient Position: Sitting, Cuff Size: Large)   Pulse 100   Temp 97.8 F (36.6 C) (Oral)   Resp 16   Ht '5\' 7"'$  (1.702 m)   Wt 256 lb (  116.1 kg)   SpO2 96%   BMI 40.10 kg/m   BP Readings from Last 3 Encounters:  06/23/22 (!) 148/88  06/13/22 (!) 145/88  04/17/22 130/86    Wt Readings from Last 3 Encounters:  06/23/22 256 lb (116.1 kg)  04/17/22 268 lb (121.6 kg)  03/19/22 266 lb (120.7 kg)    Physical Exam Vitals reviewed.  Constitutional:      Appearance: Normal appearance.  HENT:     Nose: Nose normal.     Mouth/Throat:     Mouth: Mucous membranes are moist.  Eyes:     General: No scleral icterus.    Conjunctiva/sclera: Conjunctivae normal.  Cardiovascular:     Rate and Rhythm: Normal rate and regular rhythm.      Heart sounds: No murmur heard.    Comments: EKG- NSR, 88 bpm ?LAE No Q waves, LVH, or ST/T wave changes Pulmonary:     Effort: Pulmonary effort is normal.     Breath sounds: No stridor. No wheezing, rhonchi or rales.  Abdominal:     General: Abdomen is flat. Bowel sounds are normal. There is no distension.     Palpations: Abdomen is soft. There is no hepatomegaly, splenomegaly or mass.     Tenderness: There is generalized abdominal tenderness. There is no guarding or rebound.     Hernia: No hernia is present. There is no hernia in the left inguinal area or right inguinal area.  Genitourinary:    Pubic Area: No rash.      Penis: Normal and circumcised.      Testes: Normal.        Right: Mass, tenderness or swelling not present.        Left: Mass, tenderness or swelling not present.     Epididymis:     Right: Normal. Not inflamed or enlarged. No mass.     Left: Normal. Not inflamed or enlarged. No mass.     Prostate: Normal. Not enlarged, not tender and no nodules present.     Rectum: Normal. Guaiac result negative. No mass, tenderness, anal fissure, external hemorrhoid or internal hemorrhoid. Normal anal tone.  Musculoskeletal:        General: Normal range of motion.     Cervical back: Neck supple.     Right lower leg: No edema.     Left lower leg: No edema.  Lymphadenopathy:     Cervical: No cervical adenopathy.     Lower Body: No right inguinal adenopathy. No left inguinal adenopathy.  Skin:    General: Skin is warm and dry.     Coloration: Skin is not pale.  Neurological:     General: No focal deficit present.     Mental Status: He is alert. Mental status is at baseline.  Psychiatric:        Mood and Affect: Mood normal.        Behavior: Behavior normal.     Lab Results  Component Value Date   WBC 10.4 06/23/2022   HGB 14.9 06/23/2022   HCT 44.9 06/23/2022   PLT 278.0 06/23/2022   GLUCOSE 79 06/23/2022   CHOL 155 12/16/2021   TRIG 138.0 12/16/2021   HDL 48.60  12/16/2021   LDLCALC 79 12/16/2021   ALT 33 06/23/2022   AST 19 06/23/2022   NA 140 06/23/2022   K 4.9 06/23/2022   CL 101 06/23/2022   CREATININE 0.85 06/23/2022   BUN 12 06/23/2022   CO2 32 06/23/2022   TSH 1.63 06/23/2022  PSA 0.36 06/23/2022   INR CANCELED 08/12/2013   HGBA1C 5.7 (H) 12/22/2019    DG FACET JT INJ L /S SINGLE LEVEL LEFT W/FL/CT  Result Date: 06/13/2022 CLINICAL DATA:  Low back pain, right greater than left. Good but short-term relief from previous facet injections. EXAM: RIGHT L4-5 FACET INJECTION UNDER FLUOROSCOPY LEFT L4-5 FACET INJECTION UNDER FLUOROSCOPY FLUOROSCOPY: Radiation Exposure Index (as provided by the fluoroscopic device): 6.1 mGy air Kerma TECHNIQUE: Appropriate skin entry sites determined fluoroscopically and marked. Operator donned sterile gloves and mask. Sites prepped with betadine, draped in usual sterile fashion, and infiltrated locally with 1% lidocaine. A 22 gauge spinal needle was advanced to the posterior margin of the left L4-5 facet under fluoroscopy. A 22 gauge spinal needle was advanced to the posterior margin of the right L4-5 facet under fluoroscopy. Diagnostic injection of 0.5 ml Isovue-M 200 at each level confirmed intra-articular/juxta-articular spread without any intrathecal or intravascular component. '80mg'$  Depo-Medrol in 2 ml lidocaine 1% was divided evenly between the 2 sites. The patient tolerated the procedure well. COMPLICATIONS: COMPLICATIONS None IMPRESSION: 1. Technically successful left L4-5 facet injection. 2. Technically successful right L4-5 facet injection. Electronically Signed   By: Lucrezia Europe M.D.   On: 06/13/2022 10:59   DG FACET JT INJ L /S SINGLE LEVEL RIGHT W/FL/CT  Result Date: 06/13/2022 CLINICAL DATA:  Low back pain, right greater than left. Good but short-term relief from previous facet injections. EXAM: RIGHT L4-5 FACET INJECTION UNDER FLUOROSCOPY LEFT L4-5 FACET INJECTION UNDER FLUOROSCOPY FLUOROSCOPY: Radiation  Exposure Index (as provided by the fluoroscopic device): 6.1 mGy air Kerma TECHNIQUE: Appropriate skin entry sites determined fluoroscopically and marked. Operator donned sterile gloves and mask. Sites prepped with betadine, draped in usual sterile fashion, and infiltrated locally with 1% lidocaine. A 22 gauge spinal needle was advanced to the posterior margin of the left L4-5 facet under fluoroscopy. A 22 gauge spinal needle was advanced to the posterior margin of the right L4-5 facet under fluoroscopy. Diagnostic injection of 0.5 ml Isovue-M 200 at each level confirmed intra-articular/juxta-articular spread without any intrathecal or intravascular component. '80mg'$  Depo-Medrol in 2 ml lidocaine 1% was divided evenly between the 2 sites. The patient tolerated the procedure well. COMPLICATIONS: COMPLICATIONS None IMPRESSION: 1. Technically successful left L4-5 facet injection. 2. Technically successful right L4-5 facet injection. Electronically Signed   By: Lucrezia Europe M.D.   On: 06/13/2022 10:59    Assessment & Plan:   [name] was seen today for annual exam, hypertension and hyperlipidemia.  Diagnoses and all orders for this visit:  Essential hypertension- His blood pressure is well controlled. -     EKG 12-Lead -     TSH; Future -     Urinalysis, Routine w reflex microscopic; Future -     Urinalysis, Routine w reflex microscopic -     TSH  Encounter for general adult medical examination with abnormal findings-  Exam completed, labs reviewed, vaccines reviewed, pt ed material was given. -     PSA; Future -     PSA  Nausea and vomiting, unspecified vomiting type- Labs are reassuring but the symptoms and exam are concerning. Will scan the abdomen for pancreatitis/malignancy -     Hepatic function panel; Future -     CBC with Differential/Platelet; Future -     Basic metabolic panel; Future -     Lipase; Future -     Amylase; Future -     Amylase -     Lipase -  Basic metabolic panel -      CBC with Differential/Platelet -     Hepatic function panel -     CT Abdomen Pelvis W Contrast; Future  Abnormal weight loss -     Hepatic function panel; Future -     CBC with Differential/Platelet; Future -     Basic metabolic panel; Future -     Lipase; Future -     Amylase; Future -     Amylase -     Lipase -     Basic metabolic panel -     CBC with Differential/Platelet -     Hepatic function panel -     CT Abdomen Pelvis W Contrast; Future  ERECTILE DYSFUNCTION, ORGANIC -     sildenafil (REVATIO) 20 MG tablet; Take 3 tablets (60 mg total) by mouth daily as needed.  Generalized abdominal tenderness without rebound tenderness -     Hepatic function panel; Future -     CBC with Differential/Platelet; Future -     Basic metabolic panel; Future -     Lipase; Future -     Amylase; Future -     Amylase -     Lipase -     Basic metabolic panel -     CBC with Differential/Platelet -     Hepatic function panel -     CT Abdomen Pelvis W Contrast; Future   I am having [name]. [name] maintain his multivitamin with minerals, Cholecalciferol, Vitamin D (Ergocalciferol), albuterol, nitroGLYCERIN, Klor-Con M20, pantoprazole, rosuvastatin, rivaroxaban, traMADol, Trelegy Ellipta, tamsulosin, Azelastine HCl, modafinil, and sildenafil.  Meds ordered this encounter  Medications   sildenafil (REVATIO) 20 MG tablet    Sig: Take 3 tablets (60 mg total) by mouth daily as needed.    Dispense:  60 tablet    Refill:  5     Follow-up: Return in about 3 months (around 09/22/2022).  Scarlette Calico, MD

## 2022-06-23 NOTE — Patient Instructions (Signed)
Health Maintenance, Male Adopting a healthy lifestyle and getting preventive care are important in promoting health and wellness. Ask your health care provider about: The right schedule for you to have regular tests and exams. Things you can do on your own to prevent diseases and keep yourself healthy. What should I know about diet, weight, and exercise? Eat a healthy diet  Eat a diet that includes plenty of vegetables, fruits, low-fat dairy products, and lean protein. Do not eat a lot of foods that are high in solid fats, added sugars, or sodium. Maintain a healthy weight Body mass index (BMI) is a measurement that can be used to identify possible weight problems. It estimates body fat based on height and weight. Your health care provider can help determine your BMI and help you achieve or maintain a healthy weight. Get regular exercise Get regular exercise. This is one of the most important things you can do for your health. Most adults should: Exercise for at least 150 minutes each week. The exercise should increase your heart rate and make you sweat (moderate-intensity exercise). Do strengthening exercises at least twice a week. This is in addition to the moderate-intensity exercise. Spend less time sitting. Even light physical activity can be beneficial. Watch cholesterol and blood lipids Have your blood tested for lipids and cholesterol at 57 years of age, then have this test every 5 years. You may need to have your cholesterol levels checked more often if: Your lipid or cholesterol levels are high. You are older than 57 years of age. You are at high risk for heart disease. What should I know about cancer screening? Many types of cancers can be detected early and may often be prevented. Depending on your health history and family history, you may need to have cancer screening at various ages. This may include screening for: Colorectal cancer. Prostate cancer. Skin cancer. Lung  cancer. What should I know about heart disease, diabetes, and high blood pressure? Blood pressure and heart disease High blood pressure causes heart disease and increases the risk of stroke. This is more likely to develop in people who have high blood pressure readings or are overweight. Talk with your health care provider about your target blood pressure readings. Have your blood pressure checked: Every 3-5 years if you are 18-39 years of age. Every year if you are 40 years old or older. If you are between the ages of 65 and 75 and are a current or former smoker, ask your health care provider if you should have a one-time screening for abdominal aortic aneurysm (AAA). Diabetes Have regular diabetes screenings. This checks your fasting blood sugar level. Have the screening done: Once every three years after age 45 if you are at a normal weight and have a low risk for diabetes. More often and at a younger age if you are overweight or have a high risk for diabetes. What should I know about preventing infection? Hepatitis B If you have a higher risk for hepatitis B, you should be screened for this virus. Talk with your health care provider to find out if you are at risk for hepatitis B infection. Hepatitis C Blood testing is recommended for: Everyone born from 1945 through 1965. Anyone with known risk factors for hepatitis C. Sexually transmitted infections (STIs) You should be screened each year for STIs, including gonorrhea and chlamydia, if: You are sexually active and are younger than 57 years of age. You are older than 57 years of age and your   health care provider tells you that you are at risk for this type of infection. Your sexual activity has changed since you were last screened, and you are at increased risk for chlamydia or gonorrhea. Ask your health care provider if you are at risk. Ask your health care provider about whether you are at high risk for HIV. Your health care provider  may recommend a prescription medicine to help prevent HIV infection. If you choose to take medicine to prevent HIV, you should first get tested for HIV. You should then be tested every 3 months for as long as you are taking the medicine. Follow these instructions at home: Alcohol use Do not drink alcohol if your health care provider tells you not to drink. If you drink alcohol: Limit how much you have to 0-2 drinks a day. Know how much alcohol is in your drink. In the [place] one drink equals one 12 oz bottle of beer (355 mL), one 5 oz glass of wine (148 mL), or one 1 oz glass of hard liquor (44 mL). Lifestyle Do not use any products that contain nicotine or tobacco. These products include cigarettes, chewing tobacco, and vaping devices, such as e-cigarettes. If you need help quitting, ask your health care provider. Do not use street drugs. Do not share needles. Ask your health care provider for help if you need support or information about quitting drugs. General instructions Schedule regular health, dental, and eye exams. Stay current with your vaccines. Tell your health care provider if: You often feel depressed. You have ever been abused or do not feel safe at home. Summary Adopting a healthy lifestyle and getting preventive care are important in promoting health and wellness. Follow your health care provider's instructions about healthy diet, exercising, and getting tested or screened for diseases. Follow your health care provider's instructions on monitoring your cholesterol and blood pressure. This information is not intended to replace advice given to you by your health care provider. Make sure you discuss any questions you have with your health care provider. Document Revised: 10/08/2020 Document Reviewed: 10/08/2020 Elsevier Patient Education  2023 [employer]  

## 2022-06-23 NOTE — Telephone Encounter (Signed)
Patient called and insists that he had a physical last year and year before - Last physical visit was 12/21/2019.  Patient was very nasty and yelling at me.  He would like for you to call him.  Patient #  [phone]

## 2022-06-24 ENCOUNTER — Encounter: Payer: Self-pay | Admitting: Internal Medicine

## 2022-06-27 ENCOUNTER — Other Ambulatory Visit: Payer: Self-pay | Admitting: Internal Medicine

## 2022-06-30 ENCOUNTER — Encounter: Payer: Self-pay | Admitting: Internal Medicine

## 2022-07-22 ENCOUNTER — Ambulatory Visit
Admission: EM | Admit: 2022-07-22 | Discharge: 2022-07-22 | Disposition: A | Discharge status: Home / Self Care | Payer: No Typology Code available for payment source

## 2022-07-29 ENCOUNTER — Ambulatory Visit
Admission: RE | Admit: 2022-07-29 | Discharge: 2022-07-29 | Disposition: A | Discharge status: Home / Self Care | Payer: No Typology Code available for payment source | Source: Ambulatory Visit | Attending: Internal Medicine | Admitting: Internal Medicine

## 2022-07-29 DIAGNOSIS — R112 Nausea with vomiting, unspecified: Secondary | ICD-10-CM

## 2022-07-29 DIAGNOSIS — R10817 Generalized abdominal tenderness: Secondary | ICD-10-CM

## 2022-07-29 DIAGNOSIS — R634 Abnormal weight loss: Secondary | ICD-10-CM

## 2022-07-29 MED ORDER — IOPAMIDOL (ISOVUE-300) INJECTION 61%
100.0000 mL | Freq: Once | INTRAVENOUS | Status: AC | PRN
  Administered 2022-07-29: 100 mL via INTRAVENOUS

## 2022-08-01 ENCOUNTER — Telehealth: Payer: Self-pay | Admitting: Internal Medicine

## 2022-08-01 ENCOUNTER — Other Ambulatory Visit: Payer: Self-pay | Admitting: Internal Medicine

## 2022-08-01 DIAGNOSIS — N200 Calculus of kidney: Secondary | ICD-10-CM

## 2022-08-01 NOTE — Telephone Encounter (Signed)
Patient called and said he had a CT scan 07/29/22 and that Dr. Ronnald Ramp offered to send a referral to a kidney doctor. He would like to get that referral. Best callback number is [phone].

## 2022-08-05 NOTE — Progress Notes (Unsigned)
[name] [place] Phone: [phone] Subjective:   [name], am serving as a scribe for Dr. Hulan Saas.  I'm seeing this patient by the request  of:  [name], MD  CC: knee pain   [id number]  04/17/2022 Facet arthropathy noted.  Discussed icing regimen and home exercises, which activities to do and which ones to avoid.  We will repeat the facet injections the patient has just responded to previously.  We will try to get the approval which patient has had difficulty in the past.  Follow-up again in 6 to 8 weeks after the injection     Discussed HEP  Chronic problem with worsening symptoms.  Still wants to avoid any surgical intervention.  Patient is young and would be looking at elective replacement at this moment.  Follow-up again in 6 to 8 weeks     Updated 08/06/2022 [name] is a 57 y.o. male coming in with complaint of knee pain Patient states he is ready for his knee injection patient states that his knee is starting to give him more difficulty on a regular basis at this time.    Past Medical History:  Diagnosis Date   Anxiety    Arthritis    knees - no med   Cluster headache    Hx - resolved per patient   Depression    DVT of lower extremity (deep venous thrombosis) ([place]) 6/26-28/2013   Xarelto - resolved - history   HTN (hypertension)    currently no meds   Irritability and anger    PMH of   Kidney stone    passed stone, no surgery required.   Mild intermittent asthma without complication [id number]   Rotator cuff arthropathy 2007   right   Saddle pulmonary embolus ([place]) 6/26-28/2013   post orthopedic surgery - resolved   Sleep apnea 5/15   moderate OSA-has cpap but does not use it   Past Surgical History:  Procedure Laterality Date   arthroscopic knee surgery  10/09/2011   Dr Ninfa Linden   CARDIAC CATHETERIZATION  06/02/2005   LVdysfunction; normal coronaries    COLONOSCOPY     KNEE ARTHROSCOPY WITH MEDIAL MENISECTOMY Left 09/20/2014   medial menisectomy chondroplastytella medial plica excision  ;  Surgeon: Dorna Leitz, MD;  Location: [facility];  Service: Orthopedics;  Laterality: Left;   ROTATOR CUFF REPAIR  06/02/2005   right   UPPER GASTROINTESTINAL ENDOSCOPY  06/02/2005   normal   WISDOM TOOTH EXTRACTION     Social History   Socioeconomic History   Marital status: Married    Spouse name: Not on file   Number of children: 6   Years of education: 11   Highest education level: Not on file  Occupational History   Occupation: Glass blower/designer    Occupation: Programmer, systems  Tobacco Use   Smoking status: Some Days    Types: Cigars    Passive exposure: Never   Smokeless tobacco: Never   Tobacco comments:    every 3-4 months  Vaping Use   Vaping Use: Never used  Substance and Sexual Activity   Alcohol use: Yes    Alcohol/week: 0.0 standard drinks of alcohol    Comment: drinks occasionally   Drug use: No   Sexual activity: Yes    Partners: Female    Birth control/protection: Condom  Other Topics Concern   Not on file  Social History Narrative  Fun: Exercise   Social Determinants of Radio broadcast assistant Strain: Not on file  Food Insecurity: Not on file  Transportation Needs: Not on file  Physical Activity: Not on file  Stress: Not on file  Social Connections: Not on file   No Known Allergies Family History  Problem Relation Age of Onset   Cancer Mother        Mesothelioma    Diabetes Brother    Hypertension Brother    Stroke Brother 22   Heart attack Brother 15   Diabetes Maternal Aunt    Diabetes Maternal Grandmother    Heart disease Maternal Grandmother    Hypertension Maternal Grandmother    Stroke Maternal Grandmother        in 64s   Deep vein thrombosis Maternal Grandmother    Stroke Maternal Grandfather        > 63   Migraines Brother    Heart attack Brother 77   Healthy Father     Colon cancer Neg Hx    Esophageal cancer Neg Hx    Rectal cancer Neg Hx    Stomach cancer Neg Hx    Liver cancer Neg Hx    Pancreatic cancer Neg Hx    Other Neg Hx        low testosterone     Current Outpatient Medications (Cardiovascular):    nitroGLYCERIN (NITROSTAT) 0.3 MG SL tablet, Place 0.3 mg under the tongue 2 (two) times daily as needed.   rosuvastatin (CRESTOR) 10 MG tablet, Take 1 tablet (10 mg total) by mouth daily.   sildenafil (REVATIO) 20 MG tablet, Take 3 tablets (60 mg total) by mouth daily as needed.  Current Outpatient Medications (Respiratory):    albuterol (VENTOLIN HFA) 108 (90 Base) MCG/ACT inhaler, Inhale 1-2 puffs into the lungs every 6 (six) hours as needed for wheezing or shortness of breath.   Azelastine HCl 137 MCG/SPRAY SOLN, PLACE 1 SPRAY INTO BOTH NOSTRILS AT BEDTIME. USE IN EACH NOSTRIL AS DIRECTED   Fluticasone-Umeclidin-Vilant (TRELEGY ELLIPTA) 100-62.5-25 MCG/ACT AEPB, Inhale 1 puff into the lungs daily.  Current Outpatient Medications (Analgesics):    traMADol (ULTRAM) 50 MG tablet, Take 1 tablet (50 mg total) by mouth every 6 (six) hours as needed.  Current Outpatient Medications (Hematological):    rivaroxaban (XARELTO) 10 MG TABS tablet, Take 1 tablet (10 mg total) by mouth daily.  Current Outpatient Medications (Other):    Cholecalciferol 50 MCG (2000 UT) TABS, Take 1 tablet (2,000 Units total) by mouth daily.   gabapentin (NEURONTIN) 100 MG capsule, Take 2 capsules (200 mg total) by mouth at bedtime.   KLOR-CON M20 20 MEQ tablet, TAKE 1 TABLET BY MOUTH TWICE A DAY   modafinil (PROVIGIL) 200 MG tablet, Take 1 tablet (200 mg total) by mouth daily.   Multiple Vitamin (MULTIVITAMIN WITH MINERALS) TABS tablet, Take 1 tablet by mouth daily.   pantoprazole (PROTONIX) 40 MG tablet, TAKE 1 TABLET BY MOUTH EVERY DAY   tamsulosin (FLOMAX) 0.4 MG CAPS capsule, Take 1 capsule (0.4 mg total) by mouth daily.   Vitamin D, Ergocalciferol, (DRISDOL) 1.25  MG (50000 UNIT) CAPS capsule, TAKE 1 CAPSULE (50,000 UNITS TOTAL) BY MOUTH EVERY 7 (SEVEN) DAYS.   Reviewed prior external information including notes and imaging from  primary care provider As well as notes that were available from care everywhere and other healthcare systems.  Past medical history, social, surgical and family history all reviewed in electronic medical record.  No pertanent information unless  stated regarding to the chief complaint.   Review of Systems:  No headache, visual changes, nausea, vomiting, diarrhea, constipation, dizziness, abdominal pain, skin rash, fevers, chills, night sweats, weight loss, swollen lymph nodes, body aches, joint swelling, chest pain, shortness of breath, mood changes. POSITIVE muscle aches  Objective  Blood pressure 130/82, pulse 88, height '5\' 7"'$  (1.702 m), SpO2 92 %.   General: No apparent distress alert and oriented x3 mood and affect normal, dressed appropriately.  HEENT: Pupils equal, extraocular movements intact  Respiratory: Patient's speak in full sentences and does not appear short of breath  Cardiovascular: No lower extremity edema, non tender, no erythema  Knee exam does show effusion noted bilaterally.  Patient does have some crepitus noted.  Lateral tracking of the kneecap is noted.  Instability with valgus and varus force  After informed written and verbal consent, patient was seated on exam table. Right knee was prepped with alcohol swab and utilizing anterolateral approach, patient's right knee space was injected with 4:1  marcaine 0.5%: Kenalog '40mg'$ /dL. Patient tolerated the procedure well without immediate complications.  After informed written and verbal consent, patient was seated on exam table. Left knee was prepped with alcohol swab and utilizing anterolateral approach, patient's left knee space was injected with 4:1  marcaine 0.5%: Kenalog '40mg'$ /dL. Patient tolerated the procedure well without immediate complications.     Impression and Recommendations:     The above documentation has been reviewed and is accurate and complete Lyndal Pulley, DO

## 2022-08-06 ENCOUNTER — Ambulatory Visit (INDEPENDENT_AMBULATORY_CARE_PROVIDER_SITE_OTHER): Payer: No Typology Code available for payment source | Admitting: Family Medicine

## 2022-08-06 VITALS — BP 130/82 | HR 88 | Ht 67.0 in

## 2022-08-06 DIAGNOSIS — M170 Bilateral primary osteoarthritis of knee: Secondary | ICD-10-CM | POA: Diagnosis not present

## 2022-08-06 MED ORDER — GABAPENTIN 100 MG PO CAPS: 200.0000 mg | ORAL_CAPSULE | Freq: Every evening | ORAL | 3 refills | Status: DC

## 2022-08-06 NOTE — Assessment & Plan Note (Signed)
Patient has done relatively well for some time.  Has not been quite as active but is working and doing other things that does cause some discomfort and pain.  Increase activity slowly noted.  Will follow-up again in 6 to 8 weeks

## 2022-08-06 NOTE — Patient Instructions (Addendum)
Good to see you  Bilateral knee injections given today  Gabapentin '200mg'$  nightly  Follow up in 3 month

## 2022-08-25 ENCOUNTER — Encounter: Payer: Self-pay | Admitting: Internal Medicine

## 2022-08-25 ENCOUNTER — Telehealth: Payer: Self-pay | Admitting: Internal Medicine

## 2022-08-25 NOTE — Telephone Encounter (Signed)
Dr Milford Cage from the [facility] office called for permission to stop the pt's Xarelto for 72 hours to perform a shock wave lithotripsy.  Dr. Elmyra Ricks office has requested that we send them documentation to stop the medication for the procedure.   Please call to confirm or send fax:  Phone: [phone] Ext [phone]  Fax: [phone]

## 2022-08-25 NOTE — Telephone Encounter (Signed)
Letter has been faxed to [facility] urology

## 2022-08-27 ENCOUNTER — Other Ambulatory Visit: Payer: Self-pay | Admitting: Urology

## 2022-08-28 NOTE — Progress Notes (Signed)
Patient returned phone call regarding lithotripsy instructions.  Medical history reviewed, medications reviewed, patient states the only medication he takes are vitamins. Advised patient to stop taking vitamins 48 hours before procedure.  Arrival time 0800 Friday April 5th.  Patient has history of OSA, asked to bring cpap day of procedure.  Non-smoker, advised no alcohol for 34 hours prior to procedure.  NPO 6 hours prior to procedure.  Driver: Lattie Haw, spouse  Wear a mask for three days prior to procedure.  Take laxative of choice the day before procedure.  Patient given directions to surgery center.  Patient denies questions of further needs at this time.Patient advised to call back to number provided if any questions prior to procedure.   Durward Parcel RN

## 2022-09-02 ENCOUNTER — Other Ambulatory Visit: Payer: Self-pay | Admitting: Urology

## 2022-09-04 NOTE — H&P (Signed)
Office Visit Report     08/22/2022   --------------------------------------------------------------------------------   [name]  MRN: [id number]  DOB: Aug 21, 1965, 57 year old Male  SSN: [id number]   PRIMARY CARE:  [name], MD  PRIMARY CARE FAX:  [phone]  REFERRING:  [name], MD  PROVIDER:  Harold Barban, M.D.  LOCATION:  [facility] [phone]     --------------------------------------------------------------------------------   CC/HPI: Patient is a 57 year old African-American male seen today for 3 urologic issues: First week was evaluated by PCP for some unexplained abdominal discomfort approximately 1 month ago. Had a CT scan which shows a 6 mm nonobstructing left renal calculus in the lower calyx and some bilateral nonobstructing small simple cysts. Has had no significant nausea vomiting flank pain or hematuria. No prior history of stones. He was interested in evaluating treating left renal calculus.  Second issue is some unexplained right-sided testicular pain which has been intermittent over the last few months. No history of GU trauma. No swelling. Currently asymptomatic.  Third issue is that of erectile dysfunction. He has had decreased rigidity of erections over the last 6 to 12 months. Denies any pain or curvature with erections. Never tried any PDE 5 inhibitors    CLINICAL DATA: Mid abdominal pain.   EXAM:  CT ABDOMEN AND PELVIS WITH CONTRAST   TECHNIQUE:  Multidetector CT imaging of the abdomen and pelvis was performed  using the standard protocol following bolus administration of  intravenous contrast.   RADIATION DOSE REDUCTION: This exam was performed according to the  departmental dose-optimization program which includes automated  exposure control, adjustment of the mA and/or kV according to  patient size and/or use of iterative reconstruction technique.   CONTRAST: 160mL ISOVUE-300 IOPAMIDOL (ISOVUE-300)  INJECTION 61%   COMPARISON: January 02, 2021, September 07, 2017   FINDINGS:  Lower chest: No acute abnormality.   Hepatobiliary: No focal liver abnormality is seen. No gallstones,  gallbladder wall thickening, or biliary dilatation.   Pancreas: Unremarkable. No pancreatic ductal dilatation or  surrounding inflammatory changes.   Spleen: Normal in size without focal abnormality.   Adrenals/Urinary Tract: There is a stable 1.0 cm diameter  low-attenuation right adrenal mass (approximately 49.58 Hounsfield  units). The left adrenal gland is unremarkable. Kidneys are normal  in size, without obstructing renal calculi or hydronephrosis. 3.8 cm  and 1.9 cm diameter simple cysts are seen within the right kidney. A  0.5 cm simple cyst is also seen within the upper pole of the left  kidney. A 6 mm nonobstructing renal calculus is seen within the mid  to lower left kidney. Bladder is unremarkable.   Stomach/Bowel: Stomach is within normal limits. Appendix appears  normal. No evidence of bowel wall thickening, distention, or  inflammatory changes. Noninflamed diverticula are seen within the  distal descending and proximal sigmoid colon.   Vascular/Lymphatic: Aortic atherosclerosis. No enlarged abdominal or  pelvic lymph nodes.   Reproductive: Prostate is unremarkable.   Other: A 3.8 cm x 2.1 cm x 3.1 cm fat containing left-sided para  umbilical hernia is seen. No abdominopelvic ascites.   Musculoskeletal: No acute or significant osseous findings.   IMPRESSION:  1. Colonic diverticulosis.  2. Stable small right adrenal adenoma. No follow-up imaging is  recommended. This recommendation follows ACR consensus guidelines:  Management of Incidental Adrenal Masses: A White Paper of the ACR  Incidental Findings Committee. J Am Coll Radiol 2017;14:1038-1044.  3. 6 mm nonobstructing left renal calculus.  4. Bilateral  simple renal cysts. No follow-up imaging is  recommended. This recommendation  follows ACR consensus guidelines:  Management of the Incidental Renal Mass on CT: A White Paper of the  ACR Incidental Findings Committee. J Am Coll Radiol [phone].  5. Aortic atherosclerosis.   Aortic Atherosclerosis (ICD10-I70.0).    Electronically Signed  By: Virgina Norfolk M.D.  On: 07/31/2022 01:13       ALLERGIES: No Allergies    MEDICATIONS: Multivitamin  Turmeric  Vitamin D3     GU PSH: None   NON-GU PSH: Dental Surgery Procedure Knee Arthroscopy/surgery, Left Shoulder Arthroscopy/surgery, Right     GU PMH: ED due to arterial insufficiency, Erectile dysfunction due to arterial insufficiency - 2014 History of urolithiasis, Nephrolithiasis - 2014 Nocturia, Nocturia - 2014 Polyuria, Polyuria - 2014      PMH Notes:  2012-06-14 13:55:02 - Note: Thrombophlebitis Of Deep Vessels Of The Lower Extremity  2007-02-22 13:12:37 - Note: Arthritis   NON-GU PMH: Personal history of other diseases of the circulatory system, History of hypertension - 2014 Psychogenic ED, Impotence, psychogenic - 2014 Arthritis Asthma DVT, History Encounter for general adult medical examination without abnormal findings, Encounter for preventive health examination Pulmonary Embolism, History Sleep Apnea    FAMILY HISTORY: Blood In Urine - Runs In Family Diabetes - Brother Heart Disease - Brother Hypertension - Brother Lung Cancer - Brother   SOCIAL HISTORY: Marital Status: Married Ethnicity: Not Hispanic Or Latino; Race: Black or African American Current Smoking Status: Patient smokes occasionally.   Tobacco Use Assessment Completed: Used Tobacco in last 30 days? Does not use smokeless tobacco. Does drink.  Does not use drugs. Drinks 1 caffeinated drink per day.     Notes: Never A Smoker, Alcohol Use, Marital History - Single, Tobacco Use, Caffeine Use   REVIEW OF SYSTEMS:    GU Review Male:   Patient reports frequent urination, hard to postpone urination, get up  at night to urinate, leakage of urine, stream starts and stops, and erection problems. Patient denies burning/ pain with urination, trouble starting your stream, have to strain to urinate , and penile pain.  Gastrointestinal (Upper):   Patient reports nausea and vomiting. Patient denies indigestion/ heartburn.  Gastrointestinal (Lower):   Patient denies diarrhea and constipation.  Constitutional:   Patient reports night sweats and fatigue. Patient denies fever and weight loss.  Skin:   Patient denies skin rash/ lesion and itching.  Eyes:   Patient denies blurred vision and double vision.  Ears/ Nose/ Throat:   Patient denies sore throat and sinus problems.  Hematologic/Lymphatic:   Patient denies swollen glands and easy bruising.  Cardiovascular:   Patient reports leg swelling and chest pains.   Respiratory:   Patient reports shortness of breath. Patient denies cough.  Endocrine:   Patient denies excessive thirst.  Musculoskeletal:   Patient reports back pain and joint pain.   Neurological:   Patient denies headaches and dizziness.  Psychologic:   Patient denies depression and anxiety.   Notes: right side scrotal pain    VITAL SIGNS:      08/22/2022 02:50 PM  Weight 257 lb / 116.57 kg  Height 66 in / 167.64 cm  BP 165/90 mmHg  Pulse 82 /min  Temperature 97.5 F / 36.3 C  BMI 41.5 kg/m   GU PHYSICAL EXAMINATION:    Scrotum: No lesions. No edema. No cysts. No warts.  Epididymides: Right: no spermatocele, no masses, no cysts, no tenderness, no induration, no enlargement. Left: no spermatocele, no masses,  no cysts, no tenderness, no induration, no enlargement.  Testes: Testes are descended bilaterally without masses right side testicle is mildly atrophic. He does have some thickening in the spermatic cord likely representing varicocele. This is worse with Valsalva. No obvious varicocele on the left. No inguinal hernias noted.   MULTI-SYSTEM PHYSICAL EXAMINATION:    Constitutional:  Well-nourished. No physical deformities. Normally developed. Good grooming.  Neck: Neck symmetrical, not swollen. Normal tracheal position.  Respiratory: No labored breathing, no use of accessory muscles.   Cardiovascular: Normal temperature, normal extremity pulses, no swelling, no varicosities.  Lymphatic: No enlargement of neck, axillae, groin.  Skin: No paleness, no jaundice, no cyanosis. No lesion, no ulcer, no rash.  Neurologic / Psychiatric: Oriented to time, oriented to place, oriented to person. No depression, no anxiety, no agitation.  Gastrointestinal: No mass, no tenderness, no rigidity, non obese abdomen. Patient has small reducible umbilical hernia  Eyes: Normal conjunctivae. Normal eyelids.  Ears, Nose, Mouth, and Throat: Left ear no scars, no lesions, no masses. Right ear no scars, no lesions, no masses. Nose no scars, no lesions, no masses. Normal hearing. Normal lips.  Musculoskeletal: Normal gait and station of head and neck.     Complexity of Data:   02/23/07  Hormones  Testosterone, Total 2.79     PROCEDURES:          Urinalysis - 81003 Dipstick Dipstick Cont'd  Color: Yellow Bilirubin: Neg mg/dL  Appearance: Clear Ketones: Neg mg/dL  Specific Gravity: 1.030 Blood: Neg ery/uL  pH: <=5.0 Protein: Neg mg/dL  Glucose: Neg mg/dL Urobilinogen: 0.2 mg/dL    Nitrites: Neg    Leukocyte Esterase: Neg leu/uL    ASSESSMENT:      ICD-10 Details  1 GU:   Renal calculus - 0000000 Acute, Complicated Injury  2   ED due to arterial insufficiency - 0000000 Acute, Complicated Injury  3   Varicocele - I86.1 Undiagnosed New Problem  4   Right testicular pain - 99991111 Acute, Complicated Injury   PLAN:            Medications New Meds: Tadalafil 20 mg tablet 1 tablet PO Daily PRN   #30  3 Refill(s)  Pharmacy Name:  [id number]  Address:  [place], [place] [address]  Phone:  [phone]  Fax:  [phone]            Document Letter(s):   Created for Patient: Clinical Summary         Notes:   Discussed treatment based approach for erectile dysfunction. Recommended trial of generic tadalafil at a 10 to 20 mg dose. Knows about interaction with nitroglycerin products takes none of these.   In regards to testicular pain likely could be secondary to varicocele noted on exam. He is asymptomatic we talked about scrotal support and NSAID use as needed. If he has persistent pain we will get him back over here in 2 scrotal ultrasound for further evaluation of the testicle and blood flow.   In regards to left renal calculus. Discussed findings on CT scan and told him I did not think this was contributing to any of his abdominal symptoms. Stone is visualized quite easily on the scout image of the CT scan so KUB was not performed today. We discussed treatment options with possible lithotripsy versus retrograde ureteroscopy and laser lithotripsy and he is interested in proceeding with ESL. Risk and benefits of procedure were discussed as outlined below. Will schedule accordingly.  I have discussed with the patient the risks and consequences of the procedure of extracorporeal shockwave lithotripsy to include, but not limited to: Bleeding, including bleeding in the urine, bleeding around the kidney with hematoma formation and rarely bleeding to the point of loss of the kidney, infection, damage to the surrounding structures including soft tissue and bowel perforation, residual stone fragments requiring the need for future treatments including endoscopic surgery, open surgery or percutaneous surgery. I have emphasized that study showed that up to 25% of patients will require additional procedures depending on stone size and composition. I have also discussed with the patient that the success rate for ESWL is approximately 60-90% and depends on stone location and stone composition as well as preoperative stone size. The patient voices understanding of the  risks and benefits of the above and consents to the procedure.     * Signed by Harold Barban, M.D. on 08/22/22 at 3:30 PM (EDT)*      The information contained in this medical record document is considered private and confidential patient information. This information can only be used for the medical diagnosis and/or medical services that are being provided by the patient's selected caregivers. This information can only be distributed outside of the patient's care if the patient agrees and signs waivers of authorization for this information to be sent to an outside source or route.

## 2022-09-05 ENCOUNTER — Encounter (HOSPITAL_BASED_OUTPATIENT_CLINIC_OR_DEPARTMENT_OTHER)
Admission: RE | Disposition: A | Discharge status: Home / Self Care | Payer: Self-pay | Source: Home / Self Care | Attending: Urology

## 2022-09-05 ENCOUNTER — Ambulatory Visit (HOSPITAL_BASED_OUTPATIENT_CLINIC_OR_DEPARTMENT_OTHER)
Admission: RE | Admit: 2022-09-05 | Discharge: 2022-09-05 | Disposition: A | Discharge status: Home / Self Care | Payer: No Typology Code available for payment source | Attending: Urology | Admitting: Urology

## 2022-09-05 ENCOUNTER — Encounter (HOSPITAL_BASED_OUTPATIENT_CLINIC_OR_DEPARTMENT_OTHER): Payer: Self-pay | Admitting: Urology

## 2022-09-05 ENCOUNTER — Ambulatory Visit (HOSPITAL_COMMUNITY): Payer: No Typology Code available for payment source

## 2022-09-05 ENCOUNTER — Other Ambulatory Visit: Payer: Self-pay

## 2022-09-05 DIAGNOSIS — Z86711 Personal history of pulmonary embolism: Secondary | ICD-10-CM | POA: Insufficient documentation

## 2022-09-05 DIAGNOSIS — N200 Calculus of kidney: Secondary | ICD-10-CM | POA: Diagnosis present

## 2022-09-05 DIAGNOSIS — E669 Obesity, unspecified: Secondary | ICD-10-CM | POA: Insufficient documentation

## 2022-09-05 DIAGNOSIS — G4730 Sleep apnea, unspecified: Secondary | ICD-10-CM | POA: Insufficient documentation

## 2022-09-05 DIAGNOSIS — Z7901 Long term (current) use of anticoagulants: Secondary | ICD-10-CM | POA: Insufficient documentation

## 2022-09-05 DIAGNOSIS — Z6841 Body Mass Index (BMI) 40.0 and over, adult: Secondary | ICD-10-CM | POA: Insufficient documentation

## 2022-09-05 DIAGNOSIS — E119 Type 2 diabetes mellitus without complications: Secondary | ICD-10-CM | POA: Diagnosis not present

## 2022-09-05 HISTORY — PX: EXTRACORPOREAL SHOCK WAVE LITHOTRIPSY: SHX1557

## 2022-09-05 SURGERY — LITHOTRIPSY, ESWL
Anesthesia: LOCAL | Laterality: Left

## 2022-09-05 MED ORDER — DIPHENHYDRAMINE HCL 25 MG PO CAPS
ORAL_CAPSULE | ORAL | Status: AC
  Filled 2022-09-05: qty 1

## 2022-09-05 MED ORDER — CIPROFLOXACIN HCL 500 MG PO TABS
ORAL_TABLET | ORAL | Status: AC
  Filled 2022-09-05: qty 1

## 2022-09-05 MED ORDER — DIAZEPAM 5 MG PO TABS
ORAL_TABLET | ORAL | Status: AC
  Filled 2022-09-05: qty 2

## 2022-09-05 MED ORDER — CIPROFLOXACIN HCL 500 MG PO TABS
500.0000 mg | ORAL_TABLET | ORAL | Status: AC
  Administered 2022-09-05: 500 mg via ORAL

## 2022-09-05 MED ORDER — DIPHENHYDRAMINE HCL 25 MG PO CAPS
25.0000 mg | ORAL_CAPSULE | ORAL | Status: AC
  Administered 2022-09-05: 25 mg via ORAL

## 2022-09-05 MED ORDER — SODIUM CHLORIDE 0.9 % IV SOLN: INTRAVENOUS | Status: DC

## 2022-09-05 MED ORDER — DIAZEPAM 5 MG PO TABS
10.0000 mg | ORAL_TABLET | ORAL | Status: AC
  Administered 2022-09-05: 10 mg via ORAL

## 2022-09-05 NOTE — Discharge Instructions (Signed)
Be sure to make an appointment with your PCP or cardiologist for evaluation.

## 2022-09-05 NOTE — Interval H&P Note (Signed)
History and Physical Interval Note:  09/05/2022 10:17 AM  [name]  has presented today for surgery, with the diagnosis of LEFT RENAL CALCULUS.  The various methods of treatment have been discussed with the patient and family. After consideration of risks, benefits and other options for treatment, the patient has consented to  Procedure(s): EXTRACORPOREAL SHOCK WAVE LITHOTRIPSY (ESWL) (Left) as a surgical intervention.  The patient's history has been reviewed, patient examined, no change in status, stable for surgery.  I have reviewed the patient's chart and labs.  Questions were answered to the patient's satisfaction.  Pt reports he is having chest pains on and off the past couple of months. He saw his PCP Jan 2024 and reported no chest pain. His last EKG was Jan 2024. This stone is in the LLP and non-obstructive. I discussed with [name] and his wife [name] the elective nature of this procedure and recommended cardiac clearance prior to proceeding.    Jerilee Field

## 2022-09-08 ENCOUNTER — Ambulatory Visit: Payer: No Typology Code available for payment source | Admitting: Family Medicine

## 2022-09-08 ENCOUNTER — Encounter (HOSPITAL_BASED_OUTPATIENT_CLINIC_OR_DEPARTMENT_OTHER): Payer: Self-pay | Admitting: Urology

## 2022-09-09 NOTE — Progress Notes (Signed)
Cardiology Clinic Note   Patient Name: [name] Date of Encounter: 09/12/2022  Primary Care Provider:  Etta GrandchildJones, Thomas L, MD Primary Cardiologist:  Olga MillersBrian Crenshaw, MD  Patient Profile    57 year old male with known history of chronic chest discomfort (greater than 20 years), cardiac catheterization 2007 revealing normal coronary arteries with distal LAD intramyocardial bridge without obstruction.  Chest discomfort was found not to be cardiac in etiology.  Other history includes hypertension and hyperlipidemia.   Past Medical History    Past Medical History:  Diagnosis Date   Anxiety    Arthritis    knees - no med   Cluster headache    Hx - resolved per patient   Depression    DVT of lower extremity (deep venous thrombosis) 6/26-28/2013   Xarelto - resolved - history   HTN (hypertension)    currently no meds   Irritability and anger    PMH of   Kidney stone    passed stone, no surgery required.   Mild intermittent asthma without complication 11/13/2020   Rotator cuff arthropathy 2007   right   Saddle pulmonary embolus 6/26-28/2013   post orthopedic surgery - resolved   Sleep apnea 5/15   moderate OSA-has cpap but does not use it   Past Surgical History:  Procedure Laterality Date   arthroscopic knee surgery  10/09/2011   Dr Magnus IvanBlackman   CARDIAC CATHETERIZATION  06/02/2005   LVdysfunction; normal coronaries   COLONOSCOPY     EXTRACORPOREAL SHOCK WAVE LITHOTRIPSY Left 09/05/2022   Procedure: EXTRACORPOREAL SHOCK WAVE LITHOTRIPSY (ESWL);  Surgeon: Jerilee FieldEskridge, Matthew, MD;  [facility] [place];  Service: Urology;  Laterality: Left;   KNEE ARTHROSCOPY WITH MEDIAL MENISECTOMY Left 09/20/2014   medial menisectomy chondroplastytella medial plica excision  ;  Surgeon: Jodi GeraldsJohn Graves, MD;  Location: [facility];  Service: Orthopedics;  Laterality: Left;   ROTATOR CUFF REPAIR  06/02/2005   right   UPPER GASTROINTESTINAL ENDOSCOPY  06/02/2005    normal   WISDOM TOOTH EXTRACTION      Allergies  No Known Allergies  History of Present Illness    [name] comes today with complaints of chest pain.  He states that he was scheduled to have lithotripsy completed by Dr. Jeronimo GreavesMatthew Ekridge but on arrival to have this completed he was complaining of chest pain.  They canceled the procedure and had him follow-up with cardiology.  He describes the pain as a viselike, squeezing across the middle of his chest occurring with and without exertion.  Nothing relieves.  He states that it lasts anywhere from 20 minutes to an hour waxing and waning occurring daily.  Some radiation to left and right arms intermittently.  He does not have associated shortness of breath dizziness fatigue or diaphoresis.  He states anytime he gets up to walk he feels pain in his chest and becoming quiet and sitting still helps.  On review of his past medical history his family history of a brother with an MI at age 57, grandmother dying with an MI, he is continues to smoke cigars, binge drinks alcohol on the weekend, is sedentary, obese, as well as hypertension hyperlipidemia.  He has stopped taking Crestor.  The patient also has OSA and states that he feels tired upon wakening.  He is uncertain if this CPAP machine is working correctly.  Home Medications    Current Outpatient Medications  Medication Sig Dispense Refill   albuterol (VENTOLIN HFA) 108 (90 Base) MCG/ACT inhaler  Inhale 1-2 puffs into the lungs every 6 (six) hours as needed for wheezing or shortness of breath. 25.5 g 3   Azelastine HCl 137 MCG/SPRAY SOLN PLACE 1 SPRAY INTO BOTH NOSTRILS AT BEDTIME. USE IN EACH NOSTRIL AS DIRECTED 90 mL 3   Cholecalciferol 50 MCG (2000 UT) TABS Take 1 tablet (2,000 Units total) by mouth daily. 90 tablet 1   Fluticasone-Umeclidin-Vilant (TRELEGY ELLIPTA) 100-62.5-25 MCG/ACT AEPB Inhale 1 puff into the lungs daily. 120 each 1   gabapentin (NEURONTIN) 100 MG capsule Take 2  capsules (200 mg total) by mouth at bedtime. 30 capsule 3   KLOR-CON M20 20 MEQ tablet TAKE 1 TABLET BY MOUTH TWICE A DAY 180 tablet 1   metoprolol tartrate (LOPRESSOR) 100 MG tablet Take 1 tablet (100 mg total) by mouth once for 1 dose. Take one tablet by mouth 2 hours before procedure. 1 tablet 0   modafinil (PROVIGIL) 200 MG tablet Take 1 tablet (200 mg total) by mouth daily. 90 tablet 0   Multiple Vitamin (MULTIVITAMIN WITH MINERALS) TABS tablet Take 1 tablet by mouth daily.     nitroGLYCERIN (NITROSTAT) 0.3 MG SL tablet Place 0.3 mg under the tongue 2 (two) times daily as needed.     pantoprazole (PROTONIX) 40 MG tablet TAKE 1 TABLET BY MOUTH EVERY DAY 90 tablet 1   rivaroxaban (XARELTO) 10 MG TABS tablet Take 1 tablet (10 mg total) by mouth daily. 90 tablet 1   rosuvastatin (CRESTOR) 10 MG tablet Take 1 tablet (10 mg total) by mouth daily. 90 tablet 1   sildenafil (REVATIO) 20 MG tablet Take 3 tablets (60 mg total) by mouth daily as needed. 60 tablet 5   tamsulosin (FLOMAX) 0.4 MG CAPS capsule Take 1 capsule (0.4 mg total) by mouth daily. 30 capsule 0   traMADol (ULTRAM) 50 MG tablet Take 1 tablet (50 mg total) by mouth every 6 (six) hours as needed. 90 tablet 2   Vitamin D, Ergocalciferol, (DRISDOL) 1.25 MG (50000 UNIT) CAPS capsule TAKE 1 CAPSULE (50,000 UNITS TOTAL) BY MOUTH EVERY 7 (SEVEN) DAYS. 12 capsule 0   No current facility-administered medications for this visit.     Family History    Family History  Problem Relation Age of Onset   Cancer Mother        Mesothelioma    Diabetes Brother    Hypertension Brother    Stroke Brother 50   Heart attack Brother 55   Diabetes Maternal Aunt    Diabetes Maternal Grandmother    Heart disease Maternal Grandmother    Hypertension Maternal Grandmother    Stroke Maternal Grandmother        in 73s   Deep vein thrombosis Maternal Grandmother    Stroke Maternal Grandfather        > 55   Migraines Brother    Heart attack Brother 59    Healthy Father    Colon cancer Neg Hx    Esophageal cancer Neg Hx    Rectal cancer Neg Hx    Stomach cancer Neg Hx    Liver cancer Neg Hx    Pancreatic cancer Neg Hx    Other Neg Hx        low testosterone   He indicated that his mother is deceased. He indicated that his father is deceased. He indicated that the status of his maternal grandmother is unknown. He indicated that the status of his maternal grandfather is unknown. He indicated that the status of his maternal  aunt is unknown. He indicated that the status of his neg hx is unknown.  Social History    Social History   Socioeconomic History   Marital status: Married    Spouse name: Not on file   Number of children: 6   Years of education: 11   Highest education level: Not on file  Occupational History   Occupation: Location managerMachine operator    Occupation: Lobbyistork Lift Driver  Tobacco Use   Smoking status: Some Days    Types: Cigars    Passive exposure: Never   Smokeless tobacco: Never   Tobacco comments:    every 3-4 months  Vaping Use   Vaping Use: Never used  Substance and Sexual Activity   Alcohol use: Yes    Alcohol/week: 0.0 standard drinks of alcohol    Comment: drinks occasionally   Drug use: No   Sexual activity: Yes    Partners: Female    Birth control/protection: Condom  Other Topics Concern   Not on file  Social History Narrative   Fun: Exercise   Social Determinants of Health   Financial Resource Strain: Not on file  Food Insecurity: Not on file  Transportation Needs: Not on file  Physical Activity: Not on file  Stress: Not on file  Social Connections: Not on file  Intimate Partner Violence: Not on file     Review of Systems    General:  No chills, fever, night sweats or weight changes.  Cardiovascular: Positive for chest pain, no dyspnea on exertion, edema, orthopnea, palpitations, paroxysmal nocturnal dyspnea. Dermatological: No rash, lesions/masses Respiratory: No cough, dyspnea Urologic: No  hematuria, dysuria Abdominal:   No nausea, vomiting, diarrhea, bright red blood per rectum, melena, or hematemesis Neurologic:  No visual changes, wkns, changes in mental status. All other systems reviewed and are otherwise negative except as noted above.     Physical Exam    VS:  BP (!) 152/92   Pulse 86   Ht 5\' 7"  (1.702 m)   Wt 267 lb 3.2 oz (121.2 kg)   SpO2 97%   BMI 41.85 kg/m  , BMI Body mass index is 41.85 kg/m.     GEN: Well nourished, well developed, in no acute distress.  Obese HEENT: normal. Neck: Supple, no JVD, carotid bruits, or masses. Cardiac: RRR, no murmurs, rubs, or gallops. No clubbing, cyanosis, edema.  Radials/DP/PT 2+ and equal bilaterally.  Pain is not reproducible on palpation Respiratory:  Respirations regular and unlabored, clear to auscultation bilaterally. GI: Soft, nontender, nondistended, BS + x 4. MS: no deformity or atrophy. Skin: warm and dry, no rash. Neuro:  Strength and sensation are intact. Psych: Normal affect.  Accessory Clinical Findings    ECG personally reviewed by me today-normal sinus rhythm with nonspecific T wave abnormality noted in the lateral leads and in the inferior leads.  Heart rate of 86 bpm- No acute changes  Lab Results  Component Value Date   WBC 10.4 06/23/2022   HGB 14.9 06/23/2022   HCT 44.9 06/23/2022   MCV 90.1 06/23/2022   PLT 278.0 06/23/2022   Lab Results  Component Value Date   CREATININE 0.85 06/23/2022   BUN 12 06/23/2022   NA 140 06/23/2022   K 4.9 06/23/2022   CL 101 06/23/2022   CO2 32 06/23/2022   Lab Results  Component Value Date   ALT 33 06/23/2022   AST 19 06/23/2022   ALKPHOS 59 06/23/2022   BILITOT 0.4 06/23/2022   Lab Results  Component  Value Date   CHOL 155 12/16/2021   HDL 48.60 12/16/2021   LDLCALC 79 12/16/2021   TRIG 138.0 12/16/2021   CHOLHDL 3 12/16/2021    Lab Results  Component Value Date   HGBA1C 5.7 (H) 12/22/2019    Review of Prior Studies: Echocardiogram  02/25/2019 1. Left ventricular ejection fraction, by visual estimation, is 50 to  55%. The left ventricle has normal function. Normal left ventricular size.  There is no left ventricular hypertrophy.   2. Global right ventricle has normal systolic function.The right  ventricular size is normal.   3. Left atrial size was normal.   4. Right atrial size was normal.   5. The mitral valve is normal in structure. Trace mitral valve  regurgitation. No evidence of mitral stenosis.   6. The tricuspid valve is normal in structure. Tricuspid valve  regurgitation is mild.   7. The aortic valve is normal in structure. Aortic valve regurgitation  was not visualized by color flow Doppler. Structurally normal aortic  valve, with no evidence of sclerosis or stenosis.   8. The pulmonic valve was grossly normal. Pulmonic valve regurgitation is  not visualized by color flow Doppler.   9. Low normal LV systolic function.    Assessment & Plan   1.  Recurrent chest pain: The patient has cardiovascular risk factors of hyperlipidemia, obesity, hypertension, family history, and obesity.  He has a sedentary lifestyle.  He did have cardiac catheterization in 2007 with normal coronary arteries.  He states that the pain went away for several years but he is now noticing recurrent chest discomfort viselike with and without exertion.  I will order coronary CTA for diagnostic prognostic purposes.  If coronary CTA is reassuring concerning cardiac etiology of chest discomfort, he would likely be cleared from a cardiac standpoint in order to undergo lithotripsy.  Will await results.  I have explained the procedure to the patient who verbalizes understanding.  I have ordered a BMET in preparation for CTA.  2.  Hypertension: Elevated this office visit.  Currently not on antihypertensive medications.  If elevated on follow-up appointment we will need to institute medications.  3.  Hypercholesterolemia: Being followed by PCP.  Has  been taken off of Crestor.  Defer to PCP for follow-up labs and need to reinstitute statin therapy.  4.  OSA: States compliant with CPAP however he is uncertain if the machine is working correctly.  He is very tired when he awakens in the morning.  I have asked him to follow-up with provider who has ordered CPAP for reevaluation.       Signed, Bettey Mare. Liborio Nixon, ANP, AACC   09/12/2022 4:39 PM      Office [phone] Fax [phone]  Notice: This dictation was prepared with Dragon dictation along with smaller phrase technology. Any transcriptional errors that result from this process are unintentional and may not be corrected upon review.

## 2022-09-12 ENCOUNTER — Ambulatory Visit: Payer: No Typology Code available for payment source | Attending: Adult Health | Admitting: Adult Health

## 2022-09-12 ENCOUNTER — Encounter: Payer: Self-pay | Admitting: Adult Health

## 2022-09-12 VITALS — BP 152/92 | HR 86 | Ht 67.0 in | Wt 267.2 lb

## 2022-09-12 DIAGNOSIS — R079 Chest pain, unspecified: Secondary | ICD-10-CM

## 2022-09-12 DIAGNOSIS — E7800 Pure hypercholesterolemia, unspecified: Secondary | ICD-10-CM

## 2022-09-12 DIAGNOSIS — G4733 Obstructive sleep apnea (adult) (pediatric): Secondary | ICD-10-CM

## 2022-09-12 DIAGNOSIS — I10 Essential (primary) hypertension: Secondary | ICD-10-CM | POA: Diagnosis not present

## 2022-09-12 MED ORDER — METOPROLOL TARTRATE 100 MG PO TABS: 100.0000 mg | ORAL_TABLET | Freq: Once | ORAL | 0 refills | Status: DC

## 2022-09-12 NOTE — Patient Instructions (Addendum)
Medication Instructions:  Take Metoprolol 100 MG tablet 2 hours before your Coronary CT.  *If you need a refill on your cardiac medications before your next appointment, please call your pharmacy*  Lab Work: Your physician recommends that you return for lab work one week before Coronary CT: BMET    Testing/Procedures: Your physician has requested that you have cardiac CT. Cardiac CT Angiography (CTA), is a special type of CT scan that uses a computer to produce multi-dimensional views of major blood vessels throughout the body. In CT angiography, a contrast material is injected through an IV to help visualize the blood vessels. A cardiac CT angiogram is a procedure to look at the heart and the area around the heart. It may be done to help find the cause of chest pains or other symptoms of heart disease. During this procedure, a substance called contrast dye is injected into a vein in the arm. The contrast highlights the blood vessels in the area to be checked. A large X-ray machine (CT scanner), then takes detailed pictures of the heart and the surrounding area. The procedure is also sometimes called a coronary CT angiogram, coronary artery scanning, or CTA.   Follow-Up: At [facility], you and your health needs are our priority.  As part of our continuing mission to provide you with exceptional heart care, we have created designated Provider Care Teams.  These Care Teams include your primary Cardiologist (physician) and Advanced Practice Providers (APPs -  Physician Assistants and Nurse Practitioners) who all work together to provide you with the care you need, when you need it.  We recommend signing up for the patient portal called "MyChart".  Sign up information is provided on this After Visit Summary.  MyChart is used to connect with patients for Virtual Visits (Telemedicine).  Patients are able to view lab/test results, encounter notes, upcoming appointments, etc.  Non-urgent messages  can be sent to your provider as well.   To learn more about what you can do with MyChart, go to [email or web address].    Your next appointment:   2-3 week(s) After Cardiac CTA. Keep scheduled appointment with Dr. Servando Salina.  Provider:   Joni Reining, DNP, ANP      Other Instructions   Your cardiac CT will be scheduled at one of the below locations:   [facility] [address] [place], [place] [address] [phone]  OR  [facility] [address] [place], [place] [address] [phone]  OR   [facility] [address] [place], [place] [address] [phone]  If scheduled at [facility], please arrive at the [facility] and [facility]'s Entrance (Entrance C2) of [facility] 30 minutes prior to test start time. You can use the FREE valet parking offered at entrance C (encouraged to control the heart rate for the test)  Proceed to the [facility] Radiology Department (first floor) to check-in and test prep.  All radiology patients and guests should use entrance C2 at [facility], accessed from [facility], even though the hospital's physical address listed is [address].    If scheduled at [facility] or [facility], please arrive 15 mins early for check-in and test prep.   Please follow these instructions carefully (unless otherwise directed):  Hold all erectile dysfunction medications at least 3 days (72 hrs) prior to test. (Ie viagra, cialis, sildenafil, tadalafil, etc) We will administer nitroglycerin  during this exam.   On the Night Before the Test: Be sure to Drink plenty of water. Do not consume any caffeinated/decaffeinated beverages or chocolate 12 hours prior to your test. Do not take any antihistamines 12 hours prior to your test. If the patient has contrast allergy: Patient will need  a prescription for Prednisone and very clear instructions (as follows): Prednisone 50 mg - take 13 hours prior to test Take another Prednisone 50 mg 7 hours prior to test Take another Prednisone 50 mg 1 hour prior to test Take Benadryl 50 mg 1 hour prior to test Patient must complete all four doses of above prophylactic medications. Patient will need a ride after test due to Benadryl.  On the Day of the Test: Drink plenty of water until 1 hour prior to the test. Do not eat any food 1 hour prior to test. You may take your regular medications prior to the test.  Take metoprolol (Lopressor) two hours prior to test.     After the Test: Drink plenty of water. After receiving IV contrast, you may experience a mild flushed feeling. This is normal. On occasion, you may experience a mild rash up to 24 hours after the test. This is not dangerous. If this occurs, you can take Benadryl 25 mg and increase your fluid intake. If you experience trouble breathing, this can be serious. If it is severe call 911 IMMEDIATELY. If it is mild, please call our office. If you take any of these medications: Glipizide/Metformin, Avandament, Glucavance, please do not take 48 hours after completing test unless otherwise instructed.  We will call to schedule your test 2-4 weeks out understanding that some insurance companies will need an authorization prior to the service being performed.   For non-scheduling related questions, please contact the cardiac imaging nurse navigator should you have any questions/concerns: Rockwell Alexandria, Cardiac Imaging Nurse Navigator Larey Brick, Cardiac Imaging Nurse Navigator [facility] Direct Office Dial: [phone]   For scheduling needs, including cancellations and rescheduling, please call [place], [phone].

## 2022-09-24 ENCOUNTER — Other Ambulatory Visit (HOSPITAL_COMMUNITY): Payer: No Typology Code available for payment source

## 2022-09-25 ENCOUNTER — Ambulatory Visit (INDEPENDENT_AMBULATORY_CARE_PROVIDER_SITE_OTHER): Payer: No Typology Code available for payment source | Admitting: Internal Medicine

## 2022-09-25 ENCOUNTER — Encounter: Payer: Self-pay | Admitting: Internal Medicine

## 2022-09-25 ENCOUNTER — Telehealth: Payer: Self-pay | Admitting: *Deleted

## 2022-09-25 VITALS — BP 136/86 | HR 87 | Temp 97.9°F | Ht 67.0 in | Wt 273.0 lb

## 2022-09-25 DIAGNOSIS — I2089 Other forms of angina pectoris: Secondary | ICD-10-CM | POA: Diagnosis not present

## 2022-09-25 DIAGNOSIS — J4520 Mild intermittent asthma, uncomplicated: Secondary | ICD-10-CM | POA: Diagnosis not present

## 2022-09-25 DIAGNOSIS — I10 Essential (primary) hypertension: Secondary | ICD-10-CM | POA: Diagnosis not present

## 2022-09-25 DIAGNOSIS — D6851 Activated protein C resistance: Secondary | ICD-10-CM

## 2022-09-25 DIAGNOSIS — E785 Hyperlipidemia, unspecified: Secondary | ICD-10-CM

## 2022-09-25 MED ORDER — RIVAROXABAN 10 MG PO TABS: 10.0000 mg | ORAL_TABLET | Freq: Every day | ORAL | 1 refills | Status: DC

## 2022-09-25 MED ORDER — ROSUVASTATIN CALCIUM 10 MG PO TABS: 10.0000 mg | ORAL_TABLET | Freq: Every day | ORAL | 1 refills | Status: DC

## 2022-09-25 MED ORDER — INDAPAMIDE 1.25 MG PO TABS
1.2500 mg | ORAL_TABLET | Freq: Every day | ORAL | 0 refills | Status: DC
Start: 2022-09-25 — End: 2023-01-01

## 2022-09-25 MED ORDER — AIRSUPRA 90-80 MCG/ACT IN AERO
2.0000 | INHALATION_SPRAY | Freq: Four times a day (QID) | RESPIRATORY_TRACT | 1 refills | Status: DC | PRN
Start: 2022-09-25 — End: 2023-10-01

## 2022-09-25 MED ORDER — TRELEGY ELLIPTA 200-62.5-25 MCG/ACT IN AEPB
1.0000 | INHALATION_SPRAY | Freq: Every day | RESPIRATORY_TRACT | 0 refills | Status: DC
Start: 2022-09-25 — End: 2023-01-01

## 2022-09-25 MED ORDER — NITROGLYCERIN 0.3 MG SL SUBL
0.3000 mg | SUBLINGUAL_TABLET | Freq: Two times a day (BID) | SUBLINGUAL | 0 refills | Status: DC | PRN
Start: 2022-09-25 — End: 2023-03-06

## 2022-09-25 NOTE — Patient Instructions (Signed)
Asthma, Adult  Asthma is a long-term (chronic) condition that causes recurrent episodes in which the lower airways in the lungs become tight and narrow. The narrowing is caused by inflammation and tightening of the smooth muscle around the lower airways. Asthma episodes, also called asthma attacks or asthma flares, may cause coughing, making high-pitched whistling sounds when you breathe, most often when you breathe out (wheezing), shortness of breath, and chest pain. The airways may produce extra mucus caused by the inflammation and irritation. During an attack, it can be difficult to breathe. Asthma attacks can range from minor to life-threatening. Asthma cannot be cured, but medicines and lifestyle changes can help control it and treat acute attacks. It is important to keep your asthma well controlled so the condition does not interfere with your daily life. What are the causes? This condition is believed to be caused by inherited (genetic) and environmental factors, but its exact cause is not known. What can trigger an asthma attack? Many things can bring on an asthma attack or make symptoms worse. These triggers are different for every person. Common triggers include: Allergens and irritants like mold, dust, pet dander, cockroaches, pollen, air pollution, and chemical odors. Cigarette smoke. Weather changes and cold air. Stress and strong emotional responses such as crying or laughing hard. Certain medications such as aspirin or beta blockers. Infections and inflammatory conditions, such as the flu, a cold, pneumonia, or inflammation of the nasal membranes (rhinitis). Gastroesophageal reflux disease (GERD). What are the signs or symptoms? Symptoms may occur right after exposure to an asthma trigger or hours later and can vary by person. Common signs and symptoms include: Wheezing. Trouble breathing (shortness of breath). Excessive nighttime or early morning coughing. Chest  tightness. Tiredness (fatigue) with minimal activity. Difficulty talking in complete sentences. Poor exercise tolerance. How is this diagnosed? This condition is diagnosed based on: A physical exam and your medical history. Tests, which may include: Lung function studies to evaluate the flow of air in your lungs. Allergy tests. Imaging tests, such as X-rays. How is this treated? There is no cure, but symptoms can be controlled with proper treatment. Treatment usually involves: Identifying and avoiding your asthma triggers. Inhaled medicines. Two types are commonly used to treat asthma, depending on severity: Controller medicines. These help prevent asthma symptoms from occurring. They are taken every day. Fast-acting reliever or rescue medicines. These quickly relieve asthma symptoms. They are used as needed and provide short-term relief. Using other medicines, such as: Allergy medicines, such as antihistamines, if your asthma attacks are triggered by allergens. Immune medicines (immunomodulators). These are medicines that help control the immune system. Using supplemental oxygen. This is only needed during a severe episode. Creating an asthma action plan. An asthma action plan is a written plan for managing and treating your asthma attacks. This plan includes: A list of your asthma triggers and how to avoid them. Information about when medicines should be taken and when their dosage should be changed. Instructions about using a device called a peak flow meter. A peak flow meter measures how well the lungs are working and the severity of your asthma. It helps you monitor your condition. Follow these instructions at home: Take over-the-counter and prescription medicines only as told by your health care provider. Stay up to date on all vaccinations as recommended by your healthcare provider, including vaccines for the flu and pneumonia. Use a peak flow meter and keep track of your peak flow  readings. Understand and use your asthma   action plan to address any asthma flares. Do not smoke or allow anyone to smoke in your home. Contact a health care provider if: You have wheezing, shortness of breath, or a cough that is not responding to medicines. Your medicines are causing side effects, such as a rash, itching, swelling, or trouble breathing. You need to use a reliever medicine more than 2-3 times a week. Your peak flow reading is still at 50-79% of your personal best after following your action plan for 1 hour. You have a fever and shortness of breath. Get help right away if: You are getting worse and do not respond to treatment during an asthma attack. You are short of breath when at rest or when doing very little physical activity. You have difficulty eating, drinking, or talking. You have chest pain or tightness. You develop a fast heartbeat or palpitations. You have a bluish color to your lips or fingernails. You are light-headed or dizzy, or you faint. Your peak flow reading is less than 50% of your personal best. You feel too tired to breathe normally. These symptoms may be an emergency. Get help right away. Call 911. Do not wait to see if the symptoms will go away. Do not drive yourself to the hospital. Summary Asthma is a long-term (chronic) condition that causes recurrent episodes in which the airways become tight and narrow. Asthma episodes, also called asthma attacks or asthma flares, can cause coughing, wheezing, shortness of breath, and chest pain. Asthma cannot be cured, but medicines and lifestyle changes can help keep it well controlled and prevent asthma flares. Make sure you understand how to avoid triggers and how and when to use your medicines. Asthma attacks can range from minor to life-threatening. Get help right away if you have an asthma attack and do not respond to treatment with your usual rescue medicines. This information is not intended to replace  advice given to you by your health care provider. Make sure you discuss any questions you have with your health care provider. Document Revised: 03/06/2021 Document Reviewed: 02/25/2021 Elsevier Patient Education  2023 [employer]  

## 2022-09-25 NOTE — Telephone Encounter (Signed)
   Pre-operative Risk Assessment    Patient Name: [name]  DOB: 1965-08-22 MRN: [id number]      Request for Surgical Clearance    Procedure:   RIGHT CARPAL TUNNEL RELEASE   Date of Surgery:  Clearance TBD                                 Surgeon:  DR. Gomez Cleverly Surgeon's Group or Practice Name:  [facility] Phone number:  [phone] ATTN: MEGAN DAVIS Fax number:  [phone]   Type of Clearance Requested:   - Medical  - Pharmacy:  Hold Rivaroxaban (Xarelto)     Type of Anesthesia:  Local  AND MAC   Additional requests/questions:    Elpidio Anis   09/25/2022, 11:53 AM

## 2022-09-25 NOTE — Progress Notes (Signed)
Subjective:  Patient ID: [name], male    DOB: 1966/01/18  Age: 57 y.o. MRN: [id number]  CC: Asthma and Hypertension   HPI [name] presents for f/up ---  He has not used Trelegy in at least a month.  He complains of nonproductive cough and wheezing.  He has been evaluated by cardiology and his last episode of chest pain was 2 days ago.  He requests a refill on nitroglycerin.  He complains of weight gain and lower extremity edema.  He denies diaphoresis.  Outpatient Medications Prior to Visit  Medication Sig Dispense Refill   Azelastine HCl 137 MCG/SPRAY SOLN PLACE 1 SPRAY INTO BOTH NOSTRILS AT BEDTIME. USE IN EACH NOSTRIL AS DIRECTED 90 mL 3   Cholecalciferol 50 MCG (2000 UT) TABS Take 1 tablet (2,000 Units total) by mouth daily. 90 tablet 1   gabapentin (NEURONTIN) 100 MG capsule Take 2 capsules (200 mg total) by mouth at bedtime. 30 capsule 3   modafinil (PROVIGIL) 200 MG tablet Take 1 tablet (200 mg total) by mouth daily. 90 tablet 0   Multiple Vitamin (MULTIVITAMIN WITH MINERALS) TABS tablet Take 1 tablet by mouth daily.     pantoprazole (PROTONIX) 40 MG tablet TAKE 1 TABLET BY MOUTH EVERY DAY 90 tablet 1   tamsulosin (FLOMAX) 0.4 MG CAPS capsule Take 1 capsule (0.4 mg total) by mouth daily. 30 capsule 0   traMADol (ULTRAM) 50 MG tablet Take 1 tablet (50 mg total) by mouth every 6 (six) hours as needed. 90 tablet 2   Vitamin D, Ergocalciferol, (DRISDOL) 1.25 MG (50000 UNIT) CAPS capsule TAKE 1 CAPSULE (50,000 UNITS TOTAL) BY MOUTH EVERY 7 (SEVEN) DAYS. 12 capsule 0   albuterol (VENTOLIN HFA) 108 (90 Base) MCG/ACT inhaler Inhale 1-2 puffs into the lungs every 6 (six) hours as needed for wheezing or shortness of breath. 25.5 g 3   Fluticasone-Umeclidin-Vilant (TRELEGY ELLIPTA) 100-62.5-25 MCG/ACT AEPB Inhale 1 puff into the lungs daily. 120 each 1   KLOR-CON M20 20 MEQ tablet TAKE 1 TABLET BY MOUTH TWICE A DAY 180 tablet 1   nitroGLYCERIN (NITROSTAT) 0.3 MG SL tablet  Place 0.3 mg under the tongue 2 (two) times daily as needed.     rivaroxaban (XARELTO) 10 MG TABS tablet Take 1 tablet (10 mg total) by mouth daily. 90 tablet 1   rosuvastatin (CRESTOR) 10 MG tablet Take 1 tablet (10 mg total) by mouth daily. 90 tablet 1   sildenafil (REVATIO) 20 MG tablet Take 3 tablets (60 mg total) by mouth daily as needed. 60 tablet 5   metoprolol tartrate (LOPRESSOR) 100 MG tablet Take 1 tablet (100 mg total) by mouth once for 1 dose. Take one tablet by mouth 2 hours before procedure. 1 tablet 0   No facility-administered medications prior to visit.    ROS Review of Systems  Constitutional:  Positive for unexpected weight change (wt gain). Negative for appetite change, chills, diaphoresis and fatigue.  HENT: Negative.    Respiratory:  Positive for cough and wheezing. Negative for chest tightness and shortness of breath.   Cardiovascular:  Positive for leg swelling. Negative for chest pain and palpitations.  Gastrointestinal:  Negative for abdominal pain, constipation, diarrhea, nausea and vomiting.  Genitourinary: Negative.  Negative for difficulty urinating.  Musculoskeletal:  Positive for arthralgias. Negative for myalgias.  Skin: Negative.  Negative for color change and pallor.  Neurological: Negative.  Negative for dizziness, syncope, weakness and numbness.  Hematological:  Negative for adenopathy. Does not  bruise/bleed easily.  Psychiatric/Behavioral: Negative.      Objective:  BP 136/86 (BP Location: Left Arm, Patient Position: Sitting, Cuff Size: Large)   Pulse 87   Temp 97.9 F (36.6 C) (Oral)   Ht 5\' 7"  (1.702 m)   Wt 273 lb (123.8 kg)   SpO2 93%   BMI 42.76 kg/m   BP Readings from Last 3 Encounters:  09/25/22 136/86  09/12/22 (!) 152/92  09/05/22 (!) 158/107    Wt Readings from Last 3 Encounters:  09/25/22 273 lb (123.8 kg)  09/12/22 267 lb 3.2 oz (121.2 kg)  09/05/22 259 lb 1.6 oz (117.5 kg)    Physical Exam Vitals reviewed.  HENT:      Mouth/Throat:     Mouth: Mucous membranes are moist.  Eyes:     General: No scleral icterus.    Conjunctiva/sclera: Conjunctivae normal.  Cardiovascular:     Rate and Rhythm: Normal rate and regular rhythm.     Heart sounds: No murmur heard. Pulmonary:     Effort: Pulmonary effort is normal.     Breath sounds: No stridor. No wheezing, rhonchi or rales.  Abdominal:     General: Abdomen is protuberant. There is no distension.     Palpations: There is no mass.     Tenderness: There is no abdominal tenderness. There is no guarding.     Hernia: No hernia is present.  Musculoskeletal:        General: No swelling.     Cervical back: Neck supple.     Right lower leg: 1+ Pitting Edema present.     Left lower leg: 1+ Pitting Edema present.  Lymphadenopathy:     Cervical: No cervical adenopathy.  Skin:    General: Skin is warm and dry.  Neurological:     General: No focal deficit present.     Mental Status: He is alert. Mental status is at baseline.     Lab Results  Component Value Date   WBC 10.4 06/23/2022   HGB 14.9 06/23/2022   HCT 44.9 06/23/2022   PLT 278.0 06/23/2022   GLUCOSE 79 06/23/2022   CHOL 155 12/16/2021   TRIG 138.0 12/16/2021   HDL 48.60 12/16/2021   LDLCALC 79 12/16/2021   ALT 33 06/23/2022   AST 19 06/23/2022   NA 140 06/23/2022   K 4.9 06/23/2022   CL 101 06/23/2022   CREATININE 0.85 06/23/2022   BUN 12 06/23/2022   CO2 32 06/23/2022   TSH 1.63 06/23/2022   PSA 0.36 06/23/2022   INR CANCELED 08/12/2013   HGBA1C 5.7 (H) 12/22/2019    DG Abd 1 View  Result Date: 09/07/2022 CLINICAL DATA:  Kidney stone preop lithotripsy EXAM: ABDOMEN - 1 VIEW COMPARISON:  None Available. FINDINGS: The bowel gas pattern is normal. 7 mm calcification overlies the left kidney consistent with a stone. IMPRESSION: Left-sided 7 mm calcification consistent with renal stone. Electronically Signed   By: Layla Maw M.D.   On: 09/07/2022 12:22    Assessment & Plan:    Mild intermittent asthma without complication- Will restart the ICS/LABA/LAMA/SABA inhalers. -     Trelegy Ellipta; Inhale 1 puff into the lungs daily.  Dispense: 120 each; Refill: 0 -     Airsupra; Inhale 2 puffs into the lungs 4 (four) times daily as needed.  Dispense: 32.1 g; Refill: 1  Heterozygous factor V Leiden mutation (HCC) -     Rivaroxaban; Take 1 tablet (10 mg total) by mouth daily.  Dispense:  90 tablet; Refill: 1  Essential hypertension- His blood pressure is not at goal.  Will start indapamide. -     Indapamide; Take 1 tablet (1.25 mg total) by mouth daily.  Dispense: 90 tablet; Refill: 0  Stable angina pectoris -     Nitroglycerin; Place 1 tablet (0.3 mg total) under the tongue 2 (two) times daily as needed.  Dispense: 90 tablet; Refill: 0  Dyslipidemia, goal LDL below 100 -     Rosuvastatin Calcium; Take 1 tablet (10 mg total) by mouth daily.  Dispense: 90 tablet; Refill: 1     Follow-up: Return in about 6 months (around 03/27/2023).  Sanda Linger, MD

## 2022-09-26 ENCOUNTER — Telehealth (HOSPITAL_COMMUNITY): Payer: Self-pay | Admitting: Emergency Medicine

## 2022-09-26 ENCOUNTER — Telehealth (HOSPITAL_COMMUNITY): Payer: Self-pay | Admitting: *Deleted

## 2022-09-26 ENCOUNTER — Ambulatory Visit (HOSPITAL_COMMUNITY): Payer: No Typology Code available for payment source

## 2022-09-26 NOTE — Telephone Encounter (Signed)
Attempted to call patient regarding upcoming cardiac CT appointment. °Left message on voicemail with name and callback number ° °Lamond Glantz RN Navigator Cardiac Imaging °[facility] °[phone] Office °[phone] Cell ° °

## 2022-09-26 NOTE — Telephone Encounter (Signed)
Reaching out to patient to offer assistance regarding upcoming cardiac imaging study; pt verbalizes understanding of appt date/time, parking situation and where to check in, pre-test NPO status and medications ordered, and verified current allergies; name and call back number provided for further questions should they arise Rockwell Alexandria RN Navigator Cardiac Imaging [facility] [phone] office [phone] cell  Holding cialis Aware to arrive at 1100 Taking metop at 930 Aware of contrast and nitro

## 2022-09-29 ENCOUNTER — Other Ambulatory Visit: Payer: Self-pay | Admitting: Cardiology

## 2022-09-29 ENCOUNTER — Ambulatory Visit (HOSPITAL_COMMUNITY)
Admission: RE | Admit: 2022-09-29 | Discharge: 2022-09-29 | Disposition: A | Discharge status: Home / Self Care | Payer: No Typology Code available for payment source | Source: Ambulatory Visit | Attending: Adult Health | Admitting: Adult Health

## 2022-09-29 ENCOUNTER — Ambulatory Visit (HOSPITAL_BASED_OUTPATIENT_CLINIC_OR_DEPARTMENT_OTHER)
Admission: RE | Admit: 2022-09-29 | Discharge: 2022-09-29 | Disposition: A | Discharge status: Home / Self Care | Payer: No Typology Code available for payment source | Source: Ambulatory Visit | Attending: Cardiology | Admitting: Cardiology

## 2022-09-29 DIAGNOSIS — I2510 Atherosclerotic heart disease of native coronary artery without angina pectoris: Secondary | ICD-10-CM

## 2022-09-29 DIAGNOSIS — R931 Abnormal findings on diagnostic imaging of heart and coronary circulation: Secondary | ICD-10-CM | POA: Insufficient documentation

## 2022-09-29 DIAGNOSIS — R079 Chest pain, unspecified: Secondary | ICD-10-CM | POA: Insufficient documentation

## 2022-09-29 MED ORDER — NITROGLYCERIN 0.4 MG SL SUBL
0.8000 mg | SUBLINGUAL_TABLET | Freq: Once | SUBLINGUAL | Status: AC
  Administered 2022-09-29: 0.8 mg via SUBLINGUAL

## 2022-09-29 MED ORDER — METOPROLOL TARTRATE 5 MG/5ML IV SOLN
INTRAVENOUS | Status: AC
  Filled 2022-09-29: qty 10

## 2022-09-29 MED ORDER — IOHEXOL 350 MG/ML SOLN
95.0000 mL | Freq: Once | INTRAVENOUS | Status: AC | PRN
  Administered 2022-09-29: 95 mL via INTRAVENOUS

## 2022-09-29 MED ORDER — METOPROLOL TARTRATE 5 MG/5ML IV SOLN
10.0000 mg | Freq: Once | INTRAVENOUS | Status: AC
  Administered 2022-09-29: 10 mg via INTRAVENOUS

## 2022-09-29 MED ORDER — NITROGLYCERIN 0.4 MG SL SUBL
SUBLINGUAL_TABLET | SUBLINGUAL | Status: AC
  Filled 2022-09-29: qty 2

## 2022-09-30 ENCOUNTER — Telehealth: Payer: Self-pay | Admitting: Cardiology

## 2022-09-30 NOTE — Telephone Encounter (Signed)
   Name:  [name]  DOB:  08-05-1965  MRN:  [id number]   Primary Cardiologist: Olga Millers, MD  Chart reviewed as part of pre-operative protocol coverage. Patient was contacted 09/30/2022 in reference to pre-operative risk assessment for pending surgery as outlined below.  [name] was last seen on 09/12/2022  by Metta Clines, MD.  Since that day [name] completed a coronary CTA that may require additional diagnostic workup before clearance is granted..  Due to new or worsening symptoms, [name] will require a follow-up visit for further pre-operative risk assessment.  Patient may require new surgical clearance request submitted once diagnostic procedures are complete.  - Please contact requesting surgeon's office via preferred method (i.e, phone, fax) to inform them of need for appointment prior to surgery.  Napoleon Form, Leodis Rains, NP 09/30/2022, 4:13 PM

## 2022-09-30 NOTE — Telephone Encounter (Signed)
Pt would like a callback regarding his test results that came to his mychart. Please advise.

## 2022-09-30 NOTE — Telephone Encounter (Signed)
Spoke with pt, aware of CT results and the need for further testing prior to surgery. Follow up appointment moved up to 10/10/22 to see Samara Deist lawrence np to go over testing.

## 2022-09-30 NOTE — Telephone Encounter (Signed)
Patient would like interpretation of his CT results please

## 2022-09-30 NOTE — Telephone Encounter (Signed)
Left message for pt to call.

## 2022-10-01 ENCOUNTER — Telehealth: Payer: Self-pay

## 2022-10-01 ENCOUNTER — Telehealth: Payer: Self-pay | Admitting: Cardiology

## 2022-10-01 NOTE — Telephone Encounter (Signed)
Patient is returning call.  °

## 2022-10-01 NOTE — Telephone Encounter (Signed)
Called patient, just wanted to clarify his upcoming appointment time and date.   Given information, patient did not have any other questions at this time.

## 2022-10-01 NOTE — Telephone Encounter (Addendum)
Called patient regarding results. Left message for patient to call office.----- Message from Jodelle Gross, NP sent at 09/30/2022  1:57 PM EDT ----- I  have reviewed [name] Coronary CTA. He has some blockages in the first part of the circumflex artery.  He will need an appointment sooner than May 24th, ,(which he has with me) to discuss this and talk about a cath.   KL

## 2022-10-01 NOTE — Telephone Encounter (Signed)
Patient returned CMA's call. 

## 2022-10-06 NOTE — Progress Notes (Signed)
Cardiology Clinic Note   Patient Name: [name] Date of Encounter: 10/10/2022  Primary Care Provider:  [name], MD Primary Cardiologist:  Olga Millers, MD  Patient Profile    57 year old with known history of chronic chest discomfort, cath in 2007 7 revealing normal coronary arteries with distal LAD intramyocardial bridge without obstruction., obesity, HTN, sedentary lifestyle,  tobacco abuse, FH of CAD with brother dying of MI, and HL. Due to recurrent chest discomfort which is also associated with diaphoresis, shortness of breath, and radiation to both right and left arms intermittently, a cardiac CTA was planned. Other history includes OSA, non-compliance with statin.  Past Medical History    Past Medical History:  Diagnosis Date   Anxiety    Arthritis    knees - no med   Cluster headache    Hx - resolved per patient   Depression    DVT of lower extremity (deep venous thrombosis) (HCC) 6/26-28/2013   Xarelto - resolved - history   HTN (hypertension)    currently no meds   Irritability and anger    PMH of   Kidney stone    passed stone, no surgery required.   Mild intermittent asthma without complication 11/13/2020   Rotator cuff arthropathy 2007   right   Saddle pulmonary embolus (HCC) 6/26-28/2013   post orthopedic surgery - resolved   Sleep apnea 5/15   moderate OSA-has cpap but does not use it   Past Surgical History:  Procedure Laterality Date   arthroscopic knee surgery  10/09/2011   Dr Magnus Ivan   CARDIAC CATHETERIZATION  06/02/2005   LVdysfunction; normal coronaries   COLONOSCOPY     EXTRACORPOREAL SHOCK WAVE LITHOTRIPSY Left 09/05/2022   Procedure: EXTRACORPOREAL SHOCK WAVE LITHOTRIPSY (ESWL);  Surgeon: Jerilee Field, MD;  Location: [facility];  Service: Urology;  Laterality: Left;   KNEE ARTHROSCOPY WITH MEDIAL MENISECTOMY Left 09/20/2014   medial menisectomy chondroplastytella medial plica excision  ;  Surgeon: Jodi Geralds, MD;  Location: [facility];  Service: Orthopedics;  Laterality: Left;   ROTATOR CUFF REPAIR  06/02/2005   right   UPPER GASTROINTESTINAL ENDOSCOPY  06/02/2005   normal   WISDOM TOOTH EXTRACTION      Allergies  No Known Allergies  History of Present Illness   [name] comes today to discuss cardiac CTA in the setting of recurrent chest pain out of the norm of his chronic chest pain, with multiple cardiac risk factors as outlined above. Cardiac CTA  on 09/29/2022 revealing ostial L Cx disease with FFR 0.75 across lesion in ostial LCX, suggesting lesion is functionally significant.   Mr. Both comes today quite nervous about discussing the cardiac CTA.  He continues to have chest discomfort.  This occurs at rest and occasion with exertion.  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  There is mild associated shortness of breath.  He also does have some chest discomfort with stress and often has to go to a separate room to calm down when this occurs.  He does take nitroglycerin on occasion which she states "helps sometimes".  Home Medications    Current Outpatient Medications  Medication Sig Dispense Refill   Albuterol-Budesonide (AIRSUPRA) 90-80 MCG/ACT AERO Inhale 2 puffs into the lungs 4 (four) times daily as needed. 32.1 g 1   ALPRAZolam (XANAX) 0.5 MG tablet Take 1 tablet (0.5 mg total) by mouth at  bedtime as needed for anxiety. 7 tablet 0   Azelastine HCl 137 MCG/SPRAY SOLN PLACE 1 SPRAY INTO BOTH NOSTRILS AT BEDTIME. USE IN EACH NOSTRIL AS DIRECTED 90 mL 3   Cholecalciferol 50 MCG (2000 UT) TABS Take 1 tablet (2,000 Units total) by mouth daily. 90 tablet 1   Fluticasone-Umeclidin-Vilant (TRELEGY ELLIPTA) 200-62.5-25 MCG/ACT AEPB Inhale 1 puff into the lungs daily. 120 each 0   gabapentin (NEURONTIN) 100 MG capsule Take 2 capsules (200 mg total) by mouth at bedtime. 30 capsule 3   indapamide (LOZOL) 1.25  MG tablet Take 1 tablet (1.25 mg total) by mouth daily. 90 tablet 0   modafinil (PROVIGIL) 200 MG tablet Take 1 tablet (200 mg total) by mouth daily. 90 tablet 0   Multiple Vitamin (MULTIVITAMIN WITH MINERALS) TABS tablet Take 1 tablet by mouth daily.     nitroGLYCERIN (NITROSTAT) 0.3 MG SL tablet Place 1 tablet (0.3 mg total) under the tongue 2 (two) times daily as needed. 90 tablet 0   pantoprazole (PROTONIX) 40 MG tablet TAKE 1 TABLET BY MOUTH EVERY DAY 90 tablet 1   rosuvastatin (CRESTOR) 10 MG tablet Take 1 tablet (10 mg total) by mouth daily. 90 tablet 1   tamsulosin (FLOMAX) 0.4 MG CAPS capsule Take 1 capsule (0.4 mg total) by mouth daily. 30 capsule 0   traMADol (ULTRAM) 50 MG tablet Take 1 tablet (50 mg total) by mouth every 6 (six) hours as needed. 90 tablet 2   Vitamin D, Ergocalciferol, (DRISDOL) 1.25 MG (50000 UNIT) CAPS capsule TAKE 1 CAPSULE (50,000 UNITS TOTAL) BY MOUTH EVERY 7 (SEVEN) DAYS. 12 capsule 0   metoprolol tartrate (LOPRESSOR) 100 MG tablet Take 1 tablet (100 mg total) by mouth once for 1 dose. Take one tablet by mouth 2 hours before procedure. 1 tablet 0   No current facility-administered medications for this visit.     Family History    Family History  Problem Relation Age of Onset   Cancer Mother        Mesothelioma    Diabetes Brother    Hypertension Brother    Stroke Brother 50   Heart attack Brother 55   Diabetes Maternal Aunt    Diabetes Maternal Grandmother    Heart disease Maternal Grandmother    Hypertension Maternal Grandmother    Stroke Maternal Grandmother        in 42s   Deep vein thrombosis Maternal Grandmother    Stroke Maternal Grandfather        > 55   Migraines Brother    Heart attack Brother 50   Healthy Father    Colon cancer Neg Hx    Esophageal cancer Neg Hx    Rectal cancer Neg Hx    Stomach cancer Neg Hx    Liver cancer Neg Hx    Pancreatic cancer Neg Hx    Other Neg Hx        low testosterone   He indicated that his  mother is deceased. He indicated that his father is deceased. He indicated that the status of his maternal grandmother is unknown. He indicated that the status of his maternal grandfather is unknown. He indicated that the status of his maternal aunt is unknown. He indicated that the status of his neg hx is unknown.  Social History    Social History   Socioeconomic History   Marital status: Married    Spouse name: Not on file   Number of children: 6   Years of  education: 11   Highest education level: Not on file  Occupational History   Occupation: Location manager    Occupation: Lobbyist  Tobacco Use   Smoking status: Some Days    Types: Cigars    Passive exposure: Never   Smokeless tobacco: Never   Tobacco comments:    every 3-4 months  Vaping Use   Vaping Use: Never used  Substance and Sexual Activity   Alcohol use: Yes    Alcohol/week: 0.0 standard drinks of alcohol    Comment: drinks occasionally   Drug use: No   Sexual activity: Yes    Partners: Female    Birth control/protection: Condom  Other Topics Concern   Not on file  Social History Narrative   Fun: Exercise   Social Determinants of Health   Financial Resource Strain: Not on file  Food Insecurity: Not on file  Transportation Needs: Not on file  Physical Activity: Not on file  Stress: Not on file  Social Connections: Not on file  Intimate Partner Violence: Not on file     Review of Systems    General:  No chills, fever, night sweats or weight changes.  Cardiovascular: Positive for chest pain, dyspnea on exertion, edema, orthopnea, palpitations, paroxysmal nocturnal dyspnea. Dermatological: No rash, lesions/masses Respiratory: No cough, dyspnea Urologic: No hematuria, dysuria Abdominal:   No nausea, vomiting, diarrhea, bright red blood per rectum, melena, or hematemesis Neurologic:  No visual changes, wkns, changes in mental status. All other systems reviewed and are otherwise negative except  as noted above.     Physical Exam    VS:  BP (!) 152/88   Pulse 77   Ht 5\' 6"  (1.676 m)   Wt 270 lb 9.6 oz (122.7 kg)   SpO2 98%   BMI 43.68 kg/m  , BMI Body mass index is 43.68 kg/m.     GEN: Well nourished, well developed, in no acute distress. HEENT: normal. Neck: Supple, no JVD, carotid bruits, or masses. Cardiac: RRR, no murmurs, rubs, or gallops. No clubbing, cyanosis, edema.  Radials/DP/PT 2+ and equal bilaterally.  Respiratory:  Respirations regular and unlabored, clear to auscultation bilaterally. GI: Soft, nontender, nondistended, BS + x 4. MS: no deformity or atrophy. Skin: warm and dry, no rash. Neuro:  Strength and sensation are intact. Psych: Normal affect.  Anxious  Accessory Clinical Findings    ECG personally reviewed by me today-normal sinus rhythm heart rate 77 bpm.- No acute changes  Lab Results  Component Value Date   WBC 10.4 06/23/2022   HGB 14.9 06/23/2022   HCT 44.9 06/23/2022   MCV 90.1 06/23/2022   PLT 278.0 06/23/2022   Lab Results  Component Value Date   CREATININE 0.85 06/23/2022   BUN 12 06/23/2022   NA 140 06/23/2022   K 4.9 06/23/2022   CL 101 06/23/2022   CO2 32 06/23/2022   Lab Results  Component Value Date   ALT 33 06/23/2022   AST 19 06/23/2022   ALKPHOS 59 06/23/2022   BILITOT 0.4 06/23/2022   Lab Results  Component Value Date   CHOL 155 12/16/2021   HDL 48.60 12/16/2021   LDLCALC 79 12/16/2021   TRIG 138.0 12/16/2021   CHOLHDL 3 12/16/2021    Lab Results  Component Value Date   HGBA1C 5.7 (H) 12/22/2019    Review of Prior Studies:  Cardiac CTA 09/29/2022  FFRct analysis was performed on the original cardiac CT angiogram dataset. Diagrammatic representation of the FFRct analysis is provided  in a separate PDF document in PACS. This dictation was created using the PDF document and an interactive 3D model of the results. 3D model is not available in the EMR/PACS. Normal FFR range is >0.80.   1. Left Main:  No significant stenosis   2. LAD: No significant stenosis   3. LCX: CTFFR 0.75 across lesion in ostial LCX, suggesting lesion is functionally significant   4. RCA: No significant stenosis   IMPRESSION: 1.  CTFFR suggests obstructive CAD in ostial LCX  Assessment & Plan   1.  Coronary artery disease: Abnormal coronary CTA revealing obstructive CAD in the ostial left circumflex, with FFR revealing lesion is functionally significant.  The patient will be scheduled for left heart cath with Cors on May 17 at 7:30 AM with Dr.Tikkani.  This case has been discussed with Dr. Olga Millers prior to the patient's appointment today who is in agreement with patient need to have diagnostic catheterization.  The patient understands that risks include but are not limited to stroke (1 in 1000), death (1 in 1000), kidney failure [usually temporary] (1 in 500), bleeding (1 in 200), allergic reaction [possibly serious] (1 in 200), and agrees to proceed.    Xarelto will be held for 48 hours prior to procedure, and be restarted as soon as hemodynamically stable after cardiac catheterization.  This has been explained to the patient who also verbalizes understanding.  2. Hypercholesterolemia Remains on rosuvastatin 10 mg daily.  Follow-up lipids and LFTs should be completed especially if intervention is necessary precardiac catheterization.  Most recent LDL on 11/30/2021 was 79.  Total cholesterol 155, HDL 48.6.  3.  Anxiety: He is extremely anxious about the upcoming procedure.  He has been prescribed short course of Xanax 0.5 mg to be taken twice daily as needed and on the day of cardiac catheterization.  No refills are being provided, cardiology will not treat anxiety.  If this is necessary for future treatment he should be seen by PCP and discussion of need to continue or change in medication regimen.  4.  Morbid obesity: Weight loss and purposeful exercise are recommended.  If cardiac catheterization does not  reveal need for intervention, weight loss for improvement in health and reduction in progression of cardiovascular disease is beneficial.     Shared Decision Making/Informed Consent The risks [stroke (1 in 1000), death (1 in 1000), kidney failure [usually temporary] (1 in 500), bleeding (1 in 200), allergic reaction [possibly serious] (1 in 200)], benefits (diagnostic support and management of coronary artery disease) and alternatives of a cardiac catheterization were discussed in detail with Mr. Strycker and he is willing to proceed.   Signed, Bettey Mare. Liborio Nixon, ANP, AACC   10/10/2022 4:20 PM      Office [phone] Fax [phone]  Notice: This dictation was prepared with Dragon dictation along with smaller phrase technology. Any transcriptional errors that result from this process are unintentional and may not be corrected upon review.

## 2022-10-06 NOTE — H&P (View-Only) (Signed)
Cardiology Clinic Note   Patient Name: [name] Date of Encounter: 10/10/2022  Primary Care Provider:  Jones, Thomas L, MD Primary Cardiologist:  Brian Crenshaw, MD  Patient Profile    57 year old with known history of chronic chest discomfort, cath in 2007 7 revealing normal coronary arteries with distal LAD intramyocardial bridge without obstruction., obesity, HTN, sedentary lifestyle,  tobacco abuse, FH of CAD with brother dying of MI, and HL. Due to recurrent chest discomfort which is also associated with diaphoresis, shortness of breath, and radiation to both right and left arms intermittently, a cardiac CTA was planned. Other history includes OSA, non-compliance with statin.  Past Medical History    Past Medical History:  Diagnosis Date   Anxiety    Arthritis    knees - no med   Cluster headache    Hx - resolved per patient   Depression    DVT of lower extremity (deep venous thrombosis) (HCC) 6/26-28/2013   Xarelto - resolved - history   HTN (hypertension)    currently no meds   Irritability and anger    PMH of   Kidney stone    passed stone, no surgery required.   Mild intermittent asthma without complication 11/13/2020   Rotator cuff arthropathy 2007   right   Saddle pulmonary embolus (HCC) 6/26-28/2013   post orthopedic surgery - resolved   Sleep apnea 5/15   moderate OSA-has cpap but does not use it   Past Surgical History:  Procedure Laterality Date   arthroscopic knee surgery  10/09/2011   Dr Blackman   CARDIAC CATHETERIZATION  06/02/2005   LVdysfunction; normal coronaries   COLONOSCOPY     EXTRACORPOREAL SHOCK WAVE LITHOTRIPSY Left 09/05/2022   Procedure: EXTRACORPOREAL SHOCK WAVE LITHOTRIPSY (ESWL);  Surgeon: Eskridge, Matthew, MD;  Location: [facility];  Service: Urology;  Laterality: Left;   KNEE ARTHROSCOPY WITH MEDIAL MENISECTOMY Left 09/20/2014   medial menisectomy chondroplastytella medial plica excision  ;  Surgeon: John  Graves, MD;  Location: [facility];  Service: Orthopedics;  Laterality: Left;   ROTATOR CUFF REPAIR  06/02/2005   right   UPPER GASTROINTESTINAL ENDOSCOPY  06/02/2005   normal   WISDOM TOOTH EXTRACTION      Allergies  No Known Allergies  History of Present Illness   Mr. Ricotta comes today to discuss cardiac CTA in the setting of recurrent chest pain out of the norm of his chronic chest pain, with multiple cardiac risk factors as outlined above. Cardiac CTA  on 09/29/2022 revealing ostial L Cx disease with FFR 0.75 across lesion in ostial LCX, suggesting lesion is functionally significant.   Mr. [name] comes today quite nervous about discussing the cardiac CTA.  He continues to have chest discomfort.  This occurs at rest and occasion with exertion.  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  There is mild associated shortness of breath.  He also does have some chest discomfort with stress and often has to go to a separate room to calm down when this occurs.  He does take nitroglycerin on occasion which she states "helps sometimes".  Home Medications    Current Outpatient Medications  Medication Sig Dispense Refill   Albuterol-Budesonide (AIRSUPRA) 90-80 MCG/ACT AERO Inhale 2 puffs into the lungs 4 (four) times daily as needed. 32.1 g 1   ALPRAZolam (XANAX) 0.5 MG tablet Take 1 tablet (0.5 mg total) by mouth at   bedtime as needed for anxiety. 7 tablet 0   Azelastine HCl 137 MCG/SPRAY SOLN PLACE 1 SPRAY INTO BOTH NOSTRILS AT BEDTIME. USE IN EACH NOSTRIL AS DIRECTED 90 mL 3   Cholecalciferol 50 MCG (2000 UT) TABS Take 1 tablet (2,000 Units total) by mouth daily. 90 tablet 1   Fluticasone-Umeclidin-Vilant (TRELEGY ELLIPTA) 200-62.5-25 MCG/ACT AEPB Inhale 1 puff into the lungs daily. 120 each 0   gabapentin (NEURONTIN) 100 MG capsule Take 2 capsules (200 mg total) by mouth at bedtime. 30 capsule 3   indapamide (LOZOL) 1.25  MG tablet Take 1 tablet (1.25 mg total) by mouth daily. 90 tablet 0   modafinil (PROVIGIL) 200 MG tablet Take 1 tablet (200 mg total) by mouth daily. 90 tablet 0   Multiple Vitamin (MULTIVITAMIN WITH MINERALS) TABS tablet Take 1 tablet by mouth daily.     nitroGLYCERIN (NITROSTAT) 0.3 MG SL tablet Place 1 tablet (0.3 mg total) under the tongue 2 (two) times daily as needed. 90 tablet 0   pantoprazole (PROTONIX) 40 MG tablet TAKE 1 TABLET BY MOUTH EVERY DAY 90 tablet 1   rosuvastatin (CRESTOR) 10 MG tablet Take 1 tablet (10 mg total) by mouth daily. 90 tablet 1   tamsulosin (FLOMAX) 0.4 MG CAPS capsule Take 1 capsule (0.4 mg total) by mouth daily. 30 capsule 0   traMADol (ULTRAM) 50 MG tablet Take 1 tablet (50 mg total) by mouth every 6 (six) hours as needed. 90 tablet 2   Vitamin D, Ergocalciferol, (DRISDOL) 1.25 MG (50000 UNIT) CAPS capsule TAKE 1 CAPSULE (50,000 UNITS TOTAL) BY MOUTH EVERY 7 (SEVEN) DAYS. 12 capsule 0   metoprolol tartrate (LOPRESSOR) 100 MG tablet Take 1 tablet (100 mg total) by mouth once for 1 dose. Take one tablet by mouth 2 hours before procedure. 1 tablet 0   No current facility-administered medications for this visit.     Family History    Family History  Problem Relation Age of Onset   Cancer Mother        Mesothelioma    Diabetes Brother    Hypertension Brother    Stroke Brother 50   Heart attack Brother 55   Diabetes Maternal Aunt    Diabetes Maternal Grandmother    Heart disease Maternal Grandmother    Hypertension Maternal Grandmother    Stroke Maternal Grandmother        in 50s   Deep vein thrombosis Maternal Grandmother    Stroke Maternal Grandfather        > 55   Migraines Brother    Heart attack Brother 47   Healthy Father    Colon cancer Neg Hx    Esophageal cancer Neg Hx    Rectal cancer Neg Hx    Stomach cancer Neg Hx    Liver cancer Neg Hx    Pancreatic cancer Neg Hx    Other Neg Hx        low testosterone   He indicated that his  mother is deceased. He indicated that his father is deceased. He indicated that the status of his maternal grandmother is unknown. He indicated that the status of his maternal grandfather is unknown. He indicated that the status of his maternal aunt is unknown. He indicated that the status of his neg hx is unknown.  Social History    Social History   Socioeconomic History   Marital status: Married    Spouse name: Not on file   Number of children: 6   Years of   education: 11   Highest education level: Not on file  Occupational History   Occupation: Machine operator    Occupation: [employer] Driver  Tobacco Use   Smoking status: Some Days    Types: Cigars    Passive exposure: Never   Smokeless tobacco: Never   Tobacco comments:    every 3-4 months  Vaping Use   Vaping Use: Never used  Substance and Sexual Activity   Alcohol use: Yes    Alcohol/week: 0.0 standard drinks of alcohol    Comment: drinks occasionally   Drug use: No   Sexual activity: Yes    Partners: Female    Birth control/protection: Condom  Other Topics Concern   Not on file  Social History Narrative   Fun: Exercise   Social Determinants of Health   Financial Resource Strain: Not on file  Food Insecurity: Not on file  Transportation Needs: Not on file  Physical Activity: Not on file  Stress: Not on file  Social Connections: Not on file  Intimate Partner Violence: Not on file     Review of Systems    General:  No chills, fever, night sweats or weight changes.  Cardiovascular: Positive for chest pain, dyspnea on exertion, edema, orthopnea, palpitations, paroxysmal nocturnal dyspnea. Dermatological: No rash, lesions/masses Respiratory: No cough, dyspnea Urologic: No hematuria, dysuria Abdominal:   No nausea, vomiting, diarrhea, bright red blood per rectum, melena, or hematemesis Neurologic:  No visual changes, wkns, changes in mental status. All other systems reviewed and are otherwise negative except  as noted above.     Physical Exam    VS:  BP (!) 152/88   Pulse 77   Ht 5' 6" (1.676 m)   Wt 270 lb 9.6 oz (122.7 kg)   SpO2 98%   BMI 43.68 kg/m  , BMI Body mass index is 43.68 kg/m.     GEN: Well nourished, well developed, in no acute distress. HEENT: normal. Neck: Supple, no JVD, carotid bruits, or masses. Cardiac: RRR, no murmurs, rubs, or gallops. No clubbing, cyanosis, edema.  Radials/DP/PT 2+ and equal bilaterally.  Respiratory:  Respirations regular and unlabored, clear to auscultation bilaterally. GI: Soft, nontender, nondistended, BS + x 4. MS: no deformity or atrophy. Skin: warm and dry, no rash. Neuro:  Strength and sensation are intact. Psych: Normal affect.  Anxious  Accessory Clinical Findings    ECG personally reviewed by me today-normal sinus rhythm heart rate 77 bpm.- No acute changes  Lab Results  Component Value Date   WBC 10.4 06/23/2022   HGB 14.9 06/23/2022   HCT 44.9 06/23/2022   MCV 90.1 06/23/2022   PLT 278.0 06/23/2022   Lab Results  Component Value Date   CREATININE 0.85 06/23/2022   BUN 12 06/23/2022   NA 140 06/23/2022   K 4.9 06/23/2022   CL 101 06/23/2022   CO2 32 06/23/2022   Lab Results  Component Value Date   ALT 33 06/23/2022   AST 19 06/23/2022   ALKPHOS 59 06/23/2022   BILITOT 0.4 06/23/2022   Lab Results  Component Value Date   CHOL 155 12/16/2021   HDL 48.60 12/16/2021   LDLCALC 79 12/16/2021   TRIG 138.0 12/16/2021   CHOLHDL 3 12/16/2021    Lab Results  Component Value Date   HGBA1C 5.7 (H) 12/22/2019    Review of Prior Studies:  Cardiac CTA 09/29/2022  FFRct analysis was performed on the original cardiac CT angiogram dataset. Diagrammatic representation of the FFRct analysis is provided   in a separate PDF document in PACS. This dictation was created using the PDF document and an interactive 3D model of the results. 3D model is not available in the EMR/PACS. Normal FFR range is >0.80.   1. Left Main:  No significant stenosis   2. LAD: No significant stenosis   3. LCX: CTFFR 0.75 across lesion in ostial LCX, suggesting lesion is functionally significant   4. RCA: No significant stenosis   IMPRESSION: 1.  CTFFR suggests obstructive CAD in ostial LCX  Assessment & Plan   1.  Coronary artery disease: Abnormal coronary CTA revealing obstructive CAD in the ostial left circumflex, with FFR revealing lesion is functionally significant.  The patient will be scheduled for left heart cath with Cors on May 17 at 7:30 AM with Dr.Tikkani.  This case has been discussed with Dr. Brian Crenshaw prior to the patient's appointment today who is in agreement with patient need to have diagnostic catheterization.  The patient understands that risks include but are not limited to stroke (1 in 1000), death (1 in 1000), kidney failure [usually temporary] (1 in 500), bleeding (1 in 200), allergic reaction [possibly serious] (1 in 200), and agrees to proceed.    Xarelto will be held for 48 hours prior to procedure, and be restarted as soon as hemodynamically stable after cardiac catheterization.  This has been explained to the patient who also verbalizes understanding.  2. Hypercholesterolemia Remains on rosuvastatin 10 mg daily.  Follow-up lipids and LFTs should be completed especially if intervention is necessary precardiac catheterization.  Most recent LDL on 11/30/2021 was 79.  Total cholesterol 155, HDL 48.6.  3.  Anxiety: He is extremely anxious about the upcoming procedure.  He has been prescribed short course of Xanax 0.5 mg to be taken twice daily as needed and on the day of cardiac catheterization.  No refills are being provided, cardiology will not treat anxiety.  If this is necessary for future treatment he should be seen by PCP and discussion of need to continue or change in medication regimen.  4.  Morbid obesity: Weight loss and purposeful exercise are recommended.  If cardiac catheterization does not  reveal need for intervention, weight loss for improvement in health and reduction in progression of cardiovascular disease is beneficial.     Shared Decision Making/Informed Consent The risks [stroke (1 in 1000), death (1 in 1000), kidney failure [usually temporary] (1 in 500), bleeding (1 in 200), allergic reaction [possibly serious] (1 in 200)], benefits (diagnostic support and management of coronary artery disease) and alternatives of a cardiac catheterization were discussed in detail with Mr. Asselin and he is willing to proceed.   Signed, Halston Kintz M. Elizeo Rodriques DNP, ANP, AACC   10/10/2022 4:20 PM      Office [phone] Fax [phone]  Notice: This dictation was prepared with Dragon dictation along with smaller phrase technology. Any transcriptional errors that result from this process are unintentional and may not be corrected upon review.  

## 2022-10-10 ENCOUNTER — Other Ambulatory Visit: Payer: Self-pay | Admitting: Adult Health

## 2022-10-10 ENCOUNTER — Ambulatory Visit: Payer: No Typology Code available for payment source | Attending: Adult Health | Admitting: Adult Health

## 2022-10-10 ENCOUNTER — Encounter: Payer: Self-pay | Admitting: Adult Health

## 2022-10-10 VITALS — BP 152/88 | HR 77 | Ht 66.0 in | Wt 270.6 lb

## 2022-10-10 DIAGNOSIS — E7800 Pure hypercholesterolemia, unspecified: Secondary | ICD-10-CM | POA: Diagnosis not present

## 2022-10-10 DIAGNOSIS — R931 Abnormal findings on diagnostic imaging of heart and coronary circulation: Secondary | ICD-10-CM | POA: Diagnosis not present

## 2022-10-10 DIAGNOSIS — I2510 Atherosclerotic heart disease of native coronary artery without angina pectoris: Secondary | ICD-10-CM

## 2022-10-10 DIAGNOSIS — I10 Essential (primary) hypertension: Secondary | ICD-10-CM

## 2022-10-10 DIAGNOSIS — R4589 Other symptoms and signs involving emotional state: Secondary | ICD-10-CM

## 2022-10-10 MED ORDER — ALPRAZOLAM 0.5 MG PO TABS: 0.5000 mg | ORAL_TABLET | Freq: Every evening | ORAL | 0 refills | Status: DC | PRN

## 2022-10-10 NOTE — Patient Instructions (Signed)
Medication Instructions:  Xanax 0.5 mg ( Take 1 Tablet As Needed) *If you need a refill on your cardiac medications before your next appointment, please call your pharmacy*   Lab Work: BMET, CBC Today If you have labs (blood work) drawn today and your tests are completely normal, you will receive your results only by: MyChart Message (if you have MyChart) OR A paper copy in the mail If you have any lab test that is abnormal or we need to change your treatment, we will call you to review the results.   Testing/Procedures:       Cardiac/Peripheral Catheterization   You are scheduled for a Cardiac Catheterization on Friday, May 17 with Dr. Alverda Skeans.  1. Please arrive at the [facility] (Main Entrance A) at [facility]: [address] [place], [place] [address] at 5:30 AM (This time is 2 hour(s) before your procedure to ensure your preparation). Free valet parking service is available. You will check in at ADMITTING. The support person will be asked to wait in the waiting room.  It is OK to have someone drop you off and come back when you are ready to be discharged.        Special note: Every effort is made to have your procedure done on time. Please understand that emergencies sometimes delay scheduled procedures.  2. Diet: Do not eat solid foods after midnight.  You may have clear liquids until 5 AM the day of the procedure.  3. Labs: You will need to have blood drawn on Friday, May 10 at [facility] [address], [place]  Open: 8am - 5pm (Lunch 12:30 - 1:30)   Phone: [phone]. You do not need to be fasting.  4. Medication instructions in preparation for your procedure:   Contrast Allergy: No    Current Outpatient Medications (Cardiovascular):    indapamide (LOZOL) 1.25 MG tablet, Take 1 tablet (1.25 mg total) by mouth daily.   nitroGLYCERIN (NITROSTAT) 0.3 MG SL tablet, Place 1 tablet (0.3 mg total) under the tongue 2 (two) times daily as  needed.   rosuvastatin (CRESTOR) 10 MG tablet, Take 1 tablet (10 mg total) by mouth daily.   metoprolol tartrate (LOPRESSOR) 100 MG tablet, Take 1 tablet (100 mg total) by mouth once for 1 dose. Take one tablet by mouth 2 hours before procedure.  Current Outpatient Medications (Respiratory):    Albuterol-Budesonide (AIRSUPRA) 90-80 MCG/ACT AERO, Inhale 2 puffs into the lungs 4 (four) times daily as needed.   Azelastine HCl 137 MCG/SPRAY SOLN, PLACE 1 SPRAY INTO BOTH NOSTRILS AT BEDTIME. USE IN EACH NOSTRIL AS DIRECTED   Fluticasone-Umeclidin-Vilant (TRELEGY ELLIPTA) 200-62.5-25 MCG/ACT AEPB, Inhale 1 puff into the lungs daily.  Current Outpatient Medications (Analgesics):    traMADol (ULTRAM) 50 MG tablet, Take 1 tablet (50 mg total) by mouth every 6 (six) hours as needed.  Current Outpatient Medications (Hematological):    rivaroxaban (XARELTO) 10 MG TABS tablet, Take 1 tablet (10 mg total) by mouth daily.  Current Outpatient Medications (Other):    ALPRAZolam (XANAX) 0.5 MG tablet, Take 1 tablet (0.5 mg total) by mouth at bedtime as needed for anxiety.   Cholecalciferol 50 MCG (2000 UT) TABS, Take 1 tablet (2,000 Units total) by mouth daily.   gabapentin (NEURONTIN) 100 MG capsule, Take 2 capsules (200 mg total) by mouth at bedtime.   modafinil (PROVIGIL) 200 MG tablet, Take 1 tablet (200 mg total) by mouth daily.   Multiple Vitamin (MULTIVITAMIN WITH MINERALS) TABS  tablet, Take 1 tablet by mouth daily.   pantoprazole (PROTONIX) 40 MG tablet, TAKE 1 TABLET BY MOUTH EVERY DAY   tamsulosin (FLOMAX) 0.4 MG CAPS capsule, Take 1 capsule (0.4 mg total) by mouth daily.   Vitamin D, Ergocalciferol, (DRISDOL) 1.25 MG (50000 UNIT) CAPS capsule, TAKE 1 CAPSULE (50,000 UNITS TOTAL) BY MOUTH EVERY 7 (SEVEN) DAYS. *For reference purposes while preparing patient instructions.   Delete this med list prior to printing instructions for patient.*  Stop taking Xarelto (Rivaroxaban) on Wednesday, May  15.        On the morning of your procedure, take Aspirin 81 mg and any morning medicines NOT listed above.  You may use sips of water.  5. Plan to go home the same day, you will only stay overnight if medically necessary. 6. You MUST have a responsible adult to drive you home. 7. An adult MUST be with you the first 24 hours after you arrive home. 8. Bring a current list of your medications, and the last time and date medication taken. 9. Bring ID and current insurance cards. 10.Please wear clothes that are easy to get on and off and wear slip-on shoes.  Thank you for allowing [place] to care for you!   -- [facility]    Follow-Up: At [facility], you and your health needs are our priority.  As part of our continuing mission to provide you with exceptional heart care, we have created designated Provider Care Teams.  These Care Teams include your primary Cardiologist (physician) and Advanced Practice Providers (APPs -  Physician Assistants and Nurse Practitioners) who all work together to provide you with the care you need, when you need it.  We recommend signing up for the patient portal called "MyChart".  Sign up information is provided on this After Visit Summary.  MyChart is used to connect with patients for Virtual Visits (Telemedicine).  Patients are able to view lab/test results, encounter notes, upcoming appointments, etc.  Non-urgent messages can be sent to your provider as well.   To learn more about what you can do with MyChart, go to [email or web address].    Your next appointment:   Keep Scheduled Appointment  Provider:   Joni Reining, DNP, ANP

## 2022-10-11 LAB — BASIC METABOLIC PANEL
BUN/Creatinine Ratio: 9 (ref 9–20)
BUN: 8 mg/dL (ref 6–24)
CO2: 25 mmol/L (ref 20–29)
Calcium: 9.4 mg/dL (ref 8.7–10.2)
Chloride: 103 mmol/L (ref 96–106)
Creatinine, Ser: 0.86 mg/dL (ref 0.76–1.27)
Glucose: 77 mg/dL (ref 70–99)
Potassium: 4.5 mmol/L (ref 3.5–5.2)
Sodium: 141 mmol/L (ref 134–144)
eGFR: 101 mL/min/{1.73_m2} (ref >59)

## 2022-10-11 LAB — CBC
Hematocrit: 42.3 % (ref 37.5–51.0)
Hemoglobin: 14.1 g/dL (ref 13.0–17.7)
MCH: 29.6 pg (ref 26.6–33.0)
MCHC: 33.3 g/dL (ref 31.5–35.7)
MCV: 89 fL (ref 79–97)
Platelets: 244 10*3/uL (ref 150–450)
RBC: 4.76 x10E6/uL (ref 4.14–5.80)
RDW: 12.8 % (ref 11.6–15.4)
WBC: 9.9 10*3/uL (ref 3.4–10.8)

## 2022-10-13 ENCOUNTER — Telehealth: Payer: Self-pay

## 2022-10-13 NOTE — Telephone Encounter (Addendum)
Called patient regarding results. Left detailed message for patient regarding results. ----- Message from [name] Gross, NP sent at 10/11/2022 12:02 PM EDT ----- I have reviewed the labs. Okay to proceed with cardiac catheterization.   KL

## 2022-10-13 NOTE — Telephone Encounter (Addendum)
-  Results discussed with patient at office visit 10/10/22 with K. Lyman Bishop . Patient scheduled for Left Heart Cath on 10/17/22 ---- Message from Jodelle Gross, NP sent at 09/30/2022  1:57 PM EDT ----- I  have reviewed [name] Coronary CTA. He has some blockages in the first part of the circumflex artery.  He will need an appointment sooner than May 24th, ,(which he has with me) to discuss this and talk about a cath.   KL

## 2022-10-14 ENCOUNTER — Telehealth: Payer: Self-pay | Admitting: Cardiology

## 2022-10-14 NOTE — Telephone Encounter (Signed)
Patient is calling because we filled out FMLA forms. Patient stated that they were not filled out correctly and is requesting a call back to go over what went wrong. Patient stated these forms needed to be turned in before 10/17/22. Please advise.

## 2022-10-15 NOTE — Telephone Encounter (Signed)
Patient stated information on his paperwork will need to be re-done.  Patient wants call back from K. Lawrence RN to discuss corrections needed.

## 2022-10-15 NOTE — Telephone Encounter (Signed)
Spoke with pt, he reports #4a should be no and #4b should be yes. Aware will get corrected and faxed back in for him

## 2022-10-16 ENCOUNTER — Telehealth: Payer: Self-pay | Admitting: *Deleted

## 2022-10-16 NOTE — Telephone Encounter (Signed)
Paperwork changed and faxed back to [place] and given to admin.

## 2022-10-16 NOTE — Telephone Encounter (Signed)
Cardiac Catheterization scheduled at [facility] for: Friday Oct 17, 2022 7:30 AM Arrival time [facility] Main Entrance A at: 5:30 AM  Nothing to eat after midnight prior to procedure, clear liquids until 5 AM day of procedure.  Medication instructions: -Usual morning medications can be taken with sips of water including aspirin 81 mg.  Confirmed patient has responsible adult to drive home post procedure and be with patient first 24 hours after arriving home.  Plan to go home the same day, you will only stay overnight if medically necessary.  Reviewed procedure instructions with patient.  Patient reports he is not currently taking Xarelto -has been off of it for some time.

## 2022-10-17 ENCOUNTER — Telehealth: Payer: Self-pay | Admitting: Cardiology

## 2022-10-17 ENCOUNTER — Other Ambulatory Visit: Payer: Self-pay

## 2022-10-17 ENCOUNTER — Ambulatory Visit (HOSPITAL_COMMUNITY)
Admission: RE | Admit: 2022-10-17 | Discharge: 2022-10-17 | Disposition: A | Discharge status: Home / Self Care | Payer: No Typology Code available for payment source | Attending: Internal Medicine | Admitting: Internal Medicine

## 2022-10-17 ENCOUNTER — Encounter (HOSPITAL_COMMUNITY)
Admission: RE | Disposition: A | Discharge status: Home / Self Care | Payer: Self-pay | Source: Home / Self Care | Attending: Internal Medicine

## 2022-10-17 ENCOUNTER — Other Ambulatory Visit: Payer: Self-pay | Admitting: Medical

## 2022-10-17 DIAGNOSIS — F419 Anxiety disorder, unspecified: Secondary | ICD-10-CM | POA: Insufficient documentation

## 2022-10-17 DIAGNOSIS — Z79899 Other long term (current) drug therapy: Secondary | ICD-10-CM | POA: Diagnosis not present

## 2022-10-17 DIAGNOSIS — Z8249 Family history of ischemic heart disease and other diseases of the circulatory system: Secondary | ICD-10-CM | POA: Insufficient documentation

## 2022-10-17 DIAGNOSIS — R079 Chest pain, unspecified: Secondary | ICD-10-CM | POA: Diagnosis present

## 2022-10-17 DIAGNOSIS — G4733 Obstructive sleep apnea (adult) (pediatric): Secondary | ICD-10-CM | POA: Diagnosis not present

## 2022-10-17 DIAGNOSIS — I10 Essential (primary) hypertension: Secondary | ICD-10-CM | POA: Insufficient documentation

## 2022-10-17 DIAGNOSIS — Z6841 Body Mass Index (BMI) 40.0 and over, adult: Secondary | ICD-10-CM | POA: Diagnosis not present

## 2022-10-17 DIAGNOSIS — I2510 Atherosclerotic heart disease of native coronary artery without angina pectoris: Secondary | ICD-10-CM | POA: Insufficient documentation

## 2022-10-17 DIAGNOSIS — F17290 Nicotine dependence, other tobacco product, uncomplicated: Secondary | ICD-10-CM | POA: Insufficient documentation

## 2022-10-17 DIAGNOSIS — E7800 Pure hypercholesterolemia, unspecified: Secondary | ICD-10-CM | POA: Diagnosis not present

## 2022-10-17 DIAGNOSIS — I5030 Unspecified diastolic (congestive) heart failure: Secondary | ICD-10-CM

## 2022-10-17 HISTORY — PX: LEFT HEART CATH AND CORONARY ANGIOGRAPHY: CATH118249

## 2022-10-17 SURGERY — LEFT HEART CATH AND CORONARY ANGIOGRAPHY
Anesthesia: LOCAL

## 2022-10-17 MED ORDER — HEPARIN SODIUM (PORCINE) 1000 UNIT/ML IJ SOLN
INTRAMUSCULAR | Status: DC | PRN
  Administered 2022-10-17: 5000 [IU] via INTRAVENOUS

## 2022-10-17 MED ORDER — LIDOCAINE HCL (PF) 1 % IJ SOLN
INTRAMUSCULAR | Status: DC | PRN
  Administered 2022-10-17: 2 mL

## 2022-10-17 MED ORDER — HYDRALAZINE HCL 20 MG/ML IJ SOLN: 10.0000 mg | INTRAMUSCULAR | Status: DC | PRN

## 2022-10-17 MED ORDER — SODIUM CHLORIDE 0.9% FLUSH: 3.0000 mL | INTRAVENOUS | Status: DC | PRN

## 2022-10-17 MED ORDER — SODIUM CHLORIDE 0.9% FLUSH: 3.0000 mL | Freq: Two times a day (BID) | INTRAVENOUS | Status: DC

## 2022-10-17 MED ORDER — LIDOCAINE HCL (PF) 1 % IJ SOLN
INTRAMUSCULAR | Status: AC
  Filled 2022-10-17: qty 30

## 2022-10-17 MED ORDER — MIDAZOLAM HCL 2 MG/2ML IJ SOLN
INTRAMUSCULAR | Status: AC
  Filled 2022-10-17: qty 2

## 2022-10-17 MED ORDER — MIDAZOLAM HCL 2 MG/2ML IJ SOLN
INTRAMUSCULAR | Status: DC | PRN
  Administered 2022-10-17: 1 mg via INTRAVENOUS

## 2022-10-17 MED ORDER — SODIUM CHLORIDE 0.9 % IV SOLN: 250.0000 mL | INTRAVENOUS | Status: DC | PRN

## 2022-10-17 MED ORDER — FENTANYL CITRATE (PF) 100 MCG/2ML IJ SOLN
INTRAMUSCULAR | Status: DC | PRN
  Administered 2022-10-17: 25 ug via INTRAVENOUS

## 2022-10-17 MED ORDER — LABETALOL HCL 5 MG/ML IV SOLN: 10.0000 mg | INTRAVENOUS | Status: DC | PRN

## 2022-10-17 MED ORDER — FUROSEMIDE 20 MG PO TABS
20.0000 mg | ORAL_TABLET | Freq: Two times a day (BID) | ORAL | 11 refills | Status: DC
Start: 2022-10-17 — End: 2022-10-24

## 2022-10-17 MED ORDER — ACETAMINOPHEN 325 MG PO TABS: 650.0000 mg | ORAL_TABLET | ORAL | Status: DC | PRN

## 2022-10-17 MED ORDER — HEPARIN SODIUM (PORCINE) 1000 UNIT/ML IJ SOLN
INTRAMUSCULAR | Status: AC
  Filled 2022-10-17: qty 10

## 2022-10-17 MED ORDER — HEPARIN (PORCINE) IN NACL 1000-0.9 UT/500ML-% IV SOLN
INTRAVENOUS | Status: DC | PRN
  Administered 2022-10-17 (×2): 500 mL

## 2022-10-17 MED ORDER — FENTANYL CITRATE (PF) 100 MCG/2ML IJ SOLN
INTRAMUSCULAR | Status: AC
  Filled 2022-10-17: qty 2

## 2022-10-17 MED ORDER — ASPIRIN 81 MG PO CHEW: 81.0000 mg | CHEWABLE_TABLET | Freq: Once | ORAL | Status: DC

## 2022-10-17 MED ORDER — VERAPAMIL HCL 2.5 MG/ML IV SOLN
INTRAVENOUS | Status: DC | PRN
  Administered 2022-10-17: 10 mL via INTRA_ARTERIAL

## 2022-10-17 MED ORDER — VERAPAMIL HCL 2.5 MG/ML IV SOLN
INTRAVENOUS | Status: AC
  Filled 2022-10-17: qty 2

## 2022-10-17 MED ORDER — SODIUM CHLORIDE 0.9 % WEIGHT BASED INFUSION: 1.0000 mL/kg/h | INTRAVENOUS | Status: DC

## 2022-10-17 MED ORDER — SODIUM CHLORIDE 0.9 % WEIGHT BASED INFUSION
3.0000 mL/kg/h | INTRAVENOUS | Status: AC
  Administered 2022-10-17: 3 mL/kg/h via INTRAVENOUS

## 2022-10-17 MED ORDER — IOHEXOL 350 MG/ML SOLN
INTRAVENOUS | Status: DC | PRN
  Administered 2022-10-17: 50 mL

## 2022-10-17 MED ORDER — FUROSEMIDE 20 MG PO TABS
20.0000 mg | ORAL_TABLET | Freq: Two times a day (BID) | ORAL | Status: DC
  Administered 2022-10-17: 20 mg via ORAL
  Filled 2022-10-17 (×2): qty 1

## 2022-10-17 MED ORDER — ONDANSETRON HCL 4 MG/2ML IJ SOLN: 4.0000 mg | Freq: Four times a day (QID) | INTRAMUSCULAR | Status: DC | PRN

## 2022-10-17 SURGICAL SUPPLY — 9 items
CATH INFINITI 5FR ANG PIGTAIL (CATHETERS) IMPLANT
CATH OPTITORQUE TIG 4.0 6F (CATHETERS) IMPLANT
DEVICE RAD TR BAND REGULAR (VASCULAR PRODUCTS) IMPLANT
GLIDESHEATH SLEND SS 6F .021 (SHEATH) IMPLANT
KIT HEART LEFT (KITS) ×2 IMPLANT
PACK CARDIAC CATHETERIZATION (CUSTOM PROCEDURE TRAY) ×2 IMPLANT
TRANSDUCER W/STOPCOCK (MISCELLANEOUS) ×2 IMPLANT
TUBING CIL FLEX 10 FLL-RA (TUBING) ×2 IMPLANT
WIRE EMERALD 3MM-J .035X260CM (WIRE) IMPLANT

## 2022-10-17 NOTE — Telephone Encounter (Signed)
Patient states the dates on his FMLA paperwork is incorrect. He states it has him out until Oct 20, 2022 but his next appointment is not until May 24. 2024. Patient is wanting to be on intermentant leave. He states the dates need to be extended past May 20 for all follow up appointments. The dates the FMLA is good for is 5/17-2024- 10/09/2023. He need the dates changed to say until Oct 09 2023.

## 2022-10-17 NOTE — Telephone Encounter (Signed)
New Message:     Patient would like to go over his medicine with the  nurse. Wants to make sure what medicine he is supposed to be taking.

## 2022-10-17 NOTE — Telephone Encounter (Signed)
Call patient with message .[name] KitchenMarland KitchenPt needs lasix 20mg  BID and a BMET in 1 week. Orders were placed. Patient aware that meds and lab ordered and to start asap.

## 2022-10-17 NOTE — Interval H&P Note (Signed)
History and Physical Interval Note:  10/17/2022 6:47 AM  [name]  has presented today for surgery, with the diagnosis of abnormal cta.  The various methods of treatment have been discussed with the patient and family. After consideration of risks, benefits and other options for treatment, the patient has consented to  Procedure(s): LEFT HEART CATH AND CORONARY ANGIOGRAPHY (N/A) as a surgical intervention.  The patient's history has been reviewed, patient examined, no change in status, stable for surgery.  I have reviewed the patient's chart and labs.  Questions were answered to the patient's satisfaction.     [name]

## 2022-10-17 NOTE — Telephone Encounter (Signed)
Patient is calling stating that the paperwork we turned in the dates are incorrect. We put from 10/17/22 - 10/20/22, but the patient stated it needed to be for a longer period. Patient stated that he can use the leave from 10/17/22-10/09/23. Patient did not state exactly which dates he would like [place] to put down. Patient is requesting a call back about this matter. Please advise.

## 2022-10-20 ENCOUNTER — Encounter (HOSPITAL_COMMUNITY): Payer: Self-pay | Admitting: Internal Medicine

## 2022-10-20 NOTE — Telephone Encounter (Signed)
[name] states that the paperwork may not be correct. She says it originally stated he will need once a month for doctor visits but being that he has a visit on Friday he wont be approved for the visit on Friday because that will be more than once a month. The dates and frequency of appointments will need to be changed.

## 2022-10-20 NOTE — Telephone Encounter (Signed)
Suzie Steward from HR is calling to verify if the paperwork has been updated correctly and has been resubmitted. [name] is requesting we give her a callback about this information. Please advise.

## 2022-10-21 NOTE — Progress Notes (Addendum)
Cardiology Clinic Note   Patient Name: [name] Date of Encounter: 10/24/2022  Primary Care Provider:  [name], MD Primary Cardiologist:  Olga Millers, MD  Patient Profile    57 year old with known history of chronic chest discomfort, cath in 2007 7 revealing normal coronary arteries with distal LAD intramyocardial bridge without obstruction., obesity, HTN, sedentary lifestyle,  tobacco abuse, FH of CAD with brother dying of MI, and HL. Due to recurrent chest discomfort which is also associated with diaphoresis, shortness of breath, and radiation to both right and left arms intermittently,  Other history includes OSA, non-compliance with statin.   Had abnormal cardiac CTA  on 09/29/2022 revealing ostial L Cx disease with FFR 0.75 across lesion in ostial LCX, suggesting lesion is functionally significant. He was scheduled for LHC by Dr.Thukkani revealing distal LAD 30% stenosis, ostial to proximal circumflex lesion 30% stenosed.  This was mild to struct of coronary artery disease without need of intervention.  It was however noted that his LVEDP of 26-28 was elevated and therefore he was started on p.o. diuretics.  He is here for follow-up  Past Medical History    Past Medical History:  Diagnosis Date   Anxiety    Arthritis    knees - no med   Cluster headache    Hx - resolved per patient   Depression    DVT of lower extremity (deep venous thrombosis) (HCC) 6/26-28/2013   Xarelto - resolved - history   HTN (hypertension)    currently no meds   Irritability and anger    PMH of   Kidney stone    passed stone, no surgery required.   Mild intermittent asthma without complication 11/13/2020   Rotator cuff arthropathy 2007   right   Saddle pulmonary embolus (HCC) 6/26-28/2013   post orthopedic surgery - resolved   Sleep apnea 5/15   moderate OSA-has cpap but does not use it   Past Surgical History:  Procedure Laterality Date   arthroscopic knee surgery   10/09/2011   Dr Magnus Ivan   CARDIAC CATHETERIZATION  06/02/2005   LVdysfunction; normal coronaries   COLONOSCOPY     EXTRACORPOREAL SHOCK WAVE LITHOTRIPSY Left 09/05/2022   Procedure: EXTRACORPOREAL SHOCK WAVE LITHOTRIPSY (ESWL);  Surgeon: Jerilee Field, MD;  Location[facility];  Service: Urology;  Laterality: Left;   KNEE ARTHROSCOPY WITH MEDIAL MENISECTOMY Left 09/20/2014   medial menisectomy chondroplastytella medial plica excision  ;  Surgeon: Jodi Geralds, MD;  Location: [facility];  Service: Orthopedics;  Laterality: Left;   LEFT HEART CATH AND CORONARY ANGIOGRAPHY N/A 10/17/2022   Procedure: LEFT HEART CATH AND CORONARY ANGIOGRAPHY;  Surgeon: [name], MD;  Location: [facility];  Service: Cardiovascular;  Laterality: N/A;   ROTATOR CUFF REPAIR  06/02/2005   right   UPPER GASTROINTESTINAL ENDOSCOPY  06/02/2005   normal   WISDOM TOOTH EXTRACTION      Allergies  No Known Allergies  History of Present Illness    [name] comes today feeling some better after cardiac catheterization.  He denies any recurrence of severe chest pain as he was experiencing in the past.  He does complain of generalized weakness.  States that his muscle strength has felt less since starting on the diuretic and the statin therapy.  He is changing his diet and is eating more salads, and trying to avoid fried foods.  He is motivated to change his lifestyle.  He has found that  the antianxiety medication has been very helpful to him.  He denies any other new symptoms.  Home Medications    Current Outpatient Medications  Medication Sig Dispense Refill   ALPRAZolam (XANAX) 0.5 MG tablet Take 1 tablet (0.5 mg total) by mouth at bedtime as needed for anxiety. 7 tablet 0   Azelastine HCl 137 MCG/SPRAY SOLN PLACE 1 SPRAY INTO BOTH NOSTRILS AT BEDTIME. USE IN EACH NOSTRIL AS DIRECTED 90 mL 3   Fluticasone-Umeclidin-Vilant (TRELEGY ELLIPTA) 200-62.5-25 MCG/ACT AEPB  Inhale 1 puff into the lungs daily. 120 each 0   furosemide (LASIX) 20 MG tablet Take 20 mg by mouth. Take 1 Tablet Daily. ( Can Take Additional Tablet As Needed).     Multiple Vitamin (MULTIVITAMIN WITH MINERALS) TABS tablet Take 1 tablet by mouth daily.     nitroGLYCERIN (NITROSTAT) 0.3 MG SL tablet Place 1 tablet (0.3 mg total) under the tongue 2 (two) times daily as needed. 90 tablet 0   rosuvastatin (CRESTOR) 10 MG tablet Take 1 tablet (10 mg total) by mouth daily. 90 tablet 1   Albuterol-Budesonide (AIRSUPRA) 90-80 MCG/ACT AERO Inhale 2 puffs into the lungs 4 (four) times daily as needed. (Patient not taking: Reported on 10/24/2022) 32.1 g 1   Cholecalciferol 50 MCG (2000 UT) TABS Take 1 tablet (2,000 Units total) by mouth daily. (Patient not taking: Reported on 10/24/2022) 90 tablet 1   gabapentin (NEURONTIN) 100 MG capsule Take 2 capsules (200 mg total) by mouth at bedtime. (Patient not taking: Reported on 10/24/2022) 30 capsule 3   indapamide (LOZOL) 1.25 MG tablet Take 1 tablet (1.25 mg total) by mouth daily. (Patient not taking: Reported on 10/24/2022) 90 tablet 0   metoprolol tartrate (LOPRESSOR) 100 MG tablet Take 1 tablet (100 mg total) by mouth once for 1 dose. Take one tablet by mouth 2 hours before procedure. 1 tablet 0   modafinil (PROVIGIL) 200 MG tablet Take 1 tablet (200 mg total) by mouth daily. (Patient not taking: Reported on 10/24/2022) 90 tablet 0   pantoprazole (PROTONIX) 40 MG tablet TAKE 1 TABLET BY MOUTH EVERY DAY (Patient not taking: Reported on 10/24/2022) 90 tablet 1   tamsulosin (FLOMAX) 0.4 MG CAPS capsule Take 1 capsule (0.4 mg total) by mouth daily. (Patient not taking: Reported on 10/24/2022) 30 capsule 0   traMADol (ULTRAM) 50 MG tablet Take 1 tablet (50 mg total) by mouth every 6 (six) hours as needed. (Patient not taking: Reported on 10/24/2022) 90 tablet 2   Vitamin D, Ergocalciferol, (DRISDOL) 1.25 MG (50000 UNIT) CAPS capsule TAKE 1 CAPSULE (50,000 UNITS TOTAL) BY  MOUTH EVERY 7 (SEVEN) DAYS. (Patient not taking: Reported on 10/24/2022) 12 capsule 0   No current facility-administered medications for this visit.     Family History    Family History  Problem Relation Age of Onset   Cancer Mother        Mesothelioma    Diabetes Brother    Hypertension Brother    Stroke Brother 50   Heart attack Brother 55   Diabetes Maternal Aunt    Diabetes Maternal Grandmother    Heart disease Maternal Grandmother    Hypertension Maternal Grandmother    Stroke Maternal Grandmother        in 33s   Deep vein thrombosis Maternal Grandmother    Stroke Maternal Grandfather        > 55   Migraines Brother    Heart attack Brother 62   Healthy Father    Colon cancer  Neg Hx    Esophageal cancer Neg Hx    Rectal cancer Neg Hx    Stomach cancer Neg Hx    Liver cancer Neg Hx    Pancreatic cancer Neg Hx    Other Neg Hx        low testosterone   He indicated that his mother is deceased. He indicated that his father is deceased. He indicated that the status of his maternal grandmother is unknown. He indicated that the status of his maternal grandfather is unknown. He indicated that the status of his maternal aunt is unknown. He indicated that the status of his neg hx is unknown.  Social History    Social History   Socioeconomic History   Marital status: Married    Spouse name: Not on file   Number of children: 6   Years of education: 11   Highest education level: Not on file  Occupational History   Occupation: Location manager    Occupation: Lobbyist  Tobacco Use   Smoking status: Former    Types: Cigars    Quit date: 10/04/2022    Years since quitting: 0.0    Passive exposure: Never   Smokeless tobacco: Never   Tobacco comments:    every 3-4 months  Vaping Use   Vaping Use: Never used  Substance and Sexual Activity   Alcohol use: Yes    Alcohol/week: 0.0 standard drinks of alcohol    Comment: drinks occasionally   Drug use: No   Sexual  activity: Yes    Partners: Female    Birth control/protection: Condom  Other Topics Concern   Not on file  Social History Narrative   Fun: Exercise   Social Determinants of Health   Financial Resource Strain: Not on file  Food Insecurity: Not on file  Transportation Needs: Not on file  Physical Activity: Not on file  Stress: Not on file  Social Connections: Not on file  Intimate Partner Violence: Not on file     Review of Systems    General:  No chills, fever, night sweats or weight changes.  Generalized fatigue. Cardiovascular:  No chest pain, dyspnea on exertion, edema, orthopnea, palpitations, paroxysmal nocturnal dyspnea. Dermatological: No rash, lesions/masses Respiratory: No cough, dyspnea Urologic: No hematuria, dysuria Abdominal:   No nausea, vomiting, diarrhea, bright red blood per rectum, melena, or hematemesis Neurologic:  No visual changes, wkns, changes in mental status. All other systems reviewed and are otherwise negative except as noted above.     Physical Exam    VS:  BP 138/82 (BP Location: Left Arm, Patient Position: Sitting, Cuff Size: Large)   Pulse 72   Ht 5\' 7"  (1.702 m)   Wt 266 lb 6.4 oz (120.8 kg)   SpO2 99%   BMI 41.72 kg/m  , BMI Body mass index is 41.72 kg/m.     GEN: Well nourished, well developed, in no acute distress. HEENT: normal. Neck: Supple, no JVD, carotid bruits, or masses. Cardiac: RRR, no murmurs, rubs, or gallops. No clubbing, cyanosis, edema.  Radials/DP/PT 2+ and equal bilaterally.  Respiratory:  Respirations regular and unlabored, clear to auscultation bilaterally. GI: Soft, nontender, nondistended, BS + x 4. MS: no deformity or atrophy.  Cardiac catheterization insertion site is well-healed with mild ecchymosis noted lateral to the site.  No numbness or tingling in the hands, good pulses. Skin: warm and dry, no rash. Neuro:  Strength and sensation are intact. Psych: Normal affect.  Accessory Clinical Findings  ECG  personally reviewed by me today-not completed this office visit.  Lab Results  Component Value Date   WBC 9.9 10/10/2022   HGB 14.1 10/10/2022   HCT 42.3 10/10/2022   MCV 89 10/10/2022   PLT 244 10/10/2022   Lab Results  Component Value Date   CREATININE 0.86 10/10/2022   BUN 8 10/10/2022   NA 141 10/10/2022   K 4.5 10/10/2022   CL 103 10/10/2022   CO2 25 10/10/2022   Lab Results  Component Value Date   ALT 33 06/23/2022   AST 19 06/23/2022   ALKPHOS 59 06/23/2022   BILITOT 0.4 06/23/2022   Lab Results  Component Value Date   CHOL 155 12/16/2021   HDL 48.60 12/16/2021   LDLCALC 79 12/16/2021   TRIG 138.0 12/16/2021   CHOLHDL 3 12/16/2021    Lab Results  Component Value Date   HGBA1C 5.7 (H) 12/22/2019    Review of Prior Studies: [facility] 10/17/2022   Dist LAD lesion is 30% stenosed.   Ost Cx to Prox Cx lesion is 30% stenosed.   1.  Mild obstructive coronary artery disease. 2.  LVEDP of 26 to 28 mmHg; the patient will be started on p.o. diuretics.  Cardiac CTA 09/29/2022 1. Left Main: No significant stenosis   2. LAD: No significant stenosis   3. LCX: CTFFR 0.75 across lesion in ostial LCX, suggesting lesion is functionally significant   4. RCA: No significant stenosis  Assessment & Plan   1.  Coronary artery disease: Abnormal coronary artery CTA, leading to cardiac catheterization as discussed above.  Nonobstructive disease was noted.  He is being treated with secondary prevention with statin therapy, blood pressure control.  He is reassured and encouraged by the results of the catheterization.   He was found to have an elevated LVEDP during cardiac catheterization and was placed on Lasix 20 mg twice daily.  Due to some weakness since starting this medication I will decrease it to 20 mg daily, and have him take an additional dose should he feel pressure in his chest or fluid retention.  The patient verbalizes understanding.  I am going to check a BMET today for  evaluation of kidney function and potassium status.  Lifestyle management changes are recommended, and encouraged with this patient.  He is cleared from cardiac standpoint to undergo planned surgery for carpal tunnel repair.  2.  Hypertension: Much better controlled with approximately 10 pound weight loss with lifestyle changes and use of Lasix.  I will keep him on current dose of other medications but will reduce the Lasix to 20 mg daily.  If necessary we will have to adjust other medications to keep blood pressure better controlled at 130/60 as recommended.  Avoidance of salt, purposeful exercise, and low-cholesterol diet is recommended.  3.  Hypercholesterolemia: With known history of CAD, the patient has been started on statin therapy.  Of LDL less than 100.  The patient will have follow-up lipids and LFTs on next appointment, he has been counseled on avoiding fried foods and maintaining a low-cholesterol diet.  Continue rosuvastatin 10 mg daily.  He denies any myalgia pain at this time.  4.  Chronic anxiety: On last office visit the patient was placed on Xanax 0.5 mg as needed.  Cardiology will not treat anxiety however he found this medication beneficial and therefore I have advised him to follow-up with his PCP to discuss this further and need to continue this medication at their discretion.  Signed, Bettey Mare. Liborio Nixon, ANP, AACC   10/24/2022 12:40 PM      Office [phone] Fax [phone]  Notice: This dictation was prepared with Dragon dictation along with smaller phrase technology. Any transcriptional errors that result from this process are unintentional and may not be corrected upon review.

## 2022-10-21 NOTE — Telephone Encounter (Signed)
Patient made aware paperwork has been corrected and faxed back. Spoke with suzie, adjustments made to FMLA to cover the patient for 6 months.

## 2022-10-21 NOTE — Telephone Encounter (Signed)
Patient is calling to follow up. Patient needs this paperwork updated asap. Patient is requesting that we call Suzie Steward from HR back about this paperwork update because he is currently working. Suzie's phone number is [phone]. Please advise.

## 2022-10-24 ENCOUNTER — Encounter: Payer: Self-pay | Admitting: Adult Health

## 2022-10-24 ENCOUNTER — Ambulatory Visit: Payer: No Typology Code available for payment source | Attending: Adult Health | Admitting: Adult Health

## 2022-10-24 VITALS — BP 138/82 | HR 72 | Ht 67.0 in | Wt 266.4 lb

## 2022-10-24 DIAGNOSIS — I2510 Atherosclerotic heart disease of native coronary artery without angina pectoris: Secondary | ICD-10-CM

## 2022-10-24 DIAGNOSIS — I10 Essential (primary) hypertension: Secondary | ICD-10-CM | POA: Diagnosis not present

## 2022-10-24 DIAGNOSIS — F419 Anxiety disorder, unspecified: Secondary | ICD-10-CM | POA: Diagnosis not present

## 2022-10-24 DIAGNOSIS — E7800 Pure hypercholesterolemia, unspecified: Secondary | ICD-10-CM | POA: Diagnosis not present

## 2022-10-24 NOTE — Patient Instructions (Signed)
Medication Instructions:  Decrease Lasix 20 mg ( Take 1 Tablet Daily) *If you need a refill on your cardiac medications before your next appointment, please call your pharmacy*   Lab Work: BMET Today If you have labs (blood work) drawn today and your tests are completely normal, you will receive your results only by: MyChart Message (if you have MyChart) OR A paper copy in the mail If you have any lab test that is abnormal or we need to change your treatment, we will call you to review the results.   Testing/Procedures: No Testing   Follow-Up: At [facility], you and your health needs are our priority.  As part of our continuing mission to provide you with exceptional heart care, we have created designated Provider Care Teams.  These Care Teams include your primary Cardiologist (physician) and Advanced Practice Providers (APPs -  Physician Assistants and Nurse Practitioners) who all work together to provide you with the care you need, when you need it.  We recommend signing up for the patient portal called "MyChart".  Sign up information is provided on this After Visit Summary.  MyChart is used to connect with patients for Virtual Visits (Telemedicine).  Patients are able to view lab/test results, encounter notes, upcoming appointments, etc.  Non-urgent messages can be sent to your provider as well.   To learn more about what you can do with MyChart, go to [email or web address].    Your next appointment:   4-6 month(s)  Provider:   Olga Millers, MD  ( Doctor Only).

## 2022-10-25 LAB — BASIC METABOLIC PANEL WITH GFR
BUN/Creatinine Ratio: 12 (ref 9–20)
BUN: 10 mg/dL (ref 6–24)
CO2: 24 mmol/L (ref 20–29)
Calcium: 9.4 mg/dL (ref 8.7–10.2)
Chloride: 104 mmol/L (ref 96–106)
Creatinine, Ser: 0.84 mg/dL (ref 0.76–1.27)
Glucose: 79 mg/dL (ref 70–99)
Potassium: 4.4 mmol/L (ref 3.5–5.2)
Sodium: 143 mmol/L (ref 134–144)
eGFR: 102 mL/min/1.73

## 2022-10-30 ENCOUNTER — Telehealth: Payer: Self-pay | Admitting: Cardiology

## 2022-10-30 NOTE — Telephone Encounter (Signed)
Returned call to patient who states that he needs his clearance information for surgeries sent to his Urologist and his Ortho for his procedures. Patient states that his clearance was supposed to include both urology for his kidney procedure as well as his carpal tunnel- last OV note mentions Carpal tunnel repair procedure but advised patient that I would forward message to Joni Reining DNP to see if Urology procedure can be added into clearance and faxed over. Patient verbalized understanding.

## 2022-10-30 NOTE — Telephone Encounter (Signed)
Geramy called back in returning [place] call.   [phone]

## 2022-10-30 NOTE — Telephone Encounter (Signed)
Attempted to call patient, left message for patient to call back to office.   

## 2022-10-30 NOTE — Telephone Encounter (Signed)
Patient called to talk with Dr. Jens Som or nurse in regards to a previous conversation about patients two surgeries.

## 2022-10-31 NOTE — Telephone Encounter (Signed)
Left a detailed message for pt. Pt was notified that he has been cleared for his neurological procedure as well.

## 2022-11-05 ENCOUNTER — Other Ambulatory Visit: Payer: Self-pay | Admitting: Urology

## 2022-11-05 ENCOUNTER — Telehealth: Payer: Self-pay | Admitting: Cardiology

## 2022-11-05 NOTE — Progress Notes (Unsigned)
[name] [address] [place] [address] Phone: [phone] Subjective:   [name], am serving as a scribe for Dr. Antoine Primas.  I'm seeing this patient by the request  of:  [name], MD  CC: Bilateral knee pain   BJY:[id number]  04/17/2022 Facet arthropathy noted.  Discussed icing regimen and home exercises, which activities to do and which ones to avoid.  We will repeat the facet injections the patient has just responded to previously.  We will try to get the approval which patient has had difficulty in the past.  Follow-up again in 6 to 8 weeks after the injection     Discussed HEP  Chronic problem with worsening symptoms.  Still wants to avoid any surgical intervention.  Patient is young and would be looking at elective replacement at this moment.  Follow-up again in 6 to 8 weeks     Updated 11/06/2022 [name] is a 57 y.o. male coming in with complaint of B knee and back pain. Patient said he would like braces for both knees.   Back pain is manageable. Feels he needs to get epidural in near future but wants to push it out.        Past Medical History:  Diagnosis Date   Anxiety    Arthritis    knees - no med   Cluster headache    Hx - resolved per patient   Depression    DVT of lower extremity (deep venous thrombosis) (HCC) 6/26-28/2013   Xarelto - resolved - history   HTN (hypertension)    currently no meds   Irritability and anger    PMH of   Kidney stone    passed stone, no surgery required.   Mild intermittent asthma without complication 11/13/2020   Rotator cuff arthropathy 2007   right   Saddle pulmonary embolus (HCC) 6/26-28/2013   post orthopedic surgery - resolved   Sleep apnea 5/15   moderate OSA-has cpap but does not use it   Past Surgical History:  Procedure Laterality Date   arthroscopic knee surgery  10/09/2011   Dr Magnus Ivan   CARDIAC CATHETERIZATION  06/02/2005   LVdysfunction;  normal coronaries   COLONOSCOPY     EXTRACORPOREAL SHOCK WAVE LITHOTRIPSY Left 09/05/2022   Procedure: EXTRACORPOREAL SHOCK WAVE LITHOTRIPSY (ESWL);  Surgeon: Jerilee Field, MD;  Location: [facility];  Service: Urology;  Laterality: Left;   KNEE ARTHROSCOPY WITH MEDIAL MENISECTOMY Left 09/20/2014   medial menisectomy chondroplastytella medial plica excision  ;  Surgeon: Jodi Geralds, MD;  Location: [facility];  Service: Orthopedics;  Laterality: Left;   LEFT HEART CATH AND CORONARY ANGIOGRAPHY N/A 10/17/2022   Procedure: LEFT HEART CATH AND CORONARY ANGIOGRAPHY;  Surgeon: [name], MD;  Location: [facility];  Service: Cardiovascular;  Laterality: N/A;   ROTATOR CUFF REPAIR  06/02/2005   right   UPPER GASTROINTESTINAL ENDOSCOPY  06/02/2005   normal   WISDOM TOOTH EXTRACTION     Social History   Socioeconomic History   Marital status: Married    Spouse name: Not on file   Number of children: 6   Years of education: 11   Highest education level: Not on file  Occupational History   Occupation: Location manager    Occupation: Lobbyist  Tobacco Use   Smoking status: Former    Types: Cigars    Quit date: 10/04/2022    Years since  quitting: 0.0    Passive exposure: Never   Smokeless tobacco: Never   Tobacco comments:    every 3-4 months  Vaping Use   Vaping Use: Never used  Substance and Sexual Activity   Alcohol use: Yes    Alcohol/week: 0.0 standard drinks of alcohol    Comment: drinks occasionally   Drug use: No   Sexual activity: Yes    Partners: Female    Birth control/protection: Condom  Other Topics Concern   Not on file  Social History Narrative   Fun: Exercise   Social Determinants of Health   Financial Resource Strain: Not on file  Food Insecurity: Not on file  Transportation Needs: Not on file  Physical Activity: Not on file  Stress: Not on file  Social Connections: Not on file   No Known  Allergies Family History  Problem Relation Age of Onset   Cancer Mother        Mesothelioma    Diabetes Brother    Hypertension Brother    Stroke Brother 50   Heart attack Brother 55   Diabetes Maternal Aunt    Diabetes Maternal Grandmother    Heart disease Maternal Grandmother    Hypertension Maternal Grandmother    Stroke Maternal Grandmother        in 63s   Deep vein thrombosis Maternal Grandmother    Stroke Maternal Grandfather        > 55   Migraines Brother    Heart attack Brother 35   Healthy Father    Colon cancer Neg Hx    Esophageal cancer Neg Hx    Rectal cancer Neg Hx    Stomach cancer Neg Hx    Liver cancer Neg Hx    Pancreatic cancer Neg Hx    Other Neg Hx        low testosterone     Current Outpatient Medications (Cardiovascular):    furosemide (LASIX) 20 MG tablet, Take 20 mg by mouth. Take 1 Tablet Daily. ( Can Take Additional Tablet As Needed).   indapamide (LOZOL) 1.25 MG tablet, Take 1 tablet (1.25 mg total) by mouth daily.   nitroGLYCERIN (NITROSTAT) 0.3 MG SL tablet, Place 1 tablet (0.3 mg total) under the tongue 2 (two) times daily as needed.   rosuvastatin (CRESTOR) 10 MG tablet, Take 1 tablet (10 mg total) by mouth daily.   metoprolol tartrate (LOPRESSOR) 100 MG tablet, Take 1 tablet (100 mg total) by mouth once for 1 dose. Take one tablet by mouth 2 hours before procedure.  Current Outpatient Medications (Respiratory):    Albuterol-Budesonide (AIRSUPRA) 90-80 MCG/ACT AERO, Inhale 2 puffs into the lungs 4 (four) times daily as needed.   Azelastine HCl 137 MCG/SPRAY SOLN, PLACE 1 SPRAY INTO BOTH NOSTRILS AT BEDTIME. USE IN EACH NOSTRIL AS DIRECTED   Fluticasone-Umeclidin-Vilant (TRELEGY ELLIPTA) 200-62.5-25 MCG/ACT AEPB, Inhale 1 puff into the lungs daily.  Current Outpatient Medications (Analgesics):    traMADol (ULTRAM) 50 MG tablet, Take 1 tablet (50 mg total) by mouth every 6 (six) hours as needed.   Current Outpatient Medications (Other):     ALPRAZolam (XANAX) 0.5 MG tablet, Take 1 tablet (0.5 mg total) by mouth at bedtime as needed for anxiety.   Cholecalciferol 50 MCG (2000 UT) TABS, Take 1 tablet (2,000 Units total) by mouth daily.   gabapentin (NEURONTIN) 100 MG capsule, Take 2 capsules (200 mg total) by mouth at bedtime.   modafinil (PROVIGIL) 200 MG tablet, Take 1 tablet (200 mg total) by  mouth daily.   Multiple Vitamin (MULTIVITAMIN WITH MINERALS) TABS tablet, Take 1 tablet by mouth daily.   pantoprazole (PROTONIX) 40 MG tablet, TAKE 1 TABLET BY MOUTH EVERY DAY   tamsulosin (FLOMAX) 0.4 MG CAPS capsule, Take 1 capsule (0.4 mg total) by mouth daily.   Vitamin D, Ergocalciferol, (DRISDOL) 1.25 MG (50000 UNIT) CAPS capsule, TAKE 1 CAPSULE (50,000 UNITS TOTAL) BY MOUTH EVERY 7 (SEVEN) DAYS.   Reviewed prior external information including notes and imaging from  primary care provider As well as notes that were available from care everywhere and other healthcare systems.  Past medical history, social, surgical and family history all reviewed in electronic medical record.  No pertanent information unless stated regarding to the chief complaint.   Review of Systems:  No headache, visual changes, nausea, vomiting, diarrhea, constipation, dizziness, abdominal pain, skin rash, fevers, chills, night sweats, weight loss, swollen lymph nodes, body aches, joint swelling, chest pain, shortness of breath, mood changes. POSITIVE muscle aches  Objective  Blood pressure (!) 138/94, pulse 88, height 5\' 7"  (1.702 m), weight 271 lb (122.9 kg), SpO2 94 %.   General: No apparent distress alert and oriented x3 mood and affect normal, dressed appropriately.  HEENT: Pupils equal, extraocular movements intact  Respiratory: Patient's speak in full sentences and does not appear short of breath  Cardiovascular: No lower extremity edema, non tender, no erythema  Bilateral knee exam shows crepitus and lateral tracking of the patella  Instability  with valgus and varus  After informed written and verbal consent, patient was seated on exam table. Right knee was prepped with alcohol swab and utilizing anterolateral approach, patient's right knee space was injected with 4:1  marcaine 0.5%: Kenalog 40mg /dL. Patient tolerated the procedure well without immediate complications.  After informed written and verbal consent, patient was seated on exam table. Left knee was prepped with alcohol swab and utilizing anterolateral approach, patient's left knee space was injected with 4:1  marcaine 0.5%: Kenalog 40mg /dL. Patient tolerated the procedure well without immediate complications.    Impression and Recommendations:      The above documentation has been reviewed and is accurate and complete Judi Saa, DO

## 2022-11-05 NOTE — Telephone Encounter (Signed)
   Pre-operative Risk Assessment    Patient Name: SAHAJ RAJ  DOB: Sep 30, 1965 MRN: [id number]      Request for Surgical Clearance    Procedure:   Shock Wave Lithotripsy  Date of Surgery:  Clearance 11/21/22                                 Surgeon:  Dr. Jerilee Field Surgeon's Group or Practice Name:  [facility] Phone number:  [phone] Fax number:  [phone]   Type of Clearance Requested:   - Medical    Type of Anesthesia:  Local    Additional requests/questions:  Please advise surgeon/provider what medications should be held.  Signed, Belisicia T Woods   11/05/2022, 4:06 PM

## 2022-11-06 ENCOUNTER — Ambulatory Visit (INDEPENDENT_AMBULATORY_CARE_PROVIDER_SITE_OTHER): Payer: No Typology Code available for payment source | Admitting: Family Medicine

## 2022-11-06 VITALS — BP 138/94 | HR 88 | Ht 67.0 in | Wt 271.0 lb

## 2022-11-06 DIAGNOSIS — M170 Bilateral primary osteoarthritis of knee: Secondary | ICD-10-CM

## 2022-11-06 NOTE — Assessment & Plan Note (Signed)
Chronic problem with worsening symptoms.  Patient would have to lose weight and get BMI under 40 to be a surgical candidate.  Discussed with patient about icing regimen and home exercises, discussed which activities to do and which ones to avoid.  Patient will continue with conservative therapy at this point.  Did not feel that viscosupplementation has made any significant improvement.  Follow-up with me again in 6 to 8 weeks

## 2022-11-06 NOTE — Telephone Encounter (Signed)
   Patient Name: [name]  DOB: 03-Jun-1965 MRN: [id number]  Primary Cardiologist: Olga Millers, MD  Chart reviewed as part of pre-operative protocol coverage. Pre-op clearance already addressed by colleagues in earlier phone notes. To summarize recommendations:  -Yes, he is fine to proceed.Normal stress test.  -Joni Reining, NP  Will route this bundled recommendation to requesting provider via Epic fax function and remove from pre-op pool. Please call with questions.  Sharlene Dory, PA-C 11/06/2022, 4:18 PM

## 2022-11-06 NOTE — Patient Instructions (Addendum)
Injected both knees today Hinged knee brace for both knees Let me know when you want another epidural See me again in 3 months

## 2022-11-07 ENCOUNTER — Telehealth: Payer: Self-pay | Admitting: Family Medicine

## 2022-11-07 NOTE — Telephone Encounter (Signed)
Patient called stating that he thinks the knee braces he was given yesterday are too small. He asked if he could exchange them for an extra large?  Advised that you were out of the office until Tuesday.

## 2022-11-11 NOTE — Telephone Encounter (Signed)
Left patient message to call back.

## 2022-11-17 ENCOUNTER — Encounter (HOSPITAL_BASED_OUTPATIENT_CLINIC_OR_DEPARTMENT_OTHER): Payer: Self-pay | Admitting: Urology

## 2022-11-17 NOTE — Progress Notes (Signed)
Pre-op phone call complete. Procedure date and arrival time confirmed. Patient allergies, medical history, and medications verified. Patient advised not to take lasix DOS and to be NPO at midnight. Driver secured.

## 2022-11-20 NOTE — H&P (Signed)
Office Visit Report     11/17/2022   --------------------------------------------------------------------------------   [name]  MRN: [id number]  DOB: 05/27/1966, 57 year old Male  SSN: [id number]   PRIMARY CARE:  [name], MD  PRIMARY CARE FAX:  [phone]  REFERRING:  [name], MD  PROVIDER:  Karoline Caldwell, M.D.  TREATING:  Bartholomew Crews, NP  LOCATION:  [facility] [phone]     --------------------------------------------------------------------------------   CC/HPI: Patient is a 57 year old African-American male seen today for 3 urologic issues: First week was evaluated by PCP for some unexplained abdominal discomfort approximately 1 month ago. Had a CT scan which shows a 6 mm nonobstructing left renal calculus in the lower calyx and some bilateral nonobstructing small simple cysts. Has had no significant nausea vomiting flank pain or hematuria. No prior history of stones. He was interested in evaluating treating left renal calculus.  Second issue is some unexplained right-sided testicular pain which has been intermittent over the last few months. No history of GU trauma. No swelling. Currently asymptomatic.  Third issue is that of erectile dysfunction. He has had decreased rigidity of erections over the last 6 to 12 months. Denies any pain or curvature with erections. Never tried any PDE 5 inhibitors    CLINICAL DATA: Mid abdominal pain.   EXAM:  CT ABDOMEN AND PELVIS WITH CONTRAST   TECHNIQUE:  Multidetector CT imaging of the abdomen and pelvis was performed  using the standard protocol following bolus administration of  intravenous contrast.   RADIATION DOSE REDUCTION: This exam was performed according to the  departmental dose-optimization program which includes automated  exposure control, adjustment of the mA and/or kV according to  patient size and/or use of iterative reconstruction technique.   CONTRAST: ISOVUE-300  IOPAMIDOL (ISOVUE-300) INJECTION 61%   COMPARISON: January 02, 2021, September 07, 2017   FINDINGS:  Lower chest: No acute abnormality.   Hepatobiliary: No focal liver abnormality is seen. No gallstones,  gallbladder wall thickening, or biliary dilatation.   Pancreas: Unremarkable. No pancreatic ductal dilatation or  surrounding inflammatory changes.   Spleen: Normal in size without focal abnormality.   Adrenals/Urinary Tract: There is a stable 1.0 cm diameter  low-attenuation right adrenal mass (approximately 49.58 Hounsfield  units). The left adrenal gland is unremarkable. Kidneys are normal  in size, without obstructing renal calculi or hydronephrosis. 3.8 cm  and 1.9 cm diameter simple cysts are seen within the right kidney. A  0.5 cm simple cyst is also seen within the upper pole of the left  kidney. A 6 mm nonobstructing renal calculus is seen within the mid  to lower left kidney. Bladder is unremarkable.   Stomach/Bowel: Stomach is within normal limits. Appendix appears  normal. No evidence of bowel wall thickening, distention, or  inflammatory changes. Noninflamed diverticula are seen within the  distal descending and proximal sigmoid colon.   Vascular/Lymphatic: Aortic atherosclerosis. No enlarged abdominal or  pelvic lymph nodes.   Reproductive: Prostate is unremarkable.   Other: A 3.8 cm x 2.1 cm x 3.1 cm fat containing left-sided para  umbilical hernia is seen. No abdominopelvic ascites.   Musculoskeletal: No acute or significant osseous findings.   IMPRESSION:  1. Colonic diverticulosis.  2. Stable small right adrenal adenoma. No follow-up imaging is  recommended. This recommendation follows ACR consensus guidelines:  Management of Incidental Adrenal Masses: A White Paper of the ACR  Incidental Findings Committee. J Am Coll Radiol 2017;14:1038-1044.  3. 6 mm nonobstructing  left renal calculus.  4. Bilateral simple renal cysts. No follow-up imaging is  recommended.  This recommendation follows ACR consensus guidelines:  Management of the Incidental Renal Mass on CT: A White Paper of the  ACR Incidental Findings Committee. J Am Coll Radiol [phone].  5. Aortic atherosclerosis.   Aortic Atherosclerosis (ICD10-I70.0).    Electronically Signed  By: Aram Candela M.D.  On: 07/31/2022 01:13   11/17/2022: [name] is a 57 year old man who presents for preoperative appointment prior to undergoing left ESWL. He has been cleared by cardiology. He denies passage of stone material, gross hematuria, flank pain, fevers, chills.     ALLERGIES: No Allergies    MEDICATIONS: Multivitamin  Tadalafil 20 mg tablet 1 tablet PO Daily PRN  Turmeric  Vitamin D3     GU PSH: No GU PSH    NON-GU PSH: Dental Surgery Procedure Knee Arthroscopy/surgery, Left Shoulder Arthroscopy/surgery, Right     GU PMH: ED due to arterial insufficiency (Stable) - 08/22/2022, Erectile dysfunction due to arterial insufficiency, - 2014 Renal calculus - 08/22/2022 Right testicular pain - 08/22/2022 Varicocele - 08/22/2022 History of urolithiasis, Nephrolithiasis - 2014 Nocturia, Nocturia - 2014 Polyuria, Polyuria - 2014      PMH Notes:  2012-06-14 13:55:02 - Note: Thrombophlebitis Of Deep Vessels Of The Lower Extremity  2007-02-22 13:12:37 - Note: Arthritis   NON-GU PMH: Personal history of other diseases of the circulatory system, History of hypertension - 2014 Psychogenic ED, Impotence, psychogenic - 2014 Arthritis Asthma DVT, History Encounter for general adult medical examination without abnormal findings, Encounter for preventive health examination Pulmonary Embolism, History Sleep Apnea    FAMILY HISTORY: Blood In Urine - Runs In Family Diabetes - Brother Heart Disease - Brother Hypertension - Brother Lung Cancer - Brother   SOCIAL HISTORY: Marital Status: Married Ethnicity: Not Hispanic Or Latino; Race: Black or African American Current Smoking  Status: Patient smokes occasionally.   Tobacco Use Assessment Completed: Used Tobacco in last 30 days? Does not use smokeless tobacco. Does drink.  Does not use drugs. Drinks 1 caffeinated drink per day.     Notes: Never A Smoker, Alcohol Use, Marital History - Single, Tobacco Use, Caffeine Use   REVIEW OF SYSTEMS:    GU Review Male:   Patient denies frequent urination, hard to postpone urination, burning/ pain with urination, get up at night to urinate, leakage of urine, stream starts and stops, trouble starting your stream, have to strain to urinate , erection problems, and penile pain.  Gastrointestinal (Upper):   Patient denies nausea, vomiting, and indigestion/ heartburn.  Gastrointestinal (Lower):   Patient denies diarrhea and constipation.  Constitutional:   Patient denies fever, night sweats, weight loss, and fatigue.  Eyes:   Patient denies blurred vision and double vision.  Cardiovascular:   Patient denies leg swelling and chest pains.  Respiratory:   Patient denies cough and shortness of breath.  Musculoskeletal:   Patient denies back pain and joint pain.  Neurological:   Patient denies headaches and dizziness.  Psychologic:   Patient denies depression and anxiety.   VITAL SIGNS: None   MULTI-SYSTEM PHYSICAL EXAMINATION:    Constitutional: Well-nourished. No physical deformities. Normally developed. Good grooming.  Cardiovascular: Normal temperature, normal extremity pulses, no swelling, no varicosities.  Skin: No paleness, no jaundice, no cyanosis. No lesion, no ulcer, no rash.  Neurologic / Psychiatric: Oriented to time, oriented to place, oriented to person. No depression, no anxiety, no agitation.  Gastrointestinal: No mass, no tenderness, no rigidity, non  obese abdomen.     Complexity of Data:  Source Of History:  Patient  Records Review:   Previous Doctor Records, Previous Patient Records  Urine Test Review:   Urinalysis   02/23/07  Hormones  Testosterone, Total  2.79     11/17/22  Urinalysis  Urine Appearance Clear   Urine Color Yellow   Urine Glucose Neg mg/dL  Urine Bilirubin Neg mg/dL  Urine Ketones Neg mg/dL  Urine Specific Gravity 1.020   Urine Blood Neg ery/uL  Urine pH <=5.0   Urine Protein Neg mg/dL  Urine Urobilinogen 0.2 mg/dL  Urine Nitrites Neg   Urine Leukocyte Esterase Neg leu/uL   PROCEDURES:          Urinalysis - 81003 Dipstick Dipstick Cont'd  Color: Yellow Bilirubin: Neg  Appearance: Clear Ketones: Neg  Specific Gravity: 1.020 Blood: Neg  pH: <=5.0 Protein: Neg  Glucose: Neg Urobilinogen: 0.2    Nitrites: Neg    Leukocyte Esterase: Neg    Notes:      ASSESSMENT:      ICD-10 Details  1 GU:   Renal calculus - N20.0 Left, Chronic, Stable   PLAN:           Schedule Return Visit/Planned Activity: Keep Scheduled Appointment          Document Letter(s):  Created for Patient: Clinical Summary         Notes:   All of his questions regarding left ESWL were answered to the best of my ability. He will keep his appointment as scheduled on 11/21/2022. Advised return precautions in the interval. He voiced his understanding.        Next Appointment:      Next Appointment: 11/21/2022 07:30 AM    Appointment Type: Surgery     Location: [facility] [phone]    Provider: Jerilee Field, M.D.    Reason for Visit: NE/OP LEFT ESWL      * Signed by Bartholomew Crews, NP on 11/17/22 at 5:19 PM (EDT)*      The information contained in this medical record document is considered private and confidential patient information. This information can only be used for the medical diagnosis and/or medical services that are being provided by the patient's selected caregivers. This information can only be distributed outside of the patient's care if the patient agrees and signs waivers of authorization for this information to be sent to an outside source or route.

## 2022-11-21 ENCOUNTER — Encounter (HOSPITAL_BASED_OUTPATIENT_CLINIC_OR_DEPARTMENT_OTHER): Payer: Self-pay | Admitting: Urology

## 2022-11-21 ENCOUNTER — Ambulatory Visit (HOSPITAL_BASED_OUTPATIENT_CLINIC_OR_DEPARTMENT_OTHER)
Admission: RE | Admit: 2022-11-21 | Discharge: 2022-11-21 | Disposition: A | Discharge status: Home / Self Care | Payer: No Typology Code available for payment source | Attending: Urology | Admitting: Urology

## 2022-11-21 ENCOUNTER — Other Ambulatory Visit: Payer: Self-pay

## 2022-11-21 ENCOUNTER — Ambulatory Visit (HOSPITAL_COMMUNITY): Payer: No Typology Code available for payment source

## 2022-11-21 ENCOUNTER — Encounter (HOSPITAL_BASED_OUTPATIENT_CLINIC_OR_DEPARTMENT_OTHER)
Admission: RE | Disposition: A | Discharge status: Home / Self Care | Payer: Self-pay | Source: Home / Self Care | Attending: Urology

## 2022-11-21 DIAGNOSIS — F17200 Nicotine dependence, unspecified, uncomplicated: Secondary | ICD-10-CM | POA: Insufficient documentation

## 2022-11-21 DIAGNOSIS — Z8249 Family history of ischemic heart disease and other diseases of the circulatory system: Secondary | ICD-10-CM | POA: Insufficient documentation

## 2022-11-21 DIAGNOSIS — Z86718 Personal history of other venous thrombosis and embolism: Secondary | ICD-10-CM | POA: Diagnosis not present

## 2022-11-21 DIAGNOSIS — I10 Essential (primary) hypertension: Secondary | ICD-10-CM | POA: Diagnosis not present

## 2022-11-21 DIAGNOSIS — E669 Obesity, unspecified: Secondary | ICD-10-CM | POA: Diagnosis not present

## 2022-11-21 DIAGNOSIS — N529 Male erectile dysfunction, unspecified: Secondary | ICD-10-CM | POA: Insufficient documentation

## 2022-11-21 DIAGNOSIS — Z6841 Body Mass Index (BMI) 40.0 and over, adult: Secondary | ICD-10-CM | POA: Insufficient documentation

## 2022-11-21 DIAGNOSIS — G4730 Sleep apnea, unspecified: Secondary | ICD-10-CM | POA: Insufficient documentation

## 2022-11-21 DIAGNOSIS — Z86711 Personal history of pulmonary embolism: Secondary | ICD-10-CM | POA: Insufficient documentation

## 2022-11-21 DIAGNOSIS — N200 Calculus of kidney: Secondary | ICD-10-CM | POA: Diagnosis present

## 2022-11-21 DIAGNOSIS — J45909 Unspecified asthma, uncomplicated: Secondary | ICD-10-CM | POA: Insufficient documentation

## 2022-11-21 HISTORY — PX: EXTRACORPOREAL SHOCK WAVE LITHOTRIPSY: SHX1557

## 2022-11-21 SURGERY — LITHOTRIPSY, ESWL
Anesthesia: LOCAL | Laterality: Left

## 2022-11-21 MED ORDER — SODIUM CHLORIDE 0.9 % IV SOLN: INTRAVENOUS | Status: DC

## 2022-11-21 MED ORDER — HYDROCODONE-ACETAMINOPHEN 7.5-325 MG PO TABS: 1.0000 | ORAL_TABLET | Freq: Four times a day (QID) | ORAL | 0 refills | Status: DC | PRN

## 2022-11-21 MED ORDER — CIPROFLOXACIN HCL 500 MG PO TABS
500.0000 mg | ORAL_TABLET | ORAL | Status: AC
  Administered 2022-11-21: 500 mg via ORAL

## 2022-11-21 MED ORDER — DIAZEPAM 5 MG PO TABS
ORAL_TABLET | ORAL | Status: AC
  Filled 2022-11-21: qty 2

## 2022-11-21 MED ORDER — DIAZEPAM 5 MG PO TABS
10.0000 mg | ORAL_TABLET | ORAL | Status: AC
  Administered 2022-11-21: 10 mg via ORAL

## 2022-11-21 MED ORDER — DIPHENHYDRAMINE HCL 25 MG PO CAPS
25.0000 mg | ORAL_CAPSULE | ORAL | Status: AC
  Administered 2022-11-21: 25 mg via ORAL

## 2022-11-21 MED ORDER — DIPHENHYDRAMINE HCL 25 MG PO CAPS
ORAL_CAPSULE | ORAL | Status: AC
  Filled 2022-11-21: qty 1

## 2022-11-21 MED ORDER — CIPROFLOXACIN HCL 500 MG PO TABS
ORAL_TABLET | ORAL | Status: AC
  Filled 2022-11-21: qty 1

## 2022-11-21 NOTE — Op Note (Signed)
Left 8 mm lower pole stone  Left ESWL  Findings: stone faded. He may need a staged procedure should he fail to pass the stone or stone fragments.   See [facility] full op note.

## 2022-11-21 NOTE — Interval H&P Note (Signed)
History and Physical Interval Note:  11/21/2022 7:50 AM  Otherwise he is well with no cough, cold or congestion. No dysuria or gross hematuria.    Jerilee Field

## 2022-11-21 NOTE — Interval H&P Note (Signed)
History and Physical Interval Note:  11/21/2022 7:49 AM  [name]  has presented today for surgery, with the diagnosis of LEFT RENAL CALCULUS.  The various methods of treatment have been discussed with the patient and family. After consideration of risks, benefits and other options for treatment, the patient has consented to  Procedure(s): EXTRACORPOREAL SHOCK WAVE LITHOTRIPSY (ESWL) (Left) as a surgical intervention.  The patient's history has been reviewed, patient examined, no change in status, stable for surgery.  I have reviewed the patient's chart and labs. Discussed the stone may or may not be causing any pain and this treatment is more elective. Discussed risks of bleeding and hematoma among others.  Questions were answered to the patient's satisfaction.  He elects to proceed.    Jerilee Field

## 2022-11-24 ENCOUNTER — Encounter (HOSPITAL_BASED_OUTPATIENT_CLINIC_OR_DEPARTMENT_OTHER): Payer: Self-pay | Admitting: Urology

## 2022-12-03 ENCOUNTER — Other Ambulatory Visit: Payer: Self-pay

## 2022-12-03 ENCOUNTER — Telehealth: Payer: Self-pay | Admitting: Family Medicine

## 2022-12-03 DIAGNOSIS — M47816 Spondylosis without myelopathy or radiculopathy, lumbar region: Secondary | ICD-10-CM

## 2022-12-03 NOTE — Telephone Encounter (Signed)
Epidural ordered.  

## 2022-12-03 NOTE — Telephone Encounter (Signed)
Pt would like [place] to order the facet injections for his back again at [facility]. York Spaniel this was briefly discussed at last visit and he is ready to repeat.

## 2022-12-08 NOTE — Telephone Encounter (Signed)
Pt notified via V/M

## 2022-12-18 NOTE — Discharge Instructions (Signed)

## 2022-12-19 ENCOUNTER — Ambulatory Visit
Admission: RE | Admit: 2022-12-19 | Discharge: 2022-12-19 | Disposition: A | Discharge status: Home / Self Care | Payer: No Typology Code available for payment source | Source: Ambulatory Visit | Attending: Family Medicine | Admitting: Family Medicine

## 2022-12-19 DIAGNOSIS — M47816 Spondylosis without myelopathy or radiculopathy, lumbar region: Secondary | ICD-10-CM

## 2022-12-19 MED ORDER — METHYLPREDNISOLONE ACETATE 40 MG/ML INJ SUSP (RADIOLOG
80.0000 mg | Freq: Once | INTRAMUSCULAR | Status: AC
  Administered 2022-12-19: 80 mg via INTRA_ARTICULAR

## 2022-12-19 MED ORDER — IOPAMIDOL (ISOVUE-M 200) INJECTION 41%
1.0000 mL | Freq: Once | INTRAMUSCULAR | Status: AC
  Administered 2022-12-19: 1 mL via INTRA_ARTICULAR

## 2023-01-01 ENCOUNTER — Ambulatory Visit (INDEPENDENT_AMBULATORY_CARE_PROVIDER_SITE_OTHER): Payer: No Typology Code available for payment source | Admitting: Nurse Practitioner

## 2023-01-01 ENCOUNTER — Encounter: Payer: Self-pay | Admitting: Nurse Practitioner

## 2023-01-01 VITALS — BP 130/80 | HR 87 | Ht 67.0 in | Wt 274.6 lb

## 2023-01-01 DIAGNOSIS — J4520 Mild intermittent asthma, uncomplicated: Secondary | ICD-10-CM

## 2023-01-01 DIAGNOSIS — G4733 Obstructive sleep apnea (adult) (pediatric): Secondary | ICD-10-CM

## 2023-01-01 NOTE — Assessment & Plan Note (Signed)
Moderate OSA on CPAP. Excellent control and compliance. Wearing full face mask. Discussed changing regularly to avoid leakage. He will notify us if problem persists with new mask. Receives benefit from use. Orders placed for new CPAP machin 12-15 cmH2O, mask of choice and heated humidity. Aware of proper use/care of device. Aware of safe driving practices.  Patient Instructions  Continue to use CPAP every night, minimum of 4-6 hours a night.  Change equipment every 30 days or as directed by DME. Wash your tubing with warm soap and water daily, hang to dry. Wash humidifier portion weekly. Use bottled, distilled water and change daily  Be aware of reduced alertness and do not drive or operate heavy machinery if experiencing this or drowsiness.  Exercise encouraged, as tolerated. Notify if persistent daytime sleepiness occurs even with consistent use of CPAP.  Orders sent for new CPAP machine   Continue Albuterol inhaler 2 puffs every 6 hours as needed for shortness of breath or wheezing  Follow up in 12 weeks with Dr. Vassie Loll (1st) or Katie Khali Albanese,NP to see how new CPAP machine is doing. If symptoms worsen, please contact office for sooner follow up or seek emergency care.

## 2023-01-01 NOTE — Progress Notes (Signed)
@Patient  ID: [name], male    DOB: [birth year], 57 y.o.   MRN: [id number]  Chief Complaint  Patient presents with   Follow-up    F/up, no complaints.    Referring provider: [name], MD  HPI: 57 year old male, former smoker followed for OSA on CPAP and mild asthma. He is a patient of [name] and last seen in office 12/31/2021. Past medical history significant for HTN, RBBB, ED, angina, GERD, PUD, obesity, CHF, hx of saddle PE.   TEST/EVENTS:  09/2013 HST: AHI 24/h 01/2017 PFTs normal  12/31/2021: OV with [name]. Moderate OSA on CPAP. Does not feel fully rested and doesn't have as much energy. Wonders if CPAP needs to be adjusted. Doesn't always feel like he's getting enough air. Wife has not witnessed any apneas or snoring. Excellent compliance and good control on set pressure 12 cmH2O; will adjust him to auto 12-15 cmH2O and trial AirFit F30 fullface mask. His PCP had given him Trelegy to try but didn't feel like this helped. Felt as thought he didn't need this. Had him resume PRN SABA. Advised him to start asteline for allergic rhinitis and OTC nasal decongestant as needed.   01/01/2023: Today - follow up Patient presents today for follow up. He has been doing okay since he was here last. He was having difficulties with fatigue, SOB, diaphoresis, and chest discomfort that was evaluated by cardiology. He had LHC with mild CAD, without need for intervention. He was found to have LVEDP of 26-28 and therefore started on diuretics. He takes 20 mg 1-2 times daily depending on symptoms. He feels like this has helped with his symptoms. Breathing feels better. Only has to use albuterol during allergy season. No cough, chest congestion, wheezing.  Regarding his CPAP, he thinks he is due for a new machine. He's been having to tape his water chamber in so he would like to get a new one if able. He does feel like he's better rested, most nights, with the change to full face mask and auto  setting. He still has some days that he wakes up and feels more tired than others. He was also having some issues with mask leaking but changed his mask last night and slept better so he thinks this fixed the problem. No drowsy driving or morning headaches.   12/02/2022-12/31/2022: CPAP 12-15 cmH2O 28/30 days; 93% >4 hr; av use 7 hr 40 min Pressure 95th 13 Leaks 95th 17 AHI 1.6  No Known Allergies  Immunization History  Administered Date(s) Administered   Hepatitis A, Adult 08/28/2017, 01/20/2018   Influenza Whole 03/02/2012   Influenza, High Dose Seasonal PF 06/02/2017, 03/22/2019   Influenza,inj,Quad PF,6+ Mos 02/23/2019   Influenza-Unspecified 03/10/2013   PFIZER(Purple Top)SARS-COV-2 Vaccination 08/14/2019, 09/03/2019, 04/15/2020   PNEUMOCOCCAL CONJUGATE-20 11/13/2020   Tdap 06/18/2011, 08/14/2021   Zoster Recombinant(Shingrix) 08/14/2021, 12/16/2021    Past Medical History:  Diagnosis Date   Anxiety    Arthritis    knees - no med   Cluster headache    Hx - resolved per patient   Depression    DVT of lower extremity (deep venous thrombosis) (HCC) 6/26-28/2013   Xarelto - resolved - history   HTN (hypertension)    currently no meds   Irritability and anger    PMH of   Kidney stone    passed stone, no surgery required.   Mild intermittent asthma without complication 11/13/2020   Rotator cuff arthropathy 2007   right  Saddle pulmonary embolus (HCC) 6/26-28/2013   post orthopedic surgery - resolved   Sleep apnea 09/2013   moderate OSA-has cpap but does not use it    Tobacco History: Social History   Tobacco Use  Smoking Status Former   Types: Cigars   Quit date: 10/04/2022   Years since quitting: 0.2   Passive exposure: Never  Smokeless Tobacco Never  Tobacco Comments   every 3-4 months   Counseling given: Not Answered Tobacco comments: every 3-4 months   Outpatient Medications Prior to Visit  Medication Sig Dispense Refill   Albuterol-Budesonide  (AIRSUPRA) 90-80 MCG/ACT AERO Inhale 2 puffs into the lungs 4 (four) times daily as needed. 32.1 g 1   ALPRAZolam (XANAX) 0.5 MG tablet Take 1 tablet (0.5 mg total) by mouth at bedtime as needed for anxiety. 7 tablet 0   Azelastine HCl 137 MCG/SPRAY SOLN PLACE 1 SPRAY INTO BOTH NOSTRILS AT BEDTIME. USE IN EACH NOSTRIL AS DIRECTED 90 mL 3   furosemide (LASIX) 20 MG tablet Take 20 mg by mouth. Take 1 Tablet Daily. ( Can Take Additional Tablet As Needed).     HYDROcodone-acetaminophen (NORCO) 7.5-325 MG tablet Take 1 tablet by mouth every 6 (six) hours as needed for moderate pain. 10 tablet 0   Multiple Vitamin (MULTIVITAMIN WITH MINERALS) TABS tablet Take 1 tablet by mouth daily.     nitroGLYCERIN (NITROSTAT) 0.3 MG SL tablet Place 1 tablet (0.3 mg total) under the tongue 2 (two) times daily as needed. 90 tablet 0   rosuvastatin (CRESTOR) 10 MG tablet Take 1 tablet (10 mg total) by mouth daily. 90 tablet 1   Fluticasone-Umeclidin-Vilant (TRELEGY ELLIPTA) 200-62.5-25 MCG/ACT AEPB Inhale 1 puff into the lungs daily. 120 each 0   Cholecalciferol 50 MCG (2000 UT) TABS Take 1 tablet (2,000 Units total) by mouth daily. (Patient not taking: Reported on 01/01/2023) 90 tablet 1   gabapentin (NEURONTIN) 100 MG capsule Take 2 capsules (200 mg total) by mouth at bedtime. (Patient not taking: Reported on 01/01/2023) 30 capsule 3   indapamide (LOZOL) 1.25 MG tablet Take 1 tablet (1.25 mg total) by mouth daily. (Patient not taking: Reported on 01/01/2023) 90 tablet 0   metoprolol tartrate (LOPRESSOR) 100 MG tablet Take 1 tablet (100 mg total) by mouth once for 1 dose. Take one tablet by mouth 2 hours before procedure. 1 tablet 0   modafinil (PROVIGIL) 200 MG tablet Take 1 tablet (200 mg total) by mouth daily. (Patient not taking: Reported on 01/01/2023) 90 tablet 0   pantoprazole (PROTONIX) 40 MG tablet TAKE 1 TABLET BY MOUTH EVERY DAY (Patient not taking: Reported on 01/01/2023) 90 tablet 1   tamsulosin (FLOMAX) 0.4 MG CAPS  capsule Take 1 capsule (0.4 mg total) by mouth daily. (Patient not taking: Reported on 01/01/2023) 30 capsule 0   Vitamin D, Ergocalciferol, (DRISDOL) 1.25 MG (50000 UNIT) CAPS capsule TAKE 1 CAPSULE (50,000 UNITS TOTAL) BY MOUTH EVERY 7 (SEVEN) DAYS. (Patient not taking: Reported on 01/01/2023) 12 capsule 0   No facility-administered medications prior to visit.     Review of Systems:   Constitutional: No weight loss or gain, night sweats, fevers, chills, or lassitude. +occasional fatigue  HEENT: No headaches, difficulty swallowing, tooth/dental problems, or sore throat. No sneezing, itching, ear ache, nasal congestion, or post nasal drip CV:  No chest pain, orthopnea, PND, swelling in lower extremities, anasarca, dizziness, palpitations, syncope Resp: +improved shortness of breath with exertion. No excess mucus or change in color of mucus. No productive  or non-productive. No hemoptysis. No wheezing.  No chest wall deformity GI:  No heartburn, indigestion Skin: No rash, lesions, ulcerations MSK:  No joint pain or swelling.   Neuro: No dizziness or lightheadedness.  Psych: No depression or anxiety. Mood stable.     Physical Exam:  BP 130/80 (BP Location: Left Arm)   Pulse 87   Ht 5\' 7"  (1.702 m)   Wt 274 lb 9.6 oz (124.6 kg)   SpO2 95%   BMI 43.01 kg/m   GEN: Pleasant, interactive, well-kempt; morbidly obese; in no acute distress. HEENT:  Normocephalic and atraumatic. PERRLA. Sclera white. Nasal turbinates pink, moist and patent bilaterally. No rhinorrhea present. Oropharynx pink and moist, without exudate or edema. No lesions, ulcerations, or postnasal drip.  NECK:  Supple w/ fair ROM. No JVD present. Normal carotid impulses w/o bruits. Thyroid symmetrical with no goiter or nodules palpated. No lymphadenopathy.   CV: RRR, no m/r/g, no peripheral edema. Pulses intact, +2 bilaterally. No cyanosis, pallor or clubbing. PULMONARY:  Unlabored, regular breathing. Clear bilaterally A&P w/o  wheezes/rales/rhonchi. No accessory muscle use.  GI: BS present and normoactive. Soft, non-tender to palpation. No organomegaly or masses detected.  MSK: No erythema, warmth or tenderness. Cap refil <2 sec all extrem. No deformities or joint swelling noted.  Neuro: A/Ox3. No focal deficits noted.   Skin: Warm, no lesions or rashe Psych: Normal affect and behavior. Judgement and thought content appropriate.     Lab Results:  CBC    Component Value Date/Time   WBC 9.9 10/10/2022 1548   WBC 10.4 06/23/2022 1140   RBC 4.76 10/10/2022 1548   RBC 4.99 06/23/2022 1140   HGB 14.1 10/10/2022 1548   HCT 42.3 10/10/2022 1548   PLT 244 10/10/2022 1548   MCV 89 10/10/2022 1548   MCH 29.6 10/10/2022 1548   MCH 28.8 01/02/2021 1715   MCHC 33.3 10/10/2022 1548   MCHC 33.1 06/23/2022 1140   RDW 12.8 10/10/2022 1548   LYMPHSABS 2.6 06/23/2022 1140   MONOABS 1.1 (H) 06/23/2022 1140   EOSABS 0.1 06/23/2022 1140   BASOSABS 0.1 06/23/2022 1140    BMET    Component Value Date/Time   NA 143 10/24/2022 1223   K 4.4 10/24/2022 1223   CL 104 10/24/2022 1223   CO2 24 10/24/2022 1223   GLUCOSE 79 10/24/2022 1223   GLUCOSE 79 06/23/2022 1140   BUN 10 10/24/2022 1223   CREATININE 0.84 10/24/2022 1223   CREATININE 0.69 (L) 12/22/2019 1644   CALCIUM 9.4 10/24/2022 1223   GFRNONAA >60 01/02/2021 1715   GFRNONAA 108 12/22/2019 1644   GFRAA 115 09/04/2020 0000   GFRAA 125 12/22/2019 1644    BNP    Component Value Date/Time   BNP 15.2 03/08/2018 2033     Imaging:  DG FACET JT INJ L /S SINGLE LEVEL RIGHT W/FL/CT  Result Date: 12/19/2022 CLINICAL DATA:  Facet mediated low back pain with good response to multiple prior facet injections. Last injection was in January. Symptoms have recurred. EXAM: FLUOROSCOPICALLY GUIDED BILATERAL L4-L5 FACET INJECTIONS FLUOROSCOPY TIME: Radiation Exposure Index (as provided by the fluoroscopic device): 9.5 mGy Kerma PROCEDURE: The procedure, risks, benefits,  and alternatives were explained to the patient. Questions regarding the procedure were encouraged and answered. The patient understands and consents to the procedure. RIGHT L4-L5 FACET INJECTION: A posterior oblique approach was taken to the facet on the right at L4-L5 using a 5 inch 22 gauge spinal needle. Intra-articular positioning was confirmed by injecting  a small amount of Isovue-M 200. No vascular opacification is seen. 40 mg of Depo-Medrol mixed with 0.5 mL of 0.25% bupivacaine were instilled into the joint. LEFT L4-L5 FACET INJECTION: A posterior oblique approach was taken to the facet on the left at L4-L5 using a 5 inch 22 gauge spinal needle. A large posterior marginal osteophyte precludes intra-articular positioning. Juxta-articular positioning was confirmed by injecting a small amount of Isovue-M 200. No vascular opacification is seen. 40 mg of Depo-Medrol mixed with 0.5 mL of 0.25% bupivacaine were instilled. The procedure was well-tolerated. IMPRESSION: 1. Technically successful bilateral L4-L5 facet injections. Electronically Signed   By: Obie Dredge M.D.   On: 12/19/2022 12:24   DG FACET JT INJ L /S SINGLE LEVEL LEFT W/FL/CT  Result Date: 12/19/2022 CLINICAL DATA:  Facet mediated low back pain with good response to multiple prior facet injections. Last injection was in January. Symptoms have recurred. EXAM: FLUOROSCOPICALLY GUIDED BILATERAL L4-L5 FACET INJECTIONS FLUOROSCOPY TIME: Radiation Exposure Index (as provided by the fluoroscopic device): 9.5 mGy Kerma PROCEDURE: The procedure, risks, benefits, and alternatives were explained to the patient. Questions regarding the procedure were encouraged and answered. The patient understands and consents to the procedure. RIGHT L4-L5 FACET INJECTION: A posterior oblique approach was taken to the facet on the right at L4-L5 using a 5 inch 22 gauge spinal needle. Intra-articular positioning was confirmed by injecting a small amount of Isovue-M 200.  No vascular opacification is seen. 40 mg of Depo-Medrol mixed with 0.5 mL of 0.25% bupivacaine were instilled into the joint. LEFT L4-L5 FACET INJECTION: A posterior oblique approach was taken to the facet on the left at L4-L5 using a 5 inch 22 gauge spinal needle. A large posterior marginal osteophyte precludes intra-articular positioning. Juxta-articular positioning was confirmed by injecting a small amount of Isovue-M 200. No vascular opacification is seen. 40 mg of Depo-Medrol mixed with 0.5 mL of 0.25% bupivacaine were instilled. The procedure was well-tolerated. IMPRESSION: 1. Technically successful bilateral L4-L5 facet injections. Electronically Signed   By: Obie Dredge M.D.   On: 12/19/2022 12:24    Administration History     None          Latest Ref Rng & Units 01/20/2017    4:45 PM  PFT Results  FVC-Pre L 4.14   FVC-Predicted Pre % 110   FVC-Post L 4.10   FVC-Predicted Post % 109   Pre FEV1/FVC % % 90   Post FEV1/FCV % % 91   FEV1-Pre L 3.74   FEV1-Predicted Pre % 125   FEV1-Post L 3.72   DLCO uncorrected ml/min/mmHg 26.27   DLCO UNC% % 92   DLCO corrected ml/min/mmHg 24.74   DLCO COR %Predicted % 87   DLVA Predicted % 103   TLC L 5.37   TLC % Predicted % 84   RV % Predicted % 67     No results found for: "NITRICOXIDE"      Assessment & Plan:   OSA (obstructive sleep apnea) Moderate OSA on CPAP. Excellent control and compliance. Wearing full face mask. Discussed changing regularly to avoid leakage. He will notify us if problem persists with new mask. Receives benefit from use. Orders placed for new CPAP machin 12-15 cmH2O, mask of choice and heated humidity. Aware of proper use/care of device. Aware of safe driving practices.  Patient Instructions  Continue to use CPAP every night, minimum of 4-6 hours a night.  Change equipment every 30 days or as directed by DME. Wash your tubing  with warm soap and water daily, hang to dry. Wash humidifier portion weekly.  Use bottled, distilled water and change daily  Be aware of reduced alertness and do not drive or operate heavy machinery if experiencing this or drowsiness.  Exercise encouraged, as tolerated. Notify if persistent daytime sleepiness occurs even with consistent use of CPAP.  Orders sent for new CPAP machine   Continue Albuterol inhaler 2 puffs every 6 hours as needed for shortness of breath or wheezing  Follow up in 12 weeks with [name] (1st) or [name] Valissa Lyvers,NP to see how new CPAP machine is doing. If symptoms worsen, please contact office for sooner follow up or seek emergency care.    Mild intermittent asthma without complication Compensated on current regimen. Action plan in place   I spent 32 minutes of dedicated to the care of this patient on the date of this encounter to include pre-visit review of records, face-to-face time with the patient discussing conditions above, post visit ordering of testing, clinical documentation with the electronic health record, making appropriate referrals as documented, and communicating necessary findings to members of the patients care team.  Noemi Chapel, NP 01/01/2023  Pt aware and understands NP's role.

## 2023-01-01 NOTE — Patient Instructions (Addendum)
Continue to use CPAP every night, minimum of 4-6 hours a night.  Change equipment every 30 days or as directed by DME. Wash your tubing with warm soap and water daily, hang to dry. Wash humidifier portion weekly. Use bottled, distilled water and change daily  Be aware of reduced alertness and do not drive or operate heavy machinery if experiencing this or drowsiness.  Exercise encouraged, as tolerated. Notify if persistent daytime sleepiness occurs even with consistent use of CPAP.  Orders sent for new CPAP machine   Continue Albuterol inhaler 2 puffs every 6 hours as needed for shortness of breath or wheezing  Follow up in 12 weeks with Dr. Vassie Loll (1st) or Katie Orbie Grupe,NP to see how new CPAP machine is doing. If symptoms worsen, please contact office for sooner follow up or seek emergency care.

## 2023-01-01 NOTE — Assessment & Plan Note (Signed)
Compensated on current regimen. Action plan in place.  

## 2023-01-06 NOTE — Progress Notes (Unsigned)
[name] [address] [place] [address] Phone: [phone] Subjective:   [name], am serving as a scribe for Dr. Antoine Primas.  I'm seeing this patient by the request  of:  [name], MD  CC: Bilateral knee and back pain  FIE:[id number]  11/06/2022 Chronic problem with worsening symptoms.  Patient would have to lose weight and get BMI under 40 to be a surgical candidate.  Discussed with patient about icing regimen and home exercises, discussed which activities to do and which ones to avoid.  Patient will continue with conservative therapy at this point.  Did not feel that viscosupplementation has made any significant improvement.  Follow-up with me again in 6 to 8 weeks   Updated 01/08/2023 [name] is a 58 y.o. male coming in with complaint of B knee pain.  Patient seems to have increase in R knee pain. Unable to get sock on the foot. Would like to discuss surgeon recommendation for R knee. L knee is also bothering him but not to the degree of the R. Also would like referral to neurosurgeon for his back. Epidural 12/19/2022 with not much relief.        Past Medical History:  Diagnosis Date   Anxiety    Arthritis    knees - no med   Cluster headache    Hx - resolved per patient   Depression    DVT of lower extremity (deep venous thrombosis) (HCC) 6/26-28/2013   Xarelto - resolved - history   HTN (hypertension)    currently no meds   Irritability and anger    PMH of   Kidney stone    passed stone, no surgery required.   Mild intermittent asthma without complication 11/13/2020   Rotator cuff arthropathy 2007   right   Saddle pulmonary embolus (HCC) 6/26-28/2013   post orthopedic surgery - resolved   Sleep apnea 09/2013   moderate OSA-has cpap but does not use it   Past Surgical History:  Procedure Laterality Date   arthroscopic knee surgery  10/09/2011   Dr Magnus Ivan   CARDIAC CATHETERIZATION  06/02/2005    LVdysfunction; normal coronaries   COLONOSCOPY     EXTRACORPOREAL SHOCK WAVE LITHOTRIPSY Left 09/05/2022   Procedure: EXTRACORPOREAL SHOCK WAVE LITHOTRIPSY (ESWL);  Surgeon: Jerilee Field, MD;  Location: [facility];  Service: Urology;  Laterality: Left;   EXTRACORPOREAL SHOCK WAVE LITHOTRIPSY Left 11/21/2022   Procedure: EXTRACORPOREAL SHOCK WAVE LITHOTRIPSY (ESWL);  Surgeon: Jerilee Field, MD;  Location: [facility];  Service: Urology;  Laterality: Left;   KNEE ARTHROSCOPY WITH MEDIAL MENISECTOMY Left 09/20/2014   medial menisectomy chondroplastytella medial plica excision  ;  Surgeon: Jodi Geralds, MD;  Location: [facility];  Service: Orthopedics;  Laterality: Left;   LEFT HEART CATH AND CORONARY ANGIOGRAPHY N/A 10/17/2022   Procedure: LEFT HEART CATH AND CORONARY ANGIOGRAPHY;  Surgeon: [name], MD;  Location: [facility];  Service: Cardiovascular;  Laterality: N/A;   ROTATOR CUFF REPAIR  06/02/2005   right   UPPER GASTROINTESTINAL ENDOSCOPY  06/02/2005   normal   WISDOM TOOTH EXTRACTION     Social History   Socioeconomic History   Marital status: Married    Spouse name: Not on file   Number of children: 6   Years of education: 11   Highest education level: Not on file  Occupational History   Occupation: Location manager    Occupation: Musician  Lift Driver  Tobacco Use   Smoking status: Former    Types: Cigars    Quit date: 10/04/2022    Years since quitting: 0.2    Passive exposure: Never   Smokeless tobacco: Never   Tobacco comments:    every 3-4 months  Vaping Use   Vaping status: Never Used  Substance and Sexual Activity   Alcohol use: Yes    Alcohol/week: 0.0 standard drinks of alcohol    Comment: drinks occasionally   Drug use: No   Sexual activity: Yes    Partners: Female    Birth control/protection: Condom  Other Topics Concern   Not on file  Social History Narrative   Fun: Exercise   Social  Determinants of Health   Financial Resource Strain: Not on file  Food Insecurity: Not on file  Transportation Needs: Not on file  Physical Activity: Not on file  Stress: Not on file  Social Connections: Unknown (10/11/2021)   Received from [employer], [facility]   Social Network    Social Network: Not on file   No Known Allergies Family History  Problem Relation Age of Onset   Cancer Mother        Mesothelioma    Diabetes Brother    Hypertension Brother    Stroke Brother 50   Heart attack Brother 55   Diabetes Maternal Aunt    Diabetes Maternal Grandmother    Heart disease Maternal Grandmother    Hypertension Maternal Grandmother    Stroke Maternal Grandmother        in 64s   Deep vein thrombosis Maternal Grandmother    Stroke Maternal Grandfather        > 55   Migraines Brother    Heart attack Brother 69   Healthy Father    Colon cancer Neg Hx    Esophageal cancer Neg Hx    Rectal cancer Neg Hx    Stomach cancer Neg Hx    Liver cancer Neg Hx    Pancreatic cancer Neg Hx    Other Neg Hx        low testosterone     Current Outpatient Medications (Cardiovascular):    furosemide (LASIX) 20 MG tablet, Take 20 mg by mouth. Take 1 Tablet Daily. ( Can Take Additional Tablet As Needed).   nitroGLYCERIN (NITROSTAT) 0.3 MG SL tablet, Place 1 tablet (0.3 mg total) under the tongue 2 (two) times daily as needed.   rosuvastatin (CRESTOR) 10 MG tablet, Take 1 tablet (10 mg total) by mouth daily.  Current Outpatient Medications (Respiratory):    Albuterol-Budesonide (AIRSUPRA) 90-80 MCG/ACT AERO, Inhale 2 puffs into the lungs 4 (four) times daily as needed.   Azelastine HCl 137 MCG/SPRAY SOLN, PLACE 1 SPRAY INTO BOTH NOSTRILS AT BEDTIME. USE IN EACH NOSTRIL AS DIRECTED  Current Outpatient Medications (Analgesics):    HYDROcodone-acetaminophen (NORCO) 7.5-325 MG tablet, Take 1 tablet by mouth every 6 (six) hours as needed for moderate pain.   Current Outpatient  Medications (Other):    ALPRAZolam (XANAX) 0.5 MG tablet, Take 1 tablet (0.5 mg total) by mouth at bedtime as needed for anxiety.   Multiple Vitamin (MULTIVITAMIN WITH MINERALS) TABS tablet, Take 1 tablet by mouth daily.    Review of Systems:  No headache, visual changes, nausea, vomiting, diarrhea, constipation, dizziness, abdominal pain, skin rash, fevers, chills, night sweats, weight loss, swollen lymph nodes, body aches, joint swelling, chest pain, shortness of breath, mood changes. POSITIVE muscle aches  Objective  Blood pressure  122/78, pulse 93, height 5\' 7"  (1.702 m), weight 277 lb (125.6 kg), SpO2 90%.   General: No apparent distress alert and oriented x3 mood and affect normal, dressed appropriately.  HEENT: Pupils equal, extraocular movements intact  Respiratory: Patient's speak in full sentences and does not appear short of breath  Antalgic gait noted.  Patient does have significant arthritic changes noted of the knees.  Patient does have crepitus noted.  Instability noted of the right greater than left knee. Antalgic gait noted.  Back exam does have some loss lordosis noted.  Positive straight leg test with radicular referred and radicular symptoms in the L5 distribution right greater than left.  Patient does even have weakness noted with dorsi flexion of the right foot compared to left.  After informed written and verbal consent, patient was seated on exam table. Right knee was prepped with alcohol swab and utilizing anterolateral approach, patient's right knee space was injected with 4:1  marcaine 0.5%: Kenalog 40mg /dL. Patient tolerated the procedure well without immediate complications.  After informed written and verbal consent, patient was seated on exam table. Left knee was prepped with alcohol swab and utilizing anterolateral approach, patient's left knee space was injected with 4:1  marcaine 0.5%: Kenalog 40mg /dL. Patient tolerated the procedure well without immediate  complications.    Impression and Recommendations:    The above documentation has been reviewed and is accurate and complete Judi Saa, DO

## 2023-01-08 ENCOUNTER — Encounter: Payer: Self-pay | Admitting: Family Medicine

## 2023-01-08 ENCOUNTER — Ambulatory Visit (INDEPENDENT_AMBULATORY_CARE_PROVIDER_SITE_OTHER): Payer: No Typology Code available for payment source | Admitting: Family Medicine

## 2023-01-08 ENCOUNTER — Ambulatory Visit (INDEPENDENT_AMBULATORY_CARE_PROVIDER_SITE_OTHER): Payer: No Typology Code available for payment source

## 2023-01-08 VITALS — BP 122/78 | HR 93 | Ht 67.0 in | Wt 277.0 lb

## 2023-01-08 DIAGNOSIS — M25561 Pain in right knee: Secondary | ICD-10-CM | POA: Diagnosis not present

## 2023-01-08 DIAGNOSIS — M47816 Spondylosis without myelopathy or radiculopathy, lumbar region: Secondary | ICD-10-CM

## 2023-01-08 DIAGNOSIS — M170 Bilateral primary osteoarthritis of knee: Secondary | ICD-10-CM | POA: Diagnosis not present

## 2023-01-08 DIAGNOSIS — G8929 Other chronic pain: Secondary | ICD-10-CM

## 2023-01-08 NOTE — Patient Instructions (Signed)
Injection in both knees Xray today Dr. Roda Shutters office will call you MRI [phone] See me as needed

## 2023-01-08 NOTE — Assessment & Plan Note (Signed)
Chronic, with worsening symptoms.  Given injections again bilaterally secondary to the exacerbation and affecting daily activities.  Patient has been referred then to orthopedic surgery to discuss potential surgical intervention.  Patient has been working hard on his weight loss and encouraged him to continue to do so.  We discussed getting the BMI under 40 does improve healing.  Discussed with patient about icing regimen.  Patient will follow-up with me again 6 to 8 weeks.

## 2023-01-08 NOTE — Assessment & Plan Note (Signed)
dHas had significant facet arthritis but now has failed the facet injections or the radiofrequency ablation anymore at this time.  Pain is affecting daily activities and now having radicular symptoms.  Has had to call out of work previously.  Will get advanced imaging to further evaluate with the last MRI being greater than 57 years old at this time.  Depending on findings we will discuss potential referral to neurosurgery to discuss other treatment options.

## 2023-01-22 ENCOUNTER — Ambulatory Visit: Payer: No Typology Code available for payment source | Admitting: Orthopaedic Surgery

## 2023-01-23 ENCOUNTER — Encounter: Payer: Self-pay | Admitting: Family Medicine

## 2023-01-26 ENCOUNTER — Encounter: Payer: Self-pay | Admitting: Family Medicine

## 2023-01-29 NOTE — Progress Notes (Signed)
[name] [address] [place] [address] Phone: [phone] Subjective:   [name], am serving as a scribe for Dr. Antoine Primas.  I'm seeing this patient by the request  of:  [name], MD  CC: Bilateral knee and back pain follow-up  LKG:[id number]  01/08/2023 dHas had significant facet arthritis but now has failed the facet injections or the radiofrequency ablation anymore at this time.  Pain is affecting daily activities and now having radicular symptoms.  Has had to call out of work previously.  Will get advanced imaging to further evaluate with the last MRI being greater than 56 years old at this time.  Depending on findings we will discuss potential referral to neurosurgery to discuss other treatment options.   Chronic, with worsening symptoms. Given injections again bilaterally secondary to the exacerbation and affecting daily activities. Patient has been referred then to orthopedic surgery to discuss potential surgical intervention. Patient has been working hard on his weight loss and encouraged him to continue to do so. We discussed getting the BMI under 40 does improve healing. Discussed with patient about icing regimen. Patient will follow-up with me again 6 to 8 weeks.   Updated 02/05/2023 VANDELL GOULET is a 57 y.o. male coming in with complaint of B knee and back pain. Patient states that his back pain has increased recently. Facet injections in July. Lasting about 2 months.   Knee pain also increasing.        Past Medical History:  Diagnosis Date   Anxiety    Arthritis    knees - no med   Cluster headache    Hx - resolved per patient   Depression    DVT of lower extremity (deep venous thrombosis) (HCC) 6/26-28/2013   Xarelto - resolved - history   HTN (hypertension)    currently no meds   Irritability and anger    PMH of   Kidney stone    passed stone, no surgery required.   Mild intermittent asthma without  complication 11/13/2020   Rotator cuff arthropathy 2007   right   Saddle pulmonary embolus (HCC) 6/26-28/2013   post orthopedic surgery - resolved   Sleep apnea 09/2013   moderate OSA-has cpap but does not use it   Past Surgical History:  Procedure Laterality Date   arthroscopic knee surgery  10/09/2011   Dr Magnus Ivan   CARDIAC CATHETERIZATION  06/02/2005   LVdysfunction; normal coronaries   COLONOSCOPY     EXTRACORPOREAL SHOCK WAVE LITHOTRIPSY Left 09/05/2022   Procedure: EXTRACORPOREAL SHOCK WAVE LITHOTRIPSY (ESWL);  Surgeon: Jerilee Field, MD;  Location: [facility];  Service: Urology;  Laterality: Left;   EXTRACORPOREAL SHOCK WAVE LITHOTRIPSY Left 11/21/2022   Procedure: EXTRACORPOREAL SHOCK WAVE LITHOTRIPSY (ESWL);  Surgeon: Jerilee Field, MD;  Location: [facility];  Service: Urology;  Laterality: Left;   KNEE ARTHROSCOPY WITH MEDIAL MENISECTOMY Left 09/20/2014   medial menisectomy chondroplastytella medial plica excision  ;  Surgeon: Jodi Geralds, MD;  Location: [facility];  Service: Orthopedics;  Laterality: Left;   LEFT HEART CATH AND CORONARY ANGIOGRAPHY N/A 10/17/2022   Procedure: LEFT HEART CATH AND CORONARY ANGIOGRAPHY;  Surgeon: [name], MD;  Location: [facility];  Service: Cardiovascular;  Laterality: N/A;   ROTATOR CUFF REPAIR  06/02/2005   right   UPPER GASTROINTESTINAL ENDOSCOPY  06/02/2005   normal   WISDOM TOOTH EXTRACTION     Social History  Socioeconomic History   Marital status: Married    Spouse name: Not on file   Number of children: 6   Years of education: 29   Highest education level: Not on file  Occupational History   Occupation: Location manager    Occupation: Lobbyist  Tobacco Use   Smoking status: Former    Types: Cigars    Quit date: 10/04/2022    Years since quitting: 0.3    Passive exposure: Never   Smokeless tobacco: Never   Tobacco comments:    every 3-4 months   Vaping Use   Vaping status: Never Used  Substance and Sexual Activity   Alcohol use: Yes    Alcohol/week: 0.0 standard drinks of alcohol    Comment: drinks occasionally   Drug use: No   Sexual activity: Yes    Partners: Female    Birth control/protection: Condom  Other Topics Concern   Not on file  Social History Narrative   Fun: Exercise   Social Determinants of Health   Financial Resource Strain: Not on file  Food Insecurity: Not on file  Transportation Needs: Not on file  Physical Activity: Not on file  Stress: Not on file  Social Connections: Unknown (10/11/2021)   Received from [employer], [facility]   Social Network    Social Network: Not on file   No Known Allergies Family History  Problem Relation Age of Onset   Cancer Mother        Mesothelioma    Diabetes Brother    Hypertension Brother    Stroke Brother 50   Heart attack Brother 55   Diabetes Maternal Aunt    Diabetes Maternal Grandmother    Heart disease Maternal Grandmother    Hypertension Maternal Grandmother    Stroke Maternal Grandmother        in 64s   Deep vein thrombosis Maternal Grandmother    Stroke Maternal Grandfather        > 55   Migraines Brother    Heart attack Brother 59   Healthy Father    Colon cancer Neg Hx    Esophageal cancer Neg Hx    Rectal cancer Neg Hx    Stomach cancer Neg Hx    Liver cancer Neg Hx    Pancreatic cancer Neg Hx    Other Neg Hx        low testosterone     Current Outpatient Medications (Cardiovascular):    furosemide (LASIX) 20 MG tablet, Take 20 mg by mouth. Take 1 Tablet Daily. ( Can Take Additional Tablet As Needed).   nitroGLYCERIN (NITROSTAT) 0.3 MG SL tablet, Place 1 tablet (0.3 mg total) under the tongue 2 (two) times daily as needed.   rosuvastatin (CRESTOR) 10 MG tablet, Take 1 tablet (10 mg total) by mouth daily.  Current Outpatient Medications (Respiratory):    Albuterol-Budesonide (AIRSUPRA) 90-80 MCG/ACT AERO, Inhale 2 puffs into  the lungs 4 (four) times daily as needed.   Azelastine HCl 137 MCG/SPRAY SOLN, PLACE 1 SPRAY INTO BOTH NOSTRILS AT BEDTIME. USE IN EACH NOSTRIL AS DIRECTED  Current Outpatient Medications (Analgesics):    HYDROcodone-acetaminophen (NORCO) 7.5-325 MG tablet, Take 1 tablet by mouth every 6 (six) hours as needed for moderate pain.   Current Outpatient Medications (Other):    ALPRAZolam (XANAX) 0.5 MG tablet, Take 1 tablet (0.5 mg total) by mouth at bedtime as needed for anxiety.   Multiple Vitamin (MULTIVITAMIN WITH MINERALS) TABS tablet, Take 1 tablet by mouth daily.  Objective  Blood pressure 104/72, pulse 62, height 5\' 7"  (1.702 m), weight 274 lb (124.3 kg), SpO2 96%.   General: No apparent distress alert and oriented x3 mood and affect normal, dressed appropriately.  HEENT: Pupils equal, extraocular movements intact  Respiratory: Patient's speak in full sentences and does not appear short of breath  Cardiovascular: No lower extremity edema, non tender, no erythema  Patient's knees do have arthritic changes but is sitting comfortably.  Patient back seems to be worsening pain with any type of extension.    Impression and Recommendations:    The above documentation has been reviewed and is accurate and complete Judi Saa, DO

## 2023-01-30 ENCOUNTER — Ambulatory Visit (INDEPENDENT_AMBULATORY_CARE_PROVIDER_SITE_OTHER): Payer: No Typology Code available for payment source | Admitting: Orthopaedic Surgery

## 2023-01-30 ENCOUNTER — Other Ambulatory Visit (INDEPENDENT_AMBULATORY_CARE_PROVIDER_SITE_OTHER): Payer: No Typology Code available for payment source

## 2023-01-30 VITALS — Ht 67.0 in | Wt 275.0 lb

## 2023-01-30 DIAGNOSIS — M1711 Unilateral primary osteoarthritis, right knee: Secondary | ICD-10-CM | POA: Diagnosis not present

## 2023-01-30 DIAGNOSIS — M1712 Unilateral primary osteoarthritis, left knee: Secondary | ICD-10-CM

## 2023-01-30 DIAGNOSIS — Z6841 Body Mass Index (BMI) 40.0 and over, adult: Secondary | ICD-10-CM | POA: Diagnosis not present

## 2023-01-30 NOTE — Progress Notes (Signed)
Office Visit Note   Patient: [name]           Date of Birth: 06-17-1965           MRN: [id number] Visit Date: 01/30/2023              Requested by: Judi Saa, DO [address] [place],  [place] [address] PCP: [name], MD   Assessment & Plan: Visit Diagnoses:  1. Primary osteoarthritis of right knee   2. Primary osteoarthritis of left knee   3. Body mass index 40.0-44.9, adult [facility])     Plan: [name] is a 57 year old gentleman with advanced bilateral knee DJD with varus deformity.  He will continue to work on weight loss to achieve a BMI of 40 or less.  His goal weight is around 245 pounds.  Cortisone injections only last about a month at this point.  He has tried Visco injections in the past over 10 years ago.  We will go ahead and get approval for these again.  Will see him back for the gel injections.  This patient is diagnosed with osteoarthritis of the knee(s).    Radiographs show evidence of joint space narrowing, osteophytes, subchondral sclerosis and/or subchondral cysts.  This patient has knee pain which interferes with functional and activities of daily living.    This patient has experienced inadequate response, adverse effects and/or intolerance with conservative treatments such as acetaminophen, NSAIDS, topical creams, physical therapy or regular exercise, knee bracing and/or weight loss.   This patient has experienced inadequate response or has a contraindication to intra articular steroid injections for at least 3 months.   This patient is not scheduled to have a total knee replacement within 6 months of starting treatment with viscosupplementation.  The patient meets the AMA guidelines for Morbid (severe) obesity with a BMI > 40.0 and I have recommended weight loss.  Total face to face encounter time was greater than 25 minutes and over half of this time was spent in counseling and/or coordination of care.  Follow-Up Instructions:  No follow-ups on file.   Orders:  Orders Placed This Encounter  Procedures   XR KNEE 3 VIEW RIGHT   No orders of the defined types were placed in this encounter.     Procedures: No procedures performed   Clinical Data: No additional findings.   Subjective: Chief Complaint  Patient presents with   Right Knee - Pain    HPI [name] is a very pleasant 24 year old gentleman explained use of Tona who is a longtime patient of mine who comes in for evaluation of chronic bilateral knee pain but mainly in the right.  He has had constant pain that ranges from 6-10.  He is a Location manager.  He has had numerous cortisone shots that now only provide about a month of relief.  He currently works as a Location manager.  He has swelling at times. Review of Systems  Constitutional: Negative.   HENT: Negative.    Eyes: Negative.   Respiratory: Negative.    Cardiovascular: Negative.   Gastrointestinal: Negative.   Endocrine: Negative.   Genitourinary: Negative.   Skin: Negative.   Allergic/Immunologic: Negative.   Neurological: Negative.   Hematological: Negative.   Psychiatric/Behavioral: Negative.    All other systems reviewed and are negative.    Objective: Vital Signs: Ht 5\' 7"  (1.702 m)   Wt 275 lb (124.7 kg)   BMI 43.07 kg/m   Physical Exam Vitals  and nursing note reviewed.  Constitutional:      Appearance: He is well-developed.  HENT:     Head: Normocephalic and atraumatic.  Eyes:     Pupils: Pupils are equal, round, and reactive to light.  Pulmonary:     Effort: Pulmonary effort is normal.  Abdominal:     Palpations: Abdomen is soft.  Musculoskeletal:        General: Normal range of motion.     Cervical back: Neck supple.  Skin:    General: Skin is warm.  Neurological:     Mental Status: He is alert and oriented to person, place, and time.  Psychiatric:        Behavior: Behavior normal.        Thought Content: Thought content normal.         Judgment: Judgment normal.     Ortho Exam Examination of bilateral knees show medial joint line tenderness.  No joint effusion.  Crepitus and pain throughout range of motion.  Collaterals and cruciates are stable. Specialty Comments:  No specialty comments available.  Imaging: No results found.   PMFS History: Patient Active Problem List   Diagnosis Date Noted   Stable angina pectoris 09/25/2022   Facet arthropathy, lumbar 04/17/2022   Varicose veins of right lower extremity with inflammation 08/17/2021   Dyslipidemia, goal LDL below 100 08/17/2021   Encounter for general adult medical examination with abnormal findings 02/14/2021   Chronic anticoagulation 01/22/2021   Mild intermittent asthma without complication 11/13/2020   Polyp of colon 11/13/2020   Hypogonadism male 02/10/2020   Erectile dysfunction due to arterial insufficiency 02/08/2020   Intrinsic eczema 02/08/2020   H. pylori infection 01/25/2020   Excessive somnolence disorder 05/09/2019   AC (acromioclavicular) arthritis 05/03/2019   Subacromial bursitis of right shoulder joint 05/03/2019   Ejection fraction < 50% 02/03/2019   Vitamin D deficiency disease 11/03/2018   PUD (peptic ulcer disease) 01/20/2018   Nephrolithiasis 08/26/2017   Morbid obesity (HCC) 08/25/2017   GERD with esophagitis 08/24/2017   Degenerative arthritis of knee, bilateral 06/08/2017   Venous insufficiency of both lower extremities 10/05/2015   Heterozygous factor V Leiden mutation (HCC) 08/24/2013   OSA (obstructive sleep apnea) 08/13/2013   Incomplete right bundle branch block 06/18/2011   Essential hypertension 07/06/2008   ERECTILE DYSFUNCTION, ORGANIC 01/28/2007   Past Medical History:  Diagnosis Date   Anxiety    Arthritis    knees - no med   Cluster headache    Hx - resolved per patient   Depression    DVT of lower extremity (deep venous thrombosis) (HCC) 6/26-28/2013   Xarelto - resolved - history   HTN (hypertension)     currently no meds   Irritability and anger    PMH of   Kidney stone    passed stone, no surgery required.   Mild intermittent asthma without complication 11/13/2020   Rotator cuff arthropathy 2007   right   Saddle pulmonary embolus (HCC) 6/26-28/2013   post orthopedic surgery - resolved   Sleep apnea 09/2013   moderate OSA-has cpap but does not use it    Family History  Problem Relation Age of Onset   Cancer Mother        Mesothelioma    Diabetes Brother    Hypertension Brother    Stroke Brother 50   Heart attack Brother 55   Diabetes Maternal Aunt    Diabetes Maternal Grandmother    Heart disease Maternal Grandmother  Hypertension Maternal Grandmother    Stroke Maternal Grandmother        in 21s   Deep vein thrombosis Maternal Grandmother    Stroke Maternal Grandfather        > 55   Migraines Brother    Heart attack Brother 53   Healthy Father    Colon cancer Neg Hx    Esophageal cancer Neg Hx    Rectal cancer Neg Hx    Stomach cancer Neg Hx    Liver cancer Neg Hx    Pancreatic cancer Neg Hx    Other Neg Hx        low testosterone    Past Surgical History:  Procedure Laterality Date   arthroscopic knee surgery  10/09/2011   Dr Magnus Ivan   CARDIAC CATHETERIZATION  06/02/2005   LVdysfunction; normal coronaries   COLONOSCOPY     EXTRACORPOREAL SHOCK WAVE LITHOTRIPSY Left 09/05/2022   Procedure: EXTRACORPOREAL SHOCK WAVE LITHOTRIPSY (ESWL);  Surgeon: Jerilee Field, MD;  Location: [facility];  Service: Urology;  Laterality: Left;   EXTRACORPOREAL SHOCK WAVE LITHOTRIPSY Left 11/21/2022   Procedure: EXTRACORPOREAL SHOCK WAVE LITHOTRIPSY (ESWL);  Surgeon: Jerilee Field, MD;  Location: [facility];  Service: Urology;  Laterality: Left;   KNEE ARTHROSCOPY WITH MEDIAL MENISECTOMY Left 09/20/2014   medial menisectomy chondroplastytella medial plica excision  ;  Surgeon: Jodi Geralds, MD;  Location: [facility];  Service:  Orthopedics;  Laterality: Left;   LEFT HEART CATH AND CORONARY ANGIOGRAPHY N/A 10/17/2022   Procedure: LEFT HEART CATH AND CORONARY ANGIOGRAPHY;  Surgeon: [name], MD;  Location: [facility];  Service: Cardiovascular;  Laterality: N/A;   ROTATOR CUFF REPAIR  06/02/2005   right   UPPER GASTROINTESTINAL ENDOSCOPY  06/02/2005   normal   WISDOM TOOTH EXTRACTION     Social History   Occupational History   Occupation: Location manager    Occupation: Lobbyist  Tobacco Use   Smoking status: Former    Types: Cigars    Quit date: 10/04/2022    Years since quitting: 0.3    Passive exposure: Never   Smokeless tobacco: Never   Tobacco comments:    every 3-4 months  Vaping Use   Vaping status: Never Used  Substance and Sexual Activity   Alcohol use: Yes    Alcohol/week: 0.0 standard drinks of alcohol    Comment: drinks occasionally   Drug use: No   Sexual activity: Yes    Partners: Female    Birth control/protection: Condom

## 2023-02-01 ENCOUNTER — Ambulatory Visit
Admission: RE | Admit: 2023-02-01 | Discharge: 2023-02-01 | Disposition: A | Discharge status: Home / Self Care | Payer: No Typology Code available for payment source | Source: Ambulatory Visit | Attending: Family Medicine | Admitting: Family Medicine

## 2023-02-01 DIAGNOSIS — M47816 Spondylosis without myelopathy or radiculopathy, lumbar region: Secondary | ICD-10-CM

## 2023-02-05 ENCOUNTER — Encounter: Payer: Self-pay | Admitting: Family Medicine

## 2023-02-05 ENCOUNTER — Ambulatory Visit (INDEPENDENT_AMBULATORY_CARE_PROVIDER_SITE_OTHER): Payer: No Typology Code available for payment source | Admitting: Family Medicine

## 2023-02-05 VITALS — BP 104/72 | HR 62 | Ht 67.0 in | Wt 274.0 lb

## 2023-02-05 DIAGNOSIS — M47816 Spondylosis without myelopathy or radiculopathy, lumbar region: Secondary | ICD-10-CM

## 2023-02-05 DIAGNOSIS — M170 Bilateral primary osteoarthritis of knee: Secondary | ICD-10-CM | POA: Diagnosis not present

## 2023-02-05 NOTE — Patient Instructions (Addendum)
Target weight of 255 Consider facet injections Spinal stimulator, Dr. Jefm Bryant about surgery or Celebrex I am here for any questions

## 2023-02-05 NOTE — Assessment & Plan Note (Signed)
Patient is awaiting weight loss at the moment.  Need to get down to 255 pounds.  Discussed with patient that a BMI less than 40 she will become a surgical candidate and then follow-up with orthopedic surgery.  Patient is in agreement with that plan.  Trying to get approval for viscosupplementation again.  Will see if there is approval for this.  Follow-up with me again otherwise as needed

## 2023-02-05 NOTE — Assessment & Plan Note (Addendum)
Known facet arthritis and has had no significant progression with the MRI.  Awaiting the overread at the moment.  Discussed icing regimen and home exercises.  Discussed which activities to do and which ones to avoid.  Discussed spinal stimulator, discussed surgical intervention with patient failing everything else including the facet injections.  Patient would like to consider doing them again if needed.

## 2023-02-18 ENCOUNTER — Encounter: Payer: Self-pay | Admitting: Nurse Practitioner

## 2023-02-24 ENCOUNTER — Ambulatory Visit: Payer: No Typology Code available for payment source | Admitting: Family Medicine

## 2023-02-24 ENCOUNTER — Encounter: Payer: Self-pay | Admitting: Family Medicine

## 2023-02-24 VITALS — BP 134/88 | Ht 67.0 in | Wt 278.0 lb

## 2023-02-24 DIAGNOSIS — M170 Bilateral primary osteoarthritis of knee: Secondary | ICD-10-CM

## 2023-02-24 NOTE — Progress Notes (Signed)
HPI: Follow-up CAD/chest pain. Cardiac catheterization 2007 showed normal coronary arteries with distal LAD intramyocardial bridge without obstruction. Echocardiogram September 2020 showed normal LV function, trace mitral regurgitation, mild tricuspid regurgitation. Coronary CTA April 2024 showed 25 to 49% left main, minimal plaque in the LAD, calcified 50 to 69% stenosis in the circumflex; calcium score 123 which is 91st percentile.  FFR suggested obstructive disease in the ostial circumflex.  Cardiac catheterization May 2024 showed 30% distal LAD and 30% ostial to proximal circumflex; elevated left ventricular end-diastolic pressure 26 to 28 mmHg.  Since last seen he has occasional chest pain when he feels stressed or excited.  This typically lasts 5 minutes and resolves and is unchanged to improved.  He does not have dyspnea or syncope.  Current Outpatient Medications  Medication Sig Dispense Refill   Albuterol-Budesonide (AIRSUPRA) 90-80 MCG/ACT AERO Inhale 2 puffs into the lungs 4 (four) times daily as needed. 32.1 g 1   ALPRAZolam (XANAX) 0.5 MG tablet Take 1 tablet (0.5 mg total) by mouth at bedtime as needed for anxiety. 7 tablet 0   Azelastine HCl 137 MCG/SPRAY SOLN PLACE 1 SPRAY INTO BOTH NOSTRILS AT BEDTIME. USE IN EACH NOSTRIL AS DIRECTED 90 mL 3   furosemide (LASIX) 20 MG tablet Take 20 mg by mouth. Take 1 Tablet Daily. ( Can Take Additional Tablet As Needed).     HYDROcodone-acetaminophen (NORCO) 7.5-325 MG tablet Take 1 tablet by mouth every 6 (six) hours as needed for moderate pain. 10 tablet 0   Multiple Vitamin (MULTIVITAMIN WITH MINERALS) TABS tablet Take 1 tablet by mouth daily.     nitroGLYCERIN (NITROSTAT) 0.3 MG SL tablet Place 1 tablet (0.3 mg total) under the tongue 2 (two) times daily as needed. 90 tablet 0   rosuvastatin (CRESTOR) 10 MG tablet Take 1 tablet (10 mg total) by mouth daily. 90 tablet 1   No current facility-administered medications for this visit.      Past Medical History:  Diagnosis Date   Anxiety    Arthritis    knees - no med   Cluster headache    Hx - resolved per patient   Depression    DVT of lower extremity (deep venous thrombosis) (HCC) 6/26-28/2013   Xarelto - resolved - history   HTN (hypertension)    currently no meds   Irritability and anger    PMH of   Kidney stone    passed stone, no surgery required.   Mild intermittent asthma without complication 11/13/2020   Rotator cuff arthropathy 2007   right   Saddle pulmonary embolus (HCC) 6/26-28/2013   post orthopedic surgery - resolved   Sleep apnea 09/2013   moderate OSA-has cpap but does not use it    Past Surgical History:  Procedure Laterality Date   arthroscopic knee surgery  10/09/2011   Dr Magnus Ivan   CARDIAC CATHETERIZATION  06/02/2005   LVdysfunction; normal coronaries   COLONOSCOPY     EXTRACORPOREAL SHOCK WAVE LITHOTRIPSY Left 09/05/2022   Procedure: EXTRACORPOREAL SHOCK WAVE LITHOTRIPSY (ESWL);  Surgeon: Jerilee Field, MD;  Location: [facility];  Service: Urology;  Laterality: Left;   EXTRACORPOREAL SHOCK WAVE LITHOTRIPSY Left 11/21/2022   Procedure: EXTRACORPOREAL SHOCK WAVE LITHOTRIPSY (ESWL);  Surgeon: Jerilee Field, MD;  Location: [facility];  Service: Urology;  Laterality: Left;   KNEE ARTHROSCOPY WITH MEDIAL MENISECTOMY Left 09/20/2014   medial menisectomy chondroplastytella medial plica excision  ;  Surgeon: Jodi Geralds, MD;  Location: [facility];  Service: Orthopedics;  Laterality: Left;   LEFT HEART CATH AND CORONARY ANGIOGRAPHY N/A 10/17/2022   Procedure: LEFT HEART CATH AND CORONARY ANGIOGRAPHY;  Surgeon: [name], MD;  Location: [facility];  Service: Cardiovascular;  Laterality: N/A;   ROTATOR CUFF REPAIR  06/02/2005   right   UPPER GASTROINTESTINAL ENDOSCOPY  06/02/2005   normal   WISDOM TOOTH EXTRACTION      Social History   Socioeconomic History   Marital  status: Married    Spouse name: Not on file   Number of children: 6   Years of education: 11   Highest education level: Not on file  Occupational History   Occupation: Location manager    Occupation: Lobbyist  Tobacco Use   Smoking status: Former    Types: Cigars    Quit date: 10/04/2022    Years since quitting: 0.4    Passive exposure: Never   Smokeless tobacco: Never   Tobacco comments:    every 3-4 months  Vaping Use   Vaping status: Never Used  Substance and Sexual Activity   Alcohol use: Yes    Alcohol/week: 0.0 standard drinks of alcohol    Comment: drinks occasionally   Drug use: No   Sexual activity: Yes    Partners: Female    Birth control/protection: Condom  Other Topics Concern   Not on file  Social History Narrative   Fun: Exercise   Social Determinants of Health   Financial Resource Strain: Not on file  Food Insecurity: Not on file  Transportation Needs: Not on file  Physical Activity: Not on file  Stress: Not on file  Social Connections: Unknown (10/11/2021)   Received from [facility]   Social Network    Social Network: Not on file  Intimate Partner Violence: Unknown (09/02/2021)   Received from [employer], [facility]   HITS    Physically Hurt: Not on file    Insult or Talk Down To: Not on file    Threaten Physical Harm: Not on file    Scream or Curse: Not on file    Family History  Problem Relation Age of Onset   Cancer Mother        Mesothelioma    Diabetes Brother    Hypertension Brother    Stroke Brother 50   Heart attack Brother 55   Diabetes Maternal Aunt    Diabetes Maternal Grandmother    Heart disease Maternal Grandmother    Hypertension Maternal Grandmother    Stroke Maternal Grandmother        in 46s   Deep vein thrombosis Maternal Grandmother    Stroke Maternal Grandfather        > 55   Migraines Brother    Heart attack Brother 75   Healthy Father    Colon cancer Neg Hx    Esophageal cancer  Neg Hx    Rectal cancer Neg Hx    Stomach cancer Neg Hx    Liver cancer Neg Hx    Pancreatic cancer Neg Hx    Other Neg Hx        low testosterone    ROS: no fevers or chills, productive cough, hemoptysis, dysphasia, odynophagia, melena, hematochezia, dysuria, hematuria, rash, seizure activity, orthopnea, PND, pedal edema, claudication. Remaining systems are negative.  Physical Exam: Well-developed well-nourished in no acute distress.  Skin is warm and dry.  HEENT is normal.  Neck is supple.  Chest is clear  to auscultation with normal expansion.  Cardiovascular exam is regular rate and rhythm.  Abdominal exam nontender or distended. No masses palpated. Extremities show no edema. neuro grossly intact  EKG Interpretation Date/Time:  Friday March 06 2023 10:28:52 EDT Ventricular Rate:  68 PR Interval:  144 QRS Duration:  90 QT Interval:  388 QTC Calculation: 412 R Axis:   13  Text Interpretation: Normal sinus rhythm Normal ECG When compared with ECG of 04-Sep-2020 11:59, No significant change was found Confirmed by Olga Millers ([id number]) on 03/06/2023 10:29:52 AM    A/P  1 chest pain-patient has had difficulties with recurrent chest pain.  He had a recent cardiac catheterization revealing nonobstructive coronary disease.  Electrocardiogram shows no new ST changes.  Will not pursue further ischemia evaluation.  2 coronary artery disease-minimal on most recent catheterization.  Plan medical therapy with aspirin and statin.  3 hyperlipidemia-given history of coronary calcification will increase Crestor to 40 mg daily.  Check lipids and liver in 8 weeks.  4 hypertension-blood pressure controlled.  Continue present medications.  5 chronic diastolic congestive heart failure-LVEDP elevated at time of recent catheterization.  Continue low-dose Lasix.  Check potassium and renal function.  Olga Millers, MD

## 2023-02-24 NOTE — Assessment & Plan Note (Signed)
Severe end stage  Discussed HEP Discussed which activities to do and which ones to avoid.

## 2023-02-24 NOTE — Progress Notes (Signed)
[name] [address] [place] [address] Phone: [phone] Subjective:   [name], am serving as a scribe for Dr. Antoine Primas.  I'm seeing this patient by the request  of:  [name], MD  CC: Knee pain follow-up  [id number]  [name] is a 57 y.o. male coming in with complaint of B knee pain. Patient states that his knee pain has increased.  Patient does have some instability noted as well.  States that the pain is bad enough that he continues to have.  Affects daily activities. Still working on getting his BMI under 40 so he can consider replacement.     Past Medical History:  Diagnosis Date   Anxiety    Arthritis    knees - no med   Cluster headache    Hx - resolved per patient   Depression    DVT of lower extremity (deep venous thrombosis) (HCC) 6/26-28/2013   Xarelto - resolved - history   HTN (hypertension)    currently no meds   Irritability and anger    PMH of   Kidney stone    passed stone, no surgery required.   Mild intermittent asthma without complication 11/13/2020   Rotator cuff arthropathy 2007   right   Saddle pulmonary embolus (HCC) 6/26-28/2013   post orthopedic surgery - resolved   Sleep apnea 09/2013   moderate OSA-has cpap but does not use it   Past Surgical History:  Procedure Laterality Date   arthroscopic knee surgery  10/09/2011   Dr Magnus Ivan   CARDIAC CATHETERIZATION  06/02/2005   LVdysfunction; normal coronaries   COLONOSCOPY     EXTRACORPOREAL SHOCK WAVE LITHOTRIPSY Left 09/05/2022   Procedure: EXTRACORPOREAL SHOCK WAVE LITHOTRIPSY (ESWL);  Surgeon: Jerilee Field, MD;  Location[facility];  Service: Urology;  Laterality: Left;   EXTRACORPOREAL SHOCK WAVE LITHOTRIPSY Left 11/21/2022   Procedure: EXTRACORPOREAL SHOCK WAVE LITHOTRIPSY (ESWL);  Surgeon: Jerilee Field, MD;  Location: [facility];  Service: Urology;  Laterality: Left;    KNEE ARTHROSCOPY WITH MEDIAL MENISECTOMY Left 09/20/2014   medial menisectomy chondroplastytella medial plica excision  ;  Surgeon: Jodi Geralds, MD;  Location: [facility];  Service: Orthopedics;  Laterality: Left;   LEFT HEART CATH AND CORONARY ANGIOGRAPHY N/A 10/17/2022   Procedure: LEFT HEART CATH AND CORONARY ANGIOGRAPHY;  Surgeon: [name], MD;  Location: [facility];  Service: Cardiovascular;  Laterality: N/A;   ROTATOR CUFF REPAIR  06/02/2005   right   UPPER GASTROINTESTINAL ENDOSCOPY  06/02/2005   normal   WISDOM TOOTH EXTRACTION     Social History   Socioeconomic History   Marital status: Married    Spouse name: Not on file   Number of children: 6   Years of education: 11   Highest education level: Not on file  Occupational History   Occupation: Location manager    Occupation: Lobbyist  Tobacco Use   Smoking status: Former    Types: Cigars    Quit date: 10/04/2022    Years since quitting: 0.3    Passive exposure: Never   Smokeless tobacco: Never   Tobacco comments:    every 3-4 months  Vaping Use   Vaping status: Never Used  Substance and Sexual Activity   Alcohol use: Yes    Alcohol/week: 0.0 standard drinks of alcohol    Comment: drinks occasionally   Drug use: No   Sexual  activity: Yes    Partners: Female    Birth control/protection: Condom  Other Topics Concern   Not on file  Social History Narrative   Fun: Exercise   Social Determinants of Health   Financial Resource Strain: Not on file  Food Insecurity: Not on file  Transportation Needs: Not on file  Physical Activity: Not on file  Stress: Not on file  Social Connections: Unknown (10/11/2021)   Received from [employer], [facility]   Social Network    Social Network: Not on file   No Known Allergies Family History  Problem Relation Age of Onset   Cancer Mother        Mesothelioma    Diabetes Brother    Hypertension Brother    Stroke Brother 50    Heart attack Brother 55   Diabetes Maternal Aunt    Diabetes Maternal Grandmother    Heart disease Maternal Grandmother    Hypertension Maternal Grandmother    Stroke Maternal Grandmother        in 33s   Deep vein thrombosis Maternal Grandmother    Stroke Maternal Grandfather        > 55   Migraines Brother    Heart attack Brother 61   Healthy Father    Colon cancer Neg Hx    Esophageal cancer Neg Hx    Rectal cancer Neg Hx    Stomach cancer Neg Hx    Liver cancer Neg Hx    Pancreatic cancer Neg Hx    Other Neg Hx        low testosterone     Current Outpatient Medications (Cardiovascular):    furosemide (LASIX) 20 MG tablet, Take 20 mg by mouth. Take 1 Tablet Daily. ( Can Take Additional Tablet As Needed).   nitroGLYCERIN (NITROSTAT) 0.3 MG SL tablet, Place 1 tablet (0.3 mg total) under the tongue 2 (two) times daily as needed.   rosuvastatin (CRESTOR) 10 MG tablet, Take 1 tablet (10 mg total) by mouth daily.  Current Outpatient Medications (Respiratory):    Albuterol-Budesonide (AIRSUPRA) 90-80 MCG/ACT AERO, Inhale 2 puffs into the lungs 4 (four) times daily as needed.   Azelastine HCl 137 MCG/SPRAY SOLN, PLACE 1 SPRAY INTO BOTH NOSTRILS AT BEDTIME. USE IN EACH NOSTRIL AS DIRECTED  Current Outpatient Medications (Analgesics):    HYDROcodone-acetaminophen (NORCO) 7.5-325 MG tablet, Take 1 tablet by mouth every 6 (six) hours as needed for moderate pain.   Current Outpatient Medications (Other):    ALPRAZolam (XANAX) 0.5 MG tablet, Take 1 tablet (0.5 mg total) by mouth at bedtime as needed for anxiety.   Multiple Vitamin (MULTIVITAMIN WITH MINERALS) TABS tablet, Take 1 tablet by mouth daily.   Reviewed prior external information including notes and imaging from  primary care provider As well as notes that were available from care everywhere and other healthcare systems.  Past medical history, social, surgical and family history all reviewed in electronic medical record.   No pertanent information unless stated regarding to the chief complaint.   Review of Systems:  No headache, visual changes, nausea, vomiting, diarrhea, constipation, dizziness, abdominal pain, skin rash, fevers, chills, night sweats, weight loss, swollen lymph nodes, body aches, joint swelling, chest pain, shortness of breath, mood changes. POSITIVE muscle aches  Objective  Blood pressure 134/88, height 5\' 7"  (1.702 m), weight 278 lb (126.1 kg).   General: No apparent distress alert and oriented x3 mood and affect normal, dressed appropriately.  HEENT: Pupils equal, extraocular movements intact  Respiratory: Patient's  speak in full sentences and does not appear short of breath  Cardiovascular: No lower extremity edema, non tender, no erythema  Severely antalgic gait noted.  Tender to palpation noted.  Antalgic gait.  Patient does have some instability with valgus and varus force.  Significant effusion noted of the left knee.  Lacking even flexion of the knee greater than 90 degrees.  After informed written and verbal consent, patient was seated on exam table. Right knee was prepped with alcohol swab and utilizing anterolateral approach, patient's right knee space was injected with 4:1  marcaine 0.5%: Kenalog 40mg /dL. Patient tolerated the procedure well without immediate complications.  After informed written and verbal consent, patient was seated on exam table. Left knee was prepped with alcohol swab and utilizing anterolateral approach, patient's left knee space was injected with 4:1  marcaine 0.5%: Kenalog 40mg /dL. Patient tolerated the procedure well without immediate complications.    Impression and Recommendations:    The above documentation has been reviewed and is accurate and complete Judi Saa, DO

## 2023-02-24 NOTE — Patient Instructions (Addendum)
Injected both knees today See me again in 2 months

## 2023-03-06 ENCOUNTER — Ambulatory Visit: Payer: No Typology Code available for payment source | Attending: Cardiology | Admitting: Cardiology

## 2023-03-06 ENCOUNTER — Encounter: Payer: Self-pay | Admitting: Cardiology

## 2023-03-06 VITALS — BP 130/80 | HR 67 | Ht 67.0 in | Wt 269.0 lb

## 2023-03-06 DIAGNOSIS — I10 Essential (primary) hypertension: Secondary | ICD-10-CM | POA: Diagnosis not present

## 2023-03-06 DIAGNOSIS — E785 Hyperlipidemia, unspecified: Secondary | ICD-10-CM | POA: Diagnosis not present

## 2023-03-06 DIAGNOSIS — E7800 Pure hypercholesterolemia, unspecified: Secondary | ICD-10-CM | POA: Diagnosis not present

## 2023-03-06 DIAGNOSIS — I25118 Atherosclerotic heart disease of native coronary artery with other forms of angina pectoris: Secondary | ICD-10-CM | POA: Diagnosis not present

## 2023-03-06 DIAGNOSIS — I2510 Atherosclerotic heart disease of native coronary artery without angina pectoris: Secondary | ICD-10-CM

## 2023-03-06 DIAGNOSIS — I2089 Other forms of angina pectoris: Secondary | ICD-10-CM

## 2023-03-06 MED ORDER — ROSUVASTATIN CALCIUM 40 MG PO TABS
40.0000 mg | ORAL_TABLET | Freq: Every day | ORAL | 3 refills | Status: DC
Start: 2023-03-06 — End: 2023-12-01

## 2023-03-06 MED ORDER — NITROGLYCERIN 0.3 MG SL SUBL
0.3000 mg | SUBLINGUAL_TABLET | Freq: Two times a day (BID) | SUBLINGUAL | 0 refills | Status: DC | PRN
Start: 2023-03-06 — End: 2023-08-11

## 2023-03-06 NOTE — Patient Instructions (Signed)
Medication Instructions:   INCREASE ROSUVASTATIN TO 40 MG ONCE DAILY= 4 OF THE 10 MG TABLETS ONCE DAILY  *If you need a refill on your cardiac medications before your next appointment, please call your pharmacy*   Lab Work:  Your physician recommends that you return for lab work in: [facility]  If you have labs (blood work) drawn today and your tests are completely normal, you will receive your results only by: MyChart Message (if you have MyChart) OR A paper copy in the mail If you have any lab test that is abnormal or we need to change your treatment, we will call you to review the results.   Follow-Up: At [facility], you and your health needs are our priority.  As part of our continuing mission to provide you with exceptional heart care, we have created designated Provider Care Teams.  These Care Teams include your primary Cardiologist (physician) and Advanced Practice Providers (APPs -  Physician Assistants and Nurse Practitioners) who all work together to provide you with the care you need, when you need it.  We recommend signing up for the patient portal called "MyChart".  Sign up information is provided on this After Visit Summary.  MyChart is used to connect with patients for Virtual Visits (Telemedicine).  Patients are able to view lab/test results, encounter notes, upcoming appointments, etc.  Non-urgent messages can be sent to your provider as well.   To learn more about what you can do with MyChart, go to [email or web address].    Your next appointment:   12 month(s)  Provider:   Olga Millers, MD

## 2023-03-20 ENCOUNTER — Ambulatory Visit: Payer: No Typology Code available for payment source | Admitting: Family Medicine

## 2023-03-23 ENCOUNTER — Other Ambulatory Visit: Payer: Self-pay | Admitting: Internal Medicine

## 2023-03-23 DIAGNOSIS — E785 Hyperlipidemia, unspecified: Secondary | ICD-10-CM

## 2023-03-25 ENCOUNTER — Telehealth: Payer: Self-pay | Admitting: Family Medicine

## 2023-03-25 ENCOUNTER — Ambulatory Visit (INDEPENDENT_AMBULATORY_CARE_PROVIDER_SITE_OTHER): Payer: No Typology Code available for payment source | Admitting: Internal Medicine

## 2023-03-25 ENCOUNTER — Encounter: Payer: Self-pay | Admitting: Internal Medicine

## 2023-03-25 VITALS — BP 154/98 | HR 93 | Temp 98.1°F | Resp 16 | Ht 67.0 in | Wt 273.8 lb

## 2023-03-25 DIAGNOSIS — I10 Essential (primary) hypertension: Secondary | ICD-10-CM | POA: Diagnosis not present

## 2023-03-25 DIAGNOSIS — F411 Generalized anxiety disorder: Secondary | ICD-10-CM

## 2023-03-25 DIAGNOSIS — R9430 Abnormal result of cardiovascular function study, unspecified: Secondary | ICD-10-CM | POA: Diagnosis not present

## 2023-03-25 DIAGNOSIS — N529 Male erectile dysfunction, unspecified: Secondary | ICD-10-CM

## 2023-03-25 DIAGNOSIS — E785 Hyperlipidemia, unspecified: Secondary | ICD-10-CM | POA: Diagnosis not present

## 2023-03-25 DIAGNOSIS — IMO0002 Reserved for concepts with insufficient information to code with codable children: Secondary | ICD-10-CM

## 2023-03-25 MED ORDER — FUROSEMIDE 20 MG PO TABS
20.0000 mg | ORAL_TABLET | Freq: Two times a day (BID) | ORAL | 0 refills | Status: DC
Start: 2023-03-25 — End: 2023-10-13

## 2023-03-25 MED ORDER — TADALAFIL 5 MG PO TABS: 5.0000 mg | ORAL_TABLET | Freq: Every day | ORAL | 1 refills | Status: DC

## 2023-03-25 MED ORDER — ALPRAZOLAM 0.5 MG PO TABS
0.5000 mg | ORAL_TABLET | Freq: Two times a day (BID) | ORAL | 0 refills | Status: AC | PRN
Start: 2023-03-25 — End: ?

## 2023-03-25 NOTE — Patient Instructions (Signed)
Hypertension, Adult High blood pressure (hypertension) is when the force of blood pumping through the arteries is too strong. The arteries are the blood vessels that carry blood from the heart throughout the body. Hypertension forces the heart to work harder to pump blood and may cause arteries to become narrow or stiff. Untreated or uncontrolled hypertension can lead to a heart attack, heart failure, a stroke, kidney disease, and other problems. A blood pressure reading consists of a higher number over a lower number. Ideally, your blood pressure should be below 120/80. The first ("top") number is called the systolic pressure. It is a measure of the pressure in your arteries as your heart beats. The second ("bottom") number is called the diastolic pressure. It is a measure of the pressure in your arteries as the heart relaxes. What are the causes? The exact cause of this condition is not known. There are some conditions that result in high blood pressure. What increases the risk? Certain factors may make you more likely to develop high blood pressure. Some of these risk factors are under your control, including: Smoking. Not getting enough exercise or physical activity. Being overweight. Having too much fat, sugar, calories, or salt (sodium) in your diet. Drinking too much alcohol. Other risk factors include: Having a personal history of heart disease, diabetes, high cholesterol, or kidney disease. Stress. Having a family history of high blood pressure and high cholesterol. Having obstructive sleep apnea. Age. The risk increases with age. What are the signs or symptoms? High blood pressure may not cause symptoms. Very high blood pressure (hypertensive crisis) may cause: Headache. Fast or irregular heartbeats (palpitations). Shortness of breath. Nosebleed. Nausea and vomiting. Vision changes. Severe chest pain, dizziness, and seizures. How is this diagnosed? This condition is diagnosed by  measuring your blood pressure while you are seated, with your arm resting on a flat surface, your legs uncrossed, and your feet flat on the floor. The cuff of the blood pressure monitor will be placed directly against the skin of your upper arm at the level of your heart. Blood pressure should be measured at least twice using the same arm. Certain conditions can cause a difference in blood pressure between your right and left arms. If you have a high blood pressure reading during one visit or you have normal blood pressure with other risk factors, you may be asked to: Return on a different day to have your blood pressure checked again. Monitor your blood pressure at home for 1 week or longer. If you are diagnosed with hypertension, you may have other blood or imaging tests to help your health care provider understand your overall risk for other conditions. How is this treated? This condition is treated by making healthy lifestyle changes, such as eating healthy foods, exercising more, and reducing your alcohol intake. You may be referred for counseling on a healthy diet and physical activity. Your health care provider may prescribe medicine if lifestyle changes are not enough to get your blood pressure under control and if: Your systolic blood pressure is above 130. Your diastolic blood pressure is above 80. Your personal target blood pressure may vary depending on your medical conditions, your age, and other factors. Follow these instructions at home: Eating and drinking  Eat a diet that is high in fiber and potassium, and low in sodium, added sugar, and fat. An example of this eating plan is called the DASH diet. DASH stands for Dietary Approaches to Stop Hypertension. To eat this way: Eat   plenty of fresh fruits and vegetables. Try to fill one half of your plate at each meal with fruits and vegetables. Eat whole grains, such as whole-wheat pasta, brown rice, or whole-grain bread. Fill about one  fourth of your plate with whole grains. Eat or drink low-fat dairy products, such as skim milk or low-fat yogurt. Avoid fatty cuts of meat, processed or cured meats, and poultry with skin. Fill about one fourth of your plate with lean proteins, such as fish, chicken without skin, beans, eggs, or tofu. Avoid pre-made and processed foods. These tend to be higher in sodium, added sugar, and fat. Reduce your daily sodium intake. Many people with hypertension should eat less than 1,500 mg of sodium a day. Do not drink alcohol if: Your health care provider tells you not to drink. You are pregnant, may be pregnant, or are planning to become pregnant. If you drink alcohol: Limit how much you have to: 0-1 drink a day for women. 0-2 drinks a day for men. Know how much alcohol is in your drink. In the [place] one drink equals one 12 oz bottle of beer (355 mL), one 5 oz glass of wine (148 mL), or one 1 oz glass of hard liquor (44 mL). Lifestyle  Work with your health care provider to maintain a healthy body weight or to lose weight. Ask what an ideal weight is for you. Get at least 30 minutes of exercise that causes your heart to beat faster (aerobic exercise) most days of the week. Activities may include walking, swimming, or biking. Include exercise to strengthen your muscles (resistance exercise), such as Pilates or lifting weights, as part of your weekly exercise routine. Try to do these types of exercises for 30 minutes at least 3 days a week. Do not use any products that contain nicotine or tobacco. These products include cigarettes, chewing tobacco, and vaping devices, such as e-cigarettes. If you need help quitting, ask your health care provider. Monitor your blood pressure at home as told by your health care provider. Keep all follow-up visits. This is important. Medicines Take over-the-counter and prescription medicines only as told by your health care provider. Follow directions carefully. Blood  pressure medicines must be taken as prescribed. Do not skip doses of blood pressure medicine. Doing this puts you at risk for problems and can make the medicine less effective. Ask your health care provider about side effects or reactions to medicines that you should watch for. Contact a health care provider if you: Think you are having a reaction to a medicine you are taking. Have headaches that keep coming back (recurring). Feel dizzy. Have swelling in your ankles. Have trouble with your vision. Get help right away if you: Develop a severe headache or confusion. Have unusual weakness or numbness. Feel faint. Have severe pain in your chest or abdomen. Vomit repeatedly. Have trouble breathing. These symptoms may be an emergency. Get help right away. Call 911. Do not wait to see if the symptoms will go away. Do not drive yourself to the hospital. Summary Hypertension is when the force of blood pumping through your arteries is too strong. If this condition is not controlled, it may put you at risk for serious complications. Your personal target blood pressure may vary depending on your medical conditions, your age, and other factors. For most people, a normal blood pressure is less than 120/80. Hypertension is treated with lifestyle changes, medicines, or a combination of both. Lifestyle changes include losing weight, eating a healthy,   low-sodium diet, exercising more, and limiting alcohol. This information is not intended to replace advice given to you by your health care provider. Make sure you discuss any questions you have with your health care provider. Document Revised: 03/26/2021 Document Reviewed: 03/26/2021 Elsevier Patient Education  2024 Elsevier Inc.  

## 2023-03-25 NOTE — Progress Notes (Signed)
Subjective:  Patient ID: [name], male    DOB: 01/14/1966  Age: 57 y.o. MRN: [id number]  CC: Hypertension and Hyperlipidemia   HPI [name] presents for f/up ---  Discussed the use of AI scribe software for clinical note transcription with the patient, who gave verbal consent to proceed.  History of Present Illness   The patient presents with a recent diagnosis of borderline diabetes, as indicated by a blood test conducted at his workplace. He is unable to recall the exact A1c number but believes it was around 5.9. He denies experiencing any symptoms such as headaches, blurred vision, chest pain, or shortness of breath.   He last saw his cardiologist approximately two weeks prior to this visit.       Outpatient Medications Prior to Visit  Medication Sig Dispense Refill   Albuterol-Budesonide (AIRSUPRA) 90-80 MCG/ACT AERO Inhale 2 puffs into the lungs 4 (four) times daily as needed. 32.1 g 1   Azelastine HCl 137 MCG/SPRAY SOLN PLACE 1 SPRAY INTO BOTH NOSTRILS AT BEDTIME. USE IN EACH NOSTRIL AS DIRECTED 90 mL 3   Multiple Vitamin (MULTIVITAMIN WITH MINERALS) TABS tablet Take 1 tablet by mouth daily.     nitroGLYCERIN (NITROSTAT) 0.3 MG SL tablet Place 1 tablet (0.3 mg total) under the tongue 2 (two) times daily as needed. 90 tablet 0   rosuvastatin (CRESTOR) 40 MG tablet Take 1 tablet (40 mg total) by mouth daily. 90 tablet 3   ALPRAZolam (XANAX) 0.5 MG tablet Take 1 tablet (0.5 mg total) by mouth at bedtime as needed for anxiety. 7 tablet 0   furosemide (LASIX) 20 MG tablet Take 20 mg by mouth. Take 1 Tablet Daily. ( Can Take Additional Tablet As Needed). (Patient not taking: Reported on 03/25/2023)     HYDROcodone-acetaminophen (NORCO) 7.5-325 MG tablet Take 1 tablet by mouth every 6 (six) hours as needed for moderate pain. (Patient not taking: Reported on 03/25/2023) 10 tablet 0   No facility-administered medications prior to visit.    ROS Review of Systems   Constitutional:  Negative for chills, diaphoresis, fatigue and fever.  HENT: Negative.    Eyes: Negative.   Respiratory: Negative.  Negative for cough, chest tightness, shortness of breath and wheezing.   Cardiovascular:  Negative for chest pain, palpitations and leg swelling.  Gastrointestinal:  Negative for abdominal pain, constipation, diarrhea, nausea and vomiting.  Endocrine: Negative.   Genitourinary: Negative.  Negative for difficulty urinating.  Musculoskeletal:  Negative for arthralgias and myalgias.  Skin: Negative.  Negative for color change and pallor.  Neurological: Negative.  Negative for dizziness, weakness and light-headedness.  Hematological:  Negative for adenopathy. Does not bruise/bleed easily.  Psychiatric/Behavioral:  Negative for decreased concentration, dysphoric mood and sleep disturbance. The patient is nervous/anxious.     Objective:  BP (!) 154/98 (BP Location: Left Arm, Patient Position: Sitting, Cuff Size: Normal)   Pulse 93   Temp 98.1 F (36.7 C) (Oral)   Resp 16   Ht 5\' 7"  (1.702 m)   Wt 273 lb 12.8 oz (124.2 kg)   SpO2 93%   BMI 42.88 kg/m   BP Readings from Last 3 Encounters:  03/25/23 (!) 154/98  03/06/23 130/80  02/24/23 134/88    Wt Readings from Last 3 Encounters:  03/25/23 273 lb 12.8 oz (124.2 kg)  03/06/23 269 lb (122 kg)  02/24/23 278 lb (126.1 kg)    Physical Exam Vitals reviewed.  Constitutional:      Appearance: He is  not ill-appearing.  HENT:     Mouth/Throat:     Mouth: Mucous membranes are moist.  Eyes:     General: No scleral icterus.    Conjunctiva/sclera: Conjunctivae normal.  Cardiovascular:     Rate and Rhythm: Normal rate and regular rhythm.     Heart sounds: No murmur heard. Pulmonary:     Effort: Pulmonary effort is normal.     Breath sounds: No stridor. No wheezing, rhonchi or rales.  Abdominal:     Palpations: There is no mass.     Tenderness: There is no abdominal tenderness. There is no guarding.      Hernia: No hernia is present.  Musculoskeletal:        General: Normal range of motion.     Cervical back: Neck supple.     Right lower leg: No edema.     Left lower leg: No edema.  Skin:    General: Skin is warm and dry.  Neurological:     General: No focal deficit present.     Mental Status: He is alert. Mental status is at baseline.  Psychiatric:        Mood and Affect: Mood normal.        Behavior: Behavior normal.     Lab Results  Component Value Date   WBC 11.2 (H) 03/25/2023   HGB 13.3 03/25/2023   HCT 41.4 03/25/2023   PLT 232.0 03/25/2023   GLUCOSE 86 03/25/2023   CHOL 120 03/25/2023   TRIG 58.0 03/25/2023   HDL 46.10 03/25/2023   LDLCALC 63 03/25/2023   ALT 33 06/23/2022   AST 19 06/23/2022   NA 141 03/25/2023   K 4.5 03/25/2023   CL 106 03/25/2023   CREATININE 0.86 03/25/2023   BUN 11 03/25/2023   CO2 29 03/25/2023   TSH 1.63 06/23/2022   PSA 0.36 06/23/2022   INR CANCELED 08/12/2013   HGBA1C 5.7 (H) 12/22/2019    MR Lumbar Spine Wo Contrast  Result Date: 02/05/2023 CLINICAL DATA:  Back pain with bilateral radiculopathy EXAM: MRI LUMBAR SPINE WITHOUT CONTRAST TECHNIQUE: Multiplanar, multisequence MR imaging of the lumbar spine was performed. No intravenous contrast was administered. COMPARISON:  None Available. FINDINGS: Segmentation:  Standard. Alignment:  Physiologic. Vertebrae:  No fracture, evidence of discitis, or bone lesion. Conus medullaris and cauda equina: Conus extends to the L2 level. Conus and cauda equina appear normal. Paraspinal and other soft tissues: Negative. Disc levels: T12-L1: Only imaged in the sagittal plane. No evidence of high-grade spinal canal or neural foraminal stenosis. L1-L2: Unremarkable L2-L3: Mild bilateral facet degenerative change. No spinal canal or neural foraminal narrowing. L3-L4: Moderate bilateral facet degenerative change with fluid in the facet joints. Minimal disc bulge. Short pedicles. Mild spinal canal  narrowing. Mild bilateral neural foraminal narrowing. L4-L5: Moderate bilateral facet degenerative change with fluid in the facet joint on the right. Minimal disc bulge. No spinal canal narrowing. Mild bilateral neural foraminal narrowing. L5-S1: Moderate bilateral facet degenerative change. No significant disc bulge. No spinal canal narrowing. No neural foraminal narrowing. IMPRESSION: 1. Mild lower lumbar spine predominant degenerative change without evidence of high-grade spinal canal or neural foraminal stenosis. 2. Moderate bilateral facet degenerative change at L3-L4 and L4-L5 with fluid in the facet joints at these levels, which can be a source of pain. Electronically Signed   By: Lorenza Cambridge M.D.   On: 02/05/2023 15:36    Assessment & Plan:  Essential hypertension - His BP is not at goal. Will  restart the loop diuretic. -     Basic metabolic panel; Future -     CBC with Differential/Platelet; Future -     Furosemide; Take 1 tablet (20 mg total) by mouth 2 (two) times daily. Take 1 Tablet Daily. ( Can Take Additional Tablet As Needed).  Dispense: 180 tablet; Refill: 0  Dyslipidemia, goal LDL below 100 - LDL goal achieved. Doing well on the statin  -     Lipid panel; Future  GAD (generalized anxiety disorder) -     ALPRAZolam; Take 1 tablet (0.5 mg total) by mouth 2 (two) times daily as needed for anxiety.  Dispense: 180 tablet; Refill: 0  Ejection fraction < 50% -     Furosemide; Take 1 tablet (20 mg total) by mouth 2 (two) times daily. Take 1 Tablet Daily. ( Can Take Additional Tablet As Needed).  Dispense: 180 tablet; Refill: 0  ERECTILE DYSFUNCTION, ORGANIC -     Tadalafil; Take 1 tablet (5 mg total) by mouth daily.  Dispense: 90 tablet; Refill: 1     Follow-up: Return in about 4 months (around 07/26/2023).  Sanda Linger, MD

## 2023-03-25 NOTE — Telephone Encounter (Signed)
Pt stopped by today, knees bothering him.  Last visit 9/24, set next visit for 2 months on 11/21. Unsure if we could push appt/injections up or do anything in the interim.

## 2023-03-26 LAB — CBC WITH DIFFERENTIAL/PLATELET
Basophils Absolute: 0.1 10*3/uL (ref 0.0–0.1)
Basophils Relative: 0.9 % (ref 0.0–3.0)
Eosinophils Absolute: 0.3 10*3/uL (ref 0.0–0.7)
Eosinophils Relative: 3 % (ref 0.0–5.0)
HCT: 41.4 % (ref 39.0–52.0)
Hemoglobin: 13.3 g/dL (ref 13.0–17.0)
Lymphocytes Relative: 23 % (ref 12.0–46.0)
Lymphs Abs: 2.6 10*3/uL (ref 0.7–4.0)
MCHC: 32.3 g/dL (ref 30.0–36.0)
MCV: 90.7 fL (ref 78.0–100.0)
Monocytes Absolute: 1 10*3/uL (ref 0.1–1.0)
Monocytes Relative: 8.9 % (ref 3.0–12.0)
Neutro Abs: 7.2 10*3/uL (ref 1.4–7.7)
Neutrophils Relative %: 64.2 % (ref 43.0–77.0)
Platelets: 232 10*3/uL (ref 150.0–400.0)
RBC: 4.56 Mil/uL (ref 4.22–5.81)
RDW: 13.8 % (ref 11.5–15.5)
WBC: 11.2 10*3/uL — ABNORMAL HIGH (ref 4.0–10.5)

## 2023-03-26 LAB — BASIC METABOLIC PANEL
BUN: 11 mg/dL (ref 6–23)
CO2: 29 meq/L (ref 19–32)
Calcium: 9.6 mg/dL (ref 8.4–10.5)
Chloride: 106 meq/L (ref 96–112)
Creatinine, Ser: 0.86 mg/dL (ref 0.40–1.50)
GFR: 96.14 mL/min (ref >60.00)
Glucose, Bld: 86 mg/dL (ref 70–99)
Potassium: 4.5 meq/L (ref 3.5–5.1)
Sodium: 141 meq/L (ref 135–145)

## 2023-03-26 LAB — LIPID PANEL
Cholesterol: 120 mg/dL (ref 0–200)
HDL: 46.1 mg/dL (ref >39.00)
LDL Cholesterol: 63 mg/dL (ref 0–99)
NonHDL: 74.38
Total CHOL/HDL Ratio: 3
Triglycerides: 58 mg/dL (ref 0.0–149.0)
VLDL: 11.6 mg/dL (ref 0.0–40.0)

## 2023-03-26 NOTE — Telephone Encounter (Signed)
Sent patient MyChart message.

## 2023-04-03 ENCOUNTER — Ambulatory Visit: Payer: No Typology Code available for payment source | Admitting: Nurse Practitioner

## 2023-04-10 ENCOUNTER — Other Ambulatory Visit: Payer: Self-pay

## 2023-04-10 ENCOUNTER — Telehealth: Payer: Self-pay | Admitting: Orthopaedic Surgery

## 2023-04-10 ENCOUNTER — Telehealth: Payer: Self-pay | Admitting: Family Medicine

## 2023-04-10 MED ORDER — IBUPROFEN 800 MG PO TABS: 800.0000 mg | ORAL_TABLET | Freq: Two times a day (BID) | ORAL | 3 refills | Status: DC | PRN

## 2023-04-10 NOTE — Telephone Encounter (Signed)
Pt requesting Gel injections for Bil knee pain was supposed to get the referral in August but I believe Roda Shutters forgot to put it in, it was mentioned in the notes

## 2023-04-10 NOTE — Telephone Encounter (Signed)
Pt called, has bilateral swelling from ankles to knees and is in pain. Aware too early for injections.  Can he get an rx for 800 mg ibuprofen to [facility] [address].

## 2023-04-13 NOTE — Telephone Encounter (Signed)
Pt informed via VM

## 2023-04-14 ENCOUNTER — Ambulatory Visit (INDEPENDENT_AMBULATORY_CARE_PROVIDER_SITE_OTHER): Payer: No Typology Code available for payment source | Admitting: Physician Assistant

## 2023-04-14 VITALS — Ht 67.0 in | Wt 270.0 lb

## 2023-04-14 DIAGNOSIS — M170 Bilateral primary osteoarthritis of knee: Secondary | ICD-10-CM

## 2023-04-14 MED ORDER — LIDOCAINE HCL 1 % IJ SOLN
3.0000 mL | INTRAMUSCULAR | Status: AC | PRN
Start: 2023-04-14 — End: 2023-04-14
  Administered 2023-04-14: 3 mL

## 2023-04-14 MED ORDER — BUPIVACAINE HCL 0.25 % IJ SOLN
0.6600 mL | INTRAMUSCULAR | Status: AC | PRN
Start: 2023-04-14 — End: 2023-04-14
  Administered 2023-04-14: .66 mL via INTRA_ARTICULAR

## 2023-04-14 MED ORDER — TRAMADOL HCL 50 MG PO TABS: 50.0000 mg | ORAL_TABLET | Freq: Two times a day (BID) | ORAL | 2 refills | Status: DC | PRN

## 2023-04-14 MED ORDER — METHYLPREDNISOLONE ACETATE 40 MG/ML IJ SUSP
13.3300 mg | INTRAMUSCULAR | Status: AC | PRN
Start: 2023-04-14 — End: 2023-04-14
  Administered 2023-04-14: 13.33 mg via INTRA_ARTICULAR

## 2023-04-14 NOTE — Telephone Encounter (Signed)
VOB has been submitted for Monovisc, bilateral knee

## 2023-04-14 NOTE — Progress Notes (Signed)
Office Visit Note   Patient: [name]           Date of Birth: [birth year]           MRN: [id number] Visit Date: 04/14/2023              Requested by: [name], MD [address] [place],  [place] [address] PCP: [name], MD   Assessment & Plan: Visit Diagnoses:  1. Primary osteoarthritis of both knees     Plan: Impression is bilateral knee osteoarthritis.  Today, we discussed various treatment options to include repeat cortisone injection as insurance denied viscosupplementation injection.  He would like to proceed with cortisone injections to both knees today.  He will continue to work at weight loss.  He will need to get to a weight of under 250 pounds in order to obtain a BMI of less than 40 before proceeding with total knee arthroplasty.  He understands and agrees.  He will follow-up as needed.  Follow-Up Instructions: Return if symptoms worsen or fail to improve.   Orders:  Orders Placed This Encounter  Procedures   Large Joint Inj: bilateral knee   Meds ordered this encounter  Medications   traMADol (ULTRAM) 50 MG tablet    Sig: Take 1 tablet (50 mg total) by mouth every 12 (twelve) hours as needed.    Dispense:  30 tablet    Refill:  2      Procedures: Large Joint Inj: bilateral knee on 04/14/2023 3:58 PM Indications: pain Details: 22 G needle, anterolateral approach Medications (Right): 0.66 mL bupivacaine 0.25 %; 3 mL lidocaine 1 %; 13.33 mg methylPREDNISolone acetate 40 MG/ML Medications (Left): 0.66 mL bupivacaine 0.25 %; 3 mL lidocaine 1 %; 13.33 mg methylPREDNISolone acetate 40 MG/ML      Clinical Data: No additional findings.   Subjective: Chief Complaint  Patient presents with   Right Knee - Pain   Left Knee - Pain    HPI patient is a pleasant 57 year old gentleman with underlying bilateral knee osteoarthritis who comes in today for follow-up.  He has been complaining of chronic knee pain which is currently worse on the  left.  He has undergone cortisone injections in the past with only temporary relief.  We submitted for viscosupplementation injections at his last visit but unfortunately insurance did not approved.  Review of Systems as detailed in HPI.  All others reviewed and are negative.   Objective: Vital Signs: Ht 5\' 7"  (1.702 m)   Wt 270 lb (122.5 kg)   BMI 42.29 kg/m   Physical Exam well-developed well-nourished gentleman in no acute distress.  Alert and oriented x 3.  Ortho Exam unchanged bilateral knee exam  Specialty Comments:  No specialty comments available.  Imaging: No new imaging   PMFS History: Patient Active Problem List   Diagnosis Date Noted   Facet arthropathy, lumbar 04/17/2022   Varicose veins of right lower extremity with inflammation 08/17/2021   Dyslipidemia, goal LDL below 100 08/17/2021   Encounter for general adult medical examination with abnormal findings 02/14/2021   Chronic anticoagulation 01/22/2021   Mild intermittent asthma without complication 11/13/2020   Polyp of colon 11/13/2020   Hypogonadism male 02/10/2020   Erectile dysfunction due to arterial insufficiency 02/08/2020   Intrinsic eczema 02/08/2020   H. pylori infection 01/25/2020   Excessive somnolence disorder 05/09/2019   AC (acromioclavicular) arthritis 05/03/2019   Subacromial bursitis of right shoulder joint 05/03/2019   Ejection fraction < 50%  02/03/2019   Vitamin D deficiency disease 11/03/2018   PUD (peptic ulcer disease) 01/20/2018   Nephrolithiasis 08/26/2017   Morbid obesity (HCC) 08/25/2017   GERD with esophagitis 08/24/2017   Degenerative arthritis of knee, bilateral 06/08/2017   Venous insufficiency of both lower extremities 10/05/2015   Heterozygous factor V Leiden mutation (HCC) 08/24/2013   OSA (obstructive sleep apnea) 08/13/2013   Incomplete right bundle branch block 06/18/2011   Essential hypertension 07/06/2008   ERECTILE DYSFUNCTION, ORGANIC 01/28/2007   Past  Medical History:  Diagnosis Date   Anxiety    Arthritis    knees - no med   Cluster headache    Hx - resolved per patient   Depression    DVT of lower extremity (deep venous thrombosis) (HCC) 6/26-28/2013   Xarelto - resolved - history   HTN (hypertension)    currently no meds   Irritability and anger    PMH of   Kidney stone    passed stone, no surgery required.   Mild intermittent asthma without complication 11/13/2020   Rotator cuff arthropathy 2007   right   Saddle pulmonary embolus (HCC) 6/26-28/2013   post orthopedic surgery - resolved   Sleep apnea 09/2013   moderate OSA-has cpap but does not use it    Family History  Problem Relation Age of Onset   Cancer Mother        Mesothelioma    Diabetes Brother    Hypertension Brother    Stroke Brother 50   Heart attack Brother 55   Diabetes Maternal Aunt    Diabetes Maternal Grandmother    Heart disease Maternal Grandmother    Hypertension Maternal Grandmother    Stroke Maternal Grandmother        in 76s   Deep vein thrombosis Maternal Grandmother    Stroke Maternal Grandfather        > 55   Migraines Brother    Heart attack Brother 58   Healthy Father    Colon cancer Neg Hx    Esophageal cancer Neg Hx    Rectal cancer Neg Hx    Stomach cancer Neg Hx    Liver cancer Neg Hx    Pancreatic cancer Neg Hx    Other Neg Hx        low testosterone    Past Surgical History:  Procedure Laterality Date   arthroscopic knee surgery  10/09/2011   Dr Magnus Ivan   CARDIAC CATHETERIZATION  06/02/2005   LVdysfunction; normal coronaries   COLONOSCOPY     EXTRACORPOREAL SHOCK WAVE LITHOTRIPSY Left 09/05/2022   Procedure: EXTRACORPOREAL SHOCK WAVE LITHOTRIPSY (ESWL);  Surgeon: Jerilee Field, MD;  Location: [facility];  Service: Urology;  Laterality: Left;   EXTRACORPOREAL SHOCK WAVE LITHOTRIPSY Left 11/21/2022   Procedure: EXTRACORPOREAL SHOCK WAVE LITHOTRIPSY (ESWL);  Surgeon: Jerilee Field, MD;  Location:  [facility];  Service: Urology;  Laterality: Left;   KNEE ARTHROSCOPY WITH MEDIAL MENISECTOMY Left 09/20/2014   medial menisectomy chondroplastytella medial plica excision  ;  Surgeon: Jodi Geralds, MD;  Location: [facility];  Service: Orthopedics;  Laterality: Left;   LEFT HEART CATH AND CORONARY ANGIOGRAPHY N/A 10/17/2022   Procedure: LEFT HEART CATH AND CORONARY ANGIOGRAPHY;  Surgeon: [name], MD;  Location: [facility];  Service: Cardiovascular;  Laterality: N/A;   ROTATOR CUFF REPAIR  06/02/2005   right   UPPER GASTROINTESTINAL ENDOSCOPY  06/02/2005   normal   WISDOM TOOTH EXTRACTION  Social History   Occupational History   Occupation: Location manager    Occupation: Lobbyist  Tobacco Use   Smoking status: Former    Types: Cigars    Quit date: 10/04/2022    Years since quitting: 0.5    Passive exposure: Never   Smokeless tobacco: Never   Tobacco comments:    every 3-4 months  Vaping Use   Vaping status: Never Used  Substance and Sexual Activity   Alcohol use: Yes    Alcohol/week: 0.0 standard drinks of alcohol    Comment: drinks occasionally   Drug use: No   Sexual activity: Yes    Partners: Female    Birth control/protection: Condom

## 2023-04-15 ENCOUNTER — Telehealth: Payer: Self-pay | Admitting: *Deleted

## 2023-04-15 ENCOUNTER — Ambulatory Visit: Payer: No Typology Code available for payment source | Admitting: Nurse Practitioner

## 2023-04-15 NOTE — Telephone Encounter (Signed)
Pt has appt on 04/15/23- unable to pull any recent data from [facility] so can advise pt to bring SD card to appt.

## 2023-04-16 ENCOUNTER — Encounter: Payer: Self-pay | Admitting: Nurse Practitioner

## 2023-04-16 ENCOUNTER — Ambulatory Visit (INDEPENDENT_AMBULATORY_CARE_PROVIDER_SITE_OTHER): Payer: No Typology Code available for payment source | Admitting: Nurse Practitioner

## 2023-04-16 VITALS — BP 130/84 | HR 76 | Temp 97.9°F | Ht 67.0 in | Wt 269.8 lb

## 2023-04-16 DIAGNOSIS — G4733 Obstructive sleep apnea (adult) (pediatric): Secondary | ICD-10-CM | POA: Diagnosis not present

## 2023-04-16 NOTE — Progress Notes (Signed)
@Patient  ID: [name], male    DOB: 1965/06/17, 57 y.o.   MRN: [id number]  Chief Complaint  Patient presents with   Follow-up    Some fatigue during the late afternoon but improved from last OV in August 2024    Referring provider: [name], MD  HPI: 57 year old male, former smoker followed for OSA on CPAP and mild asthma. He is a patient of Dr. Reginia Naas and last seen in office 01/01/2023. Past medical history significant for HTN, RBBB, ED, angina, GERD, PUD, obesity, CHF, hx of saddle PE.   TEST/EVENTS:  09/2013 HST: AHI 24/h 01/2017 PFTs normal  12/31/2021: OV with Dr. Vassie Loll. Moderate OSA on CPAP. Does not feel fully rested and doesn't have as much energy. Wonders if CPAP needs to be adjusted. Doesn't always feel like he's getting enough air. Wife has not witnessed any apneas or snoring. Excellent compliance and good control on set pressure 12 cmH2O; will adjust him to auto 12-15 cmH2O and trial AirFit F30 fullface mask. His PCP had given him Trelegy to try but didn't feel like this helped. Felt as thought he didn't need this. Had him resume PRN SABA. Advised him to start asteline for allergic rhinitis and OTC nasal decongestant as needed.   01/01/2023: OV with Almalik Weissberg NP for follow up. He has been doing okay since he was here last. He was having difficulties with fatigue, SOB, diaphoresis, and chest discomfort that was evaluated by cardiology. He had LHC with mild CAD, without need for intervention. He was found to have LVEDP of 26-28 and therefore started on diuretics. He takes 20 mg 1-2 times daily depending on symptoms. He feels like this has helped with his symptoms. Breathing feels better. Only has to use albuterol during allergy season. No cough, chest congestion, wheezing.  Regarding his CPAP, he thinks he is due for a new machine. He's been having to tape his water chamber in so he would like to get a new one if able. He does feel like he's better rested, most nights, with the  change to full face mask and auto setting. He still has some days that he wakes up and feels more tired than others. He was also having some issues with mask leaking but changed his mask last night and slept better so he thinks this fixed the problem. No drowsy driving or morning headaches.   12/02/2022-12/31/2022: CPAP 12-15 cmH2O 28/30 days; 93% >4 hr; av use 7 hr 40 min Pressure 95th 13 Leaks 95th 17 AHI 1.6  04/16/2023: Today - follow up Patient presents today for follow up. Doing well on new CPAP for the most part. He is having some leaks and trouble with his mask. Feels better rested. Does have some fatigue after work. Sleeps well at night aside from the leaks. Denies any drowsy driving or sleep parasomnias/paralysis.   Download reviewed on phone 93% <4 hr; average use 5 hr 46 min Leaks 95th 31 AHI 1.43  No Known Allergies  Immunization History  Administered Date(s) Administered   Hepatitis A, Adult 08/28/2017, 01/20/2018   Influenza Whole 03/02/2012   Influenza, High Dose Seasonal PF 06/02/2017, 03/22/2019   Influenza,inj,Quad PF,6+ Mos 02/23/2019   Influenza-Unspecified 03/10/2013   PFIZER(Purple Top)SARS-COV-2 Vaccination 08/14/2019, 09/03/2019, 04/15/2020   PNEUMOCOCCAL CONJUGATE-20 11/13/2020   Tdap 06/18/2011, 08/14/2021   Zoster Recombinant(Shingrix) 08/14/2021, 12/16/2021    Past Medical History:  Diagnosis Date   Anxiety    Arthritis    knees - no med  Cluster headache    Hx - resolved per patient   Depression    DVT of lower extremity (deep venous thrombosis) (HCC) 6/26-28/2013   Xarelto - resolved - history   HTN (hypertension)    currently no meds   Irritability and anger    PMH of   Kidney stone    passed stone, no surgery required.   Mild intermittent asthma without complication 11/13/2020   Rotator cuff arthropathy 2007   right   Saddle pulmonary embolus (HCC) 6/26-28/2013   post orthopedic surgery - resolved   Sleep apnea 09/2013   moderate  OSA-has cpap but does not use it    Tobacco History: Social History   Tobacco Use  Smoking Status Former   Types: Cigars   Quit date: 10/04/2022   Years since quitting: 0.5   Passive exposure: Never  Smokeless Tobacco Never  Tobacco Comments   every 3-4 months   Counseling given: Not Answered Tobacco comments: every 3-4 months   Outpatient Medications Prior to Visit  Medication Sig Dispense Refill   Albuterol-Budesonide (AIRSUPRA) 90-80 MCG/ACT AERO Inhale 2 puffs into the lungs 4 (four) times daily as needed. 32.1 g 1   ALPRAZolam (XANAX) 0.5 MG tablet Take 1 tablet (0.5 mg total) by mouth 2 (two) times daily as needed for anxiety. 180 tablet 0   furosemide (LASIX) 20 MG tablet Take 1 tablet (20 mg total) by mouth 2 (two) times daily. Take 1 Tablet Daily. ( Can Take Additional Tablet As Needed). 180 tablet 0   ibuprofen (ADVIL) 800 MG tablet Take 1 tablet (800 mg total) by mouth 2 (two) times daily as needed. 60 tablet 3   Multiple Vitamin (MULTIVITAMIN WITH MINERALS) TABS tablet Take 1 tablet by mouth daily.     nitroGLYCERIN (NITROSTAT) 0.3 MG SL tablet Place 1 tablet (0.3 mg total) under the tongue 2 (two) times daily as needed. 90 tablet 0   rosuvastatin (CRESTOR) 40 MG tablet Take 1 tablet (40 mg total) by mouth daily. 90 tablet 3   tadalafil (CIALIS) 5 MG tablet Take 1 tablet (5 mg total) by mouth daily. 90 tablet 1   traMADol (ULTRAM) 50 MG tablet Take 1 tablet (50 mg total) by mouth every 12 (twelve) hours as needed. 30 tablet 2   Azelastine HCl 137 MCG/SPRAY SOLN PLACE 1 SPRAY INTO BOTH NOSTRILS AT BEDTIME. USE IN EACH NOSTRIL AS DIRECTED (Patient not taking: Reported on 04/16/2023) 90 mL 3   No facility-administered medications prior to visit.     Review of Systems:   Constitutional: No weight loss or gain, night sweats, fevers, chills, or lassitude. +occasional fatigue  HEENT: No headaches, difficulty swallowing, tooth/dental problems, or sore throat. No sneezing,  itching, ear ache, nasal congestion, or post nasal drip CV:  No chest pain, orthopnea, PND, swelling in lower extremities, anasarca, dizziness, palpitations, syncope Resp: +improved shortness of breath with exertion. No excess mucus or change in color of mucus. No productive or non-productive. No hemoptysis. No wheezing.  No chest wall deformity GI:  No heartburn, indigestion Skin: No rash, lesions, ulcerations MSK:  No joint pain or swelling.   Neuro: No dizziness or lightheadedness.  Psych: No depression or anxiety. Mood stable.     Physical Exam:  BP 130/84 (BP Location: Right Arm, Patient Position: Sitting, Cuff Size: Large)   Pulse 76   Temp 97.9 F (36.6 C) (Oral)   Ht 5\' 7"  (1.702 m)   Wt 269 lb 12.8 oz (122.4 kg)  SpO2 94%   BMI 42.26 kg/m   GEN: Pleasant, interactive, well-kempt; morbidly obese; in no acute distress. HEENT:  Normocephalic and atraumatic. PERRLA. Sclera white. Nasal turbinates pink, moist and patent bilaterally. No rhinorrhea present. Oropharynx pink and moist, without exudate or edema. No lesions, ulcerations, or postnasal drip.  NECK:  Supple w/ fair ROM. No JVD present. Normal carotid impulses w/o bruits. Thyroid symmetrical with no goiter or nodules palpated. No lymphadenopathy.   CV: RRR, no m/r/g, no peripheral edema. Pulses intact, +2 bilaterally. No cyanosis, pallor or clubbing. PULMONARY:  Unlabored, regular breathing. Clear bilaterally A&P w/o wheezes/rales/rhonchi. No accessory muscle use.  GI: BS present and normoactive. Soft, non-tender to palpation. No organomegaly or masses detected.  MSK: No erythema, warmth or tenderness. Cap refil <2 sec all extrem. No deformities or joint swelling noted.  Neuro: A/Ox3. No focal deficits noted.   Skin: Warm, no lesions or rashe Psych: Normal affect and behavior. Judgement and thought content appropriate.     Lab Results:  CBC    Component Value Date/Time   WBC 11.2 (H) 03/25/2023 1629   RBC 4.56  03/25/2023 1629   HGB 13.3 03/25/2023 1629   HGB 14.1 10/10/2022 1548   HCT 41.4 03/25/2023 1629   HCT 42.3 10/10/2022 1548   PLT 232.0 03/25/2023 1629   PLT 244 10/10/2022 1548   MCV 90.7 03/25/2023 1629   MCV 89 10/10/2022 1548   MCH 29.6 10/10/2022 1548   MCH 28.8 01/02/2021 1715   MCHC 32.3 03/25/2023 1629   RDW 13.8 03/25/2023 1629   RDW 12.8 10/10/2022 1548   LYMPHSABS 2.6 03/25/2023 1629   MONOABS 1.0 03/25/2023 1629   EOSABS 0.3 03/25/2023 1629   BASOSABS 0.1 03/25/2023 1629    BMET    Component Value Date/Time   NA 141 03/25/2023 1629   NA 143 10/24/2022 1223   K 4.5 03/25/2023 1629   CL 106 03/25/2023 1629   CO2 29 03/25/2023 1629   GLUCOSE 86 03/25/2023 1629   BUN 11 03/25/2023 1629   BUN 10 10/24/2022 1223   CREATININE 0.86 03/25/2023 1629   CREATININE 0.69 (L) 12/22/2019 1644   CALCIUM 9.6 03/25/2023 1629   GFRNONAA >60 01/02/2021 1715   GFRNONAA 108 12/22/2019 1644   GFRAA 115 09/04/2020 0000   GFRAA 125 12/22/2019 1644    BNP    Component Value Date/Time   BNP 15.2 03/08/2018 2033     Imaging:  No results found.  bupivacaine (MARCAINE) 0.25 % (with pres) injection 0.66 mL     Date Action Dose Route User   04/14/2023 1558 Given 0.66 mL Intra-articular (Left Knee) Cristie Hem, PA-C      bupivacaine (MARCAINE) 0.25 % (with pres) injection 0.66 mL     Date Action Dose Route User   04/14/2023 1558 Given 0.66 mL Intra-articular (Right Knee) Cristie Hem, PA-C      lidocaine (XYLOCAINE) 1 % (with pres) injection 3 mL     Date Action Dose Route User   04/14/2023 1558 Given 3 mL Other (Left Knee) Cristie Hem, PA-C      lidocaine (XYLOCAINE) 1 % (with pres) injection 3 mL     Date Action Dose Route User   04/14/2023 1558 Given 3 mL Other (Right Knee) Cristie Hem, PA-C      methylPREDNISolone acetate (DEPO-MEDROL) injection 13.33 mg     Date Action Dose Route User   04/14/2023 1558 Given 13.33 mg Intra-articular  (Left Knee) Jari Sportsman  L, PA-C      methylPREDNISolone acetate (DEPO-MEDROL) injection 13.33 mg     Date Action Dose Route User   04/14/2023 1558 Given 13.33 mg Intra-articular (Right Knee) Cristie Hem, PA-C          Latest Ref Rng & Units 01/20/2017    4:45 PM  PFT Results  FVC-Pre L 4.14   FVC-Predicted Pre % 110   FVC-Post L 4.10   FVC-Predicted Post % 109   Pre FEV1/FVC % % 90   Post FEV1/FCV % % 91   FEV1-Pre L 3.74   FEV1-Predicted Pre % 125   FEV1-Post L 3.72   DLCO uncorrected ml/min/mmHg 26.27   DLCO UNC% % 92   DLCO corrected ml/min/mmHg 24.74   DLCO COR %Predicted % 87   DLVA Predicted % 103   TLC L 5.37   TLC % Predicted % 84   RV % Predicted % 67     No results found for: "NITRICOXIDE"      Assessment & Plan:   OSA (obstructive sleep apnea) Moderate OSA on CPAP.  Good compliance and control.  Having some difficulties with mask fit and occasional leaks.  Average 13 cmH2O.  Currently set on auto CPAP 12 to 15 cmH2O.  Will adjust him down to 8 to 13 cmH2O. Change to ResMed F30 nasal cradle mask with chinstrap.  Advised to call if no improvement.  Aware of proper care/use of device.  Encouraged to continue using nightly.  Aware of safe driving practices.  Healthy weight loss encouraged.  Reassess at follow-up.  Patient Instructions  Continue to use CPAP every night, minimum of 4-6 hours a night.  Change equipment as directed. Wash your tubing with warm soap and water daily, hang to dry. Wash humidifier portion weekly. Use bottled, distilled water and change daily Be aware of reduced alertness and do not drive or operate heavy machinery if experiencing this or drowsiness.  Exercise encouraged, as tolerated. Healthy weight management discussed.  Avoid or decrease alcohol consumption and medications that make you more sleepy, if possible. Notify if persistent daytime sleepiness occurs even with consistent use of PAP therapy.  Change to ResMed  AirFit N30 nasal cradle mask with a chin strap   Adjust CPAP settings to 8-13 cmH2O  Follow up in 3 months with Dr. Vassie Loll or Katie Darelle Kings,NP. If symptoms do not improve or worsen, please contact office for sooner follow up     I spent 32 minutes of dedicated to the care of this patient on the date of this encounter to include pre-visit review of records, face-to-face time with the patient discussing conditions above, post visit ordering of testing, clinical documentation with the electronic health record, making appropriate referrals as documented, and communicating necessary findings to members of the patients care team.  Noemi Chapel, NP 04/17/2023  Pt aware and understands NP's role.

## 2023-04-16 NOTE — Patient Instructions (Addendum)
Continue to use CPAP every night, minimum of 4-6 hours a night.  Change equipment as directed. Wash your tubing with warm soap and water daily, hang to dry. Wash humidifier portion weekly. Use bottled, distilled water and change daily Be aware of reduced alertness and do not drive or operate heavy machinery if experiencing this or drowsiness.  Exercise encouraged, as tolerated. Healthy weight management discussed.  Avoid or decrease alcohol consumption and medications that make you more sleepy, if possible. Notify if persistent daytime sleepiness occurs even with consistent use of PAP therapy.  Change to ResMed AirFit N30 nasal cradle mask with a chin strap   Adjust CPAP settings to 8-13 cmH2O  Follow up in 3 months with Dr. Vassie Loll or Katie Minsa Weddington,NP. If symptoms do not improve or worsen, please contact office for sooner follow up

## 2023-04-17 ENCOUNTER — Encounter: Payer: Self-pay | Admitting: Nurse Practitioner

## 2023-04-17 NOTE — Assessment & Plan Note (Signed)
Moderate OSA on CPAP.  Good compliance and control.  Having some difficulties with mask fit and occasional leaks.  Average 13 cmH2O.  Currently set on auto CPAP 12 to 15 cmH2O.  Will adjust him down to 8 to 13 cmH2O. Change to ResMed F30 nasal cradle mask with chinstrap.  Advised to call if no improvement.  Aware of proper care/use of device.  Encouraged to continue using nightly.  Aware of safe driving practices.  Healthy weight loss encouraged.  Reassess at follow-up.  Patient Instructions  Continue to use CPAP every night, minimum of 4-6 hours a night.  Change equipment as directed. Wash your tubing with warm soap and water daily, hang to dry. Wash humidifier portion weekly. Use bottled, distilled water and change daily Be aware of reduced alertness and do not drive or operate heavy machinery if experiencing this or drowsiness.  Exercise encouraged, as tolerated. Healthy weight management discussed.  Avoid or decrease alcohol consumption and medications that make you more sleepy, if possible. Notify if persistent daytime sleepiness occurs even with consistent use of PAP therapy.  Change to ResMed AirFit N30 nasal cradle mask with a chin strap   Adjust CPAP settings to 8-13 cmH2O  Follow up in 3 months with Dr. Vassie Loll or Katie Natasia Sanko,NP. If symptoms do not improve or worsen, please contact office for sooner follow up

## 2023-04-23 ENCOUNTER — Ambulatory Visit: Payer: No Typology Code available for payment source | Admitting: Family Medicine

## 2023-04-24 NOTE — Telephone Encounter (Signed)
Pt seen in clinic and the DL was reviewed.

## 2023-05-26 ENCOUNTER — Other Ambulatory Visit: Payer: Self-pay | Admitting: Internal Medicine

## 2023-05-26 DIAGNOSIS — E785 Hyperlipidemia, unspecified: Secondary | ICD-10-CM

## 2023-06-09 NOTE — Progress Notes (Signed)
 [name] [address] [place] [address] Phone: [phone] Subjective:    I'm seeing this patient by the request  of:  [name] Debby CROME, MD  CC: Knee and back pain  YEP:Dlagzrupcz  02/24/2023 Severe end stage  Discussed HEP Discussed which activities to do and which ones to avoid.       Update 06/10/2023 [name] is a 58 y.o. male coming in with complaint of B knee pain. Patient states ready for the knee injections    Patient was seen in November by orthopedic surgery and discussed getting patient's weight under 250 pounds before knee replacement.  Given injections in the knees 2 months ago by another provider.  Given tramadol  by this provider as well. Past Medical History:  Diagnosis Date   Anxiety    Arthritis    knees - no med   Cluster headache    Hx - resolved per patient   Depression    DVT of lower extremity (deep venous thrombosis) (HCC) 6/26-28/2013   Xarelto  - resolved - history   HTN (hypertension)    currently no meds   Irritability and anger    PMH of   Kidney stone    passed stone, no surgery required.   Mild intermittent asthma without complication 11/13/2020   Rotator cuff arthropathy 2007   right   Saddle pulmonary embolus (HCC) 6/26-28/2013   post orthopedic surgery - resolved   Sleep apnea 09/2013   moderate OSA-has cpap but does not use it   Past Surgical History:  Procedure Laterality Date   arthroscopic knee surgery  10/09/2011   Dr Vernetta   CARDIAC CATHETERIZATION  06/02/2005   LVdysfunction; normal coronaries   COLONOSCOPY     EXTRACORPOREAL SHOCK WAVE LITHOTRIPSY Left 09/05/2022   Procedure: EXTRACORPOREAL SHOCK WAVE LITHOTRIPSY (ESWL);  Surgeon: Nieves Cough, MD;  Location: [facility];  Service: Urology;  Laterality: Left;   EXTRACORPOREAL SHOCK WAVE LITHOTRIPSY Left 11/21/2022   Procedure: EXTRACORPOREAL SHOCK WAVE LITHOTRIPSY (ESWL);  Surgeon: Nieves Cough, MD;   Location: [facility];  Service: Urology;  Laterality: Left;   KNEE ARTHROSCOPY WITH MEDIAL MENISECTOMY Left 09/20/2014   medial menisectomy chondroplastytella medial plica excision  ;  Surgeon: Norleen Gavel, MD;  Location: [facility];  Service: Orthopedics;  Laterality: Left;   LEFT HEART CATH AND CORONARY ANGIOGRAPHY N/A 10/17/2022   Procedure: LEFT HEART CATH AND CORONARY ANGIOGRAPHY;  Surgeon: Wendel Lurena POUR, MD;  Location: [facility];  Service: Cardiovascular;  Laterality: N/A;   ROTATOR CUFF REPAIR  06/02/2005   right   UPPER GASTROINTESTINAL ENDOSCOPY  06/02/2005   normal   WISDOM TOOTH EXTRACTION     Social History   Socioeconomic History   Marital status: Married    Spouse name: Not on file   Number of children: 6   Years of education: 11   Highest education level: Not on file  Occupational History   Occupation: Location manager    Occupation: Lobbyist  Tobacco Use   Smoking status: Former    Types: Cigars    Quit date: 10/04/2022    Years since quitting: 0.6    Passive exposure: Never   Smokeless tobacco: Never   Tobacco comments:    every 3-4 months  Vaping Use   Vaping status: Never Used  Substance and Sexual Activity   Alcohol use: Yes    Alcohol/week: 0.0 standard drinks of alcohol  Comment: drinks occasionally   Drug use: No   Sexual activity: Yes    Partners: Female    Birth control/protection: Condom  Other Topics Concern   Not on file  Social History Narrative   Fun: Exercise   Social Drivers of Health   Financial Resource Strain: Not on file  Food Insecurity: Not on file  Transportation Needs: Not on file  Physical Activity: Not on file  Stress: Not on file  Social Connections: Unknown (10/11/2021)   Received from [employer], [facility]   Social Network    Social Network: Not on file   No Known Allergies Family History  Problem Relation Age of Onset   Cancer Mother        Mesothelioma     Diabetes Brother    Hypertension Brother    Stroke Brother 50   Heart attack Brother 55   Diabetes Maternal Aunt    Diabetes Maternal Grandmother    Heart disease Maternal Grandmother    Hypertension Maternal Grandmother    Stroke Maternal Grandmother        in 19s   Deep vein thrombosis Maternal Grandmother    Stroke Maternal Grandfather        > 55   Migraines Brother    Heart attack Brother 96   Healthy Father    Colon cancer Neg Hx    Esophageal cancer Neg Hx    Rectal cancer Neg Hx    Stomach cancer Neg Hx    Liver cancer Neg Hx    Pancreatic cancer Neg Hx    Other Neg Hx        low testosterone      Current Outpatient Medications (Cardiovascular):    furosemide  (LASIX ) 20 MG tablet, Take 1 tablet (20 mg total) by mouth 2 (two) times daily. Take 1 Tablet Daily. ( Can Take Additional Tablet As Needed).   nitroGLYCERIN  (NITROSTAT ) 0.3 MG SL tablet, Place 1 tablet (0.3 mg total) under the tongue 2 (two) times daily as needed.   rosuvastatin  (CRESTOR ) 40 MG tablet, Take 1 tablet (40 mg total) by mouth daily.   tadalafil  (CIALIS ) 5 MG tablet, Take 1 tablet (5 mg total) by mouth daily.  Current Outpatient Medications (Respiratory):    Albuterol -Budesonide  (AIRSUPRA ) 90-80 MCG/ACT AERO, Inhale 2 puffs into the lungs 4 (four) times daily as needed.   Azelastine  HCl 137 MCG/SPRAY SOLN, PLACE 1 SPRAY INTO BOTH NOSTRILS AT BEDTIME. USE IN EACH NOSTRIL AS DIRECTED (Patient not taking: Reported on 06/10/2023)  Current Outpatient Medications (Analgesics):    ibuprofen  (ADVIL ) 800 MG tablet, Take 1 tablet (800 mg total) by mouth 2 (two) times daily as needed.   traMADol  (ULTRAM ) 50 MG tablet, Take 1 tablet (50 mg total) by mouth every 12 (twelve) hours as needed.   Current Outpatient Medications (Other):    ALPRAZolam  (XANAX ) 0.5 MG tablet, Take 1 tablet (0.5 mg total) by mouth 2 (two) times daily as needed for anxiety.   Multiple Vitamin (MULTIVITAMIN WITH MINERALS) TABS tablet,  Take 1 tablet by mouth daily.   Reviewed prior external information including notes and imaging from  primary care provider As well as notes that were available from care everywhere and other healthcare systems.  Past medical history, social, surgical and family history all reviewed in electronic medical record.  No pertanent information unless stated regarding to the chief complaint.   Review of Systems:  No headache, visual changes, nausea, vomiting, diarrhea, constipation, dizziness, abdominal pain, skin rash, fevers, chills,  night sweats, weight loss, swollen lymph nodes, body aches,  chest pain, shortness of breath, mood changes. POSITIVE muscle aches, joint swelling  Objective  Blood pressure 138/80, pulse 81, height 5' 7 (1.702 m), SpO2 98%.   General: No apparent distress alert and oriented x3 mood and affect normal, dressed appropriately.  HEENT: Pupils equal, extraocular movements intact  Respiratory: Patient's speak in full sentences and does not appear short of breath  Cardiovascular: No lower extremity edema, non tender, no erythema  Bilateral knee exam shows some degenerative arthritic changes noted.  Effusion noted of the knees bilaterally.  Lacks full extension and flexion of the knees. \  After informed written and verbal consent, patient was seated on exam table. Right knee was prepped with alcohol swab and utilizing anterolateral approach, patient's right knee space was injected with 4:1  marcaine  0.5%: Kenalog  40mg /dL. Patient tolerated the procedure well without immediate complications.  After informed written and verbal consent, patient was seated on exam table. Left knee was prepped with alcohol swab and utilizing anterolateral approach, patient's left knee space was injected with 4:1  marcaine  0.5%: Kenalog  40mg /dL. Patient tolerated the procedure well without immediate complications.    Impression and Recommendations:    The above documentation has been reviewed  and is accurate and complete Anadia Helmes M Gabrille Kilbride, DO

## 2023-06-10 ENCOUNTER — Encounter: Payer: Self-pay | Admitting: Family Medicine

## 2023-06-10 ENCOUNTER — Ambulatory Visit (INDEPENDENT_AMBULATORY_CARE_PROVIDER_SITE_OTHER): Payer: No Typology Code available for payment source | Admitting: Family Medicine

## 2023-06-10 VITALS — BP 138/80 | HR 81 | Ht 67.0 in

## 2023-06-10 DIAGNOSIS — M170 Bilateral primary osteoarthritis of knee: Secondary | ICD-10-CM | POA: Diagnosis not present

## 2023-06-10 NOTE — Patient Instructions (Signed)
 Good to see you  Injections in knees today   Follow up in 3 months

## 2023-06-10 NOTE — Assessment & Plan Note (Signed)
 Chronic problem with worsening symptoms.  Discussed icing regimen and home exercises, discussed which activities to do and which ones to avoid.  Patient is still attempting to lose weight.  Needs to get weight under 250 pounds so he would be a candidate for replacement.  Has done very well over the course the last several months.  Encouraged him to continue to be active.  Follow-up again in 6 to 8 weeks otherwise.

## 2023-07-13 NOTE — Progress Notes (Signed)
Office Visit Note   Patient: [name]           Date of Birth: 11-Nov-1965           MRN: [id number] Visit Date: 07/14/2023              Requested by: [name], MD [address] [place],  [place] [address] PCP: [name], MD   Assessment & Plan: Visit Diagnoses:  1. Primary osteoarthritis of both knees     Plan: [name] is a 58 year old gentleman with bilateral DJD of his knees with varus deformities.  We did cortisone injections in both knees in November but he also had bilateral injections last month with Dr. Katrinka Blazing.  This shows me that he is not getting enough relief from these injections and it would be too soon to do another round of injections today.  We had a discussion about weight loss and he needs to be 255 pounds or less to achieve a BMI of less than 40.  Follow-Up Instructions: No follow-ups on file.   Orders:  No orders of the defined types were placed in this encounter.  No orders of the defined types were placed in this encounter.     Procedures: No procedures performed   Clinical Data: No additional findings.   Subjective: Chief Complaint  Patient presents with   Left Knee - Pain   Right Knee - Pain    HPI [name] returns today for follow-up evaluation of bilateral knee osteoarthritis. Review of Systems   Objective: Vital Signs: There were no vitals taken for this visit.  Physical Exam  Ortho Exam Examinations of bilateral knees are unchanged from prior visit. Specialty Comments:  No specialty comments available.  Imaging: No results found.   PMFS History: Patient Active Problem List   Diagnosis Date Noted   Facet arthropathy, lumbar 04/17/2022   Varicose veins of right lower extremity with inflammation 08/17/2021   Dyslipidemia, goal LDL below 100 08/17/2021   Encounter for general adult medical examination with abnormal findings 02/14/2021   Chronic anticoagulation 01/22/2021   Mild intermittent  asthma without complication 11/13/2020   Polyp of colon 11/13/2020   Hypogonadism male 02/10/2020   Erectile dysfunction due to arterial insufficiency 02/08/2020   Intrinsic eczema 02/08/2020   H. pylori infection 01/25/2020   Excessive somnolence disorder 05/09/2019   AC (acromioclavicular) arthritis 05/03/2019   Subacromial bursitis of right shoulder joint 05/03/2019   Ejection fraction < 50% 02/03/2019   Vitamin D deficiency disease 11/03/2018   PUD (peptic ulcer disease) 01/20/2018   Nephrolithiasis 08/26/2017   Morbid obesity (HCC) 08/25/2017   GERD with esophagitis 08/24/2017   Degenerative arthritis of knee, bilateral 06/08/2017   Venous insufficiency of both lower extremities 10/05/2015   Heterozygous factor V Leiden mutation (HCC) 08/24/2013   OSA (obstructive sleep apnea) 08/13/2013   Incomplete right bundle branch block 06/18/2011   Essential hypertension 07/06/2008   ERECTILE DYSFUNCTION, ORGANIC 01/28/2007   Past Medical History:  Diagnosis Date   Anxiety    Arthritis    knees - no med   Cluster headache    Hx - resolved per patient   Depression    DVT of lower extremity (deep venous thrombosis) (HCC) 6/26-28/2013   Xarelto - resolved - history   HTN (hypertension)    currently no meds   Irritability and anger    PMH of   Kidney stone    passed stone, no surgery required.  Mild intermittent asthma without complication 11/13/2020   Rotator cuff arthropathy 2007   right   Saddle pulmonary embolus (HCC) 6/26-28/2013   post orthopedic surgery - resolved   Sleep apnea 09/2013   moderate OSA-has cpap but does not use it    Family History  Problem Relation Age of Onset   Cancer Mother        Mesothelioma    Diabetes Brother    Hypertension Brother    Stroke Brother 50   Heart attack Brother 55   Diabetes Maternal Aunt    Diabetes Maternal Grandmother    Heart disease Maternal Grandmother    Hypertension Maternal Grandmother    Stroke Maternal  Grandmother        in 12s   Deep vein thrombosis Maternal Grandmother    Stroke Maternal Grandfather        > 55   Migraines Brother    Heart attack Brother 106   Healthy Father    Colon cancer Neg Hx    Esophageal cancer Neg Hx    Rectal cancer Neg Hx    Stomach cancer Neg Hx    Liver cancer Neg Hx    Pancreatic cancer Neg Hx    Other Neg Hx        low testosterone    Past Surgical History:  Procedure Laterality Date   arthroscopic knee surgery  10/09/2011   Dr Magnus Ivan   CARDIAC CATHETERIZATION  06/02/2005   LVdysfunction; normal coronaries   COLONOSCOPY     EXTRACORPOREAL SHOCK WAVE LITHOTRIPSY Left 09/05/2022   Procedure: EXTRACORPOREAL SHOCK WAVE LITHOTRIPSY (ESWL);  Surgeon: Jerilee Field, MD;  Location: [facility];  Service: Urology;  Laterality: Left;   EXTRACORPOREAL SHOCK WAVE LITHOTRIPSY Left 11/21/2022   Procedure: EXTRACORPOREAL SHOCK WAVE LITHOTRIPSY (ESWL);  Surgeon: Jerilee Field, MD;  Location: [facility];  Service: Urology;  Laterality: Left;   KNEE ARTHROSCOPY WITH MEDIAL MENISECTOMY Left 09/20/2014   medial menisectomy chondroplastytella medial plica excision  ;  Surgeon: Jodi Geralds, MD;  Location: [facility];  Service: Orthopedics;  Laterality: Left;   LEFT HEART CATH AND CORONARY ANGIOGRAPHY N/A 10/17/2022   Procedure: LEFT HEART CATH AND CORONARY ANGIOGRAPHY;  Surgeon: [name], MD;  Location: [facility];  Service: Cardiovascular;  Laterality: N/A;   ROTATOR CUFF REPAIR  06/02/2005   right   UPPER GASTROINTESTINAL ENDOSCOPY  06/02/2005   normal   WISDOM TOOTH EXTRACTION     Social History   Occupational History   Occupation: Location manager    Occupation: Lobbyist  Tobacco Use   Smoking status: Former    Types: Cigars    Quit date: 10/04/2022    Years since quitting: 0.7    Passive exposure: Never   Smokeless tobacco: Never   Tobacco comments:    every 3-4 months   Vaping Use   Vaping status: Never Used  Substance and Sexual Activity   Alcohol use: Yes    Alcohol/week: 0.0 standard drinks of alcohol    Comment: drinks occasionally   Drug use: No   Sexual activity: Yes    Partners: Female    Birth control/protection: Condom

## 2023-07-14 ENCOUNTER — Ambulatory Visit (INDEPENDENT_AMBULATORY_CARE_PROVIDER_SITE_OTHER): Payer: No Typology Code available for payment source | Admitting: Orthopaedic Surgery

## 2023-07-14 DIAGNOSIS — M170 Bilateral primary osteoarthritis of knee: Secondary | ICD-10-CM | POA: Diagnosis not present

## 2023-07-14 DIAGNOSIS — Z6841 Body Mass Index (BMI) 40.0 and over, adult: Secondary | ICD-10-CM

## 2023-07-20 ENCOUNTER — Encounter: Payer: Self-pay | Admitting: Nurse Practitioner

## 2023-07-20 ENCOUNTER — Telehealth: Payer: Self-pay | Admitting: Nurse Practitioner

## 2023-07-20 ENCOUNTER — Ambulatory Visit (INDEPENDENT_AMBULATORY_CARE_PROVIDER_SITE_OTHER): Payer: No Typology Code available for payment source | Admitting: Nurse Practitioner

## 2023-07-20 VITALS — BP 136/84 | HR 94 | Temp 98.3°F | Ht 67.0 in | Wt 265.0 lb

## 2023-07-20 DIAGNOSIS — G4733 Obstructive sleep apnea (adult) (pediatric): Secondary | ICD-10-CM | POA: Diagnosis not present

## 2023-07-20 DIAGNOSIS — J4530 Mild persistent asthma, uncomplicated: Secondary | ICD-10-CM | POA: Diagnosis not present

## 2023-07-20 DIAGNOSIS — J309 Allergic rhinitis, unspecified: Secondary | ICD-10-CM | POA: Diagnosis not present

## 2023-07-20 MED ORDER — FLUTICASONE PROPIONATE 50 MCG/ACT NA SUSP: 2.0000 | Freq: Every day | NASAL | 2 refills | Status: DC

## 2023-07-20 MED ORDER — BUDESONIDE-FORMOTEROL FUMARATE 160-4.5 MCG/ACT IN AERO
2.0000 | INHALATION_SPRAY | Freq: Two times a day (BID) | RESPIRATORY_TRACT | 6 refills | Status: DC
Start: 2023-07-20 — End: 2024-01-20

## 2023-07-20 NOTE — Patient Instructions (Addendum)
Continue to use CPAP every night, minimum of 4-6 hours a night.  Change equipment as directed. Wash your tubing with warm soap and water daily, hang to dry. Wash humidifier portion weekly. Use bottled, distilled water and change daily Be aware of reduced alertness and do not drive or operate heavy machinery if experiencing this or drowsiness.  Exercise encouraged, as tolerated. Healthy weight management discussed.  Avoid or decrease alcohol consumption and medications that make you more sleepy, if possible. Notify if persistent daytime sleepiness occurs even with consistent use of PAP therapy.   Continue aisupra 2 puffs every 6 hours as needed for shortness of breath or wheezing. Notify if symptoms persist despite rescue inhaler/neb use.  Start Symbicort 2 puffs Twice daily. Brush tongue and rinse mouth afterwards. This will be your new daily inhaler that you will use regardless of symptoms  Flonase nasal spray 2 sprays each nostril daily  Allergen panel today    Follow up in 6 weeks with Dr. Vassie Loll or Philis Nettle. If symptoms do not improve or worsen, please contact office for sooner follow up

## 2023-07-20 NOTE — Telephone Encounter (Signed)
PT needed 4:00 appt.Ms. Allison Quarry wanted to see him in 6 weeks. I had to sched out to May to get a later appt. Please ask Florentina Addison if she would double book. TY.

## 2023-07-20 NOTE — Progress Notes (Unsigned)
@Patient  ID: [name], male    DOB: 07-Jul-1965, 58 y.o.   MRN: [id number]  Chief Complaint  Patient presents with   Follow-up    Referring provider: [name], MD  HPI: 58 year old male, former smoker followed for OSA on CPAP and mild asthma. He is a patient of Dr. Reginia Naas and last seen in office 01/01/2023. Past medical history significant for HTN, RBBB, ED, angina, GERD, PUD, obesity, CHF, hx of saddle PE.   TEST/EVENTS:  09/2013 HST: AHI 24/h 01/2017 PFTs normal  12/31/2021: OV with Dr. Vassie Loll. Moderate OSA on CPAP. Does not feel fully rested and doesn't have as much energy. Wonders if CPAP needs to be adjusted. Doesn't always feel like he's getting enough air. Wife has not witnessed any apneas or snoring. Excellent compliance and good control on set pressure 12 cmH2O; will adjust him to auto 12-15 cmH2O and trial AirFit F30 fullface mask. His PCP had given him Trelegy to try but didn't feel like this helped. Felt as thought he didn't need this. Had him resume PRN SABA. Advised him to start asteline for allergic rhinitis and OTC nasal decongestant as needed.   01/01/2023: OV with Adelheid Hoggard NP for follow up. He has been doing okay since he was here last. He was having difficulties with fatigue, SOB, diaphoresis, and chest discomfort that was evaluated by cardiology. He had LHC with mild CAD, without need for intervention. He was found to have LVEDP of 26-28 and therefore started on diuretics. He takes 20 mg 1-2 times daily depending on symptoms. He feels like this has helped with his symptoms. Breathing feels better. Only has to use albuterol during allergy season. No cough, chest congestion, wheezing.  Regarding his CPAP, he thinks he is due for a new machine. He's been having to tape his water chamber in so he would like to get a new one if able. He does feel like he's better rested, most nights, with the change to full face mask and auto setting. He still has some days that he wakes up and  feels more tired than others. He was also having some issues with mask leaking but changed his mask last night and slept better so he thinks this fixed the problem. No drowsy driving or morning headaches.   12/02/2022-12/31/2022: CPAP 12-15 cmH2O 28/30 days; 93% >4 hr; av use 7 hr 40 min Pressure 95th 13 Leaks 95th 17 AHI 1.6  04/16/2023: Today - follow up Patient presents today for follow up. Doing well on new CPAP for the most part. He is having some leaks and trouble with his mask. Feels better rested. Does have some fatigue after work. Sleeps well at night aside from the leaks. Denies any drowsy driving or sleep parasomnias/paralysis.   Download reviewed on phone 93% >4 hr; average use 5 hr 46 min Leaks 95th 31 AHI 1.43  07/20/2023: Today -  Some cough at night; has more trouble with his breathing at night but does get some chest tightness and wheezing during the day   06/20/2023-07/19/2023: CPAP 12-15 cmH2O 28/30 days; 90% >4 hr; average use 5 hr 57 min Pressure 95th 14 Leaks 95th 18.9 AHI 1.5   No Known Allergies  Immunization History  Administered Date(s) Administered   Hepatitis A, Adult 08/28/2017, 01/20/2018   Influenza Whole 03/02/2012   Influenza, High Dose Seasonal PF 06/02/2017, 03/22/2019   Influenza,inj,Quad PF,6+ Mos 02/23/2019   Influenza-Unspecified 03/10/2013   PFIZER(Purple Top)SARS-COV-2 Vaccination 08/14/2019, 09/03/2019, 04/15/2020  PNEUMOCOCCAL CONJUGATE-20 11/13/2020   Tdap 06/18/2011, 08/14/2021   Zoster Recombinant(Shingrix) 08/14/2021, 12/16/2021    Past Medical History:  Diagnosis Date   Anxiety    Arthritis    knees - no med   Cluster headache    Hx - resolved per patient   Depression    DVT of lower extremity (deep venous thrombosis) (HCC) 6/26-28/2013   Xarelto - resolved - history   HTN (hypertension)    currently no meds   Irritability and anger    PMH of   Kidney stone    passed stone, no surgery required.   Mild intermittent  asthma without complication 11/13/2020   Rotator cuff arthropathy 2007   right   Saddle pulmonary embolus (HCC) 6/26-28/2013   post orthopedic surgery - resolved   Sleep apnea 09/2013   moderate OSA-has cpap but does not use it    Tobacco History: Social History   Tobacco Use  Smoking Status Former   Types: Cigars   Quit date: 10/04/2022   Years since quitting: 0.7   Passive exposure: Never  Smokeless Tobacco Never  Tobacco Comments   every 3-4 months   Counseling given: Not Answered Tobacco comments: every 3-4 months   Outpatient Medications Prior to Visit  Medication Sig Dispense Refill   Albuterol-Budesonide (AIRSUPRA) 90-80 MCG/ACT AERO Inhale 2 puffs into the lungs 4 (four) times daily as needed. 32.1 g 1   ALPRAZolam (XANAX) 0.5 MG tablet Take 1 tablet (0.5 mg total) by mouth 2 (two) times daily as needed for anxiety. 180 tablet 0   furosemide (LASIX) 20 MG tablet Take 1 tablet (20 mg total) by mouth 2 (two) times daily. Take 1 Tablet Daily. ( Can Take Additional Tablet As Needed). 180 tablet 0   ibuprofen (ADVIL) 800 MG tablet Take 1 tablet (800 mg total) by mouth 2 (two) times daily as needed. 60 tablet 3   Multiple Vitamin (MULTIVITAMIN WITH MINERALS) TABS tablet Take 1 tablet by mouth daily.     nitroGLYCERIN (NITROSTAT) 0.3 MG SL tablet Place 1 tablet (0.3 mg total) under the tongue 2 (two) times daily as needed. 90 tablet 0   rosuvastatin (CRESTOR) 40 MG tablet Take 1 tablet (40 mg total) by mouth daily. 90 tablet 3   tadalafil (CIALIS) 5 MG tablet Take 1 tablet (5 mg total) by mouth daily. 90 tablet 1   traMADol (ULTRAM) 50 MG tablet Take 1 tablet (50 mg total) by mouth every 12 (twelve) hours as needed. 30 tablet 2   Azelastine HCl 137 MCG/SPRAY SOLN PLACE 1 SPRAY INTO BOTH NOSTRILS AT BEDTIME. USE IN EACH NOSTRIL AS DIRECTED (Patient not taking: Reported on 04/16/2023) 90 mL 3   No facility-administered medications prior to visit.     Review of Systems:    Constitutional: No weight loss or gain, night sweats, fevers, chills, or lassitude. +occasional fatigue  HEENT: No headaches, difficulty swallowing, tooth/dental problems, or sore throat. No sneezing, itching, ear ache, nasal congestion, or post nasal drip CV:  No chest pain, orthopnea, PND, swelling in lower extremities, anasarca, dizziness, palpitations, syncope Resp: +improved shortness of breath with exertion. No excess mucus or change in color of mucus. No productive or non-productive. No hemoptysis. No wheezing.  No chest wall deformity GI:  No heartburn, indigestion Skin: No rash, lesions, ulcerations MSK:  No joint pain or swelling.   Neuro: No dizziness or lightheadedness.  Psych: No depression or anxiety. Mood stable.     Physical Exam:  BP 136/84 (BP  Location: Right Arm, Patient Position: Sitting, Cuff Size: Large)   Pulse 94   Temp 98.3 F (36.8 C) (Oral)   Ht 5\' 7"  (1.702 m)   Wt 265 lb (120.2 kg)   SpO2 95%   BMI 41.50 kg/m   GEN: Pleasant, interactive, well-kempt; morbidly obese; in no acute distress. HEENT:  Normocephalic and atraumatic. PERRLA. Sclera white. Nasal turbinates pink, moist and patent bilaterally. No rhinorrhea present. Oropharynx pink and moist, without exudate or edema. No lesions, ulcerations, or postnasal drip.  NECK:  Supple w/ fair ROM. No JVD present. Normal carotid impulses w/o bruits. Thyroid symmetrical with no goiter or nodules palpated. No lymphadenopathy.   CV: RRR, no m/r/g, no peripheral edema. Pulses intact, +2 bilaterally. No cyanosis, pallor or clubbing. PULMONARY:  Unlabored, regular breathing. Clear bilaterally A&P w/o wheezes/rales/rhonchi. No accessory muscle use.  GI: BS present and normoactive. Soft, non-tender to palpation. No organomegaly or masses detected.  MSK: No erythema, warmth or tenderness. Cap refil <2 sec all extrem. No deformities or joint swelling noted.  Neuro: A/Ox3. No focal deficits noted.   Skin: Warm, no  lesions or rashe Psych: Normal affect and behavior. Judgement and thought content appropriate.     Lab Results:  CBC    Component Value Date/Time   WBC 11.2 (H) 03/25/2023 1629   RBC 4.56 03/25/2023 1629   HGB 13.3 03/25/2023 1629   HGB 14.1 10/10/2022 1548   HCT 41.4 03/25/2023 1629   HCT 42.3 10/10/2022 1548   PLT 232.0 03/25/2023 1629   PLT 244 10/10/2022 1548   MCV 90.7 03/25/2023 1629   MCV 89 10/10/2022 1548   MCH 29.6 10/10/2022 1548   MCH 28.8 01/02/2021 1715   MCHC 32.3 03/25/2023 1629   RDW 13.8 03/25/2023 1629   RDW 12.8 10/10/2022 1548   LYMPHSABS 2.6 03/25/2023 1629   MONOABS 1.0 03/25/2023 1629   EOSABS 0.3 03/25/2023 1629   BASOSABS 0.1 03/25/2023 1629    BMET    Component Value Date/Time   NA 141 03/25/2023 1629   NA 143 10/24/2022 1223   K 4.5 03/25/2023 1629   CL 106 03/25/2023 1629   CO2 29 03/25/2023 1629   GLUCOSE 86 03/25/2023 1629   BUN 11 03/25/2023 1629   BUN 10 10/24/2022 1223   CREATININE 0.86 03/25/2023 1629   CREATININE 0.69 (L) 12/22/2019 1644   CALCIUM 9.6 03/25/2023 1629   GFRNONAA >60 01/02/2021 1715   GFRNONAA 108 12/22/2019 1644   GFRAA 115 09/04/2020 0000   GFRAA 125 12/22/2019 1644    BNP    Component Value Date/Time   BNP 15.2 03/08/2018 2033     Imaging:  No results found.  Administration History     None          Latest Ref Rng & Units 01/20/2017    4:45 PM  PFT Results  FVC-Pre L 4.14   FVC-Predicted Pre % 110   FVC-Post L 4.10   FVC-Predicted Post % 109   Pre FEV1/FVC % % 90   Post FEV1/FCV % % 91   FEV1-Pre L 3.74   FEV1-Predicted Pre % 125   FEV1-Post L 3.72   DLCO uncorrected ml/min/mmHg 26.27   DLCO UNC% % 92   DLCO corrected ml/min/mmHg 24.74   DLCO COR %Predicted % 87   DLVA Predicted % 103   TLC L 5.37   TLC % Predicted % 84   RV % Predicted % 67     No results found for: "  NITRICOXIDE"      Assessment & Plan:   No problem-specific Assessment & Plan notes found for this  encounter.    I spent 32 minutes of dedicated to the care of this patient on the date of this encounter to include pre-visit review of records, face-to-face time with the patient discussing conditions above, post visit ordering of testing, clinical documentation with the electronic health record, making appropriate referrals as documented, and communicating necessary findings to members of the patients care team.  Noemi Chapel, NP 07/20/2023  Pt aware and understands NP's role.

## 2023-07-21 ENCOUNTER — Encounter: Payer: Self-pay | Admitting: Nurse Practitioner

## 2023-07-21 DIAGNOSIS — J309 Allergic rhinitis, unspecified: Secondary | ICD-10-CM | POA: Insufficient documentation

## 2023-07-21 DIAGNOSIS — J329 Chronic sinusitis, unspecified: Secondary | ICD-10-CM | POA: Insufficient documentation

## 2023-07-21 LAB — POCT EXHALED NITRIC OXIDE: FeNO level (ppb): 5

## 2023-07-21 NOTE — Assessment & Plan Note (Signed)
Moderate OSA on CPAP. Good compliance and control. Receives benefit from use.  Still has some persistent daytime fatigue symptoms.  Part of this appears to be related to poor asthma control and frequent night awakenings due to this.  Will reassess at follow-up.  Encouraged to continue using nightly.  Aware of risks of untreated sleep apnea.  Understands proper care/use.  Safe driving practices reviewed.  Healthy weight loss encouraged.

## 2023-07-21 NOTE — Assessment & Plan Note (Signed)
Likely contributing to cough. See above. Add on intranasal steroid. Consider daily antihistamine.

## 2023-07-21 NOTE — Assessment & Plan Note (Signed)
Worsening asthma control. Does not appear to be in acute exacerbation. Exhaled nitric oxide testing is normal.  Will hold off on any oral corticosteroids.  Lung exam clear.  He does receive benefit from ICS/SABA use. Will start him on controller therapy with Symbicort 2 puffs Twice daily and reassess response.  Reviewed role of maintenance inhaler versus rescue.  Teach back performed.  Side effect profile reviewed.  Avoid triggers.  Obtain allergy panel to assess for allergic component.  Prior CBC with eos 300.  If no improvement, recommend imaging and further testing. Action plan in place.   Patient Instructions  Continue to use CPAP every night, minimum of 4-6 hours a night.  Change equipment as directed. Wash your tubing with warm soap and water daily, hang to dry. Wash humidifier portion weekly. Use bottled, distilled water and change daily Be aware of reduced alertness and do not drive or operate heavy machinery if experiencing this or drowsiness.  Exercise encouraged, as tolerated. Healthy weight management discussed.  Avoid or decrease alcohol consumption and medications that make you more sleepy, if possible. Notify if persistent daytime sleepiness occurs even with consistent use of PAP therapy.   Continue aisupra 2 puffs every 6 hours as needed for shortness of breath or wheezing. Notify if symptoms persist despite rescue inhaler/neb use.  Start Symbicort 2 puffs Twice daily. Brush tongue and rinse mouth afterwards. This will be your new daily inhaler that you will use regardless of symptoms  Flonase nasal spray 2 sprays each nostril daily  Allergen panel today    Follow up in 6 weeks with Dr. Vassie Loll or Philis Nettle. If symptoms do not improve or worsen, please contact office for sooner follow up

## 2023-07-23 NOTE — Telephone Encounter (Signed)
 Katie, please see below message and advise. Thanks

## 2023-07-23 NOTE — Telephone Encounter (Signed)
I'm off week first week in April until 4/9. I can do a virtual visit at 4 pm on Friday PM clinic before May if he's okay with this?

## 2023-07-24 LAB — ALLERGEN PANEL (27) + IGE
Alternaria Alternata IgE: <0.1 kU/L
Aspergillus Fumigatus IgE: <0.1 kU/L
Bahia Grass IgE: <0.1 kU/L
Bermuda Grass IgE: <0.1 kU/L
Cat Dander IgE: <0.1 kU/L
Cedar, Mountain IgE: <0.1 kU/L
Cladosporium Herbarum IgE: <0.1 kU/L
Cocklebur IgE: <0.1 kU/L
Cockroach, American IgE: <0.1 kU/L
Common Silver Birch IgE: <0.1 kU/L
D Farinae IgE: <0.1 kU/L
D Pteronyssinus IgE: <0.1 kU/L
Dog Dander IgE: <0.1 kU/L
Elm, American IgE: <0.1 kU/L
Hickory, White IgE: <0.1 kU/L
IgE (Immunoglobulin E), Serum: 154 [IU]/mL (ref 6–495)
Johnson Grass IgE: <0.1 kU/L
Kentucky Bluegrass IgE: <0.1 kU/L
Maple/Box Elder IgE: <0.1 kU/L
Mucor Racemosus IgE: <0.1 kU/L
Oak, White IgE: <0.1 kU/L
Penicillium Chrysogen IgE: <0.1 kU/L
Pigweed, Rough IgE: <0.1 kU/L
Plantain, English IgE: <0.1 kU/L
Ragweed, Short IgE: <0.1 kU/L
Setomelanomma Rostrat: 0.16 kU/L — AB
Timothy Grass IgE: <0.1 kU/L
White Mulberry IgE: <0.1 kU/L

## 2023-07-30 ENCOUNTER — Encounter: Payer: Self-pay | Admitting: Internal Medicine

## 2023-07-30 ENCOUNTER — Ambulatory Visit: Payer: No Typology Code available for payment source | Admitting: Internal Medicine

## 2023-07-30 VITALS — BP 140/86 | HR 93 | Temp 98.3°F | Ht 67.0 in | Wt 265.8 lb

## 2023-07-30 DIAGNOSIS — K429 Umbilical hernia without obstruction or gangrene: Secondary | ICD-10-CM | POA: Diagnosis not present

## 2023-07-30 DIAGNOSIS — K2100 Gastro-esophageal reflux disease with esophagitis, without bleeding: Secondary | ICD-10-CM | POA: Diagnosis not present

## 2023-07-30 DIAGNOSIS — I10 Essential (primary) hypertension: Secondary | ICD-10-CM

## 2023-07-30 DIAGNOSIS — Z0001 Encounter for general adult medical examination with abnormal findings: Secondary | ICD-10-CM | POA: Diagnosis not present

## 2023-07-30 DIAGNOSIS — E785 Hyperlipidemia, unspecified: Secondary | ICD-10-CM

## 2023-07-30 NOTE — Patient Instructions (Signed)
 Health Maintenance, Male  Adopting a healthy lifestyle and getting preventive care are important in promoting health and wellness. Ask your health care provider about:  The right schedule for you to have regular tests and exams.  Things you can do on your own to prevent diseases and keep yourself healthy.  What should I know about diet, weight, and exercise?  Eat a healthy diet    Eat a diet that includes plenty of vegetables, fruits, low-fat dairy products, and lean protein.  Do not eat a lot of foods that are high in solid fats, added sugars, or sodium.  Maintain a healthy weight  Body mass index (BMI) is a measurement that can be used to identify possible weight problems. It estimates body fat based on height and weight. Your health care provider can help determine your BMI and help you achieve or maintain a healthy weight.  Get regular exercise  Get regular exercise. This is one of the most important things you can do for your health. Most adults should:  Exercise for at least 150 minutes each week. The exercise should increase your heart rate and make you sweat (moderate-intensity exercise).  Do strengthening exercises at least twice a week. This is in addition to the moderate-intensity exercise.  Spend less time sitting. Even light physical activity can be beneficial.  Watch cholesterol and blood lipids  Have your blood tested for lipids and cholesterol at 58 years of age, then have this test every 5 years.  You may need to have your cholesterol levels checked more often if:  Your lipid or cholesterol levels are high.  You are older than 58 years of age.  You are at high risk for heart disease.  What should I know about cancer screening?  Many types of cancers can be detected early and may often be prevented. Depending on your health history and family history, you may need to have cancer screening at various ages. This may include screening for:  Colorectal cancer.  Prostate cancer.  Skin cancer.  Lung  cancer.  What should I know about heart disease, diabetes, and high blood pressure?  Blood pressure and heart disease  High blood pressure causes heart disease and increases the risk of stroke. This is more likely to develop in people who have high blood pressure readings or are overweight.  Talk with your health care provider about your target blood pressure readings.  Have your blood pressure checked:  Every 3-5 years if you are 9-95 years of age.  Every year if you are 85 years old or older.  If you are between the ages of 29 and 29 and are a current or former smoker, ask your health care provider if you should have a one-time screening for abdominal aortic aneurysm (AAA).  Diabetes  Have regular diabetes screenings. This checks your fasting blood sugar level. Have the screening done:  Once every three years after age 23 if you are at a normal weight and have a low risk for diabetes.  More often and at a younger age if you are overweight or have a high risk for diabetes.  What should I know about preventing infection?  Hepatitis B  If you have a higher risk for hepatitis B, you should be screened for this virus. Talk with your health care provider to find out if you are at risk for hepatitis B infection.  Hepatitis C  Blood testing is recommended for:  Everyone born from [birth year] through 1965.  Anyone  with known risk factors for hepatitis C.  Sexually transmitted infections (STIs)  You should be screened each year for STIs, including gonorrhea and chlamydia, if:  You are sexually active and are younger than 58 years of age.  You are older than 58 years of age and your health care provider tells you that you are at risk for this type of infection.  Your sexual activity has changed since you were last screened, and you are at increased risk for chlamydia or gonorrhea. Ask your health care provider if you are at risk.  Ask your health care provider about whether you are at high risk for HIV. Your health care provider  may recommend a prescription medicine to help prevent HIV infection. If you choose to take medicine to prevent HIV, you should first get tested for HIV. You should then be tested every 3 months for as long as you are taking the medicine.  Follow these instructions at home:  Alcohol use  Do not drink alcohol if your health care provider tells you not to drink.  If you drink alcohol:  Limit how much you have to 0-2 drinks a day.  Know how much alcohol is in your drink. In the [place] one drink equals one 12 oz bottle of beer (355 mL), one 5 oz glass of wine (148 mL), or one 1 oz glass of hard liquor (44 mL).  Lifestyle  Do not use any products that contain nicotine or tobacco. These products include cigarettes, chewing tobacco, and vaping devices, such as e-cigarettes. If you need help quitting, ask your health care provider.  Do not use street drugs.  Do not share needles.  Ask your health care provider for help if you need support or information about quitting drugs.  General instructions  Schedule regular health, dental, and eye exams.  Stay current with your vaccines.  Tell your health care provider if:  You often feel depressed.  You have ever been abused or do not feel safe at home.  Summary  Adopting a healthy lifestyle and getting preventive care are important in promoting health and wellness.  Follow your health care provider's instructions about healthy diet, exercising, and getting tested or screened for diseases.  Follow your health care provider's instructions on monitoring your cholesterol and blood pressure.  This information is not intended to replace advice given to you by your health care provider. Make sure you discuss any questions you have with your health care provider.  Document Revised: 10/08/2020 Document Reviewed: 10/08/2020  Elsevier Patient Education  2024 [employer].

## 2023-07-30 NOTE — Progress Notes (Unsigned)
 Subjective:  Patient ID: [name], male    DOB: 1965/06/17  Age: 58 y.o. MRN: [id number]  CC: Annual Exam   HPI [name] presents for a CPX and f/up ----  Discussed the use of AI scribe software for clinical note transcription with the patient, who gave verbal consent to proceed.  History of Present Illness   [name] is a 58 year old male who presents with worsening umbilical hernia pain. He is accompanied by his wife.  He has been experiencing worsening pain associated with an umbilical hernia located at the navel. The pain has been increasing over time and is exacerbated by activities such as sitting, bending, or lifting objects. Occasionally, the hernia protrudes and then retracts. There is no impact on bowel movements, and he has not experienced vomiting.  He is currently managing the pain with tramadol and Advil. He has not previously consulted a surgeon regarding the hernia. No chest pain, shortness of breath, dizziness, lightheadedness, changes in weight or appetite, and vomiting. He confirms continued use of Xanax and inhalers.  He received a flu shot in October of the previous year, as verified by his wife.       Outpatient Medications Prior to Visit  Medication Sig Dispense Refill   Albuterol-Budesonide (AIRSUPRA) 90-80 MCG/ACT AERO Inhale 2 puffs into the lungs 4 (four) times daily as needed. 32.1 g 1   ALPRAZolam (XANAX) 0.5 MG tablet Take 1 tablet (0.5 mg total) by mouth 2 (two) times daily as needed for anxiety. 180 tablet 0   budesonide-formoterol (SYMBICORT) 160-4.5 MCG/ACT inhaler Inhale 2 puffs into the lungs in the morning and at bedtime. 1 each 6   fluticasone (FLONASE) 50 MCG/ACT nasal spray Place 2 sprays into both nostrils daily. 18.2 mL 2   furosemide (LASIX) 20 MG tablet Take 1 tablet (20 mg total) by mouth 2 (two) times daily. Take 1 Tablet Daily. ( Can Take Additional Tablet As Needed). 180 tablet 0   ibuprofen (ADVIL) 800 MG  tablet Take 1 tablet (800 mg total) by mouth 2 (two) times daily as needed. 60 tablet 3   Multiple Vitamin (MULTIVITAMIN WITH MINERALS) TABS tablet Take 1 tablet by mouth daily.     nitroGLYCERIN (NITROSTAT) 0.3 MG SL tablet Place 1 tablet (0.3 mg total) under the tongue 2 (two) times daily as needed. 90 tablet 0   rosuvastatin (CRESTOR) 40 MG tablet Take 1 tablet (40 mg total) by mouth daily. 90 tablet 3   tadalafil (CIALIS) 5 MG tablet Take 1 tablet (5 mg total) by mouth daily. 90 tablet 1   traMADol (ULTRAM) 50 MG tablet Take 1 tablet (50 mg total) by mouth every 12 (twelve) hours as needed. 30 tablet 2   No facility-administered medications prior to visit.    ROS Review of Systems  Constitutional: Negative.  Negative for appetite change, chills, diaphoresis and fatigue.  HENT: Negative.    Eyes: Negative.   Respiratory: Negative.  Negative for cough, chest tightness, shortness of breath and wheezing.   Cardiovascular:  Negative for chest pain, palpitations and leg swelling.  Gastrointestinal: Negative.  Negative for abdominal pain, constipation, diarrhea, nausea and vomiting.  Endocrine: Negative.   Genitourinary: Negative.  Negative for difficulty urinating.  Musculoskeletal:  Positive for arthralgias and back pain. Negative for myalgias.  Skin: Negative.   Neurological:  Negative for dizziness, weakness and light-headedness.  Hematological:  Negative for adenopathy. Does not bruise/bleed easily.  Psychiatric/Behavioral: Negative.  Objective:  BP (!) 140/86 (BP Location: Left Arm, Patient Position: Sitting)   Pulse 93   Temp 98.3 F (36.8 C) (Oral)   Ht 5\' 7"  (1.702 m)   Wt 265 lb 12.8 oz (120.6 kg)   SpO2 93%   BMI 41.63 kg/m   BP Readings from Last 3 Encounters:  07/30/23 (!) 140/86  07/20/23 136/84  06/10/23 138/80    Wt Readings from Last 3 Encounters:  07/30/23 265 lb 12.8 oz (120.6 kg)  07/20/23 265 lb (120.2 kg)  04/16/23 269 lb 12.8 oz (122.4 kg)     Physical Exam Vitals reviewed.  Constitutional:      Appearance: Normal appearance.  HENT:     Nose: Nose normal.     Mouth/Throat:     Mouth: Mucous membranes are moist.  Eyes:     General: No scleral icterus.    Conjunctiva/sclera: Conjunctivae normal.  Cardiovascular:     Rate and Rhythm: Normal rate and regular rhythm.     Heart sounds: No murmur heard.    No friction rub. No gallop.  Pulmonary:     Effort: Pulmonary effort is normal.     Breath sounds: No stridor. No wheezing, rhonchi or rales.  Abdominal:     General: Abdomen is protuberant. Bowel sounds are normal. There is no distension.     Palpations: There is no hepatomegaly, splenomegaly or mass.     Tenderness: There is no abdominal tenderness.     Hernia: A hernia is present. Hernia is present in the umbilical area.  Genitourinary:    Comments: GU/DRE deferred at his request Musculoskeletal:        General: Normal range of motion.     Cervical back: Neck supple.     Right lower leg: No edema.     Left lower leg: No edema.  Lymphadenopathy:     Cervical: No cervical adenopathy.  Skin:    General: Skin is warm and dry.  Neurological:     General: No focal deficit present.     Mental Status: He is alert. Mental status is at baseline.  Psychiatric:        Mood and Affect: Mood normal.        Behavior: Behavior normal.     Lab Results  Component Value Date   WBC 10.5 07/30/2023   HGB 13.4 07/30/2023   HCT 40.9 07/30/2023   PLT 237.0 07/30/2023   GLUCOSE 97 07/30/2023   CHOL 120 03/25/2023   TRIG 58.0 03/25/2023   HDL 46.10 03/25/2023   LDLCALC 63 03/25/2023   ALT 19 07/30/2023   AST 16 07/30/2023   NA 144 07/30/2023   K 4.2 07/30/2023   CL 107 07/30/2023   CREATININE 1.03 07/30/2023   BUN 11 07/30/2023   CO2 27 07/30/2023   TSH 1.39 07/30/2023   PSA 0.39 07/30/2023   INR CANCELED 08/12/2013   HGBA1C 5.7 (H) 12/22/2019    MR Lumbar Spine Wo Contrast Result Date: 02/05/2023 CLINICAL  DATA:  Back pain with bilateral radiculopathy EXAM: MRI LUMBAR SPINE WITHOUT CONTRAST TECHNIQUE: Multiplanar, multisequence MR imaging of the lumbar spine was performed. No intravenous contrast was administered. COMPARISON:  None Available. FINDINGS: Segmentation:  Standard. Alignment:  Physiologic. Vertebrae:  No fracture, evidence of discitis, or bone lesion. Conus medullaris and cauda equina: Conus extends to the L2 level. Conus and cauda equina appear normal. Paraspinal and other soft tissues: Negative. Disc levels: T12-L1: Only imaged in the sagittal plane. No evidence of  high-grade spinal canal or neural foraminal stenosis. L1-L2: Unremarkable L2-L3: Mild bilateral facet degenerative change. No spinal canal or neural foraminal narrowing. L3-L4: Moderate bilateral facet degenerative change with fluid in the facet joints. Minimal disc bulge. Short pedicles. Mild spinal canal narrowing. Mild bilateral neural foraminal narrowing. L4-L5: Moderate bilateral facet degenerative change with fluid in the facet joint on the right. Minimal disc bulge. No spinal canal narrowing. Mild bilateral neural foraminal narrowing. L5-S1: Moderate bilateral facet degenerative change. No significant disc bulge. No spinal canal narrowing. No neural foraminal narrowing. IMPRESSION: 1. Mild lower lumbar spine predominant degenerative change without evidence of high-grade spinal canal or neural foraminal stenosis. 2. Moderate bilateral facet degenerative change at L3-L4 and L4-L5 with fluid in the facet joints at these levels, which can be a source of pain. Electronically Signed   By: Lorenza Cambridge M.D.   On: 02/05/2023 15:36    Assessment & Plan:   Essential hypertension- His BP is well controlled. -     Basic metabolic panel; Future -     TSH; Future  Dyslipidemia, goal LDL below 100 - LDL goal achieved. Doing well on the statin  -     Hepatic function panel; Future -     TSH; Future  Encounter for general adult medical  examination with abnormal findings- Exam completed, labs reviewed, vaccines reviewed, cancer screenings addressed, pt ed material was given.  -     PSA; Future  Gastroesophageal reflux disease with esophagitis without hemorrhage -     CBC with Differential/Platelet; Future  Umbilical hernia without obstruction and without gangrene -     Ambulatory referral to General Surgery     Follow-up: Return in about 6 months (around 01/27/2024).  Sanda Linger, MD

## 2023-07-31 LAB — CBC WITH DIFFERENTIAL/PLATELET
Basophils Absolute: 0.1 10*3/uL (ref 0.0–0.1)
Basophils Relative: 0.8 % (ref 0.0–3.0)
Eosinophils Absolute: 0.4 10*3/uL (ref 0.0–0.7)
Eosinophils Relative: 3.6 % (ref 0.0–5.0)
HCT: 40.9 % (ref 39.0–52.0)
Hemoglobin: 13.4 g/dL (ref 13.0–17.0)
Lymphocytes Relative: 25.7 % (ref 12.0–46.0)
Lymphs Abs: 2.7 10*3/uL (ref 0.7–4.0)
MCHC: 32.8 g/dL (ref 30.0–36.0)
MCV: 89.9 fL (ref 78.0–100.0)
Monocytes Absolute: 0.9 10*3/uL (ref 0.1–1.0)
Monocytes Relative: 8.3 % (ref 3.0–12.0)
Neutro Abs: 6.5 10*3/uL (ref 1.4–7.7)
Neutrophils Relative %: 61.6 % (ref 43.0–77.0)
Platelets: 237 10*3/uL (ref 150.0–400.0)
RBC: 4.55 Mil/uL (ref 4.22–5.81)
RDW: 14.5 % (ref 11.5–15.5)
WBC: 10.5 10*3/uL (ref 4.0–10.5)

## 2023-07-31 LAB — BASIC METABOLIC PANEL
BUN: 11 mg/dL (ref 6–23)
CO2: 27 meq/L (ref 19–32)
Calcium: 9.5 mg/dL (ref 8.4–10.5)
Chloride: 107 meq/L (ref 96–112)
Creatinine, Ser: 1.03 mg/dL (ref 0.40–1.50)
GFR: 80.46 mL/min (ref >60.00)
Glucose, Bld: 97 mg/dL (ref 70–99)
Potassium: 4.2 meq/L (ref 3.5–5.1)
Sodium: 144 meq/L (ref 135–145)

## 2023-07-31 LAB — HEPATIC FUNCTION PANEL
ALT: 19 U/L (ref 0–53)
AST: 16 U/L (ref 0–37)
Albumin: 4.2 g/dL (ref 3.5–5.2)
Alkaline Phosphatase: 46 U/L (ref 39–117)
Bilirubin, Direct: 0.1 mg/dL (ref 0.0–0.3)
Total Bilirubin: 0.4 mg/dL (ref 0.2–1.2)
Total Protein: 7.1 g/dL (ref 6.0–8.3)

## 2023-07-31 LAB — PSA: PSA: 0.39 ng/mL (ref 0.10–4.00)

## 2023-07-31 LAB — TSH: TSH: 1.39 u[IU]/mL (ref 0.35–5.50)

## 2023-08-10 ENCOUNTER — Telehealth: Payer: Self-pay | Admitting: Internal Medicine

## 2023-08-10 NOTE — Telephone Encounter (Signed)
 Copied from CRM [id number]. Topic: General - Other >> Aug 10, 2023  8:59 AM Elizebeth Brooking wrote: Reason for CRM: Olegario Messier from [facility] called in to informed Dr Yetta Barre that patient is scheduled to be seen at the office   03/17 at 9:45 at [facility] Office Consult

## 2023-08-11 ENCOUNTER — Other Ambulatory Visit: Payer: Self-pay | Admitting: Cardiology

## 2023-08-11 DIAGNOSIS — I2089 Other forms of angina pectoris: Secondary | ICD-10-CM

## 2023-08-13 ENCOUNTER — Encounter: Payer: Self-pay | Admitting: Nurse Practitioner

## 2023-08-13 ENCOUNTER — Telehealth: Payer: Self-pay | Admitting: Student

## 2023-08-13 NOTE — Telephone Encounter (Signed)
 Sent Pharmacist, hospital.

## 2023-08-14 ENCOUNTER — Telehealth: Payer: No Typology Code available for payment source | Admitting: Nurse Practitioner

## 2023-08-14 ENCOUNTER — Encounter: Payer: Self-pay | Admitting: Nurse Practitioner

## 2023-08-14 DIAGNOSIS — J329 Chronic sinusitis, unspecified: Secondary | ICD-10-CM

## 2023-08-14 DIAGNOSIS — J4530 Mild persistent asthma, uncomplicated: Secondary | ICD-10-CM

## 2023-08-14 DIAGNOSIS — G4710 Hypersomnia, unspecified: Secondary | ICD-10-CM | POA: Diagnosis not present

## 2023-08-14 DIAGNOSIS — K219 Gastro-esophageal reflux disease without esophagitis: Secondary | ICD-10-CM

## 2023-08-14 DIAGNOSIS — G4730 Sleep apnea, unspecified: Secondary | ICD-10-CM

## 2023-08-14 DIAGNOSIS — R053 Chronic cough: Secondary | ICD-10-CM

## 2023-08-14 DIAGNOSIS — R0609 Other forms of dyspnea: Secondary | ICD-10-CM

## 2023-08-14 MED ORDER — MODAFINIL 100 MG PO TABS: 200.0000 mg | ORAL_TABLET | Freq: Every day | ORAL | 1 refills | Status: DC

## 2023-08-14 MED ORDER — OMEPRAZOLE 40 MG PO CPDR: 40.0000 mg | DELAYED_RELEASE_CAPSULE | Freq: Every day | ORAL | 1 refills | Status: DC

## 2023-08-14 NOTE — Progress Notes (Signed)
 Patient ID: [name], male     DOB: 05/03/1966, 58 y.o.      MRN: [id number]  No chief complaint on file.   Virtual Visit via Video Note  I connected with Kennieth Rad on 08/14/23 at  4:00 PM EDT by a video enabled telemedicine application and verified that I am speaking with the correct person using two identifiers.  Location: Patient: Home Provider: Office   I discussed the limitations of evaluation and management by telemedicine and the availability of in person appointments. The patient expressed understanding and agreed to proceed.  History of Present Illness: 58 year old male, former smoker followed for OSA on CPAP and mild asthma. He is a patient of Dr. Reginia Naas and last seen in office 07/20/2023 by [facility] NP. Past medical history significant for HTN, RBBB, ED, angina, GERD, PUD, obesity, CHF, hx of saddle PE.    TEST/EVENTS:  09/2013 HST: AHI 24/h 01/2017 PFTs normal 07/2023 eos 400; RAST positive to setomelanomma rostrat; IgE 154 07/20/2023 FeNO nl   12/31/2021: OV with Dr. Vassie Loll. Moderate OSA on CPAP. Does not feel fully rested and doesn't have as much energy. Wonders if CPAP needs to be adjusted. Doesn't always feel like he's getting enough air. Wife has not witnessed any apneas or snoring. Excellent compliance and good control on set pressure 12 cmH2O; will adjust him to auto 12-15 cmH2O and trial AirFit F30 fullface mask. His PCP had given him Trelegy to try but didn't feel like this helped. Felt as thought he didn't need this. Had him resume PRN SABA. Advised him to start asteline for allergic rhinitis and OTC nasal decongestant as needed.    01/01/2023: OV with Adalene Gulotta NP for follow up. He has been doing okay since he was here last. He was having difficulties with fatigue, SOB, diaphoresis, and chest discomfort that was evaluated by cardiology. He had LHC with mild CAD, without need for intervention. He was found to have LVEDP of 26-28 and therefore started on diuretics. He  takes 20 mg 1-2 times daily depending on symptoms. He feels like this has helped with his symptoms. Breathing feels better. Only has to use albuterol during allergy season. No cough, chest congestion, wheezing.  Regarding his CPAP, he thinks he is due for a new machine. He's been having to tape his water chamber in so he would like to get a new one if able. He does feel like he's better rested, most nights, with the change to full face mask and auto setting. He still has some days that he wakes up and feels more tired than others. He was also having some issues with mask leaking but changed his mask last night and slept better so he thinks this fixed the problem. No drowsy driving or morning headaches.    12/02/2022-12/31/2022: CPAP 12-15 cmH2O 28/30 days; 93% >4 hr; av use 7 hr 40 min Pressure 95th 13 Leaks 95th 17 AHI 1.6   04/16/2023: Ov with Greysen Swanton NP for follow up. Doing well on new CPAP for the most part. He is having some leaks and trouble with his mask. Feels better rested. Does have some fatigue after work. Sleeps well at night aside from the leaks. Denies any drowsy driving or sleep parasomnias/paralysis.  Download reviewed on phone 93% >4 hr; average use 5 hr 46 min Leaks 95th 31 AHI 1.43   07/20/2023: OV with Taunja Brickner NP for follow up. We sent orders to adjust his CPAP settings at his last visit  but the DME company did not update this. He feels like he's doing okay for right now. Not noticing as many leaks. Sleeping with it every night. Does still have some daytime fatigue but he is only averaging 6 hours of sleep a night, sometimes less. Denies any drowsy driving or sleep parasomnias/paralysis. No issues with sleep onset. Does have some difficulties with maintenance due to his breathing.  Feels like his asthma has been difficult to manage recently. Some cough at night; has more trouble with his breathing at night but does get some chest tightness and wheezing during the day. Phlegm can be green  sometimes. This has been ongoing for a few months. Denies any fevers, chills, hemoptysis, night sweats. Does have some sinus congestion and drainage. He does have seasonal allergies. He's not using any nasal sprays. He is using his Paulene Floor twice a day, which does help.  06/20/2023-07/19/2023: CPAP 12-15 cmH2O 28/30 days; 90% >4 hr; average use 5 hr 57 min Pressure 95th 14 Leaks 95th 18.9 AHI 1.5   ACT 13 FeNO 5 ppb  08/14/2023: Today - follow up Discussed the use of AI scribe software for clinical note transcription with the patient, who gave verbal consent to proceed.  History of Present Illness   [name] is a 58 year old male with asthma and OSA who presents for follow up.  He experiences persistent respiratory symptoms, including difficulty with shortness of breath, particularly at night, and frequent nasal congestion. He uses Flonase which helps but finds he's still congested at night and has trouble breathing through his nose. He uses Vicks at night to aid breathing, though he still struggles to breathe through one nostril with this. He does have a cough, which fluctuates in color when it is productive.    We started Symbicort at his last visit. Does not feel it has significantly improved his symptoms although, he's not having to use his rescue anymore. He experiences chest tightness and wheezing during the day sometimes. No fevers, chills, hemoptysis. He uses astelin, flonase and an allergy pill for his sinus symptoms.   He uses a full face mask with his CPAP, averaging six hours of use per night, and reports sleeping relatively well with it. However, he experiences daytime fatigue, causing him to want to nap during the day despite consistent CPAP use.    Previous allergen testing showed a mild response to environmental molds. He has not seen an ear, nose, and throat specialist before and has not had recent lung function testing.  No significant reflux symptoms or heartburn.  No significant daytime sleepiness while driving, morning headaches, sleep parasomnias. Mood is stable without significant anxiety or depression.       07/14/2023-08/12/2023: CPAP 12-15 cmH2O 29/30 days; 93% >4 hr; average use 5 hr 58 min Pressure 95th 14 Leaks 95th 21.6 AHI 1.9  No Known Allergies Immunization History  Administered Date(s) Administered   Hepatitis A, Adult 08/28/2017, 01/20/2018   Influenza Whole 03/02/2012   Influenza, High Dose Seasonal PF 06/02/2017, 03/22/2019   Influenza,inj,Quad PF,6+ Mos 02/23/2019   Influenza-Unspecified 03/10/2013   PFIZER(Purple Top)SARS-COV-2 Vaccination 08/14/2019, 09/03/2019, 04/15/2020   PNEUMOCOCCAL CONJUGATE-20 11/13/2020   Tdap 06/18/2011, 08/14/2021   Zoster Recombinant(Shingrix) 08/14/2021, 12/16/2021   Past Medical History:  Diagnosis Date   Anxiety    Arthritis    knees - no med   Cluster headache    Hx - resolved per patient   Depression    DVT of lower extremity (deep venous thrombosis) (  HCC) 6/26-28/2013   Xarelto - resolved - history   HTN (hypertension)    currently no meds   Irritability and anger    PMH of   Kidney stone    passed stone, no surgery required.   Mild intermittent asthma without complication 11/13/2020   Rotator cuff arthropathy 2007   right   Saddle pulmonary embolus (HCC) 6/26-28/2013   post orthopedic surgery - resolved   Sleep apnea 09/2013   moderate OSA-has cpap but does not use it    Tobacco History: Social History   Tobacco Use  Smoking Status Former   Types: Cigars   Quit date: 10/04/2022   Years since quitting: 0.8   Passive exposure: Never  Smokeless Tobacco Never  Tobacco Comments   every 3-4 months   Counseling given: Not Answered Tobacco comments: every 3-4 months   Outpatient Medications Prior to Visit  Medication Sig Dispense Refill   Albuterol-Budesonide (AIRSUPRA) 90-80 MCG/ACT AERO Inhale 2 puffs into the lungs 4 (four) times daily as needed. 32.1 g 1    ALPRAZolam (XANAX) 0.5 MG tablet Take 1 tablet (0.5 mg total) by mouth 2 (two) times daily as needed for anxiety. 180 tablet 0   budesonide-formoterol (SYMBICORT) 160-4.5 MCG/ACT inhaler Inhale 2 puffs into the lungs in the morning and at bedtime. 1 each 6   fluticasone (FLONASE) 50 MCG/ACT nasal spray Place 2 sprays into both nostrils daily. 18.2 mL 2   furosemide (LASIX) 20 MG tablet Take 1 tablet (20 mg total) by mouth 2 (two) times daily. Take 1 Tablet Daily. ( Can Take Additional Tablet As Needed). 180 tablet 0   ibuprofen (ADVIL) 800 MG tablet Take 1 tablet (800 mg total) by mouth 2 (two) times daily as needed. 60 tablet 3   Multiple Vitamin (MULTIVITAMIN WITH MINERALS) TABS tablet Take 1 tablet by mouth daily.     nitroGLYCERIN (NITROSTAT) 0.3 MG SL tablet PLACE 1 TABLET (0.3 MG TOTAL) UNDER THE TONGUE 2 (TWO) TIMES DAILY AS NEEDED. 25 tablet 2   rosuvastatin (CRESTOR) 40 MG tablet Take 1 tablet (40 mg total) by mouth daily. 90 tablet 3   tadalafil (CIALIS) 5 MG tablet Take 1 tablet (5 mg total) by mouth daily. 90 tablet 1   traMADol (ULTRAM) 50 MG tablet Take 1 tablet (50 mg total) by mouth every 12 (twelve) hours as needed. 30 tablet 2   No facility-administered medications prior to visit.     Review of Systems:   Constitutional: No weight loss or gain, night sweats, fevers, chills, or lassitude. +fatigue  HEENT: No headaches, difficulty swallowing, tooth/dental problems, or sore throat. No sneezing, itching, ear ache +nasal congestion, post nasal drip CV:  +PND. No chest pain, orthopnea, swelling in lower extremities, anasarca, dizziness, palpitations, syncope Resp: +shortness of breath with exertion; nocturnal and AM productive cough; wheeze. No hemoptysis. No chest wall deformity GI:  No heartburn, indigestion Skin: No rash, lesions, ulcerations MSK:  No joint pain or swelling.   Neuro: No dizziness or lightheadedness.  Psych: No depression or anxiety. Mood  stable.  Observations/Objective: Patient is well-developed, well-nourished in no acute distress.  Resting comfortably at home.  No labored breathing.  Speech is clear and coherent with logical content.  Patient is alert and oriented at baseline.   Assessment and Plan:  Asthma Unclear if truly asthma driven vs chronic sinus disease. Symbicort has reduced rescue use, indicating some benefit. No acute worsening today to indicate exacerbation. Prior FeNO nl. He does have peripheral eosinophilia  and mild elevation in IgE. Positive RAST to mold. Silent reflux possible contributing factor to cough/ongoing respiratory symptoms. Target GERD with PPI therapy and reassess response. Referral to ENT to rule out chronic sinusitis. Can consider further allergy workup or potentially biologic therapy if symptoms remain poorly managed. Action plan in place.  - Continue Symbicort as prescribed - Refer to ENT for evaluation  - Consider allergist referral if ENT findings are unremarkable - PFT and chest x-ray at next visit - Try saline nasal gel at bedtime to help with dryness, possibly contributing to sinus disease/cough - Use wedge pillow to elevate head during sleep - Start omeprazole daily for potential reflux for next 6 weeks - Continue PRN Airsupra - Continue allergy regimen  Nasal Congestion Persistent sinusitis/rhinitis symptoms affecting breathing. Flonase not providing significant relief. Discussed use of Afrin for severe congestion at night - educated to not use longer than 3-5 days consecutively avoid rebound symptoms. ENT referral.  - Use Flonase one spray in the morning and one in the evening - Consider Afrin nasal spray for severe congestion, not to exceed three to five consecutive days - Continue astelin nasal spray - Continue xyzal daily  - Refer to ENT for further evaluation  GERD No overt symptoms. Will trial him on PPI to see if this helps with cough/PND. Side effect profile  reviewed - Start omeprazole 40 mg every morning, 30 min prior to breakfast - GERD precautions  Obstructive Sleep Apnea (OSA) OSA well-controlled with CPAP. Aware of risks of untreated OSA. Understands proper use/care of device. Healthy weight loss encourage. Experiences daytime fatigue despite consistent CPAP use. Discussed trial of modafinil for persistent daytime hypersomia. Informed of potential side effects. Educated on proper use.  - Continue CPAP therapy - Start modafinil 200 mg in the morning for daytime fatigue - Follow up in six weeks to assess modafinil efficacy   Obesity Healthy weight loss encouraged. Deconditioning and body habitus possibly contributing to DOE. - Healthy weight loss encouraged       Follow Up Instructions: 6-8 weeks with Dr. Vassie Loll or Katie Sparkles Mcneely,NP. If symptoms do not improve or worsen, please contact office for sooner follow up or seek emergency care.     I discussed the assessment and treatment plan with the patient. The patient was provided an opportunity to ask questions and all were answered. The patient agreed with the plan and demonstrated an understanding of the instructions.   The patient was advised to call back or seek an in-person evaluation if the symptoms worsen or if the condition fails to improve as anticipated.  I provided 35 minutes of non-face-to-face time during this encounter.   Noemi Chapel, NP

## 2023-08-14 NOTE — Patient Instructions (Addendum)
 Continue to use CPAP every night, minimum of 4-6 hours a night.  Change equipment as directed. Wash your tubing with warm soap and water daily, hang to dry. Wash humidifier portion weekly. Use bottled, distilled water and change daily Be aware of reduced alertness and do not drive or operate heavy machinery if experiencing this or drowsiness.  Exercise encouraged, as tolerated. Healthy weight management discussed.  Avoid or decrease alcohol consumption and medications that make you more sleepy, if possible. Notify if persistent daytime sleepiness occurs even with consistent use of PAP therapy.  Start modafinil 200 mg every morning as needed for fatigue. Monitor BP/HR. Monitor and notify of any mood changes or disruptions in sleep.    Continue aisupra 2 puffs every 6 hours as needed for shortness of breath or wheezing. Notify if symptoms persist despite rescue inhaler/neb use.  Continue Symbicort 2 puffs Twice daily. Brush tongue and rinse mouth afterwards.  Continue Flonase nasal spray 2 sprays each nostril daily Afrin over the counter as needed for significant nasal congestion. Do not use longer than 3-5 days in a row Daily allergy pill such as claritin, zyrtec, xyzal    PFT and chest x ray at follow up  Start omeprazole 40 mg daily in morning, 30 minutes before breakfast for reflux for the next 6 weeks  Referral to Ear Nose and Throat   Follow up in 6=8 weeks with Dr. Vassie Loll or Philis Nettle. If symptoms do not improve or worsen, please contact office for sooner follow up

## 2023-08-17 ENCOUNTER — Ambulatory Visit: Payer: Self-pay | Admitting: Surgery

## 2023-08-25 ENCOUNTER — Other Ambulatory Visit: Payer: Self-pay | Admitting: Nurse Practitioner

## 2023-08-25 DIAGNOSIS — J309 Allergic rhinitis, unspecified: Secondary | ICD-10-CM

## 2023-08-26 NOTE — Telephone Encounter (Signed)
 PT has appt. NFN

## 2023-08-26 NOTE — Telephone Encounter (Signed)
 NFN

## 2023-08-28 NOTE — Progress Notes (Signed)
 [name] [address] [place] [address] Phone: [phone] Subjective:   [name], am serving as a scribe for Dr. Antoine Primas.  I'm seeing this patient by the request  of:  [name], MD  CC: Bilateral knee pain  [id number]  06/10/2023 Chronic problem with worsening symptoms. Discussed icing regimen and home exercises, discussed which activities to do and which ones to avoid. Patient is still attempting to lose weight. Needs to get weight under 250 pounds so he would be a candidate for replacement. Has done very well over the course the last several months. Encouraged him to continue to be active. Follow-up again in 6 to 8 weeks otherwise.   Updated 09/04/2023 [name] is a 58 y.o. male coming in with complaint of B knee pain. Patient states that his pain seems to be getting worse and worse as time passes.  Patient needs to get BMI under 40 to have surgery.  Patient is within 5 pounds but unfortunately has to have a hernia repair first in May.       Past Medical History:  Diagnosis Date   Anxiety    Arthritis    knees - no med   Cluster headache    Hx - resolved per patient   Depression    DVT of lower extremity (deep venous thrombosis) (HCC) 6/26-28/2013   Xarelto - resolved - history   HTN (hypertension)    currently no meds   Irritability and anger    PMH of   Kidney stone    passed stone, no surgery required.   Mild intermittent asthma without complication 11/13/2020   Rotator cuff arthropathy 2007   right   Saddle pulmonary embolus (HCC) 6/26-28/2013   post orthopedic surgery - resolved   Sleep apnea 09/2013   moderate OSA-has cpap but does not use it   Past Surgical History:  Procedure Laterality Date   arthroscopic knee surgery  10/09/2011   Dr Magnus Ivan   CARDIAC CATHETERIZATION  06/02/2005   LVdysfunction; normal coronaries   COLONOSCOPY     EXTRACORPOREAL SHOCK WAVE LITHOTRIPSY Left  09/05/2022   Procedure: EXTRACORPOREAL SHOCK WAVE LITHOTRIPSY (ESWL);  Surgeon: Jerilee Field, MD;  Location: [facility];  Service: Urology;  Laterality: Left;   EXTRACORPOREAL SHOCK WAVE LITHOTRIPSY Left 11/21/2022   Procedure: EXTRACORPOREAL SHOCK WAVE LITHOTRIPSY (ESWL);  Surgeon: Jerilee Field, MD;  Location: [facility];  Service: Urology;  Laterality: Left;   KNEE ARTHROSCOPY WITH MEDIAL MENISECTOMY Left 09/20/2014   medial menisectomy chondroplastytella medial plica excision  ;  Surgeon: Jodi Geralds, MD;  Location: [facility];  Service: Orthopedics;  Laterality: Left;   LEFT HEART CATH AND CORONARY ANGIOGRAPHY N/A 10/17/2022   Procedure: LEFT HEART CATH AND CORONARY ANGIOGRAPHY;  Surgeon: [name], MD;  Location: [facility];  Service: Cardiovascular;  Laterality: N/A;   ROTATOR CUFF REPAIR  06/02/2005   right   UPPER GASTROINTESTINAL ENDOSCOPY  06/02/2005   normal   WISDOM TOOTH EXTRACTION     Social History   Socioeconomic History   Marital status: Married    Spouse name: Not on file   Number of children: 6   Years of education: 11   Highest education level: Not on file  Occupational History   Occupation: Location manager    Occupation: Lobbyist  Tobacco Use   Smoking status: Former    Types: Medical illustrator  date: 10/04/2022    Years since quitting: 0.9    Passive exposure: Never   Smokeless tobacco: Never   Tobacco comments:    every 3-4 months  Vaping Use   Vaping status: Never Used  Substance and Sexual Activity   Alcohol use: Yes    Alcohol/week: 0.0 standard drinks of alcohol    Comment: drinks occasionally   Drug use: No   Sexual activity: Yes    Partners: Female    Birth control/protection: Condom  Other Topics Concern   Not on file  Social History Narrative   Fun: Exercise   Social Drivers of Health   Financial Resource Strain: Not on file  Food Insecurity: Not on file   Transportation Needs: Not on file  Physical Activity: Not on file  Stress: Not on file  Social Connections: Unknown (10/11/2021)   Received from [employer], [facility]   Social Network    Social Network: Not on file   No Known Allergies Family History  Problem Relation Age of Onset   Cancer Mother        Mesothelioma    Diabetes Brother    Hypertension Brother    Stroke Brother 50   Heart attack Brother 55   Diabetes Maternal Aunt    Diabetes Maternal Grandmother    Heart disease Maternal Grandmother    Hypertension Maternal Grandmother    Stroke Maternal Grandmother        in 81s   Deep vein thrombosis Maternal Grandmother    Stroke Maternal Grandfather        > 55   Migraines Brother    Heart attack Brother 27   Healthy Father    Colon cancer Neg Hx    Esophageal cancer Neg Hx    Rectal cancer Neg Hx    Stomach cancer Neg Hx    Liver cancer Neg Hx    Pancreatic cancer Neg Hx    Other Neg Hx        low testosterone     Current Outpatient Medications (Cardiovascular):    furosemide (LASIX) 20 MG tablet, Take 1 tablet (20 mg total) by mouth 2 (two) times daily. Take 1 Tablet Daily. ( Can Take Additional Tablet As Needed).   nitroGLYCERIN (NITROSTAT) 0.3 MG SL tablet, PLACE 1 TABLET (0.3 MG TOTAL) UNDER THE TONGUE 2 (TWO) TIMES DAILY AS NEEDED.   rosuvastatin (CRESTOR) 40 MG tablet, Take 1 tablet (40 mg total) by mouth daily.   tadalafil (CIALIS) 5 MG tablet, Take 1 tablet (5 mg total) by mouth daily.  Current Outpatient Medications (Respiratory):    Albuterol-Budesonide (AIRSUPRA) 90-80 MCG/ACT AERO, Inhale 2 puffs into the lungs 4 (four) times daily as needed.   budesonide-formoterol (SYMBICORT) 160-4.5 MCG/ACT inhaler, Inhale 2 puffs into the lungs in the morning and at bedtime.   fluticasone (FLONASE) 50 MCG/ACT nasal spray, SPRAY 2 SPRAYS INTO EACH NOSTRIL EVERY DAY  Current Outpatient Medications (Analgesics):    ibuprofen (ADVIL) 800 MG tablet, Take 1  tablet (800 mg total) by mouth 2 (two) times daily as needed.   traMADol (ULTRAM) 50 MG tablet, Take 1 tablet (50 mg total) by mouth every 12 (twelve) hours as needed.   Current Outpatient Medications (Other):    ALPRAZolam (XANAX) 0.5 MG tablet, Take 1 tablet (0.5 mg total) by mouth 2 (two) times daily as needed for anxiety.   modafinil (PROVIGIL) 100 MG tablet, Take 2 tablets (200 mg total) by mouth daily.   Multiple Vitamin (MULTIVITAMIN WITH  MINERALS) TABS tablet, Take 1 tablet by mouth daily.   omeprazole (PRILOSEC) 40 MG capsule, Take 1 capsule (40 mg total) by mouth daily.   Reviewed prior external information including notes and imaging from  primary care provider As well as notes that were available from care everywhere and other healthcare systems.  Past medical history, social, surgical and family history all reviewed in electronic medical record.  No pertanent information unless stated regarding to the chief complaint.   Review of Systems:  No headache, visual changes, nausea, vomiting, diarrhea, constipation, dizziness, abdominal pain, skin rash, fevers, chills, night sweats, weight loss, swollen lymph nodes, body aches, joint swelling, chest pain, shortness of breath, mood changes. POSITIVE muscle aches  Objective  Blood pressure (!) 142/86, height 5\' 7"  (1.702 m), weight 260 lb (117.9 kg).   General: No apparent distress alert and oriented x3 mood and affect normal, dressed appropriately.  HEENT: Pupils equal, extraocular movements intact  Respiratory: Patient's speak in full sentences and does not appear short of breath  Cardiovascular: No lower extremity edema, non tender, no erythema  Patient's knees bilaterally do have significant crepitus noted.  Significant swelling noted bilaterally.  Instability with valgus and varus force.  After informed written and verbal consent, patient was seated on exam table. Right knee was prepped with alcohol swab and utilizing  anterolateral approach, patient's right knee space was injected with 4:1  marcaine 0.5%: Kenalog 40mg /dL. Patient tolerated the procedure well without immediate complications.  After informed written and verbal consent, patient was seated on exam table. Left knee was prepped with alcohol swab and utilizing anterolateral approach, patient's left knee space was injected with 4:1  marcaine 0.5%: Kenalog 40mg /dL. Patient tolerated the procedure well without immediate complications.    Impression and Recommendations:    The above documentation has been reviewed and is accurate and complete Judi Saa, DO

## 2023-09-04 ENCOUNTER — Ambulatory Visit: Admitting: Family Medicine

## 2023-09-04 ENCOUNTER — Encounter: Payer: Self-pay | Admitting: Family Medicine

## 2023-09-04 VITALS — BP 142/86 | Ht 67.0 in | Wt 260.0 lb

## 2023-09-04 DIAGNOSIS — M170 Bilateral primary osteoarthritis of knee: Secondary | ICD-10-CM

## 2023-09-04 NOTE — Patient Instructions (Addendum)
Injected both knees today See me again in 10 weeks 

## 2023-09-04 NOTE — Assessment & Plan Note (Signed)
 Bilateral injections given today, tolerated the procedure well.  Discussed icing regimen and home exercises, patient's BMI is very close and has 5 more pounds to go until he would be a surgical candidate for the knees.  We discussed we can repeat every 10 weeks if needed but remember that and when we do them it will be at least 3 months until he would have surgery.  Patient understands and will be considering having surgery in the near future and will follow-up with orthopedics when he can.

## 2023-09-09 ENCOUNTER — Other Ambulatory Visit: Payer: Self-pay | Admitting: Nurse Practitioner

## 2023-09-09 DIAGNOSIS — K219 Gastro-esophageal reflux disease without esophagitis: Secondary | ICD-10-CM

## 2023-09-23 ENCOUNTER — Ambulatory Visit: Admitting: Nurse Practitioner

## 2023-09-28 ENCOUNTER — Other Ambulatory Visit: Payer: Self-pay | Admitting: Internal Medicine

## 2023-09-28 DIAGNOSIS — I10 Essential (primary) hypertension: Secondary | ICD-10-CM

## 2023-09-30 ENCOUNTER — Other Ambulatory Visit: Payer: Self-pay

## 2023-09-30 ENCOUNTER — Encounter (HOSPITAL_BASED_OUTPATIENT_CLINIC_OR_DEPARTMENT_OTHER): Payer: Self-pay | Admitting: Surgery

## 2023-09-30 NOTE — Progress Notes (Signed)
 During pre-op phone call, patient states he took his nitroglycerin  tablet this morning due to chest pain "while lifting something heavy". Chart reviewed with Dr. Annabell Key and he states patient would be a better candidate for surgery at [facility] OR. Jenette Mitchell at Dr. Jodie Munson office made aware.

## 2023-10-02 ENCOUNTER — Other Ambulatory Visit: Payer: Self-pay

## 2023-10-02 ENCOUNTER — Encounter (HOSPITAL_COMMUNITY): Payer: Self-pay | Admitting: Surgery

## 2023-10-02 NOTE — Pre-Procedure Instructions (Signed)
-------------    SDW INSTRUCTIONS given:  Your procedure is scheduled on 5/6.  Report to [facility] Main Entrance "A" at 11:30 A.M., and check in at the Admitting office.  Any questions or running late day of surgery: call [phone]    Remember:  Do not eat after midnight the night before your surgery  You may drink clear liquids until 11:00 AM the morning of your surgery.   Clear liquids allowed are: Water, Non-Citrus Juices (without pulp), Carbonated Beverages, Clear Tea, Black Coffee Only, and Gatorade    Take these medicines the morning of surgery with A SIP OF WATER  symbicort , crestor        May take these medicines IF NEEDED: xanax , tramadol    As of today, STOP taking any Aspirin  (unless otherwise instructed by your surgeon) Aleve, Naproxen, Ibuprofen , Motrin , Advil , Goody's, BC's, all herbal medications, fish oil, and all vitamins.   Do NOT Smoke (Tobacco/Vaping) 24 hours prior to your procedure  If you use a CPAP at night, you may bring all equipment for your overnight stay.     You will be asked to remove any contacts, glasses, piercing's, hearing aid's, dentures/partials prior to surgery. Please bring cases for these items if needed.     Patients discharged the day of surgery will not be allowed to drive home, and someone needs to stay with them for 24 hours.  SURGICAL WAITING ROOM VISITATION Patients may have no more than 2 support people in the waiting area - these visitors may rotate.   Pre-op nurse will coordinate an appropriate time for 1 ADULT support person, who may not rotate, to accompany patient in pre-op.  Children under the age of 38 must have an adult with them who is not the patient and must remain in the main waiting area with an adult.  If the patient needs to stay at the hospital during part of their recovery, the visitor guidelines for inpatient rooms apply.  Please refer to the [facility] website for the visitor guidelines for any additional  information.   Special instructions:   [place]- Preparing For Surgery   Please follow these instructions carefully.   Shower the NIGHT BEFORE SURGERY and the MORNING OF SURGERY with DIAL Soap.   Pat yourself dry with a CLEAN TOWEL.  Wear CLEAN PAJAMAS to bed the night before surgery  Place CLEAN SHEETS on your bed the night of your first shower and DO NOT SLEEP WITH PETS.   Additional instructions for the day of surgery: DO NOT APPLY any lotions, deodorants, cologne, or perfumes.   Do not wear jewelry or makeup Do not wear nail polish, gel polish, artificial nails, or any other type of covering on natural nails (fingers and toes) Do not bring valuables to the hospital. [facility] is not responsible for valuables/personal belongings. Put on clean/comfortable clothes.  Please brush your teeth.  Ask your nurse before applying any prescription medications to the skin.

## 2023-10-02 NOTE — Progress Notes (Signed)
 PCP - Dr. Sandra Crouch Cardiologist - Dr. Alexandria Angel  PPM/ICD - denies   Chest x-ray - 09/04/20 EKG - 03/06/23 Stress Test - 11/11/18 ECHO - 02/25/19 Cardiac Cath - 10/17/22  CPAP - OSA+, CPAP nightly, pt unsure of pressure settings  DM- denies  ASA/Blood Thinner Instructions: n/a   ERAS Protcol - clears until 1100  COVID TEST- n/a  Anesthesia review: yes, cardiac hx. Pt took nitroglycerin  on 09/30/23. He has not needed to take it since. Pt was advised to call if he needed to take it prior to surgery  Patient verbally denies any shortness of breath, fever, cough and chest pain during phone call      Questions were answered. Patient verbalized understanding of instructions.

## 2023-10-05 ENCOUNTER — Encounter (HOSPITAL_COMMUNITY): Payer: Self-pay | Admitting: Surgery

## 2023-10-05 NOTE — Anesthesia Preprocedure Evaluation (Signed)
 Anesthesia Evaluation  Patient identified by MRN, date of birth, ID band Patient awake    Reviewed: Allergy & Precautions, H&P , NPO status , Patient's Chart, lab work & pertinent test results  Airway Mallampati: II   Neck ROM: full    Dental   Pulmonary asthma , sleep apnea , former smoker   breath sounds clear to auscultation       Cardiovascular hypertension, + CAD   Rhythm:regular Rate:Normal  Cardiac cath 10/17/22:   Dist LAD lesion is 30% stenosed.   Ost Cx to Prox Cx lesion is 30% stenosed. 1.  Mild obstructive coronary artery disease. 2.  LVEDP of 26 to 28 mmHg; the patient will be started on p.o. diuretics. Recommendation: Medical therapy.    Neuro/Psych  Headaches PSYCHIATRIC DISORDERS Anxiety Depression       GI/Hepatic PUD,,,  Endo/Other    Renal/GU      Musculoskeletal  (+) Arthritis ,    Abdominal   Peds  Hematology   Anesthesia Other Findings   Reproductive/Obstetrics                             Anesthesia Physical Anesthesia Plan  ASA: 3  Anesthesia Plan: General   Post-op Pain Management:    Induction: Intravenous  PONV Risk Score and Plan: 2 and Ondansetron , Dexamethasone , Midazolam  and Treatment may vary due to age or medical condition  Airway Management Planned: Oral ETT  Additional Equipment:   Intra-op Plan:   Post-operative Plan: Extubation in OR  Informed Consent: I have reviewed the patients History and Physical, chart, labs and discussed the procedure including the risks, benefits and alternatives for the proposed anesthesia with the patient or authorized representative who has indicated his/her understanding and acceptance.     Dental advisory given  Plan Discussed with: CRNA, Anesthesiologist and Surgeon  Anesthesia Plan Comments: (PAT note written 10/05/2023 by Allison Zelenak, PA-C.  )       Anesthesia Quick Evaluation

## 2023-10-05 NOTE — Progress Notes (Signed)
 Anesthesia Chart Review: SAME DAY WORK-UP  Case: [id number] Date/Time: 10/06/23 1345   Procedure: REPAIR, HERNIA, UMBILICAL, ADULT - WITH MESH   Anesthesia type: General   Diagnosis: Umbilical hernia without obstruction or gangrene [K42.9]   Pre-op diagnosis: UMBILICAL HERNIA   [facility] ROOM [facility] / [facility] OR   Surgeons: Sim Dryer, MD       DISCUSSION: Patient is a 58 year old male scheduled for the above procedure. Case was moved from  to main [facility] to [facility] Main OR by anesthesiologist Dr. Annabell Key given use of nitroglycerin  x1 on 09/30/23 when chest hurt after lifting something heavy. Of note, he has known chest pain with 10/17/22 showing mild CAD (30% dLAD, 30% pCX). At 03/06/23 cardiology follow-up, Dr. Audery Blazing noted patient with recurrent chest pains, but with recent cardiac cath revealing mild non-obstructive CAD and with stable EKG, "Will not pursue further ischemia evaluation" at that time.  History includes former smoker (quit 10/04/22), HTN, HLD, CAD (30% dLAD, pCX 10/17/22), OSA (uses CPAP), asthma, DVT/PE (LLE DVT & saddle PE 11/26/11), cluster headaches.   Per 03/06/23 cardiology note by Dr. Audery Blazing, "Cardiac catheterization 2007 showed normal coronary arteries with distal LAD intramyocardial bridge without obstruction. Echocardiogram September 2020 showed normal LV function, trace mitral regurgitation, mild tricuspid regurgitation. Coronary CTA April 2024 showed 25 to 49% left main, minimal plaque in the LAD, calcified 50 to 69% stenosis in the circumflex; calcium  score 123 which is 91st percentile.  FFR suggested obstructive disease in the ostial circumflex.  Cardiac catheterization May 2024 showed 30% distal LAD and 30% ostial to proximal circumflex; elevated left ventricular end-diastolic pressure 26 to 28 mmHg.  Since last seen he has occasional chest pain when he feels stressed or excited.  This typically lasts 5 minutes and resolves and is unchanged to improved.  He does not have dyspnea  or syncope." Per A/P: "chest pain-patient has had difficulties with recurrent chest pain.  He had a recent cardiac catheterization revealing nonobstructive coronary disease.  Electrocardiogram shows no new ST changes.  Will not pursue further ischemia evaluation." Continue ASA and statin. 12 month follow-up planned.   Anesthesia team to evaluate on the day of surgery.   VS: Ht 5\' 7"  (1.702 m)   Wt 113.4 kg   BMI 39.16 kg/m  BP Readings from Last 3 Encounters:  09/04/23 (!) 142/86  07/30/23 (!) 140/86  07/20/23 136/84   Pulse Readings from Last 3 Encounters:  07/30/23 93  07/20/23 94  06/10/23 81     PROVIDERS: Arcadio Knuckles, MD is PCP  Alexandria Angel, MD is cardiologist   LABS: For updated labs on the day of surgery as indicated. Last results in [facility] include: Lab Results  Component Value Date   WBC 10.5 07/30/2023   HGB 13.4 07/30/2023   HCT 40.9 07/30/2023   PLT 237.0 07/30/2023   GLUCOSE 97 07/30/2023   CHOL 120 03/25/2023   TRIG 58.0 03/25/2023   HDL 46.10 03/25/2023   LDLCALC 63 03/25/2023   ALT 19 07/30/2023   AST 16 07/30/2023   NA 144 07/30/2023   K 4.2 07/30/2023   CL 107 07/30/2023   CREATININE 1.03 07/30/2023   BUN 11 07/30/2023   CO2 27 07/30/2023   TSH 1.39 07/30/2023   PSA 0.39 07/30/2023     IMAGES: CT Chest 09/29/22 (over read CCTA): IMPRESSION: 1. Subpleural 3 mm left lower lobe nodule is stable from 2022 exam and considered definitively benign. Per Fleischner Society guidelines, no further  imaging follow-up is recommended. 2.  Aortic Atherosclerosis (ICD10-I70.0).   EKG: 03/05/24: Normal sinus rhythm Normal ECG When compared with ECG of 04-Sep-2020 11:59, No significant change was found Confirmed by Alexandria Angel ([id number]) on 03/06/2023 10:29:52 AM   CV: Cardiac cath 10/17/22:   Dist LAD lesion is 30% stenosed.   Ost Cx to Prox Cx lesion is 30% stenosed. 1.  Mild obstructive coronary artery disease. 2.  LVEDP of 26 to 28 mmHg;  the patient will be started on p.o. diuretics. Recommendation: Medical therapy.   CCTA with FFA 09/29/22: IMPRESSION: 1. Coronary calcium  score of 123. This was 91st percentile for age and sex matched control. 2. Total plaque volume 132mm3 which is 34th percentile for age and sex-matched controls (calcified plaque 38mm3; noncalcified plaque 77mm3). TPV is moderate 3. Normal coronary origin with right dominance. 4. Calcified plaque in ostial LCX causes moderate (50-69%) stenosis 5. Mixed plaque in left main causes mild (25-49%) stenosis. There are high risk plaque features including positive remodeling, low attenuation plaque, and spotty calcification. 6. Noncalcified plaque in proximal LAD causes minimal (0-24%) stenosis 7. Will send study for CTFFR - CAD-RADS 3. Moderate stenosis. Consider symptom-guided anti-ischemic pharmacotherapy as well as risk factor modification per guideline directed care. Additional analysis with CT FFR will be submitted.  FFR 09/29/22: 1. Left Main: No significant stenosis 2. LAD: No significant stenosis 3. LCX: CTFFR 0.75 across lesion in ostial LCX, suggesting lesion is functionally significant 4. RCA: No significant stenosis IMPRESSION: 1.  CTFFR suggests obstructive CAD in ostial LCX    Echo 02/25/19: IMPRESSIONS   1. Left ventricular ejection fraction, by visual estimation, is 50 to  55%. The left ventricle has normal function. Normal left ventricular size.  There is no left ventricular hypertrophy.   2. Global right ventricle has normal systolic function.The right  ventricular size is normal.   3. Left atrial size was normal.   4. Right atrial size was normal.   5. The mitral valve is normal in structure. Trace mitral valve  regurgitation. No evidence of mitral stenosis.   6. The tricuspid valve is normal in structure. Tricuspid valve  regurgitation is mild.   7. The aortic valve is normal in structure. Aortic valve regurgitation  was not  visualized by color flow Doppler. Structurally normal aortic  valve, with no evidence of sclerosis or stenosis.   8. The pulmonic valve was grossly normal. Pulmonic valve regurgitation is  not visualized by color flow Doppler.   9. Low normal LV systolic function.     Past Medical History:  Diagnosis Date   Anxiety    Arthritis    knees - no med   Cluster headache    Hx - resolved per patient   Depression    DVT of lower extremity (deep venous thrombosis) (HCC) 6/26-28/2013   Xarelto  - resolved - history   HTN (hypertension)    currently no meds   Irritability and anger    PMH of   Kidney stone    passed stone, no surgery required.   Mild intermittent asthma without complication 11/13/2020   Rotator cuff arthropathy 2007   right   Saddle pulmonary embolus (HCC) 6/26-28/2013   post orthopedic surgery - resolved   Sleep apnea 09/2013   moderate OSA-has cpap but does not use it    Past Surgical History:  Procedure Laterality Date   arthroscopic knee surgery  10/09/2011   Dr Lucienne Ryder   CARDIAC CATHETERIZATION  06/02/2005   LVdysfunction; normal  coronaries   COLONOSCOPY     EXTRACORPOREAL SHOCK WAVE LITHOTRIPSY Left 09/05/2022   Procedure: EXTRACORPOREAL SHOCK WAVE LITHOTRIPSY (ESWL);  Surgeon: Christina Coyer, MD;  Location[facility];  Service: Urology;  Laterality: Left;   EXTRACORPOREAL SHOCK WAVE LITHOTRIPSY Left 11/21/2022   Procedure: EXTRACORPOREAL SHOCK WAVE LITHOTRIPSY (ESWL);  Surgeon: Christina Coyer, MD;  Location: [facility];  Service: Urology;  Laterality: Left;   KNEE ARTHROSCOPY WITH MEDIAL MENISECTOMY Left 09/20/2014   medial menisectomy chondroplastytella medial plica excision  ;  Surgeon: Neil Balls, MD;  Location: [facility];  Service: Orthopedics;  Laterality: Left;   LEFT HEART CATH AND CORONARY ANGIOGRAPHY N/A 10/17/2022   Procedure: LEFT HEART CATH AND CORONARY ANGIOGRAPHY;  Surgeon: Kyra Phy,  MD;  [facility] INV[facility];  Service: Cardiovascular;  Laterality: N/A;   ROTATOR CUFF REPAIR  06/02/2005   right   UPPER GASTROINTESTINAL ENDOSCOPY  06/02/2005   normal   WISDOM TOOTH EXTRACTION      MEDICATIONS: No current facility-administered medications for this encounter.    ALPRAZolam  (XANAX ) 0.5 MG tablet   budesonide -formoterol  (SYMBICORT ) 160-4.5 MCG/ACT inhaler   Cholecalciferol  (VITAMIN D3) 50 MCG (2000 UT) capsule   Multiple Vitamin (MULTIVITAMIN WITH MINERALS) TABS tablet   nitroGLYCERIN  (NITROSTAT ) 0.3 MG SL tablet   rosuvastatin  (CRESTOR ) 40 MG tablet   traMADol  (ULTRAM ) 50 MG tablet   fluticasone  (FLONASE ) 50 MCG/ACT nasal spray   furosemide  (LASIX ) 20 MG tablet   ibuprofen  (ADVIL ) 800 MG tablet   modafinil  (PROVIGIL ) 100 MG tablet   omeprazole  (PRILOSEC) 40 MG capsule   tadalafil  (CIALIS ) 5 MG tablet    Ella Gun, PA-C Surgical Short Stay/Anesthesiology [facility] Phone [phone] [facility] Phone [phone] 10/05/2023 1:21 PM

## 2023-10-06 ENCOUNTER — Ambulatory Visit (HOSPITAL_COMMUNITY): Payer: Self-pay | Admitting: Vascular Surgery

## 2023-10-06 ENCOUNTER — Other Ambulatory Visit: Payer: Self-pay

## 2023-10-06 ENCOUNTER — Ambulatory Visit (HOSPITAL_COMMUNITY)
Admission: RE | Admit: 2023-10-06 | Discharge: 2023-10-06 | Disposition: A | Discharge status: Home / Self Care | Attending: Surgery | Admitting: Surgery

## 2023-10-06 ENCOUNTER — Encounter (HOSPITAL_COMMUNITY)
Admission: RE | Disposition: A | Discharge status: Home / Self Care | Payer: Self-pay | Source: Home / Self Care | Attending: Surgery

## 2023-10-06 ENCOUNTER — Encounter (HOSPITAL_COMMUNITY): Payer: Self-pay | Admitting: Surgery

## 2023-10-06 ENCOUNTER — Ambulatory Visit (HOSPITAL_BASED_OUTPATIENT_CLINIC_OR_DEPARTMENT_OTHER): Payer: Self-pay | Admitting: Vascular Surgery

## 2023-10-06 DIAGNOSIS — K420 Umbilical hernia with obstruction, without gangrene: Secondary | ICD-10-CM | POA: Insufficient documentation

## 2023-10-06 DIAGNOSIS — K429 Umbilical hernia without obstruction or gangrene: Secondary | ICD-10-CM | POA: Diagnosis present

## 2023-10-06 DIAGNOSIS — Z87891 Personal history of nicotine dependence: Secondary | ICD-10-CM | POA: Insufficient documentation

## 2023-10-06 DIAGNOSIS — Z8711 Personal history of peptic ulcer disease: Secondary | ICD-10-CM | POA: Insufficient documentation

## 2023-10-06 DIAGNOSIS — I2510 Atherosclerotic heart disease of native coronary artery without angina pectoris: Secondary | ICD-10-CM

## 2023-10-06 DIAGNOSIS — J45909 Unspecified asthma, uncomplicated: Secondary | ICD-10-CM | POA: Insufficient documentation

## 2023-10-06 DIAGNOSIS — M1990 Unspecified osteoarthritis, unspecified site: Secondary | ICD-10-CM | POA: Insufficient documentation

## 2023-10-06 DIAGNOSIS — I10 Essential (primary) hypertension: Secondary | ICD-10-CM

## 2023-10-06 DIAGNOSIS — G4730 Sleep apnea, unspecified: Secondary | ICD-10-CM | POA: Insufficient documentation

## 2023-10-06 HISTORY — DX: Atherosclerotic heart disease of native coronary artery without angina pectoris: I25.10

## 2023-10-06 HISTORY — DX: Personal history of urinary calculi: Z87.442

## 2023-10-06 HISTORY — PX: UMBILICAL HERNIA REPAIR: SHX196

## 2023-10-06 LAB — BASIC METABOLIC PANEL WITH GFR
Anion gap: 10 (ref 5–15)
BUN: 16 mg/dL (ref 6–20)
CO2: 26 mmol/L (ref 22–32)
Calcium: 9.2 mg/dL (ref 8.9–10.3)
Chloride: 105 mmol/L (ref 98–111)
Creatinine, Ser: 0.87 mg/dL (ref 0.61–1.24)
GFR, Estimated: >60 mL/min (ref >60)
Glucose, Bld: 89 mg/dL (ref 70–99)
Potassium: 4.6 mmol/L (ref 3.5–5.1)
Sodium: 141 mmol/L (ref 135–145)

## 2023-10-06 LAB — CBC
HCT: 44.4 % (ref 39.0–52.0)
Hemoglobin: 14.3 g/dL (ref 13.0–17.0)
MCH: 29.1 pg (ref 26.0–34.0)
MCHC: 32.2 g/dL (ref 30.0–36.0)
MCV: 90.4 fL (ref 80.0–100.0)
Platelets: 214 10*3/uL (ref 150–400)
RBC: 4.91 MIL/uL (ref 4.22–5.81)
RDW: 13.6 % (ref 11.5–15.5)
WBC: 9 10*3/uL (ref 4.0–10.5)
nRBC: 0 % (ref 0.0–0.2)

## 2023-10-06 SURGERY — REPAIR, HERNIA, UMBILICAL, ADULT
Anesthesia: General

## 2023-10-06 MED ORDER — FENTANYL CITRATE (PF) 100 MCG/2ML IJ SOLN
INTRAMUSCULAR | Status: AC
Start: 2023-10-06 — End: 2023-10-06
  Filled 2023-10-06: qty 2

## 2023-10-06 MED ORDER — ORAL CARE MOUTH RINSE: 15.0000 mL | Freq: Once | OROMUCOSAL | Status: AC

## 2023-10-06 MED ORDER — GLYCOPYRROLATE PF 0.2 MG/ML IJ SOSY
PREFILLED_SYRINGE | INTRAMUSCULAR | Status: AC
  Filled 2023-10-06: qty 1

## 2023-10-06 MED ORDER — CELECOXIB 200 MG PO CAPS
200.0000 mg | ORAL_CAPSULE | ORAL | Status: AC
  Administered 2023-10-06: 200 mg via ORAL
  Filled 2023-10-06: qty 1

## 2023-10-06 MED ORDER — CHLORHEXIDINE GLUCONATE 0.12 % MT SOLN
15.0000 mL | Freq: Once | OROMUCOSAL | Status: AC
  Administered 2023-10-06: 15 mL via OROMUCOSAL
  Filled 2023-10-06: qty 15

## 2023-10-06 MED ORDER — ONDANSETRON HCL 4 MG/2ML IJ SOLN: 4.0000 mg | Freq: Four times a day (QID) | INTRAMUSCULAR | Status: DC | PRN

## 2023-10-06 MED ORDER — CEFAZOLIN SODIUM-DEXTROSE 3-4 GM/150ML-% IV SOLN
3.0000 g | INTRAVENOUS | Status: AC
  Administered 2023-10-06: 3 g via INTRAVENOUS
  Filled 2023-10-06: qty 150

## 2023-10-06 MED ORDER — CHLORHEXIDINE GLUCONATE CLOTH 2 % EX PADS: 6.0000 | MEDICATED_PAD | Freq: Once | CUTANEOUS | Status: DC

## 2023-10-06 MED ORDER — DEXAMETHASONE SODIUM PHOSPHATE 10 MG/ML IJ SOLN
INTRAMUSCULAR | Status: DC | PRN
  Administered 2023-10-06: 10 mg via INTRAVENOUS

## 2023-10-06 MED ORDER — GABAPENTIN 300 MG PO CAPS
300.0000 mg | ORAL_CAPSULE | ORAL | Status: AC
  Administered 2023-10-06: 300 mg via ORAL
  Filled 2023-10-06: qty 1

## 2023-10-06 MED ORDER — PHENYLEPHRINE HCL (PRESSORS) 10 MG/ML IV SOLN
INTRAVENOUS | Status: AC
  Filled 2023-10-06: qty 1

## 2023-10-06 MED ORDER — ROCURONIUM BROMIDE 10 MG/ML (PF) SYRINGE
PREFILLED_SYRINGE | INTRAVENOUS | Status: DC | PRN
  Administered 2023-10-06: 100 mg via INTRAVENOUS

## 2023-10-06 MED ORDER — PHENYLEPHRINE 80 MCG/ML (10ML) SYRINGE FOR IV PUSH (FOR BLOOD PRESSURE SUPPORT)
PREFILLED_SYRINGE | INTRAVENOUS | Status: DC | PRN
  Administered 2023-10-06 (×4): 80 ug via INTRAVENOUS

## 2023-10-06 MED ORDER — OXYCODONE HCL 5 MG PO TABS
5.0000 mg | ORAL_TABLET | Freq: Once | ORAL | Status: AC | PRN
  Administered 2023-10-06: 5 mg via ORAL

## 2023-10-06 MED ORDER — DEXAMETHASONE SODIUM PHOSPHATE 10 MG/ML IJ SOLN
INTRAMUSCULAR | Status: AC
  Filled 2023-10-06: qty 1

## 2023-10-06 MED ORDER — ONDANSETRON HCL 4 MG/2ML IJ SOLN
INTRAMUSCULAR | Status: AC
  Filled 2023-10-06: qty 2

## 2023-10-06 MED ORDER — PROPOFOL 10 MG/ML IV BOLUS
INTRAVENOUS | Status: AC
  Filled 2023-10-06: qty 20

## 2023-10-06 MED ORDER — ONDANSETRON HCL 4 MG/2ML IJ SOLN
INTRAMUSCULAR | Status: DC | PRN
  Administered 2023-10-06: 4 mg via INTRAVENOUS

## 2023-10-06 MED ORDER — SUGAMMADEX SODIUM 200 MG/2ML IV SOLN
INTRAVENOUS | Status: DC | PRN
  Administered 2023-10-06: 200 mg via INTRAVENOUS

## 2023-10-06 MED ORDER — 0.9 % SODIUM CHLORIDE (POUR BTL) OPTIME
TOPICAL | Status: DC | PRN
  Administered 2023-10-06: 1000 mL

## 2023-10-06 MED ORDER — PROPOFOL 10 MG/ML IV BOLUS
INTRAVENOUS | Status: DC | PRN
  Administered 2023-10-06: 200 mg via INTRAVENOUS

## 2023-10-06 MED ORDER — OXYCODONE HCL 5 MG PO TABS: 5.0000 mg | ORAL_TABLET | Freq: Four times a day (QID) | ORAL | 0 refills | Status: DC | PRN

## 2023-10-06 MED ORDER — FENTANYL CITRATE (PF) 250 MCG/5ML IJ SOLN
INTRAMUSCULAR | Status: DC | PRN
  Administered 2023-10-06: 100 ug via INTRAVENOUS
  Administered 2023-10-06: 50 ug via INTRAVENOUS

## 2023-10-06 MED ORDER — FENTANYL CITRATE (PF) 250 MCG/5ML IJ SOLN
INTRAMUSCULAR | Status: AC
  Filled 2023-10-06: qty 5

## 2023-10-06 MED ORDER — FENTANYL CITRATE (PF) 100 MCG/2ML IJ SOLN
25.0000 ug | INTRAMUSCULAR | Status: DC | PRN
  Administered 2023-10-06: 50 ug via INTRAVENOUS

## 2023-10-06 MED ORDER — BUPIVACAINE-EPINEPHRINE (PF) 0.25% -1:200000 IJ SOLN
INTRAMUSCULAR | Status: AC
  Filled 2023-10-06: qty 30

## 2023-10-06 MED ORDER — ROCURONIUM BROMIDE 10 MG/ML (PF) SYRINGE
PREFILLED_SYRINGE | INTRAVENOUS | Status: AC
  Filled 2023-10-06: qty 10

## 2023-10-06 MED ORDER — OXYCODONE HCL 5 MG/5ML PO SOLN: 5.0000 mg | Freq: Once | ORAL | Status: AC | PRN

## 2023-10-06 MED ORDER — BUPIVACAINE-EPINEPHRINE 0.25% -1:200000 IJ SOLN
INTRAMUSCULAR | Status: DC | PRN
  Administered 2023-10-06: 1 mL
  Administered 2023-10-06: 23 mL

## 2023-10-06 MED ORDER — LACTATED RINGERS IV SOLN: INTRAVENOUS | Status: DC

## 2023-10-06 MED ORDER — LIDOCAINE 2% (20 MG/ML) 5 ML SYRINGE
INTRAMUSCULAR | Status: AC
  Filled 2023-10-06: qty 5

## 2023-10-06 MED ORDER — MIDAZOLAM HCL 2 MG/2ML IJ SOLN
INTRAMUSCULAR | Status: DC | PRN
  Administered 2023-10-06: 2 mg via INTRAVENOUS

## 2023-10-06 MED ORDER — MIDAZOLAM HCL 2 MG/2ML IJ SOLN
INTRAMUSCULAR | Status: AC
  Filled 2023-10-06: qty 2

## 2023-10-06 MED ORDER — LIDOCAINE 2% (20 MG/ML) 5 ML SYRINGE
INTRAMUSCULAR | Status: DC | PRN
  Administered 2023-10-06: 100 mg via INTRAVENOUS

## 2023-10-06 MED ORDER — ACETAMINOPHEN 500 MG PO TABS
1000.0000 mg | ORAL_TABLET | ORAL | Status: AC
  Administered 2023-10-06: 1000 mg via ORAL
  Filled 2023-10-06: qty 2

## 2023-10-06 MED ORDER — OXYCODONE HCL 5 MG PO TABS
ORAL_TABLET | ORAL | Status: AC
  Filled 2023-10-06: qty 1

## 2023-10-06 SURGICAL SUPPLY — 26 items
BAG COUNTER SPONGE SURGICOUNT (BAG) ×2 IMPLANT
CANISTER SUCT 3000ML PPV (MISCELLANEOUS) IMPLANT
CHLORAPREP W/TINT 26 (MISCELLANEOUS) ×2 IMPLANT
COVER SURGICAL LIGHT HANDLE (MISCELLANEOUS) ×2 IMPLANT
DERMABOND ADVANCED .7 DNX12 (GAUZE/BANDAGES/DRESSINGS) ×2 IMPLANT
DRAPE LAPAROTOMY 100X72 PEDS (DRAPES) ×2 IMPLANT
ELECTRODE REM PT RTRN 9FT ADLT (ELECTROSURGICAL) ×2 IMPLANT
GAUZE SPONGE 4X4 12PLY STRL (GAUZE/BANDAGES/DRESSINGS) IMPLANT
GLOVE BIO SURGEON STRL SZ8 (GLOVE) ×2 IMPLANT
GLOVE BIOGEL PI IND STRL 8 (GLOVE) ×2 IMPLANT
GOWN STRL REUS W/ TWL LRG LVL3 (GOWN DISPOSABLE) ×2 IMPLANT
GOWN STRL REUS W/ TWL XL LVL3 (GOWN DISPOSABLE) ×2 IMPLANT
KIT BASIN OR (CUSTOM PROCEDURE TRAY) ×2 IMPLANT
KIT TURNOVER KIT B (KITS) ×2 IMPLANT
NDL HYPO 25GX1X1/2 BEV (NEEDLE) ×2 IMPLANT
NEEDLE HYPO 25GX1X1/2 BEV (NEEDLE) ×1 IMPLANT
NS IRRIG 1000ML POUR BTL (IV SOLUTION) ×2 IMPLANT
PACK GENERAL/GYN (CUSTOM PROCEDURE TRAY) ×2 IMPLANT
PAD ARMBOARD POSITIONER FOAM (MISCELLANEOUS) ×2 IMPLANT
PENCIL SMOKE EVACUATOR (MISCELLANEOUS) ×2 IMPLANT
SUT MON AB 4-0 PC3 18 (SUTURE) ×2 IMPLANT
SUT NOVA NAB DX-16 0-1 5-0 T12 (SUTURE) ×2 IMPLANT
SUT VIC AB 3-0 SH 27X BRD (SUTURE) ×2 IMPLANT
SYR CONTROL 10ML LL (SYRINGE) ×2 IMPLANT
TOWEL GREEN STERILE (TOWEL DISPOSABLE) ×2 IMPLANT
TOWEL GREEN STERILE FF (TOWEL DISPOSABLE) ×2 IMPLANT

## 2023-10-06 NOTE — Interval H&P Note (Signed)
 History and Physical Interval Note:  10/06/2023 3:02 PM  [name]  has presented today for surgery, with the diagnosis of UMBILICAL HERNIA.  The various methods of treatment have been discussed with the patient and family. After consideration of risks, benefits and other options for treatment, the patient has consented to  Procedure(s) with comments: REPAIR, HERNIA, UMBILICAL, ADULT (N/A) - WITH MESH as a surgical intervention.  The patient's history has been reviewed, patient examined, no change in status, stable for surgery.  I have reviewed the patient's chart and labs.  Questions were answered to the patient's satisfaction.    The risk of hernia repair include bleeding,  Infection,   Recurrence of the hernia,  Mesh use, chronic pain,  Organ injury,  Bowel injury,  Bladder injury,   nerve injury with numbness around the incision,  Death,  and worsening of preexisting  medical problems.  The alternatives to surgery have been discussed as well..  Long term expectations of both operative and non operative treatments have been discussed.   The patient agrees to proceed.  [name] A Ayden Apodaca

## 2023-10-06 NOTE — H&P (Signed)
 History of Present Illness: [name] is a 58 y.o. male who is seen today as an office consultation for evaluation of New Consultation  Patient presents for evaluation of umbilical swelling and pain. He has had swelling there for 2 months. Made worse with lifting or coughing. There is a small bulge that he sometimes will rub to make it go back in. No nausea vomiting.  Review of Systems: A complete review of systems was obtained from the patient. I have reviewed this information and discussed as appropriate with the patient. See HPI as well for other ROS.    Medical History: Past Medical History:  Diagnosis Date  Arthritis  Asthma, unspecified asthma severity, unspecified whether complicated, unspecified whether persistent (HHS-HCC)  DVT (deep venous thrombosis) (CMS/HHS-HCC)  Hypertension  Sleep apnea   There is no problem list on file for this patient.  Past Surgical History:  Procedure Laterality Date  hand surgery Bilateral  Knee surgery  SHOULDER ARTHROCENTESIS    No Known Allergies  Current Outpatient Medications on File Prior to Visit  Medication Sig Dispense Refill  budesonide -formoteroL  (SYMBICORT ) 160-4.5 mcg/actuation inhaler Inhale 2 inhalations into the lungs  fluticasone  propionate (FLONASE ) 50 mcg/actuation nasal spray Place 2 sprays into both nostrils once daily  ibuprofen  (MOTRIN ) 800 MG tablet Take 800 mg by mouth 2 (two) times daily as needed  modafiniL  (PROVIGIL ) 100 MG tablet  nitroGLYcerin  (NITROSTAT ) 0.3 MG SL tablet  omeprazole  (PRILOSEC) 40 MG DR capsule  rosuvastatin  (CRESTOR ) 40 MG tablet Take 40 mg by mouth once daily  traMADoL  (ULTRAM ) 50 mg tablet Take 50 mg by mouth every 12 (twelve) hours as needed   No current facility-administered medications on file prior to visit.   Family History  Problem Relation Age of Onset  Skin cancer Brother  High blood pressure (Hypertension) Brother  Coronary Artery Disease (Blocked arteries around  heart) Brother  Diabetes Brother    Social History   Tobacco Use  Smoking Status Former  Types: Cigarettes  Smokeless Tobacco Never    Social History   Socioeconomic History  Marital status: Married  Tobacco Use  Smoking status: Former  Types: Cigarettes  Smokeless tobacco: Never  Vaping Use  Vaping status: Never Used  Substance and Sexual Activity  Alcohol use: Yes  Drug use: Never   Social Drivers of Health   Received from [employer]  Social Network  Housing Stability: Unknown (08/17/2023)  Housing Stability Vital Sign  Homeless in the Last Year: No   Objective:   Vitals:  08/17/23 0853 08/17/23 0854  BP: (!) 149/91  Pulse: 67  Temp: 36.6 C (97.8 F)  SpO2: 93%  Weight: (!) 119.7 kg (263 lb 12.8 oz)  Height: 170.2 cm (5\' 7" )  PainSc: 8  PainLoc: Abdomen   Body mass index is 41.32 kg/m.  Physical Exam Eyes:  Pupils: Pupils are equal, round, and reactive to light.  Cardiovascular:  Rate and Rhythm: Normal rate.  Pulmonary:  Effort: Pulmonary effort is normal.  Abdominal:  Tenderness: There is abdominal tenderness.  Hernia: A hernia is present. Hernia is present in the umbilical area. There is no hernia in the ventral area.   Comments: 3 cm reducible umbilical hernia  Musculoskeletal:  Cervical back: Normal range of motion.  Skin: General: Skin is warm.  Neurological:  General: No focal deficit present.  Mental Status: He is alert.  Psychiatric:  Mood and Affect: Mood normal.     Assessment and Plan:   Diagnoses and all orders for this  visit:  Umbilical hernia without obstruction or gangrene   Symptomatic umbilical hernia. Discussed the pathophysiology of umbilical hernias. Discussed repair techniques and the use of mesh. Discussed different operative techniques. Recommend open repair of his umbilical hernia with mesh. It measures 3 cm. Risks and benefits of repair reviewed. Risk of bleeding, infection, recurrence, bowel injury,  bladder injury, anesthesia risk, exacerbation of underlying medical problems and need further additional treatments and/or procedures.   Sharlee Dawes, MD

## 2023-10-06 NOTE — Op Note (Signed)
 Preoperative diagnosis: 1 cm umbilical hernia incarcerated  Postoperative diagnosis: Same  Procedure repair of 1 cm incarcerated umbilical hernia  Surgeon: Sim Dryer, MD  Anesthesia: General With 0.25% Marcaine  with epinephrine  local  EBL: Minimal  Specimen: None  Drains: None  Indications for procedure: The patient is a 58 year old male needed a longstanding umbilical hernia.  He was seen in the office and evaluated for repair due to pain.  He presents today for repair.  Today, he does have what feels to be incarcerated piece of preperitoneal fat.  He was not having any pain.  He was brought to the operative room for repair of his umbilical hernia.The risk of hernia repair include bleeding,  Infection,   Recurrence of the hernia,  Mesh use, chronic pain,  Organ injury,  Bowel injury,  Bladder injury,   nerve injury with numbness around the incision,  Death,  and worsening of preexisting  medical problems.  The alternatives to surgery have been discussed as well..  Long term expectations of both operative and non operative treatments have been discussed.   The patient agrees to proceed.   Description of procedure: The patient was placed supine upon the OR table.  After induction of general esthesia, the abdomen was prepped and draped in sterile fashion timeout performed.  Proper patient, site and procedure verified.  Hernia was noted to be at the umbilicus and felt to be incarcerated.  A curvilinear incision was made along the inferior border of the umbilical stalk.  Dissection was carried down there is a 1 cm umbilical hernia with an incarcerated piece of preperitoneal fat.  I enlarged the fascial opening by 5 mm to facilitate reduction of the preperitoneal fat.  This was examined and felt to be viable and was reduced back into the preperitoneal space.  I swept around the fascia circumferentially for about 2 cm to free up the fascia.  Given the small size mesh was not used.  The defect was  repaired with interrupted #1 Novafil suture pop-off's.  This closed the defect nicely.  There is no undue tension.  Reexamination revealed no evidence of bleeding.  Local anesthetic was infiltrated throughout the periumbilical space.  Deep tissue planes were approximately 3-0 Vicryl.  4 Monocryl was used to close the skin in a subcuticular fashion.  Dermabond applied.  All counts found to be correct.  The patient was awoke extubated taken recovery in satisfactory condition.

## 2023-10-06 NOTE — Transfer of Care (Signed)
 Immediate Anesthesia Transfer of Care Note  Patient: [name]  Procedure(s) Performed: REPAIR, HERNIA, UMBILICAL, ADULT  Patient Location: PACU  Anesthesia Type:General  Level of Consciousness: drowsy  Airway & Oxygen Therapy: Patient Spontanous Breathing and Patient connected to face mask oxygen  Post-op Assessment: Report given to RN and Post -op Vital signs reviewed and stable  Post vital signs: Reviewed and stable  Last Vitals:  Vitals Value Taken Time  BP 149/89 10/06/23 1646  Temp 98   Pulse 79 10/06/23 1648  Resp 15 10/06/23 1648  SpO2 91 % 10/06/23 1648  Vitals shown include unfiled device data.  Last Pain:  Vitals:   10/06/23 1157  TempSrc:   PainSc: 0-No pain      Patients Stated Pain Goal: 0 (10/06/23 1130)  Complications: No notable events documented.

## 2023-10-06 NOTE — Progress Notes (Signed)
 The patient states he took Nitroglycerin  x 1 on 10/02/23. The patient states he lifted a heavy object at work and thinks the pain was associated with lifting and not chest pain. Per the patient today's surgery was moved from day surgery to the main hospital duew to taking Nitroglycerin . Dr. Joyce Nixon made aware.

## 2023-10-06 NOTE — Discharge Instructions (Signed)

## 2023-10-06 NOTE — Anesthesia Procedure Notes (Signed)
 Procedure Name: Intubation Date/Time: 10/06/2023 3:21 PM  Performed by: Adolphus Hoops, CRNAPre-anesthesia Checklist: Patient identified, Emergency Drugs available, Suction available and Patient being monitored Patient Re-evaluated:Patient Re-evaluated prior to induction Oxygen Delivery Method: Circle System Utilized Preoxygenation: Pre-oxygenation with 100% oxygen Induction Type: IV induction Ventilation: Mask ventilation without difficulty Laryngoscope Size: Mac and 4 Grade View: Grade I Tube type: Oral Tube size: 7.5 mm Number of attempts: 1 Airway Equipment and Method: Stylet and Oral airway Placement Confirmation: ETT inserted through vocal cords under direct vision, positive ETCO2 and breath sounds checked- equal and bilateral Secured at: 22 cm Tube secured with: Tape Dental Injury: Teeth and Oropharynx as per pre-operative assessment

## 2023-10-07 ENCOUNTER — Encounter (HOSPITAL_COMMUNITY): Payer: Self-pay | Admitting: Surgery

## 2023-10-07 ENCOUNTER — Ambulatory Visit: Payer: No Typology Code available for payment source | Admitting: Nurse Practitioner

## 2023-10-08 NOTE — Anesthesia Postprocedure Evaluation (Signed)
 Anesthesia Post Note  Patient: [name]  Procedure(s) Performed: REPAIR, HERNIA, UMBILICAL, ADULT     Patient location during evaluation: PACU Anesthesia Type: General Level of consciousness: awake and alert Pain management: pain level controlled Vital Signs Assessment: post-procedure vital signs reviewed and stable Respiratory status: spontaneous breathing, nonlabored ventilation, respiratory function stable and patient connected to nasal cannula oxygen Cardiovascular status: blood pressure returned to baseline and stable Postop Assessment: no apparent nausea or vomiting Anesthetic complications: no   No notable events documented.  Last Vitals:  Vitals:   10/06/23 1730 10/06/23 1745  BP: (!) 138/91 (!) 145/96  Pulse: 65 68  Resp: 19 (!) 23  Temp:    SpO2: (!) 89% 93%    Last Pain:  Vitals:   10/06/23 1744  TempSrc:   PainSc: 4                  Willian Harrow

## 2023-10-13 ENCOUNTER — Other Ambulatory Visit: Payer: Self-pay

## 2023-11-11 NOTE — Progress Notes (Signed)
 [facility] [address] [place] [address] Phone: [phone] Subjective:   IBryan Caprio, am serving as a scribe for Dr. Ronnell Coins.  I'm seeing this patient by the request  of:  Arcadio Knuckles, MD  CC: Knee pain  [id number]  09/04/2023 Bilateral injections given today, tolerated the procedure well.  Discussed icing regimen and home exercises, patient's BMI is very close and has 5 more pounds to go until he would be a surgical candidate for the knees.  We discussed we can repeat every 10 weeks if needed but remember that and when we do them it will be at least 3 months until he would have surgery.  Patient understands and will be considering having surgery in the near future and will follow-up with orthopedics when he can.     Updated 11/13/2023 DAYTON KENLEY is a 58 y.o. male coming in with complaint of knee pain. Injections last appointment didn't help much.  Known arthritic changes.  Trying to lose weight but finding it difficult.  Trying to get BMI under 40 and will consider the possibility of surgical intervention.       Past Medical History:  Diagnosis Date   Anxiety    Arthritis    knees - no med   Cluster headache    Hx - resolved per patient   Coronary artery disease    30% dLAD, 30% pLCX 10/17/22   Depression    DVT of lower extremity (deep venous thrombosis) (HCC) 6/26-28/2013   Xarelto  - resolved - history   History of kidney stones    HTN (hypertension)    currently no meds   Irritability and anger    PMH of   Kidney stone    passed stone, no surgery required.   Mild intermittent asthma without complication 11/13/2020   Rotator cuff arthropathy 2007   right   Saddle pulmonary embolus (HCC) 6/26-28/2013   post orthopedic surgery - resolved   Sleep apnea 09/2013   moderate OSA-has cpap but does not use it   Past Surgical History:  Procedure Laterality Date   arthroscopic knee surgery  10/09/2011   Dr  Lucienne Ryder   CARDIAC CATHETERIZATION  06/02/2005   LVdysfunction; normal coronaries   COLONOSCOPY     EXTRACORPOREAL SHOCK WAVE LITHOTRIPSY Left 09/05/2022   Procedure: EXTRACORPOREAL SHOCK WAVE LITHOTRIPSY (ESWL);  Surgeon: Christina Coyer, MD;  Location: [facility];  Service: Urology;  Laterality: Left;   EXTRACORPOREAL SHOCK WAVE LITHOTRIPSY Left 11/21/2022   Procedure: EXTRACORPOREAL SHOCK WAVE LITHOTRIPSY (ESWL);  Surgeon: Christina Coyer, MD;  Location: [facility];  Service: Urology;  Laterality: Left;   KNEE ARTHROSCOPY WITH MEDIAL MENISECTOMY Left 09/20/2014   medial menisectomy chondroplastytella medial plica excision  ;  Surgeon: Neil Balls, MD;  Location: [facility];  Service: Orthopedics;  Laterality: Left;   LEFT HEART CATH AND CORONARY ANGIOGRAPHY N/A 10/17/2022   Procedure: LEFT HEART CATH AND CORONARY ANGIOGRAPHY;  Surgeon: Kyra Phy, MD;  Location: [facility];  Service: Cardiovascular;  Laterality: N/A;   ROTATOR CUFF REPAIR  06/02/2005   right   UMBILICAL HERNIA REPAIR N/A 10/06/2023   Procedure: REPAIR, HERNIA, UMBILICAL, ADULT;  Surgeon: Sim Dryer, MD;  Location: [facility];  Service: General;  Laterality: N/A;  WITH MESH   UPPER GASTROINTESTINAL ENDOSCOPY  06/02/2005   normal   WISDOM TOOTH EXTRACTION     Social History   Socioeconomic History  Marital status: Married    Spouse name: Not on file   Number of children: 6   Years of education: 11   Highest education level: Not on file  Occupational History   Occupation: Location manager    Occupation: Lobbyist  Tobacco Use   Smoking status: Former    Types: Cigars    Quit date: 10/04/2022    Years since quitting: 1.1    Passive exposure: Never   Smokeless tobacco: Never   Tobacco comments:    every 3-4 months  Vaping Use   Vaping status: Never Used  Substance and Sexual Activity   Alcohol use: Not Currently   Drug use: No   Sexual  activity: Yes    Partners: Female    Birth control/protection: Condom  Other Topics Concern   Not on file  Social History Narrative   Fun: Exercise   Social Drivers of Health   Financial Resource Strain: Not on file  Food Insecurity: Not on file  Transportation Needs: Not on file  Physical Activity: Not on file  Stress: Not on file  Social Connections: Unknown (10/11/2021)   Received from [employer]   Social Network    Social Network: Not on file   No Known Allergies Family History  Problem Relation Age of Onset   Cancer Mother        Mesothelioma    Diabetes Brother    Hypertension Brother    Stroke Brother 50   Heart attack Brother 55   Diabetes Maternal Aunt    Diabetes Maternal Grandmother    Heart disease Maternal Grandmother    Hypertension Maternal Grandmother    Stroke Maternal Grandmother        in 34s   Deep vein thrombosis Maternal Grandmother    Stroke Maternal Grandfather        > 55   Migraines Brother    Heart attack Brother 89   Healthy Father    Colon cancer Neg Hx    Esophageal cancer Neg Hx    Rectal cancer Neg Hx    Stomach cancer Neg Hx    Liver cancer Neg Hx    Pancreatic cancer Neg Hx    Other Neg Hx        low testosterone      Current Outpatient Medications (Cardiovascular):    furosemide  (LASIX ) 20 MG tablet, TAKE 1 TABLET BY MOUTH 2 TIMES DAILY. TAKE 1 TABLET DAILY. ( CAN TAKE ADDITIONAL TABLET AS NEEDED).   nitroGLYCERIN  (NITROSTAT ) 0.3 MG SL tablet, PLACE 1 TABLET (0.3 MG TOTAL) UNDER THE TONGUE 2 (TWO) TIMES DAILY AS NEEDED.   rosuvastatin  (CRESTOR ) 40 MG tablet, Take 1 tablet (40 mg total) by mouth daily.   tadalafil  (CIALIS ) 5 MG tablet, Take 1 tablet (5 mg total) by mouth daily. (Patient not taking: Reported on 10/01/2023)  Current Outpatient Medications (Respiratory):    budesonide -formoterol  (SYMBICORT ) 160-4.5 MCG/ACT inhaler, Inhale 2 puffs into the lungs in the morning and at bedtime.   fluticasone  (FLONASE ) 50 MCG/ACT  nasal spray, SPRAY 2 SPRAYS INTO EACH NOSTRIL EVERY DAY (Patient not taking: Reported on 10/01/2023)  Current Outpatient Medications (Analgesics):    ibuprofen  (ADVIL ) 800 MG tablet, Take 1 tablet (800 mg total) by mouth 2 (two) times daily as needed. (Patient not taking: Reported on 10/01/2023)   oxyCODONE  (OXY IR/ROXICODONE ) 5 MG immediate release tablet, Take 1 tablet (5 mg total) by mouth every 6 (six) hours as needed for severe pain (pain score 7-10).  traMADol  (ULTRAM ) 50 MG tablet, Take 1 tablet (50 mg total) by mouth every 12 (twelve) hours as needed.   Current Outpatient Medications (Other):    ALPRAZolam  (XANAX ) 0.5 MG tablet, Take 1 tablet (0.5 mg total) by mouth 2 (two) times daily as needed for anxiety.   Cholecalciferol  (VITAMIN D3) 50 MCG (2000 UT) capsule, Take 2,000 Units by mouth daily.   modafinil  (PROVIGIL ) 100 MG tablet, Take 2 tablets (200 mg total) by mouth daily. (Patient not taking: Reported on 10/01/2023)   Multiple Vitamin (MULTIVITAMIN WITH MINERALS) TABS tablet, Take 1 tablet by mouth daily.   omeprazole  (PRILOSEC) 40 MG capsule, TAKE 1 CAPSULE (40 MG TOTAL) BY MOUTH DAILY. (Patient not taking: Reported on 10/01/2023)   Reviewed prior external information including notes and imaging from  primary care provider As well as notes that were available from care everywhere and other healthcare systems.  Past medical history, social, surgical and family history all reviewed in electronic medical record.  No pertanent information unless stated regarding to the chief complaint.   Review of Systems:  No headache, visual changes, nausea, vomiting, diarrhea, constipation, dizziness, abdominal pain, skin rash, fevers, chills, night sweats, weight loss, swollen lymph nodes, body aches, joint swelling, chest pain, shortness of breath, mood changes. POSITIVE muscle aches  Objective  Blood pressure 122/78, pulse 69, height 5' 7 (1.702 m), weight 267 lb (121.1 kg), SpO2 96%.    General: No apparent distress alert and oriented x3 mood and affect normal, dressed appropriately.  HEENT: Pupils equal, extraocular movements intact  Respiratory: Patient's speak in full sentences and does not appear short of breath  Cardiovascular: No lower extremity edema, non tender, no erythema  Changes of the knees noted bilaterally.  Significant right greater than left.  Small effusion noted on the right as well.   After informed written and verbal consent, patient was seated on exam table. Right knee was prepped with alcohol swab and utilizing anterolateral approach, patient's right knee space was injected with 4:1  marcaine  0.5%: Kenalog  40mg /dL. Patient tolerated the procedure well without immediate complications.  After informed written and verbal consent, patient was seated on exam table. Left knee was prepped with alcohol swab and utilizing anterolateral approach, patient's left knee space was injected with 4:1  marcaine  0.5%: Kenalog  40mg /dL. Patient tolerated the procedure well without immediate complications. Impression and Recommendations:    The above documentation has been reviewed and is accurate and complete Oaklyn Jakubek M Myrtice Lowdermilk, DO

## 2023-11-13 ENCOUNTER — Encounter: Payer: Self-pay | Admitting: Family Medicine

## 2023-11-13 ENCOUNTER — Ambulatory Visit (INDEPENDENT_AMBULATORY_CARE_PROVIDER_SITE_OTHER): Admitting: Family Medicine

## 2023-11-13 VITALS — BP 122/78 | HR 69 | Ht 67.0 in | Wt 267.0 lb

## 2023-11-13 DIAGNOSIS — M170 Bilateral primary osteoarthritis of knee: Secondary | ICD-10-CM | POA: Diagnosis not present

## 2023-11-13 NOTE — Assessment & Plan Note (Addendum)
 Arthritic changes noted.  Discussed icing regimen of home exercises, which activities to do and which ones to avoid.  Patient still needs to get BMI under 40 to consider surgical intervention.  Patient will consider this but still months out from when patient would be able to surgery.  Continues to try to stay active.  Follow-up with me again in 2 to 3 months

## 2023-11-30 NOTE — Progress Notes (Unsigned)
 Cardiology Office Note   Date:  12/01/2023  ID:  [name], DOB December 24, 1965, MRN [id number] PCP: Joshua Debby CROME, MD  [facility] Providers Cardiologist:  Redell Shallow, MD    History of Present Illness [name] is a 58 y.o. male with a past medical history of CAD, hypertension, hyperlipidemia.  Patient followed by Dr. Shallow.  Presents today for chest pain evaluation.  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  Later, echocardiogram in 02/2019 showed normal LV function, trace MR, mild TR.  Coronary CTA in 09/2022 showed 25 to 49% left main, minimal plaque in the LAD, calcified 50 to 69% stenosis in the circumflex; calcium  score 123 which is 91st percentile. FFR suggested obstructive disease in the ostial circumflex.  He underwent left heart catheterization 10/2022 that showed 30% stenosis in distal LAD, 30% stenosis across-proximal circumflex.  Overall had mild nonobstructive CAD.  LVEDP elevated to 26-28 mmHg.  He was started on oral diuretics.  Patient was last seen by Dr. Shallow in 03/2023.  At that time, patient continues to have occasional chest pain when feeling stressed or excited.  Pain typically lasted 5 minutes then resolved on its own.  He remained on aspirin , statin, Lasix   Today, patient presents for evaluation of chest pain.  He reports that he has been having chest pain intermittently throughout the whole month of June.  Pain is sharp, crushing.  Located across his chest and radiates down his arms at times. Pain occurs randomly and is not associated with exertion.  At times if he pushes on a specific spot on his chest he can reproduce the pain.  Also notices that the pain seems to occur when he is emotionally stressed, but pain can happen randomly.  He reports that this is similar to the chest pain that he has had in the past. He has been followed by Dr.  Shallow for episodes of chest pain for years.  Reports that since establishing care with Dr. Shallow, he has not had resolution of pain. Estimates that maybe he has gone a month without the pain in the past. He has not been taking lasix . Denies shortness of breath. Denies orthopnea. Has episodes of lower extremity swelling. Is compliant with his ASA and crestor     Studies Reviewed Cardiac Studies & Procedures   ______________________________________________________________________________________________ CARDIAC CATHETERIZATION  CARDIAC CATHETERIZATION 10/17/2022  Conclusion   Dist LAD lesion is 30% stenosed.   Ost Cx to Prox Cx lesion is 30% stenosed.  1.  Mild obstructive coronary artery disease. 2.  LVEDP of 26 to 28 mmHg; the patient will be started on p.o. diuretics.  Recommendation: Medical therapy.  Findings Coronary Findings Diagnostic  Dominance: Right  Left Anterior Descending Dist LAD lesion is 30% stenosed.  Left Circumflex Ost Cx to Prox Cx lesion is 30% stenosed.  Intervention  No interventions have been documented.   STRESS TESTS  MYOCARDIAL PERFUSION IMAGING 11/11/2018  Narrative  Nuclear stress EF: 51%.  The left ventricular ejection fraction is mildly decreased (45-54%).  T wave inversion was noted during stress in the II, III, aVF, V3, V4, V5 and V6 leads, beginning at 1 minutes of stress, and returning to baseline after 1-5 mins of recovery.  This is a low risk study.   ECHOCARDIOGRAM  ECHOCARDIOGRAM COMPLETE 02/25/2019  Narrative ECHOCARDIOGRAM REPORT    Patient Name:   [name] Date of Exam: 02/25/2019 Medical Rec #:  [id number]          Height:       67.0 in Accession #:    [id number]         Weight:       271.0 lb Date of Birth:  [birth year]           BSA:          2.30 m Patient Age:    53 years           BP:           154/100 mmHg Patient Gender: M                  HR:           84 bpm. Exam Location:  [address]  Procedure: 2D Echo, Cardiac Doppler and Color Doppler  Indications:    R06.00 Dyspnea; R94.30 EF <50%;  History:        Patient has prior history of Echocardiogram examinations, most recent 11/28/2011. Morbid obesity. Obstructive sleep apnea.  Sonographer:    Jon Hacker RCS Referring Phys: [phone] DEBBY LITTIE MOLT  IMPRESSIONS   1. Left ventricular ejection fraction, by visual estimation, is 50 to 55%. The left ventricle has normal function. Normal left ventricular size. There is no left ventricular hypertrophy. 2. Global right ventricle has normal systolic function.The right ventricular size is normal. 3. Left atrial size was normal. 4. Right atrial size was normal. 5. The mitral valve is normal in structure. Trace mitral valve regurgitation. No evidence of mitral stenosis. 6. The tricuspid valve is normal in structure. Tricuspid valve regurgitation is mild. 7. The aortic valve is normal in structure. Aortic valve regurgitation was not visualized by color flow Doppler. Structurally normal aortic valve, with no evidence of sclerosis or stenosis. 8. The pulmonic valve was grossly normal. Pulmonic valve regurgitation is not visualized by color flow Doppler. 9. Low normal LV systolic function.  FINDINGS Left Ventricle: Left ventricular ejection fraction, by visual estimation, is 50 to 55%. The left ventricle has normal function. There is no left ventricular hypertrophy. Normal left ventricular size. Spectral Doppler shows Left ventricular diastolic parameters were normal pattern of LV diastolic filling.  Right Ventricle: The right ventricular size is normal.Global RV systolic function is has normal systolic function.  Left Atrium: Left atrial size was normal in size.  Right Atrium: Right atrial size was normal in size  Pericardium: There is no evidence of pericardial effusion.  Mitral Valve: The mitral valve is normal in structure. No evidence of mitral valve stenosis by  observation. Trace mitral valve regurgitation.  Tricuspid Valve: The tricuspid valve is normal in structure. Tricuspid valve regurgitation is mild by color flow Doppler.  Aortic Valve: The aortic valve is normal in structure. Aortic valve regurgitation was not visualized by color flow Doppler. The aortic valve is structurally normal, with no evidence of sclerosis or stenosis.  Pulmonic Valve: The pulmonic valve was grossly normal. Pulmonic valve regurgitation is not visualized by color flow Doppler.  Aorta: The aortic root, ascending aorta and aortic arch are all structurally normal, with no evidence of dilitation or obstruction.  Venous: The inferior vena cava was not well visualized.  IAS/Shunts: No atrial level shunt detected by color flow Doppler.   LEFT VENTRICLE PLAX 2D LVIDd:         4.20 cm  Diastology LVIDs:         3.00 cm  LV e' lateral:   9.57 cm/s LV  PW:         1.30 cm  LV E/e' lateral: 7.1 LV IVS:        1.10 cm  LV e' medial:    8.70 cm/s LVOT diam:     2.20 cm  LV E/e' medial:  7.8 LV SV:         44 ml LV SV Index:   17.62 LVOT Area:     3.80 cm   RIGHT VENTRICLE RV Basal diam:  1.36 cm RV S prime:     9.57 cm/s TAPSE (M-mode): 1.9 cm  LEFT ATRIUM             Index       RIGHT ATRIUM           Index LA diam:        3.40 cm 1.48 cm/m  RA Area:     10.00 cm LA Vol (A2C):   35.8 ml 15.56 ml/m RA Volume:   17.80 ml  7.74 ml/m LA Vol (A4C):   29.9 ml 12.99 ml/m LA Biplane Vol: 33.9 ml 14.73 ml/m AORTIC VALVE LVOT Vmax:   120.00 cm/s LVOT Vmean:  66.000 cm/s LVOT VTI:    0.230 m  AORTA Ao Root diam: 3.40 cm  MITRAL VALVE                        TRICUSPID VALVE MV Area (PHT): 4.49 cm             TR Peak grad:   25.4 mmHg MV PHT:        49.01 msec           TR Vmax:        252.00 cm/s MV Decel Time: 169 msec MV E velocity: 67.70 cm/s 103 cm/s  SHUNTS MV A velocity: 60.40 cm/s 70.3 cm/s Systemic VTI:  0.23 m MV E/A ratio:  1.12       1.5        Systemic Diam: 2.20 cm   Redell Shallow MD Electronically signed by Redell Shallow MD Signature Date/Time: 02/25/2019/12:57:45 PM    Final      CT SCANS  CT CORONARY MORPH W/CTA COR W/SCORE 09/29/2022  Addendum 10/02/2022  5:39 PM ADDENDUM REPORT: 10/02/2022 17:36  EXAM: OVER-READ INTERPRETATION  CT CHEST  The following report is an over-read performed by radiologist Dr. Andrea Gasman of [facility] on 10/02/2022. This over-read does not include interpretation of cardiac or coronary anatomy or pathology. The coronary CTA interpretation by the cardiologist is attached.  COMPARISON:  Chest CT 09/04/2020  FINDINGS: Vascular: Mild aortic atherosclerosis. The included aorta is normal in caliber.  Mediastinum/nodes: No adenopathy or mass. Unremarkable esophagus.  Lungs: No focal airspace disease. Pleural based left lower lobe nodule series 11, image 30, measures 3 mm and is stable from April 2022 exam. No pleural fluid. The included airways are patent.  Upper abdomen: No acute or unexpected findings.  Musculoskeletal: There are no acute or suspicious osseous abnormalities. Thoracic spondylosis with anterior spurring.  IMPRESSION: 1. Subpleural 3 mm left lower lobe nodule is stable from 2022 exam and considered definitively benign. Per Fleischner Society guidelines, no further imaging follow-up is recommended. 2.  Aortic Atherosclerosis (ICD10-I70.0).   Electronically Signed By: Andrea Gasman M.D. On: 10/02/2022 17:36  Narrative CLINICAL DATA:  31M with chest pain  EXAM: Cardiac/Coronary CTA  TECHNIQUE: The patient was scanned on a [employer].  FINDINGS: A 100  kV prospective scan was triggered in the descending thoracic aorta at 111 HU's. Axial non-contrast 3 mm slices were carried out through the heart. The data set was analyzed on a dedicated work station and scored using the Agatson method. Gantry rotation speed was 250 msecs  and collimation was .6 mm. No beta blockade and 0.8 mg of sl NTG was given. The 3D data set was reconstructed in 5% intervals of the 35-75% of the R-R cycle. Phases were analyzed on a dedicated work station using MPR, MIP and VRT modes. The patient received 100 cc of contrast.  Coronary Arteries:  Normal coronary origin.  Right dominance.  RCA is a large dominant artery that gives rise to PDA and PLA. There is no plaque.  Left main is a large artery that gives rise to LAD and LCX arteries. Mixed plaque in left main causes 25-49% stenosis. High risk plaque features including positive remodeling, low attenuation plaque, and spotty calcification.  LAD is a large vessel. Noncalcified plaque in proximal LAD causes 0-24% stenosis  LCX is a non-dominant artery that gives rise to one large OM1 branch. Calcified plaque in ostial LCX causes 50-69% stenosis  Other findings:  Left Ventricle: Normal size  Left Atrium: Mild enlargement  Pulmonary Veins: Normal configuration  Right Ventricle: Normal size  Right Atrium: Normal size  Cardiac valves: No calcifications  Thoracic aorta: Normal size  Pulmonary Arteries: Normal size  Systemic Veins: Normal drainage  Pericardium: Normal thickness  IMPRESSION: 1. Coronary calcium  score of 123. This was 91st percentile for age and sex matched control.  2. Total plaque volume 149mm3 which is 34th percentile for age and sex-matched controls (calcified plaque 47mm3; noncalcified plaque 28mm3). TPV is moderate  3. Normal coronary origin with right dominance.  4. Calcified plaque in ostial LCX causes moderate (50-69%) stenosis  5. Mixed plaque in left main causes mild (25-49%) stenosis. There are high risk plaque features including positive remodeling, low attenuation plaque, and spotty calcification.  6. Noncalcified plaque in proximal LAD causes minimal (0-24%) stenosis  7. Will send study for CTFFR  CAD-RADS 3. Moderate  stenosis. Consider symptom-guided anti-ischemic pharmacotherapy as well as risk factor modification per guideline directed care. Additional analysis with CT FFR will be submitted.  Electronically Signed: By: Lonni Nanas M.D. On: 09/29/2022 16:04     ______________________________________________________________________________________________      Risk Assessment/Calculations           Physical Exam VS:  BP 124/80   Pulse 75   Ht 5' 7 (1.702 m)   Wt 263 lb 9.6 oz (119.6 kg)   SpO2 94%   BMI 41.29 kg/m        Wt Readings from Last 3 Encounters:  12/01/23 263 lb 9.6 oz (119.6 kg)  11/13/23 267 lb (121.1 kg)  10/06/23 250 lb (113.4 kg)    GEN: Well nourished, well developed in no acute distress. Sitting comfortably on the exam table  NECK: No JVD CARDIAC: RRR, no murmurs, rubs, gallops. Radial pulses 2+ bilaterally  RESPIRATORY:  Clear to auscultation without rales, wheezing or rhonchi. Normal WOB on room air   ABDOMEN: Soft, non-tender, non-distended EXTREMITIES:  No edema in BLE. Wearing compression stockings; No deformity   ASSESSMENT AND PLAN  Chest pain CAD - Heart catheterization in 10/2022 showed 30% distal LAD, 30% Ost-prox circumflex - On exam today, patient reports that he has had episodes of intermittent chest pain throughout the month of June.  Pain occurs randomly, seems to be associated  with emotional stress.  Pain not associated with exertion.  At times, he is able to reproduce the pain when he pushes in the center of his chest.  Pain is somewhat relieved by nitroglycerin  but not completely - Patient has been followed by Dr. Pietro for chest pain for years.  He reports that his current chest pain is similar to what he has had in the past, similar to what he had prior to cath in 10/2022.   - EKG today with normal sinus rhythm, no ischemic changes - Ordered nuclear stress test.  Patient able to walk on treadmill - Ordered echocardiogram - Continue  aspirin  81 mg daily - Continue Crestor  40 mg daily - Discussed starting amlodipine for antianginal benefit but patient would prefer to hold off for now  Hyperlipidemia - LDL 63 in 03/2023 - Continue crestor  40 mg daily   Chronic diastolic CHF - Echo in 02/2019 showed EF 50-55%.  Cath in 10/2022 noted LVEDP elevated to 28 mmHg.  He was started on oral Lasix  at that time but he has not been taking it.  He has occasional lower extremity swelling.  Euvolemic on exam today - Resume Lasix  20 mg twice daily  - Creatinine 0.87 and potassium 4.6 on 10/06/2018  Informed Consent   Shared Decision Making/Informed Consent{  The risks [chest pain, shortness of breath, cardiac arrhythmias, dizziness, blood pressure fluctuations, myocardial infarction, stroke/transient ischemic attack, nausea, vomiting, allergic reaction, radiation exposure, metallic taste sensation and life-threatening complications (estimated to be 1 in 10,000)], benefits (risk stratification, diagnosing coronary artery disease, treatment guidance) and alternatives of a nuclear stress test were discussed in detail with Mr. Martindale and he agrees to proceed.     Dispo: Follow up in 4 weeks with APP   Signed, Rollo FABIENE Louder, PA-C

## 2023-12-01 ENCOUNTER — Ambulatory Visit: Attending: Cardiology | Admitting: Cardiology

## 2023-12-01 ENCOUNTER — Encounter: Payer: Self-pay | Admitting: Cardiology

## 2023-12-01 ENCOUNTER — Telehealth (HOSPITAL_COMMUNITY): Payer: Self-pay | Admitting: *Deleted

## 2023-12-01 VITALS — BP 124/80 | HR 75 | Ht 67.0 in | Wt 263.6 lb

## 2023-12-01 DIAGNOSIS — E785 Hyperlipidemia, unspecified: Secondary | ICD-10-CM

## 2023-12-01 DIAGNOSIS — I5032 Chronic diastolic (congestive) heart failure: Secondary | ICD-10-CM

## 2023-12-01 DIAGNOSIS — I10 Essential (primary) hypertension: Secondary | ICD-10-CM

## 2023-12-01 DIAGNOSIS — R079 Chest pain, unspecified: Secondary | ICD-10-CM | POA: Diagnosis not present

## 2023-12-01 MED ORDER — ROSUVASTATIN CALCIUM 40 MG PO TABS: 40.0000 mg | ORAL_TABLET | Freq: Every day | ORAL | 3 refills | Status: DC

## 2023-12-01 MED ORDER — FUROSEMIDE 20 MG PO TABS: 20.0000 mg | ORAL_TABLET | Freq: Every day | ORAL | 3 refills | Status: DC

## 2023-12-01 NOTE — Telephone Encounter (Signed)
 Patient given detailed instructions per Myocardial Perfusion Study Information Sheet for the test on 12/03/23 Patient notified to arrive 15 minutes early and that it is imperative to arrive on time for appointment to keep from having the test rescheduled.  If you need to cancel or reschedule your appointment, please call the office within 24 hours of your appointment. . Patient verbalized understanding.Claudene Ronal Quale, RN

## 2023-12-01 NOTE — Patient Instructions (Signed)
 Medication Instructions:  Start Lasix  20 mg once a day  *If you need a refill on your cardiac medications before your next appointment, please call your pharmacy*  Lab Work: No labs If you have labs (blood work) drawn today and your tests are completely normal, you will receive your results only by: MyChart Message (if you have MyChart) OR A paper copy in the mail If you have any lab test that is abnormal or we need to change your treatment, we will call you to review the results.  Testing/Procedures: Your physician has requested that you have an echocardiogram. Echocardiography is a painless test that uses sound waves to create images of your heart. It provides your doctor with information about the size and shape of your heart and how well your heart's chambers and valves are working. This procedure takes approximately one hour. There are no restrictions for this procedure. Please do NOT wear cologne, perfume, aftershave, or lotions (deodorant is allowed). Please arrive 15 minutes prior to your appointment time.  Please note: We ask at that you not bring children with you during ultrasound (echo/ vascular) testing. Due to room size and safety concerns, children are not allowed in the ultrasound rooms during exams. Our front office staff cannot provide observation of children in our lobby area while testing is being conducted. An adult accompanying a patient to their appointment will only be allowed in the ultrasound room at the discretion of the ultrasound technician under special circumstances. We apologize for any inconvenience.  Your physician has requested that you have a Treadmill stress myoview .  Please follow instruction sheet, as given.   How to prepare for your Myocardial Perfusion Test: Do not eat or drink 3 hours prior to your test, except you may have water. Do not consume products containing caffeine (regular or decaffeinated) 12 hours prior to your test. (ex: coffee, chocolate,  sodas, tea). Do bring a list of your current medications with you.  If not listed below, you may take your medications as normal. Do wear comfortable clothes (no dresses or overalls) and walking shoes, tennis shoes preferred (No heels or open toe shoes are allowed). Do NOT wear cologne, perfume, aftershave, or lotions (deodorant is allowed). The test will take approximately 3 to 4 hours to complete If these instructions are not followed, your test will have to be rescheduled.  Follow-Up: At [facility], you and your health needs are our priority.  As part of our continuing mission to provide you with exceptional heart care, our providers are all part of one team.  This team includes your primary Cardiologist (physician) and Advanced Practice Providers or APPs (Physician Assistants and Nurse Practitioners) who all work together to provide you with the care you need, when you need it.  Your next appointment:   4 week(s) or After Stress test  Provider:   Rollo Louder, PA-C       We recommend signing up for the patient portal called MyChart.  Sign up information is provided on this After Visit Summary.  MyChart is used to connect with patients for Virtual Visits (Telemedicine).  Patients are able to view lab/test results, encounter notes, upcoming appointments, etc.  Non-urgent messages can be sent to your provider as well.   To learn more about what you can do with MyChart, go to [email or web address].

## 2023-12-02 ENCOUNTER — Ambulatory Visit: Payer: Self-pay | Admitting: Cardiology

## 2023-12-02 ENCOUNTER — Ambulatory Visit (HOSPITAL_COMMUNITY)
Admission: RE | Admit: 2023-12-02 | Discharge: 2023-12-02 | Disposition: A | Discharge status: Home / Self Care | Source: Ambulatory Visit | Attending: Cardiology | Admitting: Cardiology

## 2023-12-02 ENCOUNTER — Other Ambulatory Visit: Payer: Self-pay | Admitting: Cardiology

## 2023-12-02 DIAGNOSIS — R079 Chest pain, unspecified: Secondary | ICD-10-CM

## 2023-12-02 DIAGNOSIS — E785 Hyperlipidemia, unspecified: Secondary | ICD-10-CM

## 2023-12-02 LAB — ECHOCARDIOGRAM COMPLETE
Area-P 1/2: 4.58 cm2
S' Lateral: 3.1 cm

## 2023-12-03 ENCOUNTER — Ambulatory Visit (HOSPITAL_COMMUNITY)
Admission: RE | Admit: 2023-12-03 | Discharge: 2023-12-03 | Disposition: A | Discharge status: Home / Self Care | Source: Ambulatory Visit | Attending: Cardiology | Admitting: Cardiology

## 2023-12-03 ENCOUNTER — Other Ambulatory Visit: Payer: Self-pay | Admitting: Internal Medicine

## 2023-12-03 ENCOUNTER — Ambulatory Visit (INDEPENDENT_AMBULATORY_CARE_PROVIDER_SITE_OTHER): Admitting: Internal Medicine

## 2023-12-03 ENCOUNTER — Encounter: Payer: Self-pay | Admitting: Internal Medicine

## 2023-12-03 VITALS — BP 136/84 | HR 86 | Temp 98.8°F | Ht 67.0 in | Wt 260.8 lb

## 2023-12-03 DIAGNOSIS — I10 Essential (primary) hypertension: Secondary | ICD-10-CM

## 2023-12-03 DIAGNOSIS — R079 Chest pain, unspecified: Secondary | ICD-10-CM | POA: Insufficient documentation

## 2023-12-03 DIAGNOSIS — G4733 Obstructive sleep apnea (adult) (pediatric): Secondary | ICD-10-CM

## 2023-12-03 DIAGNOSIS — I8311 Varicose veins of right lower extremity with inflammation: Secondary | ICD-10-CM

## 2023-12-03 DIAGNOSIS — M47816 Spondylosis without myelopathy or radiculopathy, lumbar region: Secondary | ICD-10-CM

## 2023-12-03 DIAGNOSIS — M7551 Bursitis of right shoulder: Secondary | ICD-10-CM

## 2023-12-03 DIAGNOSIS — M19011 Primary osteoarthritis, right shoulder: Secondary | ICD-10-CM

## 2023-12-03 LAB — MYOCARDIAL PERFUSION IMAGING
Angina Index: 1
Estimated workload: 7
Exercise duration (min): 5 min
Exercise duration (sec): 35 s
LV dias vol: 114 mL (ref 62–150)
LV sys vol: 41 mL (ref 4.2–5.8)
MPHR: 162 {beats}/min
Nuc Stress EF: 64 %
Peak HR: 146 {beats}/min
Percent HR: 90 %
Rest HR: 71 {beats}/min
Rest Nuclear Isotope Dose: 12.5 mCi
SDS: 1
SRS: 0
SSS: 1
Stress Nuclear Isotope Dose: 38.9 mCi
TID: 1.06

## 2023-12-03 MED ORDER — TRAMADOL HCL 50 MG PO TABS: 50.0000 mg | ORAL_TABLET | Freq: Two times a day (BID) | ORAL | 1 refills | Status: AC | PRN

## 2023-12-03 MED ORDER — TECHNETIUM TC 99M TETROFOSMIN IV KIT
12.5000 | PACK | Freq: Once | INTRAVENOUS | Status: AC | PRN
  Administered 2023-12-03: 12.5 via INTRAVENOUS

## 2023-12-03 MED ORDER — TECHNETIUM TC 99M TETROFOSMIN IV KIT
38.9000 | PACK | Freq: Once | INTRAVENOUS | Status: AC | PRN
  Administered 2023-12-03: 38.9 via INTRAVENOUS

## 2023-12-03 MED ORDER — ZEPBOUND 2.5 MG/0.5ML ~~LOC~~ SOAJ: 2.5000 mg | SUBCUTANEOUS | 0 refills | Status: DC

## 2023-12-03 NOTE — Patient Instructions (Signed)
 Tirzepatide Injection (Weight Management) What is this medication? TIRZEPATIDE (tir ZEP a tide) promotes weight loss. It may also be used to maintain weight loss.  It works by decreasing appetite. It can be used to treat sleep apnea. Changes to diet and exercise are often combined with this medication. This medicine may be used for other purposes; ask your health care provider or pharmacist if you have questions. COMMON BRAND NAME(S): Zepbound What should I tell my care team before I take this medication? They need to know if you have any of these conditions: Diabetes Eye disease caused by diabetes Gallbladder disease Have or have had depression Have or have had pancreatitis Having surgery Kidney disease Personal or family history of MEN 2, a condition that causes endocrine gland tumors Personal or family history of thyroid  cancer Stomach or intestine problems, such as problems digesting food Suicidal thoughts, plans, or attempt An unusual or allergic reaction to tirzepatide, other medications, foods, dyes, or preservatives Pregnant or trying to get pregnant Breastfeeding How should I use this medication? This medication is injected under the skin. You will be taught how to prepare and give it. Take it as directed on the prescription label. Keep taking it unless your care team tells you to stop. It is important that you put your used needles and syringes in a special sharps container. Do not put them in a trash can. If you do not have a sharps container, call your pharmacist or care team to get one. A special MedGuide will be given to you by the pharmacist with each prescription and refill. Be sure to read this information carefully each time. This medication comes with INSTRUCTIONS FOR USE. Ask your pharmacist for directions on how to use this medication. Read the information carefully. Talk to your pharmacist or care team if you have questions. Talk to your care team about the use of this  medication in children. Special care may be needed. Overdosage: If you think you have taken too much of this medicine contact a poison control center or emergency room at once. NOTE: This medicine is only for you. Do not share this medicine with others. What if I miss a dose? If you miss a dose, take it as soon as you can unless it is more than 4 days (96 hours) late. If it is more than 4 days late, skip the missed dose. Take the next dose at the normal time. Do not take 2 doses within 3 days (72 hours) of each other. What may interact with this medication? Certain medications for diabetes, such as insulin , glyburide, glipizide This medication may affect how other medications work. Talk with your care team about all of the medications you take. They may suggest changes to your treatment plan to lower the risk of side effects and to make sure your medications work as intended. This list may not describe all possible interactions. Give your health care provider a list of all the medicines, herbs, non-prescription drugs, or dietary supplements you use. Also tell them if you smoke, drink alcohol, or use illegal drugs. Some items may interact with your medicine. What should I watch for while using this medication? Visit your care team for regular checks on your progress. Tell your care team if your condition does not start to get better or if it gets worse. Tell your care team if you are taking medication to treat diabetes, such as insulin  or glipizide. This may increase your risk of low blood sugar. Know the  symptoms of low blood sugar and how to treat it. Talk to your care team about your risk of cancer. You may be more at risk for certain types of cancer if you take this medication. Talk to your care team right away if you have a lump or swelling in your neck, hoarseness that does not go away, trouble swallowing, shortness of breath, or trouble breathing. Make sure you stay hydrated while taking this  medication. Drink water often. Eat fruits and veggies that have a high water content. Drink more water when it is hot or you are active. Talk to your care team right away if you have fever, infection, vomiting, diarrhea, or if you sweat a lot while taking this medication. The loss of too much body fluid may make it dangerous for you to take this medication. If you are going to need surgery or a procedure, tell your care team that you are taking this medication. Estrogen and progestin hormones that you take by mouth may not work as well while you are taking this medication. Switch to a non-oral contraceptive or add a barrier contraceptive for 4 weeks after starting this medication and after each dose increase. Talk to your care team about contraceptive options. They can help you find the option that works for you. Do not take this medication without first talking to your care team if you may be or could become pregnant. Your care team can help you find the option that works for you. Weight loss is not recommended during pregnancy. Talk to your care team if you are breastfeeding. When recommended, this medication may be taken. Its use during breastfeeding has not been well studied. Your care team may suggest other options. What side effects may I notice from receiving this medication? Side effects that you should report to your care team as soon as possible: Allergic reactions--skin rash, itching, hives, swelling of the face, lips, tongue, or throat Change in vision Dehydration--increased thirst, dry mouth, feeling faint or lightheaded, headache, dark yellow or brown urine Fast or irregular heartbeat Gallbladder problems--severe stomach pain, nausea, vomiting, fever Kidney injury--decrease in the amount of urine, swelling of the ankles, hands, or feet Pancreatitis--severe stomach pain that spreads to your back or gets worse after eating or when touched, fever, nausea, vomiting Thoughts of suicide or  self-harm, worsening mood, feelings of depression Thyroid  cancer--new mass or lump in the neck, pain or trouble swallowing, trouble breathing, hoarseness Side effects that usually do not require medical attention (report these to your care team if they continue or are bothersome): Constipation Diarrhea Loss of appetite Nausea Upset stomach This list may not describe all possible side effects. Call your doctor for medical advice about side effects. You may report side effects to FDA at 1-800-FDA-1088. Where should I keep my medication? Keep out of the reach of children and pets. Store in a refrigerator or at room temperature up to 30 degrees C (86 degrees F). Keep it in the original container. Protect from light. Refrigeration (preferred): Store in the refrigerator. Do not freeze. Get rid of any unused medication after the expiration date. Room temperature: This medication may be stored at room temperature for up to 21 days. If it is stored at room temperature, get rid of any unused medication after 21 days or after it expires, whichever is first. To get rid of medications that are no longer needed or have expired: Take the medication to a medication take-back program. Check with your pharmacy or law enforcement  to find a location. If you cannot return the medication, ask your pharmacist or care team how to get rid of this medication safely. NOTE: This sheet is a summary. It may not cover all possible information. If you have questions about this medicine, talk to your doctor, pharmacist, or health care provider.  2025 Elsevier/Gold Standard (2023-06-16 00:00:00)

## 2023-12-03 NOTE — Progress Notes (Signed)
 Subjective:  Patient ID: [name], male    DOB: 1965/10/13  Age: 58 y.o. MRN: [id number]  CC: Hypertension and Osteoarthritis   HPI [name] presents for f/up  ---  Discussed the use of AI scribe software for clinical note transcription with the patient, who gave verbal consent to proceed.  History of Present Illness   [name] is a 58 year old male with hypertension and sleep apnea who presents with chest pain.  He has experienced intermittent chest pain over the past few days, which led to visits to his cardiologist. He underwent a stress test today and another unspecified test yesterday, with results pending. Currently, he is not experiencing chest pain and notes that his breathing difficulties are intermittent.  He has a history of sleep apnea and uses a CPAP machine, which fits well. He is interested in weight loss medications, specifically mentioning Zepbound, and discusses its potential benefits for sleep apnea.  He mentions a significant drop in his blood pressure, which has been consistently high for the past twenty years. Recently, it has been lower, around 130s, compared to his usual 120s. No weakness or dizziness is reported despite the change. He continues to take a diuretic.  He is currently out of tramadol , which he uses for pain management, and requests a refill. He no longer uses an inhaler.       Outpatient Medications Prior to Visit  Medication Sig Dispense Refill   ALPRAZolam  (XANAX ) 0.5 MG tablet Take 1 tablet (0.5 mg total) by mouth 2 (two) times daily as needed for anxiety. 180 tablet 0   budesonide -formoterol  (SYMBICORT ) 160-4.5 MCG/ACT inhaler Inhale 2 puffs into the lungs in the morning and at bedtime. 1 each 6   Cholecalciferol  (VITAMIN D3) 50 MCG (2000 UT) capsule Take 2,000 Units by mouth daily.     fluticasone  (FLONASE ) 50 MCG/ACT nasal spray SPRAY 2 SPRAYS INTO EACH NOSTRIL EVERY DAY 16 mL 2   furosemide  (LASIX ) 20 MG tablet  Take 1 tablet (20 mg total) by mouth daily. 90 tablet 3   ibuprofen  (ADVIL ) 800 MG tablet Take 1 tablet (800 mg total) by mouth 2 (two) times daily as needed. 60 tablet 3   modafinil  (PROVIGIL ) 100 MG tablet Take 2 tablets (200 mg total) by mouth daily. 60 tablet 1   Multiple Vitamin (MULTIVITAMIN WITH MINERALS) TABS tablet Take 1 tablet by mouth daily.     nitroGLYCERIN  (NITROSTAT ) 0.3 MG SL tablet PLACE 1 TABLET (0.3 MG TOTAL) UNDER THE TONGUE 2 (TWO) TIMES DAILY AS NEEDED. 25 tablet 2   omeprazole  (PRILOSEC) 40 MG capsule TAKE 1 CAPSULE (40 MG TOTAL) BY MOUTH DAILY. 90 capsule 1   rosuvastatin  (CRESTOR ) 40 MG tablet Take 1 tablet (40 mg total) by mouth daily. 90 tablet 3   tadalafil  (CIALIS ) 5 MG tablet Take 1 tablet (5 mg total) by mouth daily. 90 tablet 1   oxyCODONE  (OXY IR/ROXICODONE ) 5 MG immediate release tablet Take 1 tablet (5 mg total) by mouth every 6 (six) hours as needed for severe pain (pain score 7-10). 15 tablet 0   traMADol  (ULTRAM ) 50 MG tablet Take 1 tablet (50 mg total) by mouth every 12 (twelve) hours as needed. 30 tablet 2   No facility-administered medications prior to visit.    ROS Review of Systems  Constitutional:  Negative for appetite change, chills, diaphoresis, fatigue and fever.  HENT: Negative.    Respiratory:  Positive for apnea. Negative for cough, chest tightness, shortness  of breath and wheezing.   Cardiovascular:  Negative for chest pain, palpitations and leg swelling.  Gastrointestinal: Negative.  Negative for abdominal pain, constipation, diarrhea and nausea.  Genitourinary: Negative.  Negative for difficulty urinating.  Musculoskeletal:  Positive for arthralgias, back pain and gait problem. Negative for joint swelling.  Neurological:  Negative for weakness.  Hematological:  Negative for adenopathy. Does not bruise/bleed easily.  Psychiatric/Behavioral: Negative.      Objective:  BP 136/84 (BP Location: Left Arm, Patient Position: Sitting, Cuff  Size: Large)   Pulse 86   Temp 98.8 F (37.1 C) (Temporal)   Ht 5' 7 (1.702 m)   Wt 260 lb 12.8 oz (118.3 kg)   SpO2 97%   BMI 40.85 kg/m   BP Readings from Last 3 Encounters:  12/03/23 136/84  12/01/23 124/80  11/13/23 122/78    Wt Readings from Last 3 Encounters:  12/03/23 260 lb 12.8 oz (118.3 kg)  12/03/23 263 lb (119.3 kg)  12/01/23 263 lb 9.6 oz (119.6 kg)    Physical Exam Vitals reviewed.  Constitutional:      Appearance: Normal appearance.  HENT:     Mouth/Throat:     Mouth: Mucous membranes are moist.  Eyes:     General: No scleral icterus.    Conjunctiva/sclera: Conjunctivae normal.  Cardiovascular:     Rate and Rhythm: Normal rate.     Heart sounds: No murmur heard.    No friction rub. No gallop.  Pulmonary:     Effort: Pulmonary effort is normal.     Breath sounds: No stridor. No wheezing, rhonchi or rales.  Abdominal:     General: Abdomen is flat.     Palpations: There is no mass.     Tenderness: There is no abdominal tenderness. There is no guarding.     Hernia: No hernia is present.  Musculoskeletal:        General: Normal range of motion.     Cervical back: Neck supple.     Right lower leg: No edema.     Left lower leg: No edema.  Lymphadenopathy:     Cervical: No cervical adenopathy.  Skin:    General: Skin is warm and dry.  Neurological:     General: No focal deficit present.     Mental Status: He is alert.  Psychiatric:        Mood and Affect: Mood normal.        Behavior: Behavior normal.     Lab Results  Component Value Date   WBC 9.0 10/06/2023   HGB 14.3 10/06/2023   HCT 44.4 10/06/2023   PLT 214 10/06/2023   GLUCOSE 89 10/06/2023   CHOL 120 03/25/2023   TRIG 58.0 03/25/2023   HDL 46.10 03/25/2023   LDLCALC 63 03/25/2023   ALT 19 07/30/2023   AST 16 07/30/2023   NA 141 10/06/2023   K 4.6 10/06/2023   CL 105 10/06/2023   CREATININE 0.87 10/06/2023   BUN 16 10/06/2023   CO2 26 10/06/2023   TSH 1.39 07/30/2023    PSA 0.39 07/30/2023   INR CANCELED 08/12/2013   HGBA1C 5.7 (H) 12/22/2019    No results found.  Assessment & Plan:  OSA (obstructive sleep apnea) -     Zepbound; Inject 2.5 mg into the skin once a week.  Dispense: 2 mL; Refill: 0  Essential hypertension- His BP is well controlled.  Varicose veins of right lower extremity with inflammation -     Ambulatory referral  to Vascular Surgery  Arthritis of right acromioclavicular joint -     traMADol  HCl; Take 1 tablet (50 mg total) by mouth every 12 (twelve) hours as needed.  Dispense: 180 tablet; Refill: 1  Subacromial bursitis of right shoulder joint -     traMADol  HCl; Take 1 tablet (50 mg total) by mouth every 12 (twelve) hours as needed.  Dispense: 180 tablet; Refill: 1  Facet arthropathy, lumbar -     traMADol  HCl; Take 1 tablet (50 mg total) by mouth every 12 (twelve) hours as needed.  Dispense: 180 tablet; Refill: 1     Follow-up: Return in about 6 months (around 06/04/2024).  Debby Molt, MD

## 2023-12-07 ENCOUNTER — Encounter: Payer: Self-pay | Admitting: Cardiology

## 2023-12-07 ENCOUNTER — Telehealth: Payer: Self-pay | Admitting: Cardiology

## 2023-12-07 NOTE — Telephone Encounter (Signed)
 Left message for patient to call back

## 2023-12-07 NOTE — Telephone Encounter (Signed)
 Left message for patient informing him Rollo Louder, PA-C wrote a letter clearing him to return to work with no restrictions effective 12/02/23.   Copy of letter sent to patient via MyChart message for him to print for his employer. Provided office number for callback if any questions.

## 2023-12-07 NOTE — Telephone Encounter (Signed)
Patient returned RN Carlyle's call.

## 2023-12-07 NOTE — Telephone Encounter (Signed)
 Patient called stating a letter was supposed to be sent to his employer stating he can returning to work with no restrictions.

## 2023-12-08 ENCOUNTER — Telehealth: Payer: Self-pay

## 2023-12-08 NOTE — Telephone Encounter (Signed)
-----   Message from Rollo FABIENE Louder sent at 12/03/2023  3:53 PM EDT ----- Please tell patient that his stress test showed no evidence of ischemia (blockages in the blood vessels around his heart). There was no evidence of infarction (scar/past damage to the heart muscle).  Overall, study was a low risk study.   There were some mild calcifications on the blood vessels around the heart. He should continue his cholesterol medications.   Otherwise, no acute abnormalities/findings on imaging   This is good news! KJ  ----- Message ----- From: Okey Vina GAILS, MD Sent: 12/03/2023   2:16 PM EDT To: Rollo JONELLE Louder, PA-C

## 2023-12-08 NOTE — Telephone Encounter (Signed)
-----   Message from Rollo FABIENE Louder sent at 12/02/2023  4:01 PM EDT ----- Please tell patient that his echocardiogram showed EF (pump strength of the heart) was normal at 60-65%.  There were no regional wall motion abnormalities.  LV diastolic parameters were normal  (meaning the heart relaxes normally).  RV systolic function normal.  No significant valvular abnormalities.  Overall normal echo.  This is good news, no changes to treatment plan ----- Message ----- From: Interface, [employer] Sent: 12/02/2023   3:45 PM EDT To: Rollo JONELLE Louder, PA-C

## 2023-12-08 NOTE — Telephone Encounter (Signed)
 Copied from CRM [id number]. Topic: Clinical - Prescription Issue >> Dec 08, 2023  9:55 AM [name] wrote: Reason for CRM: Patient calling to check status on tirzepatide  (ZEPBOUND ) 2.5 MG/0.5ML Pen. State that pharmacy sent over PA to get med.Would like a call to discuss

## 2023-12-08 NOTE — Telephone Encounter (Signed)
 Left message to call back

## 2023-12-09 ENCOUNTER — Telehealth: Payer: Self-pay | Admitting: Cardiology

## 2023-12-09 ENCOUNTER — Telehealth: Payer: Self-pay

## 2023-12-09 ENCOUNTER — Other Ambulatory Visit (HOSPITAL_COMMUNITY): Payer: Self-pay

## 2023-12-09 NOTE — Telephone Encounter (Signed)
 Pt states nurse called him yesterday and lvm to c/b for his test results. Per pt if he doesn't answer it is ok to leave a detailed message. Please advise.

## 2023-12-09 NOTE — Telephone Encounter (Signed)
 Copied from CRM [id number]. Topic: Clinical - Medication Prior Auth >> Dec 08, 2023  4:58 PM Tiffini S wrote: Reason for CRM: Ana with [employer] for prior authorization [id number] called reference:  [facility] fax number: [phone] for medication tirzepatide  (ZEPBOUND ) 2.5 MG/0.5ML Pen.

## 2023-12-09 NOTE — Telephone Encounter (Signed)
 Spoke with Pt. Gave results for Echo. All questions answered. Advised pt we would call back once other testing was reviewed.

## 2023-12-09 NOTE — Telephone Encounter (Signed)
 Zepbound  not covered, alternatives include:  phentermine 15 MG capsule Diethylpropion HCl 25 MG TABS Phentermine-Topiramate ER 3.75-23 MG CP24

## 2023-12-09 NOTE — Telephone Encounter (Signed)
 Pharmacy Patient Advocate Encounter   Received notification from Pt Calls Messages that prior authorization for Zepboundd 2.5mg /0.80ml is required/requested.   Insurance verification completed.   The patient is insured through [facility] .   Per test claim: PA required; PA submitted to above mentioned insurance via CoverMyMeds Key/confirmation #/EOC [facility] Status is pending

## 2023-12-09 NOTE — Telephone Encounter (Signed)
 Pharmacy Patient Advocate Encounter  Received notification from [employer] that Prior Authorization for Zepbound  2.5mg /0.65ml has been APPROVED from 12/09/23 to 06/10/24   PA #/Case ID/Reference #: [id number]

## 2023-12-11 NOTE — Telephone Encounter (Signed)
 Patient has been made aware.

## 2023-12-15 ENCOUNTER — Encounter (HOSPITAL_COMMUNITY)

## 2023-12-16 MED ORDER — ROSUVASTATIN CALCIUM 40 MG PO TABS: 40.0000 mg | ORAL_TABLET | Freq: Every day | ORAL | 3 refills | Status: AC

## 2023-12-16 NOTE — Telephone Encounter (Signed)
 Called patient advised of below they verbalized understanding.

## 2023-12-17 NOTE — Progress Notes (Signed)
 Cardiology Office Note   Date:  12/31/2023  ID:  [name], DOB 11-Jun-1965, MRN [id number] PCP: Joshua Debby CROME, MD  [facility] Providers Cardiologist:  Redell Shallow, MD     History of Present Illness [name] is a 58 y.o. male with a past medical history of CAD, hypertension, hyperlipidemia.  Patient followed by Dr. Shallow.  Presents today for follow up    Patient previously had cardiac catheterization 2007 that showed normal coronary arteries with distal LAD intramyocardial bridge without obstruction.  Later, echocardiogram in 02/2019 showed normal LV function, trace MR, mild TR.  Coronary CTA in 09/2022 showed 25 to 49% left main, minimal plaque in the LAD, calcified 50 to 69% stenosis in the circumflex; calcium  score 123 which is 91st percentile. FFR suggested obstructive disease in the ostial circumflex.  He underwent left heart catheterization 10/2022 that showed 30% stenosis in distal LAD, 30% stenosis across-proximal circumflex.  Overall had mild nonobstructive CAD.  LVEDP elevated to 26-28 mmHg.  He was started on oral diuretics.  I saw patient on 7/1 for evaluation of chest pain. Chest pain was sharp and crushing. Pain was located across his chest and radiated down his arm. Ordered echocardiogram 7/2 that showed EF 60-65%, no regional wall motion abnormalities, normal diastolic parameters, normal RV systolic function, no significant valvular abnormalities. Stress test 7/3 was a low risk study, no evidence of ischemia or infarction   Today, patient reports that he is to have occasional episodes of chest discomfort.  Chest comfort is predominantly located on the right side of his chest.  Occurs randomly and he cannot identify any triggers.  Pain is not exertional.  Not associated with eating, coughing.  Not pleuritic.  He describes pain as a mix of sharp, pressure, achiness.  Pain is sometimes relieved by nitroglycerin  but not always. He has been having issues with  lower extremity swelling.  When he first started Lasix , noted some improvement but has returned.  He denies dizziness, syncope, near syncope.  We discussed his recent echocardiogram and nuclear stress test, both of which were reassuring.  He sees a vein specialist for varicose veins.  He admits that he is not very active in his daily life.  We discussed increasing physical activity as tolerated.  He has not yet started taking baby aspirin , plans to start later today.  Studies Reviewed  Cardiac Studies & Procedures   ______________________________________________________________________________________________ CARDIAC CATHETERIZATION  CARDIAC CATHETERIZATION 10/17/2022  Conclusion   Dist LAD lesion is 30% stenosed.   Ost Cx to Prox Cx lesion is 30% stenosed.  1.  Mild obstructive coronary artery disease. 2.  LVEDP of 26 to 28 mmHg; the patient will be started on p.o. diuretics.  Recommendation: Medical therapy.  Findings Coronary Findings Diagnostic  Dominance: Right  Left Anterior Descending Dist LAD lesion is 30% stenosed.  Left Circumflex Ost Cx to Prox Cx lesion is 30% stenosed.  Intervention  No interventions have been documented.   STRESS TESTS  MYOCARDIAL PERFUSION IMAGING 12/03/2023  Interpretation Summary   Exercise stress test:  Clinically and electrically negative for ischemia.   Exercise tolerance mildly impaired.   LV perfusion is normal. There is no evidence of ischemia. There is no evidence of infarction.   Coronary calcium  was present on the attenuation correction CT images. Mild coronary calcifications were present. Coronary calcifications were present in the left anterior descending artery distribution(s).   Prior study available for comparison.  No change in perfusion   OVerall  low risk study   ECHOCARDIOGRAM  ECHOCARDIOGRAM COMPLETE 12/02/2023  Narrative ECHOCARDIOGRAM REPORT    Patient Name:   [name] Date of Exam: 12/02/2023 Medical Rec  #:  [id number]          Height:       67.0 in Accession #:    [id number]         Weight:       263.6 lb Date of Birth:  1966-05-21           BSA:          2.274 m Patient Age:    58 years           BP:           124/80 mmHg Patient Gender: M                  HR:           80 bpm. Exam Location:  [address]  Procedure: 2D Echo, Cardiac Doppler, Color Doppler, 3D Echo and Strain Analysis (Both Spectral and Color Flow Doppler were utilized during procedure).  Indications:    Chest Pain R07.9  History:        Patient has prior history of Echocardiogram examinations, most recent 02/25/2019. CAD; Risk Factors:Hypertension.  Sonographer:    Augustin Seals RDCS Referring Phys: [id number] ROLLO JONELLE LOUDER  IMPRESSIONS   1. Left ventricular ejection fraction, by estimation, is 60 to 65%. Left ventricular ejection fraction by 3D volume is 61 %. The left ventricle has normal function. The left ventricle has no regional wall motion abnormalities. Left ventricular diastolic parameters were normal. The average left ventricular global longitudinal strain is 20.8 %. The global longitudinal strain is normal. 2. Right ventricular systolic function is normal. The right ventricular size is normal. 3. The mitral valve is normal in structure. Trivial mitral valve regurgitation. No evidence of mitral stenosis. 4. The aortic valve is normal in structure. Aortic valve regurgitation is not visualized. No aortic stenosis is present. 5. The inferior vena cava is normal in size with greater than 50% respiratory variability, suggesting right atrial pressure of 3 mmHg.  FINDINGS Left Ventricle: Left ventricular ejection fraction, by estimation, is 60 to 65%. Left ventricular ejection fraction by 3D volume is 61 %. The left ventricle has normal function. The left ventricle has no regional wall motion abnormalities. The average left ventricular global longitudinal strain is 20.8 %. Strain was performed and the global  longitudinal strain is normal. The left ventricular internal cavity size was normal in size. There is no left ventricular hypertrophy. Left ventricular diastolic parameters were normal.  Right Ventricle: The right ventricular size is normal. No increase in right ventricular wall thickness. Right ventricular systolic function is normal.  Left Atrium: Left atrial size was normal in size.  Right Atrium: Right atrial size was normal in size.  Pericardium: There is no evidence of pericardial effusion.  Mitral Valve: The mitral valve is normal in structure. Trivial mitral valve regurgitation. No evidence of mitral valve stenosis.  Tricuspid Valve: The tricuspid valve is normal in structure. Tricuspid valve regurgitation is trivial. No evidence of tricuspid stenosis.  Aortic Valve: The aortic valve is normal in structure. Aortic valve regurgitation is not visualized. No aortic stenosis is present.  Pulmonic Valve: The pulmonic valve was normal in structure. Pulmonic valve regurgitation is trivial. No evidence of pulmonic stenosis.  Aorta: The aortic root is normal in size and structure.  Venous: The inferior vena  cava is normal in size with greater than 50% respiratory variability, suggesting right atrial pressure of 3 mmHg.  IAS/Shunts: No atrial level shunt detected by color flow Doppler.  Additional Comments: 3D was performed not requiring image post processing on an independent workstation and was normal.   LEFT VENTRICLE PLAX 2D LVIDd:         4.70 cm         Diastology LVIDs:         3.10 cm         LV e' medial:    8.05 cm/s LV PW:         0.90 cm         LV E/e' medial:  8.9 LV IVS:        1.10 cm         LV e' lateral:   10.40 cm/s LVOT diam:     2.40 cm         LV E/e' lateral: 6.9 LV SV:         96 LV SV Index:   42              2D Longitudinal LVOT Area:     4.52 cm        Strain 2D Strain GLS   -19.3 % (A4C): 2D Strain GLS   -18.9 % (A3C): 2D Strain GLS   -24.1  % (A2C): 2D Strain GLS   20.8 % Avg:  3D Volume EF LV 3D EF:    Left ventricul ar ejection fraction by 3D volume is 61 %.  3D Volume EF: 3D EF:        61 % LV EDV:       117 ml LV ESV:       45 ml LV SV:        72 ml  RIGHT VENTRICLE            IVC RV Basal diam:  3.70 cm    IVC diam: 1.80 cm RV Mid diam:    3.70 cm RV S prime:     9.90 cm/s TAPSE (M-mode): 2.2 cm  LEFT ATRIUM             Index        RIGHT ATRIUM           Index LA diam:        3.90 cm 1.72 cm/m   RA Area:     16.70 cm LA Vol (A2C):   38.1 ml 16.75 ml/m  RA Volume:   46.50 ml  20.45 ml/m LA Vol (A4C):   43.3 ml 19.04 ml/m LA Biplane Vol: 40.8 ml 17.94 ml/m AORTIC VALVE LVOT Vmax:   104.00 cm/s LVOT Vmean:  65.000 cm/s LVOT VTI:    0.212 m  AORTA Ao Root diam: 3.00 cm Ao Asc diam:  3.20 cm  MITRAL VALVE               TRICUSPID VALVE MV Area (PHT): 4.58 cm    TR Peak grad:   24.0 mmHg MV Decel Time: 166 msec    TR Vmax:        245.00 cm/s MV E velocity: 71.60 cm/s MV A velocity: 75.27 cm/s  SHUNTS MV E/A ratio:  0.95        Systemic VTI:  0.21 m Systemic Diam: 2.40 cm  Aditya Sabharwal Electronically signed by Ria Commander Signature Date/Time: 12/02/2023/3:45:38 PM    Final  CT SCANS  CT CORONARY MORPH W/CTA COR W/SCORE 09/29/2022  Addendum 10/02/2022  5:39 PM ADDENDUM REPORT: 10/02/2022 17:36  EXAM: OVER-READ INTERPRETATION  CT CHEST  The following report is an over-read performed by radiologist Dr. Andrea Gasman of [facility] on 10/02/2022. This over-read does not include interpretation of cardiac or coronary anatomy or pathology. The coronary CTA interpretation by the cardiologist is attached.  COMPARISON:  Chest CT 09/04/2020  FINDINGS: Vascular: Mild aortic atherosclerosis. The included aorta is normal in caliber.  Mediastinum/nodes: No adenopathy or mass. Unremarkable esophagus.  Lungs: No focal airspace disease. Pleural based left lower  lobe nodule series 11, image 30, measures 3 mm and is stable from April 2022 exam. No pleural fluid. The included airways are patent.  Upper abdomen: No acute or unexpected findings.  Musculoskeletal: There are no acute or suspicious osseous abnormalities. Thoracic spondylosis with anterior spurring.  IMPRESSION: 1. Subpleural 3 mm left lower lobe nodule is stable from 2022 exam and considered definitively benign. Per Fleischner Society guidelines, no further imaging follow-up is recommended. 2.  Aortic Atherosclerosis (ICD10-I70.0).   Electronically Signed By: Andrea Gasman M.D. On: 10/02/2022 17:36  Narrative CLINICAL DATA:  33M with chest pain  EXAM: Cardiac/Coronary CTA  TECHNIQUE: The patient was scanned on a [employer].  FINDINGS: A 100 kV prospective scan was triggered in the descending thoracic aorta at 111 HU's. Axial non-contrast 3 mm slices were carried out through the heart. The data set was analyzed on a dedicated work station and scored using the Agatson method. Gantry rotation speed was 250 msecs and collimation was .6 mm. No beta blockade and 0.8 mg of sl NTG was given. The 3D data set was reconstructed in 5% intervals of the 35-75% of the R-R cycle. Phases were analyzed on a dedicated work station using MPR, MIP and VRT modes. The patient received 100 cc of contrast.  Coronary Arteries:  Normal coronary origin.  Right dominance.  RCA is a large dominant artery that gives rise to PDA and PLA. There is no plaque.  Left main is a large artery that gives rise to LAD and LCX arteries. Mixed plaque in left main causes 25-49% stenosis. High risk plaque features including positive remodeling, low attenuation plaque, and spotty calcification.  LAD is a large vessel. Noncalcified plaque in proximal LAD causes 0-24% stenosis  LCX is a non-dominant artery that gives rise to one large OM1 branch. Calcified plaque in ostial LCX causes 50-69%  stenosis  Other findings:  Left Ventricle: Normal size  Left Atrium: Mild enlargement  Pulmonary Veins: Normal configuration  Right Ventricle: Normal size  Right Atrium: Normal size  Cardiac valves: No calcifications  Thoracic aorta: Normal size  Pulmonary Arteries: Normal size  Systemic Veins: Normal drainage  Pericardium: Normal thickness  IMPRESSION: 1. Coronary calcium  score of 123. This was 91st percentile for age and sex matched control.  2. Total plaque volume 144mm3 which is 34th percentile for age and sex-matched controls (calcified plaque 80mm3; noncalcified plaque 79mm3). TPV is moderate  3. Normal coronary origin with right dominance.  4. Calcified plaque in ostial LCX causes moderate (50-69%) stenosis  5. Mixed plaque in left main causes mild (25-49%) stenosis. There are high risk plaque features including positive remodeling, low attenuation plaque, and spotty calcification.  6. Noncalcified plaque in proximal LAD causes minimal (0-24%) stenosis  7. Will send study for CTFFR  CAD-RADS 3. Moderate stenosis. Consider symptom-guided anti-ischemic pharmacotherapy as well as risk factor modification per  guideline directed care. Additional analysis with CT FFR will be submitted.  Electronically Signed: By: Lonni Nanas M.D. On: 09/29/2022 16:04     ______________________________________________________________________________________________       Risk Assessment/Calculations           Physical Exam VS:  BP 135/86   Pulse 76   Ht 5' 7 (1.702 m)   Wt 262 lb (118.8 kg)   SpO2 94%   BMI 41.04 kg/m        Wt Readings from Last 3 Encounters:  12/31/23 262 lb (118.8 kg)  12/03/23 263 lb (119.3 kg)  12/03/23 260 lb 12.8 oz (118.3 kg)    GEN: Well nourished, well developed in no acute distress. Sitting comfortably in the chair  NECK: No JVD  CARDIAC:  RRR, no murmurs, rubs, gallops. Radial pulses 2+ bilaterally  RESPIRATORY:   Clear to auscultation without rales, wheezing or rhonchi. Normal WOB on room air   ABDOMEN: Soft, non-tender, non-distended EXTREMITIES: 1+ edema in BLE; No deformity   ASSESSMENT AND PLAN  Chest pain CAD - Heart catheterization in 10/2022 showed 30% distal LAD, 30% Ost-prox circumflex - Patient seen in 12/2023 for chest pain. Echocardiogram showed EF 60-65%, no wall motion abnormalities, normal diastolic parameters, normal RV systolic function. Stress test was a low risk study without evidence of ischemia or infarction  - Today patient reports that he is continue to have chest pain.  Chest pain occurs randomly and is not associated with exertion.  It is predominantly on the right side of his chest.  Does not occur after eating.  Not pleuritic or positional.  Overall atypical for cardiac cause.  Discussed that his echo and stress test were very reassuring. -Patient admits that he has not been very active in his daily life.  Encouraged him to increase physical activity as tolerated - Continue aspirin  81 mg daily - Continue Crestor  40 mg daily   Hyperlipidemia - LDL 63 in 03/2023 - Continue crestor  40 mg daily    Chronic diastolic CHF Lower extremity swelling   - Recently started back on lasix  20 mg daily.  Initially noted some improvement in swelling but swelling has returned.  Denies shortness of breath -Patient has 1+ edema in bilateral lower extremities on exam today. -Instructed patient to increase Lasix  to 40 mg daily for the next 3 days.  Then, resume Lasix  20 mg daily.  Discussed that he could take an extra 20 mg as needed for worsening swelling - Creatinine 0.87 and potassium 4.6 on 10/06/23.  Ordered repeat BMP today prior to increasing Lasix  dose -Of note, patient also sees a vascular specialist for varicose veins.  Suspect venous insufficiency is contributing to his lower extremity swelling   Dispo: Follow-up in 6 months with Dr. Pietro  Signed, Rollo FABIENE Louder, PA-C

## 2023-12-31 ENCOUNTER — Encounter: Payer: Self-pay | Admitting: Cardiology

## 2023-12-31 ENCOUNTER — Ambulatory Visit (INDEPENDENT_AMBULATORY_CARE_PROVIDER_SITE_OTHER)

## 2023-12-31 ENCOUNTER — Encounter: Payer: Self-pay | Admitting: Podiatry

## 2023-12-31 ENCOUNTER — Ambulatory Visit (INDEPENDENT_AMBULATORY_CARE_PROVIDER_SITE_OTHER): Admitting: Podiatry

## 2023-12-31 ENCOUNTER — Ambulatory Visit: Attending: Cardiology | Admitting: Cardiology

## 2023-12-31 VITALS — BP 135/86 | HR 76 | Ht 67.0 in | Wt 262.0 lb

## 2023-12-31 DIAGNOSIS — M2012 Hallux valgus (acquired), left foot: Secondary | ICD-10-CM | POA: Diagnosis not present

## 2023-12-31 DIAGNOSIS — R079 Chest pain, unspecified: Secondary | ICD-10-CM | POA: Diagnosis not present

## 2023-12-31 DIAGNOSIS — M722 Plantar fascial fibromatosis: Secondary | ICD-10-CM | POA: Diagnosis not present

## 2023-12-31 DIAGNOSIS — I5032 Chronic diastolic (congestive) heart failure: Secondary | ICD-10-CM | POA: Diagnosis not present

## 2023-12-31 DIAGNOSIS — E785 Hyperlipidemia, unspecified: Secondary | ICD-10-CM | POA: Diagnosis not present

## 2023-12-31 DIAGNOSIS — I2510 Atherosclerotic heart disease of native coronary artery without angina pectoris: Secondary | ICD-10-CM | POA: Diagnosis not present

## 2023-12-31 DIAGNOSIS — M65979 Unspecified synovitis and tenosynovitis, unspecified ankle and foot: Secondary | ICD-10-CM | POA: Diagnosis not present

## 2023-12-31 DIAGNOSIS — I872 Venous insufficiency (chronic) (peripheral): Secondary | ICD-10-CM

## 2023-12-31 MED ORDER — TRIAMCINOLONE ACETONIDE 40 MG/ML IJ SUSP
20.0000 mg | Freq: Once | INTRAMUSCULAR | Status: AC
  Administered 2023-12-31: 20 mg

## 2023-12-31 MED ORDER — FUROSEMIDE 20 MG PO TABS: 20.0000 mg | ORAL_TABLET | Freq: Every day | ORAL | 3 refills | Status: AC

## 2023-12-31 MED ORDER — METHYLPREDNISOLONE 4 MG PO TBPK
ORAL_TABLET | ORAL | 0 refills | Status: DC
Start: 2023-12-31 — End: 2024-01-20

## 2023-12-31 NOTE — Progress Notes (Signed)
 Subjective:  Patient ID: [name], male    DOB: May 11, 1966,  MRN: [id number] HPI Chief Complaint  Patient presents with   Foot Pain    I have pain in the bottom of my right foot.  My wife said I have a bunion. N - bottom foot pain L - heel pain plantar right D - 3 mos O - suddenly, getting worse  C - sharp pain A - walking, standing T - none  N - bunion L - left  D - 3 mos O - suddenly, off and on C - swell, sharp pain, tender A - touch T - none    58 y.o. male presents with the above complaint.   ROS: Denies fever chills nausea mobic  muscle aches pains calf pain back pain chest pain shortness of breath.  Past Medical History:  Diagnosis Date   Anxiety    Arthritis    knees - no med   Cluster headache    Hx - resolved per patient   Coronary artery disease    30% dLAD, 30% pLCX 10/17/22   Depression    DVT of lower extremity (deep venous thrombosis) (HCC) 6/26-28/2013   Xarelto  - resolved - history   History of kidney stones    HTN (hypertension)    currently no meds   Irritability and anger    PMH of   Kidney stone    passed stone, no surgery required.   Mild intermittent asthma without complication 11/13/2020   Rotator cuff arthropathy 2007   right   Saddle pulmonary embolus (HCC) 6/26-28/2013   post orthopedic surgery - resolved   Sleep apnea 09/2013   moderate OSA-has cpap but does not use it   Past Surgical History:  Procedure Laterality Date   arthroscopic knee surgery  10/09/2011   Dr Vernetta   CARDIAC CATHETERIZATION  06/02/2005   LVdysfunction; normal coronaries   COLONOSCOPY     EXTRACORPOREAL SHOCK WAVE LITHOTRIPSY Left 09/05/2022   Procedure: EXTRACORPOREAL SHOCK WAVE LITHOTRIPSY (ESWL);  Surgeon: Nieves Cough, MD;  Location: [facility];  Service: Urology;  Laterality: Left;   EXTRACORPOREAL SHOCK WAVE LITHOTRIPSY Left 11/21/2022   Procedure: EXTRACORPOREAL SHOCK WAVE LITHOTRIPSY (ESWL);  Surgeon: Nieves Cough, MD;  Location[facility];  Service: Urology;  Laterality: Left;   KNEE ARTHROSCOPY WITH MEDIAL MENISECTOMY Left 09/20/2014   medial menisectomy chondroplastytella medial plica excision  ;  Surgeon: Norleen Gavel, MD;  Location: [facility];  Service: Orthopedics;  Laterality: Left;   LEFT HEART CATH AND CORONARY ANGIOGRAPHY N/A 10/17/2022   Procedure: LEFT HEART CATH AND CORONARY ANGIOGRAPHY;  Surgeon: Wendel Lurena POUR, MD;  Location: [facility];  Service: Cardiovascular;  Laterality: N/A;   ROTATOR CUFF REPAIR  06/02/2005   right   UMBILICAL HERNIA REPAIR N/A 10/06/2023   Procedure: REPAIR, HERNIA, UMBILICAL, ADULT;  Surgeon: Vanderbilt Ned, MD;  Location: [facility] OR;  Service: General;  Laterality: N/A;  WITH MESH   UPPER GASTROINTESTINAL ENDOSCOPY  06/02/2005   normal   WISDOM TOOTH EXTRACTION      Current Outpatient Medications:    ALPRAZolam  (XANAX ) 0.5 MG tablet, Take 1 tablet (0.5 mg total) by mouth 2 (two) times daily as needed for anxiety., Disp: 180 tablet, Rfl: 0   budesonide -formoterol  (SYMBICORT ) 160-4.5 MCG/ACT inhaler, Inhale 2 puffs into the lungs in the morning and at bedtime., Disp: 1 each, Rfl: 6   Cholecalciferol  (VITAMIN D3) 50 MCG (2000 UT) capsule, Take 2,000  Units by mouth daily., Disp: , Rfl:    fluticasone  (FLONASE ) 50 MCG/ACT nasal spray, SPRAY 2 SPRAYS INTO EACH NOSTRIL EVERY DAY, Disp: 16 mL, Rfl: 2   furosemide  (LASIX ) 20 MG tablet, Take 1 tablet (20 mg total) by mouth daily. You can take an additional dose of Lasix  as needed for SOB, Lower extremity edema or a weight gain of 2-3 lbs overnight or 5 lbs in a week., Disp: 90 tablet, Rfl: 3   ibuprofen  (ADVIL ) 800 MG tablet, Take 1 tablet (800 mg total) by mouth 2 (two) times daily as needed., Disp: 60 tablet, Rfl: 3   methylPREDNISolone  (MEDROL  DOSEPAK) 4 MG TBPK tablet, 6 day dose pack - take as directed, Disp: 21 tablet, Rfl: 0   modafinil  (PROVIGIL ) 100 MG tablet, Take 2  tablets (200 mg total) by mouth daily., Disp: 60 tablet, Rfl: 1   Multiple Vitamin (MULTIVITAMIN WITH MINERALS) TABS tablet, Take 1 tablet by mouth daily., Disp: , Rfl:    nitroGLYCERIN  (NITROSTAT ) 0.3 MG SL tablet, PLACE 1 TABLET (0.3 MG TOTAL) UNDER THE TONGUE 2 (TWO) TIMES DAILY AS NEEDED., Disp: 25 tablet, Rfl: 2   omeprazole  (PRILOSEC) 40 MG capsule, TAKE 1 CAPSULE (40 MG TOTAL) BY MOUTH DAILY., Disp: 90 capsule, Rfl: 1   rosuvastatin  (CRESTOR ) 40 MG tablet, Take 1 tablet (40 mg total) by mouth daily., Disp: 90 tablet, Rfl: 3   tadalafil  (CIALIS ) 5 MG tablet, Take 1 tablet (5 mg total) by mouth daily., Disp: 90 tablet, Rfl: 1   tirzepatide  (ZEPBOUND ) 2.5 MG/0.5ML Pen, Inject 2.5 mg into the skin once a week., Disp: 2 mL, Rfl: 0   traMADol  (ULTRAM ) 50 MG tablet, Take 1 tablet (50 mg total) by mouth every 12 (twelve) hours as needed., Disp: 180 tablet, Rfl: 1  No Known Allergies Review of Systems Objective:  There were no vitals filed for this visit.  General: Well developed, nourished, in no acute distress, alert and oriented x3   Dermatological: Skin is warm, dry and supple bilateral. Nails x 10 are well maintained; remaining integument appears unremarkable at this time. There are no open sores, no preulcerative lesions, no rash or signs of infection present.  Vascular: Dorsalis Pedis artery and Posterior Tibial artery pedal pulses are 2/4 bilateral with immedate capillary fill time. Pedal hair growth present. No varicosities and no lower extremity edema present bilateral.   Neruologic: Grossly intact via light touch bilateral. Vibratory intact via tuning fork bilateral. Protective threshold with Semmes Wienstein monofilament intact to all pedal sites bilateral. Patellar and Achilles deep tendon reflexes 2+ bilateral. No Babinski or clonus noted bilateral.   Musculoskeletal: No gross boney pedal deformities bilateral. No pain, crepitus, or limitation noted with foot and ankle range of  motion bilateral. Muscular strength 5/5 in all groups tested bilateral.  Mild hallux valgus of the left foot does demonstrate a mild neuritis and bursitis.  Majority of his tenderness however is on the medial band of the plantar fascia at the plantar fascial canny insertion site of the right heel.   Gait: Unassisted, Nonantalgic.    Radiographs:  Radiographs taken today demonstrate osseously mature individual mild hallux valgus deformity bilateral hypertrophic medial condyle to the first metatarsal head of the left foot is more prominent than that of the right.  Soft tissue swelling overlying this is also noted.  Contralateral foot demonstrates similar findings but primary finding is a small plantar distally oriented calcaneal spur with density at the plantar fascial calcaneal insertion site indicative of plantar  fasciitis.  Assessment & Plan:   Assessment: Plantar fasciitis right bursitis capsulitis osteoarthritis hallux valgus deformity left foot  Plan: Discussed etiology pathology conservative versus surgical therapies.  At this point I injected his right heel 20 mg Kenalog  5 mg Marcaine  primary cutaneous.  Tolerated procedure well without complications.  I also injected the bursa and capsulitis at the hallux valgus deformity of the left foot today with 5 mg of Marcaine  and 2 mg dexamethasone .  Discussed appropriate shoe gear stretching exercise ice therapy shoewear modifications.  And also started him on methylprednisolone .  I like to follow-up with him in 1 month.     Ireoluwa Gorsline T. [place], [place]

## 2023-12-31 NOTE — Patient Instructions (Signed)
 Medication Instructions:  Start aspirin  81 mg once a day  Take Lasix  40 mg for the next 3 days then  go down to 20 mg once a day with an additional 20 mg as needed for Lower extremity edema, SOB, or a weight gain of 2-3 lbs over night or 5 lbs in a week *If you need a refill on your cardiac medications before your next appointment, please call your pharmacy*  Lab Work: Today we are going to draw a Bmet If you have labs (blood work) drawn today and your tests are completely normal, you will receive your results only by: MyChart Message (if you have MyChart) OR A paper copy in the mail If you have any lab test that is abnormal or we need to change your treatment, we will call you to review the results.  Testing/Procedures: No testing  Follow-Up: At [facility], you and your health needs are our priority.  As part of our continuing mission to provide you with exceptional heart care, our providers are all part of one team.  This team includes your primary Cardiologist (physician) and Advanced Practice Providers or APPs (Physician Assistants and Nurse Practitioners) who all work together to provide you with the care you need, when you need it.  Your next appointment:   6 month(s)  Provider:   Redell Shallow, MD    We recommend signing up for the patient portal called MyChart.  Sign up information is provided on this After Visit Summary.  MyChart is used to connect with patients for Virtual Visits (Telemedicine).  Patients are able to view lab/test results, encounter notes, upcoming appointments, etc.  Non-urgent messages can be sent to your provider as well.   To learn more about what you can do with MyChart, go to [email or web address].

## 2024-01-01 ENCOUNTER — Ambulatory Visit: Payer: Self-pay | Admitting: Cardiology

## 2024-01-01 LAB — BASIC METABOLIC PANEL WITH GFR
BUN/Creatinine Ratio: 14 (ref 9–20)
BUN: 13 mg/dL (ref 6–24)
CO2: 22 mmol/L (ref 20–29)
Calcium: 9.2 mg/dL (ref 8.7–10.2)
Chloride: 103 mmol/L (ref 96–106)
Creatinine, Ser: 0.91 mg/dL (ref 0.76–1.27)
Glucose: 77 mg/dL (ref 70–99)
Potassium: 4.3 mmol/L (ref 3.5–5.2)
Sodium: 142 mmol/L (ref 134–144)
eGFR: 98 mL/min/1.73 (ref >59)

## 2024-01-02 ENCOUNTER — Other Ambulatory Visit: Payer: Self-pay | Admitting: Internal Medicine

## 2024-01-02 DIAGNOSIS — G4733 Obstructive sleep apnea (adult) (pediatric): Secondary | ICD-10-CM

## 2024-01-04 ENCOUNTER — Other Ambulatory Visit: Payer: Self-pay | Admitting: Internal Medicine

## 2024-01-04 DIAGNOSIS — G4733 Obstructive sleep apnea (adult) (pediatric): Secondary | ICD-10-CM

## 2024-01-04 NOTE — Telephone Encounter (Signed)
 Copied from CRM [id number]. Topic: Clinical - Medication Refill >> Jan 04, 2024  4:43 PM Abigail D wrote: Medication: ZEPBOUND  2.5 MG/0.5ML Pen [Pharmacy Med Name: ZEPBOUND  2.5 MG/0.5 ML PEN] [[id number]]  Has the patient contacted their pharmacy? Yes (Agent: If no, request that the patient contact the pharmacy for the refill. If patient does not wish to contact the pharmacy document the reason why and proceed with request.) (Agent: If yes, when and what did the pharmacy advise?)  This is the patient's preferred pharmacy:  [id number] - [place],  - [address] AT [facility] [address] [place] [address] Phone: [phone] Fax: [phone]  Is this the correct pharmacy for this prescription? Yes If no, delete pharmacy and type the correct one.   Has the prescription been filled recently? No  Is the patient out of the medication? Yes  Has the patient been seen for an appointment in the last year OR does the patient have an upcoming appointment? Yes  Can we respond through MyChart? Yes  Agent: Please be advised that Rx refills may take up to 3 business days. We ask that you follow-up with your pharmacy.

## 2024-01-05 ENCOUNTER — Telehealth: Payer: Self-pay | Admitting: Nurse Practitioner

## 2024-01-05 NOTE — Telephone Encounter (Signed)
 LVMTCB to schedule PFT from the PFT list. Needs to be scheduled in the [place] office with Izetta Rouleau NP.

## 2024-01-05 NOTE — Telephone Encounter (Signed)
 Last OV 12/03/23 Next OV 06/06/24  Last refill 12/03/23 Qty #2 mL / 0

## 2024-01-06 ENCOUNTER — Other Ambulatory Visit: Payer: Self-pay | Admitting: Nurse Practitioner

## 2024-01-06 DIAGNOSIS — K219 Gastro-esophageal reflux disease without esophagitis: Secondary | ICD-10-CM

## 2024-01-06 NOTE — Procedures (Signed)
Mask fit

## 2024-01-13 NOTE — Progress Notes (Unsigned)
 [name] [address] [place] [address] Phone: [phone] Subjective:   [name] Berwyn Posey, am serving as a scribe for Dr. Arthea Claudene.  I'm seeing this patient by the request  of:  Joshua Debby CROME, MD  CC: Bilateral knee pain  YEP:Dlagzrupcz  11/13/2023 Arthritic changes noted.  Discussed icing regimen of home exercises, which activities to do and which ones to avoid.  Patient still needs to get BMI under 40 to consider surgical intervention.  Patient will consider this but still months out from when patient would be able to surgery.  Continues to try to stay active.  Follow-up with me again in 2 to 3 months     Updated 01/22/2024 RMANI KELLOGG is a 58 y.o. male coming in with complaint of knee pain, with known severe arthritis.  Patient has been attempting to lose weight to allow him to have potential surgical intervention.  Patient states that both knees are painful. L>R.        Past Medical History:  Diagnosis Date   Anxiety    Arthritis    knees - no med   Cluster headache    Hx - resolved per patient   Coronary artery disease    30% dLAD, 30% pLCX 10/17/22   Depression    DVT of lower extremity (deep venous thrombosis) (HCC) 6/26-28/2013   Xarelto  - resolved - history   History of kidney stones    HTN (hypertension)    currently no meds   Irritability and anger    PMH of   Kidney stone    passed stone, no surgery required.   Mild intermittent asthma without complication 11/13/2020   Rotator cuff arthropathy 2007   right   Saddle pulmonary embolus (HCC) 6/26-28/2013   post orthopedic surgery - resolved   Sleep apnea 09/2013   moderate OSA-has cpap but does not use it   Past Surgical History:  Procedure Laterality Date   arthroscopic knee surgery  10/09/2011   Dr Vernetta   CARDIAC CATHETERIZATION  06/02/2005   LVdysfunction; normal coronaries   COLONOSCOPY     EXTRACORPOREAL SHOCK WAVE LITHOTRIPSY Left 09/05/2022    Procedure: EXTRACORPOREAL SHOCK WAVE LITHOTRIPSY (ESWL);  Surgeon: Nieves Cough, MD;  Location: [facility];  Service: Urology;  Laterality: Left;   EXTRACORPOREAL SHOCK WAVE LITHOTRIPSY Left 11/21/2022   Procedure: EXTRACORPOREAL SHOCK WAVE LITHOTRIPSY (ESWL);  Surgeon: Nieves Cough, MD;  Location: [facility];  Service: Urology;  Laterality: Left;   KNEE ARTHROSCOPY WITH MEDIAL MENISECTOMY Left 09/20/2014   medial menisectomy chondroplastytella medial plica excision  ;  Surgeon: Norleen Gavel, MD;  Location: [facility];  Service: Orthopedics;  Laterality: Left;   LEFT HEART CATH AND CORONARY ANGIOGRAPHY N/A 10/17/2022   Procedure: LEFT HEART CATH AND CORONARY ANGIOGRAPHY;  Surgeon: Wendel Lurena POUR, MD;  Location: [facility];  Service: Cardiovascular;  Laterality: N/A;   ROTATOR CUFF REPAIR  06/02/2005   right   UMBILICAL HERNIA REPAIR N/A 10/06/2023   Procedure: REPAIR, HERNIA, UMBILICAL, ADULT;  Surgeon: Vanderbilt Debby, MD;  Location: [facility];  Service: General;  Laterality: N/A;  WITH MESH   UPPER GASTROINTESTINAL ENDOSCOPY  06/02/2005   normal   WISDOM TOOTH EXTRACTION     Social History   Socioeconomic History   Marital status: Married    Spouse name: Not on file   Number of children: 6   Years of education: 11   Highest  education level: Not on file  Occupational History   Occupation: Location manager    Occupation: Lobbyist  Tobacco Use   Smoking status: Former    Types: Cigars    Quit date: 11/24/2023    Years since quitting: 0.1    Passive exposure: Never   Smokeless tobacco: Never   Tobacco comments:    every 3-4 months  Vaping Use   Vaping status: Never Used  Substance and Sexual Activity   Alcohol use: Not Currently   Drug use: No   Sexual activity: Yes    Partners: Female    Birth control/protection: Condom  Other Topics Concern   Not on file  Social History Narrative   Fun: Exercise    Social Drivers of Health   Financial Resource Strain: Not on file  Food Insecurity: Not on file  Transportation Needs: Not on file  Physical Activity: Not on file  Stress: Not on file  Social Connections: Unknown (10/11/2021)   Received from [employer]   Social Network    Social Network: Not on file   No Known Allergies Family History  Problem Relation Age of Onset   Cancer Mother        Mesothelioma    Diabetes Brother    Hypertension Brother    Stroke Brother 50   Heart attack Brother 55   Diabetes Maternal Aunt    Diabetes Maternal Grandmother    Heart disease Maternal Grandmother    Hypertension Maternal Grandmother    Stroke Maternal Grandmother        in 75s   Deep vein thrombosis Maternal Grandmother    Stroke Maternal Grandfather        > 55   Migraines Brother    Heart attack Brother 42   Healthy Father    Colon cancer Neg Hx    Esophageal cancer Neg Hx    Rectal cancer Neg Hx    Stomach cancer Neg Hx    Liver cancer Neg Hx    Pancreatic cancer Neg Hx    Other Neg Hx        low testosterone      Current Outpatient Medications (Cardiovascular):    furosemide  (LASIX ) 20 MG tablet, Take 1 tablet (20 mg total) by mouth daily. You can take an additional dose of Lasix  as needed for SOB, Lower extremity edema or a weight gain of 2-3 lbs overnight or 5 lbs in a week.   nitroGLYCERIN  (NITROSTAT ) 0.3 MG SL tablet, PLACE 1 TABLET (0.3 MG TOTAL) UNDER THE TONGUE 2 (TWO) TIMES DAILY AS NEEDED.   rosuvastatin  (CRESTOR ) 40 MG tablet, Take 1 tablet (40 mg total) by mouth daily.   tadalafil  (CIALIS ) 5 MG tablet, Take 1 tablet (5 mg total) by mouth daily.  Current Outpatient Medications (Respiratory):    fluticasone  (FLONASE ) 50 MCG/ACT nasal spray, SPRAY 2 SPRAYS INTO EACH NOSTRIL EVERY DAY (Patient taking differently: as needed.)  Current Outpatient Medications (Analgesics):    ibuprofen  (ADVIL ) 800 MG tablet, Take 1 tablet (800 mg total) by mouth 2 (two) times  daily as needed.   traMADol  (ULTRAM ) 50 MG tablet, Take 1 tablet (50 mg total) by mouth every 12 (twelve) hours as needed.   Current Outpatient Medications (Other):    ALPRAZolam  (XANAX ) 0.5 MG tablet, Take 1 tablet (0.5 mg total) by mouth 2 (two) times daily as needed for anxiety.   Cholecalciferol  (VITAMIN D3) 50 MCG (2000 UT) capsule, Take 2,000 Units by mouth daily.   modafinil  (  PROVIGIL ) 200 MG tablet, Take 1 tablet (200 mg total) by mouth daily.   Multiple Vitamin (MULTIVITAMIN WITH MINERALS) TABS tablet, Take 1 tablet by mouth daily.   omeprazole  (PRILOSEC) 40 MG capsule, TAKE 1 CAPSULE (40 MG TOTAL) BY MOUTH DAILY.   tirzepatide  (ZEPBOUND ) 2.5 MG/0.5ML Pen, INJECT 2.5 MG SUBCUTANEOUSLY WEEKLY   Reviewed prior external information including notes and imaging from  primary care provider As well as notes that were available from care everywhere and other healthcare systems.  Past medical history, social, surgical and family history all reviewed in electronic medical record.  No pertanent information unless stated regarding to the chief complaint.   Review of Systems:  No headache, visual changes, nausea, vomiting, diarrhea, constipation, dizziness, abdominal pain, skin rash, fevers, chills, night sweats, weight loss, swollen lymph nodes, body aches, joint swelling, chest pain, shortness of breath, mood changes. POSITIVE muscle aches  Objective  Pulse (!) 111, height 5' 7 (1.702 m), weight 256 lb (116.1 kg), SpO2 93%.   General: No apparent distress alert and oriented x3 mood and affect normal, dressed appropriately.  HEENT: Pupils equal, extraocular movements intact  Respiratory: Patient's speak in full sentences and does not appear short of breath  Cardiovascular: No lower extremity edema, non tender, no erythema  Bilateral knees do have effusion noted.  Limited range of motion secondary to the arthritic changes noted.   After informed written and verbal consent, patient was  seated on exam table. Right knee was prepped with alcohol swab and utilizing anterolateral approach, patient's right knee space was injected with 4:1  marcaine  0.5%: Kenalog  40mg /dL. Patient tolerated the procedure well without immediate complications.  After informed written and verbal consent, patient was seated on exam table. Left knee was prepped with alcohol swab and utilizing anterolateral approach, patient's left knee space was injected with 4:1  marcaine  0.5%: Kenalog  40mg /dL. Patient tolerated the procedure well without immediate complications.   Impression and Recommendations:    The above documentation has been reviewed and is accurate and complete Perri Lamagna M Laurenashley Viar, DO

## 2024-01-20 ENCOUNTER — Encounter: Payer: Self-pay | Admitting: Nurse Practitioner

## 2024-01-20 ENCOUNTER — Ambulatory Visit: Admitting: Pulmonary Disease

## 2024-01-20 ENCOUNTER — Telehealth: Admitting: Nurse Practitioner

## 2024-01-20 ENCOUNTER — Ambulatory Visit: Admitting: Nurse Practitioner

## 2024-01-20 DIAGNOSIS — G4733 Obstructive sleep apnea (adult) (pediatric): Secondary | ICD-10-CM

## 2024-01-20 DIAGNOSIS — G4730 Sleep apnea, unspecified: Secondary | ICD-10-CM

## 2024-01-20 DIAGNOSIS — J4530 Mild persistent asthma, uncomplicated: Secondary | ICD-10-CM | POA: Diagnosis not present

## 2024-01-20 DIAGNOSIS — R400 Somnolence: Secondary | ICD-10-CM

## 2024-01-20 DIAGNOSIS — Z87891 Personal history of nicotine dependence: Secondary | ICD-10-CM

## 2024-01-20 DIAGNOSIS — J329 Chronic sinusitis, unspecified: Secondary | ICD-10-CM | POA: Diagnosis not present

## 2024-01-20 DIAGNOSIS — R053 Chronic cough: Secondary | ICD-10-CM

## 2024-01-20 DIAGNOSIS — R0609 Other forms of dyspnea: Secondary | ICD-10-CM

## 2024-01-20 DIAGNOSIS — J4520 Mild intermittent asthma, uncomplicated: Secondary | ICD-10-CM

## 2024-01-20 DIAGNOSIS — G4710 Hypersomnia, unspecified: Secondary | ICD-10-CM

## 2024-01-20 LAB — PULMONARY FUNCTION TEST
DL/VA % pred: 101 %
DL/VA: 4.4 ml/min/mmHg/L
DLCO cor % pred: 96 %
DLCO cor: 24.58 ml/min/mmHg
DLCO unc % pred: 96 %
DLCO unc: 24.58 ml/min/mmHg
FEF 25-75 Post: 4.66 L/s
FEF 25-75 Pre: 5.33 L/s
FEF2575-%Change-Post: -12 %
FEF2575-%Pred-Post: 166 %
FEF2575-%Pred-Pre: 190 %
FEV1-%Change-Post: -9 %
FEV1-%Pred-Post: 100 %
FEV1-%Pred-Pre: 110 %
FEV1-Post: 3.31 L
FEV1-Pre: 3.65 L
FEV1FVC-%Change-Post: -2 %
FEV1FVC-%Pred-Pre: 120 %
FEV6-%Change-Post: -7 %
FEV6-%Pred-Post: 89 %
FEV6-%Pred-Pre: 96 %
FEV6-Post: 3.69 L
FEV6-Pre: 3.98 L
FEV6FVC-%Change-Post: 0 %
FEV6FVC-%Pred-Post: 104 %
FEV6FVC-%Pred-Pre: 104 %
FVC-%Change-Post: -7 %
FVC-%Pred-Post: 85 %
FVC-%Pred-Pre: 91 %
FVC-Post: 3.7 L
FVC-Pre: 3.98 L
Post FEV1/FVC ratio: 90 %
Post FEV6/FVC ratio: 100 %
Pre FEV1/FVC ratio: 92 %
Pre FEV6/FVC Ratio: 100 %
RV % pred: 59 %
RV: 1.22 L
TLC % pred: 82 %
TLC: 5.27 L

## 2024-01-20 MED ORDER — MODAFINIL 200 MG PO TABS
200.0000 mg | ORAL_TABLET | Freq: Every day | ORAL | 5 refills | Status: AC
Start: 2024-01-20 — End: ?

## 2024-01-20 NOTE — Progress Notes (Signed)
 Patient ID: [name], male     DOB: 1965/11/06, 58 y.o.      MRN: [id number]  Chief Complaint  Patient presents with   Shortness of Breath    SOB. No wheezing. Hard to breath out of one of his nostrils. Occasional cough.    Virtual Visit via Video Note  I connected with [name] on 01/21/24 at  4:00 PM EDT by a video enabled telemedicine application and verified that I am speaking with the correct person using two identifiers.  Location: Patient: Home Provider: Office   I discussed the limitations of evaluation and management by telemedicine and the availability of in person appointments. The patient expressed understanding and agreed to proceed.  History of Present Illness: 58 year old male, former smoker followed for OSA on CPAP and mild asthma. He is a patient of Dr. Cyndi and last seen in office 08/14/2023 by [facility] NP. Past medical history significant for HTN, RBBB, ED, angina, GERD, PUD, obesity, CHF, hx of saddle PE.    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 Normal. ERV reduced consistent with restriction from body habitus.    12/31/2021: OV with Dr. Jude. Moderate OSA on CPAP. Does not feel fully rested and doesn't have as much energy. Wonders if CPAP needs to be adjusted. Doesn't always feel like he's getting enough air. Wife has not witnessed any apneas or snoring. Excellent compliance and good control on set pressure 12 cmH2O; will adjust him to auto 12-15 cmH2O and trial AirFit F30 fullface mask. His PCP had given him Trelegy to try but didn't feel like this helped. Felt as thought he didn't need this. Had him resume PRN SABA. Advised him to start asteline for allergic rhinitis and OTC nasal decongestant as needed.    01/01/2023: OV with Ty Buntrock NP for follow up. He has been doing okay since he was here  last. He was having difficulties with fatigue, SOB, diaphoresis, and chest discomfort that was evaluated by cardiology. He had LHC with mild CAD, without need for intervention. He was found to have LVEDP of 26-28 and therefore started on diuretics. He takes 20 mg 1-2 times daily depending on symptoms. He feels like this has helped with his symptoms. Breathing feels better. Only has to use albuterol  during allergy season. No cough, chest congestion, wheezing.  Regarding his CPAP, he thinks he is due for a new machine. He's been having to tape his water chamber in so he would like to get a new one if able. He does feel like he's better rested, most nights, with the change to full face mask and auto setting. He still has some days that he wakes up and feels more tired than others. He was also having some issues with mask leaking but changed his mask last night and slept better so he thinks this fixed the problem. No drowsy driving or morning headaches.    12/02/2022-12/31/2022: CPAP 12-15 cmH2O 28/30 days; 93% >4 hr; av use 7 hr 40 min Pressure 95th 13 Leaks 95th 17 AHI 1.6   04/16/2023: Ov with Torian Quintero NP for follow up. Doing well on new CPAP for the most part. He is having some leaks and trouble with his mask. Feels better rested. Does have some fatigue after work. Sleeps well at night aside from the leaks. Denies any  drowsy driving or sleep parasomnias/paralysis.  Download reviewed on phone 93% >4 hr; average use 5 hr 46 min Leaks 95th 31 AHI 1.43   07/20/2023: OV with Cerra Eisenhower NP for follow up. We sent orders to adjust his CPAP settings at his last visit but the DME company did not update this. He feels like he's doing okay for right now. Not noticing as many leaks. Sleeping with it every night. Does still have some daytime fatigue but he is only averaging 6 hours of sleep a night, sometimes less. Denies any drowsy driving or sleep parasomnias/paralysis. No issues with sleep onset. Does have some difficulties  with maintenance due to his breathing.  Feels like his asthma has been difficult to manage recently. Some cough at night; has more trouble with his breathing at night but does get some chest tightness and wheezing during the day. Phlegm can be green sometimes. This has been ongoing for a few months. Denies any fevers, chills, hemoptysis, night sweats. Does have some sinus congestion and drainage. He does have seasonal allergies. He's not using any nasal sprays. He is using his airsupra  twice a day, which does help.  06/20/2023-07/19/2023: CPAP 12-15 cmH2O 28/30 days; 90% >4 hr; average use 5 hr 57 min Pressure 95th 14 Leaks 95th 18.9 AHI 1.5   ACT 13 FeNO 5 ppb  08/14/2023: Ov with Yitta Gongaware NP Unclear if truly asthma driven vs chronic sinus disease. Symbicort  has reduced rescue use, indicating some benefit. No acute worsening today to indicate exacerbation. Prior FeNO nl. He does have peripheral eosinophilia and mild elevation in IgE. Positive RAST to mold. Silent reflux possible contributing factor to cough/ongoing respiratory symptoms. Target GERD with PPI therapy and reassess response. Referral to ENT to rule out chronic sinusitis. Can consider further allergy workup or potentially biologic therapy if symptoms remain poorly managed. Action plan in place.  Nasal Congestion Persistent sinusitis/rhinitis symptoms affecting breathing. Flonase  not providing significant relief. Discussed use of Afrin for severe congestion at night - educated to not use longer than 3-5 days consecutively avoid rebound symptoms. ENT referral.  GERD No overt symptoms. Will trial him on PPI to see if this helps with cough/PND. Side effect profile reviewed Obstructive Sleep Apnea (OSA) OSA well-controlled with CPAP. Aware of risks of untreated OSA. Understands proper use/care of device. Healthy weight loss encourage. Experiences daytime fatigue despite consistent CPAP use. Discussed trial of modafinil  for persistent daytime  hypersomia. Informed of potential side effects. Educated on proper use.   01/20/2024: Today - follow up Discussed the use of AI scribe software for clinical note transcription with the patient, who gave verbal consent to proceed.  History of Present Illness [name] is a 58 year old male who presents for follow up. He had his PFT earlier today and testing was normal.   He experiences persistent shortness of breath, which has been unchanged despite inhaler changes and typical treatment of sinus disease. Respiratory workup has been unremarkable. He describes a constant feeling of sinus congestion that impedes his breathing. No significant cough, stating it is 'not much, just a little bit.' He never saw ENT; states he was never contacted. Verified referral was sent to [facility]. No fevers, chills, hemoptysis, sore throat, leg swelling, orthopnea, PND. He had an echo recently that was normal.  He has a history of asthma but notes that his Symbicort  inhaler and rescue inhaler did not alleviate his symptoms, leading to his discontinuation. Hasn't noticed any change since stopping the inhalers.  He also experiences a lack of energy and fatigue despite consistent CPAP use. He previously tried modafinil  at a dose of 200 mg, which he believes improved his energy levels but never refilled it. He has had normal thyroid  testing and blood counts. He has not had recent vitamin B12, vitamin D , or testosterone  levels checked, which could be contributing to his fatigue. Denies any abnormal bruising, lightheadedness/dizziness, night sweats, weight change.    12/20/2023-01/18/2024 CPAP 12-15 cmH2O 30/30 days; 100% >4 hr; average use 6 hr 23 min Pressure 95th 13.5 Leaks 24.2 AHI 1  No Known Allergies Immunization History  Administered Date(s) Administered   Hepatitis A, Adult 08/28/2017, 01/20/2018   Influenza Whole 03/02/2012   Influenza, High Dose Seasonal PF 06/02/2017, 03/22/2019    Influenza,inj,Quad PF,6+ Mos 02/23/2019   Influenza-Unspecified 03/10/2013   PFIZER(Purple Top)SARS-COV-2 Vaccination 08/14/2019, 09/03/2019, 04/15/2020   PNEUMOCOCCAL CONJUGATE-20 11/13/2020   Tdap 06/18/2011, 08/14/2021   Zoster Recombinant(Shingrix ) 08/14/2021, 12/16/2021   Past Medical History:  Diagnosis Date   Anxiety    Arthritis    knees - no med   Cluster headache    Hx - resolved per patient   Coronary artery disease    30% dLAD, 30% pLCX 10/17/22   Depression    DVT of lower extremity (deep venous thrombosis) (HCC) 6/26-28/2013   Xarelto  - resolved - history   History of kidney stones    HTN (hypertension)    currently no meds   Irritability and anger    PMH of   Kidney stone    passed stone, no surgery required.   Mild intermittent asthma without complication 11/13/2020   Rotator cuff arthropathy 2007   right   Saddle pulmonary embolus (HCC) 6/26-28/2013   post orthopedic surgery - resolved   Sleep apnea 09/2013   moderate OSA-has cpap but does not use it    Tobacco History: Social History   Tobacco Use  Smoking Status Former   Types: Cigars   Quit date: 11/24/2023   Years since quitting: 0.1   Passive exposure: Never  Smokeless Tobacco Never  Tobacco Comments   every 3-4 months   Counseling given: Not Answered Tobacco comments: every 3-4 months   Outpatient Medications Prior to Visit  Medication Sig Dispense Refill   ALPRAZolam  (XANAX ) 0.5 MG tablet Take 1 tablet (0.5 mg total) by mouth 2 (two) times daily as needed for anxiety. 180 tablet 0   Cholecalciferol  (VITAMIN D3) 50 MCG (2000 UT) capsule Take 2,000 Units by mouth daily.     fluticasone  (FLONASE ) 50 MCG/ACT nasal spray SPRAY 2 SPRAYS INTO EACH NOSTRIL EVERY DAY (Patient taking differently: as needed.) 16 mL 2   furosemide  (LASIX ) 20 MG tablet Take 1 tablet (20 mg total) by mouth daily. You can take an additional dose of Lasix  as needed for SOB, Lower extremity edema or a weight gain of 2-3  lbs overnight or 5 lbs in a week. 90 tablet 3   ibuprofen  (ADVIL ) 800 MG tablet Take 1 tablet (800 mg total) by mouth 2 (two) times daily as needed. 60 tablet 3   Multiple Vitamin (MULTIVITAMIN WITH MINERALS) TABS tablet Take 1 tablet by mouth daily.     nitroGLYCERIN  (NITROSTAT ) 0.3 MG SL tablet PLACE 1 TABLET (0.3 MG TOTAL) UNDER THE TONGUE 2 (TWO) TIMES DAILY AS NEEDED. 25 tablet 2   omeprazole  (PRILOSEC) 40 MG capsule TAKE 1 CAPSULE (40 MG TOTAL) BY MOUTH DAILY. 90 capsule 1   rosuvastatin  (CRESTOR ) 40 MG tablet Take 1 tablet (  40 mg total) by mouth daily. 90 tablet 3   tadalafil  (CIALIS ) 5 MG tablet Take 1 tablet (5 mg total) by mouth daily. 90 tablet 1   tirzepatide  (ZEPBOUND ) 2.5 MG/0.5ML Pen INJECT 2.5 MG SUBCUTANEOUSLY WEEKLY 2 mL 2   traMADol  (ULTRAM ) 50 MG tablet Take 1 tablet (50 mg total) by mouth every 12 (twelve) hours as needed. 180 tablet 1   modafinil  (PROVIGIL ) 100 MG tablet Take 2 tablets (200 mg total) by mouth daily. 60 tablet 1   budesonide -formoterol  (SYMBICORT ) 160-4.5 MCG/ACT inhaler Inhale 2 puffs into the lungs in the morning and at bedtime. (Patient not taking: Reported on 01/20/2024) 1 each 6   methylPREDNISolone  (MEDROL  DOSEPAK) 4 MG TBPK tablet 6 day dose pack - take as directed (Patient not taking: Reported on 01/20/2024) 21 tablet 0   No facility-administered medications prior to visit.     Review of Systems:   Constitutional: No weight loss or gain, night sweats, fevers, chills, or lassitude. +fatigue  HEENT: No headaches, difficulty swallowing, tooth/dental problems, or sore throat. No sneezing, itching, ear ache +nasal congestion, post nasal drip CV:  No chest pain, orthopnea, swelling in lower extremities, anasarca, dizziness, palpitations, syncope Resp: +shortness of breath with exertion; nocturnal and AM productive cough. No wheezing. No hemoptysis. No chest wall deformity GI:  No heartburn, indigestion Skin: No rash, lesions, ulcerations MSK:  No joint  pain or swelling.   Neuro: No dizziness or lightheadedness.  Psych: No depression or anxiety. Mood stable.  Observations/Objective: Patient is well-developed, well-nourished in no acute distress.  Resting comfortably at home.  No labored breathing.  Speech is clear and coherent with logical content.  Patient is alert and oriented at baseline.   Assessment and Plan:  Chronic sinusitis Persistent sinusitis/rhinitis seems to be the driving factor of his DOE. PFT and echo unremarkable. No perceived benefit with inhaler therapy, arguing against asthma. Nasal sprays help but symptoms persist. Discussed use of Afrin for severe congestion at night - educated to not use longer than 3-5 days consecutively avoid rebound symptoms. ENT referral resent today to [facility] for further evaluation.   - Use Flonase  one spray in the morning and one in the evening - Consider Afrin nasal spray for severe congestion, not to exceed three to five consecutive days - Continue astelin  nasal spray - Continue xyzal daily  - Refer to ENT for further evaluation  Patient Instructions   Continue to use CPAP every night, minimum of 4-6 hours a night.  Change equipment as directed. Wash your tubing with warm soap and water daily, hang to dry. Wash humidifier portion weekly. Use bottled, distilled water and change daily Be aware of reduced alertness and do not drive or operate heavy machinery if experiencing this or drowsiness.  Exercise encouraged, as tolerated. Healthy weight management discussed.  Avoid or decrease alcohol consumption and medications that make you more sleepy, if possible. Notify if persistent daytime sleepiness occurs even with consistent use of PAP therapy.   Restart modafinil  200 mg every morning as needed for fatigue. Monitor BP/HR. Monitor and notify of any mood changes or disruptions in sleep.    Continue aisupra 2 puffs every 6 hours as needed for shortness of breath or wheezing. Notify if  symptoms persist despite rescue inhaler/neb use.  Continue Flonase  nasal spray 2 sprays each nostril daily Daily allergy pill such as claritin, zyrtec, xyzal   Referral to Ear Nose and Throat   Follow up in 6 months with Dr. Jude or  Katie Isauro Skelley,NP. If symptoms do not improve or worsen, please contact office for sooner follow up      Mild intermittent asthma See above. Prior exhaled nitric oxide  testing normal and PFT normal. No benefit with inhaler therapy. He is off of maintenance therapy at this time. Will continue to monitor. Continue PRN SABA. Action plan in place  OSA (obstructive sleep apnea) OSA well-controlled with CPAP. Receives benefit from use. Aware of risks of untreated OSA. Understands proper use/care of device. Healthy weight loss encourage. Experiences daytime fatigue despite consistent CPAP use. Prior benefit from modafinil  - will resume. Informed of potential side effects. Educated on proper use. Safe driving practices reviewed - Continue CPAP therapy - Restart modafinil  200 mg in the morning for daytime fatigue. No aberrant behavior. PDMP reviewed.    Disorder of excessive somnolence Follow up with PCP for further testing if not improved with modafinil   Morbid obesity (HCC) Healthy weight loss encouraged. Low ERV indicating obesity likely a contributing factor to DOE    I discussed the assessment and treatment plan with the patient. The patient was provided an opportunity to ask questions and all were answered. The patient agreed with the plan and demonstrated an understanding of the instructions.   The patient was advised to call back or seek an in-person evaluation if the symptoms worsen or if the condition fails to improve as anticipated.  I provided 35 minutes of non-face-to-face time during this encounter.   Comer LULLA Rouleau, NP

## 2024-01-20 NOTE — Progress Notes (Signed)
 Full PFT performed today.

## 2024-01-20 NOTE — Patient Instructions (Signed)
 Full PFT performed today.

## 2024-01-20 NOTE — Patient Instructions (Addendum)
 Continue to use CPAP every night, minimum of 4-6 hours a night.  Change equipment as directed. Wash your tubing with warm soap and water daily, hang to dry. Wash humidifier portion weekly. Use bottled, distilled water and change daily Be aware of reduced alertness and do not drive or operate heavy machinery if experiencing this or drowsiness.  Exercise encouraged, as tolerated. Healthy weight management discussed.  Avoid or decrease alcohol consumption and medications that make you more sleepy, if possible. Notify if persistent daytime sleepiness occurs even with consistent use of PAP therapy.   Start modafinil  200 mg every morning as needed for fatigue. Monitor BP/HR. Monitor and notify of any mood changes or disruptions in sleep.    Continue aisupra 2 puffs every 6 hours as needed for shortness of breath or wheezing. Notify if symptoms persist despite rescue inhaler/neb use.  Continue Flonase  nasal spray 2 sprays each nostril daily Daily allergy pill such as claritin, zyrtec, xyzal    PFT and chest x ray at follow up   Start omeprazole  40 mg daily in morning, 30 minutes before breakfast for reflux for the next 6 weeks   Referral to Ear Nose and Throat   Follow up in 6=8 weeks with Dr. Jude or Izetta Malachy PIETY. If symptoms do not improve or worsen, please contact office for sooner follow up

## 2024-01-21 ENCOUNTER — Encounter (INDEPENDENT_AMBULATORY_CARE_PROVIDER_SITE_OTHER): Payer: Self-pay

## 2024-01-21 ENCOUNTER — Encounter: Payer: Self-pay | Admitting: Nurse Practitioner

## 2024-01-21 NOTE — Assessment & Plan Note (Signed)
 Healthy weight loss encouraged. Low ERV indicating obesity likely a contributing factor to DOE

## 2024-01-21 NOTE — Assessment & Plan Note (Signed)
 See above. Prior exhaled nitric oxide  testing normal and PFT normal. No benefit with inhaler therapy. He is off of maintenance therapy at this time. Will continue to monitor. Continue PRN SABA. Action plan in place

## 2024-01-21 NOTE — Assessment & Plan Note (Addendum)
 Persistent sinusitis/rhinitis seems to be the driving factor of his DOE. PFT and echo unremarkable. No perceived benefit with inhaler therapy, arguing against asthma. Nasal sprays help but symptoms persist. Discussed use of Afrin for severe congestion at night - educated to not use longer than 3-5 days consecutively avoid rebound symptoms. ENT referral resent today to [facility] ENT for further evaluation.   - Use Flonase  one spray in the morning and one in the evening - Consider Afrin nasal spray for severe congestion, not to exceed three to five consecutive days - Continue astelin  nasal spray - Continue xyzal daily  - Refer to ENT for further evaluation  Patient Instructions   Continue to use CPAP every night, minimum of 4-6 hours a night.  Change equipment as directed. Wash your tubing with warm soap and water daily, hang to dry. Wash humidifier portion weekly. Use bottled, distilled water and change daily Be aware of reduced alertness and do not drive or operate heavy machinery if experiencing this or drowsiness.  Exercise encouraged, as tolerated. Healthy weight management discussed.  Avoid or decrease alcohol consumption and medications that make you more sleepy, if possible. Notify if persistent daytime sleepiness occurs even with consistent use of PAP therapy.   Restart modafinil  200 mg every morning as needed for fatigue. Monitor BP/HR. Monitor and notify of any mood changes or disruptions in sleep.    Continue aisupra 2 puffs every 6 hours as needed for shortness of breath or wheezing. Notify if symptoms persist despite rescue inhaler/neb use.  Continue Flonase  nasal spray 2 sprays each nostril daily Daily allergy pill such as claritin, zyrtec, xyzal   Referral to Ear Nose and Throat   Follow up in 6 months with Dr. Jude or Izetta Malachy PIETY. If symptoms do not improve or worsen, please contact office for sooner follow up

## 2024-01-21 NOTE — Assessment & Plan Note (Signed)
 Follow up with PCP for further testing if not improved with modafinil 

## 2024-01-21 NOTE — Assessment & Plan Note (Signed)
 OSA well-controlled with CPAP. Receives benefit from use. Aware of risks of untreated OSA. Understands proper use/care of device. Healthy weight loss encourage. Experiences daytime fatigue despite consistent CPAP use. Prior benefit from modafinil  - will resume. Informed of potential side effects. Educated on proper use. Safe driving practices reviewed - Continue CPAP therapy - Restart modafinil  200 mg in the morning for daytime fatigue. No aberrant behavior. PDMP reviewed.

## 2024-01-22 ENCOUNTER — Encounter: Payer: Self-pay | Admitting: Family Medicine

## 2024-01-22 ENCOUNTER — Ambulatory Visit: Admitting: Family Medicine

## 2024-01-22 VITALS — HR 111 | Ht 67.0 in | Wt 256.0 lb

## 2024-01-22 DIAGNOSIS — M170 Bilateral primary osteoarthritis of knee: Secondary | ICD-10-CM

## 2024-01-22 NOTE — Patient Instructions (Addendum)
 Injected both knees [place] DHEA 50mg  for 4 weeks then take 2 week break See me gain in 10 weeks

## 2024-01-22 NOTE — Assessment & Plan Note (Signed)
 Chronic, with bone-on-bone osteoarthritic changes.  Patient is still trying to lose weight at the moment.  Patient's BMI is now down to 40.  Would like to get a little lower and is looking at doing it sometime next year.  Patient has done well and encouraged him to continue to lose weight but I think he is within 10 pounds at this time.  Will follow-up with the orthopedic on that.  Knows we will not be able to do injections within 10 weeks of surgery.  Follow-up 10 weeks from now.

## 2024-01-26 ENCOUNTER — Ambulatory Visit: Admitting: Podiatry

## 2024-01-28 ENCOUNTER — Ambulatory Visit: Payer: No Typology Code available for payment source | Admitting: Internal Medicine

## 2024-01-31 ENCOUNTER — Other Ambulatory Visit: Payer: Self-pay | Admitting: Family Medicine

## 2024-02-11 ENCOUNTER — Ambulatory Visit: Admitting: Podiatry

## 2024-02-11 ENCOUNTER — Encounter: Payer: Self-pay | Admitting: Podiatry

## 2024-02-11 DIAGNOSIS — M722 Plantar fascial fibromatosis: Secondary | ICD-10-CM

## 2024-02-11 DIAGNOSIS — B353 Tinea pedis: Secondary | ICD-10-CM

## 2024-02-11 MED ORDER — TRIAMCINOLONE ACETONIDE 40 MG/ML IJ SUSP
40.0000 mg | Freq: Once | INTRAMUSCULAR | Status: AC
  Administered 2024-02-11: 40 mg

## 2024-02-11 MED ORDER — TERBINAFINE HCL 250 MG PO TABS: 250.0000 mg | ORAL_TABLET | Freq: Every day | ORAL | 0 refills | Status: AC

## 2024-02-11 NOTE — Progress Notes (Signed)
 He presents today for a follow-up of his plantar fasciitis right foot hurts over the left.  Last time he was and stated both had been bothering him and he states that he has had a reoccurrence of his plantar fasciitis.  Objective: Vital signs stable oriented x 3 pulses are palpable.  He has pain on palpation MucoClear tubercle bilateral right greater than left.  He has tinea pedis on the plantar aspect of the bilateral foot a moccasin type distribution and some nails that are questionable for onychomycosis.  Assessment: Plantar fasciitis bilateral right greater than left.  Tinea pedis bilateral.  Plan: Prescribed Lamisil  250 mg tablets 1 tablet daily for the next 30 days.  I injected the bilateral heels today and we are going to get him scheduled to see Lolita for orthotics.

## 2024-02-18 ENCOUNTER — Other Ambulatory Visit: Payer: Self-pay | Admitting: Podiatry

## 2024-03-16 ENCOUNTER — Ambulatory Visit

## 2024-03-16 ENCOUNTER — Telehealth (INDEPENDENT_AMBULATORY_CARE_PROVIDER_SITE_OTHER): Payer: Self-pay | Admitting: Otolaryngology

## 2024-03-16 NOTE — Progress Notes (Signed)
 Orthotics   Patient was present and evaluated for Custom molded foot orthotics. Patient will benefit from CFO's to provide total contact to BIL MLA's helping to balance and distribute body weight more evenly across BIL feet helping to reduce plantar pressure and pain. Orthotic will also encourage FF / RF alignment  Patient was scanned today and will return for fitting upon receipt   Family ded met with 20% coins  Remaining   Patient is aware  [name] Cped, CFo, CFm

## 2024-03-16 NOTE — Telephone Encounter (Signed)
 LVM for patient to call back to reschedule 03/17/2024 appointment - provider meeting

## 2024-03-17 ENCOUNTER — Telehealth (INDEPENDENT_AMBULATORY_CARE_PROVIDER_SITE_OTHER): Payer: Self-pay | Admitting: Otolaryngology

## 2024-03-17 ENCOUNTER — Institutional Professional Consult (permissible substitution) (INDEPENDENT_AMBULATORY_CARE_PROVIDER_SITE_OTHER): Admitting: Otolaryngology

## 2024-03-17 NOTE — Telephone Encounter (Signed)
 I returned the patient's voicemail to get his appointment moved, I left a voicemail explaining the doctor will not be in the office at that time today and for him to call back and we would be glad to get his appointment rescheduled.

## 2024-03-22 ENCOUNTER — Telehealth: Payer: Self-pay

## 2024-03-22 NOTE — Telephone Encounter (Signed)
 Referral already in the system and patient is aware. Patient had a few questions that I answered to help ease his mind until Hematology reaches out to him. He gave me a verbal understanding and states he will call us  back if he has anymore questions.

## 2024-03-22 NOTE — Telephone Encounter (Signed)
 Patient return call. ?

## 2024-03-22 NOTE — Telephone Encounter (Signed)
 Copied from CRM [id number]. Topic: General - Other >> Mar 22, 2024 11:50 AM Macario HERO wrote: Reason for CRM: Patient said he went to the vein specialist and they found a blood clot in his leg and he is requesting a referral. Advised would need an appointment with provider. Patient requesting a call from the nurse.

## 2024-03-22 NOTE — Telephone Encounter (Signed)
 Unable to reach patient. LMTRC

## 2024-03-26 ENCOUNTER — Other Ambulatory Visit: Payer: Self-pay | Admitting: Internal Medicine

## 2024-03-26 DIAGNOSIS — G4733 Obstructive sleep apnea (adult) (pediatric): Secondary | ICD-10-CM

## 2024-03-29 ENCOUNTER — Telehealth: Payer: Self-pay | Admitting: *Deleted

## 2024-03-29 NOTE — Progress Notes (Unsigned)
 [name] [address] [place] [address] Phone: [phone] Subjective:   [name] [name] am a scribe for Dr. Claudene.   I'm seeing this patient by the request  of:  [name] Debby CROME, MD  CC: Bilateral knee pain  YEP:Dlagzrupcz  01/22/2024 Chronic, with bone-on-bone osteoarthritic changes.  Patient is still trying to lose weight at the moment.  Patient's BMI is now down to 40.  Would like to get a little lower and is looking at doing it sometime next year.  Patient has done well and encouraged him to continue to lose weight but I think he is within 10 pounds at this time.  Will follow-up with the orthopedic on that.  Knows we will not be able to do injections within 10 weeks of surgery.  Follow-up 10 weeks from now.     Updated 04/01/2024 [name] is a 58 y.o. male coming in with complaint of B knee pain. Patient states his knees are really bad today. They feel like that they are going to give out especially the left side.         Past Medical History:  Diagnosis Date   Anxiety    Arthritis    knees - no med   Cluster headache    Hx - resolved per patient   Coronary artery disease    30% dLAD, 30% pLCX 10/17/22   Depression    DVT of lower extremity (deep venous thrombosis) (HCC) 6/26-28/2013   Xarelto  - resolved - history   History of kidney stones    HTN (hypertension)    currently no meds   Irritability and anger    PMH of   Kidney stone    passed stone, no surgery required.   Mild intermittent asthma without complication 11/13/2020   Rotator cuff arthropathy 2007   right   Saddle pulmonary embolus (HCC) 6/26-28/2013   post orthopedic surgery - resolved   Sleep apnea 09/2013   moderate OSA-has cpap but does not use it   Past Surgical History:  Procedure Laterality Date   arthroscopic knee surgery  10/09/2011   Dr Vernetta   CARDIAC CATHETERIZATION  06/02/2005   LVdysfunction; normal coronaries   COLONOSCOPY      EXTRACORPOREAL SHOCK WAVE LITHOTRIPSY Left 09/05/2022   Procedure: EXTRACORPOREAL SHOCK WAVE LITHOTRIPSY (ESWL);  Surgeon: Nieves Cough, MD;  Location[facility];  Service: Urology;  Laterality: Left;   EXTRACORPOREAL SHOCK WAVE LITHOTRIPSY Left 11/21/2022   Procedure: EXTRACORPOREAL SHOCK WAVE LITHOTRIPSY (ESWL);  Surgeon: Nieves Cough, MD;  Location: [facility];  Service: Urology;  Laterality: Left;   KNEE ARTHROSCOPY WITH MEDIAL MENISECTOMY Left 09/20/2014   medial menisectomy chondroplastytella medial plica excision  ;  Surgeon: Norleen Gavel, MD;  Location: [facility];  Service: Orthopedics;  Laterality: Left;   LEFT HEART CATH AND CORONARY ANGIOGRAPHY N/A 10/17/2022   Procedure: LEFT HEART CATH AND CORONARY ANGIOGRAPHY;  Surgeon: Wendel Lurena POUR, MD;  [facility];  Service: Cardiovascular;  Laterality: N/A;   ROTATOR CUFF REPAIR  06/02/2005   right   UMBILICAL HERNIA REPAIR N/A 10/06/2023   Procedure: REPAIR, HERNIA, UMBILICAL, ADULT;  Surgeon: Vanderbilt Debby, MD;  Location: [facility];  Service: General;  Laterality: N/A;  WITH MESH   UPPER GASTROINTESTINAL ENDOSCOPY  06/02/2005   normal   WISDOM TOOTH EXTRACTION     Social History   Socioeconomic History   Marital status: Married  Spouse name: Not on file   Number of children: 6   Years of education: 28   Highest education level: Not on file  Occupational History   Occupation: Location manager    Occupation: Lobbyist  Tobacco Use   Smoking status: Former    Types: Cigars    Quit date: 11/24/2023    Years since quitting: 0.3    Passive exposure: Never   Smokeless tobacco: Never   Tobacco comments:    every 3-4 months  Vaping Use   Vaping status: Never Used  Substance and Sexual Activity   Alcohol use: Not Currently   Drug use: No   Sexual activity: Yes    Partners: Female    Birth control/protection: Condom  Other Topics Concern   Not on  file  Social History Narrative   Fun: Exercise   Social Drivers of Health   Financial Resource Strain: Not on file  Food Insecurity: Not on file  Transportation Needs: Not on file  Physical Activity: Not on file  Stress: Not on file  Social Connections: Unknown (10/11/2021)   Received from [employer]   Social Network    Social Network: Not on file   No Known Allergies Family History  Problem Relation Age of Onset   Cancer Mother        Mesothelioma    Diabetes Brother    Hypertension Brother    Stroke Brother 50   Heart attack Brother 55   Diabetes Maternal Aunt    Diabetes Maternal Grandmother    Heart disease Maternal Grandmother    Hypertension Maternal Grandmother    Stroke Maternal Grandmother        in 44s   Deep vein thrombosis Maternal Grandmother    Stroke Maternal Grandfather        > 55   Migraines Brother    Heart attack Brother 19   Healthy Father    Colon cancer Neg Hx    Esophageal cancer Neg Hx    Rectal cancer Neg Hx    Stomach cancer Neg Hx    Liver cancer Neg Hx    Pancreatic cancer Neg Hx    Other Neg Hx        low testosterone      Current Outpatient Medications (Cardiovascular):    furosemide  (LASIX ) 20 MG tablet, Take 1 tablet (20 mg total) by mouth daily. You can take an additional dose of Lasix  as needed for SOB, Lower extremity edema or a weight gain of 2-3 lbs overnight or 5 lbs in a week.   nitroGLYCERIN  (NITROSTAT ) 0.3 MG SL tablet, PLACE 1 TABLET (0.3 MG TOTAL) UNDER THE TONGUE 2 (TWO) TIMES DAILY AS NEEDED.   rosuvastatin  (CRESTOR ) 40 MG tablet, Take 1 tablet (40 mg total) by mouth daily.   tadalafil  (CIALIS ) 5 MG tablet, Take 1 tablet (5 mg total) by mouth daily.  Current Outpatient Medications (Respiratory):    fluticasone  (FLONASE ) 50 MCG/ACT nasal spray, SPRAY 2 SPRAYS INTO EACH NOSTRIL EVERY DAY (Patient taking differently: as needed.)  Current Outpatient Medications (Analgesics):    ibuprofen  (ADVIL ) 800 MG tablet, TAKE 1  TABLET BY MOUTH 2 TIMES DAILY AS NEEDED.   traMADol  (ULTRAM ) 50 MG tablet, Take 1 tablet (50 mg total) by mouth every 12 (twelve) hours as needed.   Current Outpatient Medications (Other):    ALPRAZolam  (XANAX ) 0.5 MG tablet, Take 1 tablet (0.5 mg total) by mouth 2 (two) times daily as needed for anxiety.   Cholecalciferol  (  VITAMIN D3) 50 MCG (2000 UT) capsule, Take 2,000 Units by mouth daily.   modafinil  (PROVIGIL ) 200 MG tablet, Take 1 tablet (200 mg total) by mouth daily.   Multiple Vitamin (MULTIVITAMIN WITH MINERALS) TABS tablet, Take 1 tablet by mouth daily.   omeprazole  (PRILOSEC) 40 MG capsule, TAKE 1 CAPSULE (40 MG TOTAL) BY MOUTH DAILY.   tirzepatide  (ZEPBOUND ) 2.5 MG/0.5ML Pen, INJECT 2.5 MG SUBCUTANEOUSLY WEEKLY   Reviewed prior external information including notes and imaging from  primary care provider As well as notes that were available from care everywhere and other healthcare systems.  Past medical history, social, surgical and family history all reviewed in electronic medical record.  No pertanent information unless stated regarding to the chief complaint.   Review of Systems:  No headache, visual changes, nausea, vomiting, diarrhea, constipation, dizziness, abdominal pain, skin rash, fevers, chills, night sweats, weight loss, swollen lymph nodes, body aches, joint swelling, chest pain, shortness of breath, mood changes. POSITIVE muscle aches  Objective  Blood pressure 130/80, pulse 85, height 5' 7 (1.702 m), weight 246 lb (111.6 kg), SpO2 97%.   General: No apparent distress alert and oriented x3 mood and affect normal, dressed appropriately.  HEENT: Pupils equal, extraocular movements intact  Respiratory: Patient's speak in full sentences and does not appear short of breath  Cardiovascular: No lower extremity edema, non tender, no erythema  Severe arthritic changes of the knees.  Varus deformity of the knee is noted.  Patient does have significant effusion of the  knees bilaterally.  After informed written and verbal consent, patient was seated on exam table. Right knee was prepped with alcohol swab and utilizing anterolateral approach, patient's right knee space was injected with 4:1  marcaine  0.5%: Depo-Medrol  40mg /dL. Patient tolerated the procedure well without immediate complications.  After informed written and verbal consent, patient was seated on exam table. Left knee was prepped with alcohol swab and utilizing anterolateral approach, patient's left knee space was injected with 4:1  marcaine  0.5%: Depo-Medrol  40mg /dL. Patient tolerated the procedure well without immediate complications.    Impression and Recommendations:    The above documentation has been reviewed and is accurate and complete Relena Ivancic M Stran Raper, DO

## 2024-03-29 NOTE — Telephone Encounter (Signed)
 Received VM from pt stating he has been referred to our office for the treatment of blood clots.  Pt states he has called several times to get scheduled with no return call back.  RN forwarded VM to practice administer for follow up.

## 2024-04-01 ENCOUNTER — Ambulatory Visit: Admitting: Family Medicine

## 2024-04-01 ENCOUNTER — Encounter: Payer: Self-pay | Admitting: Family Medicine

## 2024-04-01 VITALS — BP 130/80 | HR 85 | Ht 67.0 in | Wt 246.0 lb

## 2024-04-01 DIAGNOSIS — M170 Bilateral primary osteoarthritis of knee: Secondary | ICD-10-CM

## 2024-04-01 DIAGNOSIS — I872 Venous insufficiency (chronic) (peripheral): Secondary | ICD-10-CM | POA: Diagnosis not present

## 2024-04-01 DIAGNOSIS — D6851 Activated protein C resistance: Secondary | ICD-10-CM | POA: Diagnosis not present

## 2024-04-01 MED ORDER — METHYLPREDNISOLONE ACETATE 40 MG/ML IJ SUSP
40.0000 mg | Freq: Once | INTRAMUSCULAR | Status: AC
  Administered 2024-04-01: 80 mg via INTRA_ARTICULAR

## 2024-04-01 NOTE — Assessment & Plan Note (Signed)
 Will repeat injection given April 01, 2024.  Responded well to the injections.  Unfortunately patient recently had another clot likely secondary to his factor V Leiden mutation.  Patient will be on Eliquis long-term now.  Patient will not be able to do any anti-inflammatories.  Follow-up again 10 to 12 weeks otherwise.

## 2024-04-04 ENCOUNTER — Encounter: Payer: Self-pay | Admitting: Radiology

## 2024-04-13 ENCOUNTER — Other Ambulatory Visit

## 2024-04-25 ENCOUNTER — Inpatient Hospital Stay: Attending: Hematology and Oncology | Admitting: Hematology and Oncology

## 2024-04-25 ENCOUNTER — Inpatient Hospital Stay

## 2024-04-25 VITALS — BP 146/100 | HR 84 | Temp 98.1°F | Resp 16 | Wt 248.3 lb

## 2024-04-25 DIAGNOSIS — I2692 Saddle embolus of pulmonary artery without acute cor pulmonale: Secondary | ICD-10-CM

## 2024-04-25 DIAGNOSIS — Z87442 Personal history of urinary calculi: Secondary | ICD-10-CM | POA: Insufficient documentation

## 2024-04-25 DIAGNOSIS — Z86711 Personal history of pulmonary embolism: Secondary | ICD-10-CM | POA: Insufficient documentation

## 2024-04-25 DIAGNOSIS — Z803 Family history of malignant neoplasm of breast: Secondary | ICD-10-CM | POA: Insufficient documentation

## 2024-04-25 DIAGNOSIS — Z86718 Personal history of other venous thrombosis and embolism: Secondary | ICD-10-CM | POA: Insufficient documentation

## 2024-04-25 DIAGNOSIS — Z7901 Long term (current) use of anticoagulants: Secondary | ICD-10-CM | POA: Insufficient documentation

## 2024-04-25 DIAGNOSIS — Z79899 Other long term (current) drug therapy: Secondary | ICD-10-CM | POA: Insufficient documentation

## 2024-04-25 DIAGNOSIS — M1990 Unspecified osteoarthritis, unspecified site: Secondary | ICD-10-CM | POA: Insufficient documentation

## 2024-04-25 DIAGNOSIS — I10 Essential (primary) hypertension: Secondary | ICD-10-CM | POA: Insufficient documentation

## 2024-04-25 DIAGNOSIS — J4520 Mild intermittent asthma, uncomplicated: Secondary | ICD-10-CM | POA: Insufficient documentation

## 2024-04-25 DIAGNOSIS — G4733 Obstructive sleep apnea (adult) (pediatric): Secondary | ICD-10-CM | POA: Insufficient documentation

## 2024-04-25 DIAGNOSIS — Z87891 Personal history of nicotine dependence: Secondary | ICD-10-CM | POA: Insufficient documentation

## 2024-04-25 DIAGNOSIS — I2510 Atherosclerotic heart disease of native coronary artery without angina pectoris: Secondary | ICD-10-CM | POA: Insufficient documentation

## 2024-04-25 LAB — CMP (CANCER CENTER ONLY)
ALT: 22 U/L (ref 0–44)
AST: 19 U/L (ref 15–41)
Albumin: 4.2 g/dL (ref 3.5–5.0)
Alkaline Phosphatase: 56 U/L (ref 38–126)
Anion gap: 8 (ref 5–15)
BUN: 12 mg/dL (ref 6–20)
CO2: 28 mmol/L (ref 22–32)
Calcium: 9.5 mg/dL (ref 8.9–10.3)
Chloride: 104 mmol/L (ref 98–111)
Creatinine: 0.82 mg/dL (ref 0.61–1.24)
GFR, Estimated: >60 mL/min (ref >60)
Glucose, Bld: 91 mg/dL (ref 70–99)
Potassium: 4.4 mmol/L (ref 3.5–5.1)
Sodium: 140 mmol/L (ref 135–145)
Total Bilirubin: 0.2 mg/dL (ref 0.0–1.2)
Total Protein: 7.4 g/dL (ref 6.5–8.1)

## 2024-04-25 LAB — CBC WITH DIFFERENTIAL (CANCER CENTER ONLY)
Abs Immature Granulocytes: 0.02 K/uL (ref 0.00–0.07)
Basophils Absolute: 0.1 K/uL (ref 0.0–0.1)
Basophils Relative: 1 %
Eosinophils Absolute: 0.2 K/uL (ref 0.0–0.5)
Eosinophils Relative: 3 %
HCT: 39.8 % (ref 39.0–52.0)
Hemoglobin: 13.4 g/dL (ref 13.0–17.0)
Immature Granulocytes: 0 %
Lymphocytes Relative: 24 %
Lymphs Abs: 2.2 K/uL (ref 0.7–4.0)
MCH: 30 pg (ref 26.0–34.0)
MCHC: 33.7 g/dL (ref 30.0–36.0)
MCV: 89 fL (ref 80.0–100.0)
Monocytes Absolute: 0.7 K/uL (ref 0.1–1.0)
Monocytes Relative: 8 %
Neutro Abs: 5.7 K/uL (ref 1.7–7.7)
Neutrophils Relative %: 64 %
Platelet Count: 221 K/uL (ref 150–400)
RBC: 4.47 MIL/uL (ref 4.22–5.81)
RDW: 13.3 % (ref 11.5–15.5)
WBC Count: 8.9 K/uL (ref 4.0–10.5)
nRBC: 0 % (ref 0.0–0.2)

## 2024-04-25 NOTE — Progress Notes (Unsigned)
 [facility] [phone]   [phone]  INITIAL CONSULT NOTE  Patient Care Team: Joshua Debby CROME, MD as PCP - General (Internal Medicine) Pietro Redell RAMAN, MD as PCP - Cardiology (Cardiology)  Hematological/Oncological History # History of DVT/PE 10/08/2011: Patient underwent knee surgery. 11/26/2011: Patient underwent a CT PE study due to left lower extremity DVT.  Found to have a large saddle type pulmonary embolus with emboli extending into the proximal segmental arteries bilaterally. 03/22/2024: LLE US  showed non-occlusive DVT in femoral vein distal thigh and popliteal vein.  04/25/2024: Establish care with Dr. Federico.  CHIEF COMPLAINTS/PURPOSE OF CONSULTATION:  History of DVT/PE   HISTORY OF PRESENTING ILLNESS:  [name] 58 y.o. male with medical history significant for OSA, kidney stones, hypertension, coronary artery disease, and arthritis who presents for evaluation of prior DVT and PE.  On review of the previous records [place] had a DVT on 11/26/2011 and developed a subsequent PE.  This was thought to be provoked due to her knee surgery on 10/08/2011.  Most recently on 03/22/2024 patient developed a left lower extremity DVT in the femoral vein distal to the thigh and popliteal vein.  This was after having ablation performed on his varicose veins of his lower extremity.  Due to concerns for his recurrent VTE's he was referred to hematology for further evaluation and management.  On exam today Mr. Gaglio reports that he believes he was on Xarelto  therapy for several months after his prior VTE in 2013.  He tolerated it well.  He is currently on Eliquis therapy 5 mg twice daily and tolerating it without any bleeding, bruising, or dark stools.  He reports that he feels like his leg is improving but is still swollen and tight at times.  He reports that he has had no trouble with pain and is not having any shortness of breath or chest  pain.  On further discussion the patient reports that his mother had cancer and his father had a heart attack.  He reports that his brother had a heart attack and stroke as did his maternal grandmother.  He has 6 children all of whom are healthy with exception of his oldest daughter who had an MI.  Patient reports that he is a cigar smoker and smokes a cigar about once per month.  He reports he drinks alcohol the weekends, up to 12 beers and 4-5 shots.  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  He notes nothing else out of the ordinary.  Unfortunately he was on aspirin  when this most recent blood clot did occur.  He notes that he is not having any fevers, chills, sweats, nausea, vomiting or diarrhea.  Full 10 point ROS otherwise negative.  MEDICAL HISTORY:  Past Medical History:  Diagnosis Date   Anxiety    Arthritis    knees - no med   Cluster headache    Hx - resolved per patient   Coronary artery disease    30% dLAD, 30% pLCX 10/17/22   Depression    DVT of lower extremity (deep venous thrombosis) (HCC) 6/26-28/2013   Xarelto  - resolved - history   History of kidney stones    HTN (hypertension)    currently no meds   Irritability and anger    PMH of   Kidney stone    passed stone, no surgery required.   Mild intermittent asthma without complication 11/13/2020  Rotator cuff arthropathy 2007   right   Saddle pulmonary embolus (HCC) 6/26-28/2013   post orthopedic surgery - resolved   Sleep apnea 09/2013   moderate OSA-has cpap but does not use it    SURGICAL HISTORY: Past Surgical History:  Procedure Laterality Date   arthroscopic knee surgery  10/09/2011   Dr Vernetta   CARDIAC CATHETERIZATION  06/02/2005   LVdysfunction; normal coronaries   COLONOSCOPY     EXTRACORPOREAL SHOCK WAVE LITHOTRIPSY Left 09/05/2022   Procedure: EXTRACORPOREAL SHOCK WAVE LITHOTRIPSY (ESWL);  Surgeon: Nieves Cough, MD;  Location: [facility];  Service: Urology;  Laterality: Left;   EXTRACORPOREAL SHOCK WAVE LITHOTRIPSY Left 11/21/2022   Procedure: EXTRACORPOREAL SHOCK WAVE LITHOTRIPSY (ESWL);  Surgeon: Nieves Cough, MD;  Location: [facility];  Service: Urology;  Laterality: Left;   KNEE ARTHROSCOPY WITH MEDIAL MENISECTOMY Left 09/20/2014   medial menisectomy chondroplastytella medial plica excision  ;  Surgeon: Norleen Gavel, MD;  Location: [facility];  Service: Orthopedics;  Laterality: Left;   LEFT HEART CATH AND CORONARY ANGIOGRAPHY N/A 10/17/2022   Procedure: LEFT HEART CATH AND CORONARY ANGIOGRAPHY;  Surgeon: Wendel Lurena POUR, MD;  [facility]: [facility];  Service: Cardiovascular;  Laterality: N/A;   ROTATOR CUFF REPAIR  06/02/2005   right   UMBILICAL HERNIA REPAIR N/A 10/06/2023   Procedure: REPAIR, HERNIA, UMBILICAL, ADULT;  Surgeon: Vanderbilt Ned, MD;  Location: [facility];  Service: General;  Laterality: N/A;  WITH MESH   UPPER GASTROINTESTINAL ENDOSCOPY  06/02/2005   normal   WISDOM TOOTH EXTRACTION      SOCIAL HISTORY: Social History   Socioeconomic History   Marital status: Married    Spouse name: Not on file   Number of children: 6   Years of education: 11   Highest education level: Not on file  Occupational History   Occupation: Location manager    Occupation: Lobbyist  Tobacco Use   Smoking status: Former    Types: Cigars    Quit date: 11/24/2023    Years since quitting: 0.4    Passive exposure: Never   Smokeless tobacco: Never   Tobacco comments:    every 3-4 months  Vaping Use   Vaping status: Never Used  Substance and Sexual Activity   Alcohol use: Not Currently   Drug use: No   Sexual activity: Yes    Partners: Female    Birth control/protection: Condom  Other Topics Concern   Not on file  Social History Narrative   Fun: Exercise   Social Drivers of Health   Financial Resource Strain: Not on file  Food Insecurity: Not on file   Transportation Needs: Not on file  Physical Activity: Not on file  Stress: Not on file  Social Connections: Unknown (10/11/2021)   Received from [facility]   Social Network    Social Network: Not on file  Intimate Partner Violence: Unknown (09/02/2021)   Received from [facility]   HITS    Physically Hurt: Not on file    Insult or Talk Down To: Not on file    Threaten Physical Harm: Not on file    Scream or Curse: Not on file    FAMILY HISTORY: Family History  Problem Relation Age of Onset   Cancer Mother        Mesothelioma    Diabetes Brother    Hypertension Brother    Stroke Brother 50   Heart attack Brother 24  Diabetes Maternal Aunt    Diabetes Maternal Grandmother    Heart disease Maternal Grandmother    Hypertension Maternal Grandmother    Stroke Maternal Grandmother        in 30s   Deep vein thrombosis Maternal Grandmother    Stroke Maternal Grandfather        > 55   Migraines Brother    Heart attack Brother 66   Healthy Father    Colon cancer Neg Hx    Esophageal cancer Neg Hx    Rectal cancer Neg Hx    Stomach cancer Neg Hx    Liver cancer Neg Hx    Pancreatic cancer Neg Hx    Other Neg Hx        low testosterone     ALLERGIES:  has no known allergies.  MEDICATIONS:  Current Outpatient Medications  Medication Sig Dispense Refill   ELIQUIS 5 MG TABS tablet Take 5 mg by mouth 2 (two) times daily.     ALPRAZolam  (XANAX ) 0.5 MG tablet Take 1 tablet (0.5 mg total) by mouth 2 (two) times daily as needed for anxiety. 180 tablet 0   Cholecalciferol  (VITAMIN D3) 50 MCG (2000 UT) capsule Take 2,000 Units by mouth daily.     fluticasone  (FLONASE ) 50 MCG/ACT nasal spray SPRAY 2 SPRAYS INTO EACH NOSTRIL EVERY DAY (Patient taking differently: as needed.) 16 mL 2   furosemide  (LASIX ) 20 MG tablet Take 1 tablet (20 mg total) by mouth daily. You can take an additional dose of Lasix  as needed for SOB, Lower extremity edema or a weight gain of 2-3 lbs overnight or 5  lbs in a week. 90 tablet 3   ibuprofen  (ADVIL ) 800 MG tablet TAKE 1 TABLET BY MOUTH 2 TIMES DAILY AS NEEDED. 60 tablet 3   modafinil  (PROVIGIL ) 200 MG tablet Take 1 tablet (200 mg total) by mouth daily. 30 tablet 5   Multiple Vitamin (MULTIVITAMIN WITH MINERALS) TABS tablet Take 1 tablet by mouth daily.     nitroGLYCERIN  (NITROSTAT ) 0.3 MG SL tablet PLACE 1 TABLET (0.3 MG TOTAL) UNDER THE TONGUE 2 (TWO) TIMES DAILY AS NEEDED. 25 tablet 2   omeprazole  (PRILOSEC) 40 MG capsule TAKE 1 CAPSULE (40 MG TOTAL) BY MOUTH DAILY. 90 capsule 1   rosuvastatin  (CRESTOR ) 40 MG tablet Take 1 tablet (40 mg total) by mouth daily. 90 tablet 3   tadalafil  (CIALIS ) 5 MG tablet Take 1 tablet (5 mg total) by mouth daily. 90 tablet 1   tirzepatide  (ZEPBOUND ) 2.5 MG/0.5ML Pen INJECT 2.5 MG SUBCUTANEOUSLY WEEKLY 6 mL 0   traMADol  (ULTRAM ) 50 MG tablet Take 1 tablet (50 mg total) by mouth every 12 (twelve) hours as needed. 180 tablet 1   No current facility-administered medications for this visit.    REVIEW OF SYSTEMS:   Constitutional: ( - ) fevers, ( - )  chills , ( - ) night sweats Eyes: ( - ) blurriness of vision, ( - ) double vision, ( - ) watery eyes Ears, nose, mouth, throat, and face: ( - ) mucositis, ( - ) sore throat Respiratory: ( - ) cough, ( - ) dyspnea, ( - ) wheezes Cardiovascular: ( - ) palpitation, ( - ) chest discomfort, ( - ) lower extremity swelling Gastrointestinal:  ( - ) nausea, ( - ) heartburn, ( - ) change in bowel habits Skin: ( - ) abnormal skin rashes Lymphatics: ( - ) new lymphadenopathy, ( - ) easy bruising Neurological: ( - ) numbness, ( - )  tingling, ( - ) new weaknesses Behavioral/Psych: ( - ) mood change, ( - ) new changes  All other systems were reviewed with the patient and are negative.  PHYSICAL EXAMINATION:  Vitals:   04/25/24 1400 04/25/24 1401  BP: (!) 148/98 (!) 146/100  Pulse: 84   Resp: 16   Temp: 98.1 F (36.7 C)   SpO2: 96%    Filed Weights   04/25/24 1400   Weight: 248 lb 4.8 oz (112.6 kg)    GENERAL: well appearing middle-age African-American male in NAD  SKIN: skin color, texture, turgor are normal, no rashes or significant lesions EYES: conjunctiva are pink and non-injected, sclera clear LUNGS: clear to auscultation and percussion with normal breathing effort HEART: regular rate & rhythm and no murmurs and no lower extremity edema Musculoskeletal: no cyanosis of digits and no clubbing  PSYCH: alert & oriented x 3, fluent speech NEURO: no focal motor/sensory deficits  LABORATORY DATA:  I have reviewed the data as listed    Latest Ref Rng & Units 04/25/2024    2:34 PM 10/06/2023   12:03 PM 07/30/2023    4:35 PM  CBC  WBC 4.0 - 10.5 K/uL 8.9  9.0  10.5   Hemoglobin 13.0 - 17.0 g/dL 86.5  85.6  86.5   Hematocrit 39.0 - 52.0 % 39.8  44.4  40.9   Platelets 150 - 400 K/uL 221  214  237.0        Latest Ref Rng & Units 04/25/2024    2:34 PM 12/31/2023   11:31 AM 10/06/2023   12:03 PM  CMP  Glucose 70 - 99 mg/dL 91  77  89   BUN 6 - 20 mg/dL 12  13  16    Creatinine 0.61 - 1.24 mg/dL 9.17  9.08  9.12   Sodium 135 - 145 mmol/L 140  142  141   Potassium 3.5 - 5.1 mmol/L 4.4  4.3  4.6   Chloride 98 - 111 mmol/L 104  103  105   CO2 22 - 32 mmol/L 28  22  26    Calcium  8.9 - 10.3 mg/dL 9.5  9.2  9.2   Total Protein 6.5 - 8.1 g/dL 7.4     Total Bilirubin 0.0 - 1.2 mg/dL 0.2     Alkaline Phos 38 - 126 U/L 56     AST 15 - 41 U/L 19     ALT 0 - 44 U/L 22        ASSESSMENT & PLAN TYKWON FERA 58 y.o. male with medical history significant for OSA, kidney stones, hypertension, coronary artery disease, and arthritis who presents for evaluation of prior DVT and PE.  After review of the labs, review of the records, and discussion with the patient the patients findings are most consistent with a prior provoked VTE.   A provoked venous thromboembolism (VTE) is one that has a clear inciting factor or event. Provoking factors include  prolonged travel/immobility, surgery (particularly abdominal or orthopedic), trauma,  and pregnancy/ estrogen containing birth control. This patient was reported to have a prior knee surgery, which would qualify as a transient provoking factor. As such we would recommend 3-6 months of anticoagulation therapy with consideration of additional therapy if symptoms persist.  # Provoked LLE DVT # Prior Provoked DVT/PE  --findings at this time are consistent with a provoked VTE  --will order baseline CMP and CBC to assure labs are adequate for DOAC therapy  --recommend the patient continue eliquis 5mg  BID  PO daily  --patient denies any bleeding, bruising, or dark stools on this medication. It is well tolerated. No difficulties accessing/affording the medication  --RTC in 6 months' time with strict return precautions for overt signs of bleeding.    Orders Placed This Encounter  Procedures   CBC with Differential (Cancer Center Only)    Standing Status:   Future    Number of Occurrences:   1    Expiration Date:   04/25/2025   CMP (Cancer Center only)    Standing Status:   Future    Number of Occurrences:   1    Expiration Date:   04/25/2025    All questions were answered. The patient knows to call the clinic with any problems, questions or concerns.  A total of more than 60 minutes were spent on this encounter with face-to-face time and non-face-to-face time, including preparing to see the patient, ordering tests and/or medications, counseling the patient and coordination of care as outlined above.   Norleen IVAR Kidney, MD Department of Hematology/Oncology [facility] at [facility] Phone: [phone] Pager: [phone] Email: [email or web address]  04/26/2024 3:36 PM

## 2024-06-01 ENCOUNTER — Ambulatory Visit: Payer: Self-pay

## 2024-06-01 ENCOUNTER — Ambulatory Visit (INDEPENDENT_AMBULATORY_CARE_PROVIDER_SITE_OTHER)

## 2024-06-01 ENCOUNTER — Encounter: Payer: Self-pay | Admitting: Internal Medicine

## 2024-06-01 ENCOUNTER — Ambulatory Visit: Admitting: Internal Medicine

## 2024-06-01 VITALS — Ht 67.0 in

## 2024-06-01 DIAGNOSIS — J069 Acute upper respiratory infection, unspecified: Secondary | ICD-10-CM

## 2024-06-01 DIAGNOSIS — I10 Essential (primary) hypertension: Secondary | ICD-10-CM

## 2024-06-01 DIAGNOSIS — J4521 Mild intermittent asthma with (acute) exacerbation: Secondary | ICD-10-CM

## 2024-06-01 DIAGNOSIS — D6851 Activated protein C resistance: Secondary | ICD-10-CM

## 2024-06-01 DIAGNOSIS — J45909 Unspecified asthma, uncomplicated: Secondary | ICD-10-CM | POA: Insufficient documentation

## 2024-06-01 MED ORDER — CEFDINIR 300 MG PO CAPS: 300.0000 mg | ORAL_CAPSULE | Freq: Two times a day (BID) | ORAL | 0 refills | Status: AC

## 2024-06-01 MED ORDER — ALBUTEROL SULFATE HFA 108 (90 BASE) MCG/ACT IN AERS: 2.0000 | INHALATION_SPRAY | RESPIRATORY_TRACT | 6 refills | Status: AC | PRN

## 2024-06-01 MED ORDER — HYDROCODONE BIT-HOMATROP MBR 5-1.5 MG/5ML PO SOLN: 5.0000 mL | Freq: Three times a day (TID) | ORAL | 0 refills | Status: AC | PRN

## 2024-06-01 NOTE — Assessment & Plan Note (Signed)
 Treat URI Proair  MDI Rx given - 2 puffs q 4 h prn CXR

## 2024-06-01 NOTE — Assessment & Plan Note (Signed)
 H/o DVT/PE - on Eliquis

## 2024-06-01 NOTE — Assessment & Plan Note (Signed)
 Elevated BP today was noted

## 2024-06-01 NOTE — Progress Notes (Signed)
 "  Subjective:  Patient ID: [name], male    DOB: Jan 27, 1966  Age: 58 y.o. MRN: [id number]  CC: Cough (Pt states he has had a cough x1 week and fever x 2 days... Pt states he had a bad nose bleed a few days ago and since been cough and sneezing up blood a/w yellow mucus... Pt complains of body aches, congestion and fatigue.)   HPI [name] presents for  URI - cough, weakness, SOB, bloody sputum, T 100 x 3 days. COVID (-) at home H/o asthma   Outpatient Medications Prior to Visit  Medication Sig Dispense Refill   ALPRAZolam  (XANAX ) 0.5 MG tablet Take 1 tablet (0.5 mg total) by mouth 2 (two) times daily as needed for anxiety. 180 tablet 0   Cholecalciferol  (VITAMIN D3) 50 MCG (2000 UT) capsule Take 2,000 Units by mouth daily.     ELIQUIS 5 MG TABS tablet Take 5 mg by mouth 2 (two) times daily.     fluticasone  (FLONASE ) 50 MCG/ACT nasal spray SPRAY 2 SPRAYS INTO EACH NOSTRIL EVERY DAY (Patient taking differently: as needed.) 16 mL 2   furosemide  (LASIX ) 20 MG tablet Take 1 tablet (20 mg total) by mouth daily. You can take an additional dose of Lasix  as needed for SOB, Lower extremity edema or a weight gain of 2-3 lbs overnight or 5 lbs in a week. 90 tablet 3   ibuprofen  (ADVIL ) 800 MG tablet TAKE 1 TABLET BY MOUTH 2 TIMES DAILY AS NEEDED. 60 tablet 3   modafinil  (PROVIGIL ) 200 MG tablet Take 1 tablet (200 mg total) by mouth daily. 30 tablet 5   Multiple Vitamin (MULTIVITAMIN WITH MINERALS) TABS tablet Take 1 tablet by mouth daily.     nitroGLYCERIN  (NITROSTAT ) 0.3 MG SL tablet PLACE 1 TABLET (0.3 MG TOTAL) UNDER THE TONGUE 2 (TWO) TIMES DAILY AS NEEDED. 25 tablet 2   omeprazole  (PRILOSEC) 40 MG capsule TAKE 1 CAPSULE (40 MG TOTAL) BY MOUTH DAILY. 90 capsule 1   rosuvastatin  (CRESTOR ) 40 MG tablet Take 1 tablet (40 mg total) by mouth daily. 90 tablet 3   tadalafil  (CIALIS ) 20 MG tablet Take 20 mg by mouth daily as needed.     tadalafil  (CIALIS ) 5 MG tablet Take 1 tablet (5 mg  total) by mouth daily. 90 tablet 1   tirzepatide  (ZEPBOUND ) 2.5 MG/0.5ML Pen INJECT 2.5 MG SUBCUTANEOUSLY WEEKLY 6 mL 0   traMADol  (ULTRAM ) 50 MG tablet Take 1 tablet (50 mg total) by mouth every 12 (twelve) hours as needed. 180 tablet 1   No facility-administered medications prior to visit.    ROS: Review of Systems  Constitutional:  Positive for chills and fatigue. Negative for appetite change and unexpected weight change.  HENT:  Negative for congestion, nosebleeds, sneezing, sore throat and trouble swallowing.   Eyes:  Negative for itching and visual disturbance.  Respiratory:  Positive for cough, shortness of breath and wheezing.   Cardiovascular:  Positive for chest pain. Negative for palpitations and leg swelling.  Gastrointestinal:  Negative for abdominal distention, blood in stool, diarrhea and nausea.  Genitourinary:  Negative for frequency and hematuria.  Musculoskeletal:  Positive for arthralgias and myalgias. Negative for back pain, gait problem, joint swelling and neck pain.  Skin:  Negative for rash.  Neurological:  Negative for dizziness, tremors, speech difficulty, weakness and light-headedness.  Psychiatric/Behavioral:  Negative for agitation, dysphoric mood and sleep disturbance. The patient is not nervous/anxious.     Objective:  Ht 5' 7 (  1.702 m)   BMI 38.89 kg/m   BP Readings from Last 3 Encounters:  04/25/24 (!) 146/100  04/01/24 130/80  12/31/23 135/86    Wt Readings from Last 3 Encounters:  04/25/24 248 lb 4.8 oz (112.6 kg)  04/01/24 246 lb (111.6 kg)  01/22/24 256 lb (116.1 kg)    Physical Exam Constitutional:      General: He is not in acute distress.    Appearance: Normal appearance. He is well-developed. He is ill-appearing. He is not toxic-appearing.     Comments: NAD  HENT:     Nose: Congestion and rhinorrhea present.     Mouth/Throat:     Pharynx: Posterior oropharyngeal erythema present. No oropharyngeal exudate.  Eyes:      Conjunctiva/sclera: Conjunctivae normal.     Pupils: Pupils are equal, round, and reactive to light.  Neck:     Thyroid : No thyromegaly.     Vascular: No JVD.  Cardiovascular:     Rate and Rhythm: Normal rate and regular rhythm.     Heart sounds: Normal heart sounds. No murmur heard.    No friction rub. No gallop.  Pulmonary:     Effort: Pulmonary effort is normal. No respiratory distress.     Breath sounds: Normal breath sounds. No wheezing or rales.  Chest:     Chest wall: No tenderness.  Abdominal:     General: Bowel sounds are normal. There is no distension.     Palpations: Abdomen is soft. There is no mass.     Tenderness: There is no abdominal tenderness. There is no guarding or rebound.  Musculoskeletal:        General: No tenderness. Normal range of motion.     Cervical back: Normal range of motion.  Lymphadenopathy:     Cervical: No cervical adenopathy.  Skin:    General: Skin is warm and dry.     Findings: No rash.  Neurological:     Mental Status: He is alert and oriented to person, place, and time.     Cranial Nerves: No cranial nerve deficit.     Motor: No abnormal muscle tone.     Coordination: Coordination normal.     Gait: Gait normal.     Deep Tendon Reflexes: Reflexes are normal and symmetric.  Psychiatric:        Behavior: Behavior normal.        Thought Content: Thought content normal.        Judgment: Judgment normal.   Looks tired  POC COVID (-), Flu A, B (-)  Lab Results  Component Value Date   WBC 8.9 04/25/2024   HGB 13.4 04/25/2024   HCT 39.8 04/25/2024   PLT 221 04/25/2024   GLUCOSE 91 04/25/2024   CHOL 120 03/25/2023   TRIG 58.0 03/25/2023   HDL 46.10 03/25/2023   LDLCALC 63 03/25/2023   ALT 22 04/25/2024   AST 19 04/25/2024   NA 140 04/25/2024   K 4.4 04/25/2024   CL 104 04/25/2024   CREATININE 0.82 04/25/2024   BUN 12 04/25/2024   CO2 28 04/25/2024   TSH 1.39 07/30/2023   PSA 0.39 07/30/2023   INR CANCELED 08/12/2013    HGBA1C 5.7 (H) 12/22/2019    MYOCARDIAL PERFUSION IMAGING Result Date: 12/03/2023   Exercise stress test:  Clinically and electrically negative for ischemia.   Exercise tolerance mildly impaired.   LV perfusion is normal. There is no evidence of ischemia. There is no evidence of infarction.   Coronary  calcium  was present on the attenuation correction CT images. Mild coronary calcifications were present. Coronary calcifications were present in the left anterior descending artery distribution(s).   Prior study available for comparison.  No change in perfusion   OVerall low risk study   MYOCARDIAL PERFUSION/CT RAD READ Result Date: 12/03/2023 CLINICAL DATA:  This over-read does not include interpretation of cardiac or coronary anatomy or pathology. The scintigraphy interpretation by the cardiologist is attached. COMPARISON:  09/29/2022 and previous FINDINGS: Vascular: Heart size normal. No pericardial effusion. Central great vessels grossly unremarkable. Mediastinum/Nodes: No mass or adenopathy. Lungs/Pleura: No pleural effusion. No pneumothorax. Mild bronchovascular crowding posteriorly at the lung bases. Upper Abdomen: Right upper pole renal cyst, slowly enlarging since 09/07/2017; no follow-up recommended. No acute findings. Musculoskeletal:   No acute findings IMPRESSION: 1. No acute findings. Electronically Signed   By: JONETTA Faes M.D.   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    Dispense:  1 each    Refill:  6   cefdinir (OMNICEF) 300 MG capsule    Sig: Take 1 capsule (300 mg total) by mouth 2 (two) times daily.    Dispense:  20 capsule    Refill:  0   HYDROcodone  bit-homatropine (HYCODAN) 5-1.5 MG/5ML syrup    Sig: Take 5 mLs by mouth every 8 (eight) hours as needed.    Dispense:  240 mL    Refill:  0      Follow-up: No follow-ups on file.  Marolyn Noel, MD "

## 2024-06-01 NOTE — Telephone Encounter (Signed)
 FYI Only or Action Required?: Action required by provider: request for appointment.  Patient was last seen in primary care on 12/03/2023 by Joshua Debby CROME, MD.  Called Nurse Triage reporting Cough.  Symptoms began several days ago.  Interventions attempted: Rest, hydration, or home remedies.  Symptoms are: gradually worsening.Cough with yellow, bloody mucus. Fever, body aches. Getting worse.  Triage Disposition: See Physician Within 24 Hours  Patient/caregiver understands and will follow disposition?: Yes     Copied from CRM #[id number]. Topic: Clinical - Red Word Triage >> Jun 01, 2024  8:08 AM Mia F wrote: Red Word that prompted transfer to Nurse Triage: Pt says he started with a cough that started about a week ago. He is coughing up blood and his nose will run and bleed as well. He says now within the last 2-3 days he has developed cold and flu symptoms. He says he has been having body aches, fever of 101.0, nausea, fatigue and some dizziness. Reason for Disposition  [1] Continuous (nonstop) coughing interferes with work or school AND [2] no improvement using cough treatment per Care Advice  Answer Assessment - Initial Assessment Questions 1. ONSET: When did the cough begin?      Several days 2. SEVERITY: How bad is the cough today?      severe 3. SPUTUM: Describe the color of your sputum (e.g., none, dry cough; clear, white, yellow, green)     yellow 4. HEMOPTYSIS: Are you coughing up any blood? If Yes, ask: How much? (e.g., flecks, streaks, tablespoons, etc.)     yes 5. DIFFICULTY BREATHING: Are you having difficulty breathing? If Yes, ask: How bad is it? (e.g., mild, moderate, severe)      no 6. FEVER: Do you have a fever? If Yes, ask: What is your temperature, how was it measured, and when did it start?     100 7. CARDIAC HISTORY: Do you have any history of heart disease? (e.g., heart attack, congestive heart failure)      no 8. LUNG HISTORY: Do you  have any history of lung disease?  (e.g., pulmonary embolus, asthma, emphysema)     asthma 9. PE RISK FACTORS: Do you have a history of blood clots? (or: recent major surgery, recent prolonged travel, bedridden)     no 10. OTHER SYMPTOMS: Do you have any other symptoms? (e.g., runny nose, wheezing, chest pain)       Body aches 11. PREGNANCY: Is there any chance you are pregnant? When was your last menstrual period?       N/a 12. TRAVEL: Have you traveled out of the country in the last month? (e.g., travel history, exposures)       no  Protocols used: Cough - Acute Productive-A-AH

## 2024-06-02 ENCOUNTER — Ambulatory Visit: Payer: Self-pay | Admitting: Internal Medicine

## 2024-06-06 ENCOUNTER — Other Ambulatory Visit: Payer: Self-pay | Admitting: Internal Medicine

## 2024-06-06 ENCOUNTER — Ambulatory Visit: Admitting: Internal Medicine

## 2024-06-06 ENCOUNTER — Encounter: Payer: Self-pay | Admitting: Internal Medicine

## 2024-06-06 VITALS — BP 138/86 | HR 82 | Temp 98.4°F | Resp 16 | Ht 67.0 in | Wt 245.6 lb

## 2024-06-06 DIAGNOSIS — Z23 Encounter for immunization: Secondary | ICD-10-CM

## 2024-06-06 DIAGNOSIS — G4733 Obstructive sleep apnea (adult) (pediatric): Secondary | ICD-10-CM

## 2024-06-06 DIAGNOSIS — R079 Chest pain, unspecified: Secondary | ICD-10-CM | POA: Diagnosis not present

## 2024-06-06 DIAGNOSIS — E785 Hyperlipidemia, unspecified: Secondary | ICD-10-CM | POA: Diagnosis not present

## 2024-06-06 LAB — LIPID PANEL
Cholesterol: 108 mg/dL (ref 28–200)
HDL: 37.5 mg/dL — ABNORMAL LOW
LDL Cholesterol: 61 mg/dL (ref 10–99)
NonHDL: 70.84
Total CHOL/HDL Ratio: 3
Triglycerides: 49 mg/dL (ref 10.0–149.0)
VLDL: 9.8 mg/dL (ref 0.0–40.0)

## 2024-06-06 LAB — TROPONIN I (HIGH SENSITIVITY): High Sens Troponin I: 3 ng/L (ref 2–17)

## 2024-06-06 MED ORDER — ZEPBOUND 5 MG/0.5ML ~~LOC~~ SOAJ: 5.0000 mg | SUBCUTANEOUS | 0 refills | Status: AC

## 2024-06-06 NOTE — Patient Instructions (Signed)
Nonspecific Chest Pain, Adult Chest pain is an uncomfortable, tight, or painful feeling in the chest. The pain can feel like a crushing, aching, or squeezing pressure. A person can feel a burning or tingling sensation. Chest pain can also be felt in your back, neck, jaw, shoulder, or arm. This pain can be worse when you move, sneeze, or take a deep breath. Chest pain can be caused by a condition that is life-threatening. This must be treated right away. It can also be caused by something that is not life-threatening. If you have chest pain, it can be hard to know the difference, so it is important to get help right away to make sure that you do not have a serious condition. Some life-threatening causes of chest pain include: Heart attack. A tear in the body's main blood vessel (aortic dissection). Inflammation around your heart (pericarditis). A problem in the lungs, such as a blood clot (pulmonary embolism) or a collapsed lung (pneumothorax). Some non life-threatening causes of chest pain include: Heartburn. Anxiety or stress. Damage to the bones, muscles, and cartilage that make up your chest wall. Pneumonia or bronchitis. Shingles infection (varicella-zoster virus). Your chest pain may come and go. It may also be constant. Your health care provider will do tests and other studies to find the cause of your pain. Treatment will depend on the cause of your chest pain. Follow these instructions at home: Medicines Take over-the-counter and prescription medicines only as told by your health care provider. If you were prescribed an antibiotic medicine, take it as told by your health care provider. Do not stop taking the antibiotic even if you start to feel better. Activity Avoid any activities that cause chest pain. Do not lift anything that is heavier than 10 lb (4.5 kg), or the limit that you are told, until your health care provider says that it is safe. Rest as directed by your health care  provider. Return to your normal activities only as told by your health care provider. Ask your health care provider what activities are safe for you. Lifestyle     Do not use any products that contain nicotine or tobacco, such as cigarettes, e-cigarettes, and chewing tobacco. If you need help quitting, ask your health care provider. Do not drink alcohol. Make healthy lifestyle changes as recommended. These may include: Getting regular exercise. Ask your health care provider to suggest some exercises that are safe for you. Eating a heart-healthy diet. This includes plenty of fresh fruits and vegetables, whole grains, low-fat (lean) protein, and low-fat dairy products. A dietitian can help you find healthy eating options. Maintaining a healthy weight. Managing any other health conditions you may have, such as high blood pressure (hypertension) or diabetes. Reducing stress, such as with yoga or relaxation techniques. General instructions Pay attention to any changes in your symptoms. It is up to you to get the results of any tests that were done. Ask your health care provider, or the department that is doing the tests, when your results will be ready. Keep all follow-up visits as told by your health care provider. This is important. You may be asked to go for further testing if your chest pain does not go away. Contact a health care provider if: Your chest pain does not go away. You feel depressed. You have a fever. You notice changes in your symptoms or develop new symptoms. Get help right away if: Your chest pain gets worse. You have a cough that gets worse, or you  cough up blood. You have severe pain in your abdomen. You faint. You have sudden, unexplained chest discomfort. You have sudden, unexplained discomfort in your arms, back, neck, or jaw. You have shortness of breath at any time. You suddenly start to sweat, or your skin gets clammy. You feel nausea or you vomit. You  suddenly feel lightheaded or dizzy. You have severe weakness, or unexplained weakness or fatigue. Your heart begins to beat quickly, or it feels like it is skipping beats. These symptoms may represent a serious problem that is an emergency. Do not wait to see if the symptoms will go away. Get medical help right away. Call your local emergency services (911 in the [place] Do not drive yourself to the hospital. Summary Chest pain can be caused by a condition that is serious and requires urgent treatment. It may also be caused by something that is not life-threatening. Your health care provider may do lab tests and other studies to find the cause of your pain. Follow your health care provider's instructions on taking medicines, making lifestyle changes, and getting emergency treatment if symptoms become worse. Keep all follow-up visits as told by your health care provider. This includes visits for any further testing if your chest pain does not go away. This information is not intended to replace advice given to you by your health care provider. Make sure you discuss any questions you have with your health care provider. Document Revised: 04/03/2022 Document Reviewed: 04/03/2022 Elsevier Patient Education  2024 [employer].

## 2024-06-06 NOTE — Progress Notes (Signed)
 "  Subjective:  Patient ID: [name], male    DOB: 1965-08-04  Age: 59 y.o. MRN: [id number]  CC: Hypertension (6 month follow up )   HPI [name] presents for f/up ---  Discussed the use of AI scribe software for clinical note transcription with the patient, who gave verbal consent to proceed.  History of Present Illness [name] is a 59 year old male who presents with persistent respiratory symptoms and recent chest pain.  He was diagnosed with pneumonia five days ago and has experienced some improvement. However, he continues to have difficulty breathing, coughing, and occasional wheezing. The cough is improving but still present. He sometimes feels cold and hot, with occasional fever and chills.  Yesterday, he experienced sharp chest pain described as 'like caving in' while lying down. Nitroglycerin  alleviated the pain, but it returned an hour later before resolving completely. The patient did not experience cold sweats during the chest pain episode.  He is currently taking Eliquis for blood clots and reports no changes in weight or appetite. He is on zepbound , which he has been taking for three to four months without side effects such as nausea, vomiting, abdominal pain, diarrhea, or cramps. He also takes a cough medicine containing codeine, which causes him to feel hot.  ROS: Occasional fever and chills, shortness of breath that comes and goes. No cold sweats during chest pain episodes. No nausea, vomiting, abdominal pain, diarrhea, or cramps.     Outpatient Medications Prior to Visit  Medication Sig Dispense Refill   ALPRAZolam  (XANAX ) 0.5 MG tablet Take 1 tablet (0.5 mg total) by mouth 2 (two) times daily as needed for anxiety. 180 tablet 0   Cholecalciferol  (VITAMIN D3) 50 MCG (2000 UT) capsule Take 2,000 Units by mouth daily.     ELIQUIS 5 MG TABS tablet Take 5 mg by mouth 2 (two) times daily.     fluticasone  (FLONASE ) 50 MCG/ACT nasal spray SPRAY 2  SPRAYS INTO EACH NOSTRIL EVERY DAY (Patient taking differently: as needed.) 16 mL 2   furosemide  (LASIX ) 20 MG tablet Take 1 tablet (20 mg total) by mouth daily. You can take an additional dose of Lasix  as needed for SOB, Lower extremity edema or a weight gain of 2-3 lbs overnight or 5 lbs in a week. 90 tablet 3   ibuprofen  (ADVIL ) 800 MG tablet TAKE 1 TABLET BY MOUTH 2 TIMES DAILY AS NEEDED. 60 tablet 3   modafinil  (PROVIGIL ) 200 MG tablet Take 1 tablet (200 mg total) by mouth daily. 30 tablet 5   Multiple Vitamin (MULTIVITAMIN WITH MINERALS) TABS tablet Take 1 tablet by mouth daily.     nitroGLYCERIN  (NITROSTAT ) 0.3 MG SL tablet PLACE 1 TABLET (0.3 MG TOTAL) UNDER THE TONGUE 2 (TWO) TIMES DAILY AS NEEDED. 25 tablet 2   omeprazole  (PRILOSEC) 40 MG capsule TAKE 1 CAPSULE (40 MG TOTAL) BY MOUTH DAILY. 90 capsule 1   rosuvastatin  (CRESTOR ) 40 MG tablet Take 1 tablet (40 mg total) by mouth daily. 90 tablet 3   traMADol  (ULTRAM ) 50 MG tablet Take 1 tablet (50 mg total) by mouth every 12 (twelve) hours as needed. 180 tablet 1   tadalafil  (CIALIS ) 5 MG tablet Take 1 tablet (5 mg total) by mouth daily. 90 tablet 1   tirzepatide  (ZEPBOUND ) 2.5 MG/0.5ML Pen INJECT 2.5 MG SUBCUTANEOUSLY WEEKLY 6 mL 0   albuterol  (PROAIR  HFA) 108 (90 Base) MCG/ACT inhaler Inhale 2 puffs into the lungs every 4 (four) hours as  needed for wheezing or shortness of breath. 1 each 6   cefdinir  (OMNICEF ) 300 MG capsule Take 1 capsule (300 mg total) by mouth 2 (two) times daily. 20 capsule 0   HYDROcodone  bit-homatropine (HYCODAN) 5-1.5 MG/5ML syrup Take 5 mLs by mouth every 8 (eight) hours as needed. 240 mL 0   tadalafil  (CIALIS ) 20 MG tablet Take 20 mg by mouth daily as needed.     No facility-administered medications prior to visit.    ROS Review of Systems  Constitutional:  Positive for chills and fever. Negative for appetite change, diaphoresis, fatigue and unexpected weight change.  HENT: Negative.  Negative for facial  swelling, sinus pressure, sore throat and trouble swallowing.   Eyes: Negative.  Negative for visual disturbance.  Respiratory:  Positive for apnea, cough and wheezing. Negative for choking and shortness of breath.   Cardiovascular:  Positive for chest pain. Negative for palpitations and leg swelling.  Gastrointestinal:  Negative for abdominal pain, constipation, diarrhea, nausea and vomiting.  Endocrine: Negative.   Genitourinary:  Negative for difficulty urinating and dysuria.  Musculoskeletal:  Positive for arthralgias and back pain. Negative for myalgias.  Skin: Negative.  Negative for color change.  Neurological: Negative.  Negative for dizziness, weakness and light-headedness.  Hematological:  Negative for adenopathy. Does not bruise/bleed easily.  Psychiatric/Behavioral: Negative.      Objective:  BP 138/86 (BP Location: Left Arm, Patient Position: Sitting, Cuff Size: Normal)   Pulse 82   Temp 98.4 F (36.9 C) (Oral)   Resp 16   Ht 5' 7 (1.702 m)   Wt 245 lb 9.6 oz (111.4 kg)   SpO2 92%   BMI 38.47 kg/m   BP Readings from Last 3 Encounters:  06/06/24 138/86  04/25/24 (!) 146/100  04/01/24 130/80    Wt Readings from Last 3 Encounters:  06/06/24 245 lb 9.6 oz (111.4 kg)  04/25/24 248 lb 4.8 oz (112.6 kg)  04/01/24 246 lb (111.6 kg)    Physical Exam Vitals reviewed.  Constitutional:      General: He is not in acute distress.    Appearance: He is not ill-appearing, toxic-appearing or diaphoretic.  HENT:     Mouth/Throat:     Mouth: Mucous membranes are moist.  Eyes:     General: No scleral icterus.    Conjunctiva/sclera: Conjunctivae normal.  Cardiovascular:     Rate and Rhythm: Normal rate and regular rhythm.     Heart sounds: No murmur heard.    No friction rub. No gallop.     Comments: EKG--- NSR, 75 bpm No LVH, Q waves, or ST/T wave changes  Improved  Pulmonary:     Effort: Pulmonary effort is normal.     Breath sounds: No stridor. Examination of  the right-lower field reveals rhonchi. Examination of the left-lower field reveals rhonchi and rales. Rhonchi and rales present. No decreased breath sounds or wheezing.  Chest:     Chest wall: No tenderness.  Abdominal:     General: Abdomen is flat.     Palpations: There is no mass.     Tenderness: There is no abdominal tenderness. There is no guarding.     Hernia: No hernia is present.  Musculoskeletal:        General: Normal range of motion.     Cervical back: Neck supple.     Right lower leg: No edema.     Left lower leg: No edema.  Lymphadenopathy:     Cervical: No cervical adenopathy.  Skin:  General: Skin is warm and dry.     Coloration: Skin is not pale.     Findings: No rash.  Neurological:     General: No focal deficit present.     Mental Status: He is alert. Mental status is at baseline.  Psychiatric:        Mood and Affect: Mood normal.        Behavior: Behavior normal.     Lab Results  Component Value Date   WBC 8.9 04/25/2024   HGB 13.4 04/25/2024   HCT 39.8 04/25/2024   PLT 221 04/25/2024   GLUCOSE 91 04/25/2024   CHOL 108 06/06/2024   TRIG 49.0 06/06/2024   HDL 37.50 (L) 06/06/2024   LDLCALC 61 06/06/2024   ALT 22 04/25/2024   AST 19 04/25/2024   NA 140 04/25/2024   K 4.4 04/25/2024   CL 104 04/25/2024   CREATININE 0.82 04/25/2024   BUN 12 04/25/2024   CO2 28 04/25/2024   TSH 1.39 07/30/2023   PSA 0.39 07/30/2023   INR CANCELED 08/12/2013   HGBA1C 5.7 (H) 12/22/2019    MYOCARDIAL PERFUSION IMAGING Result Date: 12/03/2023   Exercise stress test:  Clinically and electrically negative for ischemia.   Exercise tolerance mildly impaired.   LV perfusion is normal. There is no evidence of ischemia. There is no evidence of infarction.   Coronary calcium  was present on the attenuation correction CT images. Mild coronary calcifications were present. Coronary calcifications were present in the left anterior descending artery distribution(s).   Prior study  available for comparison.  No change in perfusion   OVerall low risk study   MYOCARDIAL PERFUSION/CT RAD READ Result Date: 12/03/2023 CLINICAL DATA:  This over-read does not include interpretation of cardiac or coronary anatomy or pathology. The scintigraphy interpretation by the cardiologist is attached. COMPARISON:  09/29/2022 and previous FINDINGS: Vascular: Heart size normal. No pericardial effusion. Central great vessels grossly unremarkable. Mediastinum/Nodes: No mass or adenopathy. Lungs/Pleura: No pleural effusion. No pneumothorax. Mild bronchovascular crowding posteriorly at the lung bases. Upper Abdomen: Right upper pole renal cyst, slowly enlarging since 09/07/2017; no follow-up recommended. No acute findings. Musculoskeletal:   No acute findings IMPRESSION: 1. No acute findings. Electronically Signed   By: JONETTA Faes M.D.   On: 12/03/2023 14:00    DG Chest 2 View Result Date: 06/01/2024 CLINICAL DATA:  Cough and shortness of breath. Upper respiratory infection. EXAM: CHEST - 2 VIEW COMPARISON:  09/04/2020 FINDINGS: The cardiomediastinal contours are normal. Ill-defined patchy opacity in the left infrahilar lung. Pulmonary vasculature is normal. No pleural effusion or pneumothorax. No acute osseous abnormalities are seen. IMPRESSION: Ill-defined patchy opacity in the left infrahilar lung, suspicious for pneumonia. Electronically Signed   By: Andrea Gasman M.D.   On: 06/01/2024 09:50     The ASCVD Risk score (Arnett DK, et al., 2019) failed to calculate for the following reasons:   The valid total cholesterol range is 130 to 320 mg/dL   Assessment & Plan:   Chest pain, unspecified type- CP is atypical. EKG is normal. -     EKG 12-Lead -     Troponin I (High Sensitivity); Future  Need for immunization against influenza -     Flu vaccine trivalent PF, 6mos and older(Flulaval,Afluria,Fluarix,Fluzone)  Dyslipidemia, goal LDL below 100- LDL goal achieved. Doing well on the statin  -      Lipid panel; Future  OSA (obstructive sleep apnea) -     Zepbound ; Inject 5 mg into the  skin once a week.  Dispense: 2 mL; Refill: 0     Follow-up: Return in about 2 months (around 08/04/2024).  Debby Molt, MD "

## 2024-06-07 ENCOUNTER — Telehealth: Payer: Self-pay

## 2024-06-07 ENCOUNTER — Ambulatory Visit: Payer: Self-pay | Admitting: Internal Medicine

## 2024-06-07 NOTE — Telephone Encounter (Signed)
 When will he go back to work?

## 2024-06-07 NOTE — Telephone Encounter (Signed)
 Copied from CRM #[id number]. Topic: General - Other >> Jun 06, 2024  4:49 PM Alfonso ORN wrote: Reason for CRM: pt needs note to return to work for pneumonia via mychart following appt with plotnikov. please advise

## 2024-06-07 NOTE — Progress Notes (Signed)
 " [name] [address] [place] [address] Phone: [phone] Subjective:   [name], am serving as a scribe for Dr. Arthea Claudene.  I'm seeing this patient by the request  of:  [name] Debby CROME, MD  CC: Bilateral knee pain  YEP:Dlagzrupcz  04/01/2024 Will repeat injection given April 01, 2024.  Responded well to the injections.  Unfortunately patient recently had another clot likely secondary to his factor V Leiden mutation.  Patient will be on Eliquis long-term now.  Patient will not be able to do any anti-inflammatories.  Follow-up again 10 to 12 weeks otherwise.     Updated 06/10/2024 [name] is a 59 y.o. male coming in with complaint of B knee pain Severe OA of the knees. Thinking about surgery arthritic changes noted bilaterally.  Patient and will maybe consider the replacement sometime this year but at the moment not a good time.      Past Medical History:  Diagnosis Date   Anxiety    Arthritis    knees - no med   Cluster headache    Hx - resolved per patient   Coronary artery disease    30% dLAD, 30% pLCX 10/17/22   Depression    DVT of lower extremity (deep venous thrombosis) (HCC) 6/26-28/2013   Xarelto  - resolved - history   History of kidney stones    HTN (hypertension)    currently no meds   Irritability and anger    PMH of   Kidney stone    passed stone, no surgery required.   Mild intermittent asthma without complication 11/13/2020   Rotator cuff arthropathy 2007   right   Saddle pulmonary embolus (HCC) 6/26-28/2013   post orthopedic surgery - resolved   Sleep apnea 09/2013   moderate OSA-has cpap but does not use it   Past Surgical History:  Procedure Laterality Date   arthroscopic knee surgery  10/09/2011   Dr Vernetta   CARDIAC CATHETERIZATION  06/02/2005   LVdysfunction; normal coronaries   COLONOSCOPY     EXTRACORPOREAL SHOCK WAVE LITHOTRIPSY Left 09/05/2022   Procedure: EXTRACORPOREAL  SHOCK WAVE LITHOTRIPSY (ESWL);  Surgeon: Nieves Cough, MD;  Location: [facility];  Service: Urology;  Laterality: Left;   EXTRACORPOREAL SHOCK WAVE LITHOTRIPSY Left 11/21/2022   Procedure: EXTRACORPOREAL SHOCK WAVE LITHOTRIPSY (ESWL);  Surgeon: Nieves Cough, MD;  Location[facility];  Service: Urology;  Laterality: Left;   KNEE ARTHROSCOPY WITH MEDIAL MENISECTOMY Left 09/20/2014   medial menisectomy chondroplastytella medial plica excision  ;  Surgeon: Norleen Gavel, MD;  Location: [facility];  Service: Orthopedics;  Laterality: Left;   LEFT HEART CATH AND CORONARY ANGIOGRAPHY N/A 10/17/2022   Procedure: LEFT HEART CATH AND CORONARY ANGIOGRAPHY;  Surgeon: Wendel Lurena POUR, MD;  Location: [facility];  Service: Cardiovascular;  Laterality: N/A;   ROTATOR CUFF REPAIR  06/02/2005   right   UMBILICAL HERNIA REPAIR N/A 10/06/2023   Procedure: REPAIR, HERNIA, UMBILICAL, ADULT;  Surgeon: Vanderbilt Debby, MD;  Location: [facility];  Service: General;  Laterality: N/A;  WITH MESH   UPPER GASTROINTESTINAL ENDOSCOPY  06/02/2005   normal   WISDOM TOOTH EXTRACTION     Social History   Socioeconomic History   Marital status: Married    Spouse name: Not on file   Number of children: 6   Years of education: 11   Highest education level: Not on file  Occupational History  Occupation: Location manager    Occupation: Lobbyist  Tobacco Use   Smoking status: Former    Types: Cigars    Quit date: 11/24/2023    Years since quitting: 0.5    Passive exposure: Never   Smokeless tobacco: Never   Tobacco comments:    every 3-4 months  Vaping Use   Vaping status: Never Used  Substance and Sexual Activity   Alcohol use: Not Currently   Drug use: No   Sexual activity: Yes    Partners: Female    Birth control/protection: Condom  Other Topics Concern   Not on file  Social History Narrative   Fun: Exercise   Social Drivers of Health    Tobacco Use: Medium Risk (06/06/2024)   Patient History    Smoking Tobacco Use: Former    Smokeless Tobacco Use: Never    Passive Exposure: Never  Programmer, Applications: Not on file  Food Insecurity: Not on file  Transportation Needs: Not on file  Physical Activity: Not on file  Stress: Not on file  Social Connections: Unknown (10/11/2021)   Received from [facility]   Social Network    Social Network: Not on file  Depression (PHQ2-9): Low Risk (04/25/2024)   Depression (PHQ2-9)    PHQ-2 Score: 2  Alcohol Screen: Not on file  Housing: Unknown (08/17/2023)   Received from [facility]   Epic    Unable to Pay for Housing in the Last Year: Not on file    Number of Times Moved in the Last Year: Not on file    At any time in the past 12 months, were you homeless or living in a shelter (including now)?: No  Utilities: Not on file  Health Literacy: Not on file   Allergies[1] Family History  Problem Relation Age of Onset   Cancer Mother        Mesothelioma    Diabetes Brother    Hypertension Brother    Stroke Brother 50   Heart attack Brother 55   Diabetes Maternal Aunt    Diabetes Maternal Grandmother    Heart disease Maternal Grandmother    Hypertension Maternal Grandmother    Stroke Maternal Grandmother        in 59s   Deep vein thrombosis Maternal Grandmother    Stroke Maternal Grandfather        > 55   Migraines Brother    Heart attack Brother 60   Healthy Father    Colon cancer Neg Hx    Esophageal cancer Neg Hx    Rectal cancer Neg Hx    Stomach cancer Neg Hx    Liver cancer Neg Hx    Pancreatic cancer Neg Hx    Other Neg Hx        low testosterone     Current Outpatient Medications (Cardiovascular):    furosemide  (LASIX ) 20 MG tablet, Take 1 tablet (20 mg total) by mouth daily. You can take an additional dose of Lasix  as needed for SOB, Lower extremity edema or a weight gain of 2-3 lbs overnight or 5 lbs in a week.   nitroGLYCERIN   (NITROSTAT ) 0.3 MG SL tablet, PLACE 1 TABLET (0.3 MG TOTAL) UNDER THE TONGUE 2 (TWO) TIMES DAILY AS NEEDED.   rosuvastatin  (CRESTOR ) 40 MG tablet, Take 1 tablet (40 mg total) by mouth daily.   tadalafil  (CIALIS ) 20 MG tablet, Take 20 mg by mouth daily as needed.  Current Outpatient Medications (Respiratory):    albuterol  (  PROAIR  HFA) 108 (90 Base) MCG/ACT inhaler, Inhale 2 puffs into the lungs every 4 (four) hours as needed for wheezing or shortness of breath.   fluticasone  (FLONASE ) 50 MCG/ACT nasal spray, SPRAY 2 SPRAYS INTO EACH NOSTRIL EVERY DAY (Patient taking differently: as needed.)   HYDROcodone  bit-homatropine (HYCODAN) 5-1.5 MG/5ML syrup, Take 5 mLs by mouth every 8 (eight) hours as needed.  Current Outpatient Medications (Analgesics):    ibuprofen  (ADVIL ) 800 MG tablet, TAKE 1 TABLET BY MOUTH 2 TIMES DAILY AS NEEDED.   traMADol  (ULTRAM ) 50 MG tablet, Take 1 tablet (50 mg total) by mouth every 12 (twelve) hours as needed.  Current Outpatient Medications (Hematological):    ELIQUIS 5 MG TABS tablet, Take 5 mg by mouth 2 (two) times daily.  Current Outpatient Medications (Other):    ALPRAZolam  (XANAX ) 0.5 MG tablet, Take 1 tablet (0.5 mg total) by mouth 2 (two) times daily as needed for anxiety.   cefdinir  (OMNICEF ) 300 MG capsule, Take 1 capsule (300 mg total) by mouth 2 (two) times daily.   Cholecalciferol  (VITAMIN D3) 50 MCG (2000 UT) capsule, Take 2,000 Units by mouth daily.   modafinil  (PROVIGIL ) 200 MG tablet, Take 1 tablet (200 mg total) by mouth daily.   Multiple Vitamin (MULTIVITAMIN WITH MINERALS) TABS tablet, Take 1 tablet by mouth daily.   omeprazole  (PRILOSEC) 40 MG capsule, TAKE 1 CAPSULE (40 MG TOTAL) BY MOUTH DAILY.   tirzepatide  (ZEPBOUND ) 5 MG/0.5ML Pen, Inject 5 mg into the skin once a week.   Reviewed prior external information including notes and imaging from  primary care provider As well as notes that were available from care everywhere and other healthcare  systems.  Past medical history, social, surgical and family history all reviewed in electronic medical record.  No pertanent information unless stated regarding to the chief complaint.   Review of Systems:  No headache, visual changes, nausea, vomiting, diarrhea, constipation, dizziness, abdominal pain, skin rash, fevers, chills, night sweats, weight loss, swollen lymph nodes, body aches, joint swelling, chest pain, shortness of breath, mood changes. POSITIVE muscle aches  Objective  Blood pressure 118/86, pulse 88, height 5' 7 (1.702 m), weight 246 lb (111.6 kg), SpO2 97%.   General: No apparent distress alert and oriented x3 mood and affect normal, dressed appropriately.  HEENT: Pupils equal, extraocular movements intact  Respiratory: Patient's speak in full sentences and does not appear short of breath  Cardiovascular: No lower extremity edema, non tender, no erythema  Antalgic gait noted.  Patient's knees do have trace effusion noted bilaterally.  Crepitus noted of the knees.  Instability with valgus and varus force.  After informed written and verbal consent, patient was seated on exam table. Right knee was prepped with alcohol swab and utilizing anterolateral approach, patient's right knee space was injected with 4:1  marcaine  0.5%: Kenalog  40mg /dL. Patient tolerated the procedure well without immediate complications.  After informed written and verbal consent, patient was seated on exam table. Left knee was prepped with alcohol swab and utilizing anterolateral approach, patient's left knee space was injected with 4:1  marcaine  0.5%: Kenalog  40mg /dL. Patient tolerated the procedure well without immediate complications.    Impression and Recommendations:    The above documentation has been reviewed and is accurate and complete Cowen Pesqueira M Kaylanni Ezelle, DO        [1] No Known Allergies  "

## 2024-06-07 NOTE — Telephone Encounter (Signed)
 Deleted the original CRM in error.  Copied from CRM #[id number]. Topic: General - Other >> Jun 06, 2024  4:49 PM Alfonso ORN wrote: Reason for CRM: pt needs note to return to work for pneumonia via mychart following appt with plotnikov. please advise

## 2024-06-07 NOTE — Telephone Encounter (Signed)
 [employer] benefits ran for bilateral knee case ID [id number]

## 2024-06-08 NOTE — Telephone Encounter (Signed)
 I gave the patient the note Dr. SHAUNNA had in his chart when he was in the office to see Dr. Joshua

## 2024-06-09 NOTE — Telephone Encounter (Signed)
 Had to fill out pre cert form. Faxed to [employer]

## 2024-06-10 ENCOUNTER — Telehealth: Payer: Self-pay

## 2024-06-10 ENCOUNTER — Other Ambulatory Visit (HOSPITAL_COMMUNITY): Payer: Self-pay

## 2024-06-10 ENCOUNTER — Encounter: Payer: Self-pay | Admitting: Family Medicine

## 2024-06-10 ENCOUNTER — Ambulatory Visit: Admitting: Family Medicine

## 2024-06-10 VITALS — BP 118/86 | HR 88 | Ht 67.0 in | Wt 246.0 lb

## 2024-06-10 DIAGNOSIS — M170 Bilateral primary osteoarthritis of knee: Secondary | ICD-10-CM

## 2024-06-10 NOTE — Telephone Encounter (Signed)
 [employer] authorized bilateral knee PRE CERT REQUIRED Patient responsible for 20% coinsurance Deductible $2686.41 has met $0 OOP MAX $5000 has met $0 Once deductible met patient responsible for coinsurance Once te OOP is met coverage goes to 100% AUTHORIZATION # [id number] 06/09/24-06/09/25

## 2024-06-10 NOTE — Telephone Encounter (Signed)
 Copied from CRM #[id number]. Topic: Clinical - Medication Prior Auth >> Jun 10, 2024 12:13 PM Amy B wrote: Reason for CRM: Patient states Zepbound  requires prior authorization.  Please initiate prior auth.

## 2024-06-10 NOTE — Patient Instructions (Signed)
Injections in knees today Good to see you! See you again in 10-12 weeks

## 2024-06-10 NOTE — Telephone Encounter (Signed)
 Pharmacy Patient Advocate Encounter   Received notification from Physician's Office that prior authorization for Zepbound  5MG /0.5ML pen-injectors is required/requested.   Insurance verification completed.   The patient is insured through [facility].   Per test claim: PA required; PA started via CoverMyMeds. KEY [id number] . Waiting for clinical questions to populate.

## 2024-06-13 ENCOUNTER — Other Ambulatory Visit: Payer: Self-pay | Admitting: Family Medicine

## 2024-06-16 NOTE — Telephone Encounter (Signed)
 Pharmacy Patient Advocate Encounter  Received notification from [employer] that Prior Authorization for  Zepbound  5MG /0.5ML pen-injectors has been DENIED.  Full denial letter will be uploaded to the media tab. See denial reason below.  Requires maintenance dose of 10mg , 12.5mg , or 15mg  weekly, lifestyle changes with reduced calorie diet and exercise, and hemoglobin A1c less than 6.5%.    PA #/Case ID/Reference #: [id number]

## 2024-06-17 ENCOUNTER — Telehealth: Payer: Self-pay

## 2024-06-17 NOTE — Telephone Encounter (Signed)
 Copied from CRM [id number]. Topic: General - Other >> Jun 17, 2024 12:53 PM Brittany [name] wrote: Reason for CRM: Patient asking for [place] , [name], CMA to return call

## 2024-06-17 NOTE — Telephone Encounter (Signed)
 Patient has been made aware and gave a verbal understanding.

## 2024-06-20 ENCOUNTER — Other Ambulatory Visit: Payer: Self-pay | Admitting: Internal Medicine

## 2024-06-20 DIAGNOSIS — G4733 Obstructive sleep apnea (adult) (pediatric): Secondary | ICD-10-CM

## 2024-06-20 NOTE — Telephone Encounter (Unsigned)
 Copied from CRM #[id number]. Topic: Clinical - Medication Question >> Jun 20, 2024  3:34 PM Drema MATSU wrote: Reason for CRM: Pt is requesting a callback from Jasmine in regards to ZEPBOUND  medication.

## 2024-06-22 NOTE — Telephone Encounter (Signed)
 Unable to reach patient lmtrc

## 2024-06-24 NOTE — Telephone Encounter (Signed)
 Unable to reach patient. LMTRC

## 2024-06-28 ENCOUNTER — Telehealth: Payer: Self-pay

## 2024-06-28 ENCOUNTER — Telehealth: Payer: Self-pay | Admitting: Hematology and Oncology

## 2024-06-28 NOTE — Telephone Encounter (Signed)
 Copied from CRM #[id number]. Topic: General - Other >> Jun 28, 2024  3:21 PM Carlyon D wrote: Reason for CRM: pt is calling wanting to speak to miss jasmine who he missed a couple calls from. Called [facility] Jasmine is not available, please call pt back when available

## 2024-06-28 NOTE — Telephone Encounter (Signed)
 Called pt regarding scheduling a future appt

## 2024-06-28 NOTE — Telephone Encounter (Signed)
 I have called pt numerous times since last week and have yet to recieve an answer

## 2024-06-28 NOTE — Telephone Encounter (Signed)
 Spoke with the patient about the denial of his medication . He states that he will reach out to insurance company and call us  back.

## 2024-06-28 NOTE — Telephone Encounter (Signed)
 Unable to reach patient [facility]. This is my 3rd and final attempt to reach the patient. I will closed the encounter and if the patient calls back please reopen it.

## 2024-07-04 NOTE — Telephone Encounter (Unsigned)
 Copied from CRM #[id number]. Topic: General - Call Back - No Documentation >> Jul 04, 2024  9:15 AM Avram MATSU wrote: Reason for CRM: patient would like a call from jasmine to talk about his PA, he stated the PA team faxed over a letter, please advise [phone]

## 2024-07-05 NOTE — Telephone Encounter (Signed)
 Unable to reach the patient. Unable to leave message to return the call.

## 2024-07-06 NOTE — Telephone Encounter (Signed)
 Unable to reach the patient. Unable to [facility] [place].

## 2024-07-06 NOTE — Telephone Encounter (Signed)
 Patient returned call to CMA Jaz,as at lunch at time,per [place]. Patient would like a return call after [phone],due to being at work now.

## 2024-07-06 NOTE — Telephone Encounter (Signed)
 Unable to reach patient. Unable to [facility]

## 2024-08-04 ENCOUNTER — Ambulatory Visit: Admitting: Internal Medicine

## 2024-08-11 ENCOUNTER — Ambulatory Visit: Admitting: Podiatry

## 2024-10-14 ENCOUNTER — Inpatient Hospital Stay: Payer: Self-pay | Attending: Hematology and Oncology

## 2024-10-14 ENCOUNTER — Inpatient Hospital Stay: Payer: Self-pay | Admitting: Hematology and Oncology
